# Patient Record
Sex: Male | Born: 1960 | Race: White | Hispanic: No | Marital: Married | Smoking: Former smoker
Health system: Southern US, Community
[De-identification: ages and names within clinical notes are randomized; demographics above are authoritative.]

## PROBLEM LIST (undated history)

## (undated) DIAGNOSIS — K222 Esophageal obstruction: Secondary | ICD-10-CM

## (undated) DIAGNOSIS — Z8249 Family history of ischemic heart disease and other diseases of the circulatory system: Secondary | ICD-10-CM

## (undated) DIAGNOSIS — N200 Calculus of kidney: Secondary | ICD-10-CM

## (undated) DIAGNOSIS — J309 Allergic rhinitis, unspecified: Secondary | ICD-10-CM

## (undated) DIAGNOSIS — I5189 Other ill-defined heart diseases: Secondary | ICD-10-CM

## (undated) DIAGNOSIS — Z87442 Personal history of urinary calculi: Secondary | ICD-10-CM

## (undated) DIAGNOSIS — M1990 Unspecified osteoarthritis, unspecified site: Secondary | ICD-10-CM

## (undated) DIAGNOSIS — G4733 Obstructive sleep apnea (adult) (pediatric): Secondary | ICD-10-CM

## (undated) DIAGNOSIS — K649 Unspecified hemorrhoids: Secondary | ICD-10-CM

## (undated) DIAGNOSIS — K922 Gastrointestinal hemorrhage, unspecified: Secondary | ICD-10-CM

## (undated) DIAGNOSIS — K219 Gastro-esophageal reflux disease without esophagitis: Secondary | ICD-10-CM

## (undated) DIAGNOSIS — I471 Supraventricular tachycardia, unspecified: Secondary | ICD-10-CM

## (undated) DIAGNOSIS — T7840XA Allergy, unspecified, initial encounter: Secondary | ICD-10-CM

## (undated) DIAGNOSIS — I10 Essential (primary) hypertension: Secondary | ICD-10-CM

## (undated) DIAGNOSIS — K5790 Diverticulosis of intestine, part unspecified, without perforation or abscess without bleeding: Secondary | ICD-10-CM

## (undated) DIAGNOSIS — I77810 Thoracic aortic ectasia: Secondary | ICD-10-CM

## (undated) DIAGNOSIS — I4719 Other supraventricular tachycardia: Secondary | ICD-10-CM

## (undated) DIAGNOSIS — Z87891 Personal history of nicotine dependence: Secondary | ICD-10-CM

## (undated) DIAGNOSIS — I499 Cardiac arrhythmia, unspecified: Secondary | ICD-10-CM

## (undated) DIAGNOSIS — E785 Hyperlipidemia, unspecified: Secondary | ICD-10-CM

## (undated) DIAGNOSIS — G4730 Sleep apnea, unspecified: Secondary | ICD-10-CM

## (undated) DIAGNOSIS — I2510 Atherosclerotic heart disease of native coronary artery without angina pectoris: Secondary | ICD-10-CM

## (undated) DIAGNOSIS — M1380 Other specified arthritis, unspecified site: Secondary | ICD-10-CM

## (undated) DIAGNOSIS — E119 Type 2 diabetes mellitus without complications: Secondary | ICD-10-CM

## (undated) DIAGNOSIS — I219 Acute myocardial infarction, unspecified: Secondary | ICD-10-CM

## (undated) DIAGNOSIS — I4891 Unspecified atrial fibrillation: Secondary | ICD-10-CM

## (undated) DIAGNOSIS — K635 Polyp of colon: Secondary | ICD-10-CM

## (undated) HISTORY — DX: Unspecified osteoarthritis, unspecified site: M19.90

## (undated) HISTORY — DX: Other ill-defined heart diseases: I51.89

## (undated) HISTORY — DX: Allergic rhinitis, unspecified: J30.9

## (undated) HISTORY — PX: BARIATRIC SURGERY: SHX1103

## (undated) HISTORY — DX: Obstructive sleep apnea (adult) (pediatric): G47.33

## (undated) HISTORY — DX: Thoracic aortic ectasia: I77.810

## (undated) HISTORY — DX: Family history of ischemic heart disease and other diseases of the circulatory system: Z82.49

## (undated) HISTORY — DX: Unspecified hemorrhoids: K64.9

## (undated) HISTORY — DX: Hyperlipidemia, unspecified: E78.5

## (undated) HISTORY — DX: Morbid (severe) obesity due to excess calories: E66.01

## (undated) HISTORY — DX: Diverticulosis of intestine, part unspecified, without perforation or abscess without bleeding: K57.90

## (undated) HISTORY — DX: Personal history of nicotine dependence: Z87.891

## (undated) HISTORY — DX: Gastro-esophageal reflux disease without esophagitis: K21.9

## (undated) HISTORY — DX: Type 2 diabetes mellitus without complications: E11.9

## (undated) HISTORY — DX: Polyp of colon: K63.5

## (undated) HISTORY — PX: CARPAL TUNNEL RELEASE: SHX101

## (undated) HISTORY — DX: Calculus of kidney: N20.0

## (undated) HISTORY — DX: Atherosclerotic heart disease of native coronary artery without angina pectoris: I25.10

## (undated) HISTORY — DX: Allergy, unspecified, initial encounter: T78.40XA

## (undated) HISTORY — DX: Supraventricular tachycardia: I47.1

## (undated) HISTORY — DX: Supraventricular tachycardia, unspecified: I47.10

## (undated) HISTORY — DX: Sleep apnea, unspecified: G47.30

## (undated) HISTORY — PX: MOUTH SURGERY: SHX715

## (undated) HISTORY — PX: CHOLECYSTECTOMY: SHX55

## (undated) HISTORY — PX: TONSILLECTOMY: SHX5217

## (undated) HISTORY — DX: Other specified arthritis, unspecified site: M13.80

---

## 1997-08-25 HISTORY — PX: HEMORRHOID SURGERY: SHX153

## 2002-04-21 ENCOUNTER — Encounter: Payer: Self-pay | Admitting: Internal Medicine

## 2002-04-21 ENCOUNTER — Emergency Department (HOSPITAL_COMMUNITY): Admission: EM | Admit: 2002-04-21 | Discharge: 2002-04-21 | Payer: Self-pay | Admitting: Internal Medicine

## 2005-06-27 ENCOUNTER — Ambulatory Visit: Payer: Self-pay | Admitting: Gastroenterology

## 2005-06-27 HISTORY — PX: COLONOSCOPY: SHX174

## 2005-06-27 HISTORY — PX: ESOPHAGOGASTRODUODENOSCOPY: SHX1529

## 2005-10-01 ENCOUNTER — Ambulatory Visit: Payer: Self-pay | Admitting: Orthopedic Surgery

## 2008-02-24 ENCOUNTER — Ambulatory Visit: Payer: Self-pay | Admitting: General Practice

## 2008-03-25 ENCOUNTER — Ambulatory Visit: Payer: Self-pay | Admitting: General Practice

## 2008-08-08 ENCOUNTER — Emergency Department (HOSPITAL_COMMUNITY): Admission: EM | Admit: 2008-08-08 | Discharge: 2008-08-08 | Payer: Self-pay | Admitting: Emergency Medicine

## 2008-08-31 ENCOUNTER — Emergency Department (HOSPITAL_COMMUNITY): Admission: EM | Admit: 2008-08-31 | Discharge: 2008-08-31 | Payer: Self-pay | Admitting: Emergency Medicine

## 2008-09-08 ENCOUNTER — Ambulatory Visit: Payer: Self-pay

## 2008-12-12 ENCOUNTER — Ambulatory Visit: Payer: Self-pay | Admitting: Family Medicine

## 2009-05-02 ENCOUNTER — Emergency Department: Payer: Self-pay | Admitting: Emergency Medicine

## 2009-11-24 ENCOUNTER — Emergency Department (HOSPITAL_COMMUNITY): Admission: EM | Admit: 2009-11-24 | Discharge: 2009-11-24 | Payer: Self-pay | Admitting: Emergency Medicine

## 2009-11-28 ENCOUNTER — Ambulatory Visit (HOSPITAL_COMMUNITY): Admission: RE | Admit: 2009-11-28 | Discharge: 2009-11-28 | Payer: Self-pay | Admitting: Urology

## 2009-12-03 ENCOUNTER — Ambulatory Visit (HOSPITAL_COMMUNITY): Admission: RE | Admit: 2009-12-03 | Discharge: 2009-12-03 | Payer: Self-pay | Admitting: Urology

## 2009-12-13 ENCOUNTER — Ambulatory Visit: Payer: Self-pay

## 2010-03-22 ENCOUNTER — Ambulatory Visit: Payer: Self-pay

## 2010-03-25 ENCOUNTER — Ambulatory Visit: Payer: Self-pay

## 2010-04-01 ENCOUNTER — Ambulatory Visit: Payer: Self-pay | Admitting: Specialist

## 2010-04-25 ENCOUNTER — Ambulatory Visit: Payer: Self-pay | Admitting: Specialist

## 2010-08-01 ENCOUNTER — Ambulatory Visit: Payer: Self-pay | Admitting: Specialist

## 2010-11-13 LAB — URINE MICROSCOPIC-ADD ON

## 2010-11-13 LAB — URINALYSIS, ROUTINE W REFLEX MICROSCOPIC
Bilirubin Urine: NEGATIVE
Bilirubin Urine: NEGATIVE
Glucose, UA: NEGATIVE mg/dL
Ketones, ur: NEGATIVE mg/dL
Leukocytes, UA: NEGATIVE
Nitrite: NEGATIVE
Protein, ur: NEGATIVE mg/dL
Specific Gravity, Urine: >1.03 — ABNORMAL HIGH (ref 1.005–1.030)
Specific Gravity, Urine: >1.03 — ABNORMAL HIGH (ref 1.005–1.030)
Urobilinogen, UA: 0.2 mg/dL (ref 0.0–1.0)
pH: 6 (ref 5.0–8.0)
pH: 6 (ref 5.0–8.0)

## 2011-05-29 LAB — URINALYSIS, ROUTINE W REFLEX MICROSCOPIC
Ketones, ur: NEGATIVE mg/dL
Leukocytes, UA: NEGATIVE
Nitrite: NEGATIVE
Protein, ur: NEGATIVE mg/dL

## 2011-05-29 LAB — URINE MICROSCOPIC-ADD ON

## 2011-09-12 ENCOUNTER — Ambulatory Visit: Payer: Self-pay | Admitting: General Practice

## 2014-06-19 ENCOUNTER — Encounter (INDEPENDENT_AMBULATORY_CARE_PROVIDER_SITE_OTHER): Payer: Self-pay

## 2014-06-19 ENCOUNTER — Ambulatory Visit (INDEPENDENT_AMBULATORY_CARE_PROVIDER_SITE_OTHER): Payer: 59 | Admitting: Cardiovascular Disease

## 2014-06-19 ENCOUNTER — Encounter: Payer: Self-pay | Admitting: Cardiovascular Disease

## 2014-06-19 VITALS — BP 140/90 | HR 93 | Ht 74.0 in | Wt 324.2 lb

## 2014-06-19 DIAGNOSIS — E669 Obesity, unspecified: Secondary | ICD-10-CM | POA: Insufficient documentation

## 2014-06-19 DIAGNOSIS — E785 Hyperlipidemia, unspecified: Secondary | ICD-10-CM | POA: Insufficient documentation

## 2014-06-19 DIAGNOSIS — R0602 Shortness of breath: Secondary | ICD-10-CM | POA: Insufficient documentation

## 2014-06-19 DIAGNOSIS — G4733 Obstructive sleep apnea (adult) (pediatric): Secondary | ICD-10-CM

## 2014-06-19 DIAGNOSIS — R079 Chest pain, unspecified: Secondary | ICD-10-CM | POA: Insufficient documentation

## 2014-06-19 DIAGNOSIS — Z9989 Dependence on other enabling machines and devices: Secondary | ICD-10-CM

## 2014-06-19 NOTE — Assessment & Plan Note (Signed)
History of sleep apnea, compliant on his CPAP

## 2014-06-19 NOTE — Patient Instructions (Addendum)
No medication changes were made.  Your EKG and exam are good today!  We will order a stress test for chest pain and shortness of breath No caffeine  For 24 hours before the test No food the morning of the test  [facility] MYOVIEW  Your caregiver has ordered a Stress Test with nuclear imaging. The purpose of this test is to evaluate the blood supply to your heart muscle. This procedure is referred to as a "Non-Invasive Stress Test." This is because other than having an IV started in your vein, nothing is inserted or "invades" your body. Cardiac stress tests are done to find areas of poor blood flow to the heart by determining the extent of coronary artery disease (CAD). Some patients exercise on a treadmill, which naturally increases the blood flow to your heart, while others who are  unable to walk on a treadmill due to physical limitations have a pharmacologic/chemical stress agent called Lexiscan . This medicine will mimic walking on a treadmill by temporarily increasing your coronary blood flow.   Please note: these test may take anywhere between 2-4 hours to complete  PLEASE REPORT TO [facility]  THE VOLUNTEERS AT THE FIRST DESK WILL DIRECT YOU WHERE TO GO  Date of Procedure:06/27/14 and 11/4/15_____________________________________  Arrival Time for Procedure:_____________7:45 am_________________  PLEASE NOTIFY THE OFFICE AT LEAST 24 HOURS IN ADVANCE IF YOU ARE UNABLE TO KEEP YOUR APPOINTMENT.  [phone] AND  PLEASE NOTIFY NUCLEAR MEDICINE AT [facility] AT LEAST 24 HOURS IN ADVANCE IF YOU ARE UNABLE TO KEEP YOUR APPOINTMENT. [phone]  How to prepare for your Myoview test:  1. Do not eat or drink after midnight 2. No caffeine for 24 hours prior to test 3. No smoking 24 hours prior to test. 4. Your medication may be taken with water.  If your doctor stopped a medication because of this test, do not take that medication. 5. Ladies, please do not wear dresses.  Skirts or  pants are appropriate. Please wear a short sleeve shirt. 6. No perfume, cologne or lotion. 7. Wear comfortable walking shoes. No heels!            Please call us if you have new issues that need to be addressed before your next appt.   Your next appointment will be scheduled in our new office located at :  [facility]  [address], [address]  [place], [place] [id number]

## 2014-06-19 NOTE — Assessment & Plan Note (Signed)
We have encouraged continued exercise, careful diet management in an effort to lose weight. 

## 2014-06-19 NOTE — Assessment & Plan Note (Signed)
Etiology unclear. Some typical and atypical features. We will order a Myoview as he is unable to walk very far secondary to arthritis pain. He will need a 2 day study. High risk of coronary artery disease given his smoking history, significant family history

## 2014-06-19 NOTE — Progress Notes (Signed)
Patient ID: Hector BolognaGary K Curry, male    DOB: May 29, 1961, 53 y.o.   MRN: [id number]  HPI Comments: Hector Curry is a pleasant 53 year old male with history of obesity, LAP-BAND 3 years ago, obstructive sleep apnea who wears CPAP, 15 years of smoking who stopped several years ago, strong family history of coronary artery disease with father who passed away from MI in his late 5940s, who presents to establish care in the [place].  He reports that he has had chest pain. Symptoms typically presenting at nighttime, sometimes in the day. Symptoms are rare but will wake him up from sleep. Symptoms will last for quite some time, typically resolve after getting up, taking an aspirin. He has not been exercising secondary to chronic joint issues. No radiation of his pain to arms or jaw. He spends most of his time working, sitting, works for [employer]. He does report shortness of breath with exertion. Reports that since the LAP band and since he stopped smoking,  his weight has climbed significantly, possibly 100 pounds  5-7 years ago he reports having a stress test and echocardiogram which were reportedly normal He does not check his blood pressure on a regular basis Lab work from 2012 showed LDL 139, HDL 31, triglycerides 156, total cholesterol 201, vitamin D was low No recent lab work since his weight has trended upwards  EKG shows normal sinus rhythm with rate 93 beats per minute, no significant ST or T wave changes   Outpatient Encounter Prescriptions as of 06/19/2014  Medication Sig  . aspirin 81 MG tablet Take 81 mg by mouth daily.  . cholecalciferol (VITAMIN D) 1000 UNITS tablet Take 1,000 Units by mouth daily.  . Omega-3 Fatty Acids (FISH OIL) 1000 MG CAPS Take 1,000 mg by mouth daily.  . vitamin E 400 UNIT capsule Take 400 Units by mouth daily.    Review of Systems  Constitutional: Negative.   HENT: Negative.   Eyes: Negative.   Respiratory: Positive for shortness of breath.    Cardiovascular: Positive for chest pain.  Gastrointestinal: Negative.   Endocrine: Negative.   Musculoskeletal: Negative.   Skin: Negative.   Allergic/Immunologic: Negative.   Neurological: Negative.   Hematological: Negative.   Psychiatric/Behavioral: Negative.   All other systems reviewed and are negative. BP 140/90  Pulse 93  Ht 6\' 2"  (1.88 m)  Wt 324 lb 4 oz (147.079 kg)  BMI 41.61 kg/m2  Physical Exam  Nursing note and vitals reviewed. Constitutional: He is oriented to person, place, and time. He appears well-developed and well-nourished.  HENT:  Head: Normocephalic.  Nose: Nose normal.  Mouth/Throat: Oropharynx is clear and moist.  Eyes: Conjunctivae are normal. Pupils are equal, round, and reactive to light.  Neck: Normal range of motion. Neck supple. No JVD present.  Cardiovascular: Normal rate, regular rhythm, S1 normal, S2 normal, normal heart sounds and intact distal pulses.  Exam reveals no gallop and no friction rub.   No murmur heard. Pulmonary/Chest: Effort normal and breath sounds normal. No respiratory distress. He has no wheezes. He has no rales. He exhibits no tenderness.  Abdominal: Soft. Bowel sounds are normal. He exhibits no distension. There is no tenderness.  Musculoskeletal: Normal range of motion. He exhibits no edema and no tenderness.  Lymphadenopathy:    He has no cervical adenopathy.  Neurological: He is alert and oriented to person, place, and time. Coordination normal.  Skin: Skin is warm and dry. No rash noted. No erythema.  Psychiatric: He has a  normal mood and affect. His behavior is normal. Judgment and thought content normal.      Assessment and Plan

## 2014-06-19 NOTE — Assessment & Plan Note (Signed)
Shortness of breath with exertion. Stress test has been ordered. Unable to exclude ischemia versus deconditioning, obesity.

## 2014-06-19 NOTE — Assessment & Plan Note (Signed)
Cholesterol has likely climbed since 2012 given his weight gain. He is scheduled to see Dr. Dossie Arbourrissman for routine exam and lab work. We'll try to obtain this lab work when it becomes available. He may need a statin

## 2014-06-27 ENCOUNTER — Ambulatory Visit: Payer: Self-pay | Admitting: Cardiovascular Disease

## 2014-06-27 DIAGNOSIS — R079 Chest pain, unspecified: Secondary | ICD-10-CM

## 2014-06-28 ENCOUNTER — Encounter: Payer: Self-pay | Admitting: Cardiovascular Disease

## 2014-06-28 ENCOUNTER — Other Ambulatory Visit: Payer: Self-pay | Admitting: *Deleted

## 2014-06-28 DIAGNOSIS — R079 Chest pain, unspecified: Secondary | ICD-10-CM

## 2014-06-28 DIAGNOSIS — R0602 Shortness of breath: Secondary | ICD-10-CM

## 2014-08-07 DIAGNOSIS — I10 Essential (primary) hypertension: Secondary | ICD-10-CM | POA: Insufficient documentation

## 2014-10-03 ENCOUNTER — Ambulatory Visit: Payer: Self-pay | Admitting: Gastroenterology

## 2014-10-03 DIAGNOSIS — K635 Polyp of colon: Secondary | ICD-10-CM

## 2014-10-03 DIAGNOSIS — K5790 Diverticulosis of intestine, part unspecified, without perforation or abscess without bleeding: Secondary | ICD-10-CM | POA: Insufficient documentation

## 2014-10-03 HISTORY — PX: COLONOSCOPY: SHX174

## 2014-10-03 HISTORY — DX: Polyp of colon: K63.5

## 2014-10-03 HISTORY — DX: Diverticulosis of intestine, part unspecified, without perforation or abscess without bleeding: K57.90

## 2014-10-20 DIAGNOSIS — M65839 Other synovitis and tenosynovitis, unspecified forearm: Secondary | ICD-10-CM | POA: Insufficient documentation

## 2014-12-18 LAB — SURGICAL PATHOLOGY

## 2015-04-26 ENCOUNTER — Emergency Department (HOSPITAL_COMMUNITY): Payer: 59

## 2015-04-26 ENCOUNTER — Encounter (HOSPITAL_COMMUNITY): Payer: Self-pay | Admitting: Emergency Medicine

## 2015-04-26 ENCOUNTER — Emergency Department (HOSPITAL_COMMUNITY)
Admission: EM | Admit: 2015-04-26 | Discharge: 2015-04-26 | Disposition: A | Discharge status: Home / Self Care | Payer: 59 | Attending: Emergency Medicine | Admitting: Emergency Medicine

## 2015-04-26 ENCOUNTER — Telehealth: Payer: Self-pay

## 2015-04-26 DIAGNOSIS — I498 Other specified cardiac arrhythmias: Secondary | ICD-10-CM

## 2015-04-26 DIAGNOSIS — I4891 Unspecified atrial fibrillation: Secondary | ICD-10-CM | POA: Insufficient documentation

## 2015-04-26 DIAGNOSIS — E669 Obesity, unspecified: Secondary | ICD-10-CM | POA: Diagnosis not present

## 2015-04-26 DIAGNOSIS — Z79899 Other long term (current) drug therapy: Secondary | ICD-10-CM | POA: Diagnosis not present

## 2015-04-26 DIAGNOSIS — I10 Essential (primary) hypertension: Secondary | ICD-10-CM | POA: Insufficient documentation

## 2015-04-26 DIAGNOSIS — Z87891 Personal history of nicotine dependence: Secondary | ICD-10-CM | POA: Diagnosis not present

## 2015-04-26 DIAGNOSIS — R0789 Other chest pain: Secondary | ICD-10-CM | POA: Diagnosis not present

## 2015-04-26 DIAGNOSIS — Z7982 Long term (current) use of aspirin: Secondary | ICD-10-CM | POA: Insufficient documentation

## 2015-04-26 DIAGNOSIS — R200 Anesthesia of skin: Secondary | ICD-10-CM

## 2015-04-26 DIAGNOSIS — Z8639 Personal history of other endocrine, nutritional and metabolic disease: Secondary | ICD-10-CM | POA: Diagnosis not present

## 2015-04-26 DIAGNOSIS — R002 Palpitations: Secondary | ICD-10-CM

## 2015-04-26 DIAGNOSIS — R079 Chest pain, unspecified: Secondary | ICD-10-CM | POA: Diagnosis present

## 2015-04-26 HISTORY — DX: Essential (primary) hypertension: I10

## 2015-04-26 LAB — BASIC METABOLIC PANEL
Anion gap: 9 (ref 5–15)
BUN: 11 mg/dL (ref 6–20)
CHLORIDE: 97 mmol/L — AB (ref 101–111)
CO2: 29 mmol/L (ref 22–32)
Calcium: 8.8 mg/dL — ABNORMAL LOW (ref 8.9–10.3)
Creatinine, Ser: 0.86 mg/dL (ref 0.61–1.24)
GFR calc Af Amer: >60 mL/min (ref >60)
GFR calc non Af Amer: >60 mL/min (ref >60)
GLUCOSE: 154 mg/dL — AB (ref 65–99)
POTASSIUM: 3.4 mmol/L — AB (ref 3.5–5.1)
SODIUM: 135 mmol/L (ref 135–145)

## 2015-04-26 LAB — CBC
HEMATOCRIT: 47.2 % (ref 39.0–52.0)
Hemoglobin: 16.2 g/dL (ref 13.0–17.0)
MCH: 30.8 pg (ref 26.0–34.0)
MCHC: 34.3 g/dL (ref 30.0–36.0)
MCV: 89.7 fL (ref 78.0–100.0)
Platelets: 171 10*3/uL (ref 150–400)
RBC: 5.26 MIL/uL (ref 4.22–5.81)
RDW: 13.7 % (ref 11.5–15.5)
WBC: 7.7 10*3/uL (ref 4.0–10.5)

## 2015-04-26 LAB — TROPONIN I
Troponin I: <0.03 ng/mL (ref <0.031)
Troponin I: <0.03 ng/mL (ref <0.031)

## 2015-04-26 MED ORDER — ASPIRIN 81 MG PO CHEW
324.0000 mg | CHEWABLE_TABLET | Freq: Once | ORAL | Status: DC
  Filled 2015-04-26: qty 4

## 2015-04-26 MED ORDER — SODIUM CHLORIDE 0.9 % IV SOLN
INTRAVENOUS | Status: AC
  Filled 2015-04-26: qty 150

## 2015-04-26 MED ORDER — SODIUM CHLORIDE 0.9 % IJ SOLN
INTRAMUSCULAR | Status: AC
  Filled 2015-04-26: qty 15

## 2015-04-26 MED ORDER — ACETAMINOPHEN 500 MG PO TABS
1000.0000 mg | ORAL_TABLET | Freq: Once | ORAL | Status: AC
  Administered 2015-04-26: 1000 mg via ORAL
  Filled 2015-04-26: qty 2

## 2015-04-26 MED ORDER — NITROGLYCERIN 2 % TD OINT
1.0000 [in_us] | TOPICAL_OINTMENT | Freq: Once | TRANSDERMAL | Status: AC
  Administered 2015-04-26: 1 [in_us] via TOPICAL
  Filled 2015-04-26: qty 1

## 2015-04-26 NOTE — ED Provider Notes (Signed)
CSN: [id number]     Arrival date & time 04/26/15  0458 History   First MD Initiated Contact with Patient 04/26/15 0515    Chief Complaint  Patient presents with  . Chest Pain     (Consider location/radiation/quality/duration/timing/severity/associated sxs/prior Treatment) HPI  Patient reports she has had a constant feeling that his heart is fluttering for the past 2 weeks. He states it was fluttering when he went to bed last night at 9 PM. He denies any chest pain, shortness of breath, lightheadedness or dizziness with the fluttering. He states at 4:30 this morning while making the bed which is his usual activity he started getting chest discomfort just left of the center of his chest that lasted a few minutes. He had numbness and tingling in his left arm during that same time. He has a second episode about 15 minutes later and got clammy and felt very weak. He had a third episode while driving to the emergency department. He denies any nausea, vomiting, shortness of breath with these episodes. He has not had  had these episodes before. He states he sees a cardiologist for control of his blood pressure, however he does not know what his blood pressure medication is. He states he has a family history of heart disease and is father who died at age 3 from heart disease.  PCP Dr Judithann Sheen, [facility] Dr Lewie Loron with [place] for hypertension  Past Medical History  Diagnosis Date  . Hyperlipidemia   . Hypertension   . CPAP (continuous positive airway pressure) dependence    Past Surgical History  Procedure Laterality Date  . Bariatric surgery      lap band   . Cholecystectomy     Family History  Problem Relation Age of Onset  . Heart attack Father 41  . Hypertension Sister    Social History  Substance Use Topics  . Smoking status: Former Smoker -- 1.00 packs/day for 15 years    Types: Cigarettes    Quit date: 08/05/1989  . Smokeless tobacco: None  . Alcohol Use: 0.6  oz/week    1 Glasses of wine per week  employed Lives at home Lives with spouse  Review of Systems  All other systems reviewed and are negative.     Allergies  Review of patient's allergies indicates no known allergies.  Home Medications   Prior to Admission medications   Medication Sig Start Date End Date Taking? Authorizing Provider  bisoprolol (ZEBETA) 10 MG tablet Take 20 mg by mouth daily.   Yes Historical Provider, MD  aspirin 81 MG tablet Take 81 mg by mouth daily.    Historical Provider, MD  cholecalciferol (VITAMIN D) 1000 UNITS tablet Take 1,000 Units by mouth daily.    Historical Provider, MD  Omega-3 Fatty Acids (FISH OIL) 1000 MG CAPS Take 1,000 mg by mouth daily.    Historical Provider, MD  vitamin E 400 UNIT capsule Take 400 Units by mouth daily.    Historical Provider, MD   BP 130/90 mmHg  Pulse 74  Temp(Src) 97.6 F (36.4 C)  Resp 19  Ht  (1.88 m)  Wt 320 lb (145.151 kg)  BMI 41.07 kg/m2  SpO2 99%  Vital signs normal   Physical Exam  Constitutional: He is oriented to person, place, and time. He appears well-developed and well-nourished.  Non-toxic appearance. He does not appear ill. No distress.  obese  HENT:  Head: Normocephalic and atraumatic.  Right Ear: External ear normal.  Left  Ear: External ear normal.  Nose: Nose normal. No mucosal edema or rhinorrhea.  Mouth/Throat: Oropharynx is clear and moist and mucous membranes are normal. No dental abscesses or uvula swelling.  Eyes: Conjunctivae and EOM are normal. Pupils are equal, round, and reactive to light.  Neck: Normal range of motion and full passive range of motion without pain. Neck supple.  Cardiovascular: Normal rate, regular rhythm and normal heart sounds.  Exam reveals no gallop and no friction rub.   No murmur heard. Pulmonary/Chest: Effort normal and breath sounds normal. No respiratory distress. He has no wheezes. He has no rhonchi. He has no rales. He exhibits no tenderness and  no crepitus.  Abdominal: Soft. Normal appearance and bowel sounds are normal. He exhibits no distension. There is no tenderness. There is no rebound and no guarding.  Musculoskeletal: Normal range of motion. He exhibits no edema or tenderness.  Moves all extremities well.   Neurological: He is alert and oriented to person, place, and time. He has normal strength. No cranial nerve deficit.  Skin: Skin is warm, dry and intact. No rash noted. No erythema. No pallor.  Psychiatric: He has a normal mood and affect. His speech is normal and behavior is normal. His mood appears not anxious.  Nursing note and vitals reviewed.   ED Course  Procedures (including critical care time)  Medications  aspirin chewable tablet 324 mg (0 mg Oral Hold 04/26/15 0540)  sodium chloride 0.9 % infusion (not administered)  sodium chloride 0.9 % injection (not administered)  nitroGLYCERIN (NITROGLYN) 2 % ointment 1 inch (1 inch Topical Given 04/26/15 0534)  acetaminophen (TYLENOL) tablet 1,000 mg (1,000 mg Oral Given 04/26/15 0534)    Recheck at 6:45 AM. Wife states patient seems more like his normal self. He now states he has been having some intermittent episodes of numbness that starts in the left side of his head and goes down his left body with the chest discomfort he was telling me about. He states the chest comfort now was more of a dull pain. Patient is agreeable to getting MRI/MRA. His second troponin will be done at 9 AM which will be a 5 hour delta troponin. He states he continues to have "fluttering" in his chest that he states is constant. He is noted to have isolated PACs on his monitor.  8:00 am patient turned over to Dr Rubin Payor at change of shift to get delta troponin and his MRA/MRI results.   Labs Review Results for orders placed or performed during the hospital encounter of 04/26/15  Basic metabolic panel  Result Value Ref Range   Sodium 135 135 - 145 mmol/L   Potassium 3.4 (L) 3.5 - 5.1 mmol/L    Chloride 97 (L) 101 - 111 mmol/L   CO2 29 22 - 32 mmol/L   Glucose, Bld 154 (H) 65 - 99 mg/dL   BUN 11 6 - 20 mg/dL   Creatinine, Ser 9.56 0.61 - 1.24 mg/dL   Calcium 8.8 (L) 8.9 - 10.3 mg/dL   GFR calc non Af Amer >60 >60 mL/min   GFR calc Af Amer >60 >60 mL/min   Anion gap 9 5 - 15  CBC  Result Value Ref Range   WBC 7.7 4.0 - 10.5 K/uL   RBC 5.26 4.22 - 5.81 MIL/uL   Hemoglobin 16.2 13.0 - 17.0 g/dL   HCT 21.3 08.6 - 57.8 %   MCV 89.7 78.0 - 100.0 fL   MCH 30.8 26.0 - 34.0 pg  MCHC 34.3 30.0 - 36.0 g/dL   RDW 16.1 09.6 - 04.5 %   Platelets 171 150 - 400 K/uL  Troponin I  Result Value Ref Range   Troponin I <0.03 <0.031 ng/mL   Laboratory interpretation all normal except mild hypokalemia    Imaging Review Dg Chest 2 View  04/26/2015   CLINICAL DATA:  Chest pain and dyspnea onset this morning  EXAM: CHEST  2 VIEW  COMPARISON:  09/12/2011  FINDINGS: The heart size and mediastinal contours are within normal limits. Both lungs are clear. The visualized skeletal structures are unremarkable.  IMPRESSION: No active cardiopulmonary disease.   Electronically Signed   By: Ellery Plunk M.D.   On: 04/26/2015 06:37   I have personally reviewed and evaluated these images and lab results as part of my medical decision-making.   EKG Interpretation   Date/Time:  Thursday April 26 2015 40:98:11 EDT Ventricular Rate:  82 PR Interval:  182 QRS Duration: 131 QT Interval:  412 QTC Calculation: 481 R Axis:   94 Text Interpretation:  Sinus rhythm Supraventricular bigeminy Nonspecific  intraventricular conduction delay No significant change since last tracing  25 Mar 2010 Confirmed by [facility]  MD-I, Eilam Shrewsbury ([id number]) on 04/26/2015 5:24:06 AM      MDM   Final diagnoses:  Numbness on left side  Chest pain, atypical  Fluttering heart    disposition pending  Devoria Albe, MD, Concha Pyo, MD 04/26/15 [phone]

## 2015-04-26 NOTE — ED Notes (Signed)
Pt c/o chest tightness that radiates down left arm with dizziness and weakness.

## 2015-04-26 NOTE — Discharge Instructions (Signed)
Paresthesia Paresthesia is an abnormal burning or prickling sensation. This sensation is generally felt in the hands, arms, legs, or feet. However, it may occur in any part of the body. It is usually not painful. The feeling may be described as:  Tingling or numbness.  "Pins and needles."  Skin crawling.  Buzzing.  Limbs "falling asleep."  Itching. Most people experience temporary (transient) paresthesia at some time in their lives. CAUSES  Paresthesia may occur when you breathe too quickly (hyperventilation). It can also occur without any apparent cause. Commonly, paresthesia occurs when pressure is placed on a nerve. The feeling quickly goes away once the pressure is removed. For some people, however, paresthesia is a long-lasting (chronic) condition caused by an underlying disorder. The underlying disorder may be:  A traumatic, direct injury to nerves. Examples include a:  Broken (fractured) neck.  Fractured skull.  A disorder affecting the brain and spinal cord (central nervous system). Examples include:  Transverse myelitis.  Encephalitis.  Transient ischemic attack.  Multiple sclerosis.  Stroke.  Tumor or blood vessel problems, such as an arteriovenous malformation pressing against the brain or spinal cord.  A condition that damages the peripheral nerves (peripheral neuropathy). Peripheral nerves are not part of the brain and spinal cord. These conditions include:  Diabetes.  Peripheral vascular disease.  Nerve entrapment syndromes, such as carpal tunnel syndrome.  Shingles.  Hypothyroidism.  Vitamin B12 deficiencies.  Alcoholism.  Heavy metal poisoning (lead, arsenic).  Rheumatoid arthritis.  Systemic lupus erythematosus. DIAGNOSIS  Your caregiver will attempt to find the underlying cause of your paresthesia. Your caregiver may:  Take your medical history.  Perform a physical exam.  Order various lab tests.  Order imaging tests. TREATMENT    Treatment for paresthesia depends on the underlying cause. HOME CARE INSTRUCTIONS  Avoid drinking alcohol.  You may consider massage or acupuncture to help relieve your symptoms.  Keep all follow-up appointments as directed by your caregiver. SEEK IMMEDIATE MEDICAL CARE IF:   You feel weak.  You have trouble walking or moving.  You have problems with speech or vision.  You feel confused.  You cannot control your bladder or bowel movements.  You feel numbness after an injury.  You faint.  Your burning or prickling feeling gets worse when walking.  You have pain, cramps, or dizziness.  You develop a rash. MAKE SURE YOU:  Understand these instructions.  Will watch your condition.  Will get help right away if you are not doing well or get worse. Document Released: 08/01/2002 Document Revised: 11/03/2011 Document Reviewed: 05/02/2011 [facility] Patient Information 2015 [place], [place]. This information is not intended to replace advice given to you by your health care provider. Make sure you discuss any questions you have with your health care provider. Palpitations A palpitation is the feeling that your heartbeat is irregular or is faster than normal. It may feel like your heart is fluttering or skipping a beat. Palpitations are usually not a serious problem. However, in some cases, you may need further medical evaluation. CAUSES  Palpitations can be caused by:  Smoking.  Caffeine or other stimulants, such as diet pills or energy drinks.  Alcohol.  Stress and anxiety.  Strenuous physical activity.  Fatigue.  Certain medicines.  Heart disease, especially if you have a history of irregular heart rhythms (arrhythmias), such as atrial fibrillation, atrial flutter, or supraventricular tachycardia.  An improperly working pacemaker or defibrillator. DIAGNOSIS  To find the cause of your palpitations, your health care provider will  take your medical history and perform  a physical exam. Your health care provider may also have you take a test called an ambulatory electrocardiogram (ECG). An ECG records your heartbeat patterns over a 24-hour period. You may also have other tests, such as:  Transthoracic echocardiogram (TTE). During echocardiography, sound waves are used to evaluate how blood flows through your heart.  Transesophageal echocardiogram (TEE).  Cardiac monitoring. This allows your health care provider to monitor your heart rate and rhythm in real time.  Holter monitor. This is a portable device that records your heartbeat and can help diagnose heart arrhythmias. It allows your health care provider to track your heart activity for several days, if needed.  Stress tests by exercise or by giving medicine that makes the heart beat faster. TREATMENT  Treatment of palpitations depends on the cause of your symptoms and can vary greatly. Most cases of palpitations do not require any treatment other than time, relaxation, and monitoring your symptoms. Other causes, such as atrial fibrillation, atrial flutter, or supraventricular tachycardia, usually require further treatment. HOME CARE INSTRUCTIONS   Avoid:  Caffeinated coffee, tea, soft drinks, diet pills, and energy drinks.  Chocolate.  Alcohol.  Stop smoking if you smoke.  Reduce your stress and anxiety. Things that can help you relax include:  A method of controlling things in your body, such as your heartbeats, with your mind (biofeedback).  Yoga.  Meditation.  Physical activity such as swimming, jogging, or walking.  Get plenty of rest and sleep. SEEK MEDICAL CARE IF:   You continue to have a fast or irregular heartbeat beyond 24 hours.  Your palpitations occur more often. SEEK IMMEDIATE MEDICAL CARE IF:  You have chest pain or shortness of breath.  You have a severe headache.  You feel dizzy or you faint. MAKE SURE YOU:  Understand these instructions.  Will watch your  condition.  Will get help right away if you are not doing well or get worse. Document Released: 08/08/2000 Document Revised: 08/16/2013 Document Reviewed: 10/10/2011 [facility] Patient Information 2015 [place], [place]. This information is not intended to replace advice given to you by your health care provider. Make sure you discuss any questions you have with your health care provider.

## 2015-04-26 NOTE — Telephone Encounter (Signed)
Pt was seen this morning at [facility] ED with palpitations, chest heaviness, left side numb, light headed, dizzy. ED r/o heart attack and stroke per wife, states when pt tries to lay down his heart "races". Please call. ED informed him to call Dr. Mariah Milling.

## 2015-04-26 NOTE — ED Notes (Signed)
Asa not given because patient took asa prior  arrival  to er per patient which were  total

## 2015-04-26 NOTE — ED Provider Notes (Signed)
  Physical Exam  BP 117/63 mmHg  Pulse 70  Temp(Src) 97.6 F (36.4 C)  Resp 17  Ht  (1.88 m)  Wt 320 lb (145.151 kg)  BMI 41.07 kg/m2  SpO2 97%  Physical Exam  ED Course  Procedures  MDM Patient with palpitations and numbness. EKG reassuring enzymes negative 2. MRI does not show stroke. Will discharge home.      Benjiman Core, MD 04/26/15 [phone]

## 2015-04-26 NOTE — Telephone Encounter (Signed)
Where can we put him for an ED f/u?

## 2015-04-26 NOTE — Telephone Encounter (Signed)
EKG from the hospital reviewed showing APCs, typically a benign rhythm If he is concerned about other arrhythmia, could wear a 2 day monitor Would schedule in clinic, first available after monitor If heart is racing, would take extra beta blocker as needed He is currently taking beta blocker daily

## 2015-04-27 ENCOUNTER — Ambulatory Visit (INDEPENDENT_AMBULATORY_CARE_PROVIDER_SITE_OTHER): Payer: 59

## 2015-04-27 DIAGNOSIS — R002 Palpitations: Secondary | ICD-10-CM

## 2015-04-27 NOTE — Telephone Encounter (Signed)
S/w pt wife who states pt heart still fluttering and having difficulty sleeping.  Informed pt wife of Dr. Windell Hummingbird recommendations. Wife states she will call him about coming in today for holter monitor Wife verbalized understanding with no further questions.

## 2015-05-02 ENCOUNTER — Telehealth: Payer: Self-pay | Admitting: Cardiovascular Disease

## 2015-05-02 ENCOUNTER — Encounter: Payer: Self-pay | Admitting: Physician Assistant

## 2015-05-02 DIAGNOSIS — R002 Palpitations: Secondary | ICD-10-CM | POA: Insufficient documentation

## 2015-05-02 DIAGNOSIS — G4733 Obstructive sleep apnea (adult) (pediatric): Secondary | ICD-10-CM | POA: Insufficient documentation

## 2015-05-02 DIAGNOSIS — Z8249 Family history of ischemic heart disease and other diseases of the circulatory system: Secondary | ICD-10-CM | POA: Insufficient documentation

## 2015-05-02 DIAGNOSIS — Z87891 Personal history of nicotine dependence: Secondary | ICD-10-CM | POA: Insufficient documentation

## 2015-05-02 NOTE — Telephone Encounter (Signed)
Dr. Mariah Milling has not resulted to me yet w/ his recommendations.

## 2015-05-02 NOTE — Telephone Encounter (Signed)
1) Mandi - Please see resulted Holter to review with patient  2) Victorino Dike - Ok to place on schedule on 9/9 at 1:30

## 2015-05-02 NOTE — Telephone Encounter (Signed)
Thanks [employer], Will Fwd to nancy to open schedule and call patient to confirm.

## 2015-05-02 NOTE — Telephone Encounter (Signed)
Patient wants to know when to expect holter results.  Please call.    Patient also wants to be seen by [facility] ASAP .  Any suggestions?

## 2015-05-02 NOTE — Telephone Encounter (Signed)
He was in the ED for chest pain on 9/1.  Is there anywhere we can squeeze him in?

## 2015-05-02 NOTE — Telephone Encounter (Signed)
Hector Curry is unassigned on Friday so his schedule is blocked.  Will Fwd to him to see if he can fit in that day.. You think the 48hr holter will be resulted by then?  Ryan,  Can you see him on 9/9 ?  If so Which time is better for you ?

## 2015-05-02 NOTE — Telephone Encounter (Signed)
Thank you :)

## 2015-05-04 ENCOUNTER — Ambulatory Visit (INDEPENDENT_AMBULATORY_CARE_PROVIDER_SITE_OTHER): Payer: 59 | Admitting: Physician Assistant

## 2015-05-04 ENCOUNTER — Telehealth: Payer: Self-pay

## 2015-05-04 ENCOUNTER — Encounter: Payer: Self-pay | Admitting: Physician Assistant

## 2015-05-04 VITALS — BP 112/70 | HR 73 | Ht 74.0 in | Wt 328.5 lb

## 2015-05-04 DIAGNOSIS — K219 Gastro-esophageal reflux disease without esophagitis: Secondary | ICD-10-CM | POA: Insufficient documentation

## 2015-05-04 DIAGNOSIS — M1990 Unspecified osteoarthritis, unspecified site: Secondary | ICD-10-CM | POA: Insufficient documentation

## 2015-05-04 DIAGNOSIS — Z8249 Family history of ischemic heart disease and other diseases of the circulatory system: Secondary | ICD-10-CM | POA: Diagnosis not present

## 2015-05-04 DIAGNOSIS — G4733 Obstructive sleep apnea (adult) (pediatric): Secondary | ICD-10-CM

## 2015-05-04 DIAGNOSIS — R079 Chest pain, unspecified: Secondary | ICD-10-CM

## 2015-05-04 DIAGNOSIS — E781 Pure hyperglyceridemia: Secondary | ICD-10-CM

## 2015-05-04 DIAGNOSIS — R002 Palpitations: Secondary | ICD-10-CM | POA: Diagnosis not present

## 2015-05-04 DIAGNOSIS — K649 Unspecified hemorrhoids: Secondary | ICD-10-CM | POA: Insufficient documentation

## 2015-05-04 DIAGNOSIS — N200 Calculus of kidney: Secondary | ICD-10-CM | POA: Insufficient documentation

## 2015-05-04 DIAGNOSIS — Z87891 Personal history of nicotine dependence: Secondary | ICD-10-CM

## 2015-05-04 DIAGNOSIS — R42 Dizziness and giddiness: Secondary | ICD-10-CM | POA: Diagnosis not present

## 2015-05-04 DIAGNOSIS — E785 Hyperlipidemia, unspecified: Secondary | ICD-10-CM

## 2015-05-04 MED ORDER — NITROGLYCERIN 0.4 MG SL SUBL
0.4000 mg | SUBLINGUAL_TABLET | SUBLINGUAL | Status: DC | PRN
Start: 2015-05-04 — End: 2015-12-24

## 2015-05-04 MED ORDER — POTASSIUM CHLORIDE ER 20 MEQ PO TBCR: 20.0000 meq | EXTENDED_RELEASE_TABLET | Freq: Two times a day (BID) | ORAL | Status: DC

## 2015-05-04 MED ORDER — BISOPROLOL-HYDROCHLOROTHIAZIDE 10-6.25 MG PO TABS: 1.0000 | ORAL_TABLET | Freq: Every day | ORAL | Status: DC

## 2015-05-04 NOTE — Telephone Encounter (Signed)
S/w pt who states he is agreeable to a 2-day treadmill myoview Order placed

## 2015-05-04 NOTE — Progress Notes (Signed)
Cardiology Office Note:  Date of Encounter: 05/04/2015  ID: [name], DOB 11/03/1960, MRN [id number]  PCP:  Marguarite Arbour, MD Primary Cardiologist:  Dr. Mariah Milling, MD  Chief Complaint  Patient presents with  . other    Follow up from [facility] ER. Meds reviewed by the patient verbally. Pt. c/o dizziness, chest pain and irreg. heart beats.     HPI:  54 year old male with history of obesity s/p LAP-BAND, OSA on CPAP, prior tobacco abuse, morbid obesity, HLD, family history of premature CAD with father passing away from MI in his late 63's, and prior history of chest pain/SOB with typical and atypical features who returns to clinic today for ED follow up on 9/1 for chest pain, palpitations, and paresthesias.   Patient previously underwent stress testing and echocardiogram approximately 2008-2010 which were reportedly normal. He presented to [facility] 05/2014 to establish care with complaints of chest pain typically presenting at nighttime, sometimes during the day. Symptoms were rare, but would wake him up from sleep. He was not exercising 2/2 chronic joint issues. He underwent nuclear stress test in 06/2014 that showed no significant ischemia, no artifact was noted, EF 43%, no EKG changes, overall low risk scan.   He called 04/26/2015 stating he had been seen at [facility] ED with palpitations x 2 weeks. Also noted to have one day of chest heaviness, left sided numbness, and dizziness while making the bed that lasted just a few minutes. He had a second episode that lasted approximately 15 minutes and a third episode while driving to the ED. Troponin negative x 2, MRI head/MRA head negative for acute findings. CXR without acute cardiopulmonary disease. K+ 3.4. CBC unremarkable. He was discharged home from the ED based on the above findings. He had a follow up 48 hour Holter monitor per Dr. Mariah Milling post ED visit that showed NSR with rare PVC, short runs of narrow complex tachycardia,  possible atrial tachycardia, 7 beats longest run, PACs noted (2% of all beats, 3600 total), this did not seem to correlate with any significant arrythmia.   In follow up, he reports continued stuttering chest pain, palpitations, and dizziness. Symptoms occuring mainly when he is at rest or sleeping and wake him from a "good sleep." He last had an episode of chest pain while watching the Panthers vs Broncos Thursday night football game. Chest pain lasted from 10 Pm to 1 AM when he went to bed. Pain was associated with palpitations, and diaphoresis. He took 4 baby aspirin. He did not seek medical care at that time. Pain subsided upon going to bed. Pain does not come on when he is active or exerting himself. 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 Weight has been stable. No LEE, orthopnea, or early satiety. He has been taking all prescribed medications as directed.       Past Medical History  Diagnosis Date  . Hyperlipidemia   . Hypertension   . OSA (obstructive sleep apnea)     a. on CPAP  . Family history of premature CAD     a. father passed from MI at 51  . Morbid obesity     a. s/p LAP-BAND  . History of tobacco abuse   . Palpitations     a. 48 hr Holter 04/2015: NSR w/ rare PVC, short runs of narrow complex tachycardiac, possible atrial tach, longest run 7 beats, PACs noted (2%  of all beats 3600 total) they did not seem to correlate w/ significant arrythmia   . Kidney stones   . Allergic rhinitis   . Diverticulosis 10/03/2014  . GERD (gastroesophageal reflux disease)   . Hemorrhoids     a. internal hemorrhoids s/p surgery 1999  . Hyperplastic colon polyp 10/03/2014    a. x 2   . Inflammatory arthritis     a. CCP antibodies & x-rays negative. Rheumatoid factor 14, felt to be crystaline over RA or psoriatic  :  Past Surgical History  Procedure Laterality Date  . Bariatric surgery      lap band   . Cholecystectomy    . Tonsillectomy    .  Hemorrhoid surgery  1999  . Carpal tunnel release Left   . Colonoscopy  06/27/2005  . Colonoscopy  10/03/2014  . Esophagogastroduodenoscopy  06/27/2005  :  Social History:  The patient  reports that he quit smoking about 25 years ago. His smoking use included Cigarettes. He has a 15 pack-year smoking history. He does not have any smokeless tobacco history on file. He reports that he drinks about 0.6 oz of alcohol per week. He reports that he does not use illicit drugs.   Family History  Problem Relation Age of Onset  . Heart attack Father 78  . Hypertension Sister   . CAD Father   . Hypertension Mother   . Diabetes Mother   . Diabetes Other   . Hypertension Other   . Heart disease Other      Allergies:  No Known Allergies   Home Medications:  Current Outpatient Prescriptions  Medication Sig Dispense Refill  . aspirin 81 MG tablet Take 81 mg by mouth daily.    . bisoprolol-hydrochlorothiazide (ZIAC) 5-6.25 MG per tablet Take 1 tablet by mouth daily.    . cholecalciferol (VITAMIN D) 1000 UNITS tablet Take 2,000 Units by mouth 2 (two) times daily.     Marland Kitchen glucosamine-chondroitin 500-400 MG tablet Take 1 tablet by mouth 2 (two) times daily.     . Multiple Vitamin (MULTIVITAMIN WITH MINERALS) TABS tablet Take 1 tablet by mouth daily.    . Omega-3 Fatty Acids (FISH OIL) 1000 MG CAPS Take 1,000 mg by mouth 2 (two) times daily.     Marland Kitchen POTASSIUM PO Take by mouth.    . vitamin E 400 UNIT capsule Take 400 Units by mouth daily.     No current facility-administered medications for this visit.     Review of Systems:  Review of Systems  Constitutional: Positive for diaphoresis. Negative for fever, chills, weight loss and malaise/fatigue.  HENT: Negative for congestion.   Eyes: Negative for discharge and redness.  Respiratory: Negative for cough, hemoptysis, sputum production, shortness of breath and wheezing.   Cardiovascular: Positive for chest pain and palpitations. Negative for  orthopnea, claudication, leg swelling and PND.  Gastrointestinal: Negative for heartburn, nausea, vomiting and abdominal pain.  Musculoskeletal: Negative for myalgias and falls.  Skin: Negative for rash.  Neurological: Positive for dizziness. Negative for tingling, tremors, sensory change, speech change, loss of consciousness and weakness.  Endo/Heme/Allergies: Does not bruise/bleed easily.  Psychiatric/Behavioral: Negative for substance abuse. The patient is not nervous/anxious.   All other systems reviewed and are negative.    Physical Exam:  Blood pressure 112/70, height 6\' 2"  (1.88 m), weight 328 lb 8 oz (149.007 kg). BMI: Body mass index is 42.16 kg/(m^2). General: Pleasant, NAD. Psych: Normal affect. Responds to questions with normal affect.  Neuro: Alert  and oriented X 3. Moves all extremities spontaneously. HEENT: Normocephalic, atraumatic. EOM intact. Sclera anicteric.  Neck: Trachea midline. Supple without bruits or JVD. Lungs:  Respirations regular and unlabored. CTA bilaterally without wheezing, crackles, or rhonchi.  Heart: RRR, normal s3, s4. No murmurs, rubs, or gallops.  Abdomen: Obese, soft, non-tender, non-distended, BS + x 4.  Extremities: No clubbing or cyanosis. Trace bilateral lower extremity edema to the mid calves.   Accessory Clinical Findings:  EKG: NSR, 70 bpm, nonspecific intraventricular conduction delay, no significant st/t changes   Recent Labs: 04/26/2015: BUN 11; Creatinine, Ser 0.86; Hemoglobin 16.2; Platelets 171; Potassium 3.4*; Sodium 135  No results found for requested labs within last 365 days.  Estimated Creatinine Clearance: 154.8 mL/min (by C-G formula based on Cr of 0.86).  Weights: Wt Readings from Last 3 Encounters:  05/04/15 328 lb 8 oz (149.007 kg)  04/26/15 320 lb (145.151 kg)  04/26/15 320 lb (145.151 kg)    Other studies Reviewed: Additional studies/ records that were reviewed today include: prior office notes and ED  note.  Assessment & Plan:  1. Chest pain:  -Symptoms occuring mostly when patient is at rest, watching football game, or sleeping -Schedule Treadmill Myoview to evaluate for high risk ischemia -Schedule echo to evaluate LV function, wall motion, and right side pressure given lower extremity edema -Increase Ziac to 10/6.25 mg daily in an effort to allow for beta blocker component to treat any possible angina  -Add SL NTG 0.4 mg prn, discussed with patient not to take with any phosphodiesterase inhibitors (he does not take any of these), and discussed why he should not combine these two medications -Continue aspirin 81 mg daily   2. Palpitations: -Recent 48 hour Holter monitor as above, with possible atrial tachycardia -Patient still with palpitations -Found to be hypokalemic at his [facility] ED visit  -Increase Ziac as above to 10/6.25 mg daily in an effort to decrease his palpitations   -Schedule 30 day event monitor to assess for increased atrial arrhythmia burden  -Replete hypokalemia to goal of 4.0 -Recheck bmet first of the following week (9/12) -Recent TSH 09/2014 normal at 0.815 per [facility]   3. Family history of premature CAD:  -Per number 1  4. Morbid obesity s/p LAP-BAND:  -Continued healthy diet and exercise advised   5. HLD/hypertriglyceridemia:  -Fish oil  -TC of 155, LDL of 67, TG 316 on 10/06/2014 at [employer] [employer] per [facility]   6. Prior tobacco abuse: -Continued cessation   Dispo: -Follow up 6 weeks  Current medicines are reviewed at length with the patient today.  The patient did not have any concerns regarding medicines.   Eula Listen, PA-C [facility] [address] [place], [place] [address] [phone] [facility] 05/04/2015, 1:41 PM

## 2015-05-04 NOTE — Patient Instructions (Addendum)
Medication Instructions:  Your physician has recommended you make the following change in your medication:  START taking potassium 20mg  twice per day STOP taking ziac 5/6.25mg  START taking ziac 10/6.25mg  once per day START taking sublingual nitro as needed for chest pain. If chest pain unrelieved, go to Emergency Department   Labwork: Your physician recommends that you return for lab work Monday, Sept 12. You may take lab order given to you to the [facility] lab: BMET   Testing/Procedures: Your physician has recommended that you wear a 30 day event monitor. Event monitors are medical devices that record the heart's electrical activity. Doctors most often [place] these monitors to diagnose arrhythmias. Arrhythmias are problems with the speed or rhythm of the heartbeat. The monitor is a small, portable device. You can wear one while you do your normal daily activities. This is usually used to diagnose what is causing palpitations/syncope (passing out).  Your physician has requested that you have an echocardiogram. Echocardiography is a painless test that uses sound waves to create images of your heart. It provides your doctor with information about the size and shape of your heart and how well your heart's chambers and valves are working. This procedure takes approximately one hour. There are no restrictions for this procedure.  Your physician has requested that you have a lexiscan myoview. For further information please visit [email or web address] Please follow instruction sheet, as given.  [facility] MYOVIEW  Your caregiver has ordered a Stress Test with nuclear imaging. The purpose of this test is to evaluate the blood supply to your heart muscle. This procedure is referred to as a "Non-Invasive Stress Test." This is because other than having an IV started in your vein, nothing is inserted or "invades" your body. Cardiac stress tests are done to find areas of poor blood flow to the heart by determining the  extent of coronary artery disease (CAD). Some patients exercise on a treadmill, which naturally increases the blood flow to your heart, while others who are  unable to walk on a treadmill due to physical limitations have a pharmacologic/chemical stress agent called Lexiscan . This medicine will mimic walking on a treadmill by temporarily increasing your coronary blood flow.   Please note: these test may take anywhere between 2-4 hours to complete  PLEASE REPORT TO [facility] MEDICAL MALL ENTRANCE  THE VOLUNTEERS AT THE FIRST DESK WILL DIRECT YOU WHERE TO GO  Date of Procedure: Monday, May 07, 2015 at 8:00am Arrival Time for Procedure: 7:30am  Instructions regarding medication:    _xx___:  Hold ziac night before procedure and morning of procedure   PLEASE NOTIFY THE OFFICE AT LEAST 24 HOURS IN ADVANCE IF YOU ARE UNABLE TO KEEP YOUR APPOINTMENT.  [phone] AND  PLEASE NOTIFY NUCLEAR MEDICINE AT [facility] AT LEAST 24 HOURS IN ADVANCE IF YOU ARE UNABLE TO KEEP YOUR APPOINTMENT. [phone]  How to prepare for your Myoview test:   Do not eat or drink after midnight  No caffeine for 24 hours prior to test  No smoking 24 hours prior to test.  Your medication may be taken with water.  If your doctor stopped a medication because of this test, do not take that medication.  Ladies, please do not wear dresses.  Skirts or pants are appropriate. Please wear a short sleeve shirt.  No perfume, cologne or lotion.  Wear comfortable walking shoes. No heels!            Follow-Up: Your physician recommends that you schedule a follow-up appointment  in: six weeks with Eula Listen, PA-C   Any Other Special Instructions Will Be Listed Below (If Applicable).  Echocardiogram An echocardiogram, or echocardiography, uses sound waves (ultrasound) to produce an image of your heart. The echocardiogram is simple, painless, obtained within a short period of time, and offers valuable information to your  health care provider. The images from an echocardiogram can provide information such as:  Evidence of coronary artery disease (CAD).  Heart size.  Heart muscle function.  Heart valve function.  Aneurysm detection.  Evidence of a past heart attack.  Fluid buildup around the heart.  Heart muscle thickening.  Assess heart valve function. LET [facility] CARE PROVIDER KNOW ABOUT:  Any allergies you have.  All medicines you are taking, including vitamins, herbs, eye drops, creams, and over-the-counter medicines.  Previous problems you or members of your family have had with the use of anesthetics.  Any blood disorders you have.  Previous surgeries you have had.  Medical conditions you have.  Possibility of pregnancy, if this applies. BEFORE THE PROCEDURE  No special preparation is needed. Eat and drink normally.  PROCEDURE   In order to produce an image of your heart, gel will be applied to your chest and a wand-like tool (transducer) will be moved over your chest. The gel will help transmit the sound waves from the transducer. The sound waves will harmlessly bounce off your heart to allow the heart images to be captured in real-time motion. These images will then be recorded.  You may need an IV to receive a medicine that improves the quality of the pictures. AFTER THE PROCEDURE You may return to your normal schedule including diet, activities, and medicines, unless your health care provider tells you otherwise. Document Released: 08/08/2000 Document Revised: 12/26/2013 Document Reviewed: 04/18/2013 [facility] Patient Information 2015 [place], [place]. This information is not intended to replace advice given to you by your health care provider. Make sure you discuss any questions you have with your health care provider. Cardiac Event Monitoring A cardiac event monitor is a small recording device used to help detect abnormal heart rhythms (arrhythmias). The monitor is used to  record heart rhythm when noticeable symptoms such as the following occur:  Fast heartbeats (palpitations), such as heart racing or fluttering.  Dizziness.  Fainting or light-headedness.  Unexplained weakness. The monitor is wired to two electrodes placed on your chest. Electrodes are flat, sticky disks that attach to your skin. The monitor can be worn for up to 30 days. You will wear the monitor at all times, except when bathing.  HOW TO USE YOUR CARDIAC EVENT MONITOR A technician will prepare your chest for the electrode placement. The technician will show you how to place the electrodes, how to work the monitor, and how to replace the batteries. Take time to practice using the monitor before you leave the office. Make sure you understand how to send the information from the monitor to your health care provider. This requires a telephone with a landline, not a cell phone. You need to:  Wear your monitor at all times, except when you are in water:  Do not get the monitor wet.  Take the monitor off when bathing. Do not swim or use a hot tub with it on.  Keep your skin clean. Do not put body lotion or moisturizer on your chest.  Change the electrodes daily or any time they stop sticking to your skin. You might need to use tape to keep them on.  It is possible that your skin under the electrodes could become irritated. To keep this from happening, try to put the electrodes in slightly different places on your chest. However, they must remain in the area under your left breast and in the upper right section of your chest.  Make sure the monitor is safely clipped to your clothing or in a location close to your body that your health care provider recommends.  Press the button to record when you feel symptoms of heart trouble, such as dizziness, weakness, light-headedness, palpitations, thumping, shortness of breath, unexplained weakness, or a fluttering or racing heart. The monitor is always on  and records what happened slightly before you pressed the button, so do not worry about being too late to get good information.  Keep a diary of your activities, such as walking, doing chores, and taking medicine. It is especially important to note what you were doing when you pushed the button to record your symptoms. This will help your health care provider determine what might be contributing to your symptoms. The information stored in your monitor will be reviewed by your health care provider alongside your diary entries.  Send the recorded information as recommended by your health care provider. It is important to understand that it will take some time for your health care provider to process the results.  Change the batteries as recommended by your health care provider. SEEK IMMEDIATE MEDICAL CARE IF:   You have chest pain.  You have extreme difficulty breathing or shortness of breath.  You develop a very fast heartbeat that persists.  You develop dizziness that does not go away.  You faint or constantly feel you are about to faint. Document Released: 05/20/2008 Document Revised: 12/26/2013 Document Reviewed: 02/07/2013 [facility] Patient Information 2015 [place], [place]. This information is not intended to replace advice given to you by your health care provider. Make sure you discuss any questions you have with your health care provider. Nuclear Medicine Exam A nuclear medicine exam is a safe and painless imaging test. It helps to detect and diagnose disease in the body as well as provide information about organ function and structure.  Nuclear scans are most often done of the:  Lungs.  Heart.  Thyroid gland.  Bones.  Abdomen. HOW A NUCLEAR MEDICINE EXAM WORKS A nuclear medicine exam works by using a radioactive tracer. The material is given either by an IV (intravenous) injection or it may be swallowed. After the tracer is in the body, it is absorbed by your body's organs. A  large scanning machine that uses a special camera detects the radioactivity in your body. A computerized image is then formed regarding the area of concern. The small amounts of radioactive material used in a nuclear medicine exam are found to be medically safe. However, because radioactive material is used, this test is not done if you are pregnant or nursing.  BEFORE THE PROCEDURE  If available, bring previous imaging studies such as x-rays, etc. with you to the exam.  Arrive early for your exam. PROCEDURE  An IV may be started before the exam begins.  Depending on the type of examination, will lie on a table or sit in a chair during the exam.  The nuclear medicine exam will take about 30 to 60 minutes to complete. AFTER THE PROCEDURE  After your scan is completed, the image(s) will be evaluated by a specialist. It is important that you follow up with your caregiver to find out your  test results.  You may return to your regular activity as instructed by your caregiver. SEEK IMMEDIATE MEDICAL CARE IF: You have shortness of breath or difficulty breathing. MAKE SURE YOU:   Understand these instructions.  Will watch your condition.  Will get help right away if you are not doing well or get worse. Document Released: 09/18/2004 Document Revised: 11/03/2011 Document Reviewed: 11/02/2008 [facility] Patient Information 2015 [place], [place]. This information is not intended to replace advice given to you by your health care provider. Make sure you discuss any questions you have with your health care provider.

## 2015-05-04 NOTE — Telephone Encounter (Signed)
S/w pt to inform him that per Nuc Med, pt needs to arrive at 6:45am on day 2 of the 2-day treadmill myoview. Pt agreeable with no further questions.

## 2015-05-06 ENCOUNTER — Encounter (HOSPITAL_COMMUNITY): Payer: Self-pay | Admitting: Emergency Medicine

## 2015-05-06 ENCOUNTER — Observation Stay (HOSPITAL_COMMUNITY)
Admission: EM | Admit: 2015-05-06 | Discharge: 2015-05-08 | Disposition: A | Discharge status: Home / Self Care | Payer: 59 | Attending: Internal Medicine | Admitting: Internal Medicine

## 2015-05-06 ENCOUNTER — Emergency Department (HOSPITAL_COMMUNITY): Payer: 59

## 2015-05-06 DIAGNOSIS — Z87891 Personal history of nicotine dependence: Secondary | ICD-10-CM | POA: Insufficient documentation

## 2015-05-06 DIAGNOSIS — I10 Essential (primary) hypertension: Secondary | ICD-10-CM | POA: Insufficient documentation

## 2015-05-06 DIAGNOSIS — R739 Hyperglycemia, unspecified: Secondary | ICD-10-CM | POA: Diagnosis present

## 2015-05-06 DIAGNOSIS — E1165 Type 2 diabetes mellitus with hyperglycemia: Secondary | ICD-10-CM | POA: Diagnosis present

## 2015-05-06 DIAGNOSIS — R079 Chest pain, unspecified: Principal | ICD-10-CM | POA: Insufficient documentation

## 2015-05-06 DIAGNOSIS — K219 Gastro-esophageal reflux disease without esophagitis: Secondary | ICD-10-CM | POA: Insufficient documentation

## 2015-05-06 DIAGNOSIS — E119 Type 2 diabetes mellitus without complications: Secondary | ICD-10-CM | POA: Diagnosis not present

## 2015-05-06 DIAGNOSIS — Z9884 Bariatric surgery status: Secondary | ICD-10-CM | POA: Diagnosis not present

## 2015-05-06 DIAGNOSIS — Z7951 Long term (current) use of inhaled steroids: Secondary | ICD-10-CM | POA: Insufficient documentation

## 2015-05-06 DIAGNOSIS — IMO0002 Reserved for concepts with insufficient information to code with codable children: Secondary | ICD-10-CM | POA: Diagnosis present

## 2015-05-06 DIAGNOSIS — E785 Hyperlipidemia, unspecified: Secondary | ICD-10-CM | POA: Diagnosis not present

## 2015-05-06 DIAGNOSIS — Z6841 Body Mass Index (BMI) 40.0 and over, adult: Secondary | ICD-10-CM | POA: Diagnosis not present

## 2015-05-06 DIAGNOSIS — G4733 Obstructive sleep apnea (adult) (pediatric): Secondary | ICD-10-CM | POA: Diagnosis not present

## 2015-05-06 DIAGNOSIS — Z7982 Long term (current) use of aspirin: Secondary | ICD-10-CM | POA: Diagnosis not present

## 2015-05-06 DIAGNOSIS — Z8249 Family history of ischemic heart disease and other diseases of the circulatory system: Secondary | ICD-10-CM | POA: Diagnosis not present

## 2015-05-06 DIAGNOSIS — Z79899 Other long term (current) drug therapy: Secondary | ICD-10-CM | POA: Insufficient documentation

## 2015-05-06 LAB — COMPREHENSIVE METABOLIC PANEL
ALT: 105 U/L — AB (ref 17–63)
AST: 62 U/L — ABNORMAL HIGH (ref 15–41)
Albumin: 3.7 g/dL (ref 3.5–5.0)
Alkaline Phosphatase: 76 U/L (ref 38–126)
Anion gap: 8 (ref 5–15)
BILIRUBIN TOTAL: 0.6 mg/dL (ref 0.3–1.2)
BUN: 12 mg/dL (ref 6–20)
CHLORIDE: 100 mmol/L — AB (ref 101–111)
CO2: 29 mmol/L (ref 22–32)
CREATININE: 1.05 mg/dL (ref 0.61–1.24)
Calcium: 8.8 mg/dL — ABNORMAL LOW (ref 8.9–10.3)
Glucose, Bld: 244 mg/dL — ABNORMAL HIGH (ref 65–99)
Potassium: 3.9 mmol/L (ref 3.5–5.1)
Sodium: 137 mmol/L (ref 135–145)
TOTAL PROTEIN: 7.1 g/dL (ref 6.5–8.1)

## 2015-05-06 LAB — TROPONIN I: Troponin I: <0.03 ng/mL (ref <0.031)

## 2015-05-06 LAB — CBC WITH DIFFERENTIAL/PLATELET
Basophils Absolute: 0 10*3/uL (ref 0.0–0.1)
Basophils Relative: 0 % (ref 0–1)
EOS ABS: 0.1 10*3/uL (ref 0.0–0.7)
EOS PCT: 2 % (ref 0–5)
HCT: 46.2 % (ref 39.0–52.0)
Hemoglobin: 15.4 g/dL (ref 13.0–17.0)
LYMPHS ABS: 2.5 10*3/uL (ref 0.7–4.0)
Lymphocytes Relative: 28 % (ref 12–46)
MCH: 30 pg (ref 26.0–34.0)
MCHC: 33.3 g/dL (ref 30.0–36.0)
MCV: 89.9 fL (ref 78.0–100.0)
MONOS PCT: 8 % (ref 3–12)
Monocytes Absolute: 0.7 10*3/uL (ref 0.1–1.0)
Neutro Abs: 5.4 10*3/uL (ref 1.7–7.7)
Neutrophils Relative %: 62 % (ref 43–77)
PLATELETS: 198 10*3/uL (ref 150–400)
RBC: 5.14 MIL/uL (ref 4.22–5.81)
RDW: 13.5 % (ref 11.5–15.5)
WBC: 8.8 10*3/uL (ref 4.0–10.5)

## 2015-05-06 LAB — GLUCOSE, CAPILLARY: Glucose-Capillary: 208 mg/dL — ABNORMAL HIGH (ref 65–99)

## 2015-05-06 LAB — CBG MONITORING, ED: Glucose-Capillary: 250 mg/dL — ABNORMAL HIGH (ref 65–99)

## 2015-05-06 MED ORDER — NITROGLYCERIN 0.4 MG SL SUBL: 0.4000 mg | SUBLINGUAL_TABLET | SUBLINGUAL | Status: DC | PRN

## 2015-05-06 MED ORDER — INSULIN ASPART 100 UNIT/ML ~~LOC~~ SOLN
0.0000 [IU] | SUBCUTANEOUS | Status: DC
  Administered 2015-05-06: 3 [IU] via SUBCUTANEOUS
  Administered 2015-05-07 – 2015-05-08 (×7): 1 [IU] via SUBCUTANEOUS

## 2015-05-06 MED ORDER — MORPHINE SULFATE (PF) 4 MG/ML IV SOLN
4.0000 mg | Freq: Once | INTRAVENOUS | Status: AC
  Administered 2015-05-06: 4 mg via INTRAVENOUS

## 2015-05-06 MED ORDER — ENOXAPARIN SODIUM 40 MG/0.4ML ~~LOC~~ SOLN
40.0000 mg | SUBCUTANEOUS | Status: DC
  Administered 2015-05-06: 40 mg via SUBCUTANEOUS
  Filled 2015-05-06: qty 0.4

## 2015-05-06 MED ORDER — ASPIRIN EC 81 MG PO TBEC
81.0000 mg | DELAYED_RELEASE_TABLET | Freq: Every day | ORAL | Status: DC
  Administered 2015-05-06 – 2015-05-07 (×2): 81 mg via ORAL
  Filled 2015-05-06 (×2): qty 1

## 2015-05-06 MED ORDER — MORPHINE SULFATE (PF) 2 MG/ML IV SOLN
2.0000 mg | INTRAVENOUS | Status: DC | PRN
Start: 2015-05-06 — End: 2015-05-08
  Administered 2015-05-07: 2 mg via INTRAVENOUS
  Filled 2015-05-06: qty 1

## 2015-05-06 MED ORDER — ONDANSETRON HCL 4 MG/2ML IJ SOLN: 4.0000 mg | Freq: Four times a day (QID) | INTRAMUSCULAR | Status: DC | PRN

## 2015-05-06 MED ORDER — POTASSIUM CHLORIDE ER 10 MEQ PO TBCR
20.0000 meq | EXTENDED_RELEASE_TABLET | Freq: Two times a day (BID) | ORAL | Status: DC
  Administered 2015-05-06 – 2015-05-08 (×4): 20 meq via ORAL
  Filled 2015-05-06 (×10): qty 2

## 2015-05-06 MED ORDER — BISOPROLOL-HYDROCHLOROTHIAZIDE 10-6.25 MG PO TABS
1.0000 | ORAL_TABLET | Freq: Every day | ORAL | Status: DC
  Administered 2015-05-07: 1 via ORAL
  Filled 2015-05-06 (×4): qty 1

## 2015-05-06 MED ORDER — MORPHINE SULFATE (PF) 4 MG/ML IV SOLN
INTRAVENOUS | Status: AC
  Administered 2015-05-06: 4 mg via INTRAVENOUS
  Filled 2015-05-06: qty 1

## 2015-05-06 MED ORDER — ACETAMINOPHEN 325 MG PO TABS: 650.0000 mg | ORAL_TABLET | ORAL | Status: DC | PRN

## 2015-05-06 MED ORDER — ASPIRIN 81 MG PO TABS: 81.0000 mg | ORAL_TABLET | Freq: Every day | ORAL | Status: DC

## 2015-05-06 NOTE — H&P (Signed)
[facility] History and Physical  Hector Curry GEX:[id number] DOB: 09-28-1960 DOA: 05/06/2015  Referring physician: EDP PCP: Marguarite Arbour, MD   Chief Complaint: Chest pain   HPI: Hector Curry is a 54 y.o. male with h/o OSA on CPAP, HLD, family history of premature CAD with dad passing away from MI in late 58s.  Patient presents to ED with chest pain.  Pain located in substernal area, occurs intermittently.  No radiation.  Aching in quality.  Long story short, patient has already seen cardiology for this just a couple of days ago on the 9th.  Eula Listen PA-C's note is in Epic.  They had planned on stress test to be done as outpatient tomorrow already.  Review of Systems: Systems reviewed.  As above, otherwise negative  Past Medical History  Diagnosis Date  . Hyperlipidemia   . Hypertension   . OSA (obstructive sleep apnea)     a. on CPAP  . Family history of premature CAD     a. father passed from MI at 77  . Morbid obesity     a. s/p LAP-BAND  . History of tobacco abuse   . Palpitations     a. 48 hr Holter 04/2015: NSR w/ rare PVC, short runs of narrow complex tachycardiac, possible atrial tach, longest run 7 beats, PACs noted (2% of all beats 3600 total) they did not seem to correlate w/ significant arrythmia   . Kidney stones   . Allergic rhinitis   . Diverticulosis 10/03/2014  . GERD (gastroesophageal reflux disease)   . Hemorrhoids     a. internal hemorrhoids s/p surgery 1999  . Hyperplastic colon polyp 10/03/2014    a. x 2   . Inflammatory arthritis     a. CCP antibodies & x-rays negative. Rheumatoid factor 14, felt to be crystaline over RA or psoriatic   Past Surgical History  Procedure Laterality Date  . Bariatric surgery      lap band   . Cholecystectomy    . Tonsillectomy    . Hemorrhoid surgery  1999  . Carpal tunnel release Left   . Colonoscopy  06/27/2005  . Colonoscopy  10/03/2014  . Esophagogastroduodenoscopy  06/27/2005   Social History:   reports that he quit smoking about 25 years ago. His smoking use included Cigarettes. He has a 15 pack-year smoking history. He has never used smokeless tobacco. He reports that he does not drink alcohol or use illicit drugs.  No Known Allergies  Family History  Problem Relation Age of Onset  . Heart attack Father 32  . Hypertension Sister   . CAD Father   . Hypertension Mother   . Diabetes Mother   . Diabetes Other   . Hypertension Other   . Heart disease Other      Prior to Admission medications   Medication Sig Start Date End Date Taking? Authorizing Provider  aspirin 81 MG tablet Take 81 mg by mouth daily.   Yes Historical Provider, MD  bisoprolol-hydrochlorothiazide (ZIAC) 10-6.25 MG per tablet Take 1 tablet by mouth daily. 05/04/15  Yes Ryan M Dunn, PA-C  fluticasone (FLONASE) 50 MCG/ACT nasal spray Place 1 spray into both nostrils daily as needed for allergies or rhinitis.   Yes Historical Provider, MD  hydroxypropyl methylcellulose / hypromellose (ISOPTO TEARS / GONIOVISC) 2.5 % ophthalmic solution Place 1 drop into both eyes as needed for dry eyes.   Yes Historical Provider, MD  nitroGLYCERIN (NITROSTAT) 0.4 MG SL tablet Place 1 tablet (0.4  mg total) under the tongue every 5 (five) minutes as needed for chest pain. May take up to 3 tablets 05/04/15  Yes Ryan M Dunn, PA-C  potassium chloride 20 MEQ TBCR Take 20 mEq by mouth 2 (two) times daily. 05/04/15  Yes Ryan M Dunn, PA-C  cholecalciferol (VITAMIN D) 1000 UNITS tablet Take 1,000 Units by mouth daily.     Historical Provider, MD  glucosamine-chondroitin 500-400 MG tablet Take 1 tablet by mouth 2 (two) times daily.     Historical Provider, MD  Multiple Vitamin (MULTIVITAMIN WITH MINERALS) TABS tablet Take 1 tablet by mouth daily.    Historical Provider, MD  Omega-3 Fatty Acids (FISH OIL) 1000 MG CAPS Take 1,000 mg by mouth 2 (two) times daily.     Historical Provider, MD  vitamin E 400 UNIT capsule Take 400 Units by mouth daily.     Historical Provider, MD   Physical Exam: Filed Vitals:   05/06/15 2105  BP: 120/77  Pulse: 80  Temp:   Resp: 16    BP 120/77 mmHg  Pulse 80  Temp(Src) 98.3 F (36.8 C) (Oral)  Resp 16  Ht 6\' 2"  (1.88 m)  Wt 147.419 kg (325 lb)  BMI 41.71 kg/m2  SpO2 100%  General Appearance:    Alert, oriented, no distress, appears stated age  Head:    Normocephalic, atraumatic  Eyes:    PERRL, EOMI, sclera non-icteric        Nose:   Nares without drainage or epistaxis. Mucosa, turbinates normal  Throat:   Moist mucous membranes. Oropharynx without erythema or exudate.  Neck:   Supple. No carotid bruits.  No thyromegaly.  No lymphadenopathy.   Back:     No CVA tenderness, no spinal tenderness  Lungs:     Clear to auscultation bilaterally, without wheezes, rhonchi or rales  Chest wall:    No tenderness to palpitation  Heart:    Regular rate and rhythm without murmurs, gallops, rubs  Abdomen:     Soft, non-tender, nondistended, normal bowel sounds, no organomegaly  Genitalia:    deferred  Rectal:    deferred  Extremities:   No clubbing, cyanosis or edema.  Pulses:   2+ and symmetric all extremities  Skin:   Skin color, texture, turgor normal, no rashes or lesions  Lymph nodes:   Cervical, supraclavicular, and axillary nodes normal  Neurologic:   CNII-XII intact. Normal strength, sensation and reflexes      throughout    Labs on Admission:  Basic Metabolic Panel:  Recent Labs Lab 05/06/15 1750  NA 137  K 3.9  CL 100*  CO2 29  GLUCOSE 244*  BUN 12  CREATININE 1.05  CALCIUM 8.8*   Liver Function Tests:  Recent Labs Lab 05/06/15 1750  AST 62*  ALT 105*  ALKPHOS 76  BILITOT 0.6  PROT 7.1  ALBUMIN 3.7   No results for input(s): LIPASE, AMYLASE in the last 168 hours. No results for input(s): AMMONIA in the last 168 hours. CBC:  Recent Labs Lab 05/06/15 1750  WBC 8.8  NEUTROABS 5.4  HGB 15.4  HCT 46.2  MCV 89.9  PLT 198   Cardiac Enzymes:  Recent Labs Lab  05/06/15 1750  TROPONINI <0.03    BNP (last 3 results) No results for input(s): PROBNP in the last 8760 hours. CBG:  Recent Labs Lab 05/06/15 2104  GLUCAP 250*    Radiological Exams on Admission: Dg Chest Portable 1 View  05/06/2015   CLINICAL DATA:  Chest pain.  EXAM: PORTABLE CHEST - 1 VIEW  COMPARISON:  April 26, 2015.  FINDINGS: The heart size and mediastinal contours are within normal limits. Both lungs are clear. No pneumothorax or pleural effusion is noted. The visualized skeletal structures are unremarkable.  IMPRESSION: No acute cardiopulmonary abnormality seen.   Electronically Signed   By: Lupita Raider, M.D.   On: 05/06/2015 18:25    EKG: Independently reviewed.  Assessment/Plan Active Problems:   Chest pain   1. Chest pain - 1. Consult to cards placed in Epic re: stress test tomorrow.  See also Eula Listen PA-C note from the 9th. 2. NPO after midnight 3. Chest pain obs pathway 4. Tele monitor 5. Serial trops 2. Hyperglycemia - 1. No diagnosis of DM but "runs in the family" 2. A1C ordered 3. Sensitive scale SSI q4h ordered   Code Status: Full Code  Family Communication: Family at bedside Disposition Plan: Admit to obs   Time spent: 50 min  Tijuana Scheidegger M. [facility] Pager [phone]  If 7AM-7PM, please contact the day team taking care of the patient [email or web address] Password [email or web address] 05/06/2015, 9:07 PM

## 2015-05-06 NOTE — ED Provider Notes (Signed)
CSN: [id number]     Arrival date & time 05/06/15  1740 History   First MD Initiated Contact with Patient 05/06/15 1812     Chief Complaint  Patient presents with  . Chest Pain     (Consider location/radiation/quality/duration/timing/severity/associated sxs/prior Treatment) Patient is a 54 y.o. male presenting with chest pain. The history is provided by the patient (The patient states he's been having chest pain for a couple weeks. He is scheduled for stress test tomorrow. The last few days his chest pain is gotten more severe and coming more frequently.).  Chest Pain Pain location:  Substernal area Pain quality: aching   Pain radiates to:  Does not radiate Pain severity:  Moderate Onset quality:  Sudden Timing:  Intermittent Chronicity:  Recurrent Context: not breathing   Associated symptoms: no abdominal pain, no back pain, no cough, no fatigue and no headache     Past Medical History  Diagnosis Date  . Hyperlipidemia   . Hypertension   . OSA (obstructive sleep apnea)     a. on CPAP  . Family history of premature CAD     a. father passed from MI at 22  . Morbid obesity     a. s/p LAP-BAND  . History of tobacco abuse   . Palpitations     a. 48 hr Holter 04/2015: NSR w/ rare PVC, short runs of narrow complex tachycardiac, possible atrial tach, longest run 7 beats, PACs noted (2% of all beats 3600 total) they did not seem to correlate w/ significant arrythmia   . Kidney stones   . Allergic rhinitis   . Diverticulosis 10/03/2014  . GERD (gastroesophageal reflux disease)   . Hemorrhoids     a. internal hemorrhoids s/p surgery 1999  . Hyperplastic colon polyp 10/03/2014    a. x 2   . Inflammatory arthritis     a. CCP antibodies & x-rays negative. Rheumatoid factor 14, felt to be crystaline over RA or psoriatic   Past Surgical History  Procedure Laterality Date  . Bariatric surgery      lap band   . Cholecystectomy    . Tonsillectomy    . Hemorrhoid surgery  1999  . Carpal  tunnel release Left   . Colonoscopy  06/27/2005  . Colonoscopy  10/03/2014  . Esophagogastroduodenoscopy  06/27/2005   Family History  Problem Relation Age of Onset  . Heart attack Father 106  . Hypertension Sister   . CAD Father   . Hypertension Mother   . Diabetes Mother   . Diabetes Other   . Hypertension Other   . Heart disease Other    Social History  Substance Use Topics  . Smoking status: Former Smoker -- 1.00 packs/day for 15 years    Types: Cigarettes    Quit date: 08/05/1989  . Smokeless tobacco: Never Used  . Alcohol Use: No    Review of Systems  Constitutional: Negative for appetite change and fatigue.  HENT: Negative for congestion, ear discharge and sinus pressure.   Eyes: Negative for discharge.  Respiratory: Negative for cough.   Cardiovascular: Positive for chest pain.  Gastrointestinal: Negative for abdominal pain and diarrhea.  Genitourinary: Negative for frequency and hematuria.  Musculoskeletal: Negative for back pain.  Skin: Negative for rash.  Neurological: Negative for seizures and headaches.  Psychiatric/Behavioral: Negative for hallucinations.      Allergies  Review of patient's allergies indicates no known allergies.  Home Medications   Prior to Admission medications   Medication Sig Start  Date End Date Taking? Authorizing Provider  aspirin 81 MG tablet Take 81 mg by mouth daily.   Yes Historical Provider, MD  bisoprolol-hydrochlorothiazide (ZIAC) 10-6.25 MG per tablet Take 1 tablet by mouth daily. 05/04/15  Yes Ryan M Dunn, PA-C  fluticasone (FLONASE) 50 MCG/ACT nasal spray Place 1 spray into both nostrils daily as needed for allergies or rhinitis.   Yes Historical Provider, MD  hydroxypropyl methylcellulose / hypromellose (ISOPTO TEARS / GONIOVISC) 2.5 % ophthalmic solution Place 1 drop into both eyes as needed for dry eyes.   Yes Historical Provider, MD  nitroGLYCERIN (NITROSTAT) 0.4 MG SL tablet Place 1 tablet (0.4 mg total) under the tongue  every 5 (five) minutes as needed for chest pain. May take up to 3 tablets 05/04/15  Yes Ryan M Dunn, PA-C  potassium chloride 20 MEQ TBCR Take 20 mEq by mouth 2 (two) times daily. 05/04/15  Yes Ryan M Dunn, PA-C  cholecalciferol (VITAMIN D) 1000 UNITS tablet Take 1,000 Units by mouth daily.     Historical Provider, MD  glucosamine-chondroitin 500-400 MG tablet Take 1 tablet by mouth 2 (two) times daily.     Historical Provider, MD  Multiple Vitamin (MULTIVITAMIN WITH MINERALS) TABS tablet Take 1 tablet by mouth daily.    Historical Provider, MD  Omega-3 Fatty Acids (FISH OIL) 1000 MG CAPS Take 1,000 mg by mouth 2 (two) times daily.     Historical Provider, MD  vitamin E 400 UNIT capsule Take 400 Units by mouth daily.    Historical Provider, MD   BP 119/74 mmHg  Pulse 77  Temp(Src) 98.3 F (36.8 C) (Oral)  Resp 18  Ht 6\' 2"  (1.88 m)  Wt 325 lb (147.419 kg)  BMI 41.71 kg/m2  SpO2 99% Physical Exam  Constitutional: He is oriented to person, place, and time. He appears well-developed.  HENT:  Head: Normocephalic.  Eyes: Conjunctivae and EOM are normal. No scleral icterus.  Neck: Neck supple. No thyromegaly present.  Cardiovascular: Normal rate and regular rhythm.  Exam reveals no gallop and no friction rub.   No murmur heard. Pulmonary/Chest: No stridor. He has no wheezes. He has no rales. He exhibits no tenderness.  Abdominal: He exhibits no distension. There is no tenderness. There is no rebound.  Musculoskeletal: Normal range of motion. He exhibits no edema.  Lymphadenopathy:    He has no cervical adenopathy.  Neurological: He is oriented to person, place, and time. He exhibits normal muscle tone. Coordination normal.  Skin: No rash noted. No erythema.  Psychiatric: He has a normal mood and affect. His behavior is normal.    ED Course  Procedures (including critical care time) Labs Review Labs Reviewed  COMPREHENSIVE METABOLIC PANEL - Abnormal; Notable for the following:     Chloride 100 (*)    Glucose, Bld 244 (*)    Calcium 8.8 (*)    AST 62 (*)    ALT 105 (*)    All other components within normal limits  CBC WITH DIFFERENTIAL/PLATELET  TROPONIN I    Imaging Review Dg Chest Portable 1 View  05/06/2015   CLINICAL DATA:  Chest pain.  EXAM: PORTABLE CHEST - 1 VIEW  COMPARISON:  April 26, 2015.  FINDINGS: The heart size and mediastinal contours are within normal limits. Both lungs are clear. No pneumothorax or pleural effusion is noted. The visualized skeletal structures are unremarkable.  IMPRESSION: No acute cardiopulmonary abnormality seen.   Electronically Signed   By: Lupita Raider, M.D.  On: 05/06/2015 18:25   I have personally reviewed and evaluated these images and lab results as part of my medical decision-making.   EKG Interpretation   Date/Time:  Sunday May 06 2015 17:48:00 EDT Ventricular Rate:  92 PR Interval:  177 QRS Duration: 114 QT Interval:  359 QTC Calculation: 444 R Axis:   83 Text Interpretation:  Sinus rhythm Borderline intraventricular conduction  delay Low voltage, precordial leads Baseline wander in lead(s) I V3 No  significant change since last tracing Confirmed by Ethelda Chick  MD, SAM  [phone]) on 05/06/2015 6:03:43 PM      MDM   Final diagnoses:  Chest pain at rest    Admit for chest pain and stress test    Bethann Berkshire, MD 05/06/15 2047

## 2015-05-06 NOTE — Progress Notes (Signed)
Patient has history of obstructive sleep apnea and has his home cpap machine, paged the on-call MD for an order for patient to wear his unit. Will follow new orders given and continue to monitor the patient.

## 2015-05-06 NOTE — ED Notes (Signed)
Patient c/o non-radiating left side chest pain with dizziness and shortness of breath. Per patient started by having palpitations. Patient reports taking x2 SL nitro with no relief. Patent also reports taking x2  aspirin today. Per patient didn't have cardiac hx till week and half ago when he was seen her for palpitations. Patient had some hypokalemia in which he was put on potassium. Blood pressure medication was increased, wore heart monitor for 48 hours. Patient states that he has strees test tomorrow and is supposed to start wearing a cardiac monitor for 30 days.

## 2015-05-07 ENCOUNTER — Inpatient Hospital Stay: Admission: RE | Admit: 2015-05-07 | Payer: 59 | Source: Ambulatory Visit

## 2015-05-07 ENCOUNTER — Encounter: Admission: RE | Admit: 2015-05-07 | Payer: 59 | Source: Ambulatory Visit

## 2015-05-07 ENCOUNTER — Other Ambulatory Visit: Payer: 59

## 2015-05-07 ENCOUNTER — Telehealth: Payer: Self-pay | Admitting: Cardiovascular Disease

## 2015-05-07 DIAGNOSIS — R079 Chest pain, unspecified: Secondary | ICD-10-CM | POA: Diagnosis not present

## 2015-05-07 DIAGNOSIS — R739 Hyperglycemia, unspecified: Secondary | ICD-10-CM | POA: Diagnosis present

## 2015-05-07 DIAGNOSIS — R0789 Other chest pain: Secondary | ICD-10-CM | POA: Diagnosis not present

## 2015-05-07 DIAGNOSIS — E785 Hyperlipidemia, unspecified: Secondary | ICD-10-CM | POA: Diagnosis not present

## 2015-05-07 DIAGNOSIS — G4733 Obstructive sleep apnea (adult) (pediatric): Secondary | ICD-10-CM | POA: Diagnosis not present

## 2015-05-07 LAB — GLUCOSE, CAPILLARY
GLUCOSE-CAPILLARY: 134 mg/dL — AB (ref 65–99)
GLUCOSE-CAPILLARY: 146 mg/dL — AB (ref 65–99)
GLUCOSE-CAPILLARY: 136 mg/dL — AB (ref 65–99)
GLUCOSE-CAPILLARY: 120 mg/dL — AB (ref 65–99)
GLUCOSE-CAPILLARY: 121 mg/dL — AB (ref 65–99)
Glucose-Capillary: 140 mg/dL — ABNORMAL HIGH (ref 65–99)
Glucose-Capillary: 131 mg/dL — ABNORMAL HIGH (ref 65–99)

## 2015-05-07 LAB — TROPONIN I: Troponin I: <0.03 ng/mL (ref <0.031)

## 2015-05-07 MED ORDER — MAGNESIUM HYDROXIDE 400 MG/5ML PO SUSP
30.0000 mL | Freq: Every day | ORAL | Status: DC | PRN
  Administered 2015-05-07: 30 mL via ORAL
  Filled 2015-05-07: qty 30

## 2015-05-07 MED ORDER — SODIUM CHLORIDE 0.9 % IJ SOLN
3.0000 mL | Freq: Two times a day (BID) | INTRAMUSCULAR | Status: DC
  Administered 2015-05-07 – 2015-05-08 (×2): 3 mL via INTRAVENOUS

## 2015-05-07 MED ORDER — SODIUM CHLORIDE 0.9 % WEIGHT BASED INFUSION
1.0000 mL/kg/h | INTRAVENOUS | Status: DC
  Administered 2015-05-08: 1 mL/kg/h via INTRAVENOUS

## 2015-05-07 MED ORDER — SODIUM CHLORIDE 0.9 % IJ SOLN: 3.0000 mL | INTRAMUSCULAR | Status: DC | PRN

## 2015-05-07 MED ORDER — ASPIRIN 81 MG PO CHEW
81.0000 mg | CHEWABLE_TABLET | ORAL | Status: AC
  Administered 2015-05-08: 81 mg via ORAL
  Filled 2015-05-07: qty 1

## 2015-05-07 MED ORDER — SODIUM CHLORIDE 0.9 % WEIGHT BASED INFUSION
3.0000 mL/kg/h | INTRAVENOUS | Status: DC
  Administered 2015-05-08: 3 mL/kg/h via INTRAVENOUS

## 2015-05-07 MED ORDER — SODIUM CHLORIDE 0.9 % IV SOLN: 250.0000 mL | INTRAVENOUS | Status: DC | PRN

## 2015-05-07 NOTE — Consult Note (Signed)
CARDIOLOGY CONSULT NOTE       Patient ID: Hector Curry MRN: [id number] DOB/AGE: 30-Aug-1960 54 y.o.  Admit date: 05/06/2015 Referring Physician:  Kerry Hough Primary Physician: Marguarite Arbour, MD Primary Cardiologist:  Mariah Milling Reason for Consultation: Chest paon  Active Problems:   Chest pain   HPI:  54 y.o. obese male with family history of premature CAD dad with MI in 41s.  Previous chest pain w/u multiple times.  Last with myovue end of last your normal.  Previously seen by Gavin Potters in [place] with normal cath 5-7 yrs ago.  Seen by Dr Mariah Milling and Eula Listen a few weeks ago and outpatient stress test scheduled for today but he was admitted to [facility].  Has palpitations then left sided pain.  Non exertional lasted hours relieved with morphine.  Recurrent.  Has OSA with CPAP.  Pain has been for weeks Really never went away since last myovue.  Other CRF;s include HTN and elevated lipids.  Previousl lap band but still significantly overweight.  Currently pain free this am had Holter this month with rate PVC and short run of atrial tachycardia not correlated with symptoms  ROS All other systems reviewed and negative except as noted above  Past Medical History  Diagnosis Date  . Hyperlipidemia   . Hypertension   . Family history of premature CAD     a. father passed from MI at 4  . Morbid obesity     a. s/p LAP-BAND  . History of tobacco abuse   . Palpitations     a. 48 hr Holter 04/2015: NSR w/ rare PVC, short runs of narrow complex tachycardiac, possible atrial tach, longest run 7 beats, PACs noted (2% of all beats 3600 total) they did not seem to correlate w/ significant arrythmia   . Kidney stones   . Allergic rhinitis   . Diverticulosis 10/03/2014  . GERD (gastroesophageal reflux disease)   . Hemorrhoids     a. internal hemorrhoids s/p surgery 1999  . Hyperplastic colon polyp 10/03/2014    a. x 2   . Inflammatory arthritis     a. CCP antibodies & x-rays negative. Rheumatoid  factor 14, felt to be crystaline over RA or psoriatic  . OSA (obstructive sleep apnea)     a. on CPAP    Family History  Problem Relation Age of Onset  . Heart attack Father 87  . Hypertension Sister   . CAD Father   . Hypertension Mother   . Diabetes Mother   . Diabetes Other   . Hypertension Other   . Heart disease Other     Social History   Social History  . Marital Status: Married    Spouse Name: N/A  . Number of Children: N/A  . Years of Education: N/A   Occupational History  . Not on file.   Social History Main Topics  . Smoking status: Former Smoker -- 1.00 packs/day for 15 years    Types: Cigarettes    Quit date: 08/05/1989  . Smokeless tobacco: Never Used  . Alcohol Use: No  . Drug Use: No  . Sexual Activity: Not on file   Other Topics Concern  . Not on file   Social History Narrative    Past Surgical History  Procedure Laterality Date  . Bariatric surgery      lap band   . Cholecystectomy    . Tonsillectomy    . Hemorrhoid surgery  1999  . Carpal tunnel release Left   .  Colonoscopy  06/27/2005  . Colonoscopy  10/03/2014  . Esophagogastroduodenoscopy  06/27/2005     . aspirin EC  81 mg Oral Daily  . bisoprolol-hydrochlorothiazide  1 tablet Oral Daily  . enoxaparin (LOVENOX) injection  40 mg Subcutaneous Q24H  . insulin aspart  0-9 Units Subcutaneous 6 times per day  . potassium chloride  20 mEq Oral BID      Physical Exam: Blood pressure 143/76, pulse 71, temperature 98.3 F (36.8 C), temperature source Oral, resp. rate 18, height 6\' 2"  (1.88 m), weight 148.961 kg (328 lb 6.4 oz), SpO2 97 %.    Affect appropriate Obese white male  HEENT: normal Neck supple with no adenopathy JVP normal no bruits no thyromegaly Lungs clear with no wheezing and good diaphragmatic motion Heart:  S1/S2 no murmur, no rub, gallop or click PMI normal Abdomen: benighn, BS positve, no tenderness, no AAA no bruit.  No HSM or HJR Distal pulses intact with no  bruits No edema Neuro non-focal Skin warm and dry No muscular weakness   Labs:   Lab Results  Component Value Date   WBC 8.8 05/06/2015   HGB 15.4 05/06/2015   HCT 46.2 05/06/2015   MCV 89.9 05/06/2015   PLT 198 05/06/2015    Recent Labs Lab 05/06/15 1750  NA 137  K 3.9  CL 100*  CO2 29  BUN 12  CREATININE 1.05  CALCIUM 8.8*  PROT 7.1  BILITOT 0.6  ALKPHOS 76  ALT 105*  AST 62*  GLUCOSE 244*   Lab Results  Component Value Date   TROPONINI <0.03 05/06/2015   No results found for: CHOL No results found for: HDL No results found for: LDLCALC No results found for: TRIG No results found for: CHOLHDL No results found for: LDLDIRECT    Radiology: Dg Chest 2 View  04/26/2015   CLINICAL DATA:  Chest pain and dyspnea onset this morning  EXAM: CHEST  2 VIEW  COMPARISON:  09/12/2011  FINDINGS: The heart size and mediastinal contours are within normal limits. Both lungs are clear. The visualized skeletal structures are unremarkable.  IMPRESSION: No active cardiopulmonary disease.   Electronically Signed   By: Ellery Plunk M.D.   On: 04/26/2015 06:37   Mr Maxine Glenn Head Wo Contrast  04/26/2015   CLINICAL DATA:  Intermittent left-sided numbness. History of hypertension and hyperlipidemia.  EXAM: MRI HEAD WITHOUT CONTRAST  MRA HEAD WITHOUT CONTRAST  TECHNIQUE: Multiplanar, multiecho pulse sequences of the brain and surrounding structures were obtained without intravenous contrast. Angiographic images of the head were obtained using MRA technique without contrast.  COMPARISON:  None.  FINDINGS: MRI HEAD FINDINGS  There is a punctate focus of susceptibility artifact in the inferior right cerebellum, nonspecific but may reflect a focus of chronic microhemorrhage or calcification. There is no other evidence intracranial hemorrhage. There is no evidence of acute infarct, mass, midline shift, or extra-axial fluid collection. Ventricles and sulci are normal for age. Punctate foci of T2  hyperintensity in the subcortical cerebral white matter are nonspecific but may reflect minimal chronic small vessel ischemic disease.  Orbits are unremarkable. Paranasal sinuses and mastoid air cells are clear. Major intracranial vascular flow voids are preserved.  MRA HEAD FINDINGS  Visualized distal vertebral arteries are patent with the left being dominant. PICA origins are patent. Right AICA and bilateral SCA origins are patent. Basilar artery is patent without stenosis. PCAs are patent with branch vessel irregularity and attenuation but no proximal stenosis. Posterior communicating arteries are not  clearly identified.  Internal carotid arteries are patent with apparent moderate stenosis of the proximal left supraclinoid ICA, although the degree of stenosis may be overestimated due to the course of the vessel at this location. No intracranial ICA stenosis on the right. Anterior communicating artery is patent. ACAs and MCAs are patent with mild to moderate branch vessel irregularity suggestive of atherosclerosis but no high-grade proximal stenosis. No intracranial aneurysm is identified.  Neck MRA could not be obtained due to patient body habitus.  IMPRESSION: 1. No acute intracranial abnormality. At most minimal chronic small vessel ischemic disease in the cerebral white matter. 2. No major intracranial arterial occlusion or flow limiting proximal stenosis. Branch vessel atherosclerosis and possibly moderate left supraclinoid ICA stenosis.   Electronically Signed   By: Sebastian Ache M.D.   On: 04/26/2015 08:47   Mr Brain Wo Contrast  04/26/2015   CLINICAL DATA:  Intermittent left-sided numbness. History of hypertension and hyperlipidemia.  EXAM: MRI HEAD WITHOUT CONTRAST  MRA HEAD WITHOUT CONTRAST  TECHNIQUE: Multiplanar, multiecho pulse sequences of the brain and surrounding structures were obtained without intravenous contrast. Angiographic images of the head were obtained using MRA technique without  contrast.  COMPARISON:  None.  FINDINGS: MRI HEAD FINDINGS  There is a punctate focus of susceptibility artifact in the inferior right cerebellum, nonspecific but may reflect a focus of chronic microhemorrhage or calcification. There is no other evidence intracranial hemorrhage. There is no evidence of acute infarct, mass, midline shift, or extra-axial fluid collection. Ventricles and sulci are normal for age. Punctate foci of T2 hyperintensity in the subcortical cerebral white matter are nonspecific but may reflect minimal chronic small vessel ischemic disease.  Orbits are unremarkable. Paranasal sinuses and mastoid air cells are clear. Major intracranial vascular flow voids are preserved.  MRA HEAD FINDINGS  Visualized distal vertebral arteries are patent with the left being dominant. PICA origins are patent. Right AICA and bilateral SCA origins are patent. Basilar artery is patent without stenosis. PCAs are patent with branch vessel irregularity and attenuation but no proximal stenosis. Posterior communicating arteries are not clearly identified.  Internal carotid arteries are patent with apparent moderate stenosis of the proximal left supraclinoid ICA, although the degree of stenosis may be overestimated due to the course of the vessel at this location. No intracranial ICA stenosis on the right. Anterior communicating artery is patent. ACAs and MCAs are patent with mild to moderate branch vessel irregularity suggestive of atherosclerosis but no high-grade proximal stenosis. No intracranial aneurysm is identified.  Neck MRA could not be obtained due to patient body habitus.  IMPRESSION: 1. No acute intracranial abnormality. At most minimal chronic small vessel ischemic disease in the cerebral white matter. 2. No major intracranial arterial occlusion or flow limiting proximal stenosis. Branch vessel atherosclerosis and possibly moderate left supraclinoid ICA stenosis.   Electronically Signed   By: Sebastian Ache  M.D.   On: 04/26/2015 08:47   Dg Chest Portable 1 View  05/06/2015   CLINICAL DATA:  Chest pain.  EXAM: PORTABLE CHEST - 1 VIEW  COMPARISON:  April 26, 2015.  FINDINGS: The heart size and mediastinal contours are within normal limits. Both lungs are clear. No pneumothorax or pleural effusion is noted. The visualized skeletal structures are unremarkable.  IMPRESSION: No acute cardiopulmonary abnormality seen.   Electronically Signed   By: Lupita Raider, M.D.   On: 05/06/2015 18:25    EKG:  NSR normal Telemetry:  NSR   ASSESSMENT AND PLAN:  Chest Pain: recurrent despite normal myovue end of last year.  Relief with morphine  R/O no acute ECG changes Given family history and CRF;s and recurrence despite normal myouve will transfer to [facility] for cath.  Will need to be Tuesday 3rd case with Dr Abe People.  Orders written cath lab called  OSA: continue CPAP  Obesity:  Post lap band with recurrence discussed diet and exercise if cath shows no obstructive disease   Signed: Charlton Haws 05/07/2015, 8:08 AM

## 2015-05-07 NOTE — Progress Notes (Signed)
Pt arrived to floor in NAD, pain free at this time. Pt oriented to room and floor, updated on plan of care regarding cath in AM. VSS, will continue to monitor. Huel Coventry, RN

## 2015-05-07 NOTE — Progress Notes (Signed)
[facility] PROGRESS NOTE  Hector Curry:[id number] DOB: 16-May-1961 DOA: 05/06/2015 PCP: Marguarite Arbour, MD  Assessment/Plan: 1. Chest pain. Patient has ruled out ACS with negative cardiac markers. Unfortunately he continues to have chest pain despite normal stress test at the end of last year. Cardiology has seen patient and is arranging transfer to [facility] for Heart Catheterization. Pt has been accepted by Dr. Tenny Craw. 2. Hyperglycemia. Continue SSI. Pt likely has underlying diagnosis of DM. A1c is in process.  3. Hypertension. Blood pressure is stable. Continue antihypertensives.  4. HLD. Continue statin.  5. OSA. Continue CPAP.  Code Status: Full DVT Prophylaxis Lovenox Family discussion: Dicussed plan in detail with patient and wife. No further concerns at this time. Disposition Plan: Transfer to [facility] for Heart Cath .    Consultants:  Cardiology  Procedures:    Antibiotics:    HPI/Subjective: No chest pain, SOB. Reports palpitations.   Objective: Filed Vitals:   05/07/15 0427  BP: 143/76  Pulse: 71  Temp: 98.3 F (36.8 C)  Resp: 18   No intake or output data in the 24 hours ending 05/07/15 1037 Filed Weights   05/06/15 1750 05/06/15 2231  Weight: 147.419 kg (325 lb) 148.961 kg (328 lb 6.4 oz)    Exam: General:  Appears comfortable, calm. Cardiovascular: Regular rate and rhythm, no murmur, rub or gallop. No lower extremity edema. Telemetry: Sinus rhythm, no arrhythmias  Respiratory: Clear to auscultation bilaterally, no wheezes, rales or rhonchi. Normal respiratory effort. Abdomen: soft, ntnd Musculoskeletal: grossly normal tone bilateral upper and lower extremities Psychiatric: grossly normal mood and affect, speech fluent and appropriate   Data Reviewed: Basic Metabolic Panel:  Recent Labs Lab 05/06/15 1750  NA 137  K 3.9  CL 100*  CO2 29  GLUCOSE 244*  BUN 12  CREATININE 1.05  CALCIUM 8.8*   Liver Function  Tests:  Recent Labs Lab 05/06/15 1750  AST 62*  ALT 105*  ALKPHOS 76  BILITOT 0.6  PROT 7.1  ALBUMIN 3.7   CBC:  Recent Labs Lab 05/06/15 1750  WBC 8.8  NEUTROABS 5.4  HGB 15.4  HCT 46.2  MCV 89.9  PLT 198   Cardiac Enzymes:  Recent Labs Lab 05/06/15 1750 05/06/15 2128 05/06/15 2358  TROPONINI <0.03 <0.03 <0.03   BNP (last 3 results)  ProBNP (last 3 results)  CBG:  Recent Labs Lab 05/06/15 2104 05/06/15 2236 05/07/15 0101 05/07/15 0425 05/07/15 0737  GLUCAP 250* 208* 140* 134* 146*     Studies: Dg Chest Portable 1 View  05/06/2015   CLINICAL DATA:  Chest pain.  EXAM: PORTABLE CHEST - 1 VIEW  COMPARISON:  April 26, 2015.  FINDINGS: The heart size and mediastinal contours are within normal limits. Both lungs are clear. No pneumothorax or pleural effusion is noted. The visualized skeletal structures are unremarkable.  IMPRESSION: No acute cardiopulmonary abnormality seen.   Electronically Signed   By: Lupita Raider, M.D.   On: 05/06/2015 18:25    Scheduled Meds: . aspirin EC  81 mg Oral Daily  . bisoprolol-hydrochlorothiazide  1 tablet Oral Daily  . enoxaparin (LOVENOX) injection  40 mg Subcutaneous Q24H  . insulin aspart  0-9 Units Subcutaneous 6 times per day  . potassium chloride  20 mEq Oral BID   Continuous Infusions:   Active Problems:   Chest pain    Time spent: 35 minutes  By signing my name below, I, Edman Circle attest that this documentation has been prepared under  the direction and in the presence of Dr. Erick Blinks, M.D.   Electronically signed: Edman Circle  05/07/2015   Erick Blinks, M.D.  [facility] Pager [phone]. If 7PM-7AM, please contact night-coverage at [email or web address], password [facility] 05/07/2015, 10:37 AM      I have reviewed the above documentation for accuracy and completeness, and I agree with the above.  Jenetta Wease

## 2015-05-07 NOTE — Telephone Encounter (Signed)
Patient admitted to [facility] and will be transported to [facility] for a Cath today.  Unable to make stress test scheduled for today.

## 2015-05-07 NOTE — Progress Notes (Signed)
Report called to [facility] RN [place] 3E

## 2015-05-07 NOTE — Telephone Encounter (Signed)
S/w Abby in Nuc Med to cancel 2-day treadmill myoview

## 2015-05-08 ENCOUNTER — Encounter (HOSPITAL_COMMUNITY)
Admission: EM | Disposition: A | Discharge status: Home / Self Care | Payer: 59 | Source: Home / Self Care | Attending: Emergency Medicine

## 2015-05-08 ENCOUNTER — Encounter (HOSPITAL_COMMUNITY): Payer: Self-pay | Admitting: Interventional Cardiology

## 2015-05-08 ENCOUNTER — Other Ambulatory Visit: Payer: 59

## 2015-05-08 ENCOUNTER — Ambulatory Visit (HOSPITAL_COMMUNITY): Admission: AD | Admit: 2015-05-08 | Payer: Self-pay | Admitting: Interventional Cardiology

## 2015-05-08 DIAGNOSIS — R739 Hyperglycemia, unspecified: Secondary | ICD-10-CM | POA: Diagnosis not present

## 2015-05-08 DIAGNOSIS — E785 Hyperlipidemia, unspecified: Secondary | ICD-10-CM

## 2015-05-08 DIAGNOSIS — IMO0002 Reserved for concepts with insufficient information to code with codable children: Secondary | ICD-10-CM | POA: Diagnosis present

## 2015-05-08 DIAGNOSIS — E119 Type 2 diabetes mellitus without complications: Secondary | ICD-10-CM | POA: Diagnosis present

## 2015-05-08 DIAGNOSIS — R079 Chest pain, unspecified: Secondary | ICD-10-CM | POA: Diagnosis not present

## 2015-05-08 DIAGNOSIS — E1165 Type 2 diabetes mellitus with hyperglycemia: Secondary | ICD-10-CM | POA: Diagnosis present

## 2015-05-08 DIAGNOSIS — G4733 Obstructive sleep apnea (adult) (pediatric): Secondary | ICD-10-CM

## 2015-05-08 HISTORY — PX: CARDIAC CATHETERIZATION: SHX172

## 2015-05-08 LAB — BASIC METABOLIC PANEL
ANION GAP: 8 (ref 5–15)
BUN: 8 mg/dL (ref 6–20)
CALCIUM: 8.9 mg/dL (ref 8.9–10.3)
CO2: 28 mmol/L (ref 22–32)
CREATININE: 0.79 mg/dL (ref 0.61–1.24)
Chloride: 101 mmol/L (ref 101–111)
GLUCOSE: 129 mg/dL — AB (ref 65–99)
Potassium: 4 mmol/L (ref 3.5–5.1)
Sodium: 137 mmol/L (ref 135–145)

## 2015-05-08 LAB — GLUCOSE, CAPILLARY
GLUCOSE-CAPILLARY: 117 mg/dL — AB (ref 65–99)
GLUCOSE-CAPILLARY: 127 mg/dL — AB (ref 65–99)
Glucose-Capillary: 125 mg/dL — ABNORMAL HIGH (ref 65–99)

## 2015-05-08 LAB — HEMOGLOBIN A1C
Hgb A1c MFr Bld: 7.3 % — ABNORMAL HIGH (ref 4.8–5.6)
Mean Plasma Glucose: 163 mg/dL

## 2015-05-08 LAB — PROTIME-INR
INR: 1.13 (ref 0.00–1.49)
Prothrombin Time: 14.7 seconds (ref 11.6–15.2)

## 2015-05-08 SURGERY — LEFT HEART CATH AND CORONARY ANGIOGRAPHY

## 2015-05-08 MED ORDER — HEPARIN SODIUM (PORCINE) 1000 UNIT/ML IJ SOLN
INTRAMUSCULAR | Status: AC
  Filled 2015-05-08: qty 1

## 2015-05-08 MED ORDER — LIDOCAINE HCL (PF) 1 % IJ SOLN
INTRAMUSCULAR | Status: AC
  Filled 2015-05-08: qty 30

## 2015-05-08 MED ORDER — LIVING WELL WITH DIABETES BOOK
Freq: Once | Status: DC
  Filled 2015-05-08: qty 1

## 2015-05-08 MED ORDER — HEPARIN (PORCINE) IN NACL 2-0.9 UNIT/ML-% IJ SOLN
INTRAMUSCULAR | Status: AC
  Filled 2015-05-08: qty 1000

## 2015-05-08 MED ORDER — MIDAZOLAM HCL 2 MG/2ML IJ SOLN
INTRAMUSCULAR | Status: AC
  Filled 2015-05-08: qty 4

## 2015-05-08 MED ORDER — FENTANYL CITRATE (PF) 100 MCG/2ML IJ SOLN
INTRAMUSCULAR | Status: AC
  Filled 2015-05-08: qty 4

## 2015-05-08 MED ORDER — FENTANYL CITRATE (PF) 100 MCG/2ML IJ SOLN
INTRAMUSCULAR | Status: DC | PRN
  Administered 2015-05-08: 50 ug via INTRAVENOUS

## 2015-05-08 MED ORDER — ONDANSETRON HCL 4 MG/2ML IJ SOLN: 4.0000 mg | Freq: Four times a day (QID) | INTRAMUSCULAR | Status: DC | PRN

## 2015-05-08 MED ORDER — HEPARIN SODIUM (PORCINE) 1000 UNIT/ML IJ SOLN
INTRAMUSCULAR | Status: DC | PRN
  Administered 2015-05-08: 6000 [IU] via INTRAVENOUS

## 2015-05-08 MED ORDER — IOHEXOL 350 MG/ML SOLN
INTRAVENOUS | Status: DC | PRN
  Administered 2015-05-08: 65 mL via INTRA_ARTERIAL

## 2015-05-08 MED ORDER — ACETAMINOPHEN 325 MG PO TABS: 650.0000 mg | ORAL_TABLET | ORAL | Status: DC | PRN

## 2015-05-08 MED ORDER — VERAPAMIL HCL 2.5 MG/ML IV SOLN
INTRAVENOUS | Status: AC
  Filled 2015-05-08: qty 2

## 2015-05-08 MED ORDER — SODIUM CHLORIDE 0.9 % IJ SOLN: 3.0000 mL | INTRAMUSCULAR | Status: DC | PRN

## 2015-05-08 MED ORDER — METFORMIN HCL 850 MG PO TABS: 850.0000 mg | ORAL_TABLET | Freq: Two times a day (BID) | ORAL | Status: DC

## 2015-05-08 MED ORDER — NITROGLYCERIN 1 MG/10 ML FOR IR/CATH LAB
INTRA_ARTERIAL | Status: AC
  Filled 2015-05-08: qty 10

## 2015-05-08 MED ORDER — SODIUM CHLORIDE 0.9 % IV SOLN: 250.0000 mL | INTRAVENOUS | Status: DC | PRN

## 2015-05-08 MED ORDER — VERAPAMIL HCL 2.5 MG/ML IV SOLN
INTRAVENOUS | Status: DC | PRN
  Administered 2015-05-08: 11:00:00 via INTRA_ARTERIAL

## 2015-05-08 MED ORDER — MIDAZOLAM HCL 2 MG/2ML IJ SOLN
INTRAMUSCULAR | Status: DC | PRN
  Administered 2015-05-08: 2 mg via INTRAVENOUS

## 2015-05-08 MED ORDER — LIDOCAINE HCL (PF) 1 % IJ SOLN
INTRAMUSCULAR | Status: DC | PRN
  Administered 2015-05-08: 11:00:00

## 2015-05-08 MED ORDER — SODIUM CHLORIDE 0.9 % WEIGHT BASED INFUSION
3.0000 mL/kg/h | INTRAVENOUS | Status: AC
  Administered 2015-05-08: 3 mL/kg/h via INTRAVENOUS

## 2015-05-08 MED ORDER — ALPRAZOLAM 0.25 MG PO TABS
0.2500 mg | ORAL_TABLET | Freq: Once | ORAL | Status: AC
  Administered 2015-05-08: 0.25 mg via ORAL
  Filled 2015-05-08: qty 1

## 2015-05-08 MED ORDER — SODIUM CHLORIDE 0.9 % IJ SOLN: 3.0000 mL | Freq: Two times a day (BID) | INTRAMUSCULAR | Status: DC

## 2015-05-08 SURGICAL SUPPLY — 12 items
CATH INFINITI 5 FR JL3.5 (CATHETERS) ×2 IMPLANT
CATH INFINITI 5FR ANG PIGTAIL (CATHETERS) ×2 IMPLANT
CATH INFINITI JR4 5F (CATHETERS) ×2 IMPLANT
DEVICE RAD COMP TR BAND LRG (VASCULAR PRODUCTS) ×2 IMPLANT
GLIDESHEATH SLEND SS 6F .021 (SHEATH) ×2 IMPLANT
HOVERMATT SINGLE USE (MISCELLANEOUS) ×2 IMPLANT
KIT HEART LEFT (KITS) ×2 IMPLANT
PACK CARDIAC CATHETERIZATION (CUSTOM PROCEDURE TRAY) ×2 IMPLANT
SYR MEDRAD MARK V 150ML (SYRINGE) ×2 IMPLANT
TRANSDUCER W/STOPCOCK (MISCELLANEOUS) ×2 IMPLANT
TUBING CIL FLEX 10 FLL-RA (TUBING) ×2 IMPLANT
WIRE SAFE-T 1.5MM-J .035X260CM (WIRE) ×2 IMPLANT

## 2015-05-08 NOTE — Progress Notes (Signed)
Patient has home CPAP and states that he will place it on later on during the night. RT will continue to monitor.

## 2015-05-08 NOTE — Progress Notes (Addendum)
Inpatient Diabetes Program Recommendations  AACE/ADA: New Consensus Statement on Inpatient Glycemic Control (2015)  Target Ranges:  Prepandial:   less than 140 mg/dL      Peak postprandial:   less than 180 mg/dL (1-2 hours)      Critically ill patients:  140 - 180 mg/dL   Review of Glycemic Control  Diabetes history: None Outpatient Diabetes medications: None Current orders for Inpatient glycemic control: Novolog SENSITIVE every 4 hours.  Inpatient Diabetes Program Recommendations:  New onset diabetes. HgbA1c is 7.3%. staff RNs to start education by having patient watch DM videos [phone], ordered Living Well with Diabetes booklet, dietician consult ordered, staff RN's teach patient to check own blood sugars. Will need prescription for blood glucose meter, strips, and lancets. Order was entered into discharge plans for outpatient diabetes education [facility] [place]. This will need to signed by physician. Will need to be followed by PCP at discharge. Will continue to follow while in hospital. Smith Mince RN BSN CDE

## 2015-05-08 NOTE — Discharge Summary (Signed)
Hector Curry, is a 54 y.o. male  DOB 1961/01/17  MRN [id number].  Admission date:  05/06/2015  Admitting Physician  Pricilla Riffle, MD  Discharge Date:  05/08/2015   Primary MD  Marguarite Arbour, MD  Recommendations for primary care physician for things to follow:   Monitor secondary risk factors for CAD.  Newly diagnosed type 2 diabetes mellitus we'll start on leuko-phage from 05/10/2015. PCP to monitor CBGs   Admission Diagnosis  Chest pain at rest [R07.9]   Discharge Diagnosis  Chest pain at rest [R07.9]     Principal Problem:   Chest pain Active Problems:   Hyperlipidemia   OSA (obstructive sleep apnea)   Hyperglycemia   Chest pain at rest      Past Medical History  Diagnosis Date  . Hyperlipidemia   . Hypertension   . Family history of premature CAD     a. father passed from MI at 55  . Morbid obesity     a. s/p LAP-BAND  . History of tobacco abuse   . Palpitations     a. 48 hr Holter 04/2015: NSR w/ rare PVC, short runs of narrow complex tachycardiac, possible atrial tach, longest run 7 beats, PACs noted (2% of all beats 3600 total) they did not seem to correlate w/ significant arrythmia   . Kidney stones   . Allergic rhinitis   . Diverticulosis 10/03/2014  . GERD (gastroesophageal reflux disease)   . Hemorrhoids     a. internal hemorrhoids s/p surgery 1999  . Hyperplastic colon polyp 10/03/2014    a. x 2   . Inflammatory arthritis     a. CCP antibodies & x-rays negative. Rheumatoid factor 14, felt to be crystaline over RA or psoriatic  . OSA (obstructive sleep apnea)     a. on CPAP    Past Surgical History  Procedure Laterality Date  . Bariatric surgery      lap band   . Cholecystectomy    . Tonsillectomy    . Hemorrhoid surgery  1999  . Carpal tunnel release Left   .  Colonoscopy  06/27/2005  . Colonoscopy  10/03/2014  . Esophagogastroduodenoscopy  06/27/2005       HPI  from the history and physical done on the day of admission:   Hector Curry is a 54 y.o. male with h/o OSA on CPAP, HLD, family history of premature CAD with dad passing away from MI in late 32s. Patient presents to ED with chest pain. Pain located in substernal area, occurs intermittently. No radiation. Aching in quality.  Long story short, patient has already seen cardiology for this just a couple of days ago on the 9th. Eula Listen PA-C's note is in Epic. They had planned on stress test to be done as outpatient tomorrow already     Hospital Course:     1. Atypical chest pain. Ruled out MI, EKG nonacute, seen by cardiology underwent left heart cath showing nonocclusive CAD. Medical management for secondary risk factor modification advised.  Patient to follow with his primary cardiologist and PCP. Currently symptom free.   2. Hypertension. Continue common addition of beta blocker and HCTZ.   3. OSA. C Pap at night.    4. Newly diagnosed type 2 diabetes mellitus. Diabetic education provided, start on Glucophage from 05/11/2015 as he received dye for left heart cath. To follow with PCP for newly diagnosed diabetes mellitus and further management.    Lab Results  Component Value Date   HGBA1C 7.3* 05/06/2015     Discharge Condition: Stable  Follow UP  Follow-up Information    Follow up with SPARKS,JEFFREY D, MD. Schedule an appointment as soon as possible for a visit in 1 week.   Specialty:  Internal Medicine   Contact information:   [address] [place] [address] [phone]       Follow up with [facility] Schedule an appointment as soon as possible for a visit in 1 week.   Why:  Chest pain   Contact information:   [address] [place] [address]        Consults obtained - Cards  Diet and Activity  recommendation: See Discharge Instructions below  Discharge Instructions           Discharge Instructions    Diet - low sodium heart healthy    Complete by:  As directed      Discharge instructions    Complete by:  As directed   Follow with Primary MD SPARKS,JEFFREY D, MD in 7 days , follow for newly diagnosed type 2 diabetes mellitus  Get CBC, CMP, 2 view Chest X ray checked  by Primary MD next visit.    Activity: As tolerated with Full fall precautions use walker/cane & assistance as needed   Disposition Home **   Diet: Heart Healthy Low Carb.  For Heart failure patients - Check your Weight same time everyday, if you gain over 2 pounds, or you develop in leg swelling, experience more shortness of breath or chest pain, call your Primary MD immediately. Follow Cardiac Low Salt Diet and 1.5 lit/day fluid restriction.   On your next visit with your primary care physician please Get Medicines reviewed and adjusted.   Please request your Prim.MD to go over all Hospital Tests and Procedure/Radiological results at the follow up, please get all Hospital records sent to your Prim MD by signing hospital release before you go home.   If you experience worsening of your admission symptoms, develop shortness of breath, life threatening emergency, suicidal or homicidal thoughts you must seek medical attention immediately by calling 911 or calling your MD immediately  if symptoms less severe.  You Must read complete instructions/literature along with all the possible adverse reactions/side effects for all the Medicines you take and that have been prescribed to you. Take any new Medicines after you have completely understood and accpet all the possible adverse reactions/side effects.   Do not drive, operating heavy machinery, perform activities at heights, swimming or participation in water activities or provide baby sitting services if your were admitted for syncope or siezures until you have  seen by Primary MD or a Neurologist and advised to do so again.  Do not drive when taking Pain medications.    Do not take more than prescribed Pain, Sleep and Anxiety Medications  Special Instructions: If you have smoked or chewed Tobacco  in the last 2 yrs please stop smoking, stop any regular Alcohol  and  or any Recreational drug use.  Wear Seat belts while driving.   Please note  You were cared for by a hospitalist during your hospital stay. If you have any questions about your discharge medications or the care you received while you were in the hospital after you are discharged, you can call the unit and asked to speak with the hospitalist on call if the hospitalist that took care of you is not available. Once you are discharged, your primary care physician will handle any further medical issues. Please note that NO REFILLS for any discharge medications will be authorized once you are discharged, as it is imperative that you return to your primary care physician (or establish a relationship with a primary care physician if you do not have one) for your aftercare needs so that they can reassess your need for medications and monitor your lab values.     Increase activity slowly    Complete by:  As directed              Discharge Medications       Medication List    TAKE these medications        aspirin 81 MG tablet  Take 81 mg by mouth daily.     bisoprolol-hydrochlorothiazide 10-6.25 MG per tablet  Commonly known as:  ZIAC  Take 1 tablet by mouth daily.     cholecalciferol 1000 UNITS tablet  Commonly known as:  VITAMIN D  Take 1,000 Units by mouth daily.     Fish Oil 1000 MG Caps  Take 1,000 mg by mouth 2 (two) times daily.     fluticasone 50 MCG/ACT nasal spray  Commonly known as:  FLONASE  Place 1 spray into both nostrils daily as needed for allergies or rhinitis.     glucosamine-chondroitin 500-400 MG tablet  Take 1 tablet by mouth 2 (two) times daily.      hydroxypropyl methylcellulose / hypromellose 2.5 % ophthalmic solution  Commonly known as:  ISOPTO TEARS / GONIOVISC  Place 1 drop into both eyes as needed for dry eyes.     metFORMIN 850 MG tablet  Commonly known as:  GLUCOPHAGE  Take 1 tablet (850 mg total) by mouth 2 (two) times daily with a meal. Do not start before 16th of this month  Start taking on:  05/11/2015     multivitamin with minerals Tabs tablet  Take 1 tablet by mouth daily.     nitroGLYCERIN 0.4 MG SL tablet  Commonly known as:  NITROSTAT  Place 1 tablet (0.4 mg total) under the tongue every 5 (five) minutes as needed for chest pain. May take up to 3 tablets     Potassium Chloride ER 20 MEQ Tbcr  Take 20 mEq by mouth 2 (two) times daily.     vitamin E 400 UNIT capsule  Take 400 Units by mouth daily.        Major procedures and Radiology Reports - PLEASE review detailed and final reports for all details, in brief -   Left heart cath on 05/08/2015. Nonocclusive CAD medical management recommended.  Dg Chest Portable 1 View  05/06/2015   CLINICAL DATA:  Chest pain.  EXAM: PORTABLE CHEST - 1 VIEW  COMPARISON:  April 26, 2015.  FINDINGS: The heart size and mediastinal contours are within normal limits. Both lungs are clear. No pneumothorax or pleural effusion is noted. The visualized skeletal structures are unremarkable.  IMPRESSION: No acute cardiopulmonary abnormality seen.   Electronically Signed  By: Lupita Raider, M.D.   On: 05/06/2015 18:25    Micro Results      No results found for this or any previous visit (from the past 240 hour(s)).   Today   Subjective    Suyash Amory today has no headache,no chest abdominal pain,no new weakness tingling or numbness, feels much better wants to go home today.     Objective   Blood pressure 114/59, pulse 0, temperature 98.1 F (36.7 C), temperature source Oral, resp. rate 67, height 6\' 2"  (1.88 m), weight 146.24 kg (322 lb 6.4 oz), SpO2 95  %.   Intake/Output Summary (Last 24 hours) at 05/08/15 1245 Last data filed at 05/08/15 0813  Gross per 24 hour  Intake    720 ml  Output    400 ml  Net    320 ml    Exam Awake Alert, Oriented x 3, No new F.N deficits, Normal affect [place].AT,PERRAL Supple Neck,No JVD, No cervical lymphadenopathy appriciated.  Symmetrical Chest wall movement, Good air movement bilaterally, CTAB RRR,No Gallops,Rubs or new Murmurs, No Parasternal Heave +ve B.Sounds, Abd Soft, Non tender, No organomegaly appriciated, No rebound -guarding or rigidity. No Cyanosis, Clubbing or edema, No new Rash or bruise   Data Review   CBC w Diff:  Lab Results  Component Value Date   WBC 8.8 05/06/2015   HGB 15.4 05/06/2015   HCT 46.2 05/06/2015   PLT 198 05/06/2015   LYMPHOPCT 28 05/06/2015   MONOPCT 8 05/06/2015   EOSPCT 2 05/06/2015   BASOPCT 0 05/06/2015    CMP:  Lab Results  Component Value Date   NA 137 05/08/2015   K 4.0 05/08/2015   CL 101 05/08/2015   CO2 28 05/08/2015   BUN 8 05/08/2015   CREATININE 0.79 05/08/2015   PROT 7.1 05/06/2015   ALBUMIN 3.7 05/06/2015   BILITOT 0.6 05/06/2015   ALKPHOS 76 05/06/2015   AST 62* 05/06/2015   ALT 105* 05/06/2015  .   Total Time in preparing paper work, data evaluation and todays exam - 35 minutes  Leroy Sea M.D on 05/08/2015 at 12:45 PM  [facility]   Office  [phone]

## 2015-05-08 NOTE — Interval H&P Note (Signed)
Cath Lab Visit (complete for each Cath Lab visit)  Clinical Evaluation Leading to the Procedure:   ACS: Yes.    Non-ACS:    Anginal Classification: CCS IV  Anti-ischemic medical therapy: Minimal Therapy (1 class of medications)  Non-Invasive Test Results: Low-risk stress test findings: cardiac mortality <1%/year  Prior CABG: No previous CABG      History and Physical Interval Note:  05/08/2015 11:19 AM  Hector Curry  has presented today for surgery, with the diagnosis of cp  The various methods of treatment have been discussed with the patient and family. After consideration of risks, benefits and other options for treatment, the patient has consented to  Procedure(s): Left Heart Cath and Coronary Angiography (N/A) as a surgical intervention .  The patient's history has been reviewed, patient examined, no change in status, stable for surgery.  I have reviewed the patient's chart and labs.  Questions were answered to the patient's satisfaction.     Lora Glomski S.

## 2015-05-08 NOTE — Progress Notes (Signed)
Pt discharged home.  Discharge instructions completed. Medication regimen understood.  

## 2015-05-08 NOTE — Progress Notes (Signed)
Subjective: No further chest pain or SOB, + anxiety   Objective: Vital signs in last 24 hours: Temp:  [97.8 F (36.6 C)-98.4 F (36.9 C)] 97.8 F (36.6 C) (09/13 0413) Pulse Rate:  [62-78] 63 (09/13 0413) Resp:  [18] 18 (09/13 0413) BP: (121-137)/(75-90) 135/90 mmHg (09/13 0413) SpO2:  [97 %-98 %] 98 % (09/13 0413) Weight:  [322 lb 6.4 oz (146.24 kg)-323 lb 9.6 oz (146.784 kg)] 322 lb 6.4 oz (146.24 kg) (09/13 0413) Weight change: -1 lb 6.4 oz (-0.635 kg) Last BM Date: 05/07/15 Intake/Output from previous day: 09/12 0701 - 09/13 0700 In: 720 [P.O.:720] Out: 400 [Urine:400] Intake/Output this shift:    PE: General:Pleasant affect, NAD Skin:Warm and dry, brisk capillary refill HEENT:normocephalic, sclera clear, mucus membranes moist Neck:supple, no JVD Heart:S1S2 RRR without murmur, gallup, rub or click Lungs:clear without rales, rhonchi, or wheezes GNF:AOZHY, soft, non tender, + BS, do not palpate liver spleen or masses Ext:no lower ext edema, 2+ pedal pulses, 2+ radial pulses Neuro:alert and oriented, MAE, follows commands, + facial symmetry Tele:  SR with PACs  Lab Results:  Recent Labs  05/06/15 1750  WBC 8.8  HGB 15.4  HCT 46.2  PLT 198   BMET  Recent Labs  05/06/15 1750 05/08/15 0339  NA 137 137  K 3.9 4.0  CL 100* 101  CO2 29 28  GLUCOSE 244* 129*  BUN 12 8  CREATININE 1.05 0.79  CALCIUM 8.8* 8.9    Recent Labs  05/06/15 2128 05/06/15 2358  TROPONINI <0.03 <0.03    No results found for: CHOL, HDL, LDLCALC, LDLDIRECT, TRIG, CHOLHDL Lab Results  Component Value Date   HGBA1C 7.3* 05/06/2015     No results found for: TSH  Hepatic Function Panel  Recent Labs  05/06/15 1750  PROT 7.1  ALBUMIN 3.7  AST 62*  ALT 105*  ALKPHOS 76  BILITOT 0.6   No results for input(s): CHOL in the last 72 hours. No results for input(s): PROTIME in the last 72 hours.     Studies/Results: Dg Chest Portable 1 View  05/06/2015    CLINICAL DATA:  Chest pain.  EXAM: PORTABLE CHEST - 1 VIEW  COMPARISON:  April 26, 2015.  FINDINGS: The heart size and mediastinal contours are within normal limits. Both lungs are clear. No pneumothorax or pleural effusion is noted. The visualized skeletal structures are unremarkable.  IMPRESSION: No acute cardiopulmonary abnormality seen.   Electronically Signed   By: Lupita Raider, M.D.   On: 05/06/2015 18:25    Medications: I have reviewed the patient's current medications. Scheduled Meds: . aspirin EC  81 mg Oral Daily  . bisoprolol-hydrochlorothiazide  1 tablet Oral Daily  . enoxaparin (LOVENOX) injection  40 mg Subcutaneous Q24H  . insulin aspart  0-9 Units Subcutaneous 6 times per day  . potassium chloride  20 mEq Oral BID  . sodium chloride  3 mL Intravenous Q12H   Continuous Infusions:  PRN Meds:.sodium chloride, acetaminophen, magnesium hydroxide, morphine injection, nitroGLYCERIN, ondansetron (ZOFRAN) IV, sodium chloride  Assessment/Plan: Principal Problem:   Chest pain- negative MI,  For cath today.  Some anxiety will give xanax now. For cath today  Active Problems:   Hyperlipidemia- I do not see lipid panel but with diabetes would place on statin.   OSA (obstructive sleep apnea)- has machine from home   Hyperglycemia with hgbA1c at 7.3- + DM2 per IM     LOS: 1 day  Time spent with pt. :15 minutes. [facility]  Nurse Practitioner Certified Pager [phone] or after 5pm and on weekends call [phone] 05/08/2015, 9:16 AM

## 2015-05-08 NOTE — Progress Notes (Addendum)
Visited patient to talk with him about the new diagnosis Type 2 diabetes. Explained HgbA1C (7.3%) given handout for diabetes care.  States that both sides of his family have diabetes.  States that he works at [employer] and told him about the Link to Wellness program through the hospital that can help with diabetes education, high blood pressure, and high cholesterol.  Recommended outpatient DM education that would need to be cosigned by physician. To continue following while in the hospital. Smith Mince RN BSN CDE

## 2015-05-08 NOTE — Progress Notes (Signed)
  RD consulted for nutrition education regarding diabetes.   Lab Results  Component Value Date   HGBA1C 7.3* 05/06/2015    RD provided "Type 2 Diabetes Nutrition " handout from the Academy of [employer] Dietetics. Discussed different food groups and their effects on blood sugar, emphasizing carbohydrate-containing foods. Provided list of carbohydrates and recommended serving sizes of common foods.  Discussed importance of controlled and consistent carbohydrate intake throughout the day. Provided examples of ways to balance meals/snacks and encouraged intake of high-fiber, whole grain complex carbohydrates. Teach back method used.  Expect good compliance. Pt reports that he plans to decrease his portions sizes of grits, eat more vegetables, and eat fewer sweets.   Body mass index is 41.38 kg/(m^2). Pt meets criteria for Morbid Obesity based on current BMI.  Current diet order is Heart Healthy/Carb Modified, patient is consuming approximately 100% of meals at this time. Labs and medications reviewed. No further nutrition interventions warranted at this time. RD contact information provided. If additional nutrition issues arise, please re-consult RD.  Dorothea Ogle RD, LDN Inpatient Clinical Dietitian Pager: [phone] After Hours Pager: [phone]

## 2015-05-08 NOTE — H&P (View-Only) (Signed)
CARDIOLOGY CONSULT NOTE       Patient ID: EFOSA TREICHLER MRN: [id number] DOB/AGE: 30-Aug-1960 54 y.o.  Admit date: 05/06/2015 Referring Physician:  Kerry Hough Primary Physician: Marguarite Arbour, MD Primary Cardiologist:  Mariah Milling Reason for Consultation: Chest paon  Active Problems:   Chest pain   HPI:  54 y.o. obese male with family history of premature CAD dad with MI in 41s.  Previous chest pain w/u multiple times.  Last with myovue end of last your normal.  Previously seen by Gavin Potters in [place] with normal cath 5-7 yrs ago.  Seen by Dr Mariah Milling and Eula Listen a few weeks ago and outpatient stress test scheduled for today but he was admitted to [facility].  Has palpitations then left sided pain.  Non exertional lasted hours relieved with morphine.  Recurrent.  Has OSA with CPAP.  Pain has been for weeks Really never went away since last myovue.  Other CRF;s include HTN and elevated lipids.  Previousl lap band but still significantly overweight.  Currently pain free this am had Holter this month with rate PVC and short run of atrial tachycardia not correlated with symptoms  ROS All other systems reviewed and negative except as noted above  Past Medical History  Diagnosis Date  . Hyperlipidemia   . Hypertension   . Family history of premature CAD     a. father passed from MI at 4  . Morbid obesity     a. s/p LAP-BAND  . History of tobacco abuse   . Palpitations     a. 48 hr Holter 04/2015: NSR w/ rare PVC, short runs of narrow complex tachycardiac, possible atrial tach, longest run 7 beats, PACs noted (2% of all beats 3600 total) they did not seem to correlate w/ significant arrythmia   . Kidney stones   . Allergic rhinitis   . Diverticulosis 10/03/2014  . GERD (gastroesophageal reflux disease)   . Hemorrhoids     a. internal hemorrhoids s/p surgery 1999  . Hyperplastic colon polyp 10/03/2014    a. x 2   . Inflammatory arthritis     a. CCP antibodies & x-rays negative. Rheumatoid  factor 14, felt to be crystaline over RA or psoriatic  . OSA (obstructive sleep apnea)     a. on CPAP    Family History  Problem Relation Age of Onset  . Heart attack Father 87  . Hypertension Sister   . CAD Father   . Hypertension Mother   . Diabetes Mother   . Diabetes Other   . Hypertension Other   . Heart disease Other     Social History   Social History  . Marital Status: Married    Spouse Name: N/A  . Number of Children: N/A  . Years of Education: N/A   Occupational History  . Not on file.   Social History Main Topics  . Smoking status: Former Smoker -- 1.00 packs/day for 15 years    Types: Cigarettes    Quit date: 08/05/1989  . Smokeless tobacco: Never Used  . Alcohol Use: No  . Drug Use: No  . Sexual Activity: Not on file   Other Topics Concern  . Not on file   Social History Narrative    Past Surgical History  Procedure Laterality Date  . Bariatric surgery      lap band   . Cholecystectomy    . Tonsillectomy    . Hemorrhoid surgery  1999  . Carpal tunnel release Left   .  Colonoscopy  06/27/2005  . Colonoscopy  10/03/2014  . Esophagogastroduodenoscopy  06/27/2005     . aspirin EC  81 mg Oral Daily  . bisoprolol-hydrochlorothiazide  1 tablet Oral Daily  . enoxaparin (LOVENOX) injection  40 mg Subcutaneous Q24H  . insulin aspart  0-9 Units Subcutaneous 6 times per day  . potassium chloride  20 mEq Oral BID      Physical Exam: Blood pressure 143/76, pulse 71, temperature 98.3 F (36.8 C), temperature source Oral, resp. rate 18, height 6\' 2"  (1.88 m), weight 148.961 kg (328 lb 6.4 oz), SpO2 97 %.    Affect appropriate Obese white male  HEENT: normal Neck supple with no adenopathy JVP normal no bruits no thyromegaly Lungs clear with no wheezing and good diaphragmatic motion Heart:  S1/S2 no murmur, no rub, gallop or click PMI normal Abdomen: benighn, BS positve, no tenderness, no AAA no bruit.  No HSM or HJR Distal pulses intact with no  bruits No edema Neuro non-focal Skin warm and dry No muscular weakness   Labs:   Lab Results  Component Value Date   WBC 8.8 05/06/2015   HGB 15.4 05/06/2015   HCT 46.2 05/06/2015   MCV 89.9 05/06/2015   PLT 198 05/06/2015    Recent Labs Lab 05/06/15 1750  NA 137  K 3.9  CL 100*  CO2 29  BUN 12  CREATININE 1.05  CALCIUM 8.8*  PROT 7.1  BILITOT 0.6  ALKPHOS 76  ALT 105*  AST 62*  GLUCOSE 244*   Lab Results  Component Value Date   TROPONINI <0.03 05/06/2015   No results found for: CHOL No results found for: HDL No results found for: LDLCALC No results found for: TRIG No results found for: CHOLHDL No results found for: LDLDIRECT    Radiology: Dg Chest 2 View  04/26/2015   CLINICAL DATA:  Chest pain and dyspnea onset this morning  EXAM: CHEST  2 VIEW  COMPARISON:  09/12/2011  FINDINGS: The heart size and mediastinal contours are within normal limits. Both lungs are clear. The visualized skeletal structures are unremarkable.  IMPRESSION: No active cardiopulmonary disease.   Electronically Signed   By: Ellery Plunk M.D.   On: 04/26/2015 06:37   Mr Maxine Glenn Head Wo Contrast  04/26/2015   CLINICAL DATA:  Intermittent left-sided numbness. History of hypertension and hyperlipidemia.  EXAM: MRI HEAD WITHOUT CONTRAST  MRA HEAD WITHOUT CONTRAST  TECHNIQUE: Multiplanar, multiecho pulse sequences of the brain and surrounding structures were obtained without intravenous contrast. Angiographic images of the head were obtained using MRA technique without contrast.  COMPARISON:  None.  FINDINGS: MRI HEAD FINDINGS  There is a punctate focus of susceptibility artifact in the inferior right cerebellum, nonspecific but may reflect a focus of chronic microhemorrhage or calcification. There is no other evidence intracranial hemorrhage. There is no evidence of acute infarct, mass, midline shift, or extra-axial fluid collection. Ventricles and sulci are normal for age. Punctate foci of T2  hyperintensity in the subcortical cerebral white matter are nonspecific but may reflect minimal chronic small vessel ischemic disease.  Orbits are unremarkable. Paranasal sinuses and mastoid air cells are clear. Major intracranial vascular flow voids are preserved.  MRA HEAD FINDINGS  Visualized distal vertebral arteries are patent with the left being dominant. PICA origins are patent. Right AICA and bilateral SCA origins are patent. Basilar artery is patent without stenosis. PCAs are patent with branch vessel irregularity and attenuation but no proximal stenosis. Posterior communicating arteries are not  clearly identified.  Internal carotid arteries are patent with apparent moderate stenosis of the proximal left supraclinoid ICA, although the degree of stenosis may be overestimated due to the course of the vessel at this location. No intracranial ICA stenosis on the right. Anterior communicating artery is patent. ACAs and MCAs are patent with mild to moderate branch vessel irregularity suggestive of atherosclerosis but no high-grade proximal stenosis. No intracranial aneurysm is identified.  Neck MRA could not be obtained due to patient body habitus.  IMPRESSION: 1. No acute intracranial abnormality. At most minimal chronic small vessel ischemic disease in the cerebral white matter. 2. No major intracranial arterial occlusion or flow limiting proximal stenosis. Branch vessel atherosclerosis and possibly moderate left supraclinoid ICA stenosis.   Electronically Signed   By: Sebastian Ache M.D.   On: 04/26/2015 08:47   Mr Brain Wo Contrast  04/26/2015   CLINICAL DATA:  Intermittent left-sided numbness. History of hypertension and hyperlipidemia.  EXAM: MRI HEAD WITHOUT CONTRAST  MRA HEAD WITHOUT CONTRAST  TECHNIQUE: Multiplanar, multiecho pulse sequences of the brain and surrounding structures were obtained without intravenous contrast. Angiographic images of the head were obtained using MRA technique without  contrast.  COMPARISON:  None.  FINDINGS: MRI HEAD FINDINGS  There is a punctate focus of susceptibility artifact in the inferior right cerebellum, nonspecific but may reflect a focus of chronic microhemorrhage or calcification. There is no other evidence intracranial hemorrhage. There is no evidence of acute infarct, mass, midline shift, or extra-axial fluid collection. Ventricles and sulci are normal for age. Punctate foci of T2 hyperintensity in the subcortical cerebral white matter are nonspecific but may reflect minimal chronic small vessel ischemic disease.  Orbits are unremarkable. Paranasal sinuses and mastoid air cells are clear. Major intracranial vascular flow voids are preserved.  MRA HEAD FINDINGS  Visualized distal vertebral arteries are patent with the left being dominant. PICA origins are patent. Right AICA and bilateral SCA origins are patent. Basilar artery is patent without stenosis. PCAs are patent with branch vessel irregularity and attenuation but no proximal stenosis. Posterior communicating arteries are not clearly identified.  Internal carotid arteries are patent with apparent moderate stenosis of the proximal left supraclinoid ICA, although the degree of stenosis may be overestimated due to the course of the vessel at this location. No intracranial ICA stenosis on the right. Anterior communicating artery is patent. ACAs and MCAs are patent with mild to moderate branch vessel irregularity suggestive of atherosclerosis but no high-grade proximal stenosis. No intracranial aneurysm is identified.  Neck MRA could not be obtained due to patient body habitus.  IMPRESSION: 1. No acute intracranial abnormality. At most minimal chronic small vessel ischemic disease in the cerebral white matter. 2. No major intracranial arterial occlusion or flow limiting proximal stenosis. Branch vessel atherosclerosis and possibly moderate left supraclinoid ICA stenosis.   Electronically Signed   By: Sebastian Ache  M.D.   On: 04/26/2015 08:47   Dg Chest Portable 1 View  05/06/2015   CLINICAL DATA:  Chest pain.  EXAM: PORTABLE CHEST - 1 VIEW  COMPARISON:  April 26, 2015.  FINDINGS: The heart size and mediastinal contours are within normal limits. Both lungs are clear. No pneumothorax or pleural effusion is noted. The visualized skeletal structures are unremarkable.  IMPRESSION: No acute cardiopulmonary abnormality seen.   Electronically Signed   By: Lupita Raider, M.D.   On: 05/06/2015 18:25    EKG:  NSR normal Telemetry:  NSR   ASSESSMENT AND PLAN:  Chest Pain: recurrent despite normal myovue end of last year.  Relief with morphine  R/O no acute ECG changes Given family history and CRF;s and recurrence despite normal myouve will transfer to [facility] for cath.  Will need to be Tuesday 3rd case with Dr Abe People.  Orders written cath lab called  OSA: continue CPAP  Obesity:  Post lap band with recurrence discussed diet and exercise if cath shows no obstructive disease   Signed: Charlton Haws 05/07/2015, 8:08 AM

## 2015-05-08 NOTE — Discharge Instructions (Signed)
Follow with Primary MD SPARKS,JEFFREY D, MD in 7 days , follow for newly diagnosed type 2 diabetes mellitus  Get CBC, CMP, 2 view Chest X ray checked  by Primary MD next visit.    Activity: As tolerated with Full fall precautions use walker/cane & assistance as needed   Disposition Home **   Diet: Heart Healthy Low Carb.  For Heart failure patients - Check your Weight same time everyday, if you gain over 2 pounds, or you develop in leg swelling, experience more shortness of breath or chest pain, call your Primary MD immediately. Follow Cardiac Low Salt Diet and 1.5 lit/day fluid restriction.   On your next visit with your primary care physician please Get Medicines reviewed and adjusted.   Please request your Prim.MD to go over all Hospital Tests and Procedure/Radiological results at the follow up, please get all Hospital records sent to your Prim MD by signing hospital release before you go home.   If you experience worsening of your admission symptoms, develop shortness of breath, life threatening emergency, suicidal or homicidal thoughts you must seek medical attention immediately by calling 911 or calling your MD immediately  if symptoms less severe.  You Must read complete instructions/literature along with all the possible adverse reactions/side effects for all the Medicines you take and that have been prescribed to you. Take any new Medicines after you have completely understood and accpet all the possible adverse reactions/side effects.   Do not drive, operating heavy machinery, perform activities at heights, swimming or participation in water activities or provide baby sitting services if your were admitted for syncope or siezures until you have seen by Primary MD or a Neurologist and advised to do so again.  Do not drive when taking Pain medications.    Do not take more than prescribed Pain, Sleep and Anxiety Medications  Special Instructions: If you have smoked or chewed  Tobacco  in the last 2 yrs please stop smoking, stop any regular Alcohol  and or any Recreational drug use.  Wear Seat belts while driving.   Please note  You were cared for by a hospitalist during your hospital stay. If you have any questions about your discharge medications or the care you received while you were in the hospital after you are discharged, you can call the unit and asked to speak with the hospitalist on call if the hospitalist that took care of you is not available. Once you are discharged, your primary care physician will handle any further medical issues. Please note that NO REFILLS for any discharge medications will be authorized once you are discharged, as it is imperative that you return to your primary care physician (or establish a relationship with a primary care physician if you do not have one) for your aftercare needs so that they can reassess your need for medications and monitor your lab values.

## 2015-05-09 MED FILL — Nitroglycerin IV Soln 100 MCG/ML in D5W: INTRA_ARTERIAL | Qty: 10 | Status: AC

## 2015-05-10 ENCOUNTER — Telehealth: Payer: Self-pay | Admitting: Cardiovascular Disease

## 2015-05-10 NOTE — Telephone Encounter (Signed)
Patient has not received 30 day monitor in mail yet.  Please call patient to discuss.  It is ok to leave vm on [phone] if no answer .

## 2015-05-10 NOTE — Telephone Encounter (Signed)
Left message and CB number on pt VM.  Provided [employer] numbers, [phone]; [phone] Per [employer] office, they have attempted to contact pt at [phone] and [phone]

## 2015-05-14 ENCOUNTER — Encounter: Payer: Self-pay | Admitting: Physician Assistant

## 2015-05-15 ENCOUNTER — Encounter: Payer: Self-pay | Admitting: Physician Assistant

## 2015-05-15 ENCOUNTER — Ambulatory Visit (INDEPENDENT_AMBULATORY_CARE_PROVIDER_SITE_OTHER): Payer: 59 | Admitting: Physician Assistant

## 2015-05-15 VITALS — BP 120/70 | HR 69 | Ht 74.0 in | Wt 318.0 lb

## 2015-05-15 DIAGNOSIS — E1165 Type 2 diabetes mellitus with hyperglycemia: Secondary | ICD-10-CM | POA: Diagnosis not present

## 2015-05-15 DIAGNOSIS — R079 Chest pain, unspecified: Secondary | ICD-10-CM

## 2015-05-15 DIAGNOSIS — R002 Palpitations: Secondary | ICD-10-CM

## 2015-05-15 DIAGNOSIS — E781 Pure hyperglyceridemia: Secondary | ICD-10-CM

## 2015-05-15 DIAGNOSIS — R0602 Shortness of breath: Secondary | ICD-10-CM

## 2015-05-15 DIAGNOSIS — I2510 Atherosclerotic heart disease of native coronary artery without angina pectoris: Secondary | ICD-10-CM

## 2015-05-15 DIAGNOSIS — IMO0002 Reserved for concepts with insufficient information to code with codable children: Secondary | ICD-10-CM

## 2015-05-15 MED ORDER — DILTIAZEM HCL 30 MG PO TABS: 30.0000 mg | ORAL_TABLET | Freq: Three times a day (TID) | ORAL | Status: DC

## 2015-05-15 NOTE — Patient Instructions (Addendum)
Medication Instructions:  Please start diltiazem 30 mg three times daily  Labwork: None  Testing/Procedures: We will reschedule your ECHO  Follow-Up: Please keep your scheduled follow-up with Dr. Mariah Milling  Echocardiogram An echocardiogram, or echocardiography, uses sound waves (ultrasound) to produce an image of your heart. The echocardiogram is simple, painless, obtained within a short period of time, and offers valuable information to your health care provider. The images from an echocardiogram can provide information such as:  Evidence of coronary artery disease (CAD).  Heart size.  Heart muscle function.  Heart valve function.  Aneurysm detection.  Evidence of a past heart attack.  Fluid buildup around the heart.  Heart muscle thickening.  Assess heart valve function. LET [facility] CARE PROVIDER KNOW ABOUT:  Any allergies you have.  All medicines you are taking, including vitamins, herbs, eye drops, creams, and over-the-counter medicines.  Previous problems you or members of your family have had with the use of anesthetics.  Any blood disorders you have.  Previous surgeries you have had.  Medical conditions you have.  Possibility of pregnancy, if this applies. BEFORE THE PROCEDURE  No special preparation is needed. Eat and drink normally.  PROCEDURE   In order to produce an image of your heart, gel will be applied to your chest and a wand-like tool (transducer) will be moved over your chest. The gel will help transmit the sound waves from the transducer. The sound waves will harmlessly bounce off your heart to allow the heart images to be captured in real-time motion. These images will then be recorded.  You may need an IV to receive a medicine that improves the quality of the pictures. AFTER THE PROCEDURE You may return to your normal schedule including diet, activities, and medicines, unless your health care provider tells you otherwise. Document  Released: 08/08/2000 Document Revised: 12/26/2013 Document Reviewed: 04/18/2013 [facility] Patient Information 2015 [place], [place]. This information is not intended to replace advice given to you by your health care provider. Make sure you discuss any questions you have with your health care provider.

## 2015-05-15 NOTE — Progress Notes (Signed)
Cardiology Hospital Follow Up Note:   Date of Encounter: 05/15/2015  ID: Hector Curry, DOB Dec 31, 1960, MRN [id number]  PCP: Marguarite Arbour, MD Primary Cardiologist: Dr. Mariah Milling, MD  Chief Complaint  Patient presents with  . other    [facility] follow up s/p cardiac cath and holter monitor. Pt. c/o chest pain at 12:15 am.      HPI:  54 year old male with history of non obstructive CAD by cardiac cath on 05/08/2015, obesity s/p LAP-BAND, OSA on CPAP, prior tobacco abuse, morbid obesity, HLD, family history of premature CAD with father passing away from MI in his late 91's, and prior history of chest pain/SOB with typical and atypical features who returns to clinic today for routine hospital follow up post cardiac cath as above.   Patient previously underwent stress testing and echocardiogram approximately 2008-2010 which were reportedly normal. He presented to [facility] 05/2014 to establish care with complaints of chest pain typically presenting at nighttime, sometimes during the day. Symptoms were rare, but would wake him up from sleep. He was not exercising 2/2 chronic joint issues. He underwent nuclear stress test in 06/2014 that showed no significant ischemia, no artifact was noted, EF 43%, no EKG changes, overall low risk scan.   He called 04/26/2015 stating he had been seen at [facility] ED with palpitations x 2 weeks. Also noted to have one day of chest heaviness, left sided numbness, and dizziness while making the bed that lasted just a few minutes. He had a second episode that lasted approximately 15 minutes and a third episode while driving to the ED. Troponin negative x 2, MRI head/MRA head negative for acute findings. CXR without acute cardiopulmonary disease. K+ 3.4. CBC unremarkable. He was discharged home from the ED based on the above findings. He had a follow up 48 hour Holter monitor per Dr. Mariah Milling post ED visit that showed NSR with rare PVC, short runs of narrow complex  tachycardia, possible atrial tachycardia, 7 beats longest run, PACs noted (2% of all beats, 3600 total), this did not seem to correlate with any significant arrythmia.   In follow up on 9/9, he reported continued stuttering chest pain, palpitations, and dizziness. Symptoms occuring mainly when he is at rest or sleeping and wake him from a "good sleep." He last had an episode of chest pain while watching the Panthers vs Broncos Thursday night football game. Chest pain lasted from 10 Pm to 1 AM when he went to bed. Pain was associated with palpitations, and diaphoresis. He took 4 baby aspirin. He did not seek medical care at that time. Pain subsided upon going to bed. Pain not associated with exertion. 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 Weight has been stable. He was scheduled for outpatient treadmill Myoview, though he presented to [facility] on 9/11, the day before this test was to be performed with complaints of chest pain and was transfered to [facility]. Troponin was negative x 3. He underwent cardiac cath that showed nonocclusive CAD and medical management for secondary risk factor modification was advised. He was also diagnosed with new onset diabetes with A1C of 7.3% and started on metformin.   He has continued to have paroxysmal palpitations occuring mostly while sleeping, though occasionally while awake. He had bad episode of palpitations this morning around 12:15 AM that woke him from sleep, had been sleeping for 3 hours. Episode lasted for several  minutes and self resolved. Episode was not recorded on his event monitor per [employer] as the patient did not push the button and did not reach the thresh hold of automatic recording. He has since had 2 episodes of palpitations this morning that each lasted for approximately 30 seconds. Each time he has palpitations they are associated with chest pain. He focuses on his breathing, but denies any  SOB, diaphoresis, nausea, vomiting, presyncope, or syncope. He currently denies any palpitations. He continues to take his medications daily without issues. He is quite upset his episode this morning was not recorded by the monitoring company.      Past Medical History  Diagnosis Date  . Hyperlipidemia   . Hypertension   . Family history of premature CAD     a. father passed from MI at 47  . Morbid obesity     a. s/p LAP-BAND  . History of tobacco abuse   . Palpitations     a. 48 hr Holter 04/2015: NSR w/ rare PVC, short runs of narrow complex tachycardiac, possible atrial tach, longest run 7 beats, PACs noted (2% of all beats 3600 total) they did not seem to correlate w/ significant arrythmia   . Kidney stones   . Allergic rhinitis   . Diverticulosis 10/03/2014  . GERD (gastroesophageal reflux disease)   . Hemorrhoids     a. internal hemorrhoids s/p surgery 1999  . Hyperplastic colon polyp 10/03/2014    a. x 2   . Inflammatory arthritis     a. CCP antibodies & x-rays negative. Rheumatoid factor 14, felt to be crystaline over RA or psoriatic  . OSA (obstructive sleep apnea)     a. on CPAP  . Coronary artery disease, non-occlusive     a. cath 05/08/2015: no significant CAD, LVEF nl, cont preventative therapy  : Past Surgical History  Procedure Laterality Date  . Bariatric surgery      lap band   . Cholecystectomy    . Tonsillectomy    . Hemorrhoid surgery  1999  . Carpal tunnel release Left   . Colonoscopy  06/27/2005  . Colonoscopy  10/03/2014  . Esophagogastroduodenoscopy  06/27/2005  . Cardiac catheterization N/A 05/08/2015    Procedure: Left Heart Cath and Coronary Angiography;  Surgeon: Corky Crafts, MD;  Location: [facility] INVASIVE CV LAB;  Service: Cardiovascular;  Laterality: N/A;  : Family History  Problem Relation Age of Onset  . Heart attack Father 23  . Hypertension Sister   . CAD Father   . Hypertension Mother   . Diabetes Mother   . Diabetes Other   .  Hypertension Other   . Heart disease Other   :  reports that he quit smoking about 25 years ago. His smoking use included Cigarettes. He has a 15 pack-year smoking history. He has never used smokeless tobacco. He reports that he does not drink alcohol or use illicit drugs.:   Allergies:  No Known Allergies   Home Medications:  Current Outpatient Prescriptions  Medication Sig Dispense Refill  . aspirin 81 MG tablet Take 81 mg by mouth daily.    . bisoprolol-hydrochlorothiazide (ZIAC) 10-6.25 MG per tablet Take 1 tablet by mouth daily. 30 tablet 3  . cholecalciferol (VITAMIN D) 1000 UNITS tablet Take 1,000 Units by mouth daily.     . fluticasone (FLONASE) 50 MCG/ACT nasal spray Place 1 spray into both nostrils daily as needed for allergies or rhinitis.    Marland Kitchen glucosamine-chondroitin 500-400 MG tablet Take 1  tablet by mouth 2 (two) times daily.     . hydroxypropyl methylcellulose / hypromellose (ISOPTO TEARS / GONIOVISC) 2.5 % ophthalmic solution Place 1 drop into both eyes as needed for dry eyes.    . metFORMIN (GLUCOPHAGE) 850 MG tablet Take 1 tablet (850 mg total) by mouth 2 (two) times daily with a meal. Do not start before 16th of this month 60 tablet 0  . Multiple Vitamin (MULTIVITAMIN WITH MINERALS) TABS tablet Take 1 tablet by mouth daily.    . nitroGLYCERIN (NITROSTAT) 0.4 MG SL tablet Place 1 tablet (0.4 mg total) under the tongue every 5 (five) minutes as needed for chest pain. May take up to 3 tablets 25 tablet 0  . Omega-3 Fatty Acids (FISH OIL) 1000 MG CAPS Take 1,000 mg by mouth 2 (two) times daily.     . potassium chloride 20 MEQ TBCR Take 20 mEq by mouth 2 (two) times daily. 60 tablet 3  . vitamin E 400 UNIT capsule Take 400 Units by mouth daily.     No current facility-administered medications for this visit.     Review of Systems:  Review of Systems  Constitutional: Negative for fever, chills, weight loss, malaise/fatigue and diaphoresis.  HENT: Negative for  congestion.   Eyes: Negative for discharge and redness.  Respiratory: Positive for shortness of breath. Negative for cough, hemoptysis, sputum production and wheezing.        Associated with palpitations   Cardiovascular: Positive for chest pain and palpitations. Negative for orthopnea, claudication, leg swelling and PND.       CP associated with palpitations   Gastrointestinal: Negative for heartburn, nausea and vomiting.  Musculoskeletal: Negative for falls.  Skin: Negative for rash.  Neurological: Negative for dizziness, tingling, tremors, sensory change, speech change, focal weakness and weakness.  Endo/Heme/Allergies: Does not bruise/bleed easily.  Psychiatric/Behavioral: The patient is nervous/anxious.      Physical Exam:  Blood pressure 120/70, pulse 69, height  (1.88 m), weight 318 lb (144.244 kg). BMI: Body mass index is 40.81 kg/(m^2). General: Pleasant, NAD. Psych: Normal affect. Responds to questions with normal affect.  Neuro: Alert and oriented X 3. Moves all extremities spontaneously. HEENT: Normocephalic, atraumatic. EOM intact bilaterally. Sclera anicteric.  Neck: Trachea midline. Supple without bruits or JVD. Lungs:  Respirations regular and unlabored, CTA bilaterally without wheezing, crackles, or rhonchi.  Heart: RRR, normal s3, s4. No murmurs, rubs, or gallops.  Abdomen: Obese, soft, non-tender, non-distended, BS + x 4.  Extremities: No clubbing, cyanosis or edema. DP/PT/Radials 2+ and equal bilaterally. Cath site well healed with no bruising, bleeding, or TTP. Distal pulse 2+.    Accessory Clinical Findings:  EKG: NSR, 69 bpm, no significant st/t changes   Recent Labs: 05/06/2015: ALT 105*; Hemoglobin 15.4; Platelets 198 05/08/2015: BUN 8; Creatinine, Ser 0.79; Potassium 4.0; Sodium 137  No results found for: CHOL, TRIG, HDL, CHOLHDL, VLDL, LDLCALC, LDLDIRECT  Weights: Wt Readings from Last 3 Encounters:  05/15/15 318 lb (144.244 kg)  05/08/15 322 lb  6.4 oz (146.24 kg)  05/04/15 328 lb 8 oz (149.007 kg)    Estimated Creatinine Clearance: 163.5 mL/min (by C-G formula based on Cr of 0.79).   Other studies Reviewed: Additional studies/ records that were reviewed today include: previous office note and [facility] admission.  Assessment & Plan:  1. Palpitations: -Paroxysmal in nature -Holter in the beginning of September 2016 showed NSR with rare PVC and short runs of narrow complex tachycardia, possible atrial tachycardia at 7 beats, PACs -  He just started wearing the event monitor the prior evening and had an episode of tachy-palpitations, unfortunately this was not recorded by [employer] as the patient did not push the button at the heart rate/rhythm did not meet automatic recording standards -He is quite symptomatic with his tachy-palpitations with associated chest pain, though recent cardiac cath on 05/08/2015 showed nonocclusive CAD -Add diltiazem 30 mg tid in an effort to decrease his tachy-palpitations  -Continues bisoprolol/HCTZ 10/6.25 mg daily -No documented Afib at this time, though patient is quite concerned about this. Would continue to hold any full dose anticoagulation given there has been no documented Afib to date. Should cardiac monitoring reveal PAF would then need to further discuss risks vs benefits of long term anticoagulation with the patient -Continue 30 day event monitor, our office staff has called [employer] multiple times today in an effort to gain access to his episode of tachy-palpitations. Unfortunately, this could not be accessed. Their rep is still looking into this for [place] and will let us know if we can see any data from this morning -Has sleep study scheduled through PCP, perhaps this is playing a role in his tachy-palpitations   2. Chest pain at rest: -In the setting of his tachy-palpitations, none currently -Recent cardiac cath that showed nonocclusive CAD -Check echo to evaluate LV function, wall motion, valvular  pathology, and right-sided pressure   3. Nonocclusive CAD: -By cardiac cath 05/08/2015 -Continue aggressive secondary prevention measures -Aspirin 81 mg daily, Ziac 10/6.25 mg daily   4. Hypertriglyceridemia: -Fish oil -LDL 67 per Care Everywhere -Consider low dose statin given recent diagnosis of DM  5. Recently diagnosed diabetes: -Per PCP   Dispo: -Follow up with Dr. Mariah Milling, MD with previously scheduled appointment   Current medicines are reviewed at length with the patient today.  The patient did not have any concerns regarding medicines.   Eula Listen, PA-C [facility] [address] [place], [place] [address] [phone] [facility] 05/15/2015, 2:20 PM

## 2015-05-23 ENCOUNTER — Encounter: Payer: Self-pay | Admitting: Physician Assistant

## 2015-05-23 ENCOUNTER — Encounter: Payer: Self-pay | Admitting: Cardiovascular Disease

## 2015-05-24 ENCOUNTER — Ambulatory Visit (HOSPITAL_COMMUNITY)
Admission: RE | Admit: 2015-05-24 | Discharge: 2015-05-24 | Disposition: A | Discharge status: Home / Self Care | Payer: 59 | Source: Ambulatory Visit | Attending: Cardiovascular Disease | Admitting: Cardiovascular Disease

## 2015-05-24 ENCOUNTER — Other Ambulatory Visit: Payer: 59

## 2015-05-24 DIAGNOSIS — I340 Nonrheumatic mitral (valve) insufficiency: Secondary | ICD-10-CM | POA: Diagnosis not present

## 2015-05-24 DIAGNOSIS — R079 Chest pain, unspecified: Secondary | ICD-10-CM | POA: Diagnosis not present

## 2015-05-24 DIAGNOSIS — R42 Dizziness and giddiness: Secondary | ICD-10-CM | POA: Diagnosis not present

## 2015-05-24 DIAGNOSIS — R002 Palpitations: Secondary | ICD-10-CM | POA: Diagnosis present

## 2015-05-24 DIAGNOSIS — I517 Cardiomegaly: Secondary | ICD-10-CM | POA: Diagnosis not present

## 2015-05-28 ENCOUNTER — Other Ambulatory Visit: Payer: Self-pay

## 2015-05-28 DIAGNOSIS — I340 Nonrheumatic mitral (valve) insufficiency: Secondary | ICD-10-CM

## 2015-06-15 ENCOUNTER — Ambulatory Visit (INDEPENDENT_AMBULATORY_CARE_PROVIDER_SITE_OTHER): Payer: 59

## 2015-06-15 DIAGNOSIS — R079 Chest pain, unspecified: Secondary | ICD-10-CM

## 2015-06-15 DIAGNOSIS — R002 Palpitations: Secondary | ICD-10-CM

## 2015-06-15 DIAGNOSIS — R42 Dizziness and giddiness: Secondary | ICD-10-CM

## 2015-06-21 ENCOUNTER — Ambulatory Visit (INDEPENDENT_AMBULATORY_CARE_PROVIDER_SITE_OTHER): Payer: 59 | Admitting: Cardiovascular Disease

## 2015-06-21 ENCOUNTER — Encounter: Payer: Self-pay | Admitting: Cardiovascular Disease

## 2015-06-21 VITALS — BP 120/72 | HR 66 | Ht 74.0 in | Wt 319.5 lb

## 2015-06-21 DIAGNOSIS — E1165 Type 2 diabetes mellitus with hyperglycemia: Secondary | ICD-10-CM

## 2015-06-21 DIAGNOSIS — G4733 Obstructive sleep apnea (adult) (pediatric): Secondary | ICD-10-CM

## 2015-06-21 DIAGNOSIS — I2510 Atherosclerotic heart disease of native coronary artery without angina pectoris: Secondary | ICD-10-CM

## 2015-06-21 DIAGNOSIS — I471 Supraventricular tachycardia: Secondary | ICD-10-CM | POA: Insufficient documentation

## 2015-06-21 DIAGNOSIS — R002 Palpitations: Secondary | ICD-10-CM

## 2015-06-21 DIAGNOSIS — I4719 Other supraventricular tachycardia: Secondary | ICD-10-CM

## 2015-06-21 DIAGNOSIS — IMO0001 Reserved for inherently not codable concepts without codable children: Secondary | ICD-10-CM

## 2015-06-21 MED ORDER — PROPRANOLOL HCL 20 MG PO TABS: 20.0000 mg | ORAL_TABLET | Freq: Three times a day (TID) | ORAL | Status: DC | PRN

## 2015-06-21 NOTE — Assessment & Plan Note (Signed)
Recommended strict diet, low carbohydrate diet

## 2015-06-21 NOTE — Assessment & Plan Note (Signed)
Tachycardia symptoms at nighttime, possibly exacerbated by sleep apnea Scheduled for repeat sleep study

## 2015-06-21 NOTE — Assessment & Plan Note (Signed)
Atrial tach on holter, He has done better on diltiazem 30 mg 3 times a day. We did offer to change him to Cardizem 120 milligrams daily. He will call us if he would like to make this change. We did give him propranolol 20 mg pills to take as needed for breakthrough tachycardia

## 2015-06-21 NOTE — Assessment & Plan Note (Signed)
We have encouraged continued exercise, careful diet management in an effort to lose weight. 

## 2015-06-21 NOTE — Progress Notes (Signed)
Patient ID: Hector Curry, male    DOB: 1961-07-29, 54 y.o.   MRN: [id number]  HPI Comments: [name] is a pleasant 54 year old male with history of obesity, LAP-BAND, obstructive sleep apnea who wears CPAP, 15 years of smoking who stopped several years ago, strong family history of coronary artery disease with father who passed away from MI in his late 8140s, who presents  For follow-up after recent cardiac catheterization and 30 day monitor.  In early September 2016, reported having tachycardia He went to the emergency room, reported having EKG at that time documenting tachycardia. None is available in the computer  He presented to the hospital with chest pain. Scheduled for cardiac catheterization showing no significant CAD  30 day monitor was ordered showing no significant arrhythmia He does report that he had a episode of tachycardia but he did not hit the button He did show APCs  Prior Holter monitor showed short runs of atrial tachycardia Many of his symptoms are at nighttime. He does have sleep apnea, wears CPAP, scheduled for repeat sleep study. Last was 15 years ago  EKG on today's visit shows normal sinus rhythm with rate 66 bpm, no significant ST or T-wave changes  Other past medical history Prior episodes of chest pain. Symptoms typically presenting at nighttime Symptoms are rare but will wake him up from sleep.  Not been exercising secondary to chronic joint issues. N   No Known Allergies  Current Outpatient Prescriptions on File Prior to Visit  Medication Sig Dispense Refill  . aspirin 81 MG tablet Take 81 mg by mouth daily.    . bisoprolol-hydrochlorothiazide (ZIAC) 10-6.25 MG per tablet Take 1 tablet by mouth daily. 30 tablet 3  . cholecalciferol (VITAMIN D) 1000 UNITS tablet Take 1,000 Units by mouth daily.     Marland Kitchen. diltiazem (CARDIZEM) 30 MG tablet Take 1 tablet (30 mg total) by mouth 3 (three) times daily. 90 tablet 6  . fluticasone (FLONASE) 50 MCG/ACT  nasal spray Place 1 spray into both nostrils daily as needed for allergies or rhinitis.    Marland Kitchen. glucosamine-chondroitin 500-400 MG tablet Take 1 tablet by mouth 2 (two) times daily.     . hydroxypropyl methylcellulose / hypromellose (ISOPTO TEARS / GONIOVISC) 2.5 % ophthalmic solution Place 1 drop into both eyes as needed for dry eyes.    . metFORMIN (GLUCOPHAGE) 850 MG tablet Take 1 tablet (850 mg total) by mouth 2 (two) times daily with a meal. Do not start before 16th of this month 60 tablet 0  . Multiple Vitamin (MULTIVITAMIN WITH MINERALS) TABS tablet Take 1 tablet by mouth daily.    . nitroGLYCERIN (NITROSTAT) 0.4 MG SL tablet Place 1 tablet (0.4 mg total) under the tongue every 5 (five) minutes as needed for chest pain. May take up to 3 tablets 25 tablet 0  . Omega-3 Fatty Acids (FISH OIL) 1000 MG CAPS Take 1,000 mg by mouth 2 (two) times daily.     . potassium chloride 20 MEQ TBCR Take 20 mEq by mouth 2 (two) times daily. 60 tablet 3  . vitamin E 400 UNIT capsule Take 400 Units by mouth daily.     No current facility-administered medications on file prior to visit.    Past Medical History  Diagnosis Date  . Hyperlipidemia   . Hypertension   . Family history of premature CAD     a. father passed from MI at 5949  . Morbid obesity (HCC)     a. s/p LAP-BAND  .  History of tobacco abuse   . Palpitations     a. 48 hr Holter 04/2015: NSR w/ rare PVC, short runs of narrow complex tachycardiac, possible atrial tach, longest run 7 beats, PACs noted (2% of all beats 3600 total) they did not seem to correlate w/ significant arrythmia   . Kidney stones   . Allergic rhinitis   . Diverticulosis 10/03/2014  . GERD (gastroesophageal reflux disease)   . Hemorrhoids     a. internal hemorrhoids s/p surgery 1999  . Hyperplastic colon polyp 10/03/2014    a. x 2   . Inflammatory arthritis (HCC)     a. CCP antibodies & x-rays negative. Rheumatoid factor 14, felt to be crystaline over RA or psoriatic  . OSA  (obstructive sleep apnea)     a. on CPAP  . Coronary artery disease, non-occlusive     a. cath 05/08/2015: no significant CAD, LVEF nl, cont preventative therapy    Past Surgical History  Procedure Laterality Date  . Bariatric surgery      lap band   . Cholecystectomy    . Tonsillectomy    . Hemorrhoid surgery  1999  . Carpal tunnel release Left   . Colonoscopy  06/27/2005  . Colonoscopy  10/03/2014  . Esophagogastroduodenoscopy  06/27/2005  . Cardiac catheterization N/A 05/08/2015    Procedure: Left Heart Cath and Coronary Angiography;  Surgeon: Corky Crafts, MD;  Location: [facility] INVASIVE CV LAB;  Service: Cardiovascular;  Laterality: N/A;    Social History  reports that he quit smoking about 25 years ago. His smoking use included Cigarettes. He has a 15 pack-year smoking history. He has never used smokeless tobacco. He reports that he does not drink alcohol or use illicit drugs.  Family History family history includes CAD in his father; Diabetes in his mother and other; Heart attack (age of onset: 35) in his father; Heart disease in his other; Hypertension in his mother, other, and sister.     Review of Systems  Constitutional: Negative.   Respiratory: Negative.   Cardiovascular: Positive for palpitations.  Gastrointestinal: Negative.   Musculoskeletal: Negative.   Neurological: Negative.   Hematological: Negative.   Psychiatric/Behavioral: Negative.   All other systems reviewed and are negative. BP 120/72 mmHg  Pulse 66  Ht  (1.88 m)  Wt 319 lb 8 oz (144.924 kg)  BMI 41.00 kg/m2  Physical Exam  Constitutional: He is oriented to person, place, and time. He appears well-developed and well-nourished.  HENT:  Head: Normocephalic.  Nose: Nose normal.  Mouth/Throat: Oropharynx is clear and moist.  Eyes: Conjunctivae are normal. Pupils are equal, round, and reactive to light.  Neck: Normal range of motion. Neck supple. No JVD present.  Cardiovascular: Normal rate,  regular rhythm, S1 normal, S2 normal, normal heart sounds and intact distal pulses.  Exam reveals no gallop and no friction rub.   No murmur heard. Pulmonary/Chest: Effort normal and breath sounds normal. No respiratory distress. He has no wheezes. He has no rales. He exhibits no tenderness.  Abdominal: Soft. Bowel sounds are normal. He exhibits no distension. There is no tenderness.  Musculoskeletal: Normal range of motion. He exhibits no edema or tenderness.  Lymphadenopathy:    He has no cervical adenopathy.  Neurological: He is alert and oriented to person, place, and time. Coordination normal.  Skin: Skin is warm and dry. No rash noted. No erythema.  Psychiatric: He has a normal mood and affect. His behavior is normal. Judgment and thought content  normal.      Assessment and Plan   Nursing note and vitals reviewed.

## 2015-06-21 NOTE — Patient Instructions (Addendum)
You are doing well. You are likely having runs of atrial tachycardia  For tachycardia, Take either extra diltiazem 30 mg pill or propranolol one pill  Please call us if you have new issues that need to be addressed before your next appt.  Your physician wants you to follow-up in: 6 months.  You will receive a reminder letter in the mail two months in advance. If you don't receive a letter, please call our office to schedule the follow-up appointment.  Nonspecific Tachycardia Tachycardia is a faster than normal heartbeat (more than 100 beats per minute). In adults, the heart normally beats between 60 and 100 times a minute. A fast heartbeat may be a normal response to exercise or stress. It does not necessarily mean that something is wrong. However, sometimes when your heart beats too fast it may not be able to pump enough blood to the rest of your body. This can result in chest pain, shortness of breath, dizziness, and even fainting. Nonspecific tachycardia means that the specific cause or pattern of your tachycardia is unknown. CAUSES  Tachycardia may be harmless or it may be due to a more serious underlying cause. Possible causes of tachycardia include:  Exercise or exertion.  Fever.  Pain or injury.  Infection.  Loss of body fluids (dehydration).  Overactive thyroid.  Lack of red blood cells (anemia).  Anxiety and stress.  Alcohol.  Caffeine.  Tobacco products.  Diet pills.  Illegal drugs.  Heart disease. SYMPTOMS  Rapid or irregular heartbeat (palpitations).  Suddenly feeling your heart beating (cardiac awareness).  Dizziness.  Tiredness (fatigue).  Shortness of breath.  Chest pain.  Nausea.  Fainting. DIAGNOSIS  Your caregiver will perform a physical exam and take your medical history. In some cases, a heart specialist (cardiologist) may be consulted. Your caregiver may also order:  Blood tests.  Electrocardiography. This test records the  electrical activity of your heart.  A heart monitoring test. TREATMENT  Treatment will depend on the likely cause of your tachycardia. The goal is to treat the underlying cause of your tachycardia. Treatment methods may include:  Replacement of fluids or blood through an intravenous (IV) tube for moderate to severe dehydration or anemia.  New medicines or changes in your current medicines.  Diet and lifestyle changes.  Treatment for certain infections.  Stress relief or relaxation methods. HOME CARE INSTRUCTIONS   Rest.  Drink enough fluids to keep your urine clear or pale yellow.  Do not smoke.  Avoid:  Caffeine.  Tobacco.  Alcohol.  Chocolate.  Stimulants such as over-the-counter diet pills or pills that help you stay awake.  Situations that cause anxiety or stress.  Illegal drugs such as marijuana, phencyclidine (PCP), and cocaine.  Only take medicine as directed by your caregiver.  Keep all follow-up appointments as directed by your caregiver. SEEK IMMEDIATE MEDICAL CARE IF:   You have pain in your chest, upper arms, jaw, or neck.  You become weak, dizzy, or feel faint.  You have palpitations that will not go away.  You vomit, have diarrhea, or pass blood in your stool.  Your skin is cool, pale, and wet.  You have a fever that will not go away with rest, fluids, and medicine. MAKE SURE YOU:   Understand these instructions.  Will watch your condition.  Will get help right away if you are not doing well or get worse.   This information is not intended to replace advice given to you by your health care provider.  Make sure you discuss any questions you have with your health care provider.   Document Released: 09/18/2004 Document Revised: 11/03/2011 Document Reviewed: 02/23/2015 Elsevier Interactive Patient Education [employer]

## 2015-06-21 NOTE — Assessment & Plan Note (Signed)
Chest pain is atypical in nature given cardiac catheterization showing no significant CAD

## 2015-08-01 ENCOUNTER — Other Ambulatory Visit: Payer: Self-pay | Admitting: *Deleted

## 2015-08-01 DIAGNOSIS — R42 Dizziness and giddiness: Secondary | ICD-10-CM

## 2015-08-01 DIAGNOSIS — R079 Chest pain, unspecified: Secondary | ICD-10-CM

## 2015-08-01 DIAGNOSIS — R002 Palpitations: Secondary | ICD-10-CM

## 2015-08-01 MED ORDER — DILTIAZEM HCL 30 MG PO TABS: 30.0000 mg | ORAL_TABLET | Freq: Three times a day (TID) | ORAL | Status: DC

## 2015-08-01 MED ORDER — POTASSIUM CHLORIDE ER 20 MEQ PO TBCR: 20.0000 meq | EXTENDED_RELEASE_TABLET | Freq: Two times a day (BID) | ORAL | Status: DC

## 2015-08-01 MED ORDER — BISOPROLOL-HYDROCHLOROTHIAZIDE 10-6.25 MG PO TABS: 1.0000 | ORAL_TABLET | Freq: Every day | ORAL | Status: DC

## 2015-08-02 ENCOUNTER — Other Ambulatory Visit: Payer: Self-pay

## 2015-08-02 MED ORDER — PROPRANOLOL HCL 20 MG PO TABS: 20.0000 mg | ORAL_TABLET | Freq: Three times a day (TID) | ORAL | Status: DC | PRN

## 2015-08-02 NOTE — Telephone Encounter (Signed)
Refill sent for propranolol 90 day supply sent to [facility].

## 2015-09-20 ENCOUNTER — Other Ambulatory Visit: Payer: Self-pay | Admitting: Cardiovascular Disease

## 2015-09-21 ENCOUNTER — Encounter: Payer: Self-pay | Admitting: *Deleted

## 2015-09-21 ENCOUNTER — Encounter: Payer: 59 | Admitting: Internal Medicine

## 2015-09-21 NOTE — Progress Notes (Signed)
[facility] Pulmonary Medicine Consultation      NO-SHOW FOR O.V.     Assessment and Plan:  Obstructive sleep apnea.  Essential hypertension.  Atrial tachycardia, with palpitations.   Date: 09/21/2015  MRN# [id number] Hector Curry [birth year]  Referring Physician:   LAQUINTON BIHM is a 55 y.o. old male seen in consultation for chief complaint of:   No chief complaint on file.   HPI:   The patient is a 55 year old male with a history of morbid obesity status post lap band, sleep apnea on CPAP, atrial tachycardia with palpitations.   PMHX:   Past Medical History  Diagnosis Date  . Hyperlipidemia   . Hypertension   . Family history of premature CAD     a. father passed from MI at 101  . Morbid obesity (HCC)     a. s/p LAP-BAND  . History of tobacco abuse   . Palpitations     a. 48 hr Holter 04/2015: NSR w/ rare PVC, short runs of narrow complex tachycardiac, possible atrial tach, longest run 7 beats, PACs noted (2% of all beats 3600 total) they did not seem to correlate w/ significant arrythmia   . Kidney stones   . Allergic rhinitis   . Diverticulosis 10/03/2014  . GERD (gastroesophageal reflux disease)   . Hemorrhoids     a. internal hemorrhoids s/p surgery 1999  . Hyperplastic colon polyp 10/03/2014    a. x 2   . Inflammatory arthritis (HCC)     a. CCP antibodies & x-rays negative. Rheumatoid factor 14, felt to be crystaline over RA or psoriatic  . OSA (obstructive sleep apnea)     a. on CPAP  . Coronary artery disease, non-occlusive     a. cath 05/08/2015: no significant CAD, LVEF nl, cont preventative therapy   Surgical Hx:  Past Surgical History  Procedure Laterality Date  . Bariatric surgery      lap band   . Cholecystectomy    . Tonsillectomy    . Hemorrhoid surgery  1999  . Carpal tunnel release Left   . Colonoscopy  06/27/2005  . Colonoscopy  10/03/2014  . Esophagogastroduodenoscopy  06/27/2005  . Cardiac catheterization N/A 05/08/2015   Procedure: Left Heart Cath and Coronary Angiography;  Surgeon: Corky Crafts, MD;  Location: [facility] INVASIVE CV LAB;  Service: Cardiovascular;  Laterality: N/A;   Family Hx:  Family History  Problem Relation Age of Onset  . Heart attack Father 57  . Hypertension Sister   . CAD Father   . Hypertension Mother   . Diabetes Mother   . Diabetes Other   . Hypertension Other   . Heart disease Other    Social Hx:   Social History  Substance Use Topics  . Smoking status: Former Smoker -- 1.00 packs/day for 15 years    Types: Cigarettes    Quit date: 08/05/1989  . Smokeless tobacco: Never Used  . Alcohol Use: No   Medication:   Current Outpatient Rx  Name  Route  Sig  Dispense  Refill  . aspirin 81 MG tablet   Oral   Take 81 mg by mouth daily.         . bisoprolol-hydrochlorothiazide (ZIAC) 10-6.25 MG tablet   Oral   Take 1 tablet by mouth daily.   90 tablet   1   . cholecalciferol (VITAMIN D) 1000 UNITS tablet   Oral   Take 1,000 Units by mouth daily.          Marland Kitchen  diltiazem (CARDIZEM) 30 MG tablet   Oral   Take 1 tablet (30 mg total) by mouth 3 (three) times daily.   270 tablet   1   . fluticasone (FLONASE) 50 MCG/ACT nasal spray   Each Nare   Place 1 spray into both nostrils daily as needed for allergies or rhinitis.         Marland Kitchen glucosamine-chondroitin 500-400 MG tablet   Oral   Take 1 tablet by mouth 2 (two) times daily.          . hydroxypropyl methylcellulose / hypromellose (ISOPTO TEARS / GONIOVISC) 2.5 % ophthalmic solution   Both Eyes   Place 1 drop into both eyes as needed for dry eyes.         . metFORMIN (GLUCOPHAGE) 850 MG tablet   Oral   Take 1 tablet (850 mg total) by mouth 2 (two) times daily with a meal. Do not start before 16th of this month   60 tablet   0   . Multiple Vitamin (MULTIVITAMIN WITH MINERALS) TABS tablet   Oral   Take 1 tablet by mouth daily.         . nitroGLYCERIN (NITROSTAT) 0.4 MG SL tablet   Sublingual   Place 1  tablet (0.4 mg total) under the tongue every 5 (five) minutes as needed for chest pain. May take up to 3 tablets   25 tablet   0   . Omega-3 Fatty Acids (FISH OIL) 1000 MG CAPS   Oral   Take 1,000 mg by mouth 2 (two) times daily.          . Potassium Chloride ER 20 MEQ TBCR   Oral   Take 20 mEq by mouth 2 (two) times daily.   180 tablet   1   . propranolol (INDERAL) 20 MG tablet      Take 1 tablet by mouth 3  times daily as needed   270 tablet   3   . vitamin E 400 UNIT capsule   Oral   Take 400 Units by mouth daily.             Allergies:  Review of patient's allergies indicates no known allergies.  Review of Systems: Gen:  Denies  fever, sweats, chills HEENT: Denies blurred vision, double vision. bleeds, sore throat Cvc:  No dizziness, chest pain. Resp:   Denies cough or sputum porduction, shortness of breath Gi: Denies swallowing difficulty, stomach pain. Gu:  Denies bladder incontinence, burning urine Ext:   No Joint pain, stiffness. Skin: No skin rash,  hives Endoc:  No polyuria, polydipsia. Psych: No depression, insomnia. Other:  All other systems were reviewed with the patient and were negative other that what is mentioned in the HPI.   Physical Examination:   VS: There were no vitals taken for this visit.  General Appearance: No distress  Neuro:without focal findings,  speech normal,  HEENT: PERRLA, EOM intact.   Pulmonary: normal breath sounds, No wheezing.  CardiovascularNormal S1,S2.  No m/r/g.   Abdomen: Benign, Soft, non-tender. Renal:  No costovertebral tenderness  GU:  No performed at this time. Endoc: No evident thyromegaly, no signs of acromegaly. Skin:   warm, no rashes, no ecchymosis  Extremities: normal, no cyanosis, clubbing.  Other findings:    LABORATORY PANEL:   CBC No results for input(s): WBC, HGB, HCT, PLT in the last 168  hours. ------------------------------------------------------------------------------------------------------------------  Chemistries  No results for input(s): NA, K, CL, CO2, GLUCOSE, BUN, CREATININE, CALCIUM,  MG, AST, ALT, ALKPHOS, BILITOT in the last 168 hours.  Invalid input(s): GFRCGP ------------------------------------------------------------------------------------------------------------------  Cardiac Enzymes No results for input(s): TROPONINI in the last 168 hours. ------------------------------------------------------------  RADIOLOGY:  No results found.     Thank  you for the consultation and for allowing [facility] [place] [facility], [facility] to assist in the care of your patient. Our recommendations are noted above.  Please contact us if we can be of further service.   Wells Guiles, MD.  Board Certified in Internal Medicine, Pulmonary Medicine, Critical Care Medicine, and Sleep Medicine.  [place] [facility] Office Number: [phone]  Santiago Glad, M.D.  Stephanie Acre, M.D.  Billy Fischer, M.D  This encounter was created in error - please disregard.

## 2015-09-28 ENCOUNTER — Institutional Professional Consult (permissible substitution): Payer: 59 | Admitting: Internal Medicine

## 2015-11-11 ENCOUNTER — Other Ambulatory Visit: Payer: Self-pay | Admitting: Physician Assistant

## 2015-12-20 ENCOUNTER — Other Ambulatory Visit: Payer: Self-pay | Admitting: Internal Medicine

## 2015-12-20 DIAGNOSIS — R945 Abnormal results of liver function studies: Secondary | ICD-10-CM

## 2015-12-20 DIAGNOSIS — R7989 Other specified abnormal findings of blood chemistry: Secondary | ICD-10-CM

## 2015-12-20 DIAGNOSIS — Z6841 Body Mass Index (BMI) 40.0 and over, adult: Secondary | ICD-10-CM

## 2015-12-24 ENCOUNTER — Encounter: Payer: Self-pay | Admitting: Internal Medicine

## 2015-12-24 ENCOUNTER — Ambulatory Visit (INDEPENDENT_AMBULATORY_CARE_PROVIDER_SITE_OTHER): Payer: 59 | Admitting: Internal Medicine

## 2015-12-24 VITALS — BP 132/74 | HR 82 | Ht 74.0 in | Wt 324.0 lb

## 2015-12-24 DIAGNOSIS — G4733 Obstructive sleep apnea (adult) (pediatric): Secondary | ICD-10-CM

## 2015-12-24 NOTE — Progress Notes (Signed)
[facility]monary Medicine Consultation      Assessment and Plan:  Obstructive sleep apnea -Known severe obstructive sleep apnea, we'll need a titration study, and a new CPAP machine. -Patient is worried about having a sleep study without wearing CPAP, given his tachycardia issues, explained that that his insurance may require him to undergo sleep study as a baseline in order to re- diagnose him with obstructive sleep apnea. -Therefore, today I am ordering a in lab, Pap titration study to determine his ideal pressure. Discussed with him that his new device may not be covered by insurance without a baseline sleep study.  Atrial tachycardia/Atrial fibrillation of uncertain etiology. -May be related to obstructive sleep apnea.  Essential Hypertension.  -We will related to obstructive sleep apnea.  Morbid obesity. -Likely contributory to obstructive sleep apnea.    Date: 12/24/2015  MRN# [id number] Hector Curry 1961/08/06  Referring Physician: Dr. Judithann Sheen.   Hector Curry is a 55 y.o. old male seen in consultation for chief complaint of:    Chief Complaint  Patient presents with  . sleep consult    pt ref by dr sparks. pt currently wears cpap 7hr nightly. unsure if pressure is good. no supplies needed. DME: [employer].    HPI:   Patient is a 55 year old male with a history of a obstructive sleep apnea, currently on CPAP. He was recently seen by his cardiologist to 2 complaints of tachycardia. He underwent a 30 day Holter monitor which showed no significant arrhythmia, but he has been found to have atrial tachycardia in the past. He had noted that much of his symptoms occurred at nighttime. Therefore, there was some concern that his symptoms were due to inadequately treated obstructive sleep apnea. The patient notes that he is currently using CPAP and falls asleep within minutes. He typically goes to bed between 10 and 11 PM. He typically wakes up once during the night and  gets out of bed at 4 AM to start his day. He has a history of obesity and is status post lap band procedure.   He was referred for a repeat sleep study, insurance madated that he is to be tried on a home sleep study but he could not fall asleep without his cpap. He uses his cpap every night, about 7 hours per night. His original sleep study was about 20 years, his currently machine is about 54 years old and does not seem to be blowing adequate pressure and some of the buttons dont work. He is very concerned about tyring to do a sleep study without wearing his PAP.   He is currently on a pressure of 17 cm.     PMHX:   Past Medical History  Diagnosis Date  . Hyperlipidemia   . Hypertension   . Family history of premature CAD     a. father passed from MI at 11  . Morbid obesity (HCC)     a. s/p LAP-BAND  . History of tobacco abuse   . Palpitations     a. 48 hr Holter 04/2015: NSR w/ rare PVC, short runs of narrow complex tachycardiac, possible atrial tach, longest run 7 beats, PACs noted (2% of all beats 3600 total) they did not seem to correlate w/ significant arrythmia   . Kidney stones   . Allergic rhinitis   . Diverticulosis 10/03/2014  . GERD (gastroesophageal reflux disease)   . Hemorrhoids     a. internal hemorrhoids s/p surgery 1999  . Hyperplastic colon polyp  10/03/2014    a. x 2   . Inflammatory arthritis (HCC)     a. CCP antibodies & x-rays negative. Rheumatoid factor 14, felt to be crystaline over RA or psoriatic  . OSA (obstructive sleep apnea)     a. on CPAP  . Coronary artery disease, non-occlusive     a. cath 05/08/2015: no significant CAD, LVEF nl, cont preventative therapy   Surgical Hx:  Past Surgical History  Procedure Laterality Date  . Bariatric surgery      lap band   . Cholecystectomy    . Tonsillectomy    . Hemorrhoid surgery  1999  . Carpal tunnel release Left   . Colonoscopy  06/27/2005  . Colonoscopy  10/03/2014  . Esophagogastroduodenoscopy  06/27/2005    . Cardiac catheterization N/A 05/08/2015    Procedure: Left Heart Cath and Coronary Angiography;  Surgeon: Corky Crafts, MD;  Location: [facility] INVASIVE CV LAB;  Service: Cardiovascular;  Laterality: N/A;   Family Hx:  Family History  Problem Relation Age of Onset  . Heart attack Father 50  . Hypertension Sister   . CAD Father   . Hypertension Mother   . Diabetes Mother   . Diabetes Other   . Hypertension Other   . Heart disease Other    Social Hx:   Social History  Substance Use Topics  . Smoking status: Former Smoker -- 1.00 packs/day for 15 years    Types: Cigarettes    Quit date: 08/05/1989  . Smokeless tobacco: Never Used  . Alcohol Use: No   Medication:   Current Outpatient Rx  Name  Route  Sig  Dispense  Refill  . aspirin 81 MG tablet   Oral   Take 81 mg by mouth daily.         . bisoprolol-hydrochlorothiazide (ZIAC) 10-6.25 MG tablet      Take 1 tablet by mouth  daily   90 tablet   3   . cholecalciferol (VITAMIN D) 1000 UNITS tablet   Oral   Take 1,000 Units by mouth daily.          Marland Kitchen diltiazem (CARDIZEM) 30 MG tablet      Take 1 tablet by mouth 3  times daily   270 tablet   3   . fluticasone (FLONASE) 50 MCG/ACT nasal spray   Each Nare   Place 1 spray into both nostrils daily as needed for allergies or rhinitis.         Marland Kitchen glucosamine-chondroitin 500-400 MG tablet   Oral   Take 1 tablet by mouth 2 (two) times daily.          . hydroxypropyl methylcellulose / hypromellose (ISOPTO TEARS / GONIOVISC) 2.5 % ophthalmic solution   Both Eyes   Place 1 drop into both eyes as needed for dry eyes.         . metFORMIN (GLUCOPHAGE) 850 MG tablet   Oral   Take 1 tablet (850 mg total) by mouth 2 (two) times daily with a meal. Do not start before 16th of this month   60 tablet   0   . Multiple Vitamin (MULTIVITAMIN WITH MINERALS) TABS tablet   Oral   Take 1 tablet by mouth daily.         . nitroGLYCERIN (NITROSTAT) 0.4 MG SL tablet    Sublingual   Place 1 tablet (0.4 mg total) under the tongue every 5 (five) minutes as needed for chest pain. May take up to 3 tablets  25 tablet   0   . Omega-3 Fatty Acids (FISH OIL) 1000 MG CAPS   Oral   Take 1,000 mg by mouth 2 (two) times daily.          . Potassium Chloride ER 20 MEQ TBCR      Take 1 tablet by mouth  twice a day   180 tablet   3   . propranolol (INDERAL) 20 MG tablet      Take 1 tablet by mouth 3  times daily as needed   270 tablet   3   . vitamin E 400 UNIT capsule   Oral   Take 400 Units by mouth daily.             Allergies:  Review of patient's allergies indicates no known allergies.  Review of Systems: Gen:  Denies  fever, sweats, chills HEENT: Denies blurred vision, double vision. bleeds, sore throat Cvc:  No dizziness, chest pain. Resp:   Denies cough or sputum production, shortness of breath Gi: Denies swallowing difficulty, stomach pain. Gu:  Denies bladder incontinence, burning urine Ext:   No Joint pain, stiffness. Skin: No skin rash,  hives  Endoc:  No polyuria, polydipsia. Psych: No depression, insomnia. Other:  All other systems were reviewed with the patient and were negative other that what is mentioned in the HPI.   Physical Examination:   VS: BP 132/74 mmHg  Pulse 82  Ht 6\' 2"  (1.88 m)  Wt 324 lb (146.965 kg)  BMI 41.58 kg/m2  SpO2 92%  General Appearance: No distress  Neuro:without focal findings,  speech normal,  HEENT: PERRLA, EOM intact.  [place] [facility] Pulmonary: normal breath sounds, No wheezing.  CardiovascularNormal S1,S2.  No m/r/g.   Abdomen: Benign, Soft, non-tender. Renal:  No costovertebral tenderness  GU:  No performed at this time. Endoc: No evident thyromegaly, no signs of acromegaly. Skin:   warm, no rashes, no ecchymosis  Extremities: normal, no cyanosis, clubbing.  Other findings:    LABORATORY PANEL:   CBC No results for input(s): WBC, HGB, HCT, PLT in the last 168  hours. ------------------------------------------------------------------------------------------------------------------  Chemistries  No results for input(s): NA, K, CL, CO2, GLUCOSE, BUN, CREATININE, CALCIUM, MG, AST, ALT, ALKPHOS, BILITOT in the last 168 hours.  Invalid input(s): GFRCGP ------------------------------------------------------------------------------------------------------------------  Cardiac Enzymes No results for input(s): TROPONINI in the last 168 hours. ------------------------------------------------------------  RADIOLOGY:  No results found.     Thank  you for the consultation and for allowing [facility] to assist in the care of your patient. Our recommendations are noted above.  Please contact us if we can be of further service.   Wells Guileseep Dniyah Grant, MD.  Board Certified in Internal Medicine, Pulmonary Medicine, Critical Care Medicine, and Sleep Medicine.  [place] [facility] Office Number: [phone]  Santiago Gladavid Kasa, M.D.  Stephanie AcreVishal Mungal, M.D.  Billy Fischeravid Simonds, M.D  12/24/2015

## 2015-12-24 NOTE — Patient Instructions (Addendum)
--  Will send you for an in-lab CPAP titration study.   --Will order a new PAP device.

## 2015-12-27 ENCOUNTER — Other Ambulatory Visit: Payer: Self-pay

## 2015-12-27 DIAGNOSIS — G4733 Obstructive sleep apnea (adult) (pediatric): Secondary | ICD-10-CM

## 2016-01-02 ENCOUNTER — Ambulatory Visit
Admission: RE | Admit: 2016-01-02 | Discharge: 2016-01-02 | Disposition: A | Discharge status: Home / Self Care | Payer: 59 | Source: Ambulatory Visit | Attending: Internal Medicine | Admitting: Internal Medicine

## 2016-01-02 DIAGNOSIS — K760 Fatty (change of) liver, not elsewhere classified: Secondary | ICD-10-CM | POA: Insufficient documentation

## 2016-01-02 DIAGNOSIS — R161 Splenomegaly, not elsewhere classified: Secondary | ICD-10-CM | POA: Insufficient documentation

## 2016-01-02 DIAGNOSIS — R7989 Other specified abnormal findings of blood chemistry: Secondary | ICD-10-CM | POA: Diagnosis present

## 2016-01-02 DIAGNOSIS — R945 Abnormal results of liver function studies: Secondary | ICD-10-CM

## 2016-02-04 ENCOUNTER — Encounter: Payer: Self-pay | Admitting: Internal Medicine

## 2016-02-13 ENCOUNTER — Ambulatory Visit (INDEPENDENT_AMBULATORY_CARE_PROVIDER_SITE_OTHER): Payer: 59 | Admitting: Cardiovascular Disease

## 2016-02-13 ENCOUNTER — Encounter: Payer: Self-pay | Admitting: Cardiovascular Disease

## 2016-02-13 VITALS — BP 120/84 | HR 77 | Ht 74.0 in | Wt 326.5 lb

## 2016-02-13 DIAGNOSIS — R945 Abnormal results of liver function studies: Secondary | ICD-10-CM

## 2016-02-13 DIAGNOSIS — I471 Supraventricular tachycardia: Secondary | ICD-10-CM

## 2016-02-13 DIAGNOSIS — I2510 Atherosclerotic heart disease of native coronary artery without angina pectoris: Secondary | ICD-10-CM | POA: Diagnosis not present

## 2016-02-13 DIAGNOSIS — E1165 Type 2 diabetes mellitus with hyperglycemia: Secondary | ICD-10-CM

## 2016-02-13 DIAGNOSIS — R7989 Other specified abnormal findings of blood chemistry: Secondary | ICD-10-CM | POA: Insufficient documentation

## 2016-02-13 DIAGNOSIS — IMO0001 Reserved for inherently not codable concepts without codable children: Secondary | ICD-10-CM

## 2016-02-13 DIAGNOSIS — E781 Pure hyperglyceridemia: Secondary | ICD-10-CM

## 2016-02-13 NOTE — Patient Instructions (Signed)
You are doing well. No medication changes were made.  Please call us if you have new issues that need to be addressed before your next appt.  Your physician wants you to follow-up in: 12 months.  You will receive a reminder letter in the mail two months in advance. If you don't receive a letter, please call our office to schedule the follow-up appointment. 

## 2016-02-13 NOTE — Progress Notes (Signed)
Patient ID: Hector BolognaGary K Angelica, male   DOB: [birth year], 55 y.o.   MRN: [id number] Cardiology Office Note  Date:  02/13/2016   ID:  Hector Curry, DOB [birth year], MRN [id number]  PCP:  Marguarite ArbourSPARKS,JEFFREY D, MD   Chief Complaint  Patient presents with  . other    6 month f/u. Meds reviewed verbally with pt.    HPI:  [name] is a pleasant 55 year old male with history of obesity, LAP-BAND, obstructive sleep apnea who wears CPAP, 15 years of smoking who stopped several years ago, strong family history of coronary artery disease with father who passed away from MI in his late 4740s, who presents  For follow-up Of his atrial tachycardia Previous presentation to the hospital for chest pain,cardiac catheterization showing no significant coronary disease  In follow-up today, reports that one month ago had tightness in his left chest with associated tingling in his fingers, neck discomfort, symptoms lasted for 3 days and then resolved He took extra aspirin  no further episodes since that time In general has well-controlled tachycardia, not needing extra propranolol Comfortable with his diltiazem 30 mg pills  EKG on today's visit shows normal sinus rhythm with rate 77 bpm, no significant ST or T-wave changes  Other past medical history In early September 2016, reported having tachycardia He went to the emergency room, reported having EKG at that time documenting tachycardia. None is available in the computer  30 day monitor was ordered showing no significant arrhythmia He does report that he had a episode of tachycardia but he did not hit the button He did show APCs  Prior Holter monitor showed short runs of atrial tachycardia Many of his symptoms are at nighttime. He does have sleep apnea, wears CPAP, scheduled for repeat sleep study. Last was 15 years ago  Prior episodes of chest pain. Symptoms typically presenting at nighttime Symptoms are rare but will wake him up from sleep.   Not been exercising secondary to chronic joint issues  PMH:   has a past medical history of Hyperlipidemia; Hypertension; Family history of premature CAD; Morbid obesity (HCC); History of tobacco abuse; Palpitations; Kidney stones; Allergic rhinitis; Diverticulosis (10/03/2014); GERD (gastroesophageal reflux disease); Hemorrhoids; Hyperplastic colon polyp (10/03/2014); Inflammatory arthritis (HCC); OSA (obstructive sleep apnea); and Coronary artery disease, non-occlusive.  PSH:    Past Surgical History  Procedure Laterality Date  . Bariatric surgery      lap band   . Cholecystectomy    . Tonsillectomy    . Hemorrhoid surgery  1999  . Carpal tunnel release Left   . Colonoscopy  06/27/2005  . Colonoscopy  10/03/2014  . Esophagogastroduodenoscopy  06/27/2005  . Cardiac catheterization N/A 05/08/2015    Procedure: Left Heart Cath and Coronary Angiography;  Surgeon: Corky CraftsJayadeep S Varanasi, MD;  [facility];  Service: Cardiovascular;  Laterality: N/A;    Current Outpatient Prescriptions  Medication Sig Dispense Refill  . aspirin 81 MG tablet Take 81 mg by mouth daily.    . bisoprolol-hydrochlorothiazide (ZIAC) 10-6.25 MG tablet Take 1 tablet by mouth  daily 90 tablet 3  . cholecalciferol (VITAMIN D) 1000 UNITS tablet Take 1,000 Units by mouth daily.     Marland Kitchen. diltiazem (CARDIZEM) 30 MG tablet Take 1 tablet by mouth 3  times daily 270 tablet 3  . fenofibrate (TRICOR) 145 MG tablet Take by mouth.    . fluticasone (FLONASE) 50 MCG/ACT nasal spray Place 1 spray into both nostrils daily as needed for allergies or rhinitis.    .Marland Kitchen  glucosamine-chondroitin 500-400 MG tablet Take 1 tablet by mouth 2 (two) times daily.     . hydroxypropyl methylcellulose / hypromellose (ISOPTO TEARS / GONIOVISC) 2.5 % ophthalmic solution Place 1 drop into both eyes as needed for dry eyes.    . metFORMIN (GLUCOPHAGE) 850 MG tablet Take 1 tablet (850 mg total) by mouth 2 (two) times daily with a meal. Do not start  before 16th of this month 60 tablet 0  . Multiple Vitamin (MULTIVITAMIN WITH MINERALS) TABS tablet Take 1 tablet by mouth daily.    . Potassium Chloride ER 20 MEQ TBCR Take 1 tablet by mouth  twice a day 180 tablet 3  . propranolol (INDERAL) 20 MG tablet Take 1 tablet by mouth 3  times daily as needed 270 tablet 3   No current facility-administered medications for this visit.     Allergies:   Review of patient's allergies indicates no known allergies.   Social History:  The patient  reports that he quit smoking about 26 years ago. His smoking use included Cigarettes. He has a 15 pack-year smoking history. He has never used smokeless tobacco. He reports that he does not drink alcohol or use illicit drugs.   Family History:   family history includes CAD in his father; Diabetes in his mother and other; Heart attack (age of onset: 79) in his father; Heart disease in his other; Hypertension in his mother, other, and sister.    Review of Systems: Review of Systems  Constitutional: Negative.   Respiratory: Negative.   Cardiovascular: Negative.        Previous episode of left-sided chest pain one month ago  Gastrointestinal: Negative.   Musculoskeletal: Negative.   Neurological: Negative.   Psychiatric/Behavioral: Negative.   All other systems reviewed and are negative.    PHYSICAL EXAM: VS:  BP 120/84 mmHg  Pulse 77  Ht  (1.88 m)  Wt 326 lb 8 oz (148.099 kg)  BMI 41.90 kg/m2 , BMI Body mass index is 41.9 kg/(m^2). GEN: Well nourished, well developed, in no acute distress, obese HEENT: normal Neck: no JVD, carotid bruits, or masses Cardiac: RRR; no murmurs, rubs, or gallops,no edema  Respiratory:  clear to auscultation bilaterally, normal work of breathing GI: soft, nontender, nondistended, + BS MS: no deformity or atrophy Skin: warm and dry, no rash Neuro:  Strength and sensation are intact Psych: euthymic mood, full affect    Recent Labs: 05/06/2015: ALT 105*;  Hemoglobin 15.4; Platelets 198 05/08/2015: BUN 8; Creatinine, Ser 0.79; Potassium 4.0; Sodium 137    Lipid Panel No results found for: CHOL, HDL, LDLCALC, TRIG    Wt Readings from Last 3 Encounters:  02/13/16 326 lb 8 oz (148.099 kg)  12/24/15 324 lb (146.965 kg)  06/21/15 319 lb 8 oz (144.924 kg)      ASSESSMENT AND PLAN:  Atrial tachycardia (HCC) - Plan: EKG 12-Lead Recently well controlled symptoms on diltiazem 30 mg 3 times a day Also on low-dose beta blocker,  not taking propranolol  Coronary artery disease, non-occlusive - Plan: EKG 12-Lead Currently with no symptoms of angina. No further workup at this time. Continue current medication regimen.  Uncontrolled type 2 diabetes mellitus without complication, without long-term current use of insulin (HCC) Hemoglobin A1c 6.7  Recommended low carbohydrate diet  Hypertriglyceridemia On fenofibrate   recommended weight loss, low carbohydrates  Elevated LFTs Likely secondary to fatty liver  Disposition:   F/U  6 months   Orders Placed This Encounter  Procedures  .  EKG 12-Lead    Total encounter time more than 15 minutes  Greater than 50% was spent in counseling and coordination of care with the patient   Signed, Dossie Arbour, M.D., Ph.D. 02/13/2016  [facility] [place], [place] [phone]

## 2016-02-27 ENCOUNTER — Other Ambulatory Visit: Payer: Self-pay | Admitting: *Deleted

## 2016-02-27 DIAGNOSIS — G4733 Obstructive sleep apnea (adult) (pediatric): Secondary | ICD-10-CM

## 2016-03-13 ENCOUNTER — Telehealth: Payer: Self-pay | Admitting: Internal Medicine

## 2016-03-13 DIAGNOSIS — G4733 Obstructive sleep apnea (adult) (pediatric): Secondary | ICD-10-CM

## 2016-03-13 NOTE — Telephone Encounter (Signed)
Called and spoke with Marchelle FolksAmanda at [facility] after appeal [facility] for [facility] was [facility].  Marchelle FolksAmanda with [facility] stated that all [facility] was needing was a face to face (which patient was seen on 12/24/15) and a New CPAP Rx, that the notes that she has on the patient does not indicate that he needs a new sleep study.  Spoke with patient and advised of this and advised that we could send in a Rx for a New CPAP to be placed on the Auto Mode since [facility] doesn't have that pt is requiring a new sleep study. Pt is very agreeable with this plan. Pt advised that Dr. Nicholos Johnsamachandran is out of the office and will address when he returns.  Phone note routed to Dr. Nicholos Johnsamachandran to advise if he will ok a referral for Auto CPAP and what pressure range he would like to place the patient on.   Dr. Nicholos Johnsamachandran, please advise.  Rhonda J Cobb

## 2016-03-17 NOTE — Telephone Encounter (Signed)
ok a referral for Auto CPAP 5-15.

## 2016-03-18 NOTE — Telephone Encounter (Signed)
Order placed for auto CPAP. Nothing further needed. 

## 2016-04-29 ENCOUNTER — Telehealth: Payer: Self-pay | Admitting: Internal Medicine

## 2016-04-29 DIAGNOSIS — G4733 Obstructive sleep apnea (adult) (pediatric): Secondary | ICD-10-CM

## 2016-04-29 NOTE — Telephone Encounter (Signed)
Pt's CPAP is set on auto 5-15. Called pt and he states when he puts on he feels like it isn't enough pressure. Please advise on next step.

## 2016-04-29 NOTE — Telephone Encounter (Signed)
Pt states his CPAP is set so low he cannot use it. Please call.

## 2016-04-30 NOTE — Telephone Encounter (Signed)
Placed order for auto setting to be changed to 12-20. LMOM informing pt and to call back with any questions.

## 2016-04-30 NOTE — Telephone Encounter (Signed)
Called and spoke with Hector Curry at [facility] and she states pt received new CPAP on 04/25/16 and that there is not data from the machine. Pt states he tried the new machine the first night for 2 hours and states he can't go to sleep with it. It says that same night after the 2 hours he put his old CPAP back on. He says if he can't get to sleep then the pressure will not increase. Please advise. Thanks.

## 2016-04-30 NOTE — Telephone Encounter (Signed)
It starts out at a low pressure and then goes up as he goes to sleep. Will need to review his download and then I can adjust as needed.

## 2016-04-30 NOTE — Telephone Encounter (Signed)
Change auto-PAP pressure range from 5-20 to 12-20.

## 2016-05-08 ENCOUNTER — Telehealth: Payer: Self-pay | Admitting: Internal Medicine

## 2016-05-08 NOTE — Telephone Encounter (Signed)
Patient wants to know if Dr. Ardyth Manam can change settings on new device.  Please call.

## 2016-05-09 NOTE — Telephone Encounter (Signed)
Routed to lbpu triage

## 2016-05-09 NOTE — Telephone Encounter (Signed)
LMOM for pt to call back to discuss order that was placed on 04/29/16 for auto setting change.

## 2016-05-12 NOTE — Telephone Encounter (Signed)
Spoke with pt and informed of setting changes. Nothing further needed.

## 2016-05-29 ENCOUNTER — Other Ambulatory Visit: Payer: Self-pay

## 2016-05-29 ENCOUNTER — Ambulatory Visit (INDEPENDENT_AMBULATORY_CARE_PROVIDER_SITE_OTHER): Payer: 59

## 2016-05-29 DIAGNOSIS — I340 Nonrheumatic mitral (valve) insufficiency: Secondary | ICD-10-CM

## 2016-05-30 ENCOUNTER — Other Ambulatory Visit: Payer: Self-pay

## 2016-05-30 DIAGNOSIS — Z87442 Personal history of urinary calculi: Secondary | ICD-10-CM | POA: Insufficient documentation

## 2016-06-03 ENCOUNTER — Encounter: Payer: Self-pay | Admitting: Gastroenterology

## 2016-06-03 ENCOUNTER — Ambulatory Visit (INDEPENDENT_AMBULATORY_CARE_PROVIDER_SITE_OTHER): Payer: 59 | Admitting: Gastroenterology

## 2016-06-03 VITALS — BP 128/77 | HR 89 | Temp 98.2°F | Ht 74.0 in | Wt 326.4 lb

## 2016-06-03 DIAGNOSIS — K602 Anal fissure, unspecified: Secondary | ICD-10-CM | POA: Diagnosis not present

## 2016-06-03 NOTE — Patient Instructions (Addendum)
You have been instructed to increase your fiber and take a stool softener daily. If this symptoms do not improve, please contact our office and we will refer you to a surgeon.

## 2016-06-03 NOTE — Progress Notes (Signed)
Gastroenterology Consultation  Referring Provider:     Marguarite Arbour, MD Primary Care Physician:  Marguarite Arbour, MD Primary Gastroenterologist:  Dr. Servando Snare     Reason for Consultation:     Rectal bleeding and pain        HPI:   Hector Curry is a 55 y.o. y/o male referred for consultation & management of Rectal bleeding and rectal pain by Dr. Aram Beecham D, MD.  This patient reports that he has been having rectal bleeding and rectal pain. The patient states that his rectal pain is pretty constant but low level until he has a bowel movement whereupon he has severe rectal pain and bleeding. The patient also reports that his been taking one stool softener day. The patient had a colonoscopy by Dr. Marva Panda in the past with a hyperplastic polyp in the rectum. The patient had tried to get in touch with Dr. Reyes Ivan office on 3 different occasions and never got a call backs we came to see me. 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 There is no report of any unexplained weight loss fevers chills nausea vomiting.  Past Medical History:  Diagnosis Date  . Allergic rhinitis   . Coronary artery disease, non-occlusive    a. cath 05/08/2015: no significant CAD, LVEF nl, cont preventative therapy  . Diverticulosis 10/03/2014  . Family history of premature CAD    a. father passed from MI at 80  . GERD (gastroesophageal reflux disease)   . Hemorrhoids    a. internal hemorrhoids s/p surgery 1999  . History of tobacco abuse   . Hyperlipidemia   . Hyperplastic colon polyp 10/03/2014   a. x 2   . Hypertension   . Inflammatory arthritis    a. CCP antibodies & x-rays negative. Rheumatoid factor 14, felt to be crystaline over RA or psoriatic  . Kidney stones   . Morbid obesity (HCC)    a. s/p LAP-BAND  . OSA (obstructive sleep apnea)    a. on CPAP  . Palpitations    a. 48 hr Holter 04/2015: NSR w/ rare PVC, short runs of narrow complex tachycardiac, possible  atrial tach, longest run 7 beats, PACs noted (2% of all beats 3600 total) they did not seem to correlate w/ significant arrythmia     Past Surgical History:  Procedure Laterality Date  . BARIATRIC SURGERY     lap band   . CARDIAC CATHETERIZATION N/A 05/08/2015   Procedure: Left Heart Cath and Coronary Angiography;  Surgeon: Corky Crafts, MD;  Location: [facility] INVASIVE CV LAB;  Service: Cardiovascular;  Laterality: N/A;  . CARPAL TUNNEL RELEASE Left   . CHOLECYSTECTOMY    . COLONOSCOPY  06/27/2005  . COLONOSCOPY  10/03/2014  . ESOPHAGOGASTRODUODENOSCOPY  06/27/2005  . HEMORRHOID SURGERY  1999  . TONSILLECTOMY      Prior to Admission medications   Medication Sig Start Date End Date Taking? Authorizing Provider  aspirin 81 MG tablet Take 81 mg by mouth daily.   Yes Historical Provider, MD  bisoprolol-hydrochlorothiazide [facility]) 10-6.25 MG tablet Take 1 tablet by mouth  daily 11/12/15  Yes Antonieta Iba, MD  cholecalciferol (VITAMIN D) 1000 UNITS tablet Take 1,000 Units by mouth daily.    Yes Historical Provider, MD  diltiazem (CARDIZEM) 30 MG tablet Take 1 tablet by mouth 3  times daily 11/12/15  Yes Antonieta Iba, MD  fenofibrate (TRICOR) 145 MG tablet Take by mouth. 12/20/15 12/19/16 Yes Historical  Provider, MD  fluticasone (FLONASE) 50 MCG/ACT nasal spray Place 1 spray into both nostrils daily as needed for allergies or rhinitis.   Yes Historical Provider, MD  glucosamine-chondroitin 500-400 MG tablet Take 1 tablet by mouth 2 (two) times daily.    Yes Historical Provider, MD  hydroxypropyl methylcellulose / hypromellose (ISOPTO TEARS / GONIOVISC) 2.5 % ophthalmic solution Place 1 drop into both eyes as needed for dry eyes.   Yes Historical Provider, MD  metFORMIN (GLUCOPHAGE) 850 MG tablet Take 1 tablet (850 mg total) by mouth 2 (two) times daily with a meal. Do not start before 16th of this month 05/11/15  Yes Leroy Sea, MD  Multiple Vitamin (MULTIVITAMIN WITH MINERALS) TABS tablet  Take 1 tablet by mouth daily.   Yes Historical Provider, MD  Potassium Chloride ER 20 MEQ TBCR Take 1 tablet by mouth  twice a day 11/12/15  Yes Antonieta Iba, MD  propranolol (INDERAL) 20 MG tablet Take 1 tablet by mouth 3  times daily as needed 09/20/15  Yes Antonieta Iba, MD  cyanocobalamin ([employer] VITAMIN B12) 2000 MCG tablet Take by mouth.    Historical Provider, MD  mometasone (ELOCON) 0.1 % lotion  08/28/15   Historical Provider, MD  nitroGLYCERIN (NITROSTAT) 0.4 MG SL tablet Place under the tongue.    Historical Provider, MD  omega-3 acid ethyl esters (LOVAZA) 1 g capsule Take by mouth.    Historical Provider, MD  Omega-3 Fatty Acids (FISH OIL PO) Take by mouth.    Historical Provider, MD    Family History  Problem Relation Age of Onset  . Hypertension Mother   . Diabetes Mother   . Heart attack Father 19  . CAD Father   . Hypertension Sister   . Diabetes Other   . Hypertension Other   . Heart disease Other      Social History  Substance Use Topics  . Smoking status: Former Smoker    Packs/day: 1.00    Years: 15.00    Types: Cigarettes    Quit date: 08/05/1989  . Smokeless tobacco: Never Used  . Alcohol use No    Allergies as of 06/03/2016  . (No Known Allergies)    Review of Systems:    All systems reviewed and negative except where noted in HPI.   Physical Exam:  BP 128/77   Pulse 89   Temp 98.2 F (36.8 C) (Oral)   Ht 6\' 2"  (1.88 m)   Wt (!) 326 lb 6.4 oz (148.1 kg)   BMI 41.91 kg/m  No LMP for male patient. Psych:  Alert and cooperative. Normal mood and affect. General:   Alert,  Well-developed, well-nourished, pleasant and cooperative in NAD Head:  Normocephalic and atraumatic. Eyes:  Sclera clear, no icterus.   Conjunctiva pink. Ears:  Normal auditory acuity. Nose:  No deformity, discharge, or lesions. Mouth:  No deformity or lesions,oropharynx pink & moist. Neck:  Supple; no masses or thyromegaly. Lungs:  Respirations even and unlabored.  Clear  throughout to auscultation.   No wheezes, crackles, or rhonchi. No acute distress. Heart:  Regular rate and rhythm; no murmurs, clicks, rubs, or gallops. Abdomen:  Normal bowel sounds.  No bruits.  Soft, non-tender and non-distended without masses, hepatosplenomegaly or hernias noted.  No guarding or rebound tenderness.  Negative Carnett sign.   Rectal:  Anal fissure noted at 7:00 with pain to palpation  Msk:  Symmetrical without gross deformities.  Good, equal movement & strength bilaterally. Pulses:  Normal  pulses noted. Extremities:  No clubbing or edema.  No cyanosis. Neurologic:  Alert and oriented x3;  grossly normal neurologically. Skin:  Intact without significant lesions or rashes.  No jaundice. Lymph Nodes:  No significant cervical adenopathy. Psych:  Alert and cooperative. Normal mood and affect.  Imaging Studies: No results found.  Assessment and Plan:   Lynann BolognaGary K Ingham is a 55 y.o. y/o male who has a anal fissure on rectal exam. The patient has been told that the 2 options for treating this is medical and surgical. The patient has been told to make his stools softer by increasing fiber in his diet and possibly going up on his stool softeners. The patient will try this and if this does not work he may need to be set up with a surgeon for a possible anal myotomy. The patient has been explained the plan and agrees with it.   Note: This dictation was prepared with Dragon dictation along with smaller phrase technology. Any transcriptional errors that result from this process are unintentional.

## 2016-07-04 ENCOUNTER — Other Ambulatory Visit: Payer: Self-pay | Admitting: Cardiovascular Disease

## 2016-11-02 ENCOUNTER — Other Ambulatory Visit: Payer: Self-pay | Admitting: Cardiovascular Disease

## 2016-11-07 ENCOUNTER — Emergency Department: Admission: EM | Admit: 2016-11-07 | Discharge: 2016-11-07 | Discharge status: Against Medical Advice | Payer: 59

## 2016-11-07 NOTE — ED Notes (Signed)
Called for room  No answer in flex lobby

## 2016-11-07 NOTE — ED Notes (Signed)
Called again for treatment room   No answer in lobby

## 2016-11-16 ENCOUNTER — Other Ambulatory Visit: Payer: Self-pay | Admitting: Cardiovascular Disease

## 2017-01-01 ENCOUNTER — Ambulatory Visit: Payer: Self-pay | Admitting: Physician Assistant

## 2017-01-01 ENCOUNTER — Encounter: Payer: Self-pay | Admitting: Physician Assistant

## 2017-01-01 VITALS — BP 152/80 | HR 90 | Temp 98.4°F

## 2017-01-01 DIAGNOSIS — J209 Acute bronchitis, unspecified: Secondary | ICD-10-CM

## 2017-01-01 MED ORDER — AZITHROMYCIN 250 MG PO TABS
ORAL_TABLET | ORAL | 0 refills | Status: DC
Start: 2017-01-01 — End: 2017-04-14

## 2017-01-01 MED ORDER — BENZONATATE 200 MG PO CAPS: 200.0000 mg | ORAL_CAPSULE | Freq: Three times a day (TID) | ORAL | 0 refills | Status: DC | PRN

## 2017-01-01 NOTE — Progress Notes (Signed)
S: C/o cough and congestion for 6 days, chest is sore from coughing, some fever, chills, mucus is green,  cough is rattling and  hacking; keeping pt awake at night;  denies cardiac type chest pain or sob, v/d, abd pain Remainder ros neg  O: vitals wnl, nad, tms clear, throat injected, neck supple no lymph, lungs c t a, cv rrr, neuro intact, cough is dry  A:  Acute bronchitis   P:  rx medication: zpack, tessalon perls;  use otc meds, tylenol or motrin as needed for fever/chills, return if not better in 3 -5 days, return earlier if worsening

## 2017-01-22 ENCOUNTER — Other Ambulatory Visit: Payer: Self-pay | Admitting: Cardiovascular Disease

## 2017-01-26 ENCOUNTER — Other Ambulatory Visit: Payer: Self-pay | Admitting: Cardiovascular Disease

## 2017-01-28 ENCOUNTER — Other Ambulatory Visit: Payer: Self-pay | Admitting: Cardiovascular Disease

## 2017-01-29 ENCOUNTER — Other Ambulatory Visit: Payer: Self-pay

## 2017-01-29 ENCOUNTER — Encounter: Payer: Self-pay | Admitting: Gastroenterology

## 2017-01-29 ENCOUNTER — Ambulatory Visit (INDEPENDENT_AMBULATORY_CARE_PROVIDER_SITE_OTHER): Payer: 59 | Admitting: Gastroenterology

## 2017-01-29 VITALS — BP 141/79 | HR 79 | Temp 98.8°F | Ht 74.0 in | Wt 324.0 lb

## 2017-01-29 DIAGNOSIS — K760 Fatty (change of) liver, not elsewhere classified: Secondary | ICD-10-CM | POA: Diagnosis not present

## 2017-01-29 NOTE — Progress Notes (Signed)
Gastroenterology Consultation  Referring Provider:     Marguarite Arbour, MD Primary Care Physician:  Marguarite Arbour, MD Primary Gastroenterologist:  Dr. Servando Snare     Reason for Consultation:     Fatty liver        HPI:   Hector Curry is a 56 y.o. y/o male referred for consultation & management of Fatty liver by Dr. Judithann Sheen, Duane Lope, MD.  This patient comes in today with a history of fatty liver on ultrasound.  The patient was also noted to have abnormal liver enzymes. The patient's liver enzymes from last month showed him to have an AST of 82 with an ALT of 113.  The patient also has a history of diabetes and hyperlipidemia.  The patient states he is on medication for this.  The patient also suffers from obesity.  There is no report of any jaundice, fevers, chills, nausea or vomiting.  The patient is up-to-date with his colonoscopies and had a colonoscopy by Dr. Lissa Merlin in 2016. The patient also reports that his father died of a heart attack at the age of 74.  Past Medical History:  Diagnosis Date  . Allergic rhinitis   . Coronary artery disease, non-occlusive    a. cath 05/08/2015: no significant CAD, LVEF nl, cont preventative therapy  . Diverticulosis 10/03/2014  . Family history of premature CAD    a. father passed from MI at 23  . GERD (gastroesophageal reflux disease)   . Hemorrhoids    a. internal hemorrhoids s/p surgery 1999  . History of tobacco abuse   . Hyperlipidemia   . Hyperplastic colon polyp 10/03/2014   a. x 2   . Hypertension   . Inflammatory arthritis    a. CCP antibodies & x-rays negative. Rheumatoid factor 14, felt to be crystaline over RA or psoriatic  . Kidney stones   . Morbid obesity (HCC)    a. s/p LAP-BAND  . OSA (obstructive sleep apnea)    a. on CPAP  . Palpitations    a. 48 hr Holter 04/2015: NSR w/ rare PVC, short runs of narrow complex tachycardiac, possible atrial tach, longest run 7 beats, PACs noted (2% of all beats 3600 total) they did not  seem to correlate w/ significant arrythmia     Past Surgical History:  Procedure Laterality Date  . BARIATRIC SURGERY     lap band   . CARDIAC CATHETERIZATION N/A 05/08/2015   Procedure: Left Heart Cath and Coronary Angiography;  Surgeon: Corky Crafts, MD;  Location: [facility] INVASIVE CV LAB;  Service: Cardiovascular;  Laterality: N/A;  . CARPAL TUNNEL RELEASE Left   . CHOLECYSTECTOMY    . COLONOSCOPY  06/27/2005  . COLONOSCOPY  10/03/2014  . ESOPHAGOGASTRODUODENOSCOPY  06/27/2005  . HEMORRHOID SURGERY  1999  . TONSILLECTOMY      Prior to Admission medications   Medication Sig Start Date End Date Taking? Authorizing Provider  aspirin 81 MG tablet Take 81 mg by mouth daily.   Yes [provider]  azithromycin (ZITHROMAX Z-PAK) 250 MG tablet 2 pills today then 1 pill a day for 4 days 01/01/17  Yes Fisher, Roselyn Bering, PA-C  benzonatate (TESSALON) 200 MG capsule Take 1 capsule (200 mg total) by mouth 3 (three) times daily as needed for cough. 01/01/17  Yes Fisher, Roselyn Bering, PA-C  bisoprolol-hydrochlorothiazide (ZIAC) 10-6.25 MG tablet TAKE 1 TABLET BY MOUTH  DAILY 01/28/17  Yes Gollan, Tollie Pizza, MD  cholecalciferol (VITAMIN D) 1000 UNITS tablet  Take 1,000 Units by mouth daily.    Yes [provider]  cyanocobalamin ([employer] VITAMIN B12) 2000 MCG tablet Take by mouth.   Yes [provider]  diltiazem (CARDIZEM) 30 MG tablet TAKE 1 TABLET BY MOUTH 3  TIMES DAILY 01/28/17  Yes Gollan, Tollie Pizzaimothy J, MD  fluticasone (FLONASE) 50 MCG/ACT nasal spray Place 1 spray into both nostrils daily as needed for allergies or rhinitis.   Yes [provider]  glucosamine-chondroitin 500-400 MG tablet Take 1 tablet by mouth 2 (two) times daily.    Yes [provider]  hydroxypropyl methylcellulose / hypromellose (ISOPTO TEARS / GONIOVISC) 2.5 % ophthalmic solution Place 1 drop into both eyes as needed for dry eyes.   Yes [provider]  metFORMIN (GLUCOPHAGE) 850 MG tablet  Take 1 tablet (850 mg total) by mouth 2 (two) times daily with a meal. Do not start before 16th of this month 05/11/15  Yes Leroy SeaSingh, Prashant K, MD  mometasone (ELOCON) 0.1 % lotion  08/28/15  Yes [provider]  Multiple Vitamin (MULTIVITAMIN WITH MINERALS) TABS tablet Take 1 tablet by mouth daily.   Yes [provider]  pioglitazone (ACTOS) 30 MG tablet Take by mouth. 01/09/17 01/09/18 Yes [provider]  Potassium Chloride ER 20 MEQ TBCR TAKE 1 TABLET BY MOUTH  TWICE A DAY 01/28/17  Yes Gollan, Tollie Pizzaimothy J, MD  propranolol (INDERAL) 20 MG tablet TAKE 1 TABLET BY MOUTH 3  TIMES DAILY AS NEEDED 07/04/16  Yes Gollan, Tollie Pizzaimothy J, MD  fenofibrate (TRICOR) 145 MG tablet Take by mouth. 12/20/15 12/19/16  [provider]  fenofibrate (TRICOR) 145 MG tablet  01/09/17   [provider]  nitroGLYCERIN (NITROSTAT) 0.4 MG SL tablet Place under the tongue.    [provider]  omega-3 acid ethyl esters (LOVAZA) 1 g capsule Take by mouth.    [provider]  Omega-3 Fatty Acids (FISH OIL PO) Take by mouth.    [provider]    Family History  Problem Relation Age of Onset  . Hypertension Mother   . Diabetes Mother   . Heart attack Father 1749  . CAD Father   . Hypertension Sister   . Diabetes Other   . Hypertension Other   . Heart disease Other      Social History  Substance Use Topics  . Smoking status: Former Smoker    Packs/day: 1.00    Years: 15.00    Types: Cigarettes    Quit date: 08/05/1989  . Smokeless tobacco: Never Used  . Alcohol use No    Allergies as of 01/29/2017  . (No Known Allergies)    Review of Systems:    All systems reviewed and negative except where noted in HPI.   Physical Exam:  BP (!) 141/79   Pulse 79   Temp 98.8 F (37.1 C) (Oral)   Ht 6\' 2"  (1.88 m)   Wt (!) 324 lb (147 kg)   BMI 41.60 kg/m  No LMP for male patient. Psych:  Alert and cooperative. Normal mood and affect. General:   Alert,   Well-developed, Morbid obesity, well-nourished, pleasant and cooperative in NAD Head:  Normocephalic and atraumatic. Eyes:  Sclera clear, no icterus.   Conjunctiva pink. Ears:  Normal auditory acuity. Nose:  No deformity, discharge, or lesions. Mouth:  No deformity or lesions,oropharynx pink & moist. Neck:  Supple; no masses or thyromegaly. Lungs:  Respirations even and unlabored.  Clear throughout to auscultation.   No wheezes,  crackles, or rhonchi. No acute distress. Heart:  Regular rate and rhythm; no murmurs, clicks, rubs, or gallops. Abdomen:  Normal bowel sounds.  No bruits.  Soft, non-tender and non-distended without masses, hepatosplenomegaly or hernias noted.  No guarding or rebound tenderness.  Negative Carnett sign.   Rectal:  Deferred.  Msk:  Symmetrical without gross deformities.  Good, equal movement & strength bilaterally. Pulses:  Normal pulses noted. Extremities:  No clubbing or edema.  No cyanosis. Neurologic:  Alert and oriented x3;  grossly normal neurologically. Skin:  Intact without significant lesions or rashes.  No jaundice. Lymph Nodes:  No significant cervical adenopathy. Psych:  Alert and cooperative. Normal mood and affect.  Imaging Studies: No results found.  Assessment and Plan:   DAXTER PAULE is a 56 y.o. y/o male with abnormal liver enzymes and an ultrasound showing fatty liver.  The patient has been told to try and lose 15% of his body weight.  The patient has also been told to keep his cholesterol and diabetes under control.  He will have his labs sent off for other possible causes of his increased liver enzymes.  The patient will be notified with these results.  The patient has been explained the plan and agrees with it.  Midge Minium, MD. Clementeen Graham   Note: This dictation was prepared with Dragon dictation along with smaller phrase technology. Any transcriptional errors that result from this process are unintentional.

## 2017-01-31 LAB — IRON AND TIBC
Iron Saturation: 15 % (ref 15–55)
Iron: 57 ug/dL (ref 38–169)
Total Iron Binding Capacity: 387 ug/dL (ref 250–450)
UIBC: 330 ug/dL (ref 111–343)

## 2017-01-31 LAB — IGG: IGG (IMMUNOGLOBIN G), SERUM: 977 mg/dL (ref 700–1600)

## 2017-01-31 LAB — HEPATITIS B SURFACE ANTIBODY,QUALITATIVE: HEP B SURFACE AB, QUAL: NONREACTIVE

## 2017-01-31 LAB — CERULOPLASMIN: CERULOPLASMIN: 22.9 mg/dL (ref 16.0–31.0)

## 2017-01-31 LAB — ALPHA-1-ANTITRYPSIN: A1 ANTITRYPSIN: 150 mg/dL (ref 90–200)

## 2017-01-31 LAB — HEPATITIS B SURFACE ANTIGEN: HEP B S AG: NEGATIVE

## 2017-01-31 LAB — ANTI-SMOOTH MUSCLE ANTIBODY, IGG: SMOOTH MUSCLE AB: 37 U — AB (ref 0–19)

## 2017-01-31 LAB — ANA: Anti Nuclear Antibody(ANA): NEGATIVE

## 2017-01-31 LAB — FERRITIN: Ferritin: 82 ng/mL (ref 30–400)

## 2017-01-31 LAB — HEPATITIS C ANTIBODY

## 2017-01-31 LAB — MITOCHONDRIAL ANTIBODIES: MITOCHONDRIAL AB: 6.4 U (ref 0.0–20.0)

## 2017-02-10 ENCOUNTER — Ambulatory Visit: Payer: 59 | Admitting: Cardiovascular Disease

## 2017-03-12 NOTE — Progress Notes (Deleted)
Patient ID: Hector BolognaGary K Gossard, male   DOB: [birth year], 56 y.o.   MRN: [id number] Cardiology Office Note  Date:  03/12/2017   ID:  Hector BolognaGary K Welling, DOB [birth year], MRN [id number]  PCP:  Marguarite ArbourSparks, Jeffrey D, MD   No chief complaint on file.   HPI:  [name] is a pleasant 56 year old male with history of  obesity, LAP-BAND,  obstructive sleep apnea who wears CPAP,  15 years of smoking who stopped several years ago,  Previous presentation to the hospital for chest pain,cardiac catheterization showing no significant coronary disease who presents  For follow-up Of his atrial tachycardia   In follow-up today, reports that one month ago had tightness in his left chest with associated tingling in his fingers, neck discomfort, symptoms lasted for 3 days and then resolved He took extra aspirin  no further episodes since that time In general has well-controlled tachycardia, not needing extra propranolol Comfortable with his diltiazem 30 mg pills  EKG on today's visit shows normal sinus rhythm with rate 77 bpm, no significant ST or T-wave changes  Other past medical history In early September 2016, reported having tachycardia He went to the emergency room, reported having EKG at that time documenting tachycardia. None is available in the computer  30 day monitor was ordered showing no significant arrhythmia He does report that he had a episode of tachycardia but he did not hit the button He did show APCs  Prior Holter monitor showed short runs of atrial tachycardia Many of his symptoms are at nighttime. He does have sleep apnea, wears CPAP, scheduled for repeat sleep study. Last was 15 years ago  Prior episodes of chest pain. Symptoms typically presenting at nighttime Symptoms are rare but will wake him up from sleep.  Not been exercising secondary to chronic joint issues  PMH:   has a past medical history of Allergic rhinitis; Coronary artery disease, non-occlusive;  Diverticulosis (10/03/2014); Family history of premature CAD; GERD (gastroesophageal reflux disease); Hemorrhoids; History of tobacco abuse; Hyperlipidemia; Hyperplastic colon polyp (10/03/2014); Hypertension; Inflammatory arthritis; Kidney stones; Morbid obesity (HCC); OSA (obstructive sleep apnea); and Palpitations.  PSH:    Past Surgical History:  Procedure Laterality Date  . BARIATRIC SURGERY     lap band   . CARDIAC CATHETERIZATION N/A 05/08/2015   Procedure: Left Heart Cath and Coronary Angiography;  Surgeon: Hector CraftsJayadeep S Varanasi, MD;  [facility];  Service: Cardiovascular;  Laterality: N/A;  . CARPAL TUNNEL RELEASE Left   . CHOLECYSTECTOMY    . COLONOSCOPY  06/27/2005  . COLONOSCOPY  10/03/2014  . ESOPHAGOGASTRODUODENOSCOPY  06/27/2005  . HEMORRHOID SURGERY  1999  . TONSILLECTOMY      Current Outpatient Prescriptions  Medication Sig Dispense Refill  . aspirin 81 MG tablet Take 81 mg by mouth daily.    Marland Kitchen. azithromycin (ZITHROMAX Z-PAK) 250 MG tablet 2 pills today then 1 pill a day for 4 days 6 each 0  . benzonatate (TESSALON) 200 MG capsule Take 1 capsule (200 mg total) by mouth 3 (three) times daily as needed for cough. 30 capsule 0  . bisoprolol-hydrochlorothiazide (ZIAC) 10-6.25 MG tablet TAKE 1 TABLET BY MOUTH  DAILY 90 tablet 0  . cholecalciferol (VITAMIN D) 1000 UNITS tablet Take 1,000 Units by mouth daily.     . cyanocobalamin (CVS VITAMIN B12) 2000 MCG tablet Take by mouth.    . diltiazem (CARDIZEM) 30 MG tablet TAKE 1 TABLET BY MOUTH 3  TIMES DAILY 270 tablet 0  . fenofibrate (  TRICOR) 145 MG tablet Take by mouth.    . fenofibrate (TRICOR) 145 MG tablet     . fluticasone (FLONASE) 50 MCG/ACT nasal spray Place 1 spray into both nostrils daily as needed for allergies or rhinitis.    Marland Kitchen glucosamine-chondroitin 500-400 MG tablet Take 1 tablet by mouth 2 (two) times daily.     . hydroxypropyl methylcellulose / hypromellose (ISOPTO TEARS / GONIOVISC) 2.5 % ophthalmic  solution Place 1 drop into both eyes as needed for dry eyes.    . metFORMIN (GLUCOPHAGE) 850 MG tablet Take 1 tablet (850 mg total) by mouth 2 (two) times daily with a meal. Do not start before 16th of this month 60 tablet 0  . mometasone (ELOCON) 0.1 % lotion     . Multiple Vitamin (MULTIVITAMIN WITH MINERALS) TABS tablet Take 1 tablet by mouth daily.    . nitroGLYCERIN (NITROSTAT) 0.4 MG SL tablet Place under the tongue.    Marland Kitchen omega-3 acid ethyl esters (LOVAZA) 1 g capsule Take by mouth.    . Omega-3 Fatty Acids (FISH OIL PO) Take by mouth.    . pioglitazone (ACTOS) 30 MG tablet Take by mouth.    . Potassium Chloride ER 20 MEQ TBCR TAKE 1 TABLET BY MOUTH  TWICE A DAY 180 tablet 0  . propranolol (INDERAL) 20 MG tablet TAKE 1 TABLET BY MOUTH 3  TIMES DAILY AS NEEDED 270 tablet 3   No current facility-administered medications for this visit.      Allergies:   Patient has no known allergies.   Social History:  The patient  reports that he quit smoking about 27 years ago. His smoking use included Cigarettes. He has a 15.00 pack-year smoking history. He has never used smokeless tobacco. He reports that he does not drink alcohol or use drugs.   Family History:   family history includes CAD in his father; Diabetes in his mother and other; Heart attack (age of onset: 93) in his father; Heart disease in his other; Hypertension in his mother, other, and sister.    Review of Systems: Review of Systems  Constitutional: Negative.   Respiratory: Negative.   Cardiovascular: Negative.        Previous episode of left-sided chest pain one month ago  Gastrointestinal: Negative.   Musculoskeletal: Negative.   Neurological: Negative.   Psychiatric/Behavioral: Negative.   All other systems reviewed and are negative.    PHYSICAL EXAM: VS:  There were no vitals taken for this visit. , BMI There is no height or weight on file to calculate BMI. GEN: Well nourished, well developed, in no acute distress,  obese HEENT: normal Neck: no JVD, carotid bruits, or masses Cardiac: RRR; no murmurs, rubs, or gallops,no edema  Respiratory:  clear to auscultation bilaterally, normal work of breathing GI: soft, nontender, nondistended, + BS MS: no deformity or atrophy Skin: warm and dry, no rash Neuro:  Strength and sensation are intact Psych: euthymic mood, full affect    Recent Labs: No results found for requested labs within last 8760 hours.    Lipid Panel No results found for: CHOL, HDL, LDLCALC, TRIG    Wt Readings from Last 3 Encounters:  01/29/17 (!) 324 lb (147 kg)  06/03/16 (!) 326 lb 6.4 oz (148.1 kg)  02/13/16 (!) 326 lb 8 oz (148.1 kg)      ASSESSMENT AND PLAN:  Atrial tachycardia (HCC) - Plan: EKG 12-Lead Recently well controlled symptoms on diltiazem 30 mg 3 times a day Also on low-dose  beta blocker,  not taking propranolol  Coronary artery disease, non-occlusive - Plan: EKG 12-Lead Currently with no symptoms of angina. No further workup at this time. Continue current medication regimen.  Uncontrolled type 2 diabetes mellitus without complication, without long-term current use of insulin (HCC) Hemoglobin A1c 6.7  Recommended low carbohydrate diet  Hypertriglyceridemia On fenofibrate   recommended weight loss, low carbohydrates  Elevated LFTs Likely secondary to fatty liver  Disposition:   F/U  6 months   No orders of the defined types were placed in this encounter.   Total encounter time more than 15 minutes  Greater than 50% was spent in counseling and coordination of care with the patient   Signed, Dossie Arbour, M.D., Ph.D. 03/12/2017  [facility] [place], [place] [phone]

## 2017-03-13 ENCOUNTER — Ambulatory Visit: Payer: 59 | Admitting: Cardiovascular Disease

## 2017-04-13 NOTE — Progress Notes (Signed)
Patient ID: Hector Curry, male   DOB: 02/08/1961, 56 y.o.   MRN: [id number] Cardiology Office Note  Date:  04/14/2017   ID:  Hector Curry, DOB 1961/01/12, MRN [id number]  PCP:  Marguarite Arbour, MD   Chief Complaint  Patient presents with  . other    12 month follow up. Meds reviewed by the pt. verbally. "doing well."     HPI:  [name] is a pleasant 56 year old male with history of  obesity, LAP-BAND,  obstructive sleep apnea who wears BIPAP,  15 years of smoking who stopped several years ago,  strong family history of coronary artery disease with father who passed away from MI in his late 90s,  Previous presentation to the hospital for chest pain,cardiac catheterization 04/2015 showing no significant coronary disease who presents For follow-up Of his atrial tachycardia  In follow-up today he continues to take diltiazem 30 mg pills Rare breakthrough tachycardia at nighttime, sometimes takes propranolol No leg swelling, no significant shortness of breath Overall feels well, weight continues to run high, no exercise program  Previous labs reviewed with him from May 2018, hemoglobin A1c 8.8 Total cholesterol 180  EKG on today's visit shows normal sinus rhythm with rate 74 bpm, no significant ST or T-wave changes  Other past medical history In early September 2016, reported having tachycardia He went to the emergency room, reported having EKG at that time documenting tachycardia. None is available in the computer  30 day monitor was ordered showing no significant arrhythmia He does report that he had a episode of tachycardia but he did not hit the button He did show APCs  Prior Holter monitor showed short runs of atrial tachycardia Many of his symptoms are at nighttime. He does have sleep apnea, wears CPAP, scheduled for repeat sleep study. Last was 15 years ago  Prior episodes of chest pain. Symptoms typically presenting at nighttime Symptoms are rare but  will wake him up from sleep.  Not been exercising secondary to chronic joint issues  PMH:   has a past medical history of Allergic rhinitis; Coronary artery disease, non-occlusive; Diverticulosis (10/03/2014); Family history of premature CAD; GERD (gastroesophageal reflux disease); Hemorrhoids; History of tobacco abuse; Hyperlipidemia; Hyperplastic colon polyp (10/03/2014); Hypertension; Inflammatory arthritis; Kidney stones; Morbid obesity (HCC); OSA (obstructive sleep apnea); and Palpitations.  PSH:    Past Surgical History:  Procedure Laterality Date  . BARIATRIC SURGERY     lap band   . CARDIAC CATHETERIZATION N/A 05/08/2015   Procedure: Left Heart Cath and Coronary Angiography;  Surgeon: Corky Crafts, MD;  Location: [facility] INVASIVE CV LAB;  Service: Cardiovascular;  Laterality: N/A;  . CARPAL TUNNEL RELEASE Left   . CHOLECYSTECTOMY    . COLONOSCOPY  06/27/2005  . COLONOSCOPY  10/03/2014  . ESOPHAGOGASTRODUODENOSCOPY  06/27/2005  . HEMORRHOID SURGERY  1999  . TONSILLECTOMY      Current Outpatient Prescriptions  Medication Sig Dispense Refill  . aspirin 81 MG tablet Take 81 mg by mouth daily.    . benzonatate (TESSALON) 200 MG capsule Take 1 capsule (200 mg total) by mouth 3 (three) times daily as needed for cough. 30 capsule 0  . bisoprolol-hydrochlorothiazide (ZIAC) 10-6.25 MG tablet TAKE 1 TABLET BY MOUTH  DAILY 90 tablet 0  . cholecalciferol (VITAMIN D) 1000 UNITS tablet Take 1,000 Units by mouth daily.     . cyanocobalamin ([employer]S VITAMIN B12) 2000 MCG tablet Take by mouth.    . diltiazem (CARDIZEM) 30 MG tablet TAKE 1  TABLET BY MOUTH 3  TIMES DAILY 270 tablet 0  . fenofibrate (TRICOR) 145 MG tablet     . fluticasone (FLONASE) 50 MCG/ACT nasal spray Place 1 spray into both nostrils daily as needed for allergies or rhinitis.    Marland Kitchen glucosamine-chondroitin 500-400 MG tablet Take 1 tablet by mouth 2 (two) times daily.     . hydroxypropyl methylcellulose / hypromellose (ISOPTO TEARS /  GONIOVISC) 2.5 % ophthalmic solution Place 1 drop into both eyes as needed for dry eyes.    . metFORMIN (GLUCOPHAGE) 850 MG tablet Take 1 tablet (850 mg total) by mouth 2 (two) times daily with a meal. Do not start before 16th of this month 60 tablet 0  . mometasone (ELOCON) 0.1 % lotion     . Multiple Vitamin (MULTIVITAMIN WITH MINERALS) TABS tablet Take 1 tablet by mouth daily.    . nitroGLYCERIN (NITROSTAT) 0.4 MG SL tablet Place under the tongue.    Marland Kitchen omega-3 acid ethyl esters (LOVAZA) 1 g capsule Take by mouth.    . Omega-3 Fatty Acids (FISH OIL PO) Take by mouth.    . pioglitazone (ACTOS) 30 MG tablet Take by mouth.    . Potassium Chloride ER 20 MEQ TBCR TAKE 1 TABLET BY MOUTH  TWICE A DAY 180 tablet 0  . propranolol (INDERAL) 20 MG tablet TAKE 1 TABLET BY MOUTH 3  TIMES DAILY AS NEEDED 270 tablet 3   No current facility-administered medications for this visit.      Allergies:   Patient has no known allergies.   Social History:  The patient  reports that he quit smoking about 27 years ago. His smoking use included Cigarettes. He has a 15.00 pack-year smoking history. He has never used smokeless tobacco. He reports that he does not drink alcohol or use drugs.   Family History:   family history includes CAD in his father; Diabetes in his mother and other; Heart attack (age of onset: 42) in his father; Heart disease in his other; Hypertension in his mother, other, and sister.    Review of Systems: Review of Systems  Constitutional: Negative.   Respiratory: Negative.   Cardiovascular: Negative.   Gastrointestinal: Negative.   Musculoskeletal: Negative.   Neurological: Negative.   Psychiatric/Behavioral: Negative.   All other systems reviewed and are negative.    PHYSICAL EXAM: VS:  BP 130/60 (BP Location: Left Arm, Patient Position: Sitting, Cuff Size: Large)   Pulse 74   Ht 6\' 2"  (1.88 m)   Wt (!) 319 lb 4 oz (144.8 kg)   BMI 40.99 kg/m  , BMI Body mass index is 40.99  kg/m. GEN: Well nourished, well developed, in no acute distress, obese  HEENT: normal  Neck: no JVD, carotid bruits, or masses Cardiac: RRR; no murmurs, rubs, or gallops,no edema  Respiratory:  clear to auscultation bilaterally, normal work of breathing GI: soft, nontender, nondistended, + BS MS: no deformity or atrophy  Skin: warm and dry, no rash Neuro:  Strength and sensation are intact Psych: euthymic mood, full affect    Recent Labs: No results found for requested labs within last 8760 hours.    Lipid Panel No results found for: CHOL, HDL, LDLCALC, TRIG    Wt Readings from Last 3 Encounters:  04/14/17 (!) 319 lb 4 oz (144.8 kg)  01/29/17 (!) 324 lb (147 kg)  06/03/16 (!) 326 lb 6.4 oz (148.1 kg)      ASSESSMENT AND PLAN:  Atrial tachycardia (HCC) - Plan: EKG 12-Lead  Recommended he change the short acting diltiazem to the 120 mg dose extended release Use the short acting diltiazem and propranolol for breakthrough palpitations  Coronary artery disease, non-occlusive - Plan: EKG 12-Lead Currently with no symptoms of angina. No further workup at this time. Continue current medication regimen. Previous cardiac catheterization 2016 with no significant coronary disease  Uncontrolled type 2 diabetes mellitus without complication, without long-term current use of insulin (HCC) Hemoglobin A1c 8.8 This is up from previous numbers of 6.7  Recommended low carbohydrate diet.decreased suite tea and  soda  Hypertriglyceridemia On fenofibrate   recommended weight loss, low carbohydrates  Elevated LFTs Likely secondary to fatty liver  Disposition:   F/U  12 months   Orders Placed This Encounter  Procedures  . EKG 12-Lead    Total encounter time more than 15 minutes  Greater than 50% was spent in counseling and coordination of care with the patient   Signed, Dossie Arbour, M.D., Ph.D. 04/14/2017  [facility] [place], [place] [phone]

## 2017-04-14 ENCOUNTER — Encounter: Payer: Self-pay | Admitting: Cardiovascular Disease

## 2017-04-14 ENCOUNTER — Other Ambulatory Visit: Payer: Self-pay | Admitting: Cardiovascular Disease

## 2017-04-14 ENCOUNTER — Ambulatory Visit (INDEPENDENT_AMBULATORY_CARE_PROVIDER_SITE_OTHER): Payer: 59 | Admitting: Cardiovascular Disease

## 2017-04-14 VITALS — BP 130/60 | HR 74 | Ht 74.0 in | Wt 319.2 lb

## 2017-04-14 DIAGNOSIS — R0602 Shortness of breath: Secondary | ICD-10-CM

## 2017-04-14 DIAGNOSIS — I2510 Atherosclerotic heart disease of native coronary artery without angina pectoris: Secondary | ICD-10-CM

## 2017-04-14 DIAGNOSIS — I471 Supraventricular tachycardia: Secondary | ICD-10-CM | POA: Diagnosis not present

## 2017-04-14 DIAGNOSIS — G4733 Obstructive sleep apnea (adult) (pediatric): Secondary | ICD-10-CM

## 2017-04-14 DIAGNOSIS — Z87891 Personal history of nicotine dependence: Secondary | ICD-10-CM

## 2017-04-14 DIAGNOSIS — I4719 Other supraventricular tachycardia: Secondary | ICD-10-CM

## 2017-04-14 DIAGNOSIS — IMO0001 Reserved for inherently not codable concepts without codable children: Secondary | ICD-10-CM

## 2017-04-14 DIAGNOSIS — E782 Mixed hyperlipidemia: Secondary | ICD-10-CM | POA: Diagnosis not present

## 2017-04-14 DIAGNOSIS — E1165 Type 2 diabetes mellitus with hyperglycemia: Secondary | ICD-10-CM

## 2017-04-14 MED ORDER — DILTIAZEM HCL ER COATED BEADS 120 MG PO CP24: 120.0000 mg | ORAL_CAPSULE | Freq: Every day | ORAL | 3 refills | Status: DC

## 2017-04-14 NOTE — Patient Instructions (Addendum)
Medication Instructions:   Please try the cardizem/diltiazem 120 mg once a daily instead of the diltiazem 30 mg pills  Labwork:  No new labs needed  Testing/Procedures:  No further testing at this time   Follow-Up: It was a pleasure seeing you in the office today. Please call us if you have new issues that need to be addressed before your next appt.  [phone]  Your physician wants you to follow-up in: 12 months.  You will receive a reminder letter in the mail two months in advance. If you don't receive a letter, please call our office to schedule the follow-up appointment.  If you need a refill on your cardiac medications before your next appointment, please call your pharmacy.

## 2017-04-18 ENCOUNTER — Other Ambulatory Visit: Payer: Self-pay | Admitting: Cardiovascular Disease

## 2017-04-20 NOTE — Telephone Encounter (Signed)
Pt to take on prn basis in addition to diltiazem 120 mg

## 2017-04-20 NOTE — Telephone Encounter (Signed)
Per past office visit:  Medication Instructions:   Please try the cardizem/diltiazem 120 mg once a daily instead of the diltiazem 30 mg pills  Is this okay to be refilled.

## 2017-05-13 ENCOUNTER — Ambulatory Visit: Payer: Self-pay | Admitting: Physician Assistant

## 2017-05-13 ENCOUNTER — Encounter: Payer: Self-pay | Admitting: Physician Assistant

## 2017-05-13 VITALS — BP 120/80 | HR 70 | Temp 98.4°F

## 2017-05-13 DIAGNOSIS — M545 Low back pain, unspecified: Secondary | ICD-10-CM

## 2017-05-13 MED ORDER — PREDNISONE 10 MG (48) PO TBPK: ORAL_TABLET | ORAL | 0 refills | Status: DC

## 2017-05-13 NOTE — Progress Notes (Signed)
S:  C/o low back pain for several days, no known injury, pain is worse with movement, increased with bending over, denies numbness, tingling, or changes in bowel/urinary habits, hx of same several times over the past 20 years, usually gets well with steroids, Using otc meds without relief Remainder ros neg  O:  Vitals wnl, nad, lungs c t a, cv rrr, spine nontender, muscles in lower back spasmed , decreased rom with bending forward,  Neg slr, pt walks without difficulty, no foot drop noted, n/v intact  A: acute back pain, muscle spasms  P: use wet heat followed by ice, stretches, return to clinic if not better in 3 t 5 days, return earlier if worsening, rx meds:sterapred ds  12d dose pack

## 2017-06-02 ENCOUNTER — Other Ambulatory Visit: Payer: Self-pay | Admitting: Cardiovascular Disease

## 2017-06-23 ENCOUNTER — Other Ambulatory Visit: Payer: Self-pay | Admitting: Cardiovascular Disease

## 2017-06-24 ENCOUNTER — Other Ambulatory Visit: Payer: Self-pay | Admitting: Cardiovascular Disease

## 2017-07-20 ENCOUNTER — Other Ambulatory Visit: Payer: Self-pay

## 2017-07-20 ENCOUNTER — Telehealth: Payer: Self-pay | Admitting: Cardiovascular Disease

## 2017-07-20 MED ORDER — DILTIAZEM HCL ER COATED BEADS 120 MG PO CP24: 120.0000 mg | ORAL_CAPSULE | Freq: Every day | ORAL | 0 refills | Status: DC

## 2017-07-20 MED ORDER — DILTIAZEM HCL ER COATED BEADS 120 MG PO CP24: 120.0000 mg | ORAL_CAPSULE | Freq: Every day | ORAL | 3 refills | Status: DC

## 2017-07-20 NOTE — Telephone Encounter (Signed)
Requested Prescriptions   Signed Prescriptions Disp Refills  . diltiazem (CARDIZEM CD) 120 MG 24 hr capsule 14 capsule 0    Sig: Take 1 capsule (120 mg total) by mouth daily.    Authorizing Provider: GOLLAN, TIMOTHY J    Ordering User: Fatimata Talsma N   Sent in 2 weeks with to [facility] and a 90 day supply to [facility] per patients request.   Requested Prescriptions   Signed Prescriptions Disp Refills  . diltiazem (CARDIZEM CD) 120 MG 24 hr capsule 90 capsule 3    Sig: Take 1 capsule (120 mg total) by mouth daily.    Authorizing Provider: GOLLAN, TIMOTHY J    Ordering User: Brunette Lavalle N    

## 2017-07-20 NOTE — Telephone Encounter (Signed)
°*  STAT* If patient is at the pharmacy, call can be transferred to refill team.   1. Which medications need to be refilled? (please list name of each medication and dose if known)  Diltiazem   2. Which pharmacy/location (including street and city if local pharmacy) is medication to be sent to? 2 weeks worth to [facility] on [address] And rest to [facility]  3. Do they need a 30 day or 90 day supply?

## 2017-07-20 NOTE — Telephone Encounter (Signed)
Requested Prescriptions   Signed Prescriptions Disp Refills  . diltiazem (CARDIZEM CD) 120 MG 24 hr capsule 14 capsule 0    Sig: Take 1 capsule (120 mg total) by mouth daily.    Authorizing Provider: Antonieta IbaGOLLAN, TIMOTHY J    Ordering User: Margrett RudSLAYTON, Chinonso Linker N   [facility] patients request.   Requested Prescriptions   Signed Prescriptions Disp Refills  . diltiazem (CARDIZEM CD) 120 MG 24 hr capsule 90 capsule 3    Sig: Take 1 capsule (120 mg total) by mouth daily.    Authorizing Provider: Antonieta IbaGOLLAN, TIMOTHY J    Ordering User: Margrett RudSLAYTON, Milanna Kozlov N

## 2017-07-22 ENCOUNTER — Telehealth: Payer: Self-pay | Admitting: Cardiovascular Disease

## 2017-07-22 NOTE — Telephone Encounter (Signed)
Pharmacy calling needing to know the dose change in Cardizem  She states we sent in 120 mg a day  Before this we had sent it in 30 mg 3 times a day immediate release   Would like a call back to confirm   Ref: [id number]

## 2017-07-22 NOTE — Telephone Encounter (Signed)
Spoke with Cordelia PenSherry  from [facility] regarding refill on Cardizem 120. Per last office visit with Dr. Mariah MillingGollan on 04/14/2017 he increased Cardizem 30 to Cardizem 120. Cordelia PenSherry said she would release the medication to the patient and that she was just calling to verify the dose.

## 2017-07-27 ENCOUNTER — Telehealth: Payer: Self-pay | Admitting: Cardiovascular Disease

## 2017-07-27 MED ORDER — DILTIAZEM HCL 30 MG PO TABS: 30.0000 mg | ORAL_TABLET | Freq: Three times a day (TID) | ORAL | 0 refills | Status: DC

## 2017-07-27 NOTE — Telephone Encounter (Signed)
Patient states that he has been on the new diltiazem for about 6-8 days and he has since then started to have frequent episodes that he has not had in over 3 years. The episodes he describes as needles, flushed, dizziness, palpitations, and just not feeling well. He wonders if this is due to previous stomach surgery and maybe he is just not absorbing the medication as well. Offered to send in 30 day dose of Diltiazem to local pharmacy and he was agreeable with this. Advised that I would make Dr. Mariah MillingGollan aware and once he reviews I can send in script to mail order if he is agreeable with switching back. He was very appreciative for the call and had no further questions at this time.

## 2017-07-27 NOTE — Telephone Encounter (Signed)
We can go back to previous dosing 3 times a day Hold the extended release diltiazem

## 2017-07-27 NOTE — Telephone Encounter (Signed)
Pt c/o medication issue:  1. Name of Medication: Diltiazem   2. How are you currently taking this medication (dosage and times per day)?  10 am 120 mg, was taking 3 a day now taking the one   3. Are you having a reaction (difficulty breathing--STAT)? No   4. What is your medication issue? Having a lot of spells, This pill is supposed to help with that.  He would like to go back to the 3 a day instead of the one a day   Please advise.

## 2017-07-28 ENCOUNTER — Telehealth: Payer: Self-pay | Admitting: Cardiovascular Disease

## 2017-07-28 ENCOUNTER — Emergency Department
Admission: EM | Admit: 2017-07-28 | Discharge: 2017-07-28 | Disposition: A | Discharge status: Home / Self Care | Payer: 59 | Attending: Emergency Medicine | Admitting: Emergency Medicine

## 2017-07-28 ENCOUNTER — Emergency Department: Payer: 59

## 2017-07-28 ENCOUNTER — Encounter: Payer: Self-pay | Admitting: Emergency Medicine

## 2017-07-28 DIAGNOSIS — I10 Essential (primary) hypertension: Secondary | ICD-10-CM | POA: Insufficient documentation

## 2017-07-28 DIAGNOSIS — Z79899 Other long term (current) drug therapy: Secondary | ICD-10-CM | POA: Insufficient documentation

## 2017-07-28 DIAGNOSIS — R079 Chest pain, unspecified: Secondary | ICD-10-CM | POA: Insufficient documentation

## 2017-07-28 DIAGNOSIS — Z7984 Long term (current) use of oral hypoglycemic drugs: Secondary | ICD-10-CM | POA: Diagnosis not present

## 2017-07-28 DIAGNOSIS — Z7982 Long term (current) use of aspirin: Secondary | ICD-10-CM | POA: Diagnosis not present

## 2017-07-28 DIAGNOSIS — Z87891 Personal history of nicotine dependence: Secondary | ICD-10-CM | POA: Diagnosis not present

## 2017-07-28 DIAGNOSIS — I2510 Atherosclerotic heart disease of native coronary artery without angina pectoris: Secondary | ICD-10-CM | POA: Diagnosis not present

## 2017-07-28 DIAGNOSIS — E119 Type 2 diabetes mellitus without complications: Secondary | ICD-10-CM | POA: Diagnosis not present

## 2017-07-28 LAB — BASIC METABOLIC PANEL
Anion gap: 11 (ref 5–15)
BUN: 15 mg/dL (ref 6–20)
CALCIUM: 9.8 mg/dL (ref 8.9–10.3)
CO2: 24 mmol/L (ref 22–32)
CREATININE: 0.81 mg/dL (ref 0.61–1.24)
Chloride: 101 mmol/L (ref 101–111)
Glucose, Bld: 142 mg/dL — ABNORMAL HIGH (ref 65–99)
Potassium: 3.8 mmol/L (ref 3.5–5.1)
SODIUM: 136 mmol/L (ref 135–145)

## 2017-07-28 LAB — CBC
HCT: 43.9 % (ref 40.0–52.0)
Hemoglobin: 14.8 g/dL (ref 13.0–18.0)
MCH: 29.1 pg (ref 26.0–34.0)
MCHC: 33.8 g/dL (ref 32.0–36.0)
MCV: 86.2 fL (ref 80.0–100.0)
PLATELETS: 215 10*3/uL (ref 150–440)
RBC: 5.09 MIL/uL (ref 4.40–5.90)
RDW: 14.6 % — AB (ref 11.5–14.5)
WBC: 9.6 10*3/uL (ref 3.8–10.6)

## 2017-07-28 LAB — TROPONIN I

## 2017-07-28 MED ORDER — DILTIAZEM HCL 30 MG PO TABS: 30.0000 mg | ORAL_TABLET | Freq: Three times a day (TID) | ORAL | 3 refills | Status: DC

## 2017-07-28 NOTE — ED Triage Notes (Signed)
Patient presents to the ED with left upper chest pain that began yesterday and has been intermittent.  Patient states pain seems worse with walking/exertion.  Patient states, "It feels like hot needles are being poked into my chest."  Patient denies any radiation of pain.  Patient denies nausea.  Reports when pain is very sharp he becomes dizzy and diaphoretic.

## 2017-07-28 NOTE — Discharge Instructions (Signed)
You are evaluated for chest discomfort, and although no certain cause was found, your exam and evaluation are overall reassuring in the emergency department today.  As we discussed, please follow-up with Dr. Mariah MillingGollan as scheduled for tomorrow.  Return to the emergency department immediately for any new or worsening condition including new or worsening chest pain, nausea, sweats, dizziness or passing out, fever, pain with breathing, or any other symptoms concerning to you.

## 2017-07-28 NOTE — ED Provider Notes (Signed)
[facility] Emergency Department Provider Note ____________________________________________   I have reviewed the triage vital signs and the triage nursing note.  HISTORY  Chief Complaint Chest Pain   Historian Patient  HPI Hector Curry is a 56 y.o. male who is overweight with hyperlipidemia, GERD, nonocclusive coronary artery disease, history of cardiac arrhythmia, follows with cardiologist Dr. Mariah MillingGollan, history of stress test over a year ago, and cardiac cath in 2016, presents with 3 days of intermittent sharp pin-like sensation over the left anterior chest wall.  No known traumatic or overuse injury.  Patient states that it has been coming and going over the past 3 days, possibly related to exertion but not really arm or trunk movement per se.  No shortness of breath, no pleuritic chest pain.  No fevers.  No coughing.  No sinus congestion.  No leg edema or leg swelling.  The patient works here at the hospital in IT.  He has an appointment scheduled for tomorrow with Dr. Mariah MillingGollan, but because the symptoms persisting today, he decided to check in and be checked.  Currently essentially asymptomatic.  Denies burping and belching.  Denies being a Chiropractor"worrier" or any new or unusual stressors.   Past Medical History:  Diagnosis Date  . Allergic rhinitis   . Coronary artery disease, non-occlusive    a. cath 05/08/2015: no significant CAD, LVEF nl, cont preventative therapy  . Diverticulosis 10/03/2014  . Family history of premature CAD    a. father passed from MI at 7549  . GERD (gastroesophageal reflux disease)   . Hemorrhoids    a. internal hemorrhoids s/p surgery 1999  . History of tobacco abuse   . Hyperlipidemia   . Hyperplastic colon polyp 10/03/2014   a. x 2   . Hypertension   . Inflammatory arthritis    a. CCP antibodies & x-rays negative. Rheumatoid factor 14, felt to be crystaline over RA or psoriatic  . Kidney stones   . Morbid obesity (HCC)    a. s/p  LAP-BAND  . OSA (obstructive sleep apnea)    a. on CPAP  . Palpitations    a. 48 hr Holter 04/2015: NSR w/ rare PVC, short runs of narrow complex tachycardiac, possible atrial tach, longest run 7 beats, PACs noted (2% of all beats 3600 total) they did not seem to correlate w/ significant arrythmia     Patient Active Problem List   Diagnosis Date Noted  . History of kidney stones 05/30/2016  . Elevated LFTs 02/13/2016  . Morbid obesity with BMI of 40.0-44.9, adult (HCC) 12/20/2015  . Atrial tachycardia (HCC) 06/21/2015  . Hypertriglyceridemia 05/15/2015  . Coronary artery disease, non-occlusive   . Type II diabetes mellitus, uncontrolled (HCC) 05/08/2015  . Chest pain at rest   . Hyperglycemia 05/07/2015  . Chest pain 05/06/2015  . GERD (gastroesophageal reflux disease)   . Kidney stones   . Hemorrhoids   . Inflammatory arthritis   . Family history of premature CAD   . OSA (obstructive sleep apnea)   . History of tobacco abuse   . Palpitations   . 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/20/2014  . Diverticulosis 10/03/2014  . Hyperplastic colon polyp 10/03/2014  . HTN, goal below 140/90 08/07/2014  . Pain in the chest 06/19/2014  . SOB (shortness of breath) 06/19/2014  . Morbid obesity (HCC) 06/19/2014  . Hyperlipidemia 06/19/2014    Past Surgical History:  Procedure Laterality Date  . BARIATRIC SURGERY     lap band   .  CARDIAC CATHETERIZATION N/A 05/08/2015   Procedure: Left Heart Cath and Coronary Angiography;  Surgeon: Corky Crafts, MD;  Location: [facility] INVASIVE CV LAB;  Service: Cardiovascular;  Laterality: N/A;  . CARPAL TUNNEL RELEASE Left   . CHOLECYSTECTOMY    . COLONOSCOPY  06/27/2005  . COLONOSCOPY  10/03/2014  . ESOPHAGOGASTRODUODENOSCOPY  06/27/2005  . HEMORRHOID SURGERY  1999  . TONSILLECTOMY      Prior to Admission medications   Medication Sig Start Date End Date Taking? Authorizing Provider  aspirin 81 MG tablet Take 81 mg by mouth daily.   Yes [provider]  bisoprolol-hydrochlorothiazide (ZIAC) 10-6.25 MG tablet TAKE 1 TABLET BY MOUTH  DAILY 06/24/17  Yes Gollan, Tollie Pizza, MD  cholecalciferol (VITAMIN D) 1000 UNITS tablet Take 1,000 Units by mouth daily.    Yes [provider]  cyanocobalamin ([employer] VITAMIN B12) 2000 MCG tablet Take 2,000 mcg by mouth daily.    Yes [provider]  diltiazem (CARDIZEM) 30 MG tablet Take 1 tablet (30 mg total) by mouth 3 (three) times daily. 07/28/17  Yes Antonieta Iba, MD  fenofibrate (TRICOR) 145 MG tablet Take 145 mg by mouth daily.  01/09/17  Yes [provider]  fluticasone (FLONASE) 50 MCG/ACT nasal spray Place 1 spray into both nostrils daily as needed for allergies or rhinitis.   Yes [provider]  glucosamine-chondroitin 500-400 MG tablet Take 1 tablet by mouth daily.    Yes [provider]  metFORMIN (GLUCOPHAGE) 850 MG tablet Take 1 tablet (850 mg total) by mouth 2 (two) times daily with a meal. Do not start before 16th of this month 05/11/15  Yes Leroy Sea, MD  Multiple Vitamin (MULTIVITAMIN WITH MINERALS) TABS tablet Take 1 tablet by mouth daily.   Yes [provider]  Potassium Chloride ER 20 MEQ TBCR TAKE 1 TABLET BY MOUTH  TWICE A DAY 06/25/17  Yes Gollan, Tollie Pizza, MD  benzonatate (TESSALON) 200 MG capsule Take 1 capsule (200 mg total) by mouth 3 (three) times daily as needed for cough. Patient not taking: Reported on 05/13/2017 01/01/17   Faythe Ghee, PA-C  hydroxypropyl methylcellulose / hypromellose (ISOPTO TEARS / GONIOVISC) 2.5 % ophthalmic solution Place 1 drop into both eyes as needed for dry eyes.    [provider]  nitroGLYCERIN (NITROSTAT) 0.4 MG SL tablet Place under the tongue.    [provider]  predniSONE (STERAPRED UNI-PAK 48 TAB) 10 MG (48) TBPK tablet Use as directed Patient not taking: Reported on 07/28/2017 05/13/17   Sherrie Mustache Roselyn Bering, PA-C  propranolol (INDERAL) 20 MG tablet TAKE 1  TABLET BY MOUTH 3  TIMES DAILY AS NEEDED 06/02/17   Antonieta Iba, MD    No Known Allergies  Family History  Problem Relation Age of Onset  . Hypertension Mother   . Diabetes Mother   . Heart attack Father 55  . CAD Father   . Hypertension Sister   . Diabetes Other   . Hypertension Other   . Heart disease Other     Social History Social History   Tobacco Use  . Smoking status: Former Smoker    Packs/day: 1.00    Years: 15.00    Pack years: 15.00    Types: Cigarettes    Last attempt to quit: 08/05/1989    Years since quitting: 27.9  . Smokeless tobacco: Never Used  Substance Use Topics  . Alcohol use: No  . Drug use: No  Review of Systems  Constitutional: Negative for fever. Eyes: Negative for visual changes. ENT: Negative for sore throat. Cardiovascular: Pins and needles intermittent chest discomfort left anterior chest as per HPI. Respiratory: Negative for shortness of breath. Gastrointestinal: Negative for abdominal pain, vomiting and diarrhea. Genitourinary: Negative for dysuria. Musculoskeletal: Negative for back pain. Skin: Negative for rash. Neurological: Negative for headache.  Reports some mild lightheadedness/sweats with the episodes of sharp chest discomfort over the left anterior chest wall.  ____________________________________________   PHYSICAL EXAM:  VITAL SIGNS: ED Triage Vitals  Enc Vitals Group     BP 07/28/17 1145 (!) 160/85     Pulse Rate 07/28/17 1145 78     Resp 07/28/17 1145 20     Temp 07/28/17 1145 98.9 F (37.2 C)     Temp Source 07/28/17 1145 Oral     SpO2 07/28/17 1145 96 %     Weight 07/28/17 1145 300 lb (136.1 kg)     Height 07/28/17 1145 6\' 2"  (1.88 m)     Head Circumference --      Peak Flow --      Pain Score 07/28/17 1144 1     Pain Loc --      Pain Edu? --      Excl. in GC? --      Constitutional: Alert and oriented. Well appearing and in no distress. HEENT   Head: Normocephalic and atraumatic.       Eyes: Conjunctivae are normal. Pupils equal and round.       Ears:         Nose: No congestion/rhinnorhea.   Mouth/Throat: Mucous membranes are moist.   Neck: No stridor. Cardiovascular/Chest: Normal rate, regular rhythm.  No murmurs, rubs, or gallops.  Nontender chest wall to anterior and lateral compression. Respiratory: Normal respiratory effort without tachypnea nor retractions. Breath sounds are clear and equal bilaterally. No wheezes/rales/rhonchi. Gastrointestinal: Obese.  Soft. No distention, no guarding, no rebound. Nontender.    Genitourinary/rectal:Deferred Musculoskeletal: Nontender with normal range of motion in all extremities. No joint effusions.  No lower extremity tenderness.  No edema. Neurologic:  Normal speech and language. No gross or focal neurologic deficits are appreciated. Skin:  Skin is warm, dry and intact. No rash noted. Psychiatric: Mood and affect are normal. Speech and behavior are normal. Patient exhibits appropriate insight and judgment.   ____________________________________________  LABS (pertinent positives/negatives) I, Governor Rooks, MD the attending physician have reviewed the labs noted below.  Labs Reviewed  BASIC METABOLIC PANEL - Abnormal; Notable for the following components:      Result Value   Glucose, Bld 142 (*)    All other components within normal limits  CBC - Abnormal; Notable for the following components:   RDW 14.6 (*)    All other components within normal limits  TROPONIN I    ____________________________________________    EKG I, Governor Rooks, MD, the attending physician have personally viewed and interpreted all ECGs.  81 bpm.  Normal sinus rhythm.  Narrow QRS.  Normal axis.  Normal ST and T wave.  No evidence of Brugada or Wolff-Parkinson-White. ____________________________________________  RADIOLOGY All Xrays were viewed by me.  Imaging interpreted by Radiologist, and I, Governor Rooks, MD the attending physician  have reviewed the radiologist interpretation noted below.  Chest x-ray 2 view:  FINDINGS: The heart size and mediastinal contours are within normal limits. Both lungs are clear. The visualized skeletal structures are unremarkable.  IMPRESSION: No active cardiopulmonary disease. __________________________________________  PROCEDURES  Procedure(s) performed: None  Critical Care performed: None   ____________________________________________  ED COURSE / ASSESSMENT AND PLAN  Pertinent labs & imaging results that were available during my care of the patient were reviewed by me and considered in my medical decision making (see chart for details).    Patient is overall well-appearing and essentially asymptomatic now.  He is describing atypical chest discomfort, occurring over the past 3 days, possibly associated with some exertion.  He has had a nonobstructive coronary artery visualized on cath about 2 years ago.  Normal sinus rhythm here.  Chest x-ray is reassuring.  Symptoms seem unlikely related to PE.  Symptoms seem unlikely related to GERD.  We discussed that he might be considered for stress test or possibly Holter monitoring, but exam is reassuring today in the emergency department, and he has his cardiologist appointment already set up for tomorrow.  DIFFERENTIAL DIAGNOSIS: Differential diagnosis includes, but is not limited to, ACS, aortic dissection, pulmonary embolism, cardiac tamponade, pneumothorax, pneumonia, pericarditis, myocarditis, GI-related causes including esophagitis/gastritis, and musculoskeletal chest wall pain.    CONSULTATIONS:   None   Patient / Family / Caregiver informed of clinical course, medical decision-making process, and agree with plan.   I discussed return precautions, follow-up instructions, and discharge instructions with patient and/or family.  Discharge Instructions : You are evaluated for chest discomfort, and although no certain  cause was found, your exam and evaluation are overall reassuring in the emergency department today.  As we discussed, please follow-up with Dr. Mariah MillingGollan as scheduled for tomorrow.  Return to the emergency department immediately for any new or worsening condition including new or worsening chest pain, nausea, sweats, dizziness or passing out, fever, pain with breathing, or any other symptoms concerning to you.    ___________________________________________   FINAL CLINICAL IMPRESSION(S) / ED DIAGNOSES   Final diagnoses:  Nonspecific chest pain      ___________________________________________        Note: This dictation was prepared with Dragon dictation. Any transcriptional errors that result from this process are unintentional    Governor RooksLord, Latria Mccarron, MD 07/28/17 1325

## 2017-07-28 NOTE — Telephone Encounter (Signed)
Pt c/o of Chest Pain: STAT if CP now or developed within 24 hours  1. Are you having CP right now? No  2. Are you experiencing any other symptoms (ex. SOB, nausea, vomiting, sweating)? No   3. How long have you been experiencing CP? 2 days  4. Is your CP continuous or coming and going? Comes and goes more so when walking briskly   5. Have you taken Nitroglycerin? No   Patient scheduled for acute visit with Alycia Rossettiyan tomorrow at 230  ?

## 2017-07-28 NOTE — Telephone Encounter (Signed)
Spoke with patient and he reports having some sharp pains to his heart. He states that he has scheduled appointment here in our office tomorrow. Reviewed signs and symptoms to monitor for that would require immediate evaluation in the ED. He verbalized understanding of our conversation, agreement with plan, and had no further questions at this time.

## 2017-07-28 NOTE — Telephone Encounter (Signed)
Reviewed Dr. Ethelene HalGollans recommendations to go back to the 3 times daily dosage and to stop the extended release diltiazem. He verbalized understanding with no further questions at this time. Prescription sent to mail order pharmacy per patient request.

## 2017-07-28 NOTE — ED Notes (Signed)
Pt ambulatory upon discharge. Verbalized understanding of discharge instructions and follow-up care. Pt states he already has appointment scheduled with cardiologist for tomorrow 12/5. VSS. A&O x4. Skin warm and dry.

## 2017-07-28 NOTE — Telephone Encounter (Signed)
Left voicemail message to call back  

## 2017-07-29 ENCOUNTER — Encounter: Payer: Self-pay | Admitting: Physician Assistant

## 2017-07-29 ENCOUNTER — Ambulatory Visit: Payer: 59 | Admitting: Physician Assistant

## 2017-07-29 VITALS — BP 136/74 | HR 75 | Ht 74.0 in | Wt 320.0 lb

## 2017-07-29 DIAGNOSIS — E781 Pure hyperglyceridemia: Secondary | ICD-10-CM

## 2017-07-29 DIAGNOSIS — R079 Chest pain, unspecified: Secondary | ICD-10-CM

## 2017-07-29 DIAGNOSIS — I10 Essential (primary) hypertension: Secondary | ICD-10-CM | POA: Diagnosis not present

## 2017-07-29 DIAGNOSIS — R002 Palpitations: Secondary | ICD-10-CM

## 2017-07-29 DIAGNOSIS — I471 Supraventricular tachycardia: Secondary | ICD-10-CM | POA: Diagnosis not present

## 2017-07-29 DIAGNOSIS — I2510 Atherosclerotic heart disease of native coronary artery without angina pectoris: Secondary | ICD-10-CM | POA: Diagnosis not present

## 2017-07-29 NOTE — Progress Notes (Signed)
Cardiology Office Note Date:  07/29/2017  Patient ID:  Hector Curry 1960-09-22, MRN [id number] PCP:  Marguarite Arbour, MD  Cardiologist:  Dr. Mariah Milling, MD    Chief Complaint: ED follow up  History of Present Illness: Hector Curry is a 56 y.o. male with history of nonobstructive CAD by LHC in 04/2015, atrial tachycardia, morbid obesity s/p LAP-BAND, OSA on BiPAP, prior tobacco abuse x 15 years, strong family history of CAD with his father passing away from an MI in his late 31's, DM2, HLD, and renal stones who presents for ED follow up of chest pain.   Prior LHC in 04/2015 showed no significant CAD, EF 55-65%. 48-hour Holter in 04/2015 showed NSR with rare PVCs, short runs of narrow complex tachycardia concerning for atrial tachycardia with the longest run being 7 beats, occasional PACs (3,600 total, consisting of 2% of all beats). Symptom of chest discomfort did not correlate with any significant arrhtyhmia. Echo in 04/2015 showed EF 60-65%, normal wall motion, normal LV diastolic function, mild MR, mildly dilated left atrium, RV systolic function was normal, PASP normal. Repeat outpatient cardiac monitoring with a 30-day event monitor in 06/2015 showed NSR with rare PACs, no other significant arrhythmia. Most recent echo from 05/2016 showed EF 60-65%, no RWMA, Gr1DD, mildly dilated aortic root and ascending aorta, structurally normal mitral valve, RV systolic function normal, PASP normal. He was most recently seen by Dr. Mariah Milling on 04/14/2017 for follow up of his atrial tachycardia. He was doing well at that time, noting rare breakthrough palpitations that would improve with propranolol.  He presented to the ED on 12/4 with 3 day history of sharp, intermittent left anterior chest pain. EKG not acute. CXR without active cardiopulmonary disease. Troponin negative x 1, WBC 9.6, HGB 14.8, PLT 215, Na 136, K+ 3.8, BUN 15, SCr 0.81, glucose 142. BP mildly elevated at 160 mmHg systolic. He was  discharged from the ED with outpatient follow up.   He comes in today feeling considerably better than he did on 12/3 and 12/4. He was recently changed from short-acting diltiazem to long-acting diltiazem for convenience. Unfortunately, he feels like he did not tolerate this change 2/2 his prior LAP BAND surgery. He reports the long-acting Cardizem was not effective and he noted significant increase in palpitations. This prompted a change back to his short acting diltiazem on 12/4. He has now been back on the short acting diltiazem for ~ 24 hours and has noted significant improvement in the palpitations. He also stopped drinking his morning Coke Zero as of today. He continues to drink 1 decaffeinated coffee daily. He has noted during the past two days he also had sharp, left-sided chest pain that was mostly occurring when he was ambulating. He denies ever feeling a pain similar to this before. Pain is somewhat reproducible to his palpation. There was some associated SOB, flushing, and diaphoresis. Pain would be short-lived, lasting only a couple of minutes and self resolve. Today, he has not had this pain or palpitations.    Past Medical History:  Diagnosis Date  . Allergic rhinitis   . Coronary artery disease, non-occlusive    a. cath 05/08/2015: no significant CAD, LVEF nl, cont preventative therapy  . Diverticulosis 10/03/2014  . Family history of premature CAD    a. father passed from MI at 45  . GERD (gastroesophageal reflux disease)   . Hemorrhoids    a. internal hemorrhoids s/p surgery 1999  . History of tobacco abuse   .  Hyperlipidemia   . Hyperplastic colon polyp 10/03/2014   a. x 2   . Hypertension   . Inflammatory arthritis    a. CCP antibodies & x-rays negative. Rheumatoid factor 14, felt to be crystaline over RA or psoriatic  . Kidney stones   . Morbid obesity (HCC)    a. s/p LAP-BAND  . OSA (obstructive sleep apnea)    a. on CPAP  . Palpitations    a. 48 hr Holter 04/2015: NSR w/  rare PVC, short runs of narrow complex tachycardiac, possible atrial tach, longest run 7 beats, PACs noted (2% of all beats 3600 total) they did not seem to correlate w/ significant arrythmia     Past Surgical History:  Procedure Laterality Date  . BARIATRIC SURGERY     lap band   . CARDIAC CATHETERIZATION N/A 05/08/2015   Procedure: Left Heart Cath and Coronary Angiography;  Surgeon: Corky CraftsJayadeep S Varanasi, MD;  [facility];  Service: Cardiovascular;  Laterality: N/A;  . CARPAL TUNNEL RELEASE Left   . CHOLECYSTECTOMY    . COLONOSCOPY  06/27/2005  . COLONOSCOPY  10/03/2014  . ESOPHAGOGASTRODUODENOSCOPY  06/27/2005  . HEMORRHOID SURGERY  1999  . TONSILLECTOMY      Current Meds  Medication Sig  . aspirin 81 MG tablet Take 81 mg by mouth daily.  . benzonatate (TESSALON) 200 MG capsule Take 1 capsule (200 mg total) by mouth 3 (three) times daily as needed for cough.  . bisoprolol-hydrochlorothiazide (ZIAC) 10-6.25 MG tablet TAKE 1 TABLET BY MOUTH  DAILY  . cholecalciferol (VITAMIN D) 1000 UNITS tablet Take 1,000 Units by mouth daily.   . cyanocobalamin ([employer] VITAMIN B12) 2000 MCG tablet Take 2,000 mcg by mouth daily.   Marland Kitchen. diltiazem (CARDIZEM) 30 MG tablet Take 1 tablet (30 mg total) by mouth 3 (three) times daily.  . fenofibrate (TRICOR) 145 MG tablet Take 145 mg by mouth daily.   . fluticasone (FLONASE) 50 MCG/ACT nasal spray Place 1 spray into both nostrils daily as needed for allergies or rhinitis.  Marland Kitchen. glucosamine-chondroitin 500-400 MG tablet Take 1 tablet by mouth daily.   . hydroxypropyl methylcellulose / hypromellose (ISOPTO TEARS / GONIOVISC) 2.5 % ophthalmic solution Place 1 drop into both eyes as needed for dry eyes.  . metFORMIN (GLUCOPHAGE) 850 MG tablet Take 1 tablet (850 mg total) by mouth 2 (two) times daily with a meal. Do not start before 16th of this month  . Multiple Vitamin (MULTIVITAMIN WITH MINERALS) TABS tablet Take 1 tablet by mouth daily.  . nitroGLYCERIN  (NITROSTAT) 0.4 MG SL tablet Place under the tongue.  Marland Kitchen. Potassium Chloride ER 20 MEQ TBCR TAKE 1 TABLET BY MOUTH  TWICE A DAY  . propranolol (INDERAL) 20 MG tablet TAKE 1 TABLET BY MOUTH 3  TIMES DAILY AS NEEDED    Allergies:   Patient has no known allergies.   Social History:  The patient  reports that he quit smoking about 28 years ago. His smoking use included cigarettes. He has a 15.00 pack-year smoking history. he has never used smokeless tobacco. He reports that he does not drink alcohol or use drugs.   Family History:  The patient's family history includes CAD in his father; Diabetes in his mother and other; Heart attack (age of onset: 5949) in his father; Heart disease in his other; Hypertension in his mother, other, and sister.  ROS:   Review of Systems  Constitutional: Positive for diaphoresis and malaise/fatigue. Negative for chills, fever and weight  loss.  HENT: Negative for congestion.   Eyes: Negative for discharge and redness.  Respiratory: Positive for shortness of breath. Negative for cough, hemoptysis, sputum production and wheezing.   Cardiovascular: Positive for chest pain and palpitations. Negative for orthopnea, claudication, leg swelling and PND.  Gastrointestinal: Negative for abdominal pain, blood in stool, heartburn, melena, nausea and vomiting.  Genitourinary: Negative for hematuria.  Musculoskeletal: Negative for falls and myalgias.  Skin: Negative for rash.  Neurological: Positive for weakness. Negative for dizziness, tingling, tremors, sensory change, speech change, focal weakness and loss of consciousness.  Endo/Heme/Allergies: Does not bruise/bleed easily.  Psychiatric/Behavioral: Negative for substance abuse. The patient is nervous/anxious.   All other systems reviewed and are negative.    PHYSICAL EXAM:  VS:  BP 136/74 (BP Location: Left Arm, Patient Position: Sitting, Cuff Size: Normal)   Pulse 75   Ht 6\' 2"  (1.88 m)   Wt (!) 320 lb (145.2 kg)   BMI  41.09 kg/m  BMI: Body mass index is 41.09 kg/m.  Physical Exam  Constitutional: He is oriented to person, place, and time. He appears well-developed and well-nourished.  HENT:  Head: Normocephalic and atraumatic.  Eyes: Right eye exhibits no discharge. Left eye exhibits no discharge.  Neck: Normal range of motion. No JVD present.  Cardiovascular: Normal rate, regular rhythm, S1 normal, S2 normal and normal heart sounds. Exam reveals no distant heart sounds, no friction rub, no midsystolic click and no opening snap.  No murmur heard. Pulmonary/Chest: Effort normal and breath sounds normal. No respiratory distress. He has no decreased breath sounds. He has no wheezes. He has no rales. He exhibits no tenderness.  Abdominal: Soft. He exhibits no distension. There is no tenderness.  Musculoskeletal: He exhibits no edema.  Neurological: He is alert and oriented to person, place, and time.  Skin: Skin is warm and dry. No cyanosis. Nails show no clubbing.  Psychiatric: He has a normal mood and affect. His speech is normal and behavior is normal. Judgment and thought content normal.     EKG:  Was ordered and interpreted by me today. Shows NSR, 75 bpm, no acute st/t changes   Recent Labs: 07/28/2017: BUN 15; Creatinine, Ser 0.81; Hemoglobin 14.8; Platelets 215; Potassium 3.8; Sodium 136  No results found for requested labs within last 8760 hours.   Estimated Creatinine Clearance: 158.4 mL/min (by C-G formula based on SCr of 0.81 mg/dL).   Wt Readings from Last 3 Encounters:  07/29/17 (!) 320 lb (145.2 kg)  07/28/17 300 lb (136.1 kg)  04/14/17 (!) 319 lb 4 oz (144.8 kg)     Other studies reviewed: Additional studies/records reviewed today include: summarized above  ASSESSMENT AND PLAN:  1. Chest pain with moderate risk for cardiac etiology/nonobstructive CAD: Currently, symptom-free. Schedule treadmill Myoview to evaluate functional status, possible ectopy, and for high-risk ischemia.  Continue ASA. Aggressive risk factor modification.   2. Palpitations/atrial tachycardia: Currently, asymptomatic. He declines cardiac monitoring at this time. Check magnesium with recommendation to replete to goal > 2.0 and check TSH. He will take an extra KCl 20 mEq this evening. Continue short-acting diltiazem 30 mg tid and prn propranolol 20 mg tid.   3. Hypertriglyceridemia: Fenofibrate. Weight loss. Decrease sugars.   4. HTN: Continue current medications. Followed by PCP.  Disposition: F/u with me in 1 month.  Current medicines are reviewed at length with the patient today.  The patient did not have any concerns regarding medicines.  Elinor DodgeSigned, Jaspreet Bodner PA-C 07/29/2017 2:36 PM  [address] [place] [address] [place], [place] [address] [phone]

## 2017-07-29 NOTE — Patient Instructions (Addendum)
Medication Instructions:  Your physician recommends that you continue on your current medications as directed. Please refer to the Current Medication list given to you today.   Labwork: Your physician recommends that you return for lab work in: TODAY (TSH, MG).   Testing/Procedures: Your physician has requested that you have TREADMILL myoview. For further information please visit [email or web address]. Please follow instruction sheet, as given.  [facility] TREADMILL MYOVIEW  Your caregiver has ordered a Stress Test with nuclear imaging. The purpose of this test is to evaluate the blood supply to your heart muscle. This procedure is referred to as a "Non-Invasive Stress Test." This is because other than having an IV started in your vein, nothing is inserted or "invades" your body. Cardiac stress tests are done to find areas of poor blood flow to the heart by determining the extent of coronary artery disease (CAD). Some patients exercise on a treadmill, which naturally increases the blood flow to your heart, while others who are  unable to walk on a treadmill due to physical limitations have a pharmacologic/chemical stress agent called Lexiscan . This medicine will mimic walking on a treadmill by temporarily increasing your coronary blood flow.   Please note: these test may take anywhere between 2-4 hours to complete  PLEASE REPORT TO [facility]  THE VOLUNTEERS AT THE FIRST DESK WILL DIRECT YOU WHERE TO GO  Date of Procedure:________12/10/18 AND 12/11/18_________  Arrival Time for Procedure:_______07:15 AM____________  Instructions regarding medication:   _X_ : Hold diabetes (METFORMIN) medication morning of procedure  _X_:  Hold betablocker(BISOPROLOL/HCTZ) night before procedure and morning of procedure   PLEASE NOTIFY THE OFFICE AT LEAST 24 HOURS IN ADVANCE IF YOU ARE UNABLE TO KEEP YOUR APPOINTMENT.  [phone] AND  PLEASE NOTIFY NUCLEAR MEDICINE AT [facility] AT LEAST 24 HOURS  IN ADVANCE IF YOU ARE UNABLE TO KEEP YOUR APPOINTMENT. [phone]  How to prepare for your Myoview test:  1. Do not eat or drink after midnight 2. No caffeine for 24 hours prior to test 3. No smoking 24 hours prior to test. 4. Your medication may be taken with water.  If your doctor stopped a medication because of this test, do not take that medication. 5. Ladies, please do not wear dresses.  Skirts or pants are appropriate. Please wear a short sleeve shirt. 6. No perfume, cologne or lotion. 7. Wear comfortable walking shoes. No heels!       Follow-Up: Your physician recommends that you schedule a follow-up appointment in: 1 MONTH WITH RYAN.  If you need a refill on your cardiac medications before your next appointment, please call your pharmacy.   Cardiac Nuclear Scan A cardiac nuclear scan is a test that measures blood flow to the heart when a person is resting and when he or she is exercising. The test looks for problems such as:  Not enough blood reaching a portion of the heart.  The heart muscle not working normally.  You may need this test if:  You have heart disease.  You have had abnormal lab results.  You have had heart surgery or angioplasty.  You have chest pain.  You have shortness of breath.  In this test, a radioactive dye (tracer) is injected into your bloodstream. After the tracer has traveled to your heart, an imaging device is used to measure how much of the tracer is absorbed by or distributed to various areas of your heart. This procedure is usually done at a hospital and takes 2-4 hours.  Tell a health care provider about:  Any allergies you have.  All medicines you are taking, including vitamins, herbs, eye drops, creams, and over-the-counter medicines.  Any problems you or family members have had with the use of anesthetic medicines.  Any blood disorders you have.  Any surgeries you have had.  Any medical conditions you have.  Whether you  are pregnant or may be pregnant. What are the risks? Generally, this is a safe procedure. However, problems may occur, including:  Serious chest pain and heart attack. This is only a risk if the stress portion of the test is done.  Rapid heartbeat.  Sensation of warmth in your chest. This usually passes quickly.  What happens before the procedure?  Ask your health care provider about changing or stopping your regular medicines. This is especially important if you are taking diabetes medicines or blood thinners.  Remove your jewelry on the day of the procedure. What happens during the procedure?  An IV tube will be inserted into one of your veins.  Your health care provider will inject a small amount of radioactive tracer through the tube.  You will wait for 20-40 minutes while the tracer travels through your bloodstream.  Your heart activity will be monitored with an electrocardiogram (ECG).  You will lie down on an exam table.  Images of your heart will be taken for about 15-20 minutes.  You may be asked to exercise on a treadmill or stationary bike. While you exercise, your heart's activity will be monitored with an ECG, and your blood pressure will be checked. If you are unable to exercise, you may be given a medicine to increase blood flow to parts of your heart.  When blood flow to your heart has peaked, a tracer will again be injected through the IV tube.  After 20-40 minutes, you will get back on the exam table and have more images taken of your heart.  When the procedure is over, your IV tube will be removed. The procedure may vary among health care providers and hospitals. Depending on the type of tracer used, scans may need to be repeated 3-4 hours later. What happens after the procedure?  Unless your health care provider tells you otherwise, you may return to your normal schedule, including diet, activities, and medicines.  Unless your health care provider tells  you otherwise, you may increase your fluid intake. This will help flush the contrast dye from your body. Drink enough fluid to keep your urine clear or pale yellow.  It is up to you to get your test results. Ask your health care provider, or the department that is doing the test, when your results will be ready. Summary  A cardiac nuclear scan measures the blood flow to the heart when a person is resting and when he or she is exercising.  You may need this test if you are at risk for heart disease.  Tell your health care provider if you are pregnant.  Unless your health care provider tells you otherwise, increase your fluid intake. This will help flush the contrast dye from your body. Drink enough fluid to keep your urine clear or pale yellow. This information is not intended to replace advice given to you by your health care provider. Make sure you discuss any questions you have with your health care provider. Document Released: 09/05/2004 Document Revised: 08/13/2016 Document Reviewed: 07/20/2013 Elsevier Interactive Patient Education  2017 [employer]

## 2017-07-30 ENCOUNTER — Other Ambulatory Visit: Payer: Self-pay

## 2017-07-30 LAB — TSH: TSH: 0.815 u[IU]/mL (ref 0.450–4.500)

## 2017-07-30 LAB — MAGNESIUM: MAGNESIUM: 1.9 mg/dL (ref 1.6–2.3)

## 2017-07-30 MED ORDER — MAGNESIUM OXIDE -MG SUPPLEMENT 400 (240 MG) MG PO TABS
1.0000 | ORAL_TABLET | Freq: Every day | ORAL | 0 refills | Status: DC
Start: 2017-07-30 — End: 2021-02-12

## 2017-08-03 ENCOUNTER — Ambulatory Visit: Payer: 59

## 2017-08-03 ENCOUNTER — Ambulatory Visit: Admission: RE | Admit: 2017-08-03 | Payer: 59 | Source: Ambulatory Visit

## 2017-08-04 ENCOUNTER — Ambulatory Visit: Admission: RE | Admit: 2017-08-04 | Payer: 59 | Source: Ambulatory Visit

## 2017-08-04 ENCOUNTER — Other Ambulatory Visit: Payer: Self-pay

## 2017-08-04 ENCOUNTER — Ambulatory Visit
Admission: RE | Admit: 2017-08-04 | Discharge: 2017-08-04 | Disposition: A | Discharge status: Home / Self Care | Payer: 59 | Source: Ambulatory Visit | Attending: Physician Assistant | Admitting: Physician Assistant

## 2017-08-04 DIAGNOSIS — R002 Palpitations: Secondary | ICD-10-CM | POA: Diagnosis present

## 2017-08-04 DIAGNOSIS — R079 Chest pain, unspecified: Secondary | ICD-10-CM | POA: Diagnosis not present

## 2017-08-04 MED ORDER — TECHNETIUM TC 99M TETROFOSMIN IV KIT
32.1800 | PACK | Freq: Once | INTRAVENOUS | Status: AC | PRN
  Administered 2017-08-04: 32.18 via INTRAVENOUS

## 2017-08-05 ENCOUNTER — Ambulatory Visit
Admission: RE | Admit: 2017-08-05 | Discharge: 2017-08-05 | Disposition: A | Discharge status: Home / Self Care | Payer: 59 | Source: Ambulatory Visit | Attending: Physician Assistant | Admitting: Physician Assistant

## 2017-08-05 LAB — NM MYOCAR MULTI W/SPECT W/WALL MOTION / EF
CHL CUP MPHR: 166 {beats}/min
CSEPEW: 7 METS
CSEPPHR: 142 {beats}/min
Exercise duration (min): 7 min
Exercise duration (sec): 33 s
LV sys vol: 48 mL
LVDIAVOL: 129 mL (ref 62–150)
Percent HR: 85 %
Rest HR: 71 {beats}/min
TID: 0.82

## 2017-08-05 MED ORDER — TECHNETIUM TC 99M TETROFOSMIN IV KIT
31.8850 | PACK | Freq: Once | INTRAVENOUS | Status: AC | PRN
  Administered 2017-08-05: 31.885 via INTRAVENOUS

## 2017-08-06 ENCOUNTER — Telehealth: Payer: Self-pay | Admitting: Cardiovascular Disease

## 2017-08-06 ENCOUNTER — Telehealth: Payer: Self-pay | Admitting: Physician Assistant

## 2017-08-06 DIAGNOSIS — R002 Palpitations: Secondary | ICD-10-CM

## 2017-08-06 DIAGNOSIS — R079 Chest pain, unspecified: Secondary | ICD-10-CM

## 2017-08-06 NOTE — Telephone Encounter (Signed)
See results note. Reviewed results with patient.  

## 2017-08-06 NOTE — Telephone Encounter (Signed)
Pt calling stating he knows the stress test was normal  He states he is worried for he is still having the same issues   Dizzy spells, pains across the chest.   He is worried for he is not coming back for another few weeks  Please advise.  He is having the pains but they are not sharpe. The dizzy spells make him lightheaded and is worrying him.

## 2017-08-06 NOTE — Telephone Encounter (Signed)
Pt would like stress test results. Please call. 

## 2017-08-06 NOTE — Telephone Encounter (Signed)
Spoke with patient and he states that he realizes his stress test was normal but he is still having those episodes of dizziness and chest pain. He is concerned given his next appointment is in a couple of weeks. He said that echo and/or monitor was discussed as well. Advised that I would forward message over to our PA and would be in touch with any further recommendations. He verbalized understanding with no further questions at this time.

## 2017-08-06 NOTE — Telephone Encounter (Signed)
Spoke with patient and reviewed recommendations to have echocardiogram and event monitor. Also discussed how we would push out his follow up appointment as well to allow for us to get all results back prior to coming in. He verbalized understanding of our conversation, agreement with plan, and had no further questions at this time.

## 2017-08-06 NOTE — Telephone Encounter (Signed)
Please schedule patient for echo and 30-day event monitor. Diagnoses of palpitations and chest pain. Follow up should be after these tests are completed and resulted.

## 2017-08-10 ENCOUNTER — Ambulatory Visit: Payer: 59

## 2017-08-19 ENCOUNTER — Other Ambulatory Visit: Payer: Self-pay

## 2017-08-19 ENCOUNTER — Telehealth: Payer: Self-pay | Admitting: Cardiovascular Disease

## 2017-08-19 MED ORDER — DILTIAZEM HCL 30 MG PO TABS: 30.0000 mg | ORAL_TABLET | Freq: Three times a day (TID) | ORAL | 0 refills | Status: DC

## 2017-08-19 NOTE — Telephone Encounter (Signed)
Notified pt that cardizem 30mg  TID, #90 R: 0 has been sent to [facility] [address] Pt indicates he has not received refills from mail order pharmacy. [facility] confirmed receipt of prescription on 12/4, 8:38am. Pt will call [facility] today.

## 2017-08-19 NOTE — Telephone Encounter (Signed)
°*  STAT* If patient is at the pharmacy, call can be transferred to refill team.   1. Which medications need to be refilled? (please list name of each medication and dose if known) diltiazem (CARDIZEM) 30 MG taken 3 times daily  2. Which pharmacy/location (including street and city if local pharmacy) is medication to be sent to? [facility] but would like a 2 week supply sent to [address] [facility] while waiting for [facility] order  3. Do they need a 30 day or 90 day supply? 90 day

## 2017-08-20 ENCOUNTER — Other Ambulatory Visit: Payer: Self-pay

## 2017-08-20 ENCOUNTER — Ambulatory Visit (INDEPENDENT_AMBULATORY_CARE_PROVIDER_SITE_OTHER): Payer: 59

## 2017-08-20 DIAGNOSIS — R002 Palpitations: Secondary | ICD-10-CM

## 2017-08-20 DIAGNOSIS — R079 Chest pain, unspecified: Secondary | ICD-10-CM

## 2017-08-26 ENCOUNTER — Telehealth: Payer: Self-pay | Admitting: Cardiovascular Disease

## 2017-08-26 NOTE — Telephone Encounter (Signed)
Spoke with Minerva AreolaEric pharmacist at [facility] and reviewed that diltiazem extended release was discontinued and patient was placed back on Cardizem 30 mg three times daily. He read back information and changes confirmed with no further questions at this time.

## 2017-08-26 NOTE — Telephone Encounter (Signed)
Please call regarding Diltiazem doseage. Reference # [id number]

## 2017-09-02 ENCOUNTER — Ambulatory Visit: Payer: Self-pay | Admitting: Nurse Practitioner

## 2017-09-09 ENCOUNTER — Telehealth: Payer: Self-pay | Admitting: Cardiovascular Disease

## 2017-09-09 ENCOUNTER — Other Ambulatory Visit: Payer: Self-pay

## 2017-09-09 NOTE — Telephone Encounter (Signed)
Patient has changed insurances  He will need all his [facility] in to [facility]AP He is low on a few medications

## 2017-09-09 NOTE — Telephone Encounter (Signed)
LMOV for patient to call back so I can conform which medications need to be refilled.

## 2017-09-17 NOTE — Telephone Encounter (Signed)
Patient has an appointment tomorrow (09/18/2017 will make sure all refills get sent to the correct pharmacy.

## 2017-09-18 ENCOUNTER — Encounter: Payer: Self-pay | Admitting: Nurse Practitioner

## 2017-09-18 ENCOUNTER — Telehealth: Payer: Self-pay | Admitting: Cardiovascular Disease

## 2017-09-18 ENCOUNTER — Ambulatory Visit: Payer: Self-pay | Admitting: Nurse Practitioner

## 2017-09-18 ENCOUNTER — Telehealth: Payer: Self-pay | Admitting: *Deleted

## 2017-09-18 VITALS — BP 136/88 | HR 73 | Ht 74.0 in | Wt 322.8 lb

## 2017-09-18 DIAGNOSIS — R002 Palpitations: Secondary | ICD-10-CM

## 2017-09-18 DIAGNOSIS — I10 Essential (primary) hypertension: Secondary | ICD-10-CM

## 2017-09-18 DIAGNOSIS — R072 Precordial pain: Secondary | ICD-10-CM

## 2017-09-18 DIAGNOSIS — E119 Type 2 diabetes mellitus without complications: Secondary | ICD-10-CM

## 2017-09-18 MED ORDER — DILTIAZEM HCL 60 MG PO TABS: 60.0000 mg | ORAL_TABLET | Freq: Three times a day (TID) | ORAL | 3 refills | Status: DC

## 2017-09-18 NOTE — Telephone Encounter (Signed)
Patient here in the office today. [employer] monitor results needed. Report not available on [employer] website yet.  Patient mailed in monitor last week. [employer] representative reports they received monitor 2 days ago. She will go ahead and process the End of Summary report and fax it to us. Fax number verified.  This process will take about 10 minutes.

## 2017-09-18 NOTE — Patient Instructions (Signed)
Medication Instructions:  Your physician has recommended you make the following change in your medication:  1- INCREASE  Diltiazem to 60 mg by mouth every 8 hours.   Labwork: NONE  Testing/Procedures: NONE  Follow-Up: Your physician wants you to follow-up in: 6 MONTHS WITH DR Mariah MillingGOLLAN. You will receive a reminder letter in the mail two months in advance. If you don't receive a letter, please call our office to schedule the follow-up appointment.   If you need a refill on your cardiac medications before your next appointment, please call your pharmacy.

## 2017-09-18 NOTE — Progress Notes (Signed)
Office Visit    Patient Name: Hector Curry Date of Encounter: 09/18/2017  Primary Care Provider:  Marguarite Arbour, MD Primary Cardiologist:  Julien Nordmann, MD  Chief Complaint    57 y/o ? with a h/o nonobs CAD, diast dysfxn, dilated Ao root, palpitations/psvt, HTN, HL, obesity, sleep apnea, and family history of premature CAD, who presents for follow-up related to palpitations and chest pain.  Past Medical History    Past Medical History:  Diagnosis Date  . Allergic rhinitis   . Coronary artery disease, non-occlusive    a. 05/08/2015 Cath: no significant CAD, LVEF nl-->Med; b. 07/2017 MV: attenuation artifact, no ischemia, EF 65%-->Low risk.  . Diastolic dysfunction    a. 04/2015 Echo: EF 60-65%, mild MR, mildly dil LA, nl RV fxn, nl PASP; b. 05/2016 Echo: EF 60-65%, no rmwa, Gr1 DD, mildly dil Ao root and asc Ao; c. 07/2017 Echo: EF 55-60%, Ao root 42mm. mildly dil LA.  . Dilated aortic root (HCC)    a. 07/2017 Echo: 42mm Ao root - mildly dil.  . Diverticulosis 10/03/2014  . Family history of premature CAD    a. father passed from MI at 22  . GERD (gastroesophageal reflux disease)   . Hemorrhoids    a. internal hemorrhoids s/p surgery 1999  . History of tobacco abuse   . Hyperlipidemia   . Hyperplastic colon polyp 10/03/2014   a. x 2   . Hypertension   . Inflammatory arthritis    a. CCP antibodies & x-rays negative. Rheumatoid factor 14, felt to be crystaline over RA or psoriatic  . Kidney stones   . Morbid obesity (HCC)    a. s/p LAP-BAND  . OSA (obstructive sleep apnea)    a. on CPAP  . PSVT (paroxysmal supraventricular tachycardia) (HCC)    a. 48 hr Holter 04/2015: NSR w/ rare PVC, short runs of narrow complex tachycardiac, possible atrial tach, longest run 7 beats, PACs noted (2% of all beats 3600 total) they did not seem to correlate w/ significant arrythmia; b. 08/2017 Event monitor: no significant arrhythmias.   Past Surgical History:  Procedure Laterality  Date  . BARIATRIC SURGERY     lap band   . CARDIAC CATHETERIZATION N/A 05/08/2015   Procedure: Left Heart Cath and Coronary Angiography;  Surgeon: Corky Crafts, MD;  Location: [facility] INVASIVE CV LAB;  Service: Cardiovascular;  Laterality: N/A;  . CARPAL TUNNEL RELEASE Left   . CHOLECYSTECTOMY    . COLONOSCOPY  06/27/2005  . COLONOSCOPY  10/03/2014  . ESOPHAGOGASTRODUODENOSCOPY  06/27/2005  . HEMORRHOID SURGERY  1999  . TONSILLECTOMY      Allergies  No Known Allergies  History of Present Illness    57 year old male with the above complex past medical history including a family history of premature coronary artery disease, personal history of nonobstructive CAD, diastolic dysfunction, hypertension, hyperlipidemia, obesity, sleep apnea, dilated aortic root, and palpitations.  Previous cardiac evaluation includes a catheterization in September 2016 revealing no significant CAD.  Echocardiogram in 2016 showed normal LV function, as did echo in October 2017.  Regarding history of palpitations, he wore a Holter monitor in 2016 which showed short runs of narrow complex tachycardia.  He has been maintained on beta-blocker and short acting diltiazem therapy and uses propranolol as needed for elevated heart rates/palpitations.  He was recently seen in clinic in December after an ER visit where he had chest pain and palpitations.  Stress testing was undertaken and was low risk.  This was followed by an echocardiogram which showed normal LV function.  An event monitor was also placed and he did have episodes of elevated heart rate during wearing the monitor.  There were 6 events recorded, all showing sinus rhythm.  No significant arrhythmias were noted.  He says that he is most likely to experience elevated heart rate sometime around 3:30 to 4:00 in the morning.  He also notes that in addition to taking diltiazem 30 mrem every 8 hours, he has been taking propranolol pretty much 3 times a day.  He does not  express palpitations during the daytime hours.  He denies PND, orthopnea, dizziness, syncope, edema, or early satiety.  Home Medications    Prior to Admission medications   Medication Sig Start Date End Date Taking? Authorizing Provider  aspirin 81 MG tablet Take 81 mg by mouth daily.   Yes [provider]  benzonatate (TESSALON) 200 MG capsule Take 1 capsule (200 mg total) by mouth 3 (three) times daily as needed for cough. 01/01/17  Yes Fisher, Roselyn Bering, PA-C  bisoprolol-hydrochlorothiazide (ZIAC) 10-6.25 MG tablet TAKE 1 TABLET BY MOUTH  DAILY 06/24/17  Yes Gollan, Tollie Pizza, MD  cholecalciferol (VITAMIN D) 1000 UNITS tablet Take 1,000 Units by mouth daily.    Yes [provider]  cyanocobalamin ([employer] VITAMIN B12) 2000 MCG tablet Take 2,000 mcg by mouth daily.    Yes [provider]  diltiazem (CARDIZEM) 30 MG tablet Take 1 tablet (30 mg total) by mouth 3 (three) times daily. 07/28/17  Yes Antonieta Iba, MD  fenofibrate (TRICOR) 145 MG tablet Take 145 mg by mouth daily.  01/09/17  Yes [provider]  fluticasone (FLONASE) 50 MCG/ACT nasal spray Place 1 spray into both nostrils daily as needed for allergies or rhinitis.   Yes [provider]  glucosamine-chondroitin 500-400 MG tablet Take 1 tablet by mouth daily.    Yes [provider]  hydroxypropyl methylcellulose / hypromellose (ISOPTO TEARS / GONIOVISC) 2.5 % ophthalmic solution Place 1 drop into both eyes as needed for dry eyes.   Yes [provider]  Magnesium Oxide 400 (240 Mg) MG TABS Take 1 tablet (400 mg total) by mouth daily. 07/30/17  Yes Dunn, Raymon Mutton, PA-C  metFORMIN (GLUCOPHAGE) 850 MG tablet Take 1 tablet (850 mg total) by mouth 2 (two) times daily with a meal. Do not start before 16th of this month 05/11/15  Yes Leroy Sea, MD  Multiple Vitamin (MULTIVITAMIN WITH MINERALS) TABS tablet Take 1 tablet by mouth daily.   Yes [provider]  Potassium  Chloride ER 20 MEQ TBCR TAKE 1 TABLET BY MOUTH  TWICE A DAY 06/25/17  Yes Gollan, Tollie Pizza, MD  propranolol (INDERAL) 20 MG tablet TAKE 1 TABLET BY MOUTH 3  TIMES DAILY AS NEEDED 06/02/17  Yes Gollan, Tollie Pizza, MD    Review of Systems    Frequent palpitations occurring in the early morning hours.  He denies dyspnea, PND, orthopnea, dizziness, syncope, edema, or early satiety.  He does not typically experience palpitations during the daytime hours.  All other systems reviewed and are otherwise negative except as noted above.  Physical Exam    VS:  BP 136/88 (BP Location: Left Arm, Patient Position: Sitting, Cuff Size: Large)   Pulse 73   Ht 6\' 2"  (1.88 m)   Wt (!) 322 lb 12 oz (146.4 kg)   BMI 41.44 kg/m  , BMI Body mass index is 41.44 kg/m. GEN: Obese,  in no acute distress.  HEENT: normal.  Neck: Supple, obese, difficult to gauge JVP, no carotid bruits, or masses. Cardiac: RRR, no murmurs, rubs, or gallops. No clubbing, cyanosis, trace bilateral lower extremity edema.  Radials/DP/PT 2+ and equal bilaterally.  Respiratory:  Respirations regular and unlabored, clear to auscultation bilaterally. GI: Obese, soft, nontender, nondistended, BS + x 4. MS: no deformity or atrophy. Skin: warm and dry, no rash. Neuro:  Strength and sensation are intact. Psych: Normal affect.  Accessory Clinical Findings    Recent echocardiogram, stress test, and event monitor reviewed in detail with patient and results placed in past medical history as outlined above.  Assessment & Plan    1.  Chest pain/family history of premature CAD: Status post recent nonischemic/low risk stress test.  He sometimes notes mild chest discomfort in the setting of palpitations.  He remains on beta-blocker and diltiazem for palpitations.  I am actually going to titrate his diltiazem as he has been using propranolol on a regular basis.  That is prescribed as an as needed only medication.  He otherwise remains on aspirin  therapy.  2.  Palpitations/PSVT: Recent event monitor did not show any significant arrhythmias.  Activated events were associated with sinus rhythm.  He describes elevations in heart rate just before he wakes up in the morning, typically occurring sometime around 340 3:55 AM.  He does typically wake up between 4 and 4:30 AM.  He is using diltiazem 30 mg every 8 hours and also taking propranolol just about 3 times a day on a daily basis.  I am going to increase his diltiazem to 60 mg 3 times daily and have asked him to hold off on using propranolol unless he is experiencing palpitations.  As he does not typically experience palpitations during the daytime hours, hopefully we can curb some of his propranolol usage as he is also on bisoprolol, and save it  as a rescue medication only.  3.  Essential hypertension: Blood pressure mildly elevated today at 136/88.  As above, titrating diltiazem.  4.  Obstructive sleep apnea: He is compliant with CPAP.  5.  Hypertriglyceridemia: He remains on fenofibrate therapy.  6.  Type 2 diabetes mellitus: On metformin and followed by primary care.  A1c was 7.1 in August 2018.  He is not currently on a statin but is being treated for hypertriglyceridemia.  Would have a low threshold to add statin therapy.  Defer to primary care.  7.  Morbid obesity: He would benefit from outpatient diabetes/nutrition education and exercise.  8.  Disposition: Follow-up with Dr. Mariah MillingGollan in 3-6 months or sooner if necessary.  Nicolasa Duckinghristopher Coleston Dirosa, NP 09/18/2017, 4:42 PM

## 2017-09-18 NOTE — Telephone Encounter (Signed)
Patient spoke with Grace IsaacMike  Centivo over the phone prior to ov today and obtain referal for visit.   Auth # [id number] good for 120 days ( expires on 01/16/18 unlimited visits)

## 2017-10-05 ENCOUNTER — Other Ambulatory Visit: Payer: Self-pay

## 2017-10-05 DIAGNOSIS — R748 Abnormal levels of other serum enzymes: Secondary | ICD-10-CM

## 2017-10-29 ENCOUNTER — Other Ambulatory Visit: Payer: Self-pay

## 2017-10-29 MED ORDER — POTASSIUM CHLORIDE ER 20 MEQ PO TBCR: 1.0000 | EXTENDED_RELEASE_TABLET | Freq: Two times a day (BID) | ORAL | 0 refills | Status: DC

## 2017-10-29 NOTE — Telephone Encounter (Signed)
*  STAT* If patient is at the pharmacy, call can be transferred to refill team.   1. Which medications need to be refilled? (please list name of each medication and dose if known) Potassium  2. Which pharmacy/location (including street and city if local pharmacy) is medication to be sent to? [facility]  3. Do they need a 30 day or 90 day supply? 90

## 2017-12-22 ENCOUNTER — Telehealth: Payer: Self-pay | Admitting: Internal Medicine

## 2017-12-22 DIAGNOSIS — G4733 Obstructive sleep apnea (adult) (pediatric): Secondary | ICD-10-CM

## 2017-12-22 NOTE — Telephone Encounter (Signed)
Patient calling Has changed insurance and Hector Curry is now out of network Will need a prescription sent to [facility] Gave the fax of [phone]

## 2017-12-22 NOTE — Telephone Encounter (Signed)
Order placed but patient needs office visit. Called to make aware he needs to call office to schedule office visit.

## 2017-12-25 NOTE — Telephone Encounter (Signed)
Spoke with pt and informed him we may have to order another sleep study b/c the DME will require a new test. Nothing further needed.

## 2017-12-25 NOTE — Telephone Encounter (Signed)
Schedule Monday at 330

## 2017-12-25 NOTE — Telephone Encounter (Signed)
Patient would like to know since he has been scheduled will the prescription be sent in today, he would like to speak with nurse  Please call to discuss

## 2017-12-27 ENCOUNTER — Encounter: Payer: Self-pay | Admitting: Internal Medicine

## 2017-12-28 ENCOUNTER — Ambulatory Visit: Payer: No Typology Code available for payment source | Admitting: Internal Medicine

## 2017-12-28 ENCOUNTER — Encounter: Payer: Self-pay | Admitting: Internal Medicine

## 2017-12-28 VITALS — BP 140/82 | HR 85 | Resp 16 | Ht 74.0 in | Wt 324.0 lb

## 2017-12-28 DIAGNOSIS — G4733 Obstructive sleep apnea (adult) (pediatric): Secondary | ICD-10-CM | POA: Diagnosis not present

## 2017-12-28 NOTE — Patient Instructions (Signed)
You are doing great with your CPAP, continue using it every night.  

## 2017-12-28 NOTE — Progress Notes (Signed)
[facility] Consultation      Assessment and Plan:  Obstructive sleep apnea -Known severe obstructive sleep apnea, doing well on CPAP. - Currently on auto CPAP 12-20, will continue.  Atrial tachycardia/Atrial fibrillation of uncertain etiology. -May be related to obstructive sleep apnea.  Essential Hypertension.  -We will related to obstructive sleep apnea.  Morbid obesity. -Likely contributory to obstructive sleep apnea.  Orders Placed This Encounter  Procedures  . Ambulatory Referral for DME-- for CPAP supplies.     Return in about 1 year (around 12/29/2018).     Date: 12/28/2017  MRN# [id number] Hector Curry 10-25-1960  Referring Physician: Dr. Judithann Sheen.   Hector Curry is a 57 y.o. old male seen in consultation for chief complaint of:    Chief Complaint  Patient presents with  . Sleep Apnea    pt wears cpap nightly Auto 12-20cm h20. Patient needs new cpap supplies DME: [facility]    HPI:  The patient is a 57 year old male, last seen here about 2 years ago.  He has a history of obstructive sleep apnea complicating atrial fibrillation, morbid obesity, essential hypertension.  We last adjusted his CPAP to auto 12-20. He continues to do well with his PAP machine.  **I personally reviewed download data 30 days as of 12/27/2017.  Uses greater than 4 hours is 30/30 days.  Average usage on days used is 7 hours 16 minutes.  Pressure ranges 12-20.  Median pressure is 15, 95th percentile pressure is 18, maximum pressure is 19.  Residual AHI is 0.7.  This is consistent with excellent compliance with CPAP, as well as excellent control of obstructive sleep apnea.   Allergies:  Patient has no known allergies.  Review of Systems: Gen:  Denies  fever, sweats, chills HEENT: Denies blurred vision, double vision. bleeds, sore throat Cvc:  No dizziness, chest pain. Resp:   Denies cough or sputum production, shortness of breath Gi: Denies swallowing difficulty,  stomach pain. Gu:  Denies bladder incontinence, burning urine Ext:   No Joint pain, stiffness. Skin: No skin rash,  hives  Endoc:  No polyuria, polydipsia. Psych: No depression, insomnia. Other:  All other systems were reviewed with the patient and were negative other that what is mentioned in the HPI.   Physical Examination:   VS: BP 140/82 (BP Location: Left Arm, Cuff Size: Large)   Pulse 85   Resp 16   Ht  (1.88 m)   Wt (!) 324 lb (147 kg)   SpO2 93%   BMI 41.60 kg/m   General Appearance: No distress  Neuro:without focal findings,  speech normal,  HEENT: PERRLA, EOM intact.  [place] 2 Pulmonary: normal breath sounds, No wheezing.  CardiovascularNormal S1,S2.  No m/r/g.   Abdomen: Benign, Soft, non-tender. Renal:  No costovertebral tenderness  GU:  No performed at this time. Endoc: No evident thyromegaly, no signs of acromegaly. Skin:   warm, no rashes, no ecchymosis  Extremities: normal, no cyanosis, clubbing.  Other findings:    LABORATORY PANEL:   CBC No results for input(s): WBC, HGB, HCT, PLT in the last 168 hours. ------------------------------------------------------------------------------------------------------------------  Chemistries  No results for input(s): NA, K, CL, CO2, GLUCOSE, BUN, CREATININE, CALCIUM, MG, AST, ALT, ALKPHOS, BILITOT in the last 168 hours.  Invalid input(s): GFRCGP ------------------------------------------------------------------------------------------------------------------  Cardiac Enzymes No results for input(s): TROPONINI in the last 168 hours. ------------------------------------------------------------  RADIOLOGY:  No results found.     Thank  you for the consultation and for allowing [facility] to assist in the care of your patient. Our recommendations are noted above.  Please contact us if we can be of further service.   Wells Guiles, MD.  Board Certified in Internal  Medicine, Pulmonary Medicine, Critical Care Medicine, and Sleep Medicine.  [facility] Office Number: [phone]  Santiago Glad, M.D.  Billy Fischer, M.D  12/28/2017

## 2017-12-29 ENCOUNTER — Telehealth: Payer: Self-pay | Admitting: Internal Medicine

## 2017-12-29 DIAGNOSIS — G4733 Obstructive sleep apnea (adult) (pediatric): Secondary | ICD-10-CM

## 2017-12-29 NOTE — Telephone Encounter (Signed)
Hector Curry with [facility] calling needing an itemized RX for CPAP  He states it needs to have Mask Tubing Filter Water Chamber  Please fax this to [phone]

## 2017-12-29 NOTE — Telephone Encounter (Signed)
New order submitted. Nothing further needed.

## 2018-01-25 ENCOUNTER — Other Ambulatory Visit: Payer: Self-pay

## 2018-01-25 ENCOUNTER — Telehealth: Payer: Self-pay | Admitting: Cardiovascular Disease

## 2018-01-25 MED ORDER — POTASSIUM CHLORIDE ER 20 MEQ PO TBCR: 1.0000 | EXTENDED_RELEASE_TABLET | Freq: Two times a day (BID) | ORAL | 0 refills | Status: DC

## 2018-01-25 NOTE — Telephone Encounter (Signed)
°*  STAT* If patient is at the pharmacy, call can be transferred to refill team.   1. Which medications need to be refilled? (please list name of each medication and dose if known) potassium chloride ER 20 MEQ  2. Which pharmacy/location (including street and city if local pharmacy) is medication to be sent to? [facility]   3. Do they need a 30 day or 90 day supply? 90 day

## 2018-01-25 NOTE — Telephone Encounter (Signed)
Potassium Chloride ER 20 MEQ TBCR 180 tablet 0 01/25/2018    Sig - Route: Take 1 tablet by mouth 2 (two) times daily. - Oral   Sent to pharmacy as: Potassium Chloride ER 20 MEQ Tab CR   E-Prescribing Status: Transmission to pharmacy in progress (01/25/2018 9:39 AM EDT)   Pharmacy   [facility] [place], [place] - [address]

## 2018-02-21 NOTE — Progress Notes (Signed)
Patient ID: Hector Curry, male   DOB: 06-08-1961, 57 y.o.   MRN: [id number] Cardiology Office Note  Date:  02/23/2018   ID:  Hector Curry, DOB 1961-03-23, MRN [id number]  PCP:  Marguarite Arbour, MD   Chief Complaint  Patient presents with  . OTHER    6 month f/u no complaints today. Meds reviewed verbally with pt.    HPI:  [name] is a pleasant 57 year old male with history of  obesity, LAP-BAND,  obstructive sleep apnea who wears BIPAP,  15 years of smoking who stopped several years ago,  strong family history of coronary artery disease with father who passed away from MI in his late 80s,  Previous episode of chest pain, cardiac catheterization 04/2015 showing no significant coronary disease who presents For follow-up Of his atrial tachycardia  In general reports that he is feeling well Weight stable Periodically takes propranolol for tachycardia No leg swelling, no significant shortness of breath  no exercise program   Improved hemoglobin A1c 7.1 Total cholesterol 180, not on a statin On fenofibrate and flaxseed oil for elevated triglycerides  EKG personally reviewed by myself on todays visit Shows normal sinus rhythm rate 71 bpm no significant ST or T-wave changes  Other past medical history In early September 2016, reported having tachycardia He went to the emergency room, reported having EKG at that time documenting tachycardia. None is available in the computer  30 day monitor was ordered showing no significant arrhythmia He does report that he had a episode of tachycardia but he did not hit the button He did show APCs  Prior Holter monitor showed short runs of atrial tachycardia Many of his symptoms are at nighttime. He does have sleep apnea, wears CPAP, scheduled for repeat sleep study. Last was 15 years ago  Prior episodes of chest pain. Symptoms typically presenting at nighttime Symptoms are rare but will wake him up from sleep.  Not been  exercising secondary to chronic joint issues  PMH:   has a past medical history of Allergic rhinitis, Coronary artery disease, non-occlusive, Diastolic dysfunction, Dilated aortic root (HCC), Diverticulosis (10/03/2014), Family history of premature CAD, GERD (gastroesophageal reflux disease), Hemorrhoids, History of tobacco abuse, Hyperlipidemia, Hyperplastic colon polyp (10/03/2014), Hypertension, Inflammatory arthritis, Kidney stones, Morbid obesity (HCC), OSA (obstructive sleep apnea), and PSVT (paroxysmal supraventricular tachycardia) (HCC).  PSH:    Past Surgical History:  Procedure Laterality Date  . BARIATRIC SURGERY     lap band   . CARDIAC CATHETERIZATION N/A 05/08/2015   Procedure: Left Heart Cath and Coronary Angiography;  Surgeon: Corky Crafts, MD;  Location: [facility] INVASIVE CV LAB;  Service: Cardiovascular;  Laterality: N/A;  . CARPAL TUNNEL RELEASE Left   . CHOLECYSTECTOMY    . COLONOSCOPY  06/27/2005  . COLONOSCOPY  10/03/2014  . ESOPHAGOGASTRODUODENOSCOPY  06/27/2005  . HEMORRHOID SURGERY  1999  . TONSILLECTOMY      Current Outpatient Medications  Medication Sig Dispense Refill  . aspirin 81 MG tablet Take 81 mg by mouth daily.    . benzonatate (TESSALON) 200 MG capsule Take 1 capsule (200 mg total) by mouth 3 (three) times daily as needed for cough. 30 capsule 0  . bisoprolol-hydrochlorothiazide (ZIAC) 10-6.25 MG tablet TAKE 1 TABLET BY MOUTH  DAILY 90 tablet 3  . cholecalciferol (VITAMIN D) 1000 UNITS tablet Take 1,000 Units by mouth daily.     . cyanocobalamin ([facility] VITAMIN B12) 2000 MCG tablet Take 2,000 mcg by mouth daily.     Marland Kitchen  diltiazem (CARDIZEM) 60 MG tablet Take 1 tablet (60 mg total) by mouth every 8 (eight) hours. 270 tablet 3  . fenofibrate (TRICOR) 145 MG tablet Take 145 mg by mouth daily.     . fluticasone (FLONASE) 50 MCG/ACT nasal spray Place 1 spray into both nostrils daily as needed for allergies or rhinitis.    Marland Kitchen. glucosamine-chondroitin 500-400 MG tablet  Take 1 tablet by mouth daily.     . hydroxypropyl methylcellulose / hypromellose (ISOPTO TEARS / GONIOVISC) 2.5 % ophthalmic solution Place 1 drop into both eyes as needed for dry eyes.    . Magnesium Oxide 400 (240 Mg) MG TABS Take 1 tablet (400 mg total) by mouth daily. 30 tablet 0  . metFORMIN (GLUCOPHAGE) 850 MG tablet Take 1 tablet (850 mg total) by mouth 2 (two) times daily with a meal. Do not start before 16th of this month 60 tablet 0  . Multiple Vitamin (MULTIVITAMIN WITH MINERALS) TABS tablet Take 1 tablet by mouth daily.    . Potassium Chloride ER 20 MEQ TBCR Take 1 tablet by mouth 2 (two) times daily. 180 tablet 0  . propranolol (INDERAL) 20 MG tablet TAKE 1 TABLET BY MOUTH 3  TIMES DAILY AS NEEDED 270 tablet 2  . pioglitazone (ACTOS) 30 MG tablet Take 1 tablet (30 mg total) by mouth daily.     No current facility-administered medications for this visit.      Allergies:   Patient has no known allergies.   Social History:  The patient  reports that he quit smoking about 28 years ago. His smoking use included cigarettes. He has a 15.00 pack-year smoking history. He has never used smokeless tobacco. He reports that he does not drink alcohol or use drugs.   Family History:   family history includes CAD in his father; Diabetes in his mother and other; Heart attack (age of onset: 8349) in his father; Heart disease in his other; Hypertension in his mother, other, and sister.    Review of Systems: Review of Systems  Constitutional: Negative.   Respiratory: Negative.   Cardiovascular: Negative.   Gastrointestinal: Negative.   Musculoskeletal: Negative.   Neurological: Negative.   Psychiatric/Behavioral: Negative.   All other systems reviewed and are negative.    PHYSICAL EXAM: VS:  BP 116/70 (BP Location: Left Arm, Patient Position: Sitting, Cuff Size: Normal)   Pulse 71   Ht 6\' 2"  (1.88 m)   Wt (!) 326 lb 12 oz (148.2 kg)   BMI 41.95 kg/m  , BMI Body mass index is 41.95  kg/m. Constitutional:  oriented to person, place, and time. No distress. Obese HENT:  Head: Normocephalic and atraumatic.  Eyes:  no discharge. No scleral icterus.  Neck: Normal range of motion. Neck supple. No JVD present.  Cardiovascular: Normal rate, regular rhythm, normal heart sounds and intact distal pulses. Exam reveals no gallop and no friction rub. No edema No murmur heard. Pulmonary/Chest: Effort normal and breath sounds normal. No stridor. No respiratory distress.  no wheezes.  no rales.  no tenderness.  Abdominal: Soft.  no distension.  no tenderness.  Musculoskeletal: Normal range of motion.  no  tenderness or deformity.  Neurological:  normal muscle tone. Coordination normal. No atrophy Skin: Skin is warm and dry. No rash noted. not diaphoretic.  Psychiatric:  normal mood and affect. behavior is normal. Thought content normal.   Recent Labs: 07/28/2017: BUN 15; Creatinine, Ser 0.81; Hemoglobin 14.8; Platelets 215; Potassium 3.8; Sodium 136 07/29/2017: Magnesium 1.9; TSH  0.815    Lipid Panel No results found for: CHOL, HDL, LDLCALC, TRIG    Wt Readings from Last 3 Encounters:  02/23/18 (!) 326 lb 12 oz (148.2 kg)  12/28/17 (!) 324 lb (147 kg)  09/18/17 (!) 322 lb 12 oz (146.4 kg)      ASSESSMENT AND PLAN:  Atrial tachycardia (HCC) - Plan: EKG 12-Lead Rare episodes of breakthrough tachycardia, treated with propranolol PRN No further workup at this time  Coronary artery disease, non-occlusive - Plan: EKG 12-Lead Currently with no symptoms of angina. No further workup at this time. Continue current medication regimen. Previous cardiac catheterization 2016 with no significant coronary disease  Uncontrolled type 2 diabetes mellitus without complication, without long-term current use of insulin (HCC) Hemoglobin A1c 7.1, improvement on Actos   Hypertriglyceridemia On fenofibrate , flaxseed oil Recommend walking program for weight loss We did offer concentrated fish  oil prescription, he prefers to hold off at this time  Elevated LFTs Likely secondary to fatty liver Recommended weight loss   Total encounter time more than 15 minutes  Greater than 50% was spent in counseling and coordination of care with the patient  Disposition:   F/U  12 months   Orders Placed This Encounter  Procedures  . EKG 12-Lead    Total encounter time more than 15 minutes  Greater than 50% was spent in counseling and coordination of care with the patient   Signed, Dossie Arbour, M.D., Ph.D. 02/23/2018  [facility] [place], [place] [phone]

## 2018-02-23 ENCOUNTER — Telehealth: Payer: Self-pay | Admitting: Cardiovascular Disease

## 2018-02-23 ENCOUNTER — Ambulatory Visit (INDEPENDENT_AMBULATORY_CARE_PROVIDER_SITE_OTHER): Payer: No Typology Code available for payment source | Admitting: Cardiovascular Disease

## 2018-02-23 ENCOUNTER — Encounter: Payer: Self-pay | Admitting: Cardiovascular Disease

## 2018-02-23 VITALS — BP 116/70 | HR 71 | Ht 74.0 in | Wt 326.8 lb

## 2018-02-23 DIAGNOSIS — I471 Supraventricular tachycardia: Secondary | ICD-10-CM | POA: Diagnosis not present

## 2018-02-23 DIAGNOSIS — E1165 Type 2 diabetes mellitus with hyperglycemia: Secondary | ICD-10-CM

## 2018-02-23 DIAGNOSIS — I4719 Other supraventricular tachycardia: Secondary | ICD-10-CM

## 2018-02-23 DIAGNOSIS — R0602 Shortness of breath: Secondary | ICD-10-CM

## 2018-02-23 DIAGNOSIS — G4733 Obstructive sleep apnea (adult) (pediatric): Secondary | ICD-10-CM

## 2018-02-23 DIAGNOSIS — Z87891 Personal history of nicotine dependence: Secondary | ICD-10-CM

## 2018-02-23 NOTE — Patient Instructions (Signed)

## 2018-02-23 NOTE — Telephone Encounter (Signed)
Pt arrived at appt today at [facility] showing an authorization number he had saved on his phone stating "for today's visit." AUTH# [id number].

## 2018-04-19 ENCOUNTER — Telehealth: Payer: Self-pay | Admitting: Cardiovascular Disease

## 2018-04-19 MED ORDER — POTASSIUM CHLORIDE ER 20 MEQ PO TBCR: 1.0000 | EXTENDED_RELEASE_TABLET | Freq: Two times a day (BID) | ORAL | 3 refills | Status: DC

## 2018-04-19 NOTE — Telephone Encounter (Signed)
°*  STAT* If patient is at the pharmacy, call can be transferred to refill team.   1. Which medications need to be refilled? (please list name of each medication and dose if known) Potassium   2. Which pharmacy/location (including street and city if local pharmacy) is medication to be sent to? [facility]   3. Do they need a 30 day or 90 day supply? 90 day

## 2018-05-12 ENCOUNTER — Other Ambulatory Visit
Admission: RE | Admit: 2018-05-12 | Discharge: 2018-05-12 | Disposition: A | Discharge status: Home / Self Care | Payer: No Typology Code available for payment source | Source: Ambulatory Visit | Attending: Internal Medicine | Admitting: Internal Medicine

## 2018-05-12 DIAGNOSIS — E7800 Pure hypercholesterolemia, unspecified: Secondary | ICD-10-CM | POA: Diagnosis not present

## 2018-05-12 DIAGNOSIS — Z79899 Other long term (current) drug therapy: Secondary | ICD-10-CM | POA: Diagnosis not present

## 2018-05-12 DIAGNOSIS — E119 Type 2 diabetes mellitus without complications: Secondary | ICD-10-CM | POA: Insufficient documentation

## 2018-05-12 DIAGNOSIS — M2550 Pain in unspecified joint: Secondary | ICD-10-CM | POA: Insufficient documentation

## 2018-05-12 DIAGNOSIS — I10 Essential (primary) hypertension: Secondary | ICD-10-CM | POA: Diagnosis present

## 2018-05-12 DIAGNOSIS — Z125 Encounter for screening for malignant neoplasm of prostate: Secondary | ICD-10-CM | POA: Insufficient documentation

## 2018-05-12 LAB — COMPREHENSIVE METABOLIC PANEL
ALBUMIN: 4.2 g/dL (ref 3.5–5.0)
ALK PHOS: 52 U/L (ref 38–126)
ALT: 107 U/L — ABNORMAL HIGH (ref 0–44)
ANION GAP: 9 (ref 5–15)
AST: 75 U/L — ABNORMAL HIGH (ref 15–41)
BILIRUBIN TOTAL: 0.7 mg/dL (ref 0.3–1.2)
BUN: 17 mg/dL (ref 6–20)
CALCIUM: 9.2 mg/dL (ref 8.9–10.3)
CO2: 28 mmol/L (ref 22–32)
Chloride: 101 mmol/L (ref 98–111)
Creatinine, Ser: 0.79 mg/dL (ref 0.61–1.24)
GFR calc non Af Amer: >60 mL/min (ref >60)
GLUCOSE: 151 mg/dL — AB (ref 70–99)
Potassium: 3.9 mmol/L (ref 3.5–5.1)
Sodium: 138 mmol/L (ref 135–145)
TOTAL PROTEIN: 7.7 g/dL (ref 6.5–8.1)

## 2018-05-12 LAB — CBC WITH DIFFERENTIAL/PLATELET
BASOS ABS: 0 10*3/uL (ref 0–0.1)
BASOS PCT: 1 %
EOS ABS: 0.2 10*3/uL (ref 0–0.7)
EOS PCT: 3 %
HEMATOCRIT: 41.3 % (ref 40.0–52.0)
Hemoglobin: 14.2 g/dL (ref 13.0–18.0)
Lymphocytes Relative: 21 %
Lymphs Abs: 1.6 10*3/uL (ref 1.0–3.6)
MCH: 29.5 pg (ref 26.0–34.0)
MCHC: 34.4 g/dL (ref 32.0–36.0)
MCV: 85.8 fL (ref 80.0–100.0)
MONO ABS: 0.8 10*3/uL (ref 0.2–1.0)
Monocytes Relative: 10 %
NEUTROS ABS: 5 10*3/uL (ref 1.4–6.5)
Neutrophils Relative %: 65 %
PLATELETS: 196 10*3/uL (ref 150–440)
RBC: 4.81 MIL/uL (ref 4.40–5.90)
RDW: 14.8 % — AB (ref 11.5–14.5)
WBC: 7.6 10*3/uL (ref 3.8–10.6)

## 2018-05-12 LAB — LIPID PANEL
CHOLESTEROL: 197 mg/dL (ref 0–200)
HDL: 30 mg/dL — ABNORMAL LOW (ref >40)
LDL Cholesterol: 114 mg/dL — ABNORMAL HIGH (ref 0–99)
TRIGLYCERIDES: 266 mg/dL — AB (ref <150)
Total CHOL/HDL Ratio: 6.6 RATIO
VLDL: 53 mg/dL — ABNORMAL HIGH (ref 0–40)

## 2018-05-12 LAB — TSH: TSH: 1.121 u[IU]/mL (ref 0.350–4.500)

## 2018-05-12 LAB — URIC ACID: Uric Acid, Serum: 4.4 mg/dL (ref 3.7–8.6)

## 2018-05-12 LAB — SEDIMENTATION RATE: SED RATE: 9 mm/h (ref 0–20)

## 2018-05-12 LAB — PSA: Prostatic Specific Antigen: 0.56 ng/mL (ref 0.00–4.00)

## 2018-05-13 LAB — RHEUMATOID FACTOR: Rhuematoid fact SerPl-aCnc: <10 IU/mL (ref 0.0–13.9)

## 2018-05-13 LAB — MISC LABCORP TEST (SEND OUT): Labcorp test code: 340897

## 2018-05-13 LAB — HEMOGLOBIN A1C
HEMOGLOBIN A1C: 6.5 % — AB (ref 4.8–5.6)
Mean Plasma Glucose: 140 mg/dL

## 2018-05-14 ENCOUNTER — Other Ambulatory Visit
Admission: RE | Admit: 2018-05-14 | Discharge: 2018-05-14 | Disposition: A | Discharge status: Home / Self Care | Payer: No Typology Code available for payment source | Source: Ambulatory Visit | Attending: Internal Medicine | Admitting: Internal Medicine

## 2018-05-14 DIAGNOSIS — I10 Essential (primary) hypertension: Secondary | ICD-10-CM | POA: Diagnosis not present

## 2018-05-14 LAB — URINALYSIS, COMPLETE (UACMP) WITH MICROSCOPIC
BACTERIA UA: NONE SEEN
Bilirubin Urine: NEGATIVE
GLUCOSE, UA: 50 mg/dL — AB
HGB URINE DIPSTICK: NEGATIVE
Ketones, ur: NEGATIVE mg/dL
LEUKOCYTES UA: NEGATIVE
NITRITE: NEGATIVE
PROTEIN: NEGATIVE mg/dL
SPECIFIC GRAVITY, URINE: 1.02 (ref 1.005–1.030)
pH: 6 (ref 5.0–8.0)

## 2018-09-29 ENCOUNTER — Other Ambulatory Visit: Payer: Self-pay

## 2018-09-29 MED ORDER — DILTIAZEM HCL 60 MG PO TABS: 60.0000 mg | ORAL_TABLET | Freq: Three times a day (TID) | ORAL | 2 refills | Status: DC

## 2018-09-29 NOTE — Telephone Encounter (Signed)
*  STAT* If patient is at the pharmacy, call can be transferred to refill team.   1. Which medications need to be refilled? (please list name of each medication and dose if known) DILTIAZEM  2. Which pharmacy/location (including street and city if local pharmacy) is medication to be sent to?[facility]  3. Do they need a 30 day or 90 day supply? 90

## 2018-12-27 MED FILL — PIOGLITAZONE HCL 30 MG TAB: 30 | 90 days supply | Qty: 90 | Fill #0

## 2018-12-27 MED FILL — dilTIAZem HCL 60 MG TABS: 60 | 90 days supply | Qty: 270 | Fill #0

## 2018-12-27 MED FILL — BISOPROLOL-HCTZ 10-6.25 MG: 10-6.25 | 90 days supply | Qty: 90 | Fill #0

## 2019-02-26 ENCOUNTER — Encounter (HOSPITAL_COMMUNITY): Payer: Self-pay | Admitting: Emergency Medicine

## 2019-02-26 ENCOUNTER — Emergency Department (HOSPITAL_COMMUNITY)
Admission: EM | Admit: 2019-02-26 | Discharge: 2019-02-26 | Disposition: A | Discharge status: Home / Self Care | Payer: No Typology Code available for payment source | Attending: Emergency Medicine | Admitting: Emergency Medicine

## 2019-02-26 ENCOUNTER — Other Ambulatory Visit: Payer: Self-pay

## 2019-02-26 DIAGNOSIS — Z87891 Personal history of nicotine dependence: Secondary | ICD-10-CM | POA: Insufficient documentation

## 2019-02-26 DIAGNOSIS — W57XXXA Bitten or stung by nonvenomous insect and other nonvenomous arthropods, initial encounter: Secondary | ICD-10-CM | POA: Diagnosis not present

## 2019-02-26 DIAGNOSIS — Y93H2 Activity, gardening and landscaping: Secondary | ICD-10-CM | POA: Insufficient documentation

## 2019-02-26 DIAGNOSIS — S80861A Insect bite (nonvenomous), right lower leg, initial encounter: Secondary | ICD-10-CM | POA: Diagnosis not present

## 2019-02-26 DIAGNOSIS — Y999 Unspecified external cause status: Secondary | ICD-10-CM | POA: Diagnosis not present

## 2019-02-26 DIAGNOSIS — I10 Essential (primary) hypertension: Secondary | ICD-10-CM | POA: Diagnosis not present

## 2019-02-26 DIAGNOSIS — E119 Type 2 diabetes mellitus without complications: Secondary | ICD-10-CM | POA: Insufficient documentation

## 2019-02-26 DIAGNOSIS — Z7984 Long term (current) use of oral hypoglycemic drugs: Secondary | ICD-10-CM | POA: Diagnosis not present

## 2019-02-26 DIAGNOSIS — Z79899 Other long term (current) drug therapy: Secondary | ICD-10-CM | POA: Insufficient documentation

## 2019-02-26 DIAGNOSIS — Z7982 Long term (current) use of aspirin: Secondary | ICD-10-CM | POA: Insufficient documentation

## 2019-02-26 DIAGNOSIS — Y92007 Garden or yard of unspecified non-institutional (private) residence as the place of occurrence of the external cause: Secondary | ICD-10-CM | POA: Insufficient documentation

## 2019-02-26 LAB — CBC WITH DIFFERENTIAL/PLATELET
Abs Immature Granulocytes: 0.04 10*3/uL (ref 0.00–0.07)
Basophils Absolute: 0.1 10*3/uL (ref 0.0–0.1)
Basophils Relative: 1 %
Eosinophils Absolute: 0.3 10*3/uL (ref 0.0–0.5)
Eosinophils Relative: 3 %
HCT: 44.6 % (ref 39.0–52.0)
Hemoglobin: 14.1 g/dL (ref 13.0–17.0)
Immature Granulocytes: 1 %
Lymphocytes Relative: 24 %
Lymphs Abs: 1.9 10*3/uL (ref 0.7–4.0)
MCH: 28.2 pg (ref 26.0–34.0)
MCHC: 31.6 g/dL (ref 30.0–36.0)
MCV: 89.2 fL (ref 80.0–100.0)
Monocytes Absolute: 0.8 10*3/uL (ref 0.1–1.0)
Monocytes Relative: 10 %
Neutro Abs: 4.9 10*3/uL (ref 1.7–7.7)
Neutrophils Relative %: 61 %
Platelets: 207 10*3/uL (ref 150–400)
RBC: 5 MIL/uL (ref 4.22–5.81)
RDW: 14.5 % (ref 11.5–15.5)
WBC: 7.9 10*3/uL (ref 4.0–10.5)
nRBC: 0 % (ref 0.0–0.2)

## 2019-02-26 LAB — BASIC METABOLIC PANEL
Anion gap: 12 (ref 5–15)
BUN: 16 mg/dL (ref 6–20)
CO2: 25 mmol/L (ref 22–32)
Calcium: 9.6 mg/dL (ref 8.9–10.3)
Chloride: 101 mmol/L (ref 98–111)
Creatinine, Ser: 0.84 mg/dL (ref 0.61–1.24)
GFR calc Af Amer: >60 mL/min (ref >60)
GFR calc non Af Amer: >60 mL/min (ref >60)
Glucose, Bld: 139 mg/dL — ABNORMAL HIGH (ref 70–99)
Potassium: 3.9 mmol/L (ref 3.5–5.1)
Sodium: 138 mmol/L (ref 135–145)

## 2019-02-26 NOTE — ED Provider Notes (Signed)
[facility] EMERGENCY DEPARTMENT Provider Note   CSN: [id number] Arrival date & time: 02/26/19  1048     History   Chief Complaint Chief Complaint  Patient presents with  . Tick Removal    HPI Lynann BolognaGary K Cunningham is a 58 y.o. male.     Patient is a 58 year old male who presents to the emergency department following a tick bite.  The patient states that approximately a week ago after mowing the lawn he thought he noticed a scab on the back of his leg, but his wife examined it and determined it was a tick.  She removed the tick.  The patient then went to see outpatient clinic where he was placed on doxycycline.  The patient states he has been on 2 days of doxycycline and now he says that the redness of his lower leg seems to be worse instead of better.  He was concerned as to whether or not the head or other portion of the tick may still be in his skin. No fever, chills, headache or   The history is provided by the patient.    Past Medical History:  Diagnosis Date  . Allergic rhinitis   . Coronary artery disease, non-occlusive    a. 05/08/2015 Cath: no significant CAD, LVEF nl-->Med; b. 07/2017 MV: attenuation artifact, no ischemia, EF 65%-->Low risk.  . Diastolic dysfunction    a. 04/2015 Echo: EF 60-65%, mild MR, mildly dil LA, nl RV fxn, nl PASP; b. 05/2016 Echo: EF 60-65%, no rmwa, Gr1 DD, mildly dil Ao root and asc Ao; c. 07/2017 Echo: EF 55-60%, Ao root 42mm. mildly dil LA.  . Dilated aortic root (HCC)    a. 07/2017 Echo: 42mm Ao root - mildly dil.  . Diverticulosis 10/03/2014  . Family history of premature CAD    a. father passed from MI at 5949  . GERD (gastroesophageal reflux disease)   . Hemorrhoids    a. internal hemorrhoids s/p surgery 1999  . History of tobacco abuse   . Hyperlipidemia   . Hyperplastic colon polyp 10/03/2014   a. x 2   . Hypertension   . Inflammatory arthritis    a. CCP antibodies & x-rays negative. Rheumatoid factor 14, felt to be crystaline over RA  or psoriatic  . Kidney stones   . Morbid obesity (HCC)    a. s/p LAP-BAND  . OSA (obstructive sleep apnea)    a. on CPAP  . PSVT (paroxysmal supraventricular tachycardia) (HCC)    a. 48 hr Holter 04/2015: NSR w/ rare PVC, short runs of narrow complex tachycardiac, possible atrial tach, longest run 7 beats, PACs noted (2% of all beats 3600 total) they did not seem to correlate w/ significant arrythmia; b. 08/2017 Event monitor: no significant arrhythmias.    Patient Active Problem List   Diagnosis Date Noted  . History of kidney stones 05/30/2016  . Elevated LFTs 02/13/2016  . Morbid obesity with BMI of 40.0-44.9, adult (HCC) 12/20/2015  . Atrial tachycardia (HCC) 06/21/2015  . Hypertriglyceridemia 05/15/2015  . Coronary artery disease, non-occlusive   . Type II diabetes mellitus, uncontrolled (HCC) 05/08/2015  . Chest pain at rest   . Hyperglycemia 05/07/2015  . Chest pain 05/06/2015  . GERD (gastroesophageal reflux disease)   . Kidney stones   . Hemorrhoids   . Inflammatory arthritis   . Family history of premature CAD   . OSA (obstructive sleep apnea)   . History of tobacco abuse   . Palpitations   .  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/20/2014  . Diverticulosis 10/03/2014  . Hyperplastic colon polyp 10/03/2014  . HTN, goal below 140/90 08/07/2014  . Pain in the chest 06/19/2014  . SOB (shortness of breath) 06/19/2014  . Morbid obesity (HCC) 06/19/2014  . Hyperlipidemia 06/19/2014    Past Surgical History:  Procedure Laterality Date  . BARIATRIC SURGERY     lap band   . CARDIAC CATHETERIZATION N/A 05/08/2015   Procedure: Left Heart Cath and Coronary Angiography;  Surgeon: Corky CraftsJayadeep S Varanasi, MD;  Location[facility] INVASIVE CV LAB;  Service: Cardiovascular;  Laterality: N/A;  . CARPAL TUNNEL RELEASE Left   . CHOLECYSTECTOMY    . COLONOSCOPY  06/27/2005  . COLONOSCOPY  10/03/2014  . ESOPHAGOGASTRODUODENOSCOPY  06/27/2005  . HEMORRHOID SURGERY  1999  . TONSILLECTOMY           Home Medications    Prior to Admission medications   Medication Sig Start Date End Date Taking? Authorizing Provider  doxycycline (VIBRAMYCIN) 100 MG capsule Take 1 capsule by mouth 2 (two) times a day. 02/24/19 03/06/19 Yes [provider]  aspirin 81 MG tablet Take 81 mg by mouth daily.    [provider]  bisoprolol-hydrochlorothiazide [facility](ZIAC) 10-6.25 MG tablet TAKE 1 TABLET BY MOUTH  DAILY 06/24/17   Antonieta IbaGollan, Timothy J, MD  cholecalciferol (VITAMIN D) 1000 UNITS tablet Take 1,000 Units by mouth daily.     [provider]  cyanocobalamin ([facility] VITAMIN B12) 2000 MCG tablet Take 2,000 mcg by mouth daily.     [provider]  diltiazem (CARDIZEM) 60 MG tablet Take 1 tablet (60 mg total) by mouth every 8 (eight) hours. 09/29/18   Creig HinesBerge, Christopher Ronald, NP  fenofibrate (TRICOR) 145 MG tablet Take 145 mg by mouth daily.  01/09/17   [provider]  fluticasone (FLONASE) 50 MCG/ACT nasal spray Place 1 spray into both nostrils daily as needed for allergies or rhinitis.    [provider]  hydroxypropyl methylcellulose / hypromellose (ISOPTO TEARS / GONIOVISC) 2.5 % ophthalmic solution Place 1 drop into both eyes as needed for dry eyes.    [provider]  Magnesium Oxide 400 (240 Mg) MG TABS Take 1 tablet (400 mg total) by mouth daily. 07/30/17   Dunn, Raymon Muttonyan M, PA-C  metFORMIN (GLUCOPHAGE) 850 MG tablet Take 1 tablet (850 mg total) by mouth 2 (two) times daily with a meal. Do not start before 16th of this month 05/11/15   Leroy SeaSingh, Prashant K, MD  pioglitazone (ACTOS) 30 MG tablet Take 1 tablet (30 mg total) by mouth daily. 02/23/18   Antonieta IbaGollan, Timothy J, MD  Potassium Chloride ER 20 MEQ TBCR Take 1 tablet by mouth 2 (two) times daily. 04/19/18   Antonieta IbaGollan, Timothy J, MD  propranolol (INDERAL) 20 MG tablet TAKE 1 TABLET BY MOUTH 3  TIMES DAILY AS NEEDED 06/02/17   Antonieta IbaGollan, Timothy J, MD    Family History Family History  Problem Relation Age of Onset  .  Hypertension Mother   . Diabetes Mother   . Heart attack Father 449  . CAD Father   . Hypertension Sister   . Diabetes Other   . Hypertension Other   . Heart disease Other     Social History Social History   Tobacco Use  . Smoking status: Former Smoker    Packs/day: 1.00    Years: 15.00    Pack years: 15.00    Types: Cigarettes    Quit date: 08/05/1989    Years since  quitting: 29.5  . Smokeless tobacco: Never Used  Substance Use Topics  . Alcohol use: No  . Drug use: No     Allergies   Patient has no known allergies.   Review of Systems Review of Systems  Constitutional: Negative for activity change.       All ROS Neg except as noted in HPI  HENT: Negative for nosebleeds.   Eyes: Negative for photophobia and discharge.  Respiratory: Negative for cough, shortness of breath and wheezing.   Cardiovascular: Negative for chest pain and palpitations.  Gastrointestinal: Negative for abdominal pain and blood in stool.  Genitourinary: Negative for dysuria, frequency and hematuria.  Musculoskeletal: Negative for arthralgias, back pain and neck pain.  Skin: Negative.   Neurological: Negative for dizziness, seizures and speech difficulty.  Psychiatric/Behavioral: Negative for confusion and hallucinations.       Physical Exam Updated Vital Signs BP (!) 151/83   Pulse 77   Temp 97.7 F (36.5 C)   Resp 18   Ht 6\' 2"  (1.88 m)   Wt (!) 145.2 kg   SpO2 96%   BMI 41.09 kg/m   Physical Exam Vitals signs and nursing note reviewed.  Constitutional:      Appearance: He is well-developed. He is not toxic-appearing.  HENT:     Head: Normocephalic.     Right Ear: Tympanic membrane and external ear normal.     Left Ear: Tympanic membrane and external ear normal.  Eyes:     General: Lids are normal.     Pupils: Pupils are equal, round, and reactive to light.  Neck:     Musculoskeletal: Normal range of motion and neck supple.     Vascular: No carotid bruit.   Cardiovascular:     Rate and Rhythm: Normal rate and regular rhythm.     Pulses: Normal pulses.     Heart sounds: Normal heart sounds.  Pulmonary:     Effort: No respiratory distress.     Breath sounds: Normal breath sounds.  Abdominal:     General: Bowel sounds are normal.     Palpations: Abdomen is soft.     Tenderness: There is no abdominal tenderness. There is no guarding.  Musculoskeletal: Normal range of motion.     Comments: The area of the tick bite was examined with magnification, and there is no foreign body or portions of the tick that remain at the site.  There is increased redness around the tick bite area.  Please see the picture on the chart.  The area is not hot to touch.  There are no red streaks appreciated.  Lymphadenopathy:     Head:     Right side of head: No submandibular adenopathy.     Left side of head: No submandibular adenopathy.     Cervical: No cervical adenopathy.  Skin:    General: Skin is warm and dry.  Neurological:     Mental Status: He is alert and oriented to person, place, and time.     Cranial Nerves: No cranial nerve deficit.     Sensory: No sensory deficit.  Psychiatric:        Speech: Speech normal.        ED Treatments / Results  Labs (all labs ordered are listed, but only abnormal results are displayed) Labs Reviewed  CBC WITH DIFFERENTIAL/PLATELET  BASIC METABOLIC PANEL  ROCKY MTN SPOTTED FVR ABS PNL(IGG+IGM)    EKG None  Radiology No results found.  Procedures Procedures (including critical  care time)  Medications Ordered in ED Medications - No data to display   Initial Impression / Assessment and Plan / ED Course  I have reviewed the triage vital signs and the nursing notes.  Pertinent labs & imaging results that were available during my care of the patient were reviewed by me and considered in my medical decision making (see chart for details).          Final Clinical Impressions(s) / ED Diagnoses MDM   Vital signs reviewed.  Pulse oximetry is 97% on room air.  Within normal limits by my interpretation.  The patient sustained a tick bite to the lower extremity.  There is increased redness and some swelling present.  The patient is already been started on doxycycline.  There was question if a portion of the tick was remaining in the site.  There was no foreign body or portion of the tick noted Effie ShyWentz the area was studied under magnification.  Complete blood count and basic metabolic panel are negative.  A [place] spotted fever titer was obtained.  Patient was advised to cleanse the area daily with soap and water and apply a Band-Aid over the area of the initial bite.  The patient is to finish his doxycycline.  He is to return to the emergency department see the primary physician if any changes in his condition, problems, or concerns.  Patient in agreement with this plan.   Final diagnoses:  Tick bite, initial encounter    ED Discharge Orders    None       Ivery QualeBryant, Donasia Wimes, PA-C 02/27/19 1720    Samuel JesterMcManus, Kathleen, DO 02/28/19 [phone]

## 2019-02-26 NOTE — Discharge Instructions (Addendum)
Examination of your bite site under magnification is negative for any foreign body or portions of the tick that were not removed.  The area of increased redness shows very minimal increased warmth present the pulses of your foot and ankle are within normal limits.  Your complete blood count is negative for a systemic infection.  Your electrolytes are within normal limits.  A [place] spotted fever titer has been obtained.  Someone from our flow managers office will call you if there is an abnormality of this test.  Please cleanse the area daily with soap and water.  Please place a Band-Aid over the initial bite area to prevent further spread of any infection.  Please finish your doxycycline as requested.  Please see your doctor or return to the emergency department if there are red streaks going up your leg, or if there is pus like drainage from the bite site.  Return if there is increasing headache, rash, or body aches.

## 2019-02-26 NOTE — ED Triage Notes (Signed)
Pt was bit by a tick on the back of his right leg last Sunday.  Saw PCP and was out on ABX. Right leg swelling, redness and warm to the touch.

## 2019-02-28 ENCOUNTER — Ambulatory Visit: Payer: No Typology Code available for payment source | Admitting: Cardiovascular Disease

## 2019-03-01 ENCOUNTER — Telehealth: Payer: Self-pay | Admitting: Cardiovascular Disease

## 2019-03-01 LAB — ROCKY MTN SPOTTED FVR ABS PNL(IGG+IGM)
RMSF IgG: NEGATIVE
RMSF IgM: 0.16 index (ref 0.00–0.89)

## 2019-03-01 NOTE — Progress Notes (Signed)
Patient ID: Hector Curry, male   DOB: [birth year], 58 y.o.   MRN: [id number] Cardiology Office Note  Date:  03/02/2019   ID:  Hector Curry, DOB [birth year], MRN [id number]  PCP:  Marguarite ArbourSparks, Hector D, MD   Chief Complaint  Patient presents with  . Other    12 month follow up. patient c//o swelling in right leg due to a tick bite. Meds reviewed verbally with patient.     HPI:  Hector Curry is a pleasant 58 year old male with history of  obesity, LAP-BAND,  obstructive sleep apnea who wears BIPAP,  15 years of smoking who stopped several years ago,  strong family history of coronary artery disease with father who passed away from MI in his late 2440s,  Previous episode of chest pain, cardiac catheterization 04/2015 showing no significant coronary disease who presents For follow-up Of his atrial tachycardia  Tick bite, on doxy  Weight stable Periodically takes propranolol for tachycardia No leg swelling, no significant shortness of breath  no exercise program    hemoglobin A1c 6.5 Total cholesterol 197, not on a statin On fenofibrate and flaxseed oil for elevated triglycerides  No regular exercise program Weight continues to run high Denies any tachycardia  Long discussion concerning previous echocardiogram showing mildly dilated aortic root 4.2 cm in December 2018  EKG personally reviewed by myself on todays visit Shows normal sinus rhythm rate 75 bpm no significant ST or T-wave changes  Other past medical history In early September 2016, reported having tachycardia He went to the emergency room, reported having EKG at that time documenting tachycardia. None is available in the computer  30 day monitor was ordered showing no significant arrhythmia He does report that he had a episode of tachycardia but he did not hit the button He did show APCs  Prior Holter monitor showed short runs of atrial tachycardia Many of his symptoms are at nighttime. He does have sleep  apnea, wears CPAP, scheduled for repeat sleep study. Last was 15 years ago  Prior episodes of chest pain. Symptoms typically presenting at nighttime Symptoms are rare but will wake him up from sleep.  Not been exercising secondary to chronic joint issues  PMH:   has a past medical history of Allergic rhinitis, Coronary artery disease, non-occlusive, Diastolic dysfunction, Dilated aortic root (HCC), Diverticulosis (10/03/2014), Family history of premature CAD, GERD (gastroesophageal reflux disease), Hemorrhoids, History of tobacco abuse, Hyperlipidemia, Hyperplastic colon polyp (10/03/2014), Hypertension, Inflammatory arthritis, Kidney stones, Morbid obesity (HCC), OSA (obstructive sleep apnea), and PSVT (paroxysmal supraventricular tachycardia) (HCC).  PSH:    Past Surgical History:  Procedure Laterality Date  . BARIATRIC SURGERY     lap band   . CARDIAC CATHETERIZATION N/A 05/08/2015   Procedure: Left Heart Cath and Coronary Angiography;  Surgeon: Corky CraftsJayadeep S Varanasi, MD;  Location: [facility];  Service: Cardiovascular;  Laterality: N/A;  . CARPAL TUNNEL RELEASE Left   . CHOLECYSTECTOMY    . COLONOSCOPY  06/27/2005  . COLONOSCOPY  10/03/2014  . ESOPHAGOGASTRODUODENOSCOPY  06/27/2005  . HEMORRHOID SURGERY  1999  . TONSILLECTOMY      Current Outpatient Medications  Medication Sig Dispense Refill  . aspirin 81 MG tablet Take 81 mg by mouth daily.    . bisoprolol-hydrochlorothiazide (ZIAC) 10-6.25 MG tablet TAKE 1 TABLET BY MOUTH  DAILY 90 tablet 3  . cholecalciferol (VITAMIN D) 1000 UNITS tablet Take 1,000 Units by mouth daily.     . cyanocobalamin ([facility] VITAMIN B12) 2000 MCG tablet  Take 2,000 mcg by mouth daily.     Marland Kitchen. diltiazem (CARDIZEM) 60 MG tablet Take 1 tablet (60 mg total) by mouth every 8 (eight) hours. 270 tablet 2  . doxycycline (VIBRAMYCIN) 100 MG capsule Take 1 capsule by mouth 2 (two) times a day.    . fenofibrate (TRICOR) 145 MG tablet Take 145 mg by mouth daily.     .  fluticasone (FLONASE) 50 MCG/ACT nasal spray Place 1 spray into both nostrils daily as needed for allergies or rhinitis.    . hydroxypropyl methylcellulose / hypromellose (ISOPTO TEARS / GONIOVISC) 2.5 % ophthalmic solution Place 1 drop into both eyes as needed for dry eyes.    . Magnesium Oxide 400 (240 Mg) MG TABS Take 1 tablet (400 mg total) by mouth daily. (Patient taking differently: Take 1 tablet by mouth 2 (two) times a day. ) 30 tablet 0  . metFORMIN (GLUCOPHAGE) 850 MG tablet Take 1 tablet (850 mg total) by mouth 2 (two) times daily with a meal. Do not start before 16th of this month 60 tablet 0  . pioglitazone (ACTOS) 30 MG tablet Take 1 tablet (30 mg total) by mouth daily.    . Potassium Chloride ER 20 MEQ TBCR Take 1 tablet by mouth 2 (two) times daily. 180 tablet 3  . propranolol (INDERAL) 20 MG tablet TAKE 1 TABLET BY MOUTH 3  TIMES DAILY AS NEEDED 270 tablet 2   No current facility-administered medications for this visit.      Allergies:   Patient has no known allergies.   Social History:  The patient  reports that he quit smoking about 29 years ago. His smoking use included cigarettes. He has a 15.00 pack-year smoking history. He has never used smokeless tobacco. He reports that he does not drink alcohol or use drugs.   Family History:   family history includes CAD in his father; Diabetes in his mother and another family member; Heart attack (age of onset: 7549) in his father; Heart disease in an other family member; Hypertension in his mother, sister, and another family member.    Review of Systems: Review of Systems  Constitutional: Negative.   Respiratory: Negative.   Cardiovascular: Negative.   Gastrointestinal: Negative.   Musculoskeletal: Negative.   Neurological: Negative.   Psychiatric/Behavioral: Negative.   All other systems reviewed and are negative.    PHYSICAL EXAM: VS:  BP 140/76 (BP Location: Left Arm, Patient Position: Sitting, Cuff Size: Normal)    Pulse 75   Ht 6\' 2"  (1.88 m)   Wt (!) 328 lb (148.8 kg)   BMI 42.11 kg/m  , BMI Body mass index is 42.11 kg/m. Constitutional:  oriented to person, place, and time. No distress. Obese HENT:  Head: Normocephalic and atraumatic.  Eyes:  no discharge. No scleral icterus.  Neck: Normal range of motion. Neck supple. No JVD present.  Cardiovascular: Normal rate, regular rhythm, normal heart sounds and intact distal pulses. Exam reveals no gallop and no friction rub. No edema No murmur heard. Pulmonary/Chest: Effort normal and breath sounds normal. No stridor. No respiratory distress.  no wheezes.  no rales.  no tenderness.  Abdominal: Soft.  no distension.  no tenderness.  Musculoskeletal: Normal range of motion.  no  tenderness or deformity.  Neurological:  normal muscle tone. Coordination normal. No atrophy Skin: Skin is warm and dry. No rash noted. not diaphoretic.  Psychiatric:  normal mood and affect. behavior is normal. Thought content normal.   Recent Labs: 05/12/2018: ALT  107; TSH 1.121 02/26/2019: BUN 16; Creatinine, Ser 0.84; Hemoglobin 14.1; Platelets 207; Potassium 3.9; Sodium 138    Lipid Panel Lab Results  Component Value Date   CHOL 197 05/12/2018   HDL 30 (L) 05/12/2018   LDLCALC 114 (H) 05/12/2018   TRIG 266 (H) 05/12/2018      Wt Readings from Last 3 Encounters:  03/02/19 (!) 328 lb (148.8 kg)  02/26/19 (!) 320 lb (145.2 kg)  02/23/18 (!) 326 lb 12 oz (148.2 kg)      ASSESSMENT AND PLAN:  Atrial tachycardia ([place]) - Plan: EKG 12-Lead Continue current medications, bisoprolol and propranolol as needed  Coronary artery disease, non-occlusive - Plan: EKG 12-Lead Previous cardiac catheterization 2016 with no significant coronary disease Occasional atypical chest pain while sleeping  Uncontrolled type 2 diabetes mellitus without complication, without long-term current use of insulin (HCC) Hemoglobin A1c in the 6 range, recommended dietary restriction    Hypertriglyceridemia On fenofibrate , flaxseed oil Recommend walking program for weight loss Dietary changes  Elevated LFTs Likely secondary to fatty liver Recommended weight loss   Total encounter time more than 25 minutes  Greater than 50% was spent in counseling and coordination of care with the patient  Disposition:   F/U  12 months   Orders Placed This Encounter  Procedures  . EKG 12-Lead    Total encounter time more than 15 minutes  Greater than 50% was spent in counseling and coordination of care with the patient   Signed, Esmond Plants, M.D., Ph.D. 03/02/2019  [place], [place]

## 2019-03-01 NOTE — Telephone Encounter (Signed)
° ° °  COVID-19 Pre-Screening Questions:   In the past 7 to 10 days have you had a cough,  shortness of breath, headache, congestion, fever (100 or greater) body aches, chills, sore throat, or sudden loss of taste or sense of smell? 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 Contact the provider seeing the patient or DOD for further guidance.  If neither are available contact a member of the leadership team.  Please advise based on answers

## 2019-03-01 NOTE — Telephone Encounter (Signed)
Patient has an appointment with Dr. Rockey Situ on 03/02/19 at 3:20 pm.  To MD to review.

## 2019-03-02 ENCOUNTER — Other Ambulatory Visit: Payer: Self-pay

## 2019-03-02 ENCOUNTER — Encounter: Payer: Self-pay | Admitting: Cardiovascular Disease

## 2019-03-02 ENCOUNTER — Ambulatory Visit (INDEPENDENT_AMBULATORY_CARE_PROVIDER_SITE_OTHER): Payer: No Typology Code available for payment source | Admitting: Cardiovascular Disease

## 2019-03-02 VITALS — BP 140/76 | HR 75 | Ht 74.0 in | Wt 328.0 lb

## 2019-03-02 DIAGNOSIS — E1165 Type 2 diabetes mellitus with hyperglycemia: Secondary | ICD-10-CM | POA: Diagnosis not present

## 2019-03-02 DIAGNOSIS — I471 Supraventricular tachycardia: Secondary | ICD-10-CM

## 2019-03-02 DIAGNOSIS — I77810 Thoracic aortic ectasia: Secondary | ICD-10-CM

## 2019-03-02 DIAGNOSIS — Z87891 Personal history of nicotine dependence: Secondary | ICD-10-CM

## 2019-03-02 DIAGNOSIS — G4733 Obstructive sleep apnea (adult) (pediatric): Secondary | ICD-10-CM

## 2019-03-02 DIAGNOSIS — I10 Essential (primary) hypertension: Secondary | ICD-10-CM

## 2019-03-02 DIAGNOSIS — R0602 Shortness of breath: Secondary | ICD-10-CM | POA: Diagnosis not present

## 2019-03-02 DIAGNOSIS — R002 Palpitations: Secondary | ICD-10-CM

## 2019-03-02 NOTE — Patient Instructions (Addendum)
Medication Instructions:  No changes  If you need a refill on your cardiac medications before your next appointment, please call your pharmacy.    Lab work: No new labs needed   If you have labs (blood work) drawn today and your tests are completely normal, you will receive your results only by: Marland Kitchen MyChart Message (if you have MyChart) OR . A paper copy in the mail If you have any lab test that is abnormal or we need to change your treatment, we will call you to review the results.   Testing/Procedures: Echo for dilated aorta, ascending   Follow-Up: At [facility], you and your health needs are our priority.  As part of our continuing mission to provide you with exceptional heart care, we have created designated Provider Care Teams.  These Care Teams include your primary Cardiologist (physician) and Advanced Practice Providers (APPs -  Physician Assistants and Nurse Practitioners) who all work together to provide you with the care you need, when you need it.  . You will need a follow up appointment in 12 months .   Please call our office 2 months in advance to schedule this appointment.    . Providers on your designated Care Team:   . Murray Hodgkins, NP . Christell Faith, PA-C . Marrianne Mood, PA-C  Any Other Special Instructions Will Be Listed Below (If Applicable).  For educational health videos Log in to : [email or web address] Or : [email or web address], password : [email or web address]

## 2019-03-21 MED FILL — dilTIAZem HCL 60 MG TABS: 60 | 90 days supply | Qty: 270 | Fill #1

## 2019-03-21 MED FILL — PIOGLITAZONE HCL 30 MG TAB: 30 | 90 days supply | Qty: 90 | Fill #1

## 2019-03-21 MED FILL — BISOPROLOL-HCTZ 10-6.25 MG: 10-6.25 | 90 days supply | Qty: 90 | Fill #0

## 2019-04-01 ENCOUNTER — Other Ambulatory Visit: Payer: Self-pay

## 2019-04-01 ENCOUNTER — Ambulatory Visit (INDEPENDENT_AMBULATORY_CARE_PROVIDER_SITE_OTHER): Payer: No Typology Code available for payment source

## 2019-04-01 DIAGNOSIS — I77810 Thoracic aortic ectasia: Secondary | ICD-10-CM | POA: Diagnosis not present

## 2019-04-01 MED ORDER — PERFLUTREN LIPID MICROSPHERE
1.0000 mL | INTRAVENOUS | Status: AC | PRN
  Administered 2019-04-01: 2 mL via INTRAVENOUS

## 2019-04-04 ENCOUNTER — Telehealth: Payer: Self-pay | Admitting: *Deleted

## 2019-04-04 NOTE — Telephone Encounter (Signed)
Left voicemail message for patient to call back for results.  

## 2019-04-04 NOTE — Telephone Encounter (Signed)
-----   Message from Minna Merritts, MD sent at 04/02/2019  1:59 PM EDT ----- Echo Good cardiac function, Normal valves Aorta is unchanged at 4.2 cm, same as 07/2017

## 2019-04-05 NOTE — Telephone Encounter (Signed)
Reviewed results with patient in detail and he verbalized understanding with no further questions at this time.

## 2019-05-19 ENCOUNTER — Other Ambulatory Visit: Payer: Self-pay

## 2019-05-19 ENCOUNTER — Other Ambulatory Visit (HOSPITAL_COMMUNITY)
Admission: RE | Admit: 2019-05-19 | Discharge: 2019-05-19 | Disposition: A | Discharge status: Home / Self Care | Payer: No Typology Code available for payment source | Source: Ambulatory Visit | Attending: Internal Medicine | Admitting: Internal Medicine

## 2019-05-19 DIAGNOSIS — Z79899 Other long term (current) drug therapy: Secondary | ICD-10-CM | POA: Insufficient documentation

## 2019-05-19 DIAGNOSIS — E7800 Pure hypercholesterolemia, unspecified: Secondary | ICD-10-CM | POA: Insufficient documentation

## 2019-05-19 DIAGNOSIS — Z0184 Encounter for antibody response examination: Secondary | ICD-10-CM | POA: Insufficient documentation

## 2019-05-19 LAB — CBC WITH DIFFERENTIAL/PLATELET
Abs Immature Granulocytes: 0.03 10*3/uL (ref 0.00–0.07)
Basophils Absolute: 0 10*3/uL (ref 0.0–0.1)
Basophils Relative: 1 %
Eosinophils Absolute: 0.2 10*3/uL (ref 0.0–0.5)
Eosinophils Relative: 3 %
HCT: 46.9 % (ref 39.0–52.0)
Hemoglobin: 14.6 g/dL (ref 13.0–17.0)
Immature Granulocytes: 1 %
Lymphocytes Relative: 26 %
Lymphs Abs: 1.6 10*3/uL (ref 0.7–4.0)
MCH: 28 pg (ref 26.0–34.0)
MCHC: 31.1 g/dL (ref 30.0–36.0)
MCV: 89.8 fL (ref 80.0–100.0)
Monocytes Absolute: 0.6 10*3/uL (ref 0.1–1.0)
Monocytes Relative: 9 %
Neutro Abs: 3.8 10*3/uL (ref 1.7–7.7)
Neutrophils Relative %: 60 %
Platelets: 185 10*3/uL (ref 150–400)
RBC: 5.22 MIL/uL (ref 4.22–5.81)
RDW: 14.3 % (ref 11.5–15.5)
WBC: 6.2 10*3/uL (ref 4.0–10.5)
nRBC: 0 % (ref 0.0–0.2)

## 2019-05-19 LAB — COMPREHENSIVE METABOLIC PANEL
ALT: 123 U/L — ABNORMAL HIGH (ref 0–44)
AST: 85 U/L — ABNORMAL HIGH (ref 15–41)
Albumin: 3.9 g/dL (ref 3.5–5.0)
Alkaline Phosphatase: 48 U/L (ref 38–126)
Anion gap: 11 (ref 5–15)
BUN: 15 mg/dL (ref 6–20)
CO2: 24 mmol/L (ref 22–32)
Calcium: 8.9 mg/dL (ref 8.9–10.3)
Chloride: 103 mmol/L (ref 98–111)
Creatinine, Ser: 0.78 mg/dL (ref 0.61–1.24)
GFR calc Af Amer: >60 mL/min (ref >60)
GFR calc non Af Amer: >60 mL/min (ref >60)
Glucose, Bld: 143 mg/dL — ABNORMAL HIGH (ref 70–99)
Potassium: 4.1 mmol/L (ref 3.5–5.1)
Sodium: 138 mmol/L (ref 135–145)
Total Bilirubin: 0.6 mg/dL (ref 0.3–1.2)
Total Protein: 7.4 g/dL (ref 6.5–8.1)

## 2019-05-19 LAB — HEMOGLOBIN A1C
Hgb A1c MFr Bld: 7.2 % — ABNORMAL HIGH (ref 4.8–5.6)
Mean Plasma Glucose: 159.94 mg/dL

## 2019-05-19 LAB — LIPID PANEL
Cholesterol: 194 mg/dL (ref 0–200)
HDL: 28 mg/dL — ABNORMAL LOW (ref >40)
LDL Cholesterol: 121 mg/dL — ABNORMAL HIGH (ref 0–99)
Total CHOL/HDL Ratio: 6.9 RATIO
Triglycerides: 223 mg/dL — ABNORMAL HIGH (ref <150)
VLDL: 45 mg/dL — ABNORMAL HIGH (ref 0–40)

## 2019-05-19 LAB — TSH: TSH: 0.72 u[IU]/mL (ref 0.350–4.500)

## 2019-05-19 LAB — URINALYSIS, COMPLETE (UACMP) WITH MICROSCOPIC
Bacteria, UA: NONE SEEN
Bilirubin Urine: NEGATIVE
Glucose, UA: NEGATIVE mg/dL
Hgb urine dipstick: NEGATIVE
Ketones, ur: NEGATIVE mg/dL
Leukocytes,Ua: NEGATIVE
Nitrite: NEGATIVE
Protein, ur: NEGATIVE mg/dL
Specific Gravity, Urine: 1.018 (ref 1.005–1.030)
pH: 5 (ref 5.0–8.0)

## 2019-05-19 LAB — PSA: Prostatic Specific Antigen: 0.64 ng/mL (ref 0.00–4.00)

## 2019-05-20 LAB — MISC LABCORP TEST (SEND OUT): Labcorp test code: 164068

## 2019-05-24 ENCOUNTER — Other Ambulatory Visit: Payer: Self-pay | Admitting: Cardiovascular Disease

## 2019-06-20 ENCOUNTER — Other Ambulatory Visit: Payer: Self-pay | Admitting: *Deleted

## 2019-06-20 MED ORDER — DILTIAZEM HCL 60 MG PO TABS: 60.0000 mg | ORAL_TABLET | Freq: Three times a day (TID) | ORAL | 0 refills | Status: DC

## 2019-06-20 MED FILL — PIOGLITAZONE HCL 30 MG TABS: 30 | 90 days supply | Qty: 90 | Fill #2

## 2019-06-20 MED FILL — dilTIAZem HCL 60 MG TABS: 60 | 90 days supply | Qty: 270 | Fill #0

## 2019-07-04 MED FILL — BISOPROLOL-HCTZ 10-6.25 MG: 10-6.25 | 90 days supply | Qty: 90 | Fill #0

## 2019-08-01 ENCOUNTER — Other Ambulatory Visit: Payer: Self-pay | Admitting: Cardiovascular Disease

## 2019-08-01 MED FILL — FENOFIBRATE 145 MG TABLET: 145 | 90 days supply | Qty: 90 | Fill #0

## 2019-09-07 MED FILL — POTASSIUM CHLORIDE CRYS ER: 20 | 90 days supply | Qty: 180 | Fill #0

## 2019-09-19 MED FILL — PIOGLITAZONE HCL 30 MG TAB: 30 | 90 days supply | Qty: 90 | Fill #0

## 2019-09-20 MED FILL — metFORMIN HCL 850 MG TABS: 850 | 60 days supply | Qty: 120 | Fill #0

## 2019-10-05 MED FILL — BISOPROLOL-HCTZ 10-6.25 MG: 10-6.25 | 90 days supply | Qty: 90 | Fill #0

## 2019-11-01 ENCOUNTER — Other Ambulatory Visit (HOSPITAL_COMMUNITY): Payer: Self-pay | Admitting: General Surgery

## 2019-11-01 ENCOUNTER — Other Ambulatory Visit: Payer: Self-pay | Admitting: General Surgery

## 2019-11-01 MED FILL — FENOFIBRATE 145 MG TABS: 145 | 90 days supply | Qty: 90 | Fill #0

## 2019-11-08 ENCOUNTER — Ambulatory Visit (HOSPITAL_COMMUNITY)
Admission: RE | Admit: 2019-11-08 | Discharge: 2019-11-08 | Disposition: A | Discharge status: Home / Self Care | Payer: No Typology Code available for payment source | Source: Ambulatory Visit | Attending: General Surgery | Admitting: General Surgery

## 2019-11-08 ENCOUNTER — Ambulatory Visit (HOSPITAL_COMMUNITY): Payer: No Typology Code available for payment source

## 2019-11-08 ENCOUNTER — Encounter (HOSPITAL_COMMUNITY): Payer: Self-pay

## 2019-11-08 ENCOUNTER — Other Ambulatory Visit (HOSPITAL_COMMUNITY): Payer: Self-pay | Admitting: General Surgery

## 2019-11-08 ENCOUNTER — Other Ambulatory Visit: Payer: Self-pay

## 2019-11-17 ENCOUNTER — Other Ambulatory Visit: Payer: Self-pay

## 2019-11-17 ENCOUNTER — Other Ambulatory Visit (HOSPITAL_COMMUNITY)
Admission: RE | Admit: 2019-11-17 | Discharge: 2019-11-17 | Disposition: A | Discharge status: Home / Self Care | Payer: No Typology Code available for payment source | Source: Ambulatory Visit | Attending: General Surgery | Admitting: General Surgery

## 2019-11-17 ENCOUNTER — Other Ambulatory Visit (HOSPITAL_COMMUNITY)
Admission: RE | Admit: 2019-11-17 | Discharge: 2019-11-17 | Disposition: A | Discharge status: Home / Self Care | Payer: No Typology Code available for payment source | Source: Ambulatory Visit | Attending: Internal Medicine | Admitting: Internal Medicine

## 2019-11-17 DIAGNOSIS — G4730 Sleep apnea, unspecified: Secondary | ICD-10-CM | POA: Insufficient documentation

## 2019-11-17 DIAGNOSIS — E119 Type 2 diabetes mellitus without complications: Secondary | ICD-10-CM | POA: Insufficient documentation

## 2019-11-17 DIAGNOSIS — I10 Essential (primary) hypertension: Secondary | ICD-10-CM | POA: Insufficient documentation

## 2019-11-28 MED FILL — metFORMIN HCL 850 MG TABS: 850 | 60 days supply | Qty: 120 | Fill #0

## 2019-11-30 ENCOUNTER — Encounter: Payer: Self-pay | Admitting: Dietician

## 2019-11-30 ENCOUNTER — Encounter: Payer: No Typology Code available for payment source | Attending: General Surgery | Admitting: Dietician

## 2019-11-30 ENCOUNTER — Other Ambulatory Visit: Payer: Self-pay

## 2019-11-30 DIAGNOSIS — E669 Obesity, unspecified: Secondary | ICD-10-CM | POA: Insufficient documentation

## 2019-11-30 NOTE — Progress Notes (Signed)
Nutrition Assessment for Bariatric Surgery Medical Nutrition Therapy   Patient was seen on 11/30/2019 for Pre-Operative Nutrition Assessment. Letter of approval faxed to [facility] bariatric surgery program coordinator on 11/30/2019.   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 Typical meal pattern is 3 meals per day plus snacks. Avoids red meat, caffeine, chocolate, collard greens, butter beans. Drinks lots of fluids in each day. No structured exercise, but gets in walking at work and is looking forward to being more active. Interested in meal ideas.   24-Hr Dietary Recall First Meal: egg substitutes + sausage + cheese + english muffin  Snack: mixed peanuts  Second Meal: hamburger patty + lettuce/tomato/onion Snack: celery (or pickles)  Third Meal: spaghetti + ground [place] tomato sauce  Snack: sugar-free jello  Beverages: water, diet cranberry juice, unsweetened almond milk   NUTRITION DIAGNOSIS  Overweight/obesity ([place]-3.3) related to past poor dietary habits and physical inactivity as evidenced by patient w/ planned LapBand to RYGB surgery following dietary guidelines for continued weight loss.    NUTRITION INTERVENTION  Nutrition counseling (C-1) and education (E-2) to facilitate bariatric surgery goals.  Pre-Op Goals Reviewed with the Patient . Track food and beverage intake (pen and paper, MyFitness Pal, Baritastic app, etc.) . Make healthy food choices while monitoring portion sizes . Consume 3 meals per day or  try to eat every 3-5 hours . Avoid concentrated sugars and fried foods . Keep sugar & fat in the single digits per serving on food labels . Practice CHEWING your food (aim for applesauce consistency) . Practice not drinking 15 minutes before, during, and 30 minutes after each meal and snack . Avoid all carbonated beverages (ex: soda, sparkling beverages)  . Limit caffeinated beverages (ex: coffee, tea, energy drinks) . Avoid all sugar-sweetened beverages (ex: regular soda, sports drinks)  . Avoid alcohol  . Aim for 64-100 ounces of FLUID daily (with at least half of fluid intake being plain water)  . Aim for at least 60-80 grams of PROTEIN daily . Look for a liquid protein source that contains ?15 g protein and ?5 g carbohydrate (ex: shakes, drinks, shots) . Make a list of non-food related activities . Physical activity is an important part of a healthy lifestyle so keep it moving! The goal is to reach 150 minutes of exercise per week, including cardiovascular and weight baring activity.  Handouts Provided Include  . Bariatric Surgery handouts (Nutrition Visits, Pre-Op Goals, Protein Shakes, Vitamins & Minerals)  Learning Style & Readiness for Change Teaching method utilized: Visual & Auditory  Demonstrated degree of understanding via: Teach Back  Barriers to learning/adherence to lifestyle change: None Identified    MONITORING & EVALUATION Dietary intake, weekly physical activity, body weight, and pre-op goals reached at next nutrition visit.   Next Steps Patient is to follow up at [facility] for Pre-Op Class (>2 weeks before surgery) for further nutrition education.

## 2019-12-12 MED FILL — PIOGLITAZONE HCL 30 MG TAB: 30 | 90 days supply | Qty: 90 | Fill #1

## 2019-12-12 MED FILL — POTASSIUM CHLORIDE CRYS ER: 20 | 90 days supply | Qty: 180 | Fill #1

## 2020-01-11 MED FILL — BISOPROLOL-HCTZ 10-6.25 MG: 10-6.25 | 90 days supply | Qty: 90 | Fill #0

## 2020-01-25 ENCOUNTER — Other Ambulatory Visit: Payer: Self-pay

## 2020-01-25 MED ORDER — DILTIAZEM HCL 60 MG PO TABS: 60.0000 mg | ORAL_TABLET | Freq: Three times a day (TID) | ORAL | 0 refills | Status: DC

## 2020-01-25 MED FILL — dilTIAZem HCL 60 MG TABS: 60 | 90 days supply | Qty: 270 | Fill #0

## 2020-02-22 MED FILL — MOMETASONE FUROATE 0.1% SOL: 0.1 | 30 days supply | Qty: 60 | Fill #0

## 2020-03-05 ENCOUNTER — Ambulatory Visit (INDEPENDENT_AMBULATORY_CARE_PROVIDER_SITE_OTHER): Payer: No Typology Code available for payment source | Admitting: Physician Assistant

## 2020-03-05 ENCOUNTER — Other Ambulatory Visit: Payer: Self-pay

## 2020-03-05 ENCOUNTER — Encounter: Payer: Self-pay | Admitting: Physician Assistant

## 2020-03-05 VITALS — BP 140/94 | HR 74 | Ht 74.0 in | Wt 336.1 lb

## 2020-03-05 DIAGNOSIS — Z8249 Family history of ischemic heart disease and other diseases of the circulatory system: Secondary | ICD-10-CM | POA: Diagnosis not present

## 2020-03-05 DIAGNOSIS — E1165 Type 2 diabetes mellitus with hyperglycemia: Secondary | ICD-10-CM

## 2020-03-05 DIAGNOSIS — I10 Essential (primary) hypertension: Secondary | ICD-10-CM

## 2020-03-05 DIAGNOSIS — E785 Hyperlipidemia, unspecified: Secondary | ICD-10-CM

## 2020-03-05 DIAGNOSIS — R0789 Other chest pain: Secondary | ICD-10-CM

## 2020-03-05 DIAGNOSIS — G4733 Obstructive sleep apnea (adult) (pediatric): Secondary | ICD-10-CM

## 2020-03-05 DIAGNOSIS — I712 Thoracic aortic aneurysm, without rupture, unspecified: Secondary | ICD-10-CM

## 2020-03-05 DIAGNOSIS — Z6841 Body Mass Index (BMI) 40.0 and over, adult: Secondary | ICD-10-CM

## 2020-03-05 DIAGNOSIS — I5032 Chronic diastolic (congestive) heart failure: Secondary | ICD-10-CM

## 2020-03-05 DIAGNOSIS — I471 Supraventricular tachycardia: Secondary | ICD-10-CM

## 2020-03-05 DIAGNOSIS — I77810 Thoracic aortic ectasia: Secondary | ICD-10-CM

## 2020-03-05 DIAGNOSIS — Z87891 Personal history of nicotine dependence: Secondary | ICD-10-CM

## 2020-03-05 NOTE — Patient Instructions (Signed)
Medication Instructions:  Your physician recommends that you continue on your current medications as directed. Please refer to the Current Medication list given to you today.  *If you need a refill on your cardiac medications before your next appointment, please call your pharmacy*   Lab Work: None ordered If you have labs (blood work) drawn today and your tests are completely normal, you will receive your results only by: Marland Kitchen MyChart Message (if you have MyChart) OR . A paper copy in the mail If you have any lab test that is abnormal or we need to change your treatment, we will call you to review the results.   Testing/Procedures: None ordered   Follow-Up: At [facility], you and your health needs are our priority.  As part of our continuing mission to provide you with exceptional heart care, we have created designated Provider Care Teams.  These Care Teams include your primary Cardiologist (physician) and Advanced Practice Providers (APPs -  Physician Assistants and Nurse Practitioners) who all work together to provide you with the care you need, when you need it.  We recommend signing up for the patient portal called "MyChart".  Sign up information is provided on this After Visit Summary.  MyChart is used to connect with patients for Virtual Visits (Telemedicine).  Patients are able to view lab/test results, encounter notes, upcoming appointments, etc.  Non-urgent messages can be sent to your provider as well.   To learn more about what you can do with MyChart, go to [email or web address].    Your next appointment:   12 month(s)  The format for your next appointment:   In Person  Provider:    You may see Julien Nordmann, MD or one of the following Advanced Practice Providers on your designated Care Team:    Nicolasa Ducking, NP  Eula Listen, PA-C  Marisue Ivan, PA-C    Other Instructions Target BP 130/80 or less. Bring BP log to each office visit.  How to use a  home blood pressure monitor. . Be still. Don't smoke, drink caffeinated beverages or exercise within 30 minutes before measuring your blood pressure. . Sit correctly. Sit with your back straight and supported (on a dining chair, rather than a sofa). Your feet should be flat on the floor and your legs should not be crossed. Your arm should be supported on a flat surface (such as a table) with the upper arm at heart level. Make sure the bottom of the cuff is placed directly above the bend of the elbow.  . Measure at the same time every day. It's important to take the readings at the same time each day, such as morning and evening. Take reading approximately 1 hour after BP medications.

## 2020-03-05 NOTE — Progress Notes (Signed)
Office Visit    Patient Name: Hector Curry Date of Encounter: 03/05/2020  Primary Care Provider:  Marguarite Arbour, MD Primary Cardiologist:  Hector Nordmann, MD  Chief Complaint    Chief Complaint  Patient presents with  . office visit    12 month F/U; Meds verbally reviewed with patient.    59 year old male with family history of premature CAD, personal history of nonobstructive CAD, diastolic dysfunction, hypertension, hyperlipidemia, DM2, obesity s/p lap band, sleep apnea on BiPAP, dilated aortic root, previous history of smoking, and palpitations.  Past Medical History    Past Medical History:  Diagnosis Date  . Allergic rhinitis   . Coronary artery disease, non-occlusive    a. 05/08/2015 Cath: no significant CAD, LVEF nl-->Med; b. 07/2017 MV: attenuation artifact, no ischemia, EF 65%-->Low risk.  . Diabetes mellitus without complication (HCC)   . Diastolic dysfunction    a. 04/2015 Echo: EF 60-65%, mild MR, mildly dil LA, nl RV fxn, nl PASP; b. 05/2016 Echo: EF 60-65%, no rmwa, Gr1 DD, mildly dil Ao root and asc Ao; c. 07/2017 Echo: EF 55-60%, Ao root 24mm. mildly dil LA.  . Dilated aortic root (HCC)    a. 07/2017 Echo: 27mm Ao root - mildly dil.  . Diverticulosis 10/03/2014  . Family history of premature CAD    a. father passed from MI at 28  . GERD (gastroesophageal reflux disease)   . Hemorrhoids    a. internal hemorrhoids s/p surgery 1999  . History of tobacco abuse   . Hyperlipidemia   . Hyperplastic colon polyp 10/03/2014   a. x 2   . Hypertension   . Inflammatory arthritis    a. CCP antibodies & x-rays negative. Rheumatoid factor 14, felt to be crystaline over RA or psoriatic  . Kidney stones   . Morbid obesity (HCC)    a. s/p LAP-BAND  . OSA (obstructive sleep apnea)    a. on CPAP  . Osteoarthritis   . PSVT (paroxysmal supraventricular tachycardia) (HCC)    a. 48 hr Holter 04/2015: NSR w/ rare PVC, short runs of narrow complex tachycardiac, possible  atrial tach, longest run 7 beats, PACs noted (2% of all beats 3600 total) they did not seem to correlate w/ significant arrythmia; b. 08/2017 Event monitor: no significant arrhythmias.   Past Surgical History:  Procedure Laterality Date  . BARIATRIC SURGERY     lap band   . CARDIAC CATHETERIZATION N/A 05/08/2015   Procedure: Left Heart Cath and Coronary Angiography;  Surgeon: Corky Crafts, MD;  Location: [facility] INVASIVE CV LAB;  Service: Cardiovascular;  Laterality: N/A;  . CARPAL TUNNEL RELEASE Left   . CHOLECYSTECTOMY    . COLONOSCOPY  06/27/2005  . COLONOSCOPY  10/03/2014  . ESOPHAGOGASTRODUODENOSCOPY  06/27/2005  . HEMORRHOID SURGERY  1999  . TONSILLECTOMY      Allergies  No Known Allergies  History of Present Illness    Hector Curry is a 59 y.o. male with PMH as above.    He has a history of OSA on BiPAP. He used to follow with Hector Curry. He also has a history of DM2, managed by his PCP.   He has a strong family history of cardiac disease, including a father that passed away from MI in his late 64s.   He has a history of CP and nonobstructive CAD, as well as racing HR with episodes of SVT. He underwent stress test in 2015 as in CV studies (scanned). He had  catheterization 04/2015 that showed nonobstructive CAD.  Echo 2016 showed normal LV function, as did echo 05/2016.  Holter monitor in 2016 showed short runs of SVT / narrow complex tachycardia.  Stress testing 07/2017 was ruled low risk.  There was a moderate in size and severity fixed defect thought to most likely represent artifact but could not rule out an element of scar. No evidence of ischemia. He had a hypertensive response to exercise. It was noted that the sensitivity and specificity of the test was degraded by body habitus. He had subsequent 07/2017 echo with nl EF.  07/2017 event monitor was without significant arrhythmias.    He has been maintained on beta-blocker and short acting diltiazem. He reportedly does not  tolerate longer acting diltiazem.  He uses propanolol as needed for elevated heart rate and palpitations.  He was taking propanolol 3 times daily when seen 08/2017, and it was recommended he decrease this frequency and diltiazem titrated.  Most recent 03/2019 echo as below showed aortic root dilation, 85mm, and mild LAE.  Today, he returns to clinic and feels he is doing well from a cardiac standpoint.  He continues to note chest pain that occurs while sleeping and can wake him from sleep with early morning racing HR as well. He denies that his CP or racing HR has changed from that previously reported at prior visits. He denies any palpitations. This occurs 3-4 times per month and for which he takes his propanolol.  He is using his propanolol less often.  He has self- reduced the frequency of taking his diltiazem from taking it q8h to q12h. He reports that this reduced dosing is not due to the inconvenience of TID dosing, but rather that the elevated rates do not bother him during the day and only at night.He reports that this is as he does not notice his racing heart rate when he is working.  He indicated his preference to remain on twice daily dosing, as he feels the elevated rates do not hinder him and he more needs the medication at night to assist with sleep, and so that the elevated rates do not wake him. Discussed options to transition to a different medication or class of medications as below with patient preference to defer. During this time, also discussed was long acting diltiazem for more consistent coverage; however, he reports long acting diltiazem made him feel poorly in the past. He does not own a BP cuff. BP elevated today in clinic. He is uncertain if this is because he is between doses of diltiazem. He feels that his blood pressure is elevated today 2/2 recent trouble with getting preauthorization today.  He states that his BP is usually well controlled at other visits with previous clinic  visits reviewed and most recent PCP visit showing SBP 110. He does deny any presyncope or syncope.  No loss of consciousness.  No changes in breathing, including SOB/DOE.  He reports wearing his BiPAP consistently but does state that the needs to speak with his PCP regarding getting a pulmonology referral to ensure proper settings. He is not fatigued in the morning and denies early AM headache. He reports walking during work but otherwise no physical activity. He does admit to a level of deconditioning. 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   He denies any s/sx of bleeding. He denies any heart failure symptoms. He reports weight gain due to caloric intake and deconditioning, rather than  volume status. He does not weigh himself daily. LEE noted on today's exam with patient reporting that it does not trouble him. He does not feel volume overloaded and has not noted any abdominal distention, orthopnea, PND, or early satiety.   Home Medications    Prior to Admission medications   Medication Sig Start Date End Date Taking? Authorizing Provider  aspirin 81 MG tablet Take 81 mg by mouth daily.    [provider]  bisoprolol-hydrochlorothiazide [facility]) 10-6.25 MG tablet TAKE 1 TABLET BY MOUTH  DAILY 06/24/17   Antonieta Iba, MD  cholecalciferol (VITAMIN D) 1000 UNITS tablet Take 1,000 Units by mouth daily.     [provider]  cyanocobalamin ([facility] VITAMIN B12) 2000 MCG tablet Take 2,000 mcg by mouth daily.     [provider]  diltiazem (CARDIZEM) 60 MG tablet Take 1 tablet (60 mg total) by mouth every 8 (eight) hours. 01/25/20   Creig Hines, NP  fenofibrate (TRICOR) 145 MG tablet Take 145 mg by mouth daily.  01/09/17   [provider]  fluticasone (FLONASE) 50 MCG/ACT nasal spray Place 1 spray into both nostrils daily as needed for allergies or rhinitis.    [provider]  hydroxypropyl methylcellulose / hypromellose  (ISOPTO TEARS / GONIOVISC) 2.5 % ophthalmic solution Place 1 drop into both eyes as needed for dry eyes.    [provider]  Magnesium Oxide 400 (240 Mg) MG TABS Take 1 tablet (400 mg total) by mouth daily. Patient taking differently: Take 1 tablet by mouth 2 (two) times a day.  07/30/17   Dunn, Raymon Mutton, PA-C  metFORMIN (GLUCOPHAGE) 850 MG tablet Take 1 tablet (850 mg total) by mouth 2 (two) times daily with a meal. Do not start before 16th of this month 05/11/15   Leroy Sea, MD  pioglitazone (ACTOS) 30 MG tablet Take 1 tablet (30 mg total) by mouth daily. 02/23/18   Antonieta Iba, MD  potassium chloride SA (KLOR-CON) 20 MEQ tablet TAKE 1 TABLET BY MOUTH TWO TIMES DAILY 08/01/19   Antonieta Iba, MD  propranolol (INDERAL) 20 MG tablet TAKE 1 TABLET BY MOUTH 3  TIMES DAILY AS NEEDED 06/02/17   Antonieta Iba, MD    Review of Systems    He denies  palpitations, dyspnea, pnd, orthopnea, n, v, dizziness, syncope, edema, or early satiety.  He continues to note night racing heart rate and atypical chest pain while he is sleeping, unchanged from previous visits.  He reached reports weight gain that he attributes to caloric intake.  He does notive his lower extremity edema on today's exam - it does not bother him.   All other systems reviewed and are otherwise negative except as noted above.  Physical Exam    VS:  BP (!) 140/94 (BP Location: Left Arm, Patient Position: Sitting, Cuff Size: Large)   Pulse 74   Ht  (1.88 m)   Wt (!) 336 lb 2 oz (152.5 kg)   SpO2 96%   BMI 43.16 kg/m  , BMI Body mass index is 43.16 kg/m. GEN: Well nourished, well developed, in no acute distress. HEENT: normal. Neck: Supple, no JVD, carotid bruits, or masses. Cardiac: RRR, no murmurs, rubs, or gallops. No clubbing, cyanosis, moderate to 1+ bilateral LEE.  Radials/DP/PT 2+ and equal bilaterally.  Respiratory:  Respirations regular and unlabored, clear to auscultation bilaterally. GI: Soft,  nontender, nondistended, BS + x 4. MS: no deformity or atrophy. Skin: warm and  dry, no rash. Neuro:  Strength and sensation are intact. Psych: Normal affect.  Accessory Clinical Findings    ECG personally reviewed by me today - NSR, 74bpm - no acute changes.  VITALS Reviewed today   Temp Readings from Last 3 Encounters:  02/26/19 98.3 F (36.8 C) (Oral)  07/28/17 98.9 F (37.2 C) (Oral)  05/13/17 98.4 F (36.9 C)   BP Readings from Last 3 Encounters:  03/05/20 (!) 140/94  03/02/19 140/76  02/26/19 131/65   Pulse Readings from Last 3 Encounters:  03/05/20 74  03/02/19 75  02/26/19 63    Wt Readings from Last 3 Encounters:  03/05/20 (!) 336 lb 2 oz (152.5 kg)  11/30/19 (!) 328 lb 11.2 oz (149.1 kg)  03/02/19 (!) 328 lb (148.8 kg)     LABS  reviewed today   Lab Results  Component Value Date   WBC 6.2 05/19/2019   HGB 14.6 05/19/2019   HCT 46.9 05/19/2019   MCV 89.8 05/19/2019   PLT 185 05/19/2019   Lab Results  Component Value Date   CREATININE 0.78 05/19/2019   BUN 15 05/19/2019   NA 138 05/19/2019   K 4.1 05/19/2019   CL 103 05/19/2019   CO2 24 05/19/2019   Lab Results  Component Value Date   ALT 123 (H) 05/19/2019   AST 85 (H) 05/19/2019   ALKPHOS 48 05/19/2019   BILITOT 0.6 05/19/2019   Lab Results  Component Value Date   CHOL 194 05/19/2019   HDL 28 (L) 05/19/2019   LDLCALC 121 (H) 05/19/2019   TRIG 223 (H) 05/19/2019   CHOLHDL 6.9 05/19/2019    Lab Results  Component Value Date   HGBA1C 7.2 (H) 05/19/2019   Lab Results  Component Value Date   TSH 0.720 05/19/2019     STUDIES/PROCEDURES reviewed today   Echo 04/01/2019 1. The left ventricle has normal systolic function, with an ejection  fraction of 55-60%. The cavity size was normal. Left ventricular diastolic  parameters were normal.  2. The right ventricle has normal systolic function. The cavity was  normal. There is no increase in right ventricular wall thickness. Right    ventricular systolic pressure could not be assessed.  3. Left atrial size was mildly dilated.  4. The mitral valve is grossly normal.  5. The tricuspid valve is grossly normal.  6. The aortic valve is grossly normal. No stenosis of the aortic valve.  7. The aorta is abnormal in size and structure.  8. There is mild to moderate dilatation of the aortic root and of the  ascending aorta measuring 42 mm.   Stress test 07/2017  Low risk, probably normal exercise myocardial perfusion stress test.  There is a moderate in size, moderate in severity, fixed defect involving the apical anterior, apical septal, and apical segments. This most likely represents artifact (attenuation and RV insertion) but cannot rule out an element of scar.  No evidence of ischemia.  The left ventricular ejection fraction is normal (65%) with normal wall motion.  Horizontal ST segment depression ST segment depression of 1 mm was noted during stress in the III and aVF leads.  Hypertensive blood pressure response to exercise.  The sensitivity and specificity of the study are degraded by the patient's body habitus (BMI 41).  Cardiac Monitoring 07/2017 Event Monitor Normal sinus rhythm No significant arrhythmia noted Signed, Dossie Arbour, MD, Ph.D [facility] 04/2015  The left ventricular systolic function is normal.  No significant CAD. Continue  preventive therapy and risk factor modification.   Assessment & Plan    Chest pain  NSVT / Racing HR  Hypertension  OSA on BiPAP  Family history of premature CAD --Continues to note mild chest discomfort and tachycardic rates that wake him from sleep at least 3-4 times per month. Unchanged from baseline. Previous 2016 cath was without obstructive CAD. Nonischemic /low risk and probable normal stress test 07/2017 as above. Previous 2018 monitor without arrhythmia. Most recent echo 03/2019 with nl LVEF, aortic root dilation, and mild LAE. He is due  for updated echo in 03/2020 for aortic root dilation.    --Suspect CP is multifactorial and in the setting of suspected ongoing breakthrough tachycardia / PSVT, suspected breakthrough/ elevated pressures, and likely ongoing stress of severe sleep apnea. Also considered are comorbid conditions and lifestyle influences, including current life stressors, weight, diet, and deconditioning. Family history of premature CAD and current LDL and A1C also considered as well.  --Current medications include Bisoprolol-HCTZ, diltiazem, and PRN propanolol. He reports having episodes at least 3-4 times per month and using his break through propanolol for thee episodes. He is using his propanolol less often. He self- reduced the frequency of taking his diltiazem from taking it q8h to q12h. He reports that this reduced dosing is not due to the inconvenience of TID dosing. He states his elevated rates do not bother him during the day and only at night. Discussed today that breakthrough elevated rates and BP places stress on his heart and aortic root dilation and could ultimately result in worsening dilation and/or tachycardia induced CM.  --Recommend q8h dosing of diltiazem. Discussed the antianginal effects of diltiazem and that more frequent dosing or a longer acting formulation could also result in anginal relief. He did not tolerate the longer acting diltiazem, however. Future considerations could include Imdur or Ranexa. --Recommended he start to track his vitals and log them going forward. Consider purchasing a BP cuff.  --Discussed speaking with his PCP regarding pulmonary referral to ensure correct BiPAP settings, as this could contribute to CP as well.  --Due for his repeat echocardiogram in August 2021 to reassess his aortic root dilation, at which time we can also reassess his EF and heart chamber size and pressures. In terms of his aortic root dilation, he should avoid FQ and heavy lifting. Rate and BP control  recommended, as well as cholesterol and glycemic control.   Uncontrolled BP --As above. Sub-optimal BP today. He attributes it to stressors today. Suspect elevated pressures likely due to q12h dosing of diltiazem, as well as stressors and other lifestyle influences as above. Politely declined medication changes as outlined above, including long acting diltiazem, as this made him feel poorly in the past.  Recommended diltiazem q8h dosing of short acting diltiazem. Recommend purchasing a BP cuff and monitoring at home. Consider escalating his HCTZ or transition from HCTZ to low dose lasix given his LEE as outlined below and as suspect he is holding on to some fluid today with updated echo for 03/2020 still  Pending. Given his DM2, and per GDMT, future ACE/ARB recommended for BP support. BP and HR control strongly recommended moving forward and given Ao root dilation and family history of CAD with his current comorbid risk factors. Low salt diet and risk factor modification recommended. Reassess the above medication changes and his BP/HR log at RTC or next PCP appointment.  LEE --Significant edema noted on today's exam, which patient states is not bothersome for him. Recommend  compression stockings and leg elevation. Could consider trial off of CCB to see if this assists in reducing LEE. Also recommend low salt diet, fluid restriction, and daily weights. Given his body habitus, it is difficult to assess his volume status; however, I suspect he is holding on to some fluid. Recommend that he update his echo as above to reassess heart pressures. Discussed that elevated volume status would contribute to his elevated rates, pressure, LEE, and CP. He has politely declined medication changes today. Future medication changes as above include escalating HCTZ or transition from HCTZ to low dose lasix. Volume control recommended, given comorbid conditions as outlined here.   OSA on BiPAP --As above. Compliant with BiPAP.  Do recommend his speaking with his PCP regarding pulmonology referral to ensure accurate BiPAP settings and OSA tx goinig forward. Ongoing BiPAP compliance encouraged.  Hyperlipidemia --Statin recommended given family history of premature CAD and aortic root dilation, as well as comorbid DM2.   Uncontrolled DM2 --Glycemic control recommended for risk factor modification.  Lifestyle changes discussed, including increasing weight loss, diet, physical activity. Also recommend statin therapy, especially in light of his aortic root dilation. Will defer to primary care.    Aortic root dilation --Update echo as above. Due for update 03/2020.  BMI 40-45 --Lifestyle changes recommended.   Medication changes: Politely declined those outlined above. Reassess at RTC. Labs ordered: None. Defer to PCP. Studies / Imaging ordered: Upcoming annual echo 03/2020 for aortic root dilation Future considerations: Echo as above. Mediation changes outlined above, including statin, ARB, and increased diuretic.  Disposition: RTC 1 year  Total time spent with patient today 45 minutes. This includes reviewing records, evaluating the patient, and coordinating care. Face-to-face time >50%.    Lennon AlstromJacquelyn D Anaclara Acklin, PA-C 03/05/2020

## 2020-03-11 MED FILL — PIOGLITAZONE HCL 30 MG TAB: 30 | 90 days supply | Qty: 90 | Fill #2

## 2020-03-14 ENCOUNTER — Ambulatory Visit (INDEPENDENT_AMBULATORY_CARE_PROVIDER_SITE_OTHER): Payer: No Typology Code available for payment source | Admitting: Psychology

## 2020-03-14 ENCOUNTER — Ambulatory Visit: Payer: No Typology Code available for payment source | Admitting: Psychology

## 2020-03-14 DIAGNOSIS — F509 Eating disorder, unspecified: Secondary | ICD-10-CM

## 2020-03-28 ENCOUNTER — Other Ambulatory Visit (HOSPITAL_COMMUNITY): Payer: Self-pay | Admitting: Internal Medicine

## 2020-03-28 ENCOUNTER — Ambulatory Visit: Payer: No Typology Code available for payment source | Admitting: Psychology

## 2020-03-28 ENCOUNTER — Other Ambulatory Visit: Payer: Self-pay | Admitting: Cardiovascular Disease

## 2020-03-28 ENCOUNTER — Ambulatory Visit (INDEPENDENT_AMBULATORY_CARE_PROVIDER_SITE_OTHER): Payer: No Typology Code available for payment source | Admitting: Psychology

## 2020-03-28 DIAGNOSIS — F509 Eating disorder, unspecified: Secondary | ICD-10-CM

## 2020-03-28 MED FILL — METFORMIN HCL 850 MG TABS: 850 | 60 days supply | Qty: 120 | Fill #0

## 2020-03-28 MED FILL — POTASSIUM CHLORIDE CRYS ER: 20 | 90 days supply | Qty: 180 | Fill #0

## 2020-04-12 MED FILL — BISOPROLOL-HYDROCHLOROTHIAZ: 10-6.25 | 90 days supply | Qty: 90 | Fill #0

## 2020-05-10 MED FILL — FENOFIBRATE 145 MG TABS: 145 | 90 days supply | Qty: 90 | Fill #0

## 2020-05-22 ENCOUNTER — Other Ambulatory Visit (HOSPITAL_COMMUNITY)
Admission: RE | Admit: 2020-05-22 | Discharge: 2020-05-22 | Disposition: A | Discharge status: Home / Self Care | Payer: No Typology Code available for payment source | Source: Ambulatory Visit | Attending: Internal Medicine | Admitting: Internal Medicine

## 2020-05-22 DIAGNOSIS — Z79899 Other long term (current) drug therapy: Secondary | ICD-10-CM | POA: Insufficient documentation

## 2020-05-22 DIAGNOSIS — E7800 Pure hypercholesterolemia, unspecified: Secondary | ICD-10-CM | POA: Insufficient documentation

## 2020-05-22 DIAGNOSIS — E118 Type 2 diabetes mellitus with unspecified complications: Secondary | ICD-10-CM | POA: Insufficient documentation

## 2020-05-22 LAB — COMPREHENSIVE METABOLIC PANEL
ALT: 98 U/L — ABNORMAL HIGH (ref 0–44)
AST: 68 U/L — ABNORMAL HIGH (ref 15–41)
Albumin: 4 g/dL (ref 3.5–5.0)
Alkaline Phosphatase: 53 U/L (ref 38–126)
Anion gap: 11 (ref 5–15)
BUN: 14 mg/dL (ref 6–20)
CO2: 26 mmol/L (ref 22–32)
Calcium: 9.1 mg/dL (ref 8.9–10.3)
Chloride: 98 mmol/L (ref 98–111)
Creatinine, Ser: 0.8 mg/dL (ref 0.61–1.24)
GFR calc Af Amer: >60 mL/min (ref >60)
GFR calc non Af Amer: >60 mL/min (ref >60)
Glucose, Bld: 181 mg/dL — ABNORMAL HIGH (ref 70–99)
Potassium: 3.9 mmol/L (ref 3.5–5.1)
Sodium: 135 mmol/L (ref 135–145)
Total Bilirubin: 0.9 mg/dL (ref 0.3–1.2)
Total Protein: 7.8 g/dL (ref 6.5–8.1)

## 2020-05-22 LAB — CBC WITH DIFFERENTIAL/PLATELET
Abs Immature Granulocytes: 0.05 10*3/uL (ref 0.00–0.07)
Basophils Absolute: 0.1 10*3/uL (ref 0.0–0.1)
Basophils Relative: 1 %
Eosinophils Absolute: 0.2 10*3/uL (ref 0.0–0.5)
Eosinophils Relative: 3 %
HCT: 47.3 % (ref 39.0–52.0)
Hemoglobin: 15.1 g/dL (ref 13.0–17.0)
Immature Granulocytes: 1 %
Lymphocytes Relative: 25 %
Lymphs Abs: 1.7 10*3/uL (ref 0.7–4.0)
MCH: 29.6 pg (ref 26.0–34.0)
MCHC: 31.9 g/dL (ref 30.0–36.0)
MCV: 92.7 fL (ref 80.0–100.0)
Monocytes Absolute: 0.6 10*3/uL (ref 0.1–1.0)
Monocytes Relative: 8 %
Neutro Abs: 4.3 10*3/uL (ref 1.7–7.7)
Neutrophils Relative %: 62 %
Platelets: 190 10*3/uL (ref 150–400)
RBC: 5.1 MIL/uL (ref 4.22–5.81)
RDW: 13.7 % (ref 11.5–15.5)
WBC: 7 10*3/uL (ref 4.0–10.5)
nRBC: 0 % (ref 0.0–0.2)

## 2020-05-22 LAB — LIPID PANEL
Cholesterol: 221 mg/dL — ABNORMAL HIGH (ref 0–200)
HDL: 30 mg/dL — ABNORMAL LOW (ref >40)
LDL Cholesterol: 128 mg/dL — ABNORMAL HIGH (ref 0–99)
Total CHOL/HDL Ratio: 7.4 RATIO
Triglycerides: 316 mg/dL — ABNORMAL HIGH (ref <150)
VLDL: 63 mg/dL — ABNORMAL HIGH (ref 0–40)

## 2020-05-22 LAB — HEMOGLOBIN A1C
Hgb A1c MFr Bld: 7.8 % — ABNORMAL HIGH (ref 4.8–5.6)
Mean Plasma Glucose: 177.16 mg/dL

## 2020-05-22 LAB — TSH: TSH: 1.018 u[IU]/mL (ref 0.350–4.500)

## 2020-05-23 LAB — URINALYSIS, ROUTINE W REFLEX MICROSCOPIC
Bilirubin Urine: NEGATIVE
Glucose, UA: 250 mg/dL — AB
Hgb urine dipstick: NEGATIVE
Ketones, ur: NEGATIVE mg/dL
Leukocytes,Ua: NEGATIVE
Nitrite: NEGATIVE
Protein, ur: NEGATIVE mg/dL
Specific Gravity, Urine: 1.025 (ref 1.005–1.030)
pH: 6 (ref 5.0–8.0)

## 2020-06-09 ENCOUNTER — Other Ambulatory Visit (HOSPITAL_COMMUNITY): Payer: Self-pay | Admitting: Internal Medicine

## 2020-06-11 MED FILL — PIOGLITAZONE HCL 30 MG TABS: 30 | 90 days supply | Qty: 90 | Fill #0

## 2020-06-13 ENCOUNTER — Other Ambulatory Visit (HOSPITAL_COMMUNITY): Payer: Self-pay | Admitting: Internal Medicine

## 2020-06-14 MED FILL — METFORMIN HCL 850 MG TABS: 850 | 90 days supply | Qty: 180 | Fill #0

## 2020-07-04 MED FILL — POTASSIUM CHLORIDE CRYS ER: 20 | 90 days supply | Qty: 180 | Fill #1

## 2020-07-05 ENCOUNTER — Other Ambulatory Visit: Payer: Self-pay

## 2020-07-05 ENCOUNTER — Other Ambulatory Visit: Payer: Self-pay | Admitting: Nurse Practitioner

## 2020-07-05 MED ORDER — DILTIAZEM HCL 60 MG PO TABS
60.0000 mg | ORAL_TABLET | Freq: Three times a day (TID) | ORAL | 0 refills | Status: DC
Start: 2020-07-05 — End: 2020-07-05

## 2020-07-05 MED FILL — dilTIAZem HCL 60 MG TABS: 60 | 90 days supply | Qty: 270 | Fill #0

## 2020-07-13 MED FILL — BISOPROLOL-HYDROCHLOROTHIAZ: 10-6.25 | 90 days supply | Qty: 90 | Fill #0

## 2020-08-06 MED FILL — FENOFIBRATE 145 MG TABS: 145 | 90 days supply | Qty: 90 | Fill #0

## 2020-09-03 MED FILL — PIOGLITAZONE HCL 30 MG TABS: 30 | 90 days supply | Qty: 90 | Fill #1

## 2020-09-11 MED FILL — METFORMIN HCL 850 MG TABS: 850 | 90 days supply | Qty: 180 | Fill #1

## 2020-10-04 MED FILL — POTASSIUM CHLORIDE CRYS ER: 20 | 90 days supply | Qty: 180 | Fill #2

## 2020-10-18 ENCOUNTER — Other Ambulatory Visit (HOSPITAL_COMMUNITY): Payer: Self-pay | Admitting: Internal Medicine

## 2020-10-19 MED FILL — BISOPROLOL-HYDROCHLOROTHIAZ: 10-6.25 | 90 days supply | Qty: 90 | Fill #0

## 2020-11-13 ENCOUNTER — Other Ambulatory Visit (HOSPITAL_COMMUNITY): Payer: Self-pay | Admitting: Internal Medicine

## 2020-11-14 ENCOUNTER — Other Ambulatory Visit (HOSPITAL_COMMUNITY): Payer: Self-pay | Admitting: Internal Medicine

## 2020-11-14 ENCOUNTER — Other Ambulatory Visit (HOSPITAL_BASED_OUTPATIENT_CLINIC_OR_DEPARTMENT_OTHER): Payer: Self-pay

## 2020-11-14 MED FILL — FENOFIBRATE 145 MG TABS: 145 | 90 days supply | Qty: 90 | Fill #0

## 2020-11-23 ENCOUNTER — Other Ambulatory Visit: Payer: Self-pay | Admitting: General Surgery

## 2020-11-28 ENCOUNTER — Telehealth: Payer: Self-pay | Admitting: Cardiovascular Disease

## 2020-11-28 NOTE — Telephone Encounter (Signed)
LMOV to schedule  Per Dr Judithann Sheen for hypercholesterolemia

## 2020-12-06 ENCOUNTER — Ambulatory Visit (INDEPENDENT_AMBULATORY_CARE_PROVIDER_SITE_OTHER): Payer: No Typology Code available for payment source | Admitting: Nurse Practitioner

## 2020-12-06 ENCOUNTER — Other Ambulatory Visit: Payer: Self-pay

## 2020-12-06 ENCOUNTER — Other Ambulatory Visit: Payer: Self-pay | Admitting: General Surgery

## 2020-12-06 ENCOUNTER — Encounter: Payer: Self-pay | Admitting: Nurse Practitioner

## 2020-12-06 ENCOUNTER — Ambulatory Visit
Admission: RE | Admit: 2020-12-06 | Discharge: 2020-12-06 | Disposition: A | Discharge status: Home / Self Care | Payer: No Typology Code available for payment source | Source: Ambulatory Visit | Attending: General Surgery | Admitting: General Surgery

## 2020-12-06 ENCOUNTER — Telehealth: Payer: Self-pay | Admitting: Nurse Practitioner

## 2020-12-06 VITALS — BP 138/72 | HR 76 | Ht 74.0 in | Wt 323.4 lb

## 2020-12-06 DIAGNOSIS — E782 Mixed hyperlipidemia: Secondary | ICD-10-CM

## 2020-12-06 DIAGNOSIS — R0789 Other chest pain: Secondary | ICD-10-CM

## 2020-12-06 DIAGNOSIS — I471 Supraventricular tachycardia: Secondary | ICD-10-CM

## 2020-12-06 DIAGNOSIS — I77810 Thoracic aortic ectasia: Secondary | ICD-10-CM | POA: Diagnosis not present

## 2020-12-06 DIAGNOSIS — R002 Palpitations: Secondary | ICD-10-CM

## 2020-12-06 DIAGNOSIS — I5032 Chronic diastolic (congestive) heart failure: Secondary | ICD-10-CM

## 2020-12-06 DIAGNOSIS — Z01810 Encounter for preprocedural cardiovascular examination: Secondary | ICD-10-CM | POA: Diagnosis not present

## 2020-12-06 NOTE — Telephone Encounter (Signed)
Left voicemail message for patient to call back so that we can schedule an echocardiogram that is due. Will route to scheduling and if he has questions let me know.

## 2020-12-06 NOTE — Patient Instructions (Signed)
Medication Instructions:  No changes  *If you need a refill on your cardiac medications before your next appointment, please call your pharmacy*   Lab Work: None  If you have labs (blood work) drawn today and your tests are completely normal, you will receive your results only by: . MyChart Message (if you have MyChart) OR . A paper copy in the mail If you have any lab test that is abnormal or we need to change your treatment, we will call you to review the results.   Testing/Procedures: None    Follow-Up: At [facility], you and your health needs are our priority.  As part of our continuing mission to provide you with exceptional heart care, we have created designated Provider Care Teams.  These Care Teams include your primary Cardiologist (physician) and Advanced Practice Providers (APPs -  Physician Assistants and Nurse Practitioners) who all work together to provide you with the care you need, when you need it.   Your next appointment:   1 year(s)  The format for your next appointment:   In Person  Provider:   Timothy Gollan, MD  

## 2020-12-06 NOTE — Telephone Encounter (Signed)
Provider sent secure message with request to call and schedule echo.    Hector Curry is overdue for a f/u echo in the setting of dilated aortic root. we didn't discuss today, as it was only a preop visit. I'll put in order if you wouldn't mind letting him know and getting him scheduled. thanks.

## 2020-12-06 NOTE — Progress Notes (Signed)
Office Visit    Patient Name: Hector Curry Date of Encounter: 12/06/2020  Primary Care Provider:  Marguarite Arbour, MD Primary Cardiologist:  Julien Nordmann, MD  Chief Complaint    60 year old male with a history of nonobstructive CAD, diastolic dysfunction, dilated aortic root, palpitations/PSVT, hypertension, hyperlipidemia, obesity status post lap band, sleep apnea on BiPAP, and family history of premature CAD, who presents for follow-up related to preoperative cardiac evaluation pending gastric bypass.  Past Medical History    Past Medical History:  Diagnosis Date  . Allergic rhinitis   . Coronary artery disease, non-occlusive    a. 05/08/2015 Cath: no significant CAD, LVEF nl-->Med; b. 07/2017 MV: attenuation artifact, no ischemia, EF 65%-->Low risk.  . Diabetes mellitus without complication (HCC)   . Diastolic dysfunction    a. 04/2015 Echo: EF 60-65%, mild MR, mildly dil LA, nl RV fxn, nl PASP; b. 05/2016 Echo: EF 60-65%, no rmwa, Gr1 DD, mildly dil Ao root and asc Ao; c. 07/2017 Echo: EF 55-60%, Ao root 45mm. mildly dil LA; d. 03/2019 Echo: EF 55-60%, nl RV fxn. Mild LAE, Ao root/Asc Ao 8mm.  . Dilated aortic root (HCC)    a. 07/2017 Echo: 81mm Ao root - mildly dil; b. 03/2019 Echo: Ao root 68mm.  . Diverticulosis 10/03/2014  . Family history of premature CAD    a. father passed from MI at 54  . GERD (gastroesophageal reflux disease)   . Hemorrhoids    a. internal hemorrhoids s/p surgery 1999  . History of tobacco abuse   . Hyperlipidemia   . Hyperplastic colon polyp 10/03/2014   a. x 2   . Hypertension   . Inflammatory arthritis    a. CCP antibodies & x-rays negative. Rheumatoid factor 14, felt to be crystaline over RA or psoriatic  . Kidney stones   . Morbid obesity (HCC)    a. s/p LAP-BAND  . OSA (obstructive sleep apnea)    a. on CPAP  . Osteoarthritis   . PSVT (paroxysmal supraventricular tachycardia) (HCC)    a. 48 hr Holter 04/2015: NSR w/ rare PVC, short  runs of narrow complex tachycardiac, possible atrial tach, longest run 7 beats, PACs noted (2% of all beats 3600 total) they did not seem to correlate w/ significant arrythmia; b. 08/2017 Event monitor: no significant arrhythmias.   Past Surgical History:  Procedure Laterality Date  . BARIATRIC SURGERY     lap band   . CARDIAC CATHETERIZATION N/A 05/08/2015   Procedure: Left Heart Cath and Coronary Angiography;  Surgeon: Corky Crafts, MD;  Location[facility] INVASIVE CV LAB;  Service: Cardiovascular;  Laterality: N/A;  . CARPAL TUNNEL RELEASE Left   . CHOLECYSTECTOMY    . COLONOSCOPY  06/27/2005  . COLONOSCOPY  10/03/2014  . ESOPHAGOGASTRODUODENOSCOPY  06/27/2005  . HEMORRHOID SURGERY  1999  . TONSILLECTOMY      Allergies  No Known Allergies  History of Present Illness    60 year old male with above complex past medical history including family history of premature CAD, personal history of nonobstructive CAD, diastolic dysfunction, hypertension, hyperlipidemia, obesity, sleep apnea, dilated aortic root, and palpitations.  Previous cardiac evaluation includes a catheterization in September 2016 revealing no significant CAD.  Stress testing was performed in December 2018 and was low risk without evidence of ischemia.  Regarding history of palpitations, he wore a Holter monitor in 2016 which showed short runs of narrow complex tachycardia.  Palpitations have been managed with beta-blocker and calcium channel blocker therapy.  No significant arrhythmias were noted on Holter monitoring in 08/2017.  Most recent echo in August 2020 showed normal LV function with stable aortic root size at 42 mm.  Hector Curry was last seen in clinic in July 2021, at which time he reported intermittent, mild chest discomfort associated with elevation in heart rates occurring 3-4 times per month.  Diltiazem was changed to every 8 hour dosing.  He has since been doing well and notes blood pressures typically running less  than 130 mmHg at home.  He has been walking up to a mile a day and also walks frequently at work.  He is able to walk up 4 flights of stairs without difficulty.  He is currently being evaluated for gastric bypass in [place].  He denies chest pain, dyspnea, palpitations, PND, orthopnea, dizziness, syncope, edema, or early satiety.  Home Medications    Prior to Admission medications   Medication Sig Start Date End Date Taking? Authorizing Provider  aspirin 81 MG tablet Take 81 mg by mouth daily.   Yes [provider]  bisoprolol-hydrochlorothiazide [facility]) 10-6.25 MG tablet TAKE 1 TABLET BY MOUTH ONCE DAILY 11/13/20 11/13/21 Yes Marguarite Arbour, MD  cholecalciferol (VITAMIN D) 1000 UNITS tablet Take 1,000 Units by mouth daily.    Yes [provider]  cyanocobalamin 2000 MCG tablet Take 2,000 mcg by mouth daily.    Yes [provider]  diltiazem (CARDIZEM) 60 MG tablet TAKE 1 TABLET BY MOUTH EVERY 8 HOURS 07/05/20 07/05/21 Yes Creig Hines, NP  fenofibrate (TRICOR) 145 MG tablet Take 145 mg by mouth daily.  01/09/17  Yes [provider]  fluticasone (FLONASE) 50 MCG/ACT nasal spray Place 1 spray into both nostrils daily as needed for allergies or rhinitis.   Yes [provider]  hydroxypropyl methylcellulose / hypromellose (ISOPTO TEARS / GONIOVISC) 2.5 % ophthalmic solution Place 1 drop into both eyes as needed for dry eyes.   Yes [provider]  Magnesium Oxide 400 (240 Mg) MG TABS Take 1 tablet (400 mg total) by mouth daily. 07/30/17  Yes Dunn, Raymon Mutton, PA-C  metFORMIN (GLUCOPHAGE) 850 MG tablet Take 1 tablet (850 mg total) by mouth 2 (two) times daily with a meal. Do not start before 16th of this month 05/11/15  Yes Leroy Sea, MD  pioglitazone (ACTOS) 30 MG tablet Take 1 tablet (30 mg total) by mouth daily. 02/23/18  Yes Gollan, Tollie Pizza, MD  potassium chloride SA (KLOR-CON) 20 MEQ tablet TAKE 1 TABLET BY MOUTH TWO TIMES DAILY  03/28/20 03/28/21 Yes Gollan, Tollie Pizza, MD  propranolol (INDERAL) 20 MG tablet TAKE 1 TABLET BY MOUTH 3  TIMES DAILY AS NEEDED 06/02/17  Yes Gollan, Tollie Pizza, MD  QUERCETIN PO Take by mouth daily at 8 pm.   Yes [provider]   Review of Systems    He denies chest pain, palpitations, dyspnea, pnd, orthopnea, n, v, dizziness, syncope, edema, weight gain, or early satiety.  All other systems reviewed and are otherwise negative except as noted above.  Physical Exam    VS:  BP 138/72 (BP Location: Left Arm, Patient Position: Sitting, Cuff Size: Normal)   Pulse 76   Ht 6\' 2"  (1.88 m)   Wt (!) 323 lb 6 oz (146.7 kg)   SpO2 97%   BMI 41.52 kg/m  , BMI Body mass index is 41.52 kg/m. GEN: Obese, in no acute distress. HEENT: normal. Neck: Supple, no JVD, carotid bruits, or masses. Cardiac: RRR, no  murmurs, rubs, or gallops. No clubbing, cyanosis, trace bilateral ankle edema.  Radials/PT 2+ and equal bilaterally.  Respiratory:  Respirations regular and unlabored, clear to auscultation bilaterally. GI: Soft, nontender, nondistended, BS + x 4. MS: no deformity or atrophy. Skin: warm and dry, no rash. Neuro:  Strength and sensation are intact. Psych: Normal affect.  Accessory Clinical Findings    ECG personally reviewed by me today -regular sinus rhythm, 76, incomplete right bundle branch block with associated T wave abnormalities - no acute changes.  Labs dated 11/13/2020 - CareEverywhere - personally reviewed today  Hemoglobin 15.1, hematocrit 46.5, WBC 7.4, platelets 179 Sodium 138, potassium 4.2, chloride 100, CO2 28.9, BUN 13, creatinine 0.7, glucose 194 Calcium 9.7, albumin 4.3, total protein 7.5 Total bilirubin 0.8, alkaline phosphatase 60, AST 69, ALT 114 Hemoglobin A1c 8.4 Total cholesterol 210, triglycerides 272, HDL 29.7, LDL 126 PSA 0.64 TSH 0.914  Assessment & Plan    1.  Preoperative cardiovascular evaluation: Patient has a history of chest pain with normal  coronary arteries by catheterization in 2016 and subsequent low risk Myoview in December 2018.  Since his last visit in July 2021, he has been doing well.  He is currently being evaluated for gastric bypass.  In that setting, he has been exercising more frequently, walking a mile most days of the week and also walking quite a bit at work.  He does not experience any chest pain or dyspnea.  ECG is unchanged today.  Perioperative risk of major cardiac event calculates to 0.4%.  In this setting, and with lack of symptoms, he will not require any additional ischemic evaluation prior to surgery.  2.  Essential hypertension: Blood pressure 138/72 this morning however, patient has not yet taken his blood pressure medications.  He says he typically runs in the 120s at home.  We will not make any changes to his antihypertensive regimen which includes bisoprolol HCTZ and diltiazem.  He will need close monitoring of blood pressure in the perioperative period and especially postoperatively as his antihypertensive needs will hopefully reduce with weight loss.  3.  Hyperlipidemia/hypertriglyceridemia: LDL of 126 by labs March 22.  In the setting of diabetes, ideally he would be placed on a statin however, he does have history of elevated LFTs with an AST of 69 and an ALT of 114 in March.  He is currently treated with fenofibrate.  I suspect that lipids will improve significantly following weight loss surgery and thus will hold off on pursuing pcsk9i/inclisiran at this time.  4.  Dilated aortic root: Stable at 4.2 cm in August 2020.  We will arrange for follow-up imaging.  Continue blood pressure management.  5.  Paroxysmal atrial tachycardia/palpitations: Quiescent on beta-blocker and diltiazem therapy.  6.  Morbid obesity: Pending gastric bypass.  See #1.  7.  Type 2 diabetes mellitus: A1c 8.4 in March.  Management per primary care.  8.  Disposition: We will arrange for follow-up echocardiogram to evaluate dilated  aortic root.  Otherwise follow-up in 1 year.  Nicolasa Ducking, NP 12/06/2020, 12:43 PM

## 2020-12-07 ENCOUNTER — Other Ambulatory Visit: Payer: Self-pay | Admitting: Nurse Practitioner

## 2020-12-07 ENCOUNTER — Other Ambulatory Visit (HOSPITAL_COMMUNITY): Payer: Self-pay

## 2020-12-07 MED ORDER — DILTIAZEM HCL 60 MG PO TABS
60.0000 mg | ORAL_TABLET | Freq: Three times a day (TID) | ORAL | 1 refills | Status: DC
  Filled 2020-12-07: qty 270, 90d supply, fill #0

## 2020-12-07 NOTE — Telephone Encounter (Signed)
Rx request sent to pharmacy.  

## 2020-12-11 NOTE — Telephone Encounter (Signed)
Echo in wq  Closing encounter

## 2020-12-20 ENCOUNTER — Other Ambulatory Visit (HOSPITAL_COMMUNITY): Payer: Self-pay

## 2020-12-20 MED ORDER — METFORMIN HCL 850 MG PO TABS
850.0000 mg | ORAL_TABLET | Freq: Two times a day (BID) | ORAL | 1 refills | Status: DC
  Filled 2020-12-20: qty 180, 90d supply, fill #0

## 2020-12-21 ENCOUNTER — Other Ambulatory Visit: Payer: Self-pay

## 2020-12-21 ENCOUNTER — Ambulatory Visit
Admission: EM | Admit: 2020-12-21 | Discharge: 2020-12-21 | Disposition: A | Discharge status: Home / Self Care | Payer: No Typology Code available for payment source | Attending: Sports Medicine | Admitting: Sports Medicine

## 2020-12-21 DIAGNOSIS — S70361A Insect bite (nonvenomous), right thigh, initial encounter: Secondary | ICD-10-CM | POA: Diagnosis not present

## 2020-12-21 DIAGNOSIS — M795 Residual foreign body in soft tissue: Secondary | ICD-10-CM

## 2020-12-21 DIAGNOSIS — W57XXXA Bitten or stung by nonvenomous insect and other nonvenomous arthropods, initial encounter: Secondary | ICD-10-CM | POA: Diagnosis not present

## 2020-12-21 DIAGNOSIS — Z1839 Other retained organic fragments: Secondary | ICD-10-CM

## 2020-12-21 MED ORDER — DOXYCYCLINE HYCLATE 100 MG PO CAPS: 100.0000 mg | ORAL_CAPSULE | Freq: Two times a day (BID) | ORAL | 0 refills | Status: DC

## 2020-12-21 NOTE — ED Provider Notes (Signed)
[facility]    CSN: [id number] Arrival date & time: 12/21/20  1712      History   Chief Complaint Chief Complaint  Patient presents with  . Tick Removal    HPI Hector Curry is a 60 y.o. male.   Pleasant 60 year old male who presents for evaluation of the above issue.  Normally sees Dr. Judithann SheenSparks for his ongoing medical care.  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  He reports having a tick bite about 10 days ago when he was outside working in his yard.  His wife attempted to remove it but he has had retained products.  Subsequently has developed some redness around the area.  No fever shakes chills.  No nausea vomiting diarrhea.  No significant fatigue.  He denies chest pain or shortness of breath.  No red flag signs or symptoms are elicited on history.     Past Medical History:  Diagnosis Date  . Allergic rhinitis   . Coronary artery disease, non-occlusive    a. 05/08/2015 Cath: no significant CAD, LVEF nl-->Med; b. 07/2017 MV: attenuation artifact, no ischemia, EF 65%-->Low risk.  . Diabetes mellitus without complication (HCC)   . Diastolic dysfunction    a. 04/2015 Echo: EF 60-65%, mild MR, mildly dil LA, nl RV fxn, nl PASP; b. 05/2016 Echo: EF 60-65%, no rmwa, Gr1 DD, mildly dil Ao root and asc Ao; c. 07/2017 Echo: EF 55-60%, Ao root 42mm. mildly dil LA; d. 03/2019 Echo: EF 55-60%, nl RV fxn. Mild LAE, Ao root/Asc Ao 42mm.  . Dilated aortic root (HCC)    a. 07/2017 Echo: 42mm Ao root - mildly dil; b. 03/2019 Echo: Ao root 42mm.  . Diverticulosis 10/03/2014  . Family history of premature CAD    a. father passed from MI at 2549  . GERD (gastroesophageal reflux disease)   . Hemorrhoids    a. internal hemorrhoids s/p surgery 1999  . History of tobacco abuse   . Hyperlipidemia   . Hyperplastic colon polyp 10/03/2014   a. x 2   . Hypertension   . Inflammatory arthritis    a. CCP antibodies & x-rays negative. Rheumatoid  factor 14, felt to be crystaline over RA or psoriatic  . Kidney stones   . Morbid obesity (HCC)    a. s/p LAP-BAND  . OSA (obstructive sleep apnea)    a. on CPAP  . Osteoarthritis   . PSVT (paroxysmal supraventricular tachycardia) (HCC)    a. 48 hr Holter 04/2015: NSR w/ rare PVC, short runs of narrow complex tachycardiac, possible atrial tach, longest run 7 beats, PACs noted (2% of all beats 3600 total) they did not seem to correlate w/ significant arrythmia; b. 08/2017 Event monitor: no significant arrhythmias.    Patient Active Problem List   Diagnosis Date Noted  . History of kidney stones 05/30/2016  . Elevated LFTs 02/13/2016  . Morbid obesity with BMI of 40.0-44.9, adult (HCC) 12/20/2015  . Atrial tachycardia (HCC) 06/21/2015  . Hypertriglyceridemia 05/15/2015  . Coronary artery disease, non-occlusive   . Type II diabetes mellitus, uncontrolled (HCC) 05/08/2015  . Chest pain at rest   . Hyperglycemia 05/07/2015  . Chest pain 05/06/2015  . GERD (gastroesophageal reflux disease)   . Kidney stones   . Hemorrhoids   . Inflammatory arthritis   . Family history of premature CAD   . OSA (obstructive sleep apnea)   . History of tobacco abuse   .  Palpitations   . 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/20/2014  . Diverticulosis 10/03/2014  . Hyperplastic colon polyp 10/03/2014  . HTN, goal below 140/90 08/07/2014  . Pain in the chest 06/19/2014  . SOB (shortness of breath) 06/19/2014  . Obesity 06/19/2014  . Hyperlipidemia 06/19/2014    Past Surgical History:  Procedure Laterality Date  . BARIATRIC SURGERY     lap band   . CARDIAC CATHETERIZATION N/A 05/08/2015   Procedure: Left Heart Cath and Coronary Angiography;  Surgeon: Corky Crafts, MD;  Location: [facility] INVASIVE CV LAB;  Service: Cardiovascular;  Laterality: N/A;  . CARPAL TUNNEL RELEASE Left   . CHOLECYSTECTOMY    . COLONOSCOPY  06/27/2005  . COLONOSCOPY  10/03/2014  . ESOPHAGOGASTRODUODENOSCOPY  06/27/2005  .  HEMORRHOID SURGERY  1999  . TONSILLECTOMY         Home Medications    Prior to Admission medications   Medication Sig Start Date End Date Taking? Authorizing Provider  aspirin 81 MG tablet Take 81 mg by mouth daily.   Yes [provider]  bisoprolol-hydrochlorothiazide [facility]) 10-6.25 MG tablet TAKE 1 TABLET BY MOUTH ONCE DAILY 11/13/20 11/13/21 Yes Marguarite Arbour, MD  cholecalciferol (VITAMIN D) 1000 UNITS tablet Take 1,000 Units by mouth daily.    Yes [provider]  cyanocobalamin 2000 MCG tablet Take 2,000 mcg by mouth daily.    Yes [provider]  diltiazem (CARDIZEM) 60 MG tablet Take 1 tablet (60 mg total) by mouth every 8 (eight) hours. 12/07/20 12/07/21 Yes Creig Hines, NP  doxycycline (VIBRAMYCIN) 100 MG capsule Take 1 capsule (100 mg total) by mouth 2 (two) times daily. 12/21/20  Yes Delton See, MD  fenofibrate (TRICOR) 145 MG tablet Take 145 mg by mouth daily.  01/09/17  Yes [provider]  fexofenadine (ALLEGRA) 180 MG tablet Take 180 mg by mouth daily.   Yes [provider]  fluticasone (FLONASE) 50 MCG/ACT nasal spray Place 1 spray into both nostrils daily as needed for allergies or rhinitis.   Yes [provider]  hydroxypropyl methylcellulose / hypromellose (ISOPTO TEARS / GONIOVISC) 2.5 % ophthalmic solution Place 1 drop into both eyes as needed for dry eyes.   Yes [provider]  Magnesium Oxide 400 (240 Mg) MG TABS Take 1 tablet (400 mg total) by mouth daily. 07/30/17  Yes Dunn, Raymon Mutton, PA-C  metFORMIN (GLUCOPHAGE) 850 MG tablet Take 1 tablet (850 mg total) by mouth 2 (two) times daily with a meal. Do not start before 16th of this month 05/11/15  Yes Leroy Sea, MD  pioglitazone (ACTOS) 30 MG tablet Take 1 tablet (30 mg total) by mouth daily. 02/23/18  Yes Gollan, Tollie Pizza, MD  potassium chloride SA (KLOR-CON) 20 MEQ tablet TAKE 1 TABLET BY MOUTH TWO TIMES DAILY 03/28/20 03/28/21 Yes Gollan,  Tollie Pizza, MD  propranolol (INDERAL) 20 MG tablet TAKE 1 TABLET BY MOUTH 3  TIMES DAILY AS NEEDED 06/02/17  Yes Gollan, Tollie Pizza, MD  Resveratrol-Quercetin 100-100 MG TABS Take 1 tablet by mouth daily.   Yes [provider]  vitamin E 180 MG (400 UNITS) capsule Take by mouth.   Yes [provider]  metFORMIN (GLUCOPHAGE) 850 MG tablet Take 1 tablet (850 mg total) by mouth 2 (two) times daily. 06/13/20     QUERCETIN PO Take by mouth daily at 8 pm.    [provider]    Family History Family History  Problem Relation Age of Onset  .  Hypertension Mother   . Diabetes Mother   . Heart attack Father 73  . CAD Father   . Hypertension Sister   . Diabetes Other   . Hypertension Other   . Heart disease Other     Social History Social History   Tobacco Use  . Smoking status: Former Smoker    Packs/day: 1.00    Years: 15.00    Pack years: 15.00    Types: Cigarettes    Quit date: 08/05/1989    Years since quitting: 31.4  . Smokeless tobacco: Never Used  Vaping Use  . Vaping Use: Never used  Substance Use Topics  . Alcohol use: No  . Drug use: No     Allergies   Patient has no known allergies.   Review of Systems Review of Systems  Constitutional: Negative.  Negative for activity change, appetite change, chills, diaphoresis, fatigue and fever.  HENT: Negative.  Negative for congestion.   Eyes: Negative.  Negative for pain.  Respiratory: Negative for cough and chest tightness.   Cardiovascular: Negative.  Negative for chest pain and palpitations.  Gastrointestinal: Negative.  Negative for abdominal pain, diarrhea, nausea and vomiting.  Genitourinary: Negative.  Negative for dysuria.  Musculoskeletal: Negative.  Negative for arthralgias, back pain, joint swelling and myalgias.  Skin: Positive for color change and wound. Negative for pallor and rash.  Neurological: Negative.  Negative for dizziness, syncope, weakness, light-headedness and headaches.   All other systems reviewed and are negative.    Physical Exam Triage Vital Signs ED Triage Vitals  Enc Vitals Group     BP 12/21/20 1724 (!) 138/127     Pulse Rate 12/21/20 1724 71     Resp 12/21/20 1724 18     Temp 12/21/20 1724 98.4 F (36.9 C)     Temp Source 12/21/20 1724 Oral     SpO2 12/21/20 1724 96 %     Weight 12/21/20 1720 (!) 315 lb (142.9 kg)     Height 12/21/20 1720 6\' 2"  (1.88 m)     Head Circumference --      Peak Flow --      Pain Score 12/21/20 1720 0     Pain Loc --      Pain Edu? --      Excl. in GC? --    No data found.  Updated Vital Signs BP (!) 138/127 (BP Location: Left Arm)   Pulse 71   Temp 98.4 F (36.9 C) (Oral)   Resp 18   Ht 6\' 2"  (1.88 m)   Wt (!) 142.9 kg   SpO2 96%   BMI 40.44 kg/m   Visual Acuity Right Eye Distance:   Left Eye Distance:   Bilateral Distance:    Right Eye Near:   Left Eye Near:    Bilateral Near:     Physical Exam Vitals and nursing note reviewed.  Constitutional:      General: He is not in acute distress.    Appearance: Normal appearance. He is not ill-appearing, toxic-appearing or diaphoretic.  HENT:     Head: Normocephalic.  Eyes:     General: No scleral icterus.       Right eye: No discharge.        Left eye: No discharge.     Conjunctiva/sclera: Conjunctivae normal.  Cardiovascular:     Rate and Rhythm: Normal rate and regular rhythm.     Heart sounds: Normal heart sounds.  Pulmonary:     Effort: Pulmonary effort  is normal.     Breath sounds: Normal breath sounds.  Musculoskeletal:     Cervical back: Normal range of motion and neck supple. No rigidity or tenderness.  Lymphadenopathy:     Cervical: No cervical adenopathy.  Skin:    General: Skin is warm.     Capillary Refill: Capillary refill takes less than 2 seconds.     Findings: Erythema, lesion and wound present.     Comments: There is a small lesion in the right upper thigh over the iliotibial band.  There is some dried blood.  No  retained products visualized.  There are some mild erythema around that area.  There is no lymphadenopathy.  No abscess formation.  No phlebitis.  Neurological:     General: No focal deficit present.     Mental Status: He is alert and oriented to person, place, and time.      UC Treatments / Results  Labs (all labs ordered are listed, but only abnormal results are displayed) Labs Reviewed - No data to display  EKG   Radiology No results found.  Procedures Procedures (including critical care time)  Medications Ordered in UC Medications - No data to display  Initial Impression / Assessment and Plan / UC Course  I have reviewed the triage vital signs and the nursing notes.  Pertinent labs & imaging results that were available during my care of the patient were reviewed by me and considered in my medical decision making (see chart for details).  Clinical impression: Tick bite about 10 days ago with some mild erythema around that area.  Patient is concerned about some retained products.  None are visualized.  There is a dried blood scab around that area.  Treatment plan: 1.  The findings and treatment plan were discussed in detail with the patient.  Patient was in agreement. 2.  I discussed with him that I could attempt to see if there were any products but that would necessitate taking that scab formation off and causing him to bleed.  I also indicated I could in part infection.  We discussed that the best thing to do would be allow his body to take care of any retained products and treat him for his tick bite.  He was in agreement with that. 3.  Prescribed doxycycline and sent it to his pharmacy. 4.  Educational handouts provided. 5.  Over-the-counter meds as needed. 6.  Discharged from care in stable condition and he will follow-up here as needed.    Final Clinical Impressions(s) / UC Diagnoses   Final diagnoses:  Tick bite of right thigh, initial encounter  Retained tick  parts of thigh     Discharge Instructions     Please see educational handouts. Please take antibiotic as prescribed.    ED Prescriptions    Medication Sig Dispense Auth. Provider   doxycycline (VIBRAMYCIN) 100 MG capsule Take 1 capsule (100 mg total) by mouth 2 (two) times daily. 20 capsule Delton See, MD     PDMP not reviewed this encounter.   Delton See, MD 12/21/20 [phone]

## 2020-12-21 NOTE — ED Triage Notes (Signed)
Pt c/o tick bite to his right outer thigh, a week and a half ago. He states his wife attempted to remove the tick, but he believes there is still some of it attached. There is a red area around the site. Pt denies f/n/v/d or other symptoms.

## 2020-12-21 NOTE — Discharge Instructions (Addendum)
Please see educational handouts. Please take antibiotic as prescribed.

## 2020-12-27 ENCOUNTER — Other Ambulatory Visit (HOSPITAL_COMMUNITY): Payer: Self-pay

## 2021-01-10 ENCOUNTER — Ambulatory Visit (INDEPENDENT_AMBULATORY_CARE_PROVIDER_SITE_OTHER): Payer: No Typology Code available for payment source | Admitting: Psychology

## 2021-01-10 DIAGNOSIS — F509 Eating disorder, unspecified: Secondary | ICD-10-CM

## 2021-01-14 ENCOUNTER — Other Ambulatory Visit (HOSPITAL_COMMUNITY): Payer: Self-pay | Admitting: Internal Medicine

## 2021-01-14 ENCOUNTER — Other Ambulatory Visit (HOSPITAL_COMMUNITY): Payer: Self-pay

## 2021-01-14 MED FILL — Potassium Chloride Microencapsulated Crys ER Tab 20 mEq: ORAL | 90 days supply | Qty: 180 | Fill #0 | Status: AC

## 2021-01-15 ENCOUNTER — Other Ambulatory Visit (HOSPITAL_COMMUNITY): Payer: Self-pay

## 2021-01-15 MED ORDER — PIOGLITAZONE HCL 30 MG PO TABS
30.0000 mg | ORAL_TABLET | Freq: Every day | ORAL | 2 refills | Status: DC
Start: 2021-01-14 — End: 2021-02-11
  Filled 2021-01-15: qty 90, 90d supply, fill #0

## 2021-01-22 ENCOUNTER — Other Ambulatory Visit (HOSPITAL_COMMUNITY): Payer: Self-pay

## 2021-01-22 MED FILL — Bisoprolol & Hydrochlorothiazide Tab 10-6.25 MG: ORAL | 90 days supply | Qty: 90 | Fill #0 | Status: AC

## 2021-01-23 ENCOUNTER — Other Ambulatory Visit (HOSPITAL_COMMUNITY): Payer: Self-pay

## 2021-01-23 HISTORY — PX: BARIATRIC SURGERY: SHX1103

## 2021-01-31 ENCOUNTER — Ambulatory Visit: Payer: Self-pay | Admitting: General Surgery

## 2021-02-04 ENCOUNTER — Encounter: Payer: No Typology Code available for payment source | Attending: General Surgery | Admitting: Skilled Nursing Facility1

## 2021-02-04 ENCOUNTER — Other Ambulatory Visit: Payer: Self-pay

## 2021-02-04 DIAGNOSIS — E1165 Type 2 diabetes mellitus with hyperglycemia: Secondary | ICD-10-CM | POA: Insufficient documentation

## 2021-02-04 DIAGNOSIS — Z6841 Body Mass Index (BMI) 40.0 and over, adult: Secondary | ICD-10-CM | POA: Insufficient documentation

## 2021-02-04 NOTE — Progress Notes (Signed)
Pre-Operative Nutrition Class:    Patient was seen on 02/04/2021 for Pre-Operative Bariatric Surgery Education at the [facility].    Surgery date: 02/18/2021 Surgery type: RYGB Start weight at [facility]: 328.7 Weight today: 327.6  Samples given per MNT protocol. Patient educated on appropriate usage: Bariatric advantage Vitamins Multivitamin Lot # [id number] Exp: 08/2021   procare Vitamins Calcium Lot # [id number] Exp: 01/23    Protein  Shake Lot #  [id number] Exp:  11/2021  The following the learning objectives were met by the patient during this course: Identify Pre-Op Dietary Goals and will begin 2 weeks pre-operatively Identify appropriate sources of fluids and proteins  State protein recommendations and appropriate sources pre and post-operatively Identify Post-Operative Dietary Goals and will follow for 2 weeks post-operatively Identify appropriate multivitamin and calcium sources Describe the need for physical activity post-operatively and will follow MD recommendations State when to call healthcare provider regarding medication questions or post-operative complications When having a diagnosis of diabetes understanding hypoglycemia symptoms and the inclusion of 1 complex carbohydrate per meal  Handouts given during class include: Pre-Op Bariatric Surgery Diet Handout Protein Shake Handout Post-Op Bariatric Surgery Nutrition Handout BELT Program Information Flyer Support Group Information Flyer [facility] Bariatric Supplements Price List  Follow-Up Plan: Patient will follow-up at [facility] 2 weeks post operatively for diet advancement per MD.

## 2021-02-07 NOTE — Progress Notes (Signed)
DUE TO COVID-19 ONLY ONE VISITOR IS ALLOWED TO COME WITH YOU AND STAY IN THE WAITING ROOM ONLY DURING PRE OP AND PROCEDURE DAY OF SURGERY. THE 1 VISITOR  MAY VISIT WITH YOU AFTER SURGERY IN YOUR PRIVATE ROOM DURING VISITING HOURS ONLY!  YOU NEED TO HAVE A COVID 19 TEST ON___6/23/22 ____ @_______ , THIS TEST MUST BE DONE BEFORE SURGERY,  COVID TESTING SITE [address] [place] , IT IS ON THE RIGHT GOING OUT [address] APPROXIMATELY  2 MINUTES PAST [employer] ON THE RIGHT. ONCE YOUR COVID TEST IS COMPLETED,  PLEASE BEGIN THE QUARANTINE INSTRUCTIONS AS OUTLINED IN YOUR HANDOUT.                Hector Curry  02/07/2021   Your procedure is scheduled on:  02/18/21   Report to [facility] Main  Entrance   Report to admitting at    [phone]     Call this number if you have problems the morning of surgery [phone]    REMEMBER: NO  SOLID FOOD CANDY OR GUM AFTER MIDNIGHT. CLEAR LIQUIDS UNTIL   0645am       . NOTHING BY MOUTH EXCEPT CLEAR LIQUIDS UNTIL   0645am    . PLEASE FINISH ENSURE DRINK PER SURGEON ORDER  WHICH NEEDS TO BE COMPLETED AT  0645am     .      CLEAR LIQUID DIET   Foods Allowed                                                                    Coffee and tea, regular and decaf                            Fruit ices (not with fruit pulp)                                      Iced Popsicles                                    Carbonated beverages, regular and diet                                    Cranberry, grape and apple juices Sports drinks like Gatorade Lightly seasoned clear broth or consume(fat free) Sugar, honey syrup ___________________________________________________________________      BRUSH YOUR TEETH MORNING OF SURGERY AND RINSE YOUR MOUTH OUT, NO CHEWING GUM CANDY OR MINTS.     Take these medicines the morning of surgery with A SIP OF WATER:    Proppanolol if needed, magnesium, allegra, Cardizem  DO NOT TAKE  ANY DIABETIC MEDICATIONS DAY OF YOUR SURGERY                               You may not have any metal on your body including hair pins and  piercings  Do not wear jewelry, make-up, lotions, powders or perfumes, deodorant             Do not wear nail polish on your fingernails.  Do not shave  48 hours prior to surgery.              Men may shave face and neck.   Do not bring valuables to the hospital. [place].  Contacts, dentures or bridgework may not be worn into surgery.  Leave suitcase in the car. After surgery it may be brought to your room.     Patients discharged the day of surgery will not be allowed to drive home. IF YOU ARE HAVING SURGERY AND GOING HOME THE SAME DAY, YOU MUST HAVE AN ADULT TO DRIVE YOU HOME AND BE WITH YOU FOR 24 HOURS. YOU MAY GO HOME BY TAXI OR UBER OR ORTHERWISE, BUT AN ADULT MUST ACCOMPANY YOU HOME AND STAY WITH YOU FOR 24 HOURS.  Name and phone number of your driver:  Special Instructions: N/A              Please read over the following fact sheets you were given: _____________________________________________________________________  [facility] - Preparing for Surgery Before surgery, you can play an important role.  Because skin is not sterile, your skin needs to be as free of germs as possible.  You can reduce the number of germs on your skin by washing with CHG (chlorahexidine gluconate) soap before surgery.  CHG is an antiseptic cleaner which kills germs and bonds with the skin to continue killing germs even after washing. Please DO NOT use if you have an allergy to CHG or antibacterial soaps.  If your skin becomes reddened/irritated stop using the CHG and inform your nurse when you arrive at Short Stay. Do not shave (including legs and underarms) for at least 48 hours prior to the first CHG shower.  You may shave your face/neck. Please follow these instructions carefully:  1.  Shower with CHG  Soap the night before surgery and the  morning of Surgery.  2.  If you choose to wash your hair, wash your hair first as usual with your  normal  shampoo.  3.  After you shampoo, rinse your hair and body thoroughly to remove the  shampoo.                           4.  Use CHG as you would any other liquid soap.  You can apply chg directly  to the skin and wash                       Gently with a scrungie or clean washcloth.  5.  Apply the CHG Soap to your body ONLY FROM THE NECK DOWN.   Do not use on face/ open                           Wound or open sores. Avoid contact with eyes, ears mouth and genitals (private parts).                       Wash face,  Genitals (private parts) with your normal soap.             6.  Wash thoroughly, paying special attention to the area where your surgery  will be performed.  7.  Thoroughly rinse your body with warm water from the neck down.  8.  DO NOT shower/wash with your normal soap after using and rinsing off  the CHG Soap.                9.  Pat yourself dry with a clean towel.            10.  Wear clean pajamas.            11.  Place clean sheets on your bed the night of your first shower and do not  sleep with pets. Day of Surgery : Do not apply any lotions/deodorants the morning of surgery.  Please wear clean clothes to the hospital/surgery center.  FAILURE TO FOLLOW THESE INSTRUCTIONS MAY RESULT IN THE CANCELLATION OF YOUR SURGERY PATIENT SIGNATURE_________________________________  NURSE SIGNATURE__________________________________  ________________________________________________________________________

## 2021-02-11 ENCOUNTER — Encounter (HOSPITAL_COMMUNITY)
Admission: RE | Admit: 2021-02-11 | Discharge: 2021-02-11 | Disposition: A | Discharge status: Home / Self Care | Payer: No Typology Code available for payment source | Source: Ambulatory Visit | Attending: General Surgery | Admitting: General Surgery

## 2021-02-11 ENCOUNTER — Other Ambulatory Visit: Payer: Self-pay

## 2021-02-11 ENCOUNTER — Encounter (HOSPITAL_COMMUNITY): Payer: Self-pay

## 2021-02-11 DIAGNOSIS — Z01812 Encounter for preprocedural laboratory examination: Secondary | ICD-10-CM | POA: Insufficient documentation

## 2021-02-11 HISTORY — DX: Unspecified atrial fibrillation: I48.91

## 2021-02-11 HISTORY — DX: Cardiac arrhythmia, unspecified: I49.9

## 2021-02-11 HISTORY — DX: Personal history of urinary calculi: Z87.442

## 2021-02-11 LAB — CBC WITH DIFFERENTIAL/PLATELET
Abs Immature Granulocytes: 0.03 10*3/uL (ref 0.00–0.07)
Basophils Absolute: 0 10*3/uL (ref 0.0–0.1)
Basophils Relative: 1 %
Eosinophils Absolute: 0.2 10*3/uL (ref 0.0–0.5)
Eosinophils Relative: 2 %
HCT: 46.6 % (ref 39.0–52.0)
Hemoglobin: 14.9 g/dL (ref 13.0–17.0)
Immature Granulocytes: 1 %
Lymphocytes Relative: 22 %
Lymphs Abs: 1.4 10*3/uL (ref 0.7–4.0)
MCH: 29.2 pg (ref 26.0–34.0)
MCHC: 32 g/dL (ref 30.0–36.0)
MCV: 91.2 fL (ref 80.0–100.0)
Monocytes Absolute: 0.6 10*3/uL (ref 0.1–1.0)
Monocytes Relative: 9 %
Neutro Abs: 4.1 10*3/uL (ref 1.7–7.7)
Neutrophils Relative %: 65 %
Platelets: 176 10*3/uL (ref 150–400)
RBC: 5.11 MIL/uL (ref 4.22–5.81)
RDW: 13.7 % (ref 11.5–15.5)
WBC: 6.3 10*3/uL (ref 4.0–10.5)
nRBC: 0 % (ref 0.0–0.2)

## 2021-02-11 LAB — COMPREHENSIVE METABOLIC PANEL
ALT: 90 U/L — ABNORMAL HIGH (ref 0–44)
AST: 57 U/L — ABNORMAL HIGH (ref 15–41)
Albumin: 4.3 g/dL (ref 3.5–5.0)
Alkaline Phosphatase: 48 U/L (ref 38–126)
Anion gap: 8 (ref 5–15)
BUN: 17 mg/dL (ref 6–20)
CO2: 28 mmol/L (ref 22–32)
Calcium: 9.6 mg/dL (ref 8.9–10.3)
Chloride: 103 mmol/L (ref 98–111)
Creatinine, Ser: 0.9 mg/dL (ref 0.61–1.24)
GFR, Estimated: >60 mL/min (ref >60)
Glucose, Bld: 132 mg/dL — ABNORMAL HIGH (ref 70–99)
Potassium: 4.2 mmol/L (ref 3.5–5.1)
Sodium: 139 mmol/L (ref 135–145)
Total Bilirubin: 0.7 mg/dL (ref 0.3–1.2)
Total Protein: 7.9 g/dL (ref 6.5–8.1)

## 2021-02-11 LAB — HEMOGLOBIN A1C
Hgb A1c MFr Bld: 8.1 % — ABNORMAL HIGH (ref 4.8–5.6)
Mean Plasma Glucose: 186 mg/dL

## 2021-02-11 NOTE — Progress Notes (Addendum)
Anesthesia Review:  PCP: DR Aram Beecham  LOV 10/24/2020  Cardiologist : DR Mariah Milling  Clearance- 12/06/20- LOV for clearance  Nicolasa Ducking  Pulmonary- Dr Mariella Saa LOV 11/29/20  Chest x-ray : 11/29/20- PFT  EKG : 12/06/20  Echo : 2020  Stress test: 2018  Cardiac Cath : 2016  Activity level: can do a flgiht of stairs without difficul ty  Sleep Study/ CPAP : Has Bipap  Fasting Blood Sugar :      / Checks Blood Sugar -- times a day:   Onlly checks glucose every 3-4 months per pt  Hgba1c- 8.1 on 02/11/21 - routed to DR Kinsinger on 02/12/21  Blood Thinner/ Instructions /Last Dose: ASA / Instructions/ Last Dose :   81 mg Aspirin  [employer].  Pt waited.  Allowed pt to leave When Pharmacy Tech arrived told them they would need to call pt.  I had placed meds.  They just needed to verify. Unable to do glucose at preop.  Glucometer was malfunctioning.  Attempted x 3 to do glucose check.  Pt reported he had eaten breakfast and taken meds before preop.   Pt works at [employer].  Called and spoke with Britta Mccreedy and received permission to do covid test at [facility] on 02/14/2021 at 1100am.  Spoke with Eber Jones in PST dept and she set up appt.  Made pt aware prior to leaving preop appt.

## 2021-02-13 ENCOUNTER — Other Ambulatory Visit (HOSPITAL_COMMUNITY): Payer: Self-pay

## 2021-02-13 NOTE — Anesthesia Preprocedure Evaluation (Addendum)
Anesthesia Evaluation  Patient identified by MRN, date of birth, ID band Patient awake    Reviewed: Allergy & Precautions, NPO status , Patient's Chart, lab work & pertinent test results  Airway Mallampati: II  TM Distance: >3 FB     Dental   Pulmonary sleep apnea , former smoker,    breath sounds clear to auscultation       Cardiovascular hypertension, + CAD  + dysrhythmias  Rhythm:Regular Rate:Normal     Neuro/Psych    GI/Hepatic Neg liver ROS, GERD  ,  Endo/Other  diabetes  Renal/GU Renal disease     Musculoskeletal   Abdominal   Peds  Hematology   Anesthesia Other Findings   Reproductive/Obstetrics                            Anesthesia Physical Anesthesia Plan  ASA: 3  Anesthesia Plan: General   Post-op Pain Management:    Induction: Intravenous  PONV Risk Score and Plan: 2 and Ondansetron, Dexamethasone and Midazolam  Airway Management Planned: Oral ETT and Video Laryngoscope Planned  Additional Equipment:   Intra-op Plan:   Post-operative Plan: Possible Post-op intubation/ventilation  Informed Consent: I have reviewed the patients History and Physical, chart, labs and discussed the procedure including the risks, benefits and alternatives for the proposed anesthesia with the patient or authorized representative who has indicated his/her understanding and acceptance.     Dental advisory given  Plan Discussed with:   Anesthesia Plan Comments: (Per cardiology note 12/06/2020, "Preoperative cardiovascular evaluation: Patient has a history of chest pain with normal coronary arteries by catheterization in 2016 and subsequent low risk Myoview in December 2018.  Since his last visit in July 2021, he has been doing well.  He is currently being evaluated for gastric bypass.  In that setting, he has been exercising more frequently, walking a mile most days of the week and also walking  quite a bit at work.  He does not experience any chest pain or dyspnea.  ECG is unchanged today.  Perioperative risk of major cardiac event calculates to 0.4%.  In this setting, and with lack of symptoms, he will not require any additional ischemic evaluation prior to surgery")       Anesthesia Quick Evaluation

## 2021-02-14 ENCOUNTER — Other Ambulatory Visit (HOSPITAL_COMMUNITY)
Admission: RE | Admit: 2021-02-14 | Discharge: 2021-02-14 | Disposition: A | Discharge status: Home / Self Care | Payer: No Typology Code available for payment source | Source: Ambulatory Visit | Attending: General Surgery | Admitting: General Surgery

## 2021-02-14 ENCOUNTER — Other Ambulatory Visit: Payer: Self-pay

## 2021-02-14 DIAGNOSIS — Z01812 Encounter for preprocedural laboratory examination: Secondary | ICD-10-CM | POA: Insufficient documentation

## 2021-02-14 DIAGNOSIS — Z20822 Contact with and (suspected) exposure to covid-19: Secondary | ICD-10-CM | POA: Diagnosis not present

## 2021-02-14 LAB — SARS CORONAVIRUS 2 (TAT 6-24 HRS): SARS Coronavirus 2: NEGATIVE

## 2021-02-17 MED ORDER — BUPIVACAINE LIPOSOME 1.3 % IJ SUSP
20.0000 mL | Freq: Once | INTRAMUSCULAR | Status: DC
  Filled 2021-02-17: qty 20

## 2021-02-17 MED ORDER — SODIUM CHLORIDE 0.9 % IV SOLN
2.0000 g | INTRAVENOUS | Status: AC
  Administered 2021-02-18: 2 g via INTRAVENOUS
  Filled 2021-02-17: qty 2

## 2021-02-18 ENCOUNTER — Inpatient Hospital Stay (HOSPITAL_COMMUNITY): Payer: No Typology Code available for payment source | Admitting: Physician Assistant

## 2021-02-18 ENCOUNTER — Inpatient Hospital Stay (HOSPITAL_COMMUNITY): Payer: No Typology Code available for payment source | Admitting: Certified Registered Nurse Anesthetist

## 2021-02-18 ENCOUNTER — Other Ambulatory Visit: Payer: Self-pay

## 2021-02-18 ENCOUNTER — Encounter (HOSPITAL_COMMUNITY)
Admission: RE | Disposition: A | Discharge status: Home / Self Care | Payer: Self-pay | Source: Home / Self Care | Attending: General Surgery

## 2021-02-18 ENCOUNTER — Encounter (HOSPITAL_COMMUNITY): Payer: Self-pay | Admitting: General Surgery

## 2021-02-18 ENCOUNTER — Inpatient Hospital Stay (HOSPITAL_COMMUNITY)
Admission: RE | Admit: 2021-02-18 | Discharge: 2021-02-20 | DRG: 621 | Disposition: A | Discharge status: Home / Self Care | Payer: No Typology Code available for payment source | Attending: General Surgery | Admitting: General Surgery

## 2021-02-18 DIAGNOSIS — I4891 Unspecified atrial fibrillation: Secondary | ICD-10-CM | POA: Diagnosis present

## 2021-02-18 DIAGNOSIS — M1990 Unspecified osteoarthritis, unspecified site: Secondary | ICD-10-CM | POA: Diagnosis present

## 2021-02-18 DIAGNOSIS — Z87891 Personal history of nicotine dependence: Secondary | ICD-10-CM | POA: Diagnosis not present

## 2021-02-18 DIAGNOSIS — Z833 Family history of diabetes mellitus: Secondary | ICD-10-CM

## 2021-02-18 DIAGNOSIS — I2510 Atherosclerotic heart disease of native coronary artery without angina pectoris: Secondary | ICD-10-CM | POA: Diagnosis present

## 2021-02-18 DIAGNOSIS — K219 Gastro-esophageal reflux disease without esophagitis: Secondary | ICD-10-CM | POA: Diagnosis present

## 2021-02-18 DIAGNOSIS — Z6841 Body Mass Index (BMI) 40.0 and over, adult: Secondary | ICD-10-CM

## 2021-02-18 DIAGNOSIS — E785 Hyperlipidemia, unspecified: Secondary | ICD-10-CM | POA: Diagnosis present

## 2021-02-18 DIAGNOSIS — E119 Type 2 diabetes mellitus without complications: Secondary | ICD-10-CM | POA: Diagnosis present

## 2021-02-18 DIAGNOSIS — G4733 Obstructive sleep apnea (adult) (pediatric): Secondary | ICD-10-CM | POA: Diagnosis present

## 2021-02-18 DIAGNOSIS — Z7984 Long term (current) use of oral hypoglycemic drugs: Secondary | ICD-10-CM

## 2021-02-18 DIAGNOSIS — Z8249 Family history of ischemic heart disease and other diseases of the circulatory system: Secondary | ICD-10-CM

## 2021-02-18 DIAGNOSIS — Z79899 Other long term (current) drug therapy: Secondary | ICD-10-CM | POA: Diagnosis not present

## 2021-02-18 DIAGNOSIS — Z7982 Long term (current) use of aspirin: Secondary | ICD-10-CM

## 2021-02-18 DIAGNOSIS — I10 Essential (primary) hypertension: Secondary | ICD-10-CM | POA: Diagnosis present

## 2021-02-18 HISTORY — PX: GASTRIC ROUX-EN-Y: SHX5262

## 2021-02-18 LAB — TYPE AND SCREEN
ABO/RH(D): B POS
Antibody Screen: NEGATIVE

## 2021-02-18 LAB — GLUCOSE, CAPILLARY
Glucose-Capillary: 145 mg/dL — ABNORMAL HIGH (ref 70–99)
Glucose-Capillary: 227 mg/dL — ABNORMAL HIGH (ref 70–99)
Glucose-Capillary: 284 mg/dL — ABNORMAL HIGH (ref 70–99)
Glucose-Capillary: 294 mg/dL — ABNORMAL HIGH (ref 70–99)

## 2021-02-18 LAB — HEMOGLOBIN AND HEMATOCRIT, BLOOD
HCT: 50.3 % (ref 39.0–52.0)
Hemoglobin: 16 g/dL (ref 13.0–17.0)

## 2021-02-18 LAB — ABO/RH: ABO/RH(D): B POS

## 2021-02-18 SURGERY — LAPAROSCOPIC ROUX-EN-Y GASTRIC BYPASS WITH UPPER ENDOSCOPY
Anesthesia: General | Site: Abdomen

## 2021-02-18 MED ORDER — ROCURONIUM BROMIDE 10 MG/ML (PF) SYRINGE
PREFILLED_SYRINGE | INTRAVENOUS | Status: AC
  Filled 2021-02-18: qty 10

## 2021-02-18 MED ORDER — SUGAMMADEX SODIUM 500 MG/5ML IV SOLN
INTRAVENOUS | Status: AC
  Filled 2021-02-18: qty 5

## 2021-02-18 MED ORDER — EPHEDRINE SULFATE-NACL 50-0.9 MG/10ML-% IV SOSY
PREFILLED_SYRINGE | INTRAVENOUS | Status: DC | PRN
  Administered 2021-02-18 (×2): 10 mg via INTRAVENOUS

## 2021-02-18 MED ORDER — MORPHINE SULFATE (PF) 2 MG/ML IV SOLN: 1.0000 mg | INTRAVENOUS | Status: DC | PRN

## 2021-02-18 MED ORDER — DEXTROSE-NACL 5-0.45 % IV SOLN: INTRAVENOUS | Status: DC

## 2021-02-18 MED ORDER — BUPIVACAINE-EPINEPHRINE 0.25% -1:200000 IJ SOLN
INTRAMUSCULAR | Status: DC | PRN
  Administered 2021-02-18: 30 mL

## 2021-02-18 MED ORDER — SUCCINYLCHOLINE CHLORIDE 200 MG/10ML IV SOSY
PREFILLED_SYRINGE | INTRAVENOUS | Status: AC
  Filled 2021-02-18: qty 10

## 2021-02-18 MED ORDER — LACTATED RINGERS IV SOLN: INTRAVENOUS | Status: DC | PRN

## 2021-02-18 MED ORDER — BISOPROLOL-HYDROCHLOROTHIAZIDE 10-6.25 MG PO TABS
1.0000 | ORAL_TABLET | Freq: Every day | ORAL | Status: DC
  Administered 2021-02-19 – 2021-02-20 (×2): 1 via ORAL
  Filled 2021-02-18 (×2): qty 1

## 2021-02-18 MED ORDER — ONDANSETRON HCL 4 MG/2ML IJ SOLN: 4.0000 mg | INTRAMUSCULAR | Status: DC | PRN

## 2021-02-18 MED ORDER — SUGAMMADEX SODIUM 200 MG/2ML IV SOLN
INTRAVENOUS | Status: DC | PRN
  Administered 2021-02-18: 400 mg via INTRAVENOUS

## 2021-02-18 MED ORDER — PROPRANOLOL HCL 20 MG PO TABS
20.0000 mg | ORAL_TABLET | Freq: Every day | ORAL | Status: DC | PRN
  Filled 2021-02-18: qty 1

## 2021-02-18 MED ORDER — SIMETHICONE 80 MG PO CHEW: 80.0000 mg | CHEWABLE_TABLET | Freq: Four times a day (QID) | ORAL | Status: DC | PRN

## 2021-02-18 MED ORDER — FENTANYL CITRATE (PF) 250 MCG/5ML IJ SOLN
INTRAMUSCULAR | Status: AC
  Filled 2021-02-18: qty 5

## 2021-02-18 MED ORDER — ROCURONIUM BROMIDE 10 MG/ML (PF) SYRINGE
PREFILLED_SYRINGE | INTRAVENOUS | Status: DC | PRN
  Administered 2021-02-18: 20 mg via INTRAVENOUS
  Administered 2021-02-18: 40 mg via INTRAVENOUS
  Administered 2021-02-18: 20 mg via INTRAVENOUS
  Administered 2021-02-18: 40 mg via INTRAVENOUS
  Administered 2021-02-18 (×2): 20 mg via INTRAVENOUS

## 2021-02-18 MED ORDER — GABAPENTIN 300 MG PO CAPS
300.0000 mg | ORAL_CAPSULE | ORAL | Status: AC
  Administered 2021-02-18: 300 mg via ORAL
  Filled 2021-02-18: qty 1

## 2021-02-18 MED ORDER — BUPIVACAINE-EPINEPHRINE (PF) 0.25% -1:200000 IJ SOLN
INTRAMUSCULAR | Status: AC
  Filled 2021-02-18: qty 30

## 2021-02-18 MED ORDER — INSULIN ASPART 100 UNIT/ML IJ SOLN
0.0000 [IU] | INTRAMUSCULAR | Status: DC
  Administered 2021-02-18 – 2021-02-19 (×4): 11 [IU] via SUBCUTANEOUS
  Administered 2021-02-19 (×2): 4 [IU] via SUBCUTANEOUS
  Administered 2021-02-19: 3 [IU] via SUBCUTANEOUS
  Administered 2021-02-20: 4 [IU] via SUBCUTANEOUS
  Administered 2021-02-20: 3 [IU] via SUBCUTANEOUS
  Administered 2021-02-20: 4 [IU] via SUBCUTANEOUS

## 2021-02-18 MED ORDER — HYDROMORPHONE HCL 1 MG/ML IJ SOLN
INTRAMUSCULAR | Status: AC
  Filled 2021-02-18: qty 1

## 2021-02-18 MED ORDER — CHLORHEXIDINE GLUCONATE CLOTH 2 % EX PADS: 6.0000 | MEDICATED_PAD | Freq: Once | CUTANEOUS | Status: DC

## 2021-02-18 MED ORDER — HYDROMORPHONE HCL 1 MG/ML IJ SOLN
0.2500 mg | INTRAMUSCULAR | Status: DC | PRN
  Administered 2021-02-18 (×2): 0.25 mg via INTRAVENOUS
  Administered 2021-02-18: 0.5 mg via INTRAVENOUS

## 2021-02-18 MED ORDER — ENOXAPARIN SODIUM 30 MG/0.3ML IJ SOSY
30.0000 mg | PREFILLED_SYRINGE | Freq: Two times a day (BID) | INTRAMUSCULAR | Status: DC
  Administered 2021-02-18 – 2021-02-20 (×4): 30 mg via SUBCUTANEOUS
  Filled 2021-02-18 (×4): qty 0.3

## 2021-02-18 MED ORDER — SCOPOLAMINE 1 MG/3DAYS TD PT72
1.0000 | MEDICATED_PATCH | TRANSDERMAL | Status: DC
  Administered 2021-02-18: 1.5 mg via TRANSDERMAL
  Filled 2021-02-18: qty 1

## 2021-02-18 MED ORDER — PROPOFOL 10 MG/ML IV BOLUS
INTRAVENOUS | Status: DC | PRN
  Administered 2021-02-18: 200 mg via INTRAVENOUS

## 2021-02-18 MED ORDER — DEXAMETHASONE SODIUM PHOSPHATE 10 MG/ML IJ SOLN
INTRAMUSCULAR | Status: DC | PRN
  Administered 2021-02-18: 10 mg via INTRAVENOUS

## 2021-02-18 MED ORDER — SUCCINYLCHOLINE CHLORIDE 200 MG/10ML IV SOSY
PREFILLED_SYRINGE | INTRAVENOUS | Status: DC | PRN
  Administered 2021-02-18: 160 mg via INTRAVENOUS

## 2021-02-18 MED ORDER — ACETAMINOPHEN 500 MG PO TABS
1000.0000 mg | ORAL_TABLET | Freq: Three times a day (TID) | ORAL | Status: DC
  Administered 2021-02-18 – 2021-02-20 (×5): 1000 mg via ORAL
  Filled 2021-02-18 (×5): qty 2

## 2021-02-18 MED ORDER — ACETAMINOPHEN 160 MG/5ML PO SOLN: 1000.0000 mg | Freq: Three times a day (TID) | ORAL | Status: DC

## 2021-02-18 MED ORDER — HEPARIN SODIUM (PORCINE) 5000 UNIT/ML IJ SOLN
5000.0000 [IU] | INTRAMUSCULAR | Status: AC
  Administered 2021-02-18: 5000 [IU] via SUBCUTANEOUS
  Filled 2021-02-18: qty 1

## 2021-02-18 MED ORDER — APREPITANT 40 MG PO CAPS
40.0000 mg | ORAL_CAPSULE | ORAL | Status: AC
  Administered 2021-02-18: 40 mg via ORAL
  Filled 2021-02-18: qty 1

## 2021-02-18 MED ORDER — BUPIVACAINE LIPOSOME 1.3 % IJ SUSP
INTRAMUSCULAR | Status: DC | PRN
  Administered 2021-02-18: 20 mL

## 2021-02-18 MED ORDER — LORATADINE 10 MG PO TABS
10.0000 mg | ORAL_TABLET | Freq: Every day | ORAL | Status: DC
  Administered 2021-02-19: 10 mg via ORAL
  Filled 2021-02-18: qty 1

## 2021-02-18 MED ORDER — ACETAMINOPHEN 500 MG PO TABS
1000.0000 mg | ORAL_TABLET | ORAL | Status: AC
  Administered 2021-02-18: 1000 mg via ORAL
  Filled 2021-02-18: qty 2

## 2021-02-18 MED ORDER — LIDOCAINE 2% (20 MG/ML) 5 ML SYRINGE
INTRAMUSCULAR | Status: DC | PRN
  Administered 2021-02-18: 100 mg via INTRAVENOUS

## 2021-02-18 MED ORDER — EPHEDRINE 5 MG/ML INJ
INTRAVENOUS | Status: AC
  Filled 2021-02-18: qty 10

## 2021-02-18 MED ORDER — ONDANSETRON HCL 4 MG/2ML IJ SOLN
INTRAMUSCULAR | Status: AC
  Filled 2021-02-18: qty 2

## 2021-02-18 MED ORDER — DEXAMETHASONE SODIUM PHOSPHATE 4 MG/ML IJ SOLN: 4.0000 mg | INTRAMUSCULAR | Status: DC

## 2021-02-18 MED ORDER — HYDRALAZINE HCL 10 MG PO TABS
10.0000 mg | ORAL_TABLET | Freq: Three times a day (TID) | ORAL | Status: DC | PRN
  Filled 2021-02-18: qty 1

## 2021-02-18 MED ORDER — DILTIAZEM HCL 60 MG PO TABS
60.0000 mg | ORAL_TABLET | Freq: Every day | ORAL | Status: DC
  Administered 2021-02-18 – 2021-02-19 (×2): 60 mg via ORAL
  Filled 2021-02-18 (×2): qty 1

## 2021-02-18 MED ORDER — MIDAZOLAM HCL 2 MG/2ML IJ SOLN
INTRAMUSCULAR | Status: DC | PRN
  Administered 2021-02-18: 2 mg via INTRAVENOUS

## 2021-02-18 MED ORDER — FAMOTIDINE IN NACL 20-0.9 MG/50ML-% IV SOLN
20.0000 mg | Freq: Two times a day (BID) | INTRAVENOUS | Status: DC
  Administered 2021-02-18 – 2021-02-20 (×4): 20 mg via INTRAVENOUS
  Filled 2021-02-18 (×4): qty 50

## 2021-02-18 MED ORDER — MIDAZOLAM HCL 2 MG/2ML IJ SOLN
INTRAMUSCULAR | Status: AC
  Filled 2021-02-18: qty 2

## 2021-02-18 MED ORDER — HYDROMORPHONE HCL 1 MG/ML IJ SOLN
INTRAMUSCULAR | Status: AC
  Administered 2021-02-18: 0.5 mg
  Filled 2021-02-18: qty 1

## 2021-02-18 MED ORDER — FENTANYL CITRATE (PF) 100 MCG/2ML IJ SOLN
INTRAMUSCULAR | Status: DC | PRN
  Administered 2021-02-18 (×5): 50 ug via INTRAVENOUS

## 2021-02-18 MED ORDER — OXYCODONE HCL 5 MG/5ML PO SOLN
5.0000 mg | Freq: Four times a day (QID) | ORAL | Status: DC | PRN
  Administered 2021-02-18: 5 mg via ORAL
  Filled 2021-02-18: qty 5

## 2021-02-18 MED ORDER — LACTATED RINGERS IR SOLN
Status: DC | PRN
  Administered 2021-02-18: 1000 mL

## 2021-02-18 MED ORDER — ONDANSETRON HCL 4 MG/2ML IJ SOLN
INTRAMUSCULAR | Status: DC | PRN
  Administered 2021-02-18 (×2): 4 mg via INTRAVENOUS

## 2021-02-18 MED ORDER — 0.9 % SODIUM CHLORIDE (POUR BTL) OPTIME
TOPICAL | Status: DC | PRN
  Administered 2021-02-18: 1000 mL

## 2021-02-18 SURGICAL SUPPLY — 76 items
APL PRP STRL LF DISP 70% ISPRP (MISCELLANEOUS) ×4
APL SKNCLS STERI-STRIP NONHPOA (GAUZE/BANDAGES/DRESSINGS)
APPLIER CLIP 5 13 M/L LIGAMAX5 (MISCELLANEOUS)
APPLIER CLIP ROT 10 11.4 M/L (STAPLE)
APPLIER CLIP ROT 13.4 12 LRG (CLIP)
APR CLP LRG 13.4X12 ROT 20 MLT (CLIP)
APR CLP MED LRG 11.4X10 (STAPLE)
APR CLP MED LRG 5 ANG JAW (MISCELLANEOUS)
BENZOIN TINCTURE PRP APPL 2/3 (GAUZE/BANDAGES/DRESSINGS) IMPLANT
BLADE SURG SZ11 CARB STEEL (BLADE) ×3 IMPLANT
BNDG ADH 1X3 SHEER STRL LF (GAUZE/BANDAGES/DRESSINGS) IMPLANT
BNDG ADH THN 3X1 STRL LF (GAUZE/BANDAGES/DRESSINGS)
CABLE HIGH FREQUENCY MONO STRZ (ELECTRODE) IMPLANT
CHLORAPREP W/TINT 26 (MISCELLANEOUS) ×5 IMPLANT
CLIP APPLIE 5 13 M/L LIGAMAX5 (MISCELLANEOUS) IMPLANT
CLIP APPLIE ROT 10 11.4 M/L (STAPLE) IMPLANT
CLIP APPLIE ROT 13.4 12 LRG (CLIP) IMPLANT
COVER SURGICAL LIGHT HANDLE (MISCELLANEOUS) ×3 IMPLANT
COVER WAND RF STERILE (DRAPES) ×3 IMPLANT
DEVICE SUTURE ENDOST 10MM (ENDOMECHANICALS) ×3 IMPLANT
DRAIN CHANNEL 19F RND (DRAIN) IMPLANT
DRAIN PENROSE 0.25X18 (DRAIN) ×3 IMPLANT
ELECT L-HOOK LAP 45CM DISP (ELECTROSURGICAL) ×3
ELECT PENCIL ROCKER SW 15FT (MISCELLANEOUS) ×2 IMPLANT
ELECTRODE L-HOOK LAP 45CM DISP (ELECTROSURGICAL) ×2 IMPLANT
EVACUATOR SILICONE 100CC (DRAIN) IMPLANT
GAUZE 4X4 16PLY RFD (DISPOSABLE) ×3 IMPLANT
GAUZE SPONGE 4X4 12PLY STRL (GAUZE/BANDAGES/DRESSINGS) IMPLANT
GLOVE SURG POLYISO LF SZ7 (GLOVE) ×3 IMPLANT
GLOVE SURG UNDER POLY LF SZ7 (GLOVE) ×3 IMPLANT
GOWN STRL REUS W/TWL LRG LVL3 (GOWN DISPOSABLE) ×3 IMPLANT
GOWN STRL REUS W/TWL XL LVL3 (GOWN DISPOSABLE) ×9 IMPLANT
GRASPER SUT TROCAR 14GX15 (MISCELLANEOUS) ×2 IMPLANT
HANDLE STAPLE EGIA 4 XL (STAPLE) ×3 IMPLANT
KIT BASIN OR (CUSTOM PROCEDURE TRAY) ×3 IMPLANT
KIT GASTRIC LAVAGE 34FR ADT (SET/KITS/TRAYS/PACK) ×2 IMPLANT
KIT TURNOVER KIT A (KITS) ×3 IMPLANT
MARKER SKIN DUAL TIP RULER LAB (MISCELLANEOUS) ×3 IMPLANT
MAT PREVALON FULL STRYKER (MISCELLANEOUS) ×3 IMPLANT
NDL SPNL 22GX3.5 QUINCKE BK (NEEDLE) ×1 IMPLANT
NEEDLE SPNL 22GX3.5 QUINCKE BK (NEEDLE) ×3 IMPLANT
PACK CARDIOVASCULAR III (CUSTOM PROCEDURE TRAY) ×3 IMPLANT
PENCIL SMOKE EVACUATOR (MISCELLANEOUS) IMPLANT
RELOAD EGIA 45 MED/THCK PURPLE (STAPLE) ×9 IMPLANT
RELOAD EGIA 45 TAN VASC (STAPLE) IMPLANT
RELOAD EGIA 60 MED/THCK PURPLE (STAPLE) ×12 IMPLANT
RELOAD EGIA 60 TAN VASC (STAPLE) ×9 IMPLANT
RELOAD ENDO STITCH 2.0 (ENDOMECHANICALS) ×36
RELOAD STAPLE 60 MED/THCK ART (STAPLE) ×4 IMPLANT
RELOAD SUT SNGL STCH ABSRB 2-0 (ENDOMECHANICALS) ×5 IMPLANT
RELOAD SUT SNGL STCH BLK 2-0 (ENDOMECHANICALS) ×6 IMPLANT
SCISSORS LAP 5X45 EPIX DISP (ENDOMECHANICALS) ×3 IMPLANT
SET IRRIG TUBING LAPAROSCOPIC (IRRIGATION / IRRIGATOR) ×3 IMPLANT
SET TUBE SMOKE EVAC HIGH FLOW (TUBING) ×3 IMPLANT
SHEARS HARMONIC ACE PLUS 45CM (MISCELLANEOUS) ×3 IMPLANT
SLEEVE XCEL OPT CAN 5 100 (ENDOMECHANICALS) ×9 IMPLANT
SOL ANTI FOG 6CC (MISCELLANEOUS) ×2 IMPLANT
SOLUTION ANTI FOG 6CC (MISCELLANEOUS) ×1
STAPLER VISISTAT 35W (STAPLE) IMPLANT
STRIP CLOSURE SKIN 1/2X4 (GAUZE/BANDAGES/DRESSINGS) IMPLANT
SUT ETHIBOND 0 36 GRN (SUTURE) IMPLANT
SUT ETHILON 2 0 PS N (SUTURE) IMPLANT
SUT MNCRL AB 4-0 PS2 18 (SUTURE) ×3 IMPLANT
SUT RELOAD ENDO STITCH 2 48X1 (ENDOMECHANICALS) ×12
SUT RELOAD ENDO STITCH 2.0 (ENDOMECHANICALS) ×12
SUT SILK 0 SH 30 (SUTURE) IMPLANT
SUT VICRYL 0 TIES 12 18 (SUTURE) IMPLANT
SUTURE RELOAD END STTCH 2 48X1 (ENDOMECHANICALS) ×12 IMPLANT
SUTURE RELOAD ENDO STITCH 2.0 (ENDOMECHANICALS) ×12 IMPLANT
SYR 20ML LL LF (SYRINGE) ×3 IMPLANT
SYR 50ML LL SCALE MARK (SYRINGE) ×1 IMPLANT
TOWEL OR 17X26 10 PK STRL BLUE (TOWEL DISPOSABLE) ×3 IMPLANT
TOWEL OR NON WOVEN STRL DISP B (DISPOSABLE) ×3 IMPLANT
TROCAR BLADELESS OPT 5 100 (ENDOMECHANICALS) ×3 IMPLANT
TROCAR XCEL 12X100 BLDLESS (ENDOMECHANICALS) ×3 IMPLANT
TUBING CONNECTING 10 (TUBING) ×3 IMPLANT

## 2021-02-18 NOTE — Progress Notes (Signed)
PHARMACY CONSULT FOR:  Risk Assessment for Post-Discharge VTE Following Bariatric Surgery  Post-Discharge VTE Risk Assessment: This patient's probability of 30-day post-discharge VTE is increased due to the factors marked: x  Male    Age >/=60 years    BMI >/=50 kg/m2    CHF    Dyspnea at Rest    Paraplegia   x Non-gastric-band surgery   x Operation Time >/=3 hr    Return to OR     Length of Stay >/= 3 d   Hx of VTE   Hypercoagulable condition   Significant venous stasis       Predicted probability of 30-day post-discharge VTE: 0.48%  Other patient-specific factors to consider:   Recommendation for Discharge: Enoxaparin 40 mg [place] q12h x 2 weeks post-discharge    Hector Curry is a 60 y.o. 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  Recent Labs    02/18/21 1435  HGB 16.0  HCT 50.3   Estimated Creatinine Clearance: 133.8 mL/min (by C-G formula based on SCr of 0.9 mg/dL).    Past Medical History:  Diagnosis Date   Allergic rhinitis    Atrial fibrillation (HCC)    Coronary artery disease, non-occlusive    a. 05/08/2015 Cath: no significant CAD, LVEF nl-->Med; b. 07/2017 MV: attenuation artifact, no ischemia, EF 65%-->Low risk.   Diabetes mellitus without complication (HCC)    Diastolic dysfunction    a. 04/2015 Echo: EF 60-65%, mild MR, mildly dil LA, nl RV fxn, nl PASP; b. 05/2016 Echo: EF 60-65%, no rmwa, Gr1 DD, mildly dil Ao root and asc Ao; c. 07/2017 Echo: EF 55-60%, Ao root 65mm. mildly dil LA; d. 03/2019 Echo: EF 55-60%, nl RV fxn. Mild LAE, Ao root/Asc Ao 81mm.   Dilated aortic root (HCC)    a. 07/2017 Echo: 42mm Ao root - mildly dil; b. 03/2019 Echo: Ao root 75mm.    Diverticulosis 10/03/2014   Dysrhythmia    Family history of premature CAD    a. father passed from MI at 36   GERD (gastroesophageal reflux disease)    Hemorrhoids    a. internal hemorrhoids s/p surgery 1999   History of kidney stones    History of tobacco abuse    Hyperlipidemia    Hyperplastic colon polyp 10/03/2014   a. x 2    Hypertension    Inflammatory arthritis    a. CCP antibodies & x-rays negative. Rheumatoid factor 14, felt to be crystaline over RA or psoriatic   Morbid obesity (HCC)    a. s/p LAP-BAND   OSA (obstructive sleep apnea)    a. on CPAP   Osteoarthritis    PSVT (paroxysmal supraventricular tachycardia) (HCC)    a. 48 hr Holter 04/2015: NSR w/ rare PVC, short runs of narrow complex tachycardiac, possible atrial tach, longest run 7 beats, PACs noted (2% of all beats 3600 total) they did not seem to correlate w/ significant arrythmia; b. 08/2017 Event monitor: no significant arrhythmias.     Medications Prior to Admission  Medication Sig Dispense Refill Last Dose   aspirin 81 MG tablet Take 81 mg by mouth daily.   Past Month   bisoprolol-hydrochlorothiazide [facility]) 10-6.25  MG tablet TAKE 1 TABLET BY MOUTH ONCE DAILY 90 tablet 3 02/17/2021   cetirizine (ZYRTEC) 10 MG tablet Take 10 mg by mouth daily.   Past Month   cholecalciferol (VITAMIN D) 1000 UNITS tablet Take 1,000 Units by mouth daily.    Past Month   diltiazem (CARDIZEM) 60 MG tablet Take 1 tablet (60 mg total) by mouth every 8 (eight) hours. (Patient taking differently: Take 60 mg by mouth at bedtime.) 270 tablet 1 02/18/2021   docusate sodium (COLACE) 100 MG capsule Take 100 mg by mouth at bedtime.   Past Month   fenofibrate (TRICOR) 145 MG tablet Take 145 mg by mouth daily.    02/18/2021   Flaxseed, Linseed, (FLAXSEED OIL) 1200 MG CAPS Take 1,200 mg by mouth daily.   Past Month   fluticasone (FLONASE) 50 MCG/ACT nasal spray Place 1 spray into both nostrils daily as needed for allergies or rhinitis.   Past  Month   magnesium gluconate (MAGONATE) 500 MG tablet Take 500 mg by mouth daily.   Past Month   metFORMIN (GLUCOPHAGE) 850 MG tablet Take 1 tablet (850 mg total) by mouth 2 (two) times daily. 180 tablet 1 02/18/2021   OVER THE COUNTER MEDICATION Place 1 application into both ears. Mometasone flroate topical solution 0.1 % once per month   Past Month   pioglitazone (ACTOS) 30 MG tablet Take 1 tablet (30 mg total) by mouth daily.   02/18/2021   potassium chloride SA (KLOR-CON) 20 MEQ tablet TAKE 1 TABLET BY MOUTH TWO TIMES DAILY (Patient taking differently: Take 20 mEq by mouth 2 (two) times daily.) 180 tablet 3 02/18/2021   propranolol (INDERAL) 20 MG tablet TAKE 1 TABLET BY MOUTH 3  TIMES DAILY AS NEEDED (Patient taking differently: Take 20 mg by mouth daily as needed (Afib).) 270 tablet 2 02/17/2021 at 0300   Propylene Glycol (SYSTANE BALANCE OP) Place 1 drop into both eyes 2 (two) times a week.   Past Month   QUERCETIN PO Take 500 mg by mouth daily at 8 pm.   Past Month   Turmeric 500 MG CAPS Take 1,000 mg by mouth daily.   Past Month   zinc gluconate 50 MG tablet Take 50 mg by mouth daily.   Past Month   SODIUM FLUORIDE 5000 SENSITIVE 1.1-5 % GEL 1 application by Mouth Rinse route See admin instructions.          Hector Curry 02/18/2021,6:01 PM

## 2021-02-18 NOTE — Anesthesia Procedure Notes (Signed)
Procedure Name: Intubation Date/Time: 02/18/2021 9:42 AM Performed by: Pearson Grippe, CRNA Pre-anesthesia Checklist: Patient identified, Emergency Drugs available, Suction available and Patient being monitored Patient Re-evaluated:Patient Re-evaluated prior to induction Oxygen Delivery Method: Circle system utilized Preoxygenation: Pre-oxygenation with 100% oxygen Induction Type: IV induction Ventilation: Mask ventilation without difficulty Laryngoscope Size: Miller and 2 Grade View: Grade II Tube type: Oral Tube size: 7.5 mm Number of attempts: 1 Airway Equipment and Method: Stylet and Oral airway Placement Confirmation: ETT inserted through vocal cords under direct vision, positive ETCO2 and breath sounds checked- equal and bilateral Secured at: 23 cm Tube secured with: Tape Dental Injury: Teeth and Oropharynx as per pre-operative assessment

## 2021-02-18 NOTE — H&P (Signed)
Hector Curry is an 60 y.o. male.   Chief Complaint: obesity HPI: 60 yo male with morbid obesity with several co-morbidities. He had a band in the past and now presents for conversion to bypass.  Past Medical History:  Diagnosis Date   Allergic rhinitis    Atrial fibrillation (HCC)    Coronary artery disease, non-occlusive    a. 05/08/2015 Cath: no significant CAD, LVEF nl-->Med; b. 07/2017 MV: attenuation artifact, no ischemia, EF 65%-->Low risk.   Diabetes mellitus without complication (HCC)    Diastolic dysfunction    a. 04/2015 Echo: EF 60-65%, mild MR, mildly dil LA, nl RV fxn, nl PASP; b. 05/2016 Echo: EF 60-65%, no rmwa, Gr1 DD, mildly dil Ao root and asc Ao; c. 07/2017 Echo: EF 55-60%, Ao root 70mm. mildly dil LA; d. 03/2019 Echo: EF 55-60%, nl RV fxn. Mild LAE, Ao root/Asc Ao 66mm.   Dilated aortic root (HCC)    a. 07/2017 Echo: 38mm Ao root - mildly dil; b. 03/2019 Echo: Ao root 94mm.   Diverticulosis 10/03/2014   Dysrhythmia    Family history of premature CAD    a. father passed from MI at 20   GERD (gastroesophageal reflux disease)    Hemorrhoids    a. internal hemorrhoids s/p surgery 1999   History of kidney stones    History of tobacco abuse    Hyperlipidemia    Hyperplastic colon polyp 10/03/2014   a. x 2    Hypertension    Inflammatory arthritis    a. CCP antibodies & x-rays negative. Rheumatoid factor 14, felt to be crystaline over RA or psoriatic   Morbid obesity (HCC)    a. s/p LAP-BAND   OSA (obstructive sleep apnea)    a. on CPAP   Osteoarthritis    PSVT (paroxysmal supraventricular tachycardia) (HCC)    a. 48 hr Holter 04/2015: NSR w/ rare PVC, short runs of narrow complex tachycardiac, possible atrial tach, longest run 7 beats, PACs noted (2% of all beats 3600 total) they did not seem to correlate w/ significant arrythmia; b. 08/2017 Event monitor: no significant arrhythmias.    Past Surgical History:  Procedure Laterality Date   BARIATRIC SURGERY      lap band    CARDIAC CATHETERIZATION N/A 05/08/2015   Procedure: Left Heart Cath and Coronary Angiography;  Surgeon: Corky Crafts, MD;  Location: [facility] INVASIVE CV LAB;  Service: Cardiovascular;  Laterality: N/A;   CARPAL TUNNEL RELEASE Left    CHOLECYSTECTOMY     COLONOSCOPY  06/27/2005   COLONOSCOPY  10/03/2014   ESOPHAGOGASTRODUODENOSCOPY  06/27/2005   HEMORRHOID SURGERY  08/25/1997   MOUTH SURGERY     removed area which was benign   TONSILLECTOMY      Family History  Problem Relation Age of Onset   Hypertension Mother    Diabetes Mother    Heart attack Father 103   CAD Father    Hypertension Sister    Diabetes Other    Hypertension Other    Heart disease Other    Social History:  reports that he quit smoking about 31 years ago. His smoking use included cigarettes. He has a 15.00 pack-year smoking history. He has never used smokeless tobacco. He reports that he does not drink alcohol and does not use drugs.  Allergies:  Allergies  Allergen Reactions   Other Other (See Comments)    Cats    Medications Prior to Admission  Medication Sig Dispense Refill   aspirin 81 MG  tablet Take 81 mg by mouth daily.     bisoprolol-hydrochlorothiazide (ZIAC) 10-6.25 MG tablet TAKE 1 TABLET BY MOUTH ONCE DAILY 90 tablet 3   cetirizine (ZYRTEC) 10 MG tablet Take 10 mg by mouth daily.     cholecalciferol (VITAMIN D) 1000 UNITS tablet Take 1,000 Units by mouth daily.      diltiazem (CARDIZEM) 60 MG tablet Take 1 tablet (60 mg total) by mouth every 8 (eight) hours. (Patient taking differently: Take 60 mg by mouth at bedtime.) 270 tablet 1   docusate sodium (COLACE) 100 MG capsule Take 100 mg by mouth at bedtime.     fenofibrate (TRICOR) 145 MG tablet Take 145 mg by mouth daily.      Flaxseed, Linseed, (FLAXSEED OIL) 1200 MG CAPS Take 1,200 mg by mouth daily.     fluticasone (FLONASE) 50 MCG/ACT nasal spray Place 1 spray into both nostrils daily as needed for allergies or rhinitis.      magnesium gluconate (MAGONATE) 500 MG tablet Take 500 mg by mouth daily.     metFORMIN (GLUCOPHAGE) 850 MG tablet Take 1 tablet (850 mg total) by mouth 2 (two) times daily. 180 tablet 1   OVER THE COUNTER MEDICATION Place 1 application into both ears. Mometasone flroate topical solution 0.1 % once per month     pioglitazone (ACTOS) 30 MG tablet Take 1 tablet (30 mg total) by mouth daily.     potassium chloride SA (KLOR-CON) 20 MEQ tablet TAKE 1 TABLET BY MOUTH TWO TIMES DAILY (Patient taking differently: Take 20 mEq by mouth 2 (two) times daily.) 180 tablet 3   propranolol (INDERAL) 20 MG tablet TAKE 1 TABLET BY MOUTH 3  TIMES DAILY AS NEEDED (Patient taking differently: Take 20 mg by mouth daily as needed (Afib).) 270 tablet 2   Propylene Glycol (SYSTANE BALANCE OP) Place 1 drop into both eyes 2 (two) times a week.     QUERCETIN PO Take 500 mg by mouth daily at 8 pm.     Turmeric 500 MG CAPS Take 1,000 mg by mouth daily.     zinc gluconate 50 MG tablet Take 50 mg by mouth daily.     SODIUM FLUORIDE 5000 SENSITIVE 1.1-5 % GEL 1 application by Mouth Rinse route See admin instructions.      Results for orders placed or performed during the hospital encounter of 02/18/21 (from the past 48 hour(s))  Glucose, capillary     Status: Abnormal   Collection Time: 02/18/21  8:01 AM  Result Value Ref Range   Glucose-Capillary 145 (H) 70 - 99 mg/dL    Comment: Glucose reference range applies only to samples taken after fasting for at least 8 hours.   Comment 1 Notify RN   ABO/Rh     Status: None (Preliminary result)   Collection Time: 02/18/21  8:18 AM  Result Value Ref Range   ABO/RH(D) PENDING    No results found.  Review of Systems  Constitutional:  Negative for chills and fever.  HENT:  Negative for hearing loss.   Respiratory:  Negative for cough.   Cardiovascular:  Negative for chest pain and palpitations.  Gastrointestinal:  Negative for abdominal pain, nausea and vomiting.  Genitourinary:   Negative for dysuria and urgency.  Musculoskeletal:  Negative for myalgias and neck pain.  Skin:  Negative for rash.  Neurological:  Negative for dizziness and headaches.  Hematological:  Does not bruise/bleed easily.  Psychiatric/Behavioral:  Negative for suicidal ideas.    Blood pressure Marland Kitchen)  151/89, pulse 64, temperature 98 F (36.7 C), temperature source Oral, resp. rate 18, height 6\' 2"  (1.88 m), weight (!) 140.9 kg, SpO2 97 %. Physical Exam Vitals reviewed.  Constitutional:      Appearance: He is well-developed.  HENT:     Head: Normocephalic and atraumatic.  Eyes:     Conjunctiva/sclera: Conjunctivae normal.     Pupils: Pupils are equal, round, and reactive to light.  Cardiovascular:     Rate and Rhythm: Normal rate and regular rhythm.  Pulmonary:     Effort: Pulmonary effort is normal.     Breath sounds: Normal breath sounds.  Abdominal:     General: Bowel sounds are normal. There is no distension.     Palpations: Abdomen is soft.     Tenderness: There is no abdominal tenderness.  Musculoskeletal:        General: Normal range of motion.     Cervical back: Normal range of motion and neck supple.  Skin:    General: Skin is warm and dry.  Neurological:     Mental Status: He is alert and oriented to person, place, and time.  Psychiatric:        Behavior: Behavior normal.     Assessment/Plan 60 yo male with band and obesity. -lap band removal with conversion to RNY gastric bypass -ERAS protocol -inpatient admission  41, MD 02/18/2021, 8:49 AM

## 2021-02-18 NOTE — Transfer of Care (Signed)
Immediate Anesthesia Transfer of Care Note  Patient: Hector Curry  Procedure(s) Performed: LAPAROSCOPIC ROUX-EN-Y GASTRIC BYPASS WITH UPPER ENDOSCOPY, (Abdomen) REMOVAL OF LAPBAND (Abdomen)  Patient Location: PACU  Anesthesia Type:General  Level of Consciousness: awake, alert  and oriented  Airway & Oxygen Therapy: Patient Spontanous Breathing and Patient connected to face mask oxygen  Post-op Assessment: Report given to RN and Post -op Vital signs reviewed and stable  Post vital signs: Reviewed and stable  Last Vitals:  Vitals Value Taken Time  BP 132/78   Temp    Pulse 71 02/18/21 1423  Resp 21 02/18/21 1423  SpO2 99 % 02/18/21 1423  Vitals shown include unvalidated device data.  Last Pain:  Vitals:   02/18/21 0824  TempSrc:   PainSc: 0-No pain         Complications: No notable events documented.

## 2021-02-18 NOTE — Anesthesia Postprocedure Evaluation (Signed)
Anesthesia Post Note  Patient: Hector Curry  Procedure(s) Performed: LAPAROSCOPIC ROUX-EN-Y GASTRIC BYPASS WITH UPPER ENDOSCOPY, (Abdomen) REMOVAL OF LAPBAND (Abdomen)     Patient location during evaluation: PACU Anesthesia Type: General Level of consciousness: awake Pain management: pain level controlled Vital Signs Assessment: post-procedure vital signs reviewed and stable Respiratory status: spontaneous breathing Cardiovascular status: stable Postop Assessment: no apparent nausea or vomiting Anesthetic complications: no   No notable events documented.  Last Vitals:  Vitals:   02/18/21 0810  BP: (!) 151/89  Pulse: 64  Resp: 18  Temp: 36.7 C  SpO2: 97%    Last Pain:  Vitals:   02/18/21 0824  TempSrc:   PainSc: 0-No pain                 See [place]

## 2021-02-18 NOTE — Progress Notes (Signed)
Saw pt while recovering in PACU. Discussed QI "Goals for Discharge" document with patient including ambulation in halls, Incentive Spirometry use every hour, and oral care.  Also discussed pain and nausea control.  Enabled or verified head of bed 30 degree alarm activated.  BSTOP education provided including BSTOP information guide, "Guide for Pain Management after your Bariatric Procedure".  Diet progression education provided including "Bariatric Surgery Post-Op Food Plan Phase 1: Liquids".  Questions answered.  Will continue to partner with bedside RN and follow up with patient per protocol. Talked with admitting RN to share updates from Dr. Sheliah Hatch.  Pt is aware to only consume water today and need to void.

## 2021-02-18 NOTE — Anesthesia Postprocedure Evaluation (Signed)
Anesthesia Post Note  Patient: Hector Curry  Procedure(s) Performed: LAPAROSCOPIC ROUX-EN-Y GASTRIC BYPASS WITH UPPER ENDOSCOPY, (Abdomen) REMOVAL OF LAPBAND (Abdomen)     Patient location during evaluation: PACU Anesthesia Type: General Level of consciousness: awake Pain management: pain level controlled Vital Signs Assessment: post-procedure vital signs reviewed and stable Respiratory status: spontaneous breathing Cardiovascular status: stable Postop Assessment: no apparent nausea or vomiting Anesthetic complications: no   No notable events documented.  Last Vitals:  Vitals:   02/18/21 1515 02/18/21 1530  BP: 140/69 (!) 145/75  Pulse: 70 66  Resp: 20 15  Temp:    SpO2: 94% 95%    Last Pain:  Vitals:   02/18/21 1530  TempSrc:   PainSc: 2                  Aleera Gilcrease

## 2021-02-18 NOTE — Progress Notes (Signed)
   Patient: Hector Curry (Jun 29, 1961, [id number])  Date of Surgery: 02/18/2021   Preoperative Diagnosis: MORBID OBESITY   Postoperative Diagnosis: MORBID OBESITY   Surgical Procedure: Upper Endoscopy   Surgeon: Ivar Drape, MD  Anesthesiologist: Dorris Singh, MD CRNA: Elisabeth Cara, CRNA; Epimenio Sarin, CRNA; Pearson Grippe, CRNA   Anesthesia: General   Fluids:  Total I/O In: 1100 [I.V.:1000; IV Piggyback:100] Out: -   Complications: None  Drains:  None  Specimen: None   Indications for Procedure: [name] is a 60 y.o. male undergoing robotic removal of lap band and conversion to gastric bypass and an EGD was requested to evaluate foregut anatomy intraoperatively.  Description of Procedure: During the procedure, I scrubbed out and obtained the Olympus endoscope. I gently placed endoscope in the patient's oropharynx and gently glided it down the esophagus without any difficulty under direct visualization.  The scope was advanced as far as the roux limb and then slowly withdrawn to inspect the foregut anatomy.  Dr. Sheliah Hatch had placed saline in the upper abdomen and all staple lines were submerged to ensure no air leak. There was bubbling from the anterior aspect of the incision.  A stitch was placed and the anastomosis was tested again and then there were no more bubbles confirming a negative leak test.  There was no evidence of intraluminal bleeding and the mucosa appeared healthy.  The lumen was widely patent without evidence of stricture.  The intraluminal insufflation was decompressed. The scope was withdrawn. The patient tolerated this portion of the procedure well. Please see Dr Guerry Minors operative note for details regarding the remainder of the procedure.    Ivar Drape, MD General, Bariatric, & Minimally Invasive Surgery [facility], [place]

## 2021-02-18 NOTE — Op Note (Signed)
Preop Diagnosis: Obesity Class III  Postop Diagnosis: same  Procedure performed: laparoscopic Roux en Y gastric bypass, laparoscopic removal of adjustable gastric band and port  Assitant: Ivar Drape  Indications:  The patient is a 60 y.o. year-old morbidly obese male who has been followed in the Bariatric Clinic as an outpatient. This patient was diagnosed with morbid obesity with a BMI of Body mass index is 39.88 kg/m. and significant co-morbidities including hypertension and insulin dependent diabetes.  The patient was counseled extensively in the Bariatric Outpatient Clinic and after a thorough explanation of the risks and benefits of surgery (including death from complications, bowel leak, infection such as peritonitis and/or sepsis, internal hernia, bleeding, need for blood transfusion, bowel obstruction, organ failure, pulmonary embolus, deep venous thrombosis, wound infection, incisional hernia, skin breakdown, and others entailed on the consent form) and after a compliant diet and exercise program, the patient was scheduled for an elective laparoscopic gastric bypass.  Description of Operation:  Following informed consent, the patient was taken to the operating room and placed on the operating table in the supine position.  He had previously received prophylactic antibiotics and subcutaneous heparin for DVT prophylaxis in the pre-op holding area.  After induction of general endotracheal anesthesia by the anesthesiologist, the patient underwent placement of sequential compression devices, and an oro-gastric tube.  A timeout was confirmed by the surgery and anesthesia teams.  The patient was adequately padded at all pressure points and placed on a footboard to prevent slippage from the OR table during extremes of position during surgery.  He underwent a routine sterile prep and drape of her entire abdomen.    Next, A transverse incision was made under the left subcostal area and a 73mm  optical viewing trocar was introduced into the peritoneal cavity. Pneumoperitoneum was applied with a high flow and low pressure. A laparoscope was inserted to confirm placement. A extraperitoneal block was then placed at the lateral abdominal wall using exparel diluted with marcaine . 5 additional trocars were placed: 1 12mm trocar to the left of the midline. 1 additional 77mm trocar in the left lateral area, 1 54mm trocar in the right mid abdomen, and 1 25mm trocar in the right subcostal area.   A Nathanson retracted was placed through a subxiphoid incision and used to retract the liver. The band tubing was seen in the mid abdomen with adhesions around it. These were taken down with harmonic scalpel. The liver was fibrotic but not large. The liver was dissected free of the stomach and the realize was identified. The band was removed. The cicatrix was divided.   Next, a position along the lesser curve 6cm from GE junction was identified. The pars flaccida was entered and the fat over the lesser curve divided to enter the lesser sac. Multiple 69mm 3-54mm tristaple firings were peformed to create a 6cm pouch. Additional mobilization around the crus was performed. Due to scar no hiatal hernia was repaired.  The greater omentum was flipped over the transverse colon and under the left lobe of the liver. The ligament of trietz was identified. 40cm of jejunum was measured starting from the ligament of Trietz. The mesentery was checked to ensure mobility. Next, a 51mm 2-24mm tristapler was used to divide the jejunum at this location. The harmonic scalpel was used to divide the mesentery down to the origin. A 1/2" penrose was sutured to the distal side. 100cm of jejunum was measured starting at the division. 2-0 silk was used to appose the  biliary limb to the 100cm mark of jejunum in 2 places. Enterotomies were made in the biliary and common channels and a 36mm 2-3 tristapler was used to create the J-J anastomosis. A 2-0  silk was used to appose the enterotomy edges and a 39mm 2-3 tristapler was used to close the enterotomy. An anti-obstruction 2-0 silk suture was placed. Next, the mesenteric defect was closed with a 2-0 silk in running fashion.The J-J appeared patent and in neutral position.  Next, the omentum was divided using the Harmonic scalpel. The patient was placed in steep Reverse Trendelenberg position.  The Roux limb was identified using the placed penrose and brought up to the stomach in antecolic fashion. The limb was inspected to ensure a neutral position. There was difficulty in getting the limb to reach due to retroperitoneal fat. Additional mesentery bites were taken with harmonic scalpel for length. A 2-0 vicryl suture was then used to create a posterior layer connecting the stomach to the Roux limb jejunum in running fashion. Next cautery was used to create an enterotomy along the medial aspect of this suture line and Harmonic scalpel used to create gastotomy. A 19mm 3-74mm tristapler was then used to create a 25-58mm anastomosis. 2 2-0 vicryl sutures were used in running fashion to close the gastrotomy. Because there was difficulty determining the mucosa, an endoscopy was performed and placed across the anastomosis. Finally, a 2-0 vicryl suture was used to close an anterior layer of stomach and jejunum over the anastomosis in running fashion. The penrose was removed from the Roux limb. A 2-0 silk was used to appose the transverse mesocolon to the mesentery of the Roux limb.   The assistant then went and performed an upper endoscopy and leak test. At first bubbles were seen in the anterior distal pouch which was repaired with 3-0 vicryl. Next, no bubbles were seen and the pouch and limb distended appropriately. The limb and pouch were deflated, the endoscope was removed. Hemostasis was ensured. Pneumoperitoneum was evacuated, all ports were removed and all incisions closed with 4-0 monocryl suture in  subcuticular fashion. Steristrips and bandaids were put in place for dressing. The patient awoke from anesthesia and was brought to pacu in stable condition. All counts were correct.  Specimens:  None  Estimated Blood Loss: 50 ml  Local Anesthesia: 50 ml Exparel: 0.5% Marcaine Mix  Post-Op Plan:       Pain Management: PO, prn      Antibiotics: Prophylactic      Anticoagulation: Prophylactic, Starting now      Post Op Studies/Consults: Not applicable      Intended Discharge: within 48h      Intended Outpatient Follow-Up: Two Week      Intended Outpatient Studies: Not Applicable      Other: Not Applicable   De Blanch Daila Elbert

## 2021-02-19 ENCOUNTER — Encounter (HOSPITAL_COMMUNITY): Payer: Self-pay | Admitting: General Surgery

## 2021-02-19 LAB — COMPREHENSIVE METABOLIC PANEL
ALT: 142 U/L — ABNORMAL HIGH (ref 0–44)
AST: 85 U/L — ABNORMAL HIGH (ref 15–41)
Albumin: 4.4 g/dL (ref 3.5–5.0)
Alkaline Phosphatase: 50 U/L (ref 38–126)
Anion gap: 9 (ref 5–15)
BUN: 17 mg/dL (ref 6–20)
CO2: 27 mmol/L (ref 22–32)
Calcium: 9.3 mg/dL (ref 8.9–10.3)
Chloride: 100 mmol/L (ref 98–111)
Creatinine, Ser: 1.03 mg/dL (ref 0.61–1.24)
GFR, Estimated: >60 mL/min (ref >60)
Glucose, Bld: 261 mg/dL — ABNORMAL HIGH (ref 70–99)
Potassium: 4.1 mmol/L (ref 3.5–5.1)
Sodium: 136 mmol/L (ref 135–145)
Total Bilirubin: 1.1 mg/dL (ref 0.3–1.2)
Total Protein: 8 g/dL (ref 6.5–8.1)

## 2021-02-19 LAB — CBC WITH DIFFERENTIAL/PLATELET
Abs Immature Granulocytes: 0.08 10*3/uL — ABNORMAL HIGH (ref 0.00–0.07)
Basophils Absolute: 0 10*3/uL (ref 0.0–0.1)
Basophils Relative: 0 %
Eosinophils Absolute: 0 10*3/uL (ref 0.0–0.5)
Eosinophils Relative: 0 %
HCT: 47.1 % (ref 39.0–52.0)
Hemoglobin: 15.1 g/dL (ref 13.0–17.0)
Immature Granulocytes: 1 %
Lymphocytes Relative: 6 %
Lymphs Abs: 0.8 10*3/uL (ref 0.7–4.0)
MCH: 28.8 pg (ref 26.0–34.0)
MCHC: 32.1 g/dL (ref 30.0–36.0)
MCV: 89.9 fL (ref 80.0–100.0)
Monocytes Absolute: 1.2 10*3/uL — ABNORMAL HIGH (ref 0.1–1.0)
Monocytes Relative: 9 %
Neutro Abs: 11.5 10*3/uL — ABNORMAL HIGH (ref 1.7–7.7)
Neutrophils Relative %: 84 %
Platelets: 228 10*3/uL (ref 150–400)
RBC: 5.24 MIL/uL (ref 4.22–5.81)
RDW: 14.6 % (ref 11.5–15.5)
WBC: 13.5 10*3/uL — ABNORMAL HIGH (ref 4.0–10.5)
nRBC: 0 % (ref 0.0–0.2)

## 2021-02-19 LAB — GLUCOSE, CAPILLARY
Glucose-Capillary: 263 mg/dL — ABNORMAL HIGH (ref 70–99)
Glucose-Capillary: 245 mg/dL — ABNORMAL HIGH (ref 70–99)
Glucose-Capillary: 191 mg/dL — ABNORMAL HIGH (ref 70–99)
Glucose-Capillary: 163 mg/dL — ABNORMAL HIGH (ref 70–99)
Glucose-Capillary: 155 mg/dL — ABNORMAL HIGH (ref 70–99)

## 2021-02-19 MED ORDER — SODIUM CHLORIDE 0.45 % IV SOLN: INTRAVENOUS | Status: DC

## 2021-02-19 MED ORDER — ENSURE MAX PROTEIN PO LIQD
11.0000 [oz_av] | Freq: Every day | ORAL | Status: DC
  Administered 2021-02-19 – 2021-02-20 (×2): 11 [oz_av] via ORAL

## 2021-02-19 NOTE — Discharge Instructions (Signed)

## 2021-02-19 NOTE — Progress Notes (Signed)
Nutrition Education Note ° °Received consult for diet education for patient s/p bariatric surgery. ° °Discussed 2 week post op diet with pt. Emphasized that liquids must be non carbonated, non caffeinated, and sugar free. Fluid goals discussed. Pt to follow up with outpatient bariatric RD for further diet progression after 2 weeks. Multivitamins and minerals also reviewed. Teach back method used, pt expressed understanding, expect good compliance. ° °If nutrition issues arise, please consult RD. ° °Emanuel Dowson, MS, RD, LDN °Inpatient Clinical Dietitian °Contact information available via Amion ° ° °

## 2021-02-19 NOTE — Progress Notes (Signed)
Progress Note: Metabolic and Bariatric Surgery Service   Chief Complaint/Subjective: Minimal pain, tolerating water well, ambulating  Objective: Vital signs in last 24 hours: Temp:  [97.5 F (36.4 C)-98.5 F (36.9 C)] 98.5 F (36.9 C) (06/28 0627) Pulse Rate:  [57-71] 57 (06/28 1345) Resp:  [16-20] 17 (06/28 1345) BP: (122-159)/(73-91) 139/75 (06/28 1345) SpO2:  [90 %-96 %] 96 % (06/28 1345) Last BM Date: 02/19/21  Intake/Output from previous day: 06/27 0701 - 06/28 0700 In: 3369.8 [P.O.:240; I.V.:2980.7; IV Piggyback:149.1] Out: 1665 [Urine:1500; Drains:145; Blood:20] Intake/Output this shift: Total I/O In: 1893.8 [P.O.:360; I.V.:1036; IV Piggyback:497.8] Out: 1020 [Urine:1000; Drains:20]  Lungs: nonlabored  Cardiovascular: RRR  Abd: soft, NT, incisions c/d/i  Extremities: no edema  Neuro: AOx4  Lab Results: CBC  Recent Labs    02/18/21 1435 02/19/21 0352  WBC  --  13.5*  HGB 16.0 15.1  HCT 50.3 47.1  PLT  --  228   BMET Recent Labs    02/19/21 0352  NA 136  K 4.1  CL 100  CO2 27  GLUCOSE 261*  BUN 17  CREATININE 1.03  CALCIUM 9.3   PT/INR No results for input(s): LABPROT, INR in the last 72 hours. ABG No results for input(s): PHART, HCO3 in the last 72 hours.  Invalid input(s): PCO2, PO2  Studies/Results:  Anti-infectives: Anti-infectives (From admission, onward)    Start     Dose/Rate Route Frequency Ordered Stop   02/18/21 0600  cefoTEtan (CEFOTAN) 2 g in sodium chloride 0.9 % 100 mL IVPB        2 g 200 mL/hr over 30 Minutes Intravenous On call to O.R. 02/17/21 0658 02/18/21 1020       Medications: Scheduled Meds:  acetaminophen  1,000 mg Oral Q8H   Or   acetaminophen (TYLENOL) oral liquid 160 mg/5 mL  1,000 mg Oral Q8H   bisoprolol-hydrochlorothiazide  1 tablet Oral Daily   diltiazem  60 mg Oral QHS   enoxaparin (LOVENOX) injection  30 mg Subcutaneous Q12H   insulin aspart  0-20 Units Subcutaneous Q4H   loratadine  10  mg Oral Daily   Ensure Max Protein  11 oz Oral Daily   Continuous Infusions:  sodium chloride 125 mL/hr at 02/19/21 0906   dextrose 5 % and 0.45% NaCl 125 mL/hr at 02/19/21 0245   famotidine (PEPCID) IV 20 mg (02/19/21 0906)   PRN Meds:.hydrALAZINE, morphine injection, ondansetron (ZOFRAN) IV, oxyCODONE, propranolol, simethicone  Assessment/Plan: Patient Active Problem List   Diagnosis Date Noted   Morbid obesity (HCC) 02/18/2021   History of kidney stones 05/30/2016   Elevated LFTs 02/13/2016   Morbid obesity with BMI of 40.0-44.9, adult (HCC) 12/20/2015   Atrial tachycardia (HCC) 06/21/2015   Hypertriglyceridemia 05/15/2015   Coronary artery disease, non-occlusive    Type II diabetes mellitus, uncontrolled (HCC) 05/08/2015   Chest pain at rest    Hyperglycemia 05/07/2015   Chest pain 05/06/2015   GERD (gastroesophageal reflux disease)    Kidney stones    Hemorrhoids    Inflammatory arthritis    Family history of premature CAD    OSA (obstructive sleep apnea)    History of tobacco abuse    Palpitations    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/20/2014   Diverticulosis 10/03/2014   Hyperplastic colon polyp 10/03/2014   HTN, goal below 140/90 08/07/2014   Pain in the chest 06/19/2014   SOB (shortness of breath) 06/19/2014   Obesity 06/19/2014   Hyperlipidemia 06/19/2014   s/p Procedure(s): LAPAROSCOPIC  ROUX-EN-Y GASTRIC BYPASS WITH UPPER ENDOSCOPY, REMOVAL OF LAPBAND 02/18/2021 -advance to protein -remove d5 from IV fluid -ambulate  Disposition:  LOS: 1 day  The patient will be in the hospital for normal postop protocol  Rodman Pickle, MD [phone] [facility]

## 2021-02-19 NOTE — Progress Notes (Signed)
Patient alert and oriented, pain is controlled. Patient is tolerating fluids, advanced to protein shake today, patient is tolerating well. Reviewed Gastric Bypass discharge instructions with patient and patient is able to articulate understanding. Provided information on BELT program, Support Group and [facility] outpatient pharmacy. All questions answered, will continue to monitor.  RN to instruct pt and caregiver on JP drain and Lovenox injections.

## 2021-02-20 ENCOUNTER — Other Ambulatory Visit (HOSPITAL_COMMUNITY): Payer: Self-pay

## 2021-02-20 LAB — CBC WITH DIFFERENTIAL/PLATELET
Abs Immature Granulocytes: 0.07 10*3/uL (ref 0.00–0.07)
Basophils Absolute: 0 10*3/uL (ref 0.0–0.1)
Basophils Relative: 0 %
Eosinophils Absolute: 0 10*3/uL (ref 0.0–0.5)
Eosinophils Relative: 0 %
HCT: 44.3 % (ref 39.0–52.0)
Hemoglobin: 14.3 g/dL (ref 13.0–17.0)
Immature Granulocytes: 1 %
Lymphocytes Relative: 11 %
Lymphs Abs: 1.5 10*3/uL (ref 0.7–4.0)
MCH: 28.8 pg (ref 26.0–34.0)
MCHC: 32.3 g/dL (ref 30.0–36.0)
MCV: 89.3 fL (ref 80.0–100.0)
Monocytes Absolute: 1.2 10*3/uL — ABNORMAL HIGH (ref 0.1–1.0)
Monocytes Relative: 9 %
Neutro Abs: 10.2 10*3/uL — ABNORMAL HIGH (ref 1.7–7.7)
Neutrophils Relative %: 79 %
Platelets: 177 10*3/uL (ref 150–400)
RBC: 4.96 MIL/uL (ref 4.22–5.81)
RDW: 14.4 % (ref 11.5–15.5)
WBC: 13 10*3/uL — ABNORMAL HIGH (ref 4.0–10.5)
nRBC: 0 % (ref 0.0–0.2)

## 2021-02-20 LAB — GLUCOSE, CAPILLARY
Glucose-Capillary: 137 mg/dL — ABNORMAL HIGH (ref 70–99)
Glucose-Capillary: 147 mg/dL — ABNORMAL HIGH (ref 70–99)
Glucose-Capillary: 162 mg/dL — ABNORMAL HIGH (ref 70–99)
Glucose-Capillary: 169 mg/dL — ABNORMAL HIGH (ref 70–99)

## 2021-02-20 LAB — SURGICAL PATHOLOGY

## 2021-02-20 MED ORDER — ACETAMINOPHEN 500 MG PO TABS: 1000.0000 mg | ORAL_TABLET | Freq: Three times a day (TID) | ORAL | 0 refills | Status: AC

## 2021-02-20 MED ORDER — ONDANSETRON 4 MG PO TBDP
4.0000 mg | ORAL_TABLET | Freq: Four times a day (QID) | ORAL | 0 refills | Status: DC | PRN
  Filled 2021-02-20: qty 20, 5d supply, fill #0

## 2021-02-20 MED ORDER — ENOXAPARIN SODIUM 40 MG/0.4ML IJ SOSY
40.0000 mg | PREFILLED_SYRINGE | Freq: Two times a day (BID) | INTRAMUSCULAR | 0 refills | Status: DC
  Filled 2021-02-20: qty 11.2, 14d supply, fill #0

## 2021-02-20 MED ORDER — ENOXAPARIN (LOVENOX) PATIENT EDUCATION KIT
PACK | Freq: Once | Status: AC
  Filled 2021-02-20: qty 1

## 2021-02-20 MED ORDER — PANTOPRAZOLE SODIUM 40 MG PO TBEC
40.0000 mg | DELAYED_RELEASE_TABLET | Freq: Every day | ORAL | 0 refills | Status: DC
  Filled 2021-02-20: qty 90, 90d supply, fill #0

## 2021-02-20 NOTE — Discharge Summary (Signed)
Physician Discharge Summary  Hector Curry EXH:[id number] DOB: June 22, 1961 DOA: 02/18/2021  PCP: Marguarite Arbour, MD  Admit date: 02/18/2021 Discharge date: 02/20/2021  Recommendations for Outpatient Follow-up:   (include homehealth, outpatient follow-up instructions, specific recommendations for PCP to follow-up on, etc.)   Follow-up Information     [name], De Blanch, MD. Go on 03/14/2021.   Specialty: General Surgery Why: at 3:20pm.  Please arrive 15 minutes prior to your appointment time.  Thank you. Contact information: [address] [place] [address] [phone]         Surgery, [place]. Go on 04/11/2021.   Specialty: General Surgery Why: at 9am with Dr. Sheliah Hatch.  Please arrive 15 minutes prior to your appointment time.  Thank you. Contact information: [address] [place] [address] [phone]                Discharge Diagnoses:  Active Problems:   Morbid obesity (HCC)   Surgical Procedure: Roux-en-Y gastric bypass, upper endoscopy  Discharge Condition: Good Disposition: Home  Diet recommendation: Postoperative sleeve gastrectomy diet (liquids only)  Filed Weights   02/18/21 0810  Weight: (!) 140.9 kg     Hospital Course:  The patient was admitted after undergoing Roux-en-Y gastric bypass. POD 0 he ambulated well. POD 1 he was started on the water diet protocol and tolerated 300 ml in the first shift. Once meeting the water amount he was advanced to bariatric protein shakes which they tolerated and were discharged home POD 2.  Treatments: surgery: Roux-en-Y gastric bypass  Discharge Instructions  Discharge Instructions     Ambulate hourly while awake   Complete by: As directed    Call MD for:  difficulty breathing, headache or visual disturbances   Complete by: As directed    Call MD for:  persistant dizziness or light-headedness   Complete by: As directed    Call MD for:  persistant nausea  and vomiting   Complete by: As directed    Call MD for:  redness, tenderness, or signs of infection (pain, swelling, redness, odor or green/yellow discharge around incision site)   Complete by: As directed    Call MD for:  severe uncontrolled pain   Complete by: As directed    Call MD for:  temperature >101 F   Complete by: As directed    Diet bariatric full liquid   Complete by: As directed    Discharge wound care:   Complete by: As directed    Remove Bandaids tomorrow, ok to shower tomorrow. Steristrips may fall off in 1-3 weeks.   Incentive spirometry   Complete by: As directed    Perform hourly while awake      Allergies as of 02/20/2021       Reactions   Other Other (See Comments)   Cats        Medication List     STOP taking these medications    aspirin 81 MG tablet   pioglitazone 30 MG tablet Commonly known as: ACTOS       TAKE these medications    acetaminophen 500 MG tablet Commonly known as: TYLENOL Take 2 tablets (1,000 mg total) by mouth every 8 (eight) hours for 5 days.   bisoprolol-hydrochlorothiazide 10-6.25 MG tablet Commonly known as: ZIAC TAKE 1 TABLET BY MOUTH ONCE DAILY Notes to patient: Monitor Blood Pressure Daily and keep a log for primary care physician.  You may need to make changes to your medications  with rapid weight loss.     cetirizine 10 MG tablet Commonly known as: ZYRTEC Take 10 mg by mouth daily.   cholecalciferol 1000 units tablet Commonly known as: VITAMIN D Take 1,000 Units by mouth daily.   diltiazem 60 MG tablet Commonly known as: CARDIZEM Take 1 tablet (60 mg total) by mouth every 8 (eight) hours. What changed: when to take this   docusate sodium 100 MG capsule Commonly known as: COLACE Take 100 mg by mouth at bedtime.   enoxaparin 40 MG/0.4ML injection Commonly known as: LOVENOX Inject 0.4 mLs (40 mg total) into the skin every 12 (twelve) hours for 14 days.   fenofibrate 145 MG tablet Commonly known as:  TRICOR Take 145 mg by mouth daily.   Flaxseed Oil 1200 MG Caps Take 1,200 mg by mouth daily.   fluticasone 50 MCG/ACT nasal spray Commonly known as: FLONASE Place 1 spray into both nostrils daily as needed for allergies or rhinitis.   magnesium gluconate 500 MG tablet Commonly known as: MAGONATE Take 500 mg by mouth daily.   metFORMIN 850 MG tablet Commonly known as: GLUCOPHAGE Take 1 tablet (850 mg total) by mouth 2 (two) times daily. Notes to patient: Monitor Blood Sugar Frequently and keep a log for primary care physician, you may need to adjust medication dosage with rapid weight loss.     ondansetron 4 MG disintegrating tablet Commonly known as: ZOFRAN-ODT Take 1 tablet (4 mg total) by mouth every 6 (six) hours as needed for nausea or vomiting.   OVER THE COUNTER MEDICATION Place 1 application into both ears. Mometasone flroate topical solution 0.1 % once per month   pantoprazole 40 MG tablet Commonly known as: PROTONIX Take 1 tablet (40 mg total) by mouth daily.   propranolol 20 MG tablet Commonly known as: INDERAL TAKE 1 TABLET BY MOUTH 3  TIMES DAILY AS NEEDED What changed:  when to take this reasons to take this   QUERCETIN PO Take 500 mg by mouth daily at 8 pm.   Sodium Fluoride 5000 Sensitive 1.1-5 % Gel Generic drug: Sod Fluoride-Potassium Nitrate 1 application by Mouth Rinse route See admin instructions.   SYSTANE BALANCE OP Place 1 drop into both eyes 2 (two) times a week.   Turmeric 500 MG Caps Take 1,000 mg by mouth daily.   zinc gluconate 50 MG tablet Take 50 mg by mouth daily.       ASK your doctor about these medications    potassium chloride SA 20 MEQ tablet Commonly known as: KLOR-CON TAKE 1 TABLET BY MOUTH TWO TIMES DAILY               Discharge Care Instructions  (From admission, onward)           Start     Ordered   02/20/21 0000  Discharge wound care:       Comments: Remove Bandaids tomorrow, ok to shower  tomorrow. Steristrips may fall off in 1-3 weeks.   02/20/21 0858            Follow-up Information     [name], De Blanch, MD. Go on 03/14/2021.   Specialty: General Surgery Why: at 3:20pm.  Please arrive 15 minutes prior to your appointment time.  Thank you. Contact information: [address] [place] [address] [phone]         Surgery, [place]. Go on 04/11/2021.   Specialty: General Surgery Why: at 9am with Dr. Sheliah Hatch.  Please arrive 60  minutes prior to your appointment time.  Thank you. Contact information: [address] [place] [address] [phone]                  The results of significant diagnostics from this hospitalization (including imaging, microbiology, ancillary and laboratory) are listed below for reference.    Significant Diagnostic Studies: No results found.  Labs: Basic Metabolic Panel: Recent Labs  Lab 02/19/21 0352  NA 136  K 4.1  CL 100  CO2 27  GLUCOSE 261*  BUN 17  CREATININE 1.03  CALCIUM 9.3   Liver Function Tests: Recent Labs  Lab 02/19/21 0352  AST 85*  ALT 142*  ALKPHOS 50  BILITOT 1.1  PROT 8.0  ALBUMIN 4.4    CBC: Recent Labs  Lab 02/18/21 1435 02/19/21 0352 02/20/21 0424  WBC  --  13.5* 13.0*  NEUTROABS  --  11.5* 10.2*  HGB 16.0 15.1 14.3  HCT 50.3 47.1 44.3  MCV  --  89.9 89.3  PLT  --  228 177    CBG: Recent Labs  Lab 02/19/21 1533 02/19/21 2028 02/20/21 0012 02/20/21 0435 02/20/21 0747  GLUCAP 163* 155* 169* 162* 147*    Active Problems:   Morbid obesity (HCC)   VTE plan: I will prescribe outpatient chemical prophylaxis of enoxaparin due to this increased risk ([email or web address])  Time coordinating discharge: 15 min

## 2021-02-21 ENCOUNTER — Other Ambulatory Visit (HOSPITAL_COMMUNITY): Payer: Self-pay

## 2021-02-21 ENCOUNTER — Other Ambulatory Visit: Payer: Self-pay | Admitting: *Deleted

## 2021-02-21 NOTE — Patient Outreach (Signed)
[facility]) Care Management  02/21/2021  Hector Curry [birth year] [id number]  Transition of care telephone call  Referral received:02/18/21 Initial outreach:02/21/21 Insurance: [facility]   Initial unsuccessful telephone call to patient's preferred number in order to complete transition of care assessment; no answer, left HIPAA compliant voicemail message requesting return call.   Objective: Per the electronic medical record, [name] was hospitalized at [facility] 6/27-6/29/22 for Laparoscopic gastric bypass with upper endoscopy  . Comorbidities include: Obesity, Diabetes type 2, recent A1c 8.1%, OSA,  hypertension . He was discharged to home on 02/20/21 without the need for home health services or durable medical equipment per the discharge summary.   Plan: This RNCM will route unsuccessful outreach letter with [facility] Care Management pamphlet and 24 hour Nurse Advice Line Magnet to [employer] Care Management clinical pool to be mailed to patient's home address. This RNCM will attempt another outreach within 4 business days.   Egbert Garibaldi, RN, BSN  [facility] Care Management,Care Management Coordinator  [phone]- Mobile [phone]- Toll Free Main Office

## 2021-02-26 ENCOUNTER — Telehealth (HOSPITAL_COMMUNITY): Payer: Self-pay | Admitting: *Deleted

## 2021-02-26 ENCOUNTER — Other Ambulatory Visit: Payer: Self-pay | Admitting: *Deleted

## 2021-02-26 ENCOUNTER — Encounter: Payer: Self-pay | Admitting: *Deleted

## 2021-02-26 NOTE — Patient Outreach (Addendum)
[facility]) Care Management  02/26/2021  Hector Curry 09/05/1960 [id number]   Transition of care call Referral received: 02/18/21 Initial outreach attempt: 02/21/21 Insurance: [employer]    2nd unsuccessful telephone call to patient's preferred contact number in order to complete post hospital discharge transition of care assessment , no answer left HIPAA compliant message requesting return call.    Objective: Per the electronic medical record, HectorHector Curry was hospitalized at [facility] 6/27-6/29/22 for Laparoscopic gastric bypass with upper endoscopy  . Comorbidities include: Obesity, Diabetes type 2, recent A1c 8.1%, OSA,  hypertension . He was discharged to home on 02/20/21 without the need for home health services or durable medical equipment per the discharge summary.   Incoming return call from patient  Subjective: 2 HIPAA identifiers verified. Explained purpose of call and completed transition of care assessment.  Coner that he is  is doing good. He discussed still working on getting in enough of protein supplement in, continues to try different options. He discussed getting less than 30 Gm  liquid protein on some days, reports some nausea taking antinausea medication as needed. He reports tolerating other clear liquids well. He discussed that he has to adjust to sipping liquids as he has is used to  gluping liquids. Discussed importance of protein intake after surgery, he voiced understanding and states he feels much better when getting in protein, encouraged notifying MD /Nutritionist if ongoing concerns. He discussed looking forward to being able to increase consistency of foods to get protein. States surgical incisions are unremarkable, states surgical pain well managed with prescribed medications. He  denies bowel or bladder problems.  Spouse is  assisting with his recovery. He reports tolerating mobility in home and taking walks  outside,he continues to use incentive, taking Lovenox as prescribed.   Reviewed accessing the following [place] Benefits : He discussed history of diabetes and ongoing follow up active health management program. He is unsure if has the hospital indemnity plan, states he will review his benefits and follow up if needed.  He uses  a [facility] outpatient pharmacy, [facility] outpatient [facility].       Assessment:  Patient voices good understanding of all discharge instructions.  See transition of care flowsheet for assessment details.   Plan:  Reviewed hospital discharge diagnosis of Laparoscopic gastric bypass   and discharge treatment plan using hospital discharge instructions, assessing medication adherence, reviewing problems requiring provider notification, and discussing the importance of follow up with surgeon, primary care provider and/or specialists as directed.  Reviewed [place] healthy lifestyle program information to receive discounted premium for  2023   Step 1: Get  your annual physical  Step 2: Complete your health assessment  Step 3:Identify your current health status and complete the corresponding action step between August 25, 2020 and April 25, 2021.    Using Active Health Management ActiveAdvice View website, verified that patient is an active participate in [place]'s Active Health Management chronic disease management program.    No ongoing care management needs identified so will close case to [facility] Care Management services and route successful outreach letter with [facility] Care Management pamphlet and 24 Hour Nurse Line Magnet to [employer] Care Management clinical pool to be mailed to patient's home address.  Thanked patient for their services to [facility].    Egbert Garibaldi, RN, BSN  [facility] Care Management,Care Management Coordinator  [phone]- Mobile [phone]- Toll Free Main Office

## 2021-03-01 ENCOUNTER — Other Ambulatory Visit (HOSPITAL_COMMUNITY): Payer: Self-pay

## 2021-03-05 ENCOUNTER — Encounter: Payer: No Typology Code available for payment source | Attending: General Surgery | Admitting: Skilled Nursing Facility1

## 2021-03-05 ENCOUNTER — Other Ambulatory Visit: Payer: Self-pay

## 2021-03-05 DIAGNOSIS — E1165 Type 2 diabetes mellitus with hyperglycemia: Secondary | ICD-10-CM | POA: Insufficient documentation

## 2021-03-06 ENCOUNTER — Other Ambulatory Visit (HOSPITAL_COMMUNITY)
Admission: RE | Admit: 2021-03-06 | Discharge: 2021-03-06 | Disposition: A | Discharge status: Home / Self Care | Payer: No Typology Code available for payment source | Source: Ambulatory Visit | Attending: Internal Medicine | Admitting: Internal Medicine

## 2021-03-06 DIAGNOSIS — E7800 Pure hypercholesterolemia, unspecified: Secondary | ICD-10-CM | POA: Diagnosis present

## 2021-03-06 DIAGNOSIS — I10 Essential (primary) hypertension: Secondary | ICD-10-CM | POA: Diagnosis present

## 2021-03-06 DIAGNOSIS — Z79899 Other long term (current) drug therapy: Secondary | ICD-10-CM | POA: Diagnosis present

## 2021-03-06 DIAGNOSIS — E118 Type 2 diabetes mellitus with unspecified complications: Secondary | ICD-10-CM | POA: Insufficient documentation

## 2021-03-06 LAB — COMPREHENSIVE METABOLIC PANEL
ALT: 38 U/L (ref 0–44)
AST: 26 U/L (ref 15–41)
Albumin: 3.4 g/dL — ABNORMAL LOW (ref 3.5–5.0)
Alkaline Phosphatase: 54 U/L (ref 38–126)
Anion gap: 15 (ref 5–15)
BUN: 8 mg/dL (ref 6–20)
CO2: 27 mmol/L (ref 22–32)
Calcium: 8.8 mg/dL — ABNORMAL LOW (ref 8.9–10.3)
Chloride: 95 mmol/L — ABNORMAL LOW (ref 98–111)
Creatinine, Ser: 0.78 mg/dL (ref 0.61–1.24)
GFR, Estimated: >60 mL/min (ref >60)
Glucose, Bld: 154 mg/dL — ABNORMAL HIGH (ref 70–99)
Potassium: 3 mmol/L — ABNORMAL LOW (ref 3.5–5.1)
Sodium: 137 mmol/L (ref 135–145)
Total Bilirubin: 1.6 mg/dL — ABNORMAL HIGH (ref 0.3–1.2)
Total Protein: 7.2 g/dL (ref 6.5–8.1)

## 2021-03-06 LAB — URINALYSIS, ROUTINE W REFLEX MICROSCOPIC
Bilirubin Urine: NEGATIVE
Glucose, UA: NEGATIVE mg/dL
Hgb urine dipstick: NEGATIVE
Ketones, ur: 80 mg/dL — AB
Leukocytes,Ua: NEGATIVE
Nitrite: NEGATIVE
Protein, ur: 100 mg/dL — AB
Specific Gravity, Urine: 1.023 (ref 1.005–1.030)
pH: 6 (ref 5.0–8.0)

## 2021-03-06 LAB — CBC WITH DIFFERENTIAL/PLATELET
Abs Immature Granulocytes: 0.11 10*3/uL — ABNORMAL HIGH (ref 0.00–0.07)
Basophils Absolute: 0.1 10*3/uL (ref 0.0–0.1)
Basophils Relative: 1 %
Eosinophils Absolute: 0.3 10*3/uL (ref 0.0–0.5)
Eosinophils Relative: 3 %
HCT: 44.2 % (ref 39.0–52.0)
Hemoglobin: 14.3 g/dL (ref 13.0–17.0)
Immature Granulocytes: 1 %
Lymphocytes Relative: 21 %
Lymphs Abs: 1.8 10*3/uL (ref 0.7–4.0)
MCH: 28.7 pg (ref 26.0–34.0)
MCHC: 32.4 g/dL (ref 30.0–36.0)
MCV: 88.8 fL (ref 80.0–100.0)
Monocytes Absolute: 0.8 10*3/uL (ref 0.1–1.0)
Monocytes Relative: 10 %
Neutro Abs: 5.4 10*3/uL (ref 1.7–7.7)
Neutrophils Relative %: 64 %
Platelets: 272 10*3/uL (ref 150–400)
RBC: 4.98 MIL/uL (ref 4.22–5.81)
RDW: 14.1 % (ref 11.5–15.5)
WBC: 8.4 10*3/uL (ref 4.0–10.5)
nRBC: 0 % (ref 0.0–0.2)

## 2021-03-06 LAB — LIPID PANEL
Cholesterol: 151 mg/dL (ref 0–200)
HDL: 20 mg/dL — ABNORMAL LOW (ref >40)
LDL Cholesterol: 101 mg/dL — ABNORMAL HIGH (ref 0–99)
Total CHOL/HDL Ratio: 7.6 RATIO
Triglycerides: 151 mg/dL — ABNORMAL HIGH (ref <150)
VLDL: 30 mg/dL (ref 0–40)

## 2021-03-06 LAB — HEMOGLOBIN A1C
Hgb A1c MFr Bld: 7.3 % — ABNORMAL HIGH (ref 4.8–5.6)
Mean Plasma Glucose: 162.81 mg/dL

## 2021-03-06 NOTE — Progress Notes (Signed)
2 Week Post-Operative Nutrition Class   Patient was seen on 03/05/2021 for Post-Operative Nutrition education at the [facility].    Surgery date: 02/18/2021 Surgery type: RYGB Start weight at [facility]: 328.7 Weight today: 290.2 Bowel Habits: Every day to every other day no complaints   Body Composition Scale 03/05/2021  Current Body Weight 290.2  Total Body Fat % 33.5  Visceral Fat 26  Fat-Free Mass % 66.4   Total Body Water % 47.4  Muscle-Mass lbs 54.7  BMI 37.3  Body Fat Displacement          Torso  lbs 60.3         Left Leg  lbs 12         Right Leg  lbs 12         Left Arm  lbs 6         Right Arm   lbs 6      The following the learning objectives were met by the patient during this course: Identifies Phase 3 (Soft, High Proteins) Dietary Goals and will begin from 2 weeks post-operatively to 2 months post-operatively Identifies appropriate sources of fluids and proteins  Identifies appropriate fat sources and healthy verses unhealthy fat types   States protein recommendations and appropriate sources post-operatively Identifies the need for appropriate texture modifications, mastication, and bite sizes when consuming solids Identifies appropriate fat consumption and sources Identifies appropriate multivitamin and calcium sources post-operatively Describes the need for physical activity post-operatively and will follow MD recommendations States when to call healthcare provider regarding medication questions or post-operative complications   Handouts given during class include: Phase 3A: Soft, High Protein Diet Handout Phase 3 High Protein Meals Healthy Fats   Follow-Up Plan: Patient will follow-up at [facility] in 6 weeks for 2 month post-op nutrition visit for diet advancement per MD.

## 2021-03-09 ENCOUNTER — Observation Stay (HOSPITAL_COMMUNITY)
Admission: EM | Admit: 2021-03-09 | Discharge: 2021-03-09 | Disposition: A | Discharge status: Home / Self Care | Payer: No Typology Code available for payment source | Attending: General Surgery | Admitting: General Surgery

## 2021-03-09 ENCOUNTER — Observation Stay (HOSPITAL_COMMUNITY): Payer: No Typology Code available for payment source

## 2021-03-09 ENCOUNTER — Emergency Department (HOSPITAL_COMMUNITY): Payer: No Typology Code available for payment source

## 2021-03-09 ENCOUNTER — Other Ambulatory Visit: Payer: Self-pay

## 2021-03-09 ENCOUNTER — Encounter (HOSPITAL_COMMUNITY): Payer: Self-pay

## 2021-03-09 ENCOUNTER — Other Ambulatory Visit (HOSPITAL_COMMUNITY): Payer: Self-pay

## 2021-03-09 DIAGNOSIS — Z87891 Personal history of nicotine dependence: Secondary | ICD-10-CM | POA: Diagnosis not present

## 2021-03-09 DIAGNOSIS — E119 Type 2 diabetes mellitus without complications: Secondary | ICD-10-CM | POA: Insufficient documentation

## 2021-03-09 DIAGNOSIS — Z79899 Other long term (current) drug therapy: Secondary | ICD-10-CM | POA: Diagnosis not present

## 2021-03-09 DIAGNOSIS — Z20822 Contact with and (suspected) exposure to covid-19: Secondary | ICD-10-CM | POA: Insufficient documentation

## 2021-03-09 DIAGNOSIS — I10 Essential (primary) hypertension: Secondary | ICD-10-CM | POA: Insufficient documentation

## 2021-03-09 DIAGNOSIS — Z9884 Bariatric surgery status: Secondary | ICD-10-CM | POA: Diagnosis not present

## 2021-03-09 DIAGNOSIS — Z7984 Long term (current) use of oral hypoglycemic drugs: Secondary | ICD-10-CM | POA: Diagnosis not present

## 2021-03-09 DIAGNOSIS — E876 Hypokalemia: Secondary | ICD-10-CM | POA: Insufficient documentation

## 2021-03-09 DIAGNOSIS — K222 Esophageal obstruction: Secondary | ICD-10-CM | POA: Diagnosis not present

## 2021-03-09 DIAGNOSIS — I2510 Atherosclerotic heart disease of native coronary artery without angina pectoris: Secondary | ICD-10-CM | POA: Insufficient documentation

## 2021-03-09 DIAGNOSIS — R112 Nausea with vomiting, unspecified: Secondary | ICD-10-CM | POA: Diagnosis present

## 2021-03-09 DIAGNOSIS — K311 Adult hypertrophic pyloric stenosis: Principal | ICD-10-CM | POA: Insufficient documentation

## 2021-03-09 DIAGNOSIS — R1111 Vomiting without nausea: Secondary | ICD-10-CM

## 2021-03-09 LAB — CBC
HCT: 42.7 % (ref 39.0–52.0)
Hemoglobin: 13.9 g/dL (ref 13.0–17.0)
MCH: 28.7 pg (ref 26.0–34.0)
MCHC: 32.6 g/dL (ref 30.0–36.0)
MCV: 88 fL (ref 80.0–100.0)
Platelets: 202 10*3/uL (ref 150–400)
RBC: 4.85 MIL/uL (ref 4.22–5.81)
RDW: 14.5 % (ref 11.5–15.5)
WBC: 7.2 10*3/uL (ref 4.0–10.5)
nRBC: 0 % (ref 0.0–0.2)

## 2021-03-09 LAB — COMPREHENSIVE METABOLIC PANEL
ALT: 32 U/L (ref 0–44)
AST: 24 U/L (ref 15–41)
Albumin: 3.6 g/dL (ref 3.5–5.0)
Alkaline Phosphatase: 53 U/L (ref 38–126)
Anion gap: 16 — ABNORMAL HIGH (ref 5–15)
BUN: 11 mg/dL (ref 6–20)
CO2: 29 mmol/L (ref 22–32)
Calcium: 9.1 mg/dL (ref 8.9–10.3)
Chloride: 96 mmol/L — ABNORMAL LOW (ref 98–111)
Creatinine, Ser: 0.92 mg/dL (ref 0.61–1.24)
GFR, Estimated: >60 mL/min (ref >60)
Glucose, Bld: 152 mg/dL — ABNORMAL HIGH (ref 70–99)
Potassium: 3.3 mmol/L — ABNORMAL LOW (ref 3.5–5.1)
Sodium: 141 mmol/L (ref 135–145)
Total Bilirubin: 1.7 mg/dL — ABNORMAL HIGH (ref 0.3–1.2)
Total Protein: 7.5 g/dL (ref 6.5–8.1)

## 2021-03-09 LAB — CBC WITH DIFFERENTIAL/PLATELET
Abs Immature Granulocytes: 0.04 10*3/uL (ref 0.00–0.07)
Basophils Absolute: 0.1 10*3/uL (ref 0.0–0.1)
Basophils Relative: 1 %
Eosinophils Absolute: 0.2 10*3/uL (ref 0.0–0.5)
Eosinophils Relative: 3 %
HCT: 44.1 % (ref 39.0–52.0)
Hemoglobin: 15.5 g/dL (ref 13.0–17.0)
Immature Granulocytes: 1 %
Lymphocytes Relative: 22 %
Lymphs Abs: 1.6 10*3/uL (ref 0.7–4.0)
MCH: 31.2 pg (ref 26.0–34.0)
MCHC: 35.1 g/dL (ref 30.0–36.0)
MCV: 88.7 fL (ref 80.0–100.0)
Monocytes Absolute: 0.9 10*3/uL (ref 0.1–1.0)
Monocytes Relative: 12 %
Neutro Abs: 4.6 10*3/uL (ref 1.7–7.7)
Neutrophils Relative %: 61 %
Platelets: 252 10*3/uL (ref 150–400)
RBC: 4.97 MIL/uL (ref 4.22–5.81)
RDW: 14.5 % (ref 11.5–15.5)
WBC: 7.4 10*3/uL (ref 4.0–10.5)
nRBC: 0 % (ref 0.0–0.2)

## 2021-03-09 LAB — CREATININE, SERUM
Creatinine, Ser: 0.82 mg/dL (ref 0.61–1.24)
GFR, Estimated: >60 mL/min (ref >60)

## 2021-03-09 LAB — HIV ANTIBODY (ROUTINE TESTING W REFLEX): HIV Screen 4th Generation wRfx: NONREACTIVE

## 2021-03-09 LAB — RESP PANEL BY RT-PCR (FLU A&B, COVID) ARPGX2
Influenza A by PCR: NEGATIVE
Influenza B by PCR: NEGATIVE
SARS Coronavirus 2 by RT PCR: NEGATIVE

## 2021-03-09 LAB — LIPASE, BLOOD: Lipase: 84 U/L — ABNORMAL HIGH (ref 11–51)

## 2021-03-09 MED ORDER — SODIUM CHLORIDE 0.9 % IV BOLUS
1000.0000 mL | Freq: Once | INTRAVENOUS | Status: AC
  Administered 2021-03-09: 1000 mL via INTRAVENOUS

## 2021-03-09 MED ORDER — PANTOPRAZOLE SODIUM 40 MG IV SOLR: 40.0000 mg | Freq: Two times a day (BID) | INTRAVENOUS | Status: DC

## 2021-03-09 MED ORDER — ONDANSETRON HCL 4 MG/2ML IJ SOLN: 4.0000 mg | Freq: Four times a day (QID) | INTRAMUSCULAR | Status: DC | PRN

## 2021-03-09 MED ORDER — ONDANSETRON 4 MG PO TBDP: 4.0000 mg | ORAL_TABLET | Freq: Four times a day (QID) | ORAL | Status: DC | PRN

## 2021-03-09 MED ORDER — IOHEXOL 300 MG/ML  SOLN
50.0000 mL | Freq: Once | INTRAMUSCULAR | Status: AC | PRN
  Administered 2021-03-09: 80 mL via ORAL

## 2021-03-09 MED ORDER — PANTOPRAZOLE SODIUM 40 MG IV SOLR
40.0000 mg | Freq: Every day | INTRAVENOUS | Status: DC
  Administered 2021-03-09: 40 mg via INTRAVENOUS
  Filled 2021-03-09: qty 40

## 2021-03-09 MED ORDER — ENOXAPARIN SODIUM 40 MG/0.4ML IJ SOSY
40.0000 mg | PREFILLED_SYRINGE | INTRAMUSCULAR | Status: DC
  Administered 2021-03-09: 40 mg via SUBCUTANEOUS
  Filled 2021-03-09: qty 0.4

## 2021-03-09 MED ORDER — POTASSIUM CHLORIDE 10 MEQ/100ML IV SOLN
10.0000 meq | INTRAVENOUS | Status: AC
  Administered 2021-03-09 (×2): 10 meq via INTRAVENOUS
  Filled 2021-03-09: qty 100

## 2021-03-09 MED ORDER — IOHEXOL 350 MG/ML SOLN
80.0000 mL | Freq: Once | INTRAVENOUS | Status: AC | PRN
  Administered 2021-03-09: 80 mL via INTRAVENOUS

## 2021-03-09 MED ORDER — ONDANSETRON 4 MG PO TBDP
4.0000 mg | ORAL_TABLET | Freq: Four times a day (QID) | ORAL | 0 refills | Status: DC | PRN
  Filled 2021-03-09: qty 20, 5d supply, fill #0

## 2021-03-09 MED ORDER — KCL IN DEXTROSE-NACL 20-5-0.45 MEQ/L-%-% IV SOLN
INTRAVENOUS | Status: DC
  Filled 2021-03-09 (×2): qty 1000

## 2021-03-09 MED ORDER — PANTOPRAZOLE SODIUM 40 MG PO TBEC
40.0000 mg | DELAYED_RELEASE_TABLET | Freq: Every day | ORAL | 1 refills | Status: DC
  Filled 2021-03-09: qty 30, 30d supply, fill #0

## 2021-03-09 MED ORDER — LIP MEDEX EX OINT
TOPICAL_OINTMENT | CUTANEOUS | Status: AC
  Filled 2021-03-09: qty 7

## 2021-03-09 NOTE — ED Triage Notes (Addendum)
Pt came in with c/o N/V. Pt had bariatric surgery 3 weeks ago. Switched to soft diet on Tue and has been vomiting since Wed. Pt states he cannot keep water down at this point. He called his surgeon and his surgeon advised him to come here for CT scan. Pt endorses dizziness

## 2021-03-09 NOTE — Progress Notes (Signed)
Reviewed written d/c instructions w pt and wife and questions answered. They both verbalized understanding. Pt d/c per w/c in stable condition w all belongings.

## 2021-03-09 NOTE — H&P (Signed)
Hector Curry is an 60 y.o. male.    General Surgery [facility]  Chief Complaint: inability to eat / take fluids  HPI: Patient of Dr. 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  Developed inability to tolerate oral intack beginning 4 days ago when he advanced to the phase II bariatric diet.  Now unable to tolerate thin liquids.  CT in ER without significant complications.  CBD, BMET ok except for hypokalemia.  Admitted to general surgery service for hydration and evaluation.  Past Medical History:  Diagnosis Date   Allergic rhinitis    Atrial fibrillation (HCC)    Coronary artery disease, non-occlusive    a. 05/08/2015 Cath: no significant CAD, LVEF nl-->Med; b. 07/2017 MV: attenuation artifact, no ischemia, EF 65%-->Low risk.   Diabetes mellitus without complication (HCC)    Diastolic dysfunction    a. 04/2015 Echo: EF 60-65%, mild MR, mildly dil LA, nl RV fxn, nl PASP; b. 05/2016 Echo: EF 60-65%, no rmwa, Gr1 DD, mildly dil Ao root and asc Ao; c. 07/2017 Echo: EF 55-60%, Ao root 57mm. mildly dil LA; d. 03/2019 Echo: EF 55-60%, nl RV fxn. Mild LAE, Ao root/Asc Ao 23mm.   Dilated aortic root (HCC)    a. 07/2017 Echo: 45mm Ao root - mildly dil; b. 03/2019 Echo: Ao root 66mm.   Diverticulosis 10/03/2014   Dysrhythmia    Family history of premature CAD    a. father passed from MI at 34   GERD (gastroesophageal reflux disease)    Hemorrhoids    a. internal hemorrhoids s/p surgery 1999   History of kidney stones    History of tobacco abuse    Hyperlipidemia    Hyperplastic colon polyp 10/03/2014   a. x 2    Hypertension    Inflammatory arthritis    a. CCP antibodies & x-rays negative. Rheumatoid factor 14, felt to be crystaline over RA or psoriatic   Morbid obesity (HCC)    a. s/p LAP-BAND   OSA (obstructive sleep apnea)    a. on CPAP   Osteoarthritis    PSVT (paroxysmal supraventricular tachycardia) (HCC)    a. 48 hr Holter  04/2015: NSR w/ rare PVC, short runs of narrow complex tachycardiac, possible atrial tach, longest run 7 beats, PACs noted (2% of all beats 3600 total) they did not seem to correlate w/ significant arrythmia; b. 08/2017 Event monitor: no significant arrhythmias.    Past Surgical History:  Procedure Laterality Date   BARIATRIC SURGERY     lap band    CARDIAC CATHETERIZATION N/A 05/08/2015   Procedure: Left Heart Cath and Coronary Angiography;  Surgeon: Corky Crafts, MD;  Location: [facility] INVASIVE CV LAB;  Service: Cardiovascular;  Laterality: N/A;   CARPAL TUNNEL RELEASE Left    CHOLECYSTECTOMY     COLONOSCOPY  06/27/2005   COLONOSCOPY  10/03/2014   ESOPHAGOGASTRODUODENOSCOPY  06/27/2005   GASTRIC ROUX-EN-Y N/A 02/18/2021   Procedure: LAPAROSCOPIC ROUX-EN-Y GASTRIC BYPASS WITH UPPER ENDOSCOPY,;  Surgeon: Sheliah Hatch De Blanch, MD;  Location: [facility];  Service: General;  Laterality: N/A;   HEMORRHOID SURGERY  08/25/1997   MOUTH SURGERY     removed area which was benign   TONSILLECTOMY      Family History  Problem Relation Age of Onset   Hypertension Mother    Diabetes Mother    Heart attack Father 19   CAD Father    Hypertension Sister    Diabetes Other  Hypertension Other    Heart disease Other    Social History:  reports that he quit smoking about 31 years ago. His smoking use included cigarettes. He has a 15.00 pack-year smoking history. He has never used smokeless tobacco. He reports that he does not drink alcohol and does not use drugs.  Allergies:  Allergies  Allergen Reactions   Other Other (See Comments)    Cats    Medications Prior to Admission  Medication Sig Dispense Refill   bisoprolol-hydrochlorothiazide (ZIAC) 10-6.25 MG tablet TAKE 1 TABLET BY MOUTH ONCE DAILY 90 tablet 3   cetirizine (ZYRTEC) 10 MG tablet Take 10 mg by mouth daily.     cholecalciferol (VITAMIN D) 1000 UNITS tablet Take 1,000 Units by mouth daily.      diltiazem (CARDIZEM) 60 MG tablet  Take 1 tablet (60 mg total) by mouth every 8 (eight) hours. 270 tablet 1   docusate sodium (COLACE) 100 MG capsule Take 100 mg by mouth at bedtime.     enoxaparin (LOVENOX) 40 MG/0.4ML injection Inject 0.4 mLs (40 mg total) into the skin every 12 (twelve) hours for 14 days. 11.2 mL 0   fenofibrate (TRICOR) 145 MG tablet Take 145 mg by mouth daily.      Flaxseed, Linseed, (FLAXSEED OIL) 1200 MG CAPS Take 1,200 mg by mouth daily.     fluticasone (FLONASE) 50 MCG/ACT nasal spray Place 1 spray into both nostrils daily as needed for allergies or rhinitis.     magnesium gluconate (MAGONATE) 500 MG tablet Take 500 mg by mouth daily.     metFORMIN (GLUCOPHAGE) 850 MG tablet Take 1 tablet (850 mg total) by mouth 2 (two) times daily. 180 tablet 1   ondansetron (ZOFRAN-ODT) 4 MG disintegrating tablet Take 1 tablet (4 mg total) by mouth every 6 (six) hours as needed for nausea or vomiting. 20 tablet 0   OVER THE COUNTER MEDICATION Place 1 application into both ears. Mometasone flroate topical solution 0.1 % once per month     pantoprazole (PROTONIX) 40 MG tablet Take 1 tablet (40 mg total) by mouth daily. 90 tablet 0   potassium chloride SA (KLOR-CON) 20 MEQ tablet TAKE 1 TABLET BY MOUTH TWO TIMES DAILY (Patient taking differently: Take 20 mEq by mouth 2 (two) times daily.) 180 tablet 3   propranolol (INDERAL) 20 MG tablet TAKE 1 TABLET BY MOUTH 3  TIMES DAILY AS NEEDED 270 tablet 2   Propylene Glycol (SYSTANE BALANCE OP) Place 1 drop into both eyes 2 (two) times a week.     QUERCETIN PO Take 500 mg by mouth daily at 8 pm.     SODIUM FLUORIDE 5000 SENSITIVE 1.1-5 % GEL 1 application by Mouth Rinse route See admin instructions.     Turmeric 500 MG CAPS Take 1,000 mg by mouth daily.     zinc gluconate 50 MG tablet Take 50 mg by mouth daily.      Results for orders placed or performed during the hospital encounter of 03/09/21 (from the past 48 hour(s))  CBC with Differential     Status: None   Collection  Time: 03/09/21  4:43 AM  Result Value Ref Range   WBC 7.4 4.0 - 10.5 K/uL   RBC 4.97 4.22 - 5.81 MIL/uL   Hemoglobin 15.5 13.0 - 17.0 g/dL   HCT 16.144.1 09.639.0 - 04.552.0 %   MCV 88.7 80.0 - 100.0 fL   MCH 31.2 26.0 - 34.0 pg   MCHC 35.1 30.0 - 36.0 g/dL  RDW 14.5 11.5 - 15.5 %   Platelets 252 150 - 400 K/uL   nRBC 0.0 0.0 - 0.2 %   Neutrophils Relative % 61 %   Neutro Abs 4.6 1.7 - 7.7 K/uL   Lymphocytes Relative 22 %   Lymphs Abs 1.6 0.7 - 4.0 K/uL   Monocytes Relative 12 %   Monocytes Absolute 0.9 0.1 - 1.0 K/uL   Eosinophils Relative 3 %   Eosinophils Absolute 0.2 0.0 - 0.5 K/uL   Basophils Relative 1 %   Basophils Absolute 0.1 0.0 - 0.1 K/uL   Immature Granulocytes 1 %   Abs Immature Granulocytes 0.04 0.00 - 0.07 K/uL    Comment: Performed at [facility], [address], [place], [place] [address]  Comprehensive metabolic panel     Status: Abnormal   Collection Time: 03/09/21  4:43 AM  Result Value Ref Range   Sodium 141 135 - 145 mmol/L   Potassium 3.3 (L) 3.5 - 5.1 mmol/L   Chloride 96 (L) 98 - 111 mmol/L   CO2 29 22 - 32 mmol/L   Glucose, Bld 152 (H) 70 - 99 mg/dL    Comment: Glucose reference range applies only to samples taken after fasting for at least 8 hours.   BUN 11 6 - 20 mg/dL   Creatinine, Ser 6.04 0.61 - 1.24 mg/dL   Calcium 9.1 8.9 - 54.0 mg/dL   Total Protein 7.5 6.5 - 8.1 g/dL   Albumin 3.6 3.5 - 5.0 g/dL   AST 24 15 - 41 U/L   ALT 32 0 - 44 U/L   Alkaline Phosphatase 53 38 - 126 U/L   Total Bilirubin 1.7 (H) 0.3 - 1.2 mg/dL   GFR, Estimated >98 >11 mL/min    Comment: (NOTE) Calculated using the CKD-EPI Creatinine Equation (2021)    Anion gap 16 (H) 5 - 15    Comment: Performed at [facility], [address], [place], [place] [address]  Lipase, blood     Status: Abnormal   Collection Time: 03/09/21  4:43 AM  Result Value Ref Range   Lipase 84 (H) 11 - 51 U/L    Comment: Performed at [facility], [address] [place], [place] [address]  CBC     Status: None   Collection Time: 03/09/21  6:00 AM  Result Value Ref Range   WBC 7.2 4.0 - 10.5 K/uL   RBC 4.85 4.22 - 5.81 MIL/uL   Hemoglobin 13.9 13.0 - 17.0 g/dL   HCT 13.0 86.5 - 78.4 %   MCV 88.0 80.0 - 100.0 fL   MCH 28.7 26.0 - 34.0 pg   MCHC 32.6 30.0 - 36.0 g/dL   RDW 69.6 29.5 - 28.4 %   Platelets 202 150 - 400 K/uL   nRBC 0.0 0.0 - 0.2 %    Comment: Performed at [facility], [address] [place], [place] [address]  Creatinine, serum     Status: None   Collection Time: 03/09/21  6:00 AM  Result Value Ref Range   Creatinine, Ser 0.82 0.61 - 1.24 mg/dL   GFR, Estimated >01 >02 mL/min    Comment: (NOTE) Calculated using the CKD-EPI Creatinine Equation (2021) Performed at [facility], [address], [place], [place] [address]   Resp Panel by RT-PCR (Flu A&B, Covid) Nasopharyngeal Swab     Status: None   Collection Time: 03/09/21  6:10 AM   Specimen: Nasopharyngeal Swab;  Nasopharyngeal(NP) swabs in vial transport medium  Result Value Ref Range   SARS Coronavirus 2 by RT PCR NEGATIVE NEGATIVE    Comment: (NOTE) SARS-CoV-2 target nucleic acids are NOT DETECTED.  The SARS-CoV-2 RNA is generally detectable in upper respiratory specimens during the acute phase of infection. The lowest concentration of SARS-CoV-2 viral copies this assay can detect is 138 copies/mL. A negative result does not preclude SARS-Cov-2 infection and should not be used as the sole basis for treatment or other patient management decisions. A negative result may occur with  improper specimen collection/handling, submission of specimen other than nasopharyngeal swab, presence of viral mutation(s) within the areas targeted by this assay, and inadequate number of viral copies(<138 copies/mL). A negative result must be combined with clinical observations, patient history, and  epidemiological information. The expected result is Negative.  Fact Sheet for Patients:  BloggerCourse.com  Fact Sheet for Healthcare Providers:  SeriousBroker.it  This test is no t yet approved or cleared by the [place] FDA and  has been authorized for detection and/or diagnosis of SARS-CoV-2 by FDA under an Emergency Use Authorization (EUA). This EUA will remain  in effect (meaning this test can be used) for the duration of the COVID-19 declaration under Section 564(b)(1) of the Act, 21 U.S.C.section 360bbb-3(b)(1), unless the authorization is terminated  or revoked sooner.       Influenza A by PCR NEGATIVE NEGATIVE   Influenza B by PCR NEGATIVE NEGATIVE    Comment: (NOTE) The Xpert Xpress SARS-CoV-2/FLU/RSV plus assay is intended as an aid in the diagnosis of influenza from Nasopharyngeal swab specimens and should not be used as a sole basis for treatment. Nasal washings and aspirates are unacceptable for Xpert Xpress SARS-CoV-2/FLU/RSV testing.  Fact Sheet for Patients: BloggerCourse.com  Fact Sheet for Healthcare Providers: SeriousBroker.it  This test is not yet approved or cleared by the [place] FDA and has been authorized for detection and/or diagnosis of SARS-CoV-2 by FDA under an Emergency Use Authorization (EUA). This EUA will remain in effect (meaning this test can be used) for the duration of the COVID-19 declaration under Section 564(b)(1) of the Act, 21 U.S.C. section 360bbb-3(b)(1), unless the authorization is terminated or revoked.  Performed at [facility], [address] [place], [place] [address]    CT ABDOMEN PELVIS W CONTRAST  Result Date: 03/09/2021 CLINICAL DATA:  60 year old male with history of nausea and vomiting. Suspected bowel obstruction. EXAM: CT ABDOMEN AND PELVIS WITH CONTRAST TECHNIQUE: Multidetector CT  imaging of the abdomen and pelvis was performed using the standard protocol following bolus administration of intravenous contrast. CONTRAST:  33mL OMNIPAQUE IOHEXOL 350 MG/ML SOLN COMPARISON:  CT the abdomen and pelvis 12/04/2009. FINDINGS: Lower chest: Unremarkable. Hepatobiliary: No suspicious cystic or solid hepatic lesions are confidently identified on today's noncontrast CT examination. Status post cholecystectomy. Pancreas: No pancreatic mass. No pancreatic ductal dilatation. No pancreatic or peripancreatic fluid collections or inflammatory changes. Spleen: Unremarkable. Adrenals/Urinary Tract: Bilateral kidneys and adrenal glands are normal in appearance. No hydroureteronephrosis. Urinary bladder is normal in appearance. Stomach/Bowel: Status post Roux-en-Y gastric bypass. No pathologic dilatation of small bowel or colon. Normal appendix. Vascular/Lymphatic: Aortic atherosclerosis, without evidence of aneurysm or dissection in the abdominal or pelvic vasculature. No lymphadenopathy noted in the abdomen or pelvis. Reproductive: Prostate gland and seminal vesicles are unremarkable in appearance. Other: No significant volume of ascites.  No pneumoperitoneum. Musculoskeletal: In the right anterior abdominal wall musculature there is a 3.9 x 6.1 x 4.8 cm centrally  low-attenuation lesion (axial image 44 of series 2 and coronal image 32 of series 5) which has slightly ill-defined anterior margins and some haziness in the adjacent subcutaneous fat. There are no aggressive appearing lytic or blastic lesions noted in the visualized portions of the skeleton. IMPRESSION: 1. No evidence of bowel obstruction. 2. Low-attenuation lesion in the anterior abdominal wall musculature, as above. This is favored to represent a resolving spontaneous intramuscular hemorrhage within the right rectus abdominus muscles. There is slight haziness in the surrounding fat which could indicate inflammation, but there is no overt thick rim of  enhancement to clearly indicate an abscess at this time. Further clinical evaluation is recommended. 3. Aortic atherosclerosis. 4. Additional incidental findings, as above. Electronically Signed   By: Trudie Reed M.D.   On: 03/09/2021 05:14    Review of Systems  Constitutional:  Positive for appetite change.  HENT: Negative.    Eyes: Negative.   Respiratory:  Positive for apnea.   Cardiovascular: Negative.   Gastrointestinal:  Positive for vomiting.  Endocrine: Negative.   Genitourinary: Negative.   Musculoskeletal: Negative.   Skin: Negative.   Allergic/Immunologic: Negative.   Neurological: Negative.   Hematological: Negative.   Psychiatric/Behavioral: Negative.      Physical Exam   Blood pressure (!) 141/76, pulse 77, temperature 98.1 F (36.7 C), temperature source Oral, resp. rate 18, height  (1.88 m), SpO2 93 %.  CONSTITUTIONAL: no acute distress; conversant; no obvious deformities  EYES: conjunctiva moist; no lid lag; anicteric; pupils equal bilaterally  NECK: trachea midline; no thyroid nodularity  LUNGS: respiratory effort normal & unlabored; no wheeze; no rales; no tactile fremitus  CV: rate and rhythm regular; no palpable thrills; no murmur; no edema bilat lower extremities  GI: abdomen is soft, wounds dry and intact; few BS present; non-tender; no hepatosplenomegaly; no obvious hernia  MSK: normal range of motion of extremities; no clubbing; no cyanosis  PSYCH: appropriate affect for situation; alert and oriented to person, place, & time  LYMPHATIC: no palpable cervical lymphadenopathy; no evidence lymphedema in extremities     Assessment/Plan History of roux-en-y gastric bypass procedure, 01/2021 - Dr. Sheliah Hatch Proximal gastric obstruction Hypokalemia  I discussed case with Dr. Sheliah Hatch.  Will proceed with upper GI series to evaluate.  Protonix BID.  May need upper endoscopy early next week.  IV hydration. Replete KCL.  Darnell Level, MD [facility] Office: [phone]   Darnell Level, MD 03/09/2021, 9:12 AM

## 2021-03-09 NOTE — Discharge Instructions (Signed)
Clear liquid diet only Call office Monday for follow up Call with worsening pain, nausea vomiting or wretching

## 2021-03-09 NOTE — Progress Notes (Signed)
Patient ID: Hector Curry, male   DOB: [birth year], 60 y.o.   MRN: [id number]    UGI reveals stricture at previous band site with delayed passage of contrast  Pt states he feels better than a couple of days ago and desires discharge He swallowing was worse Thursday compared to today He will need endoscopy and possible dilation if this does not resolve.  He is hemodynamically stable and has a benign abdominal exam.  He has no retching , chest pain, vomiting or abdominal pain  Plan for discharge on clear liquids and follow up with Dr Sheliah Hatch next week Return to hospital if anything changes

## 2021-03-09 NOTE — ED Provider Notes (Signed)
[facility]-EMERGENCY DEPT Provider Note   CSN: [id number] Arrival date & time: 03/09/21  0102     History Chief Complaint  Patient presents with   Vomiting   Emesis    Hector Curry is a 60 y.o. male.  Patient presents to the emergency department with a chief complaint of nausea and vomiting.  He is status post 3 weeks bariatric surgery, Roux-en-Y gastric bypass.  Patient states that for the past 3 days he has been unable to tolerate any oral intake.  He became more concerned today when he had not been able to keep down any water.  He contacted his surgeon, and was advised to come to the emergency department for evaluation and CT imaging.  Patient denies being in any pain.  Denies any fever or chills.  The history is provided by the patient. No language interpreter was used.      Past Medical History:  Diagnosis Date   Allergic rhinitis    Atrial fibrillation (HCC)    Coronary artery disease, non-occlusive    a. 05/08/2015 Cath: no significant CAD, LVEF nl-->Med; b. 07/2017 MV: attenuation artifact, no ischemia, EF 65%-->Low risk.   Diabetes mellitus without complication (HCC)    Diastolic dysfunction    a. 04/2015 Echo: EF 60-65%, mild MR, mildly dil LA, nl RV fxn, nl PASP; b. 05/2016 Echo: EF 60-65%, no rmwa, Gr1 DD, mildly dil Ao root and asc Ao; c. 07/2017 Echo: EF 55-60%, Ao root 35mm. mildly dil LA; d. 03/2019 Echo: EF 55-60%, nl RV fxn. Mild LAE, Ao root/Asc Ao 72mm.   Dilated aortic root (HCC)    a. 07/2017 Echo: 3mm Ao root - mildly dil; b. 03/2019 Echo: Ao root 39mm.   Diverticulosis 10/03/2014   Dysrhythmia    Family history of premature CAD    a. father passed from MI at 43   GERD (gastroesophageal reflux disease)    Hemorrhoids    a. internal hemorrhoids s/p surgery 1999   History of kidney stones    History of tobacco abuse    Hyperlipidemia    Hyperplastic colon polyp 10/03/2014   a. x 2    Hypertension    Inflammatory arthritis     a. CCP antibodies & x-rays negative. Rheumatoid factor 14, felt to be crystaline over RA or psoriatic   Morbid obesity (HCC)    a. s/p LAP-BAND   OSA (obstructive sleep apnea)    a. on CPAP   Osteoarthritis    PSVT (paroxysmal supraventricular tachycardia) (HCC)    a. 48 hr Holter 04/2015: NSR w/ rare PVC, short runs of narrow complex tachycardiac, possible atrial tach, longest run 7 beats, PACs noted (2% of all beats 3600 total) they did not seem to correlate w/ significant arrythmia; b. 08/2017 Event monitor: no significant arrhythmias.    Patient Active Problem List   Diagnosis Date Noted   Morbid obesity (HCC) 02/18/2021   History of kidney stones 05/30/2016   Elevated LFTs 02/13/2016   Morbid obesity with BMI of 40.0-44.9, adult (HCC) 12/20/2015   Atrial tachycardia (HCC) 06/21/2015   Hypertriglyceridemia 05/15/2015   Coronary artery disease, non-occlusive    Type II diabetes mellitus, uncontrolled (HCC) 05/08/2015   Chest pain at rest    Hyperglycemia 05/07/2015   Chest pain 05/06/2015   GERD (gastroesophageal reflux disease)    Kidney stones    Hemorrhoids    Inflammatory arthritis    Family history of premature CAD    OSA (obstructive sleep  apnea)    History of tobacco abuse    Palpitations    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/20/2014   Diverticulosis 10/03/2014   Hyperplastic colon polyp 10/03/2014   HTN, goal below 140/90 08/07/2014   Pain in the chest 06/19/2014   SOB (shortness of breath) 06/19/2014   Obesity 06/19/2014   Hyperlipidemia 06/19/2014    Past Surgical History:  Procedure Laterality Date   BARIATRIC SURGERY     lap band    CARDIAC CATHETERIZATION N/A 05/08/2015   Procedure: Left Heart Cath and Coronary Angiography;  Surgeon: Corky Crafts, MD;  Location: [facility] INVASIVE CV LAB;  Service: Cardiovascular;  Laterality: N/A;   CARPAL TUNNEL RELEASE Left    CHOLECYSTECTOMY     COLONOSCOPY  06/27/2005   COLONOSCOPY  10/03/2014    ESOPHAGOGASTRODUODENOSCOPY  06/27/2005   GASTRIC ROUX-EN-Y N/A 02/18/2021   Procedure: LAPAROSCOPIC ROUX-EN-Y GASTRIC BYPASS WITH UPPER ENDOSCOPY,;  Surgeon: Rodman Pickle, MD;  Location: [facility] ORS;  Service: General;  Laterality: N/A;   HEMORRHOID SURGERY  08/25/1997   MOUTH SURGERY     removed area which was benign   TONSILLECTOMY         Family History  Problem Relation Age of Onset   Hypertension Mother    Diabetes Mother    Heart attack Father 63   CAD Father    Hypertension Sister    Diabetes Other    Hypertension Other    Heart disease Other     Social History   Tobacco Use   Smoking status: Former    Packs/day: 1.00    Years: 15.00    Pack years: 15.00    Types: Cigarettes    Quit date: 08/05/1989    Years since quitting: 31.6   Smokeless tobacco: Never  Vaping Use   Vaping Use: Never used  Substance Use Topics   Alcohol use: Never   Drug use: Never    Home Medications Prior to Admission medications   Medication Sig Start Date End Date Taking? Authorizing Provider  bisoprolol-hydrochlorothiazide [facility]) 10-6.25 MG tablet TAKE 1 TABLET BY MOUTH ONCE DAILY 11/13/20 11/13/21  Marguarite Arbour, MD  cetirizine (ZYRTEC) 10 MG tablet Take 10 mg by mouth daily.    [provider]  cholecalciferol (VITAMIN D) 1000 UNITS tablet Take 1,000 Units by mouth daily.     [provider]  diltiazem (CARDIZEM) 60 MG tablet Take 1 tablet (60 mg total) by mouth every 8 (eight) hours. 12/07/20 12/07/21  Creig Hines, NP  docusate sodium (COLACE) 100 MG capsule Take 100 mg by mouth at bedtime.    [provider]  enoxaparin (LOVENOX) 40 MG/0.4ML injection Inject 0.4 mLs (40 mg total) into the skin every 12 (twelve) hours for 14 days. 02/20/21 03/06/21  Kinsinger, De Blanch, MD  fenofibrate (TRICOR) 145 MG tablet Take 145 mg by mouth daily.  01/09/17   [provider]  Flaxseed, Linseed, (FLAXSEED OIL) 1200 MG CAPS Take 1,200 mg by  mouth daily.    [provider]  fluticasone (FLONASE) 50 MCG/ACT nasal spray Place 1 spray into both nostrils daily as needed for allergies or rhinitis.    [provider]  magnesium gluconate (MAGONATE) 500 MG tablet Take 500 mg by mouth daily.    [provider]  metFORMIN (GLUCOPHAGE) 850 MG tablet Take 1 tablet (850 mg total) by mouth 2 (two) times daily. 06/13/20     ondansetron (ZOFRAN-ODT) 4 MG disintegrating tablet Take 1 tablet (4 mg  total) by mouth every 6 (six) hours as needed for nausea or vomiting. 02/20/21   Kinsinger, De BlanchLuke Aaron, MD  OVER THE COUNTER MEDICATION Place 1 application into both ears. Mometasone flroate topical solution 0.1 % once per month    [provider]  pantoprazole (PROTONIX) 40 MG tablet Take 1 tablet (40 mg total) by mouth daily. 02/20/21   Kinsinger, De BlanchLuke Aaron, MD  potassium chloride SA (KLOR-CON) 20 MEQ tablet TAKE 1 TABLET BY MOUTH TWO TIMES DAILY Patient taking differently: Take 20 mEq by mouth 2 (two) times daily. 03/28/20 04/14/21  Antonieta IbaGollan, Timothy J, MD  propranolol (INDERAL) 20 MG tablet TAKE 1 TABLET BY MOUTH 3  TIMES DAILY AS NEEDED 06/02/17   Antonieta IbaGollan, Timothy J, MD  Propylene Glycol (SYSTANE BALANCE OP) Place 1 drop into both eyes 2 (two) times a week.    [provider]  QUERCETIN PO Take 500 mg by mouth daily at 8 pm.    [provider]  SODIUM FLUORIDE 5000 SENSITIVE 1.1-5 % GEL 1 application by Mouth Rinse route See admin instructions. 01/29/21   [provider]  Turmeric 500 MG CAPS Take 1,000 mg by mouth daily.    [provider]  zinc gluconate 50 MG tablet Take 50 mg by mouth daily.    [provider]    Allergies    Other  Review of Systems   Review of Systems  All other systems reviewed and are negative.  Physical Exam Updated Vital Signs BP (!) 142/90   Pulse 82   Temp 98.2 F (36.8 C) (Oral)   Resp 18   Ht 6\' 2"  (1.88 m)   SpO2 93%   BMI 37.26 kg/m    Physical Exam Vitals and nursing note reviewed.  Constitutional:      Appearance: He is well-developed.  HENT:     Head: Normocephalic and atraumatic.  Eyes:     Conjunctiva/sclera: Conjunctivae normal.  Cardiovascular:     Rate and Rhythm: Normal rate and regular rhythm.     Heart sounds: No murmur heard. Pulmonary:     Effort: Pulmonary effort is normal. No respiratory distress.     Breath sounds: Normal breath sounds.  Abdominal:     Palpations: Abdomen is soft. There is mass.     Tenderness: There is no abdominal tenderness.     Comments: Abdominal incisions are CDI Small mass palpated beneath central abdominal incision No erythema or discharge, no evidence of abscess  Musculoskeletal:        General: Normal range of motion.     Cervical back: Neck supple.  Skin:    General: Skin is warm and dry.  Neurological:     Mental Status: He is alert and oriented to person, place, and time.  Psychiatric:        Mood and Affect: Mood normal.        Behavior: Behavior normal.    ED Results / Procedures / Treatments   Labs (all labs ordered are listed, but only abnormal results are displayed) Labs Reviewed  CBC WITH DIFFERENTIAL/PLATELET  COMPREHENSIVE METABOLIC PANEL  LIPASE, BLOOD    EKG None  Radiology No results found.  Procedures Procedures   Medications Ordered in ED Medications - No data to display  ED Course  I have reviewed the triage vital signs and the nursing notes.  Pertinent labs & imaging results that were available during my care of the patient were reviewed by me and considered in my medical decision  making (see chart for details).    MDM Rules/Calculators/A&P                           Patient here with persistent vomiting over the past 3 days.  Is 3 weeks status post gastric bypass.  Pt. contacted the on-call surgeon and was advised to come to the ED for workup.  CT and labs ordered.  Lipase 84 K 3.3, Cl 96, possibly from mild  dehydration.  Discussed CT results with the patient.  Physical exam is notable for a small palpable mass in the anterior abdomen, as seen on CT impression point #2.  Patient states that this was significantly larger immediately after surgery, and has been improving each day.  Case discussed with Dr. Gerrit Friends, who will come to admit the patient for rehydration and further management.   Final Clinical Impression(s) / ED Diagnoses Final diagnoses:  Intractable vomiting without nausea, unspecified vomiting type    Rx / DC Orders ED Discharge Orders     None        Roxy Horseman, PA-C 03/09/21 0604    Molpus, Jonny Ruiz, MD 03/09/21 [phone]

## 2021-03-10 NOTE — Discharge Summary (Addendum)
Physician Discharge Summary  Patient ID: KAMERAN MCNEESE MRN: [id number] DOB/AGE: [birth year] 60 y.o.  Admit date: 03/09/2021 Discharge date: 03/09/2021  Admission Diagnoses: nausea  Discharge Diagnoses:  esophageal stricture  Principal Problem:   Gastric outlet obstruction Active Problems:   Morbid obesity (HCC)   History of Roux-en-Y gastric bypass   Discharged Condition: good  Hospital Course: Pt did well UGI showed a stricture at previous band site. He was able to swallow liquids and wished to be discharged.   Discussed he would need further work up as an outpatient and that if had any issues with liquids, he should call our office.  HE UNDERSTOOD AND SAID HE FELT BETTER AND WISHED TO RECOVER AT HOME.   Consults: None  Significant Diagnostic Studies: radiology:  UGI shows stricture at previous lap band site   delayed transfer of liquids noted up to 90 seconds.   Treatments: IV hydration  Discharge Exam: Blood pressure 134/69, pulse 72, temperature 98 F (36.7 C), temperature source Oral, resp. rate 18, height 6\' 2"  (1.88 m), SpO2 91 %. General appearance: alert and cooperative Resp: clear to auscultation bilaterally Cardio: regular rate and rhythm, S1, S2 normal, no murmur, click, rub or gallop GI: incisions  CDI Incision/Wound: incisions CDI SOFT nt  Disposition: Discharge disposition: 01-Home or Self Care       Discharge Instructions     Diet clear liquid   Complete by: As directed    Increase activity slowly   Complete by: As directed       Allergies as of 03/09/2021       Reactions   Other Other (See Comments)   Cats        Medication List     STOP taking these medications    enoxaparin 40 MG/0.4ML injection Commonly known as: LOVENOX       TAKE these medications    acetaminophen 500 MG tablet Commonly known as: TYLENOL Take 1,000 mg by mouth every 6 (six) hours as needed for moderate pain.   bisoprolol-hydrochlorothiazide 10-6.25  MG tablet Commonly known as: ZIAC TAKE 1 TABLET BY MOUTH ONCE DAILY   cetirizine 10 MG tablet Commonly known as: ZYRTEC Take 10 mg by mouth daily.   cholecalciferol 1000 units tablet Commonly known as: VITAMIN D Take 1,000 Units by mouth daily.   fenofibrate 145 MG tablet Commonly known as: TRICOR Take 145 mg by mouth daily.   Flaxseed Oil 1200 MG Caps Take 1,200 mg by mouth daily.   fluticasone 50 MCG/ACT nasal spray Commonly known as: FLONASE Place 1 spray into both nostrils daily as needed for allergies or rhinitis.   magnesium gluconate 500 MG tablet Commonly known as: MAGONATE Take 500 mg by mouth daily.   metFORMIN 850 MG tablet Commonly known as: GLUCOPHAGE Take 1 tablet (850 mg total) by mouth 2 (two) times daily.   ondansetron 4 MG disintegrating tablet Commonly known as: ZOFRAN-ODT Take 1 tablet (4 mg total) by mouth every 6 (six) hours as needed for nausea or vomiting. What changed: Another medication with the same name was added. Make sure you understand how and when to take each.   ondansetron 4 MG disintegrating tablet Commonly known as: ZOFRAN-ODT Take 1 tablet (4 mg total) by mouth every 6 (six) hours as needed for nausea. What changed: You were already taking a medication with the same name, and this prescription was added. Make sure you understand how and when to take each.   OVER THE COUNTER MEDICATION Place 1  application into both ears daily as needed (for irritation in ears). Mometasone flroate topical solution 0.1 % once per month   pantoprazole 40 MG tablet Commonly known as: Protonix Take 1 tablet (40 mg total) by mouth daily.   potassium chloride 20 MEQ packet Commonly known as: KLOR-CON Take 20 mEq by mouth 2 (two) times daily.   QUERCETIN PO Take 500 mg by mouth daily at 8 pm.   Sodium Fluoride 5000 Sensitive 1.1-5 % Gel Generic drug: Sod Fluoride-Potassium Nitrate 1 application by Mouth Rinse route 2 (two) times daily.   SYSTANE  BALANCE OP Place 1 drop into both eyes daily as needed (dry eyes).   Turmeric 500 MG Caps Take 1,000 mg by mouth daily.   zinc gluconate 50 MG tablet Take 50 mg by mouth daily.       ASK your doctor about these medications    diltiazem 60 MG tablet Commonly known as: CARDIZEM Take 1 tablet (60 mg total) by mouth every 8 (eight) hours.   potassium chloride SA 20 MEQ tablet Commonly known as: KLOR-CON TAKE 1 TABLET BY MOUTH TWO TIMES DAILY   propranolol 20 MG tablet Commonly known as: INDERAL TAKE 1 TABLET BY MOUTH 3  TIMES DAILY AS NEEDED         Signed: Clovis Pu Sparsh Callens 03/10/2021, 7:38 AM

## 2021-03-11 ENCOUNTER — Telehealth: Payer: Self-pay | Admitting: Skilled Nursing Facility1

## 2021-03-11 ENCOUNTER — Other Ambulatory Visit (HOSPITAL_COMMUNITY): Payer: Self-pay

## 2021-03-11 NOTE — Telephone Encounter (Signed)
RD called pt to verify fluid intake once starting soft, solid proteins 2 week post-bariatric surgery.   Daily Fluid intake: 64 oz Daily Protein intake: 40 grams Bowel Habits:   Concerns/issues:   Had an ED visit due to obstruction. Currently back to liquids until dilation surgery.  Advised:  Get protein goals made  Impressed importance of meeting protein goal

## 2021-03-13 ENCOUNTER — Other Ambulatory Visit: Payer: Self-pay

## 2021-03-13 ENCOUNTER — Emergency Department (HOSPITAL_COMMUNITY)
Admission: EM | Admit: 2021-03-13 | Discharge: 2021-03-13 | Disposition: A | Discharge status: Home / Self Care | Payer: No Typology Code available for payment source

## 2021-03-13 NOTE — ED Provider Notes (Cosign Needed Addendum)
Went to [facility] but patient was not in room   Hector Curry, [place] 03/13/21 2350

## 2021-03-13 NOTE — ED Triage Notes (Addendum)
Error

## 2021-03-17 ENCOUNTER — Other Ambulatory Visit: Payer: Self-pay | Admitting: Surgery

## 2021-03-17 ENCOUNTER — Other Ambulatory Visit: Payer: Self-pay

## 2021-03-17 ENCOUNTER — Inpatient Hospital Stay (HOSPITAL_COMMUNITY)
Admission: AD | Admit: 2021-03-17 | Discharge: 2021-03-25 | DRG: 394 | Disposition: A | Discharge status: Home Health | Payer: No Typology Code available for payment source | Attending: General Surgery | Admitting: General Surgery

## 2021-03-17 ENCOUNTER — Encounter (HOSPITAL_COMMUNITY): Payer: Self-pay | Admitting: General Surgery

## 2021-03-17 DIAGNOSIS — E785 Hyperlipidemia, unspecified: Secondary | ICD-10-CM | POA: Diagnosis present

## 2021-03-17 DIAGNOSIS — K222 Esophageal obstruction: Secondary | ICD-10-CM | POA: Diagnosis present

## 2021-03-17 DIAGNOSIS — Z8719 Personal history of other diseases of the digestive system: Secondary | ICD-10-CM | POA: Diagnosis not present

## 2021-03-17 DIAGNOSIS — Y838 Other surgical procedures as the cause of abnormal reaction of the patient, or of later complication, without mention of misadventure at the time of the procedure: Secondary | ICD-10-CM | POA: Diagnosis present

## 2021-03-17 DIAGNOSIS — K660 Peritoneal adhesions (postprocedural) (postinfection): Secondary | ICD-10-CM | POA: Diagnosis present

## 2021-03-17 DIAGNOSIS — M064 Inflammatory polyarthropathy: Secondary | ICD-10-CM | POA: Diagnosis present

## 2021-03-17 DIAGNOSIS — Z87891 Personal history of nicotine dependence: Secondary | ICD-10-CM

## 2021-03-17 DIAGNOSIS — Z79899 Other long term (current) drug therapy: Secondary | ICD-10-CM

## 2021-03-17 DIAGNOSIS — Z87442 Personal history of urinary calculi: Secondary | ICD-10-CM | POA: Diagnosis not present

## 2021-03-17 DIAGNOSIS — Z6836 Body mass index (BMI) 36.0-36.9, adult: Secondary | ICD-10-CM | POA: Diagnosis not present

## 2021-03-17 DIAGNOSIS — I480 Paroxysmal atrial fibrillation: Secondary | ICD-10-CM | POA: Diagnosis present

## 2021-03-17 DIAGNOSIS — I77810 Thoracic aortic ectasia: Secondary | ICD-10-CM | POA: Diagnosis present

## 2021-03-17 DIAGNOSIS — Z833 Family history of diabetes mellitus: Secondary | ICD-10-CM | POA: Diagnosis not present

## 2021-03-17 DIAGNOSIS — Z7984 Long term (current) use of oral hypoglycemic drugs: Secondary | ICD-10-CM | POA: Diagnosis not present

## 2021-03-17 DIAGNOSIS — Z8249 Family history of ischemic heart disease and other diseases of the circulatory system: Secondary | ICD-10-CM

## 2021-03-17 DIAGNOSIS — I2510 Atherosclerotic heart disease of native coronary artery without angina pectoris: Secondary | ICD-10-CM | POA: Diagnosis present

## 2021-03-17 DIAGNOSIS — K219 Gastro-esophageal reflux disease without esophagitis: Secondary | ICD-10-CM | POA: Diagnosis present

## 2021-03-17 DIAGNOSIS — G4733 Obstructive sleep apnea (adult) (pediatric): Secondary | ICD-10-CM | POA: Diagnosis present

## 2021-03-17 DIAGNOSIS — E1165 Type 2 diabetes mellitus with hyperglycemia: Secondary | ICD-10-CM | POA: Diagnosis present

## 2021-03-17 DIAGNOSIS — Z20822 Contact with and (suspected) exposure to covid-19: Secondary | ICD-10-CM | POA: Diagnosis present

## 2021-03-17 DIAGNOSIS — I119 Hypertensive heart disease without heart failure: Secondary | ICD-10-CM | POA: Diagnosis present

## 2021-03-17 DIAGNOSIS — E860 Dehydration: Secondary | ICD-10-CM | POA: Diagnosis present

## 2021-03-17 DIAGNOSIS — K9509 Other complications of gastric band procedure: Principal | ICD-10-CM | POA: Diagnosis present

## 2021-03-17 DIAGNOSIS — E440 Moderate protein-calorie malnutrition: Secondary | ICD-10-CM | POA: Diagnosis present

## 2021-03-17 DIAGNOSIS — Z7982 Long term (current) use of aspirin: Secondary | ICD-10-CM

## 2021-03-17 LAB — BASIC METABOLIC PANEL
Anion gap: 9 (ref 5–15)
BUN: 8 mg/dL (ref 6–20)
CO2: 32 mmol/L (ref 22–32)
Calcium: 8.7 mg/dL — ABNORMAL LOW (ref 8.9–10.3)
Chloride: 101 mmol/L (ref 98–111)
Creatinine, Ser: 0.74 mg/dL (ref 0.61–1.24)
GFR, Estimated: >60 mL/min (ref >60)
Glucose, Bld: 145 mg/dL — ABNORMAL HIGH (ref 70–99)
Potassium: 3.2 mmol/L — ABNORMAL LOW (ref 3.5–5.1)
Sodium: 142 mmol/L (ref 135–145)

## 2021-03-17 LAB — CBC
HCT: 44.2 % (ref 39.0–52.0)
Hemoglobin: 14.1 g/dL (ref 13.0–17.0)
MCH: 28.6 pg (ref 26.0–34.0)
MCHC: 31.9 g/dL (ref 30.0–36.0)
MCV: 89.7 fL (ref 80.0–100.0)
Platelets: 160 10*3/uL (ref 150–400)
RBC: 4.93 MIL/uL (ref 4.22–5.81)
RDW: 14.8 % (ref 11.5–15.5)
WBC: 5.5 10*3/uL (ref 4.0–10.5)
nRBC: 0 % (ref 0.0–0.2)

## 2021-03-17 LAB — MAGNESIUM: Magnesium: 1.8 mg/dL (ref 1.7–2.4)

## 2021-03-17 LAB — GLUCOSE, CAPILLARY
Glucose-Capillary: 126 mg/dL — ABNORMAL HIGH (ref 70–99)
Glucose-Capillary: 129 mg/dL — ABNORMAL HIGH (ref 70–99)
Glucose-Capillary: 130 mg/dL — ABNORMAL HIGH (ref 70–99)
Glucose-Capillary: 142 mg/dL — ABNORMAL HIGH (ref 70–99)

## 2021-03-17 LAB — PHOSPHORUS: Phosphorus: 3.6 mg/dL (ref 2.5–4.6)

## 2021-03-17 MED ORDER — OXYCODONE HCL 5 MG PO TABS: 5.0000 mg | ORAL_TABLET | ORAL | Status: DC | PRN

## 2021-03-17 MED ORDER — ACETAMINOPHEN 325 MG PO TABS: 650.0000 mg | ORAL_TABLET | Freq: Four times a day (QID) | ORAL | Status: DC | PRN

## 2021-03-17 MED ORDER — BISOPROLOL-HYDROCHLOROTHIAZIDE 10-6.25 MG PO TABS
1.0000 | ORAL_TABLET | Freq: Every day | ORAL | Status: DC
  Administered 2021-03-21 – 2021-03-25 (×5): 1 via ORAL
  Filled 2021-03-17 (×9): qty 1

## 2021-03-17 MED ORDER — INSULIN ASPART 100 UNIT/ML IJ SOLN
0.0000 [IU] | INTRAMUSCULAR | Status: DC
  Administered 2021-03-17 (×3): 2 [IU] via SUBCUTANEOUS
  Administered 2021-03-18: 3 [IU] via SUBCUTANEOUS
  Administered 2021-03-18 (×4): 2 [IU] via SUBCUTANEOUS
  Administered 2021-03-19 (×2): 3 [IU] via SUBCUTANEOUS
  Administered 2021-03-19 (×3): 2 [IU] via SUBCUTANEOUS
  Administered 2021-03-20 (×2): 3 [IU] via SUBCUTANEOUS
  Administered 2021-03-20: 8 [IU] via SUBCUTANEOUS
  Administered 2021-03-20 (×2): 2 [IU] via SUBCUTANEOUS
  Administered 2021-03-21: 8 [IU] via SUBCUTANEOUS
  Administered 2021-03-21: 5 [IU] via SUBCUTANEOUS
  Administered 2021-03-21 (×2): 3 [IU] via SUBCUTANEOUS
  Administered 2021-03-21: 5 [IU] via SUBCUTANEOUS
  Administered 2021-03-22 (×3): 3 [IU] via SUBCUTANEOUS
  Administered 2021-03-22 (×2): 2 [IU] via SUBCUTANEOUS
  Administered 2021-03-22: 5 [IU] via SUBCUTANEOUS
  Administered 2021-03-23: 3 [IU] via SUBCUTANEOUS
  Administered 2021-03-23: 2 [IU] via SUBCUTANEOUS
  Administered 2021-03-23: 3 [IU] via SUBCUTANEOUS
  Administered 2021-03-23 (×2): 2 [IU] via SUBCUTANEOUS
  Administered 2021-03-23 – 2021-03-24 (×3): 3 [IU] via SUBCUTANEOUS
  Administered 2021-03-24: 2 [IU] via SUBCUTANEOUS
  Administered 2021-03-24 – 2021-03-25 (×3): 3 [IU] via SUBCUTANEOUS
  Administered 2021-03-25: 2 [IU] via SUBCUTANEOUS
  Administered 2021-03-25: 3 [IU] via SUBCUTANEOUS

## 2021-03-17 MED ORDER — ACETAMINOPHEN 500 MG PO TABS: 1000.0000 mg | ORAL_TABLET | Freq: Four times a day (QID) | ORAL | Status: DC | PRN

## 2021-03-17 MED ORDER — MORPHINE SULFATE (PF) 2 MG/ML IV SOLN
2.0000 mg | INTRAVENOUS | Status: DC | PRN
  Administered 2021-03-20: 2 mg via INTRAVENOUS
  Filled 2021-03-17: qty 1

## 2021-03-17 MED ORDER — DIPHENHYDRAMINE HCL 25 MG PO CAPS: 25.0000 mg | ORAL_CAPSULE | Freq: Four times a day (QID) | ORAL | Status: DC | PRN

## 2021-03-17 MED ORDER — METOPROLOL TARTRATE 5 MG/5ML IV SOLN
5.0000 mg | Freq: Four times a day (QID) | INTRAVENOUS | Status: DC | PRN
Start: 2021-03-17 — End: 2021-03-25

## 2021-03-17 MED ORDER — SODIUM CHLORIDE 0.9 % IV SOLN
INTRAVENOUS | Status: DC | PRN
  Administered 2021-03-17: 250 mL via INTRAVENOUS

## 2021-03-17 MED ORDER — DIPHENHYDRAMINE HCL 50 MG/ML IJ SOLN: 25.0000 mg | Freq: Four times a day (QID) | INTRAMUSCULAR | Status: DC | PRN

## 2021-03-17 MED ORDER — ENSURE MAX PROTEIN PO LIQD: 2.0000 [oz_av] | ORAL | Status: DC

## 2021-03-17 MED ORDER — SODIUM CHLORIDE 0.9 % IV BOLUS
1000.0000 mL | Freq: Once | INTRAVENOUS | Status: AC
  Administered 2021-03-17: 1000 mL via INTRAVENOUS

## 2021-03-17 MED ORDER — DILTIAZEM HCL 60 MG PO TABS
60.0000 mg | ORAL_TABLET | Freq: Three times a day (TID) | ORAL | Status: DC
  Administered 2021-03-20 – 2021-03-22 (×5): 60 mg via ORAL
  Filled 2021-03-17 (×15): qty 1

## 2021-03-17 MED ORDER — ACETAMINOPHEN 650 MG RE SUPP: 650.0000 mg | Freq: Four times a day (QID) | RECTAL | Status: DC | PRN

## 2021-03-17 MED ORDER — ONDANSETRON HCL 4 MG/2ML IJ SOLN
4.0000 mg | Freq: Four times a day (QID) | INTRAMUSCULAR | Status: DC | PRN
  Administered 2021-03-19 – 2021-03-24 (×3): 4 mg via INTRAVENOUS
  Filled 2021-03-17 (×3): qty 2

## 2021-03-17 MED ORDER — PANTOPRAZOLE SODIUM 40 MG IV SOLR
40.0000 mg | Freq: Two times a day (BID) | INTRAVENOUS | Status: DC
  Administered 2021-03-17 – 2021-03-23 (×14): 40 mg via INTRAVENOUS
  Filled 2021-03-17 (×14): qty 40

## 2021-03-17 MED ORDER — ONDANSETRON 4 MG PO TBDP: 4.0000 mg | ORAL_TABLET | Freq: Four times a day (QID) | ORAL | Status: DC | PRN

## 2021-03-17 MED ORDER — POTASSIUM CHLORIDE 10 MEQ/100ML IV SOLN
10.0000 meq | INTRAVENOUS | Status: AC
Start: 2021-03-17 — End: 2021-03-17
  Administered 2021-03-17 (×3): 10 meq via INTRAVENOUS
  Filled 2021-03-17 (×3): qty 100

## 2021-03-17 MED ORDER — FLUTICASONE PROPIONATE 50 MCG/ACT NA SUSP
1.0000 | Freq: Every day | NASAL | Status: DC | PRN
  Filled 2021-03-17: qty 16

## 2021-03-17 MED ORDER — LORATADINE 10 MG PO TABS
10.0000 mg | ORAL_TABLET | Freq: Every day | ORAL | Status: DC
  Administered 2021-03-21: 10 mg via ORAL
  Filled 2021-03-17 (×2): qty 1

## 2021-03-17 MED ORDER — PROPRANOLOL HCL 20 MG PO TABS
20.0000 mg | ORAL_TABLET | Freq: Three times a day (TID) | ORAL | Status: DC | PRN
  Filled 2021-03-17: qty 1

## 2021-03-17 MED ORDER — ENOXAPARIN SODIUM 40 MG/0.4ML IJ SOSY
40.0000 mg | PREFILLED_SYRINGE | Freq: Every day | INTRAMUSCULAR | Status: DC
  Administered 2021-03-17 – 2021-03-25 (×8): 40 mg via SUBCUTANEOUS
  Filled 2021-03-17 (×8): qty 0.4

## 2021-03-17 MED ORDER — DEXTROSE-NACL 5-0.45 % IV SOLN: INTRAVENOUS | Status: DC

## 2021-03-17 NOTE — H&P (Addendum)
Surgical Evaluation  Chief Complaint: P.o. intolerance  HPI: 60 year old man who is approximately 5 weeks status post removal of lap band and conversion to Roux-en-Y gastric bypass.  He is readmitted for the second time with p.o. intolerance.  At last admission which was only 1 day, he did have a CT scan without significant abnormality.  Upper GI revealed narrowing at the band site with delayed passage of contrast.  He did have some improvement in his symptoms and was discharged home 8 days ago.  The patient endorses ongoing intolerance to liquids.   Allergies  Allergen Reactions   Other Other (See Comments)    Cats    Past Medical History:  Diagnosis Date   Allergic rhinitis    Atrial fibrillation (HCC)    Coronary artery disease, non-occlusive    a. 05/08/2015 Cath: no significant CAD, LVEF nl-->Med; b. 07/2017 MV: attenuation artifact, no ischemia, EF 65%-->Low risk.   Diabetes mellitus without complication (HCC)    Diastolic dysfunction    a. 04/2015 Echo: EF 60-65%, mild MR, mildly dil LA, nl RV fxn, nl PASP; b. 05/2016 Echo: EF 60-65%, no rmwa, Gr1 DD, mildly dil Ao root and asc Ao; c. 07/2017 Echo: EF 55-60%, Ao root 33mm. mildly dil LA; d. 03/2019 Echo: EF 55-60%, nl RV fxn. Mild LAE, Ao root/Asc Ao 84mm.   Dilated aortic root (HCC)    a. 07/2017 Echo: 45mm Ao root - mildly dil; b. 03/2019 Echo: Ao root 58mm.   Diverticulosis 10/03/2014   Dysrhythmia    Family history of premature CAD    a. father passed from MI at 15   GERD (gastroesophageal reflux disease)    Hemorrhoids    a. internal hemorrhoids s/p surgery 1999   History of kidney stones    History of tobacco abuse    Hyperlipidemia    Hyperplastic colon polyp 10/03/2014   a. x 2    Hypertension    Inflammatory arthritis    a. CCP antibodies & x-rays negative. Rheumatoid factor 14, felt to be crystaline over RA or psoriatic   Morbid obesity (HCC)    a. s/p LAP-BAND   OSA (obstructive sleep apnea)    a. on CPAP    Osteoarthritis    PSVT (paroxysmal supraventricular tachycardia) (HCC)    a. 48 hr Holter 04/2015: NSR w/ rare PVC, short runs of narrow complex tachycardiac, possible atrial tach, longest run 7 beats, PACs noted (2% of all beats 3600 total) they did not seem to correlate w/ significant arrythmia; b. 08/2017 Event monitor: no significant arrhythmias.    Past Surgical History:  Procedure Laterality Date   BARIATRIC SURGERY     lap band    CARDIAC CATHETERIZATION N/A 05/08/2015   Procedure: Left Heart Cath and Coronary Angiography;  Surgeon: Corky Crafts, MD;  Location: [facility] INVASIVE CV LAB;  Service: Cardiovascular;  Laterality: N/A;   CARPAL TUNNEL RELEASE Left    CHOLECYSTECTOMY     COLONOSCOPY  06/27/2005   COLONOSCOPY  10/03/2014   ESOPHAGOGASTRODUODENOSCOPY  06/27/2005   GASTRIC ROUX-EN-Y N/A 02/18/2021   Procedure: LAPAROSCOPIC ROUX-EN-Y GASTRIC BYPASS WITH UPPER ENDOSCOPY,;  Surgeon: Sheliah Hatch De Blanch, MD;  Location: [facility] ORS;  Service: General;  Laterality: N/A;   HEMORRHOID SURGERY  08/25/1997   MOUTH SURGERY     removed area which was benign   TONSILLECTOMY      Family History  Problem Relation Age of Onset   Hypertension Mother    Diabetes Mother    Heart  attack Father [id number]   CAD Father    Hypertension Sister    Diabetes Other    Hypertension Other    Heart disease Other     Social History   Socioeconomic History   Marital status: Married    Spouse name: Not on file   Number of children: Not on file   Years of education: Not on file   Highest education level: Not on file  Occupational History   Not on file  Tobacco Use   Smoking status: Former    Packs/day: 1.00    Years: 15.00    Pack years: 15.00    Types: Cigarettes    Quit date: 08/05/1989    Years since quitting: 31.6   Smokeless tobacco: Never  Vaping Use   Vaping Use: Never used  Substance and Sexual Activity   Alcohol use: Never   Drug use: Never   Sexual activity: Not on file  Other  Topics Concern   Not on file  Social History Narrative   Not on file   Social Determinants of Health   Financial Resource Strain: Not on file  Food Insecurity: Not on file  Transportation Needs: Not on file  Physical Activity: Not on file  Stress: Not on file  Social Connections: Not on file    No current facility-administered medications on file prior to encounter.   Current Outpatient Medications on File Prior to Encounter  Medication Sig Dispense Refill   acetaminophen (TYLENOL) 500 MG tablet Take 1,000 mg by mouth every 6 (six) hours as needed for moderate pain.     aspirin EC 81 MG tablet Take 81 mg by mouth daily. Swallow whole.     bisoprolol-hydrochlorothiazide (ZIAC) 10-6.25 MG tablet TAKE 1 TABLET BY MOUTH ONCE DAILY 90 tablet 3   cetirizine (ZYRTEC) 10 MG tablet Take 10 mg by mouth daily.     cholecalciferol (VITAMIN D) 1000 UNITS tablet Take 1,000 Units by mouth daily.      diltiazem (CARDIZEM) 60 MG tablet Take 1 tablet (60 mg total) by mouth every 8 (eight) hours. (Patient taking differently: Take 60 mg by mouth daily as needed (for high blood pressure).) 270 tablet 1   docusate sodium (COLACE) 100 MG capsule Take 100 mg by mouth daily as needed for mild constipation.     fenofibrate (TRICOR) 145 MG tablet Take 145 mg by mouth daily.      Flaxseed, Linseed, (FLAXSEED OIL) 1200 MG CAPS Take 1,200 mg by mouth daily.     fluticasone (FLONASE) 50 MCG/ACT nasal spray Place 1 spray into both nostrils daily as needed for allergies or rhinitis.     magnesium gluconate (MAGONATE) 500 MG tablet Take 500 mg by mouth daily.     metFORMIN (GLUCOPHAGE) 850 MG tablet Take 1 tablet (850 mg total) by mouth 2 (two) times daily. 180 tablet 1   ondansetron (ZOFRAN-ODT) 4 MG disintegrating tablet Dissolve1 tablet (4 mg total) by mouth every 6 (six) hours as needed for nausea. 20 tablet 0   OVER THE COUNTER MEDICATION Place 1 application into both ears daily as needed (for irritation in  ears). Mometasone flroate topical solution 0.1 % once per month     pantoprazole (PROTONIX) 40 MG tablet Take 1 tablet (40 mg total) by mouth daily. 30 tablet 1   pioglitazone (ACTOS) 30 MG tablet Take 30 mg by mouth daily.     potassium chloride SA (KLOR-CON) 20 MEQ tablet TAKE 1 TABLET BY MOUTH TWO TIMES DAILY (Patient  taking differently: Take 20 mEq by mouth 2 (two) times daily.) 180 tablet 3   propranolol (INDERAL) 20 MG tablet TAKE 1 TABLET BY MOUTH 3  TIMES DAILY AS NEEDED (Patient taking differently: Take 20 mg by mouth daily as needed (for high blood pressure).) 270 tablet 2   Propylene Glycol (SYSTANE BALANCE OP) Place 1 drop into both eyes daily as needed (dry eyes).     QUERCETIN PO Take 500 mg by mouth daily at 8 pm.     SODIUM FLUORIDE 5000 SENSITIVE 1.1-5 % GEL 1 application by Mouth Rinse route 2 (two) times daily.     Turmeric 500 MG CAPS Take 1,000 mg by mouth daily.     zinc gluconate 50 MG tablet Take 50 mg by mouth daily.     ondansetron (ZOFRAN-ODT) 4 MG disintegrating tablet Take 1 tablet (4 mg total) by mouth every 6 (six) hours as needed for nausea or vomiting. (Patient not taking: Reported on 03/17/2021) 20 tablet 0    Review of Systems: a complete, 10pt review of systems was completed with pertinent positives and negatives as documented in the HPI  Physical Exam: Vitals:   03/17/21 1006  BP: (!) 136/98  Pulse: (!) 103  Resp: 18  Temp: 98.2 F (36.8 C)  SpO2: 95%   Gen: A&Ox3, no distress  Eyes: Extraocular motions intact, no scleral icterus.  Chest: respiratory effort is normal. No crepitus or tenderness on palpation of the chest.  Cardiovascular: RRR with palpable distal pulses, no pedal edema Gastrointestinal: soft, nondistended, nontender. No mass, hepatomegaly or splenomegaly.  Incisions healing well without complication Lymphatic: no lymphadenopathy in the neck or groin Muscoloskeletal: no clubbing or cyanosis of the fingers.  Strength is symmetrical  throughout.  Range of motion of bilateral upper and lower extremities normal without pain, crepitation or contracture. Neuro: cranial nerves grossly intact.  Sensation intact to light touch diffusely. Psych: appropriate mood and affect, normal insight/judgment intact  Skin: warm and dry   CBC Latest Ref Rng & Units 03/09/2021 03/09/2021 03/06/2021  WBC 4.0 - 10.5 K/uL 7.2 7.4 8.4  Hemoglobin 13.0 - 17.0 g/dL 53.9 12.2 58.3  Hematocrit 39.0 - 52.0 % 42.7 44.1 44.2  Platelets 150 - 400 K/uL 202 252 272    CMP Latest Ref Rng & Units 03/09/2021 03/09/2021 03/06/2021  Glucose 70 - 99 mg/dL - 462(T) 947(X)  BUN 6 - 20 mg/dL - 11 8  Creatinine 2.52 - 1.24 mg/dL 7.12 9.29 0.90  Sodium 135 - 145 mmol/L - 141 137  Potassium 3.5 - 5.1 mmol/L - 3.3(L) 3.0(L)  Chloride 98 - 111 mmol/L - 96(L) 95(L)  CO2 22 - 32 mmol/L - 29 27  Calcium 8.9 - 10.3 mg/dL - 9.1 3.0(B)  Total Protein 6.5 - 8.1 g/dL - 7.5 7.2  Total Bilirubin 0.3 - 1.2 mg/dL - 1.7(H) 1.6(H)  Alkaline Phos 38 - 126 U/L - 53 54  AST 15 - 41 U/L - 24 26  ALT 0 - 44 U/L - 32 38    Lab Results  Component Value Date   INR 1.13 05/08/2015    Imaging: No results found.   A/P: 5-week status post lap band removal and conversion to Roux-en-Y gastric bypass with residual stricture at site of prior Lap-Band, likely due to residual scarring versus edema although that is less likely this far out from surgery.  Check labs, IV fluid resuscitation, IV PPI.  Endoscopic dilation may be tricky at this point given proximity to fresh  gastrojejunal anastomosis, as would attempt at surgical revision to release the cicatrix.  Dr. Sheliah Hatch will meet with the patient to discuss further likely tomorrow.    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   Morbid obesity (HCC) 02/18/2021   History of kidney stones 05/30/2016   Elevated  LFTs 02/13/2016   Morbid obesity with BMI of 40.0-44.9, adult (HCC) 12/20/2015   Atrial tachycardia (HCC) 06/21/2015   Hypertriglyceridemia 05/15/2015   Coronary artery disease, non-occlusive    Type II diabetes mellitus, uncontrolled (HCC) 05/08/2015   Chest pain at rest    Hyperglycemia 05/07/2015   Chest pain 05/06/2015   GERD (gastroesophageal reflux disease)    Kidney stones    Hemorrhoids    Inflammatory arthritis    Family history of premature CAD    OSA (obstructive sleep apnea)    History of tobacco abuse    Palpitations    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/20/2014   Diverticulosis 10/03/2014   Hyperplastic colon polyp 10/03/2014   HTN, goal below 140/90 08/07/2014   Pain in the chest 06/19/2014   SOB (shortness of breath) 06/19/2014   Obesity 06/19/2014   Hyperlipidemia 06/19/2014       Phylliss Blakes, MD [facility]  See AMION to contact appropriate on-call provider

## 2021-03-18 ENCOUNTER — Encounter (HOSPITAL_COMMUNITY): Payer: Self-pay | Admitting: General Surgery

## 2021-03-18 LAB — CBC
HCT: 42.3 % (ref 39.0–52.0)
Hemoglobin: 13.7 g/dL (ref 13.0–17.0)
MCH: 29.1 pg (ref 26.0–34.0)
MCHC: 32.4 g/dL (ref 30.0–36.0)
MCV: 90 fL (ref 80.0–100.0)
Platelets: 156 10*3/uL (ref 150–400)
RBC: 4.7 MIL/uL (ref 4.22–5.81)
RDW: 14.7 % (ref 11.5–15.5)
WBC: 5.4 10*3/uL (ref 4.0–10.5)
nRBC: 0 % (ref 0.0–0.2)

## 2021-03-18 LAB — GLUCOSE, CAPILLARY
Glucose-Capillary: 151 mg/dL — ABNORMAL HIGH (ref 70–99)
Glucose-Capillary: 146 mg/dL — ABNORMAL HIGH (ref 70–99)
Glucose-Capillary: 134 mg/dL — ABNORMAL HIGH (ref 70–99)
Glucose-Capillary: 146 mg/dL — ABNORMAL HIGH (ref 70–99)
Glucose-Capillary: 138 mg/dL — ABNORMAL HIGH (ref 70–99)

## 2021-03-18 LAB — COMPREHENSIVE METABOLIC PANEL
ALT: 25 U/L (ref 0–44)
AST: 21 U/L (ref 15–41)
Albumin: 3.1 g/dL — ABNORMAL LOW (ref 3.5–5.0)
Alkaline Phosphatase: 51 U/L (ref 38–126)
Anion gap: 9 (ref 5–15)
BUN: 6 mg/dL (ref 6–20)
CO2: 28 mmol/L (ref 22–32)
Calcium: 8.3 mg/dL — ABNORMAL LOW (ref 8.9–10.3)
Chloride: 101 mmol/L (ref 98–111)
Creatinine, Ser: 0.72 mg/dL (ref 0.61–1.24)
GFR, Estimated: >60 mL/min (ref >60)
Glucose, Bld: 155 mg/dL — ABNORMAL HIGH (ref 70–99)
Potassium: 3.2 mmol/L — ABNORMAL LOW (ref 3.5–5.1)
Sodium: 138 mmol/L (ref 135–145)
Total Bilirubin: 1.1 mg/dL (ref 0.3–1.2)
Total Protein: 6.2 g/dL — ABNORMAL LOW (ref 6.5–8.1)

## 2021-03-18 MED ORDER — POTASSIUM CHLORIDE 10 MEQ/100ML IV SOLN
10.0000 meq | Freq: Once | INTRAVENOUS | Status: AC
  Administered 2021-03-18: 10 meq via INTRAVENOUS
  Filled 2021-03-18: qty 100

## 2021-03-18 MED ORDER — POTASSIUM CHLORIDE 10 MEQ/100ML IV SOLN
10.0000 meq | INTRAVENOUS | Status: AC
Start: 2021-03-18 — End: 2021-03-19
  Administered 2021-03-18 – 2021-03-19 (×4): 10 meq via INTRAVENOUS
  Filled 2021-03-18: qty 100

## 2021-03-18 NOTE — Progress Notes (Signed)
      Chief Complaint/Subjective: Feeling better today, tolerated small amount of water, spitting up some saliva  Review of Systems See above, otherwise other systems negative   PMH -  has a past medical history of Allergic rhinitis, Atrial fibrillation (HCC), Coronary artery disease, non-occlusive, Diabetes mellitus without complication (HCC), Diastolic dysfunction, Dilated aortic root (HCC), Diverticulosis (10/03/2014), Dysrhythmia, Family history of premature CAD, GERD (gastroesophageal reflux disease), Hemorrhoids, History of kidney stones, History of tobacco abuse, Hyperlipidemia, Hyperplastic colon polyp (10/03/2014), Hypertension, Inflammatory arthritis, Morbid obesity (HCC), OSA (obstructive sleep apnea), Osteoarthritis, and PSVT (paroxysmal supraventricular tachycardia) (HCC). PSH - @ has a past surgical history that includes Bariatric Surgery; Cholecystectomy; Tonsillectomy; Hemorrhoid surgery (08/25/1997); Carpal tunnel release (Left); Colonoscopy (06/27/2005); Colonoscopy (10/03/2014); Esophagogastroduodenoscopy (06/27/2005); Cardiac catheterization (N/A, 05/08/2015); Mouth surgery; and Gastric Roux-En-Y (N/A, 02/18/2021).  [facility] - family history includes CAD in his father; Diabetes in his mother and another family member; Heart attack (age of onset: 15) in his father; Heart disease in an other family member; Hypertension in his mother, sister, and another family member.   Objective: Vital signs in last 24 hours: Temp:  [97.4 F (36.3 C)-98.3 F (36.8 C)] 97.4 F (36.3 C) (07/25 0544) Pulse Rate:  [73-103] 73 (07/25 0544) Resp:  [16-18] 18 (07/25 0544) BP: (130-147)/(67-98) 147/67 (07/25 0544) SpO2:  [90 %-95 %] 94 % (07/25 0544) Weight:  [131 kg] 131 kg (07/25 0700) Last BM Date:  (week or more per patient) Intake/Output from previous day: 07/24 0701 - 07/25 0700 In: 2734.1 [P.O.:280; I.V.:2138.5; IV Piggyback:15.6] Out: 200 [Urine:200] Intake/Output this shift: No  intake/output data recorded.  PE: Gen: NAD Resp: nonlaboreed Card: RRR Abd: soft, NT, ND  Lab Results:  Recent Labs    03/17/21 1321 03/18/21 0359  WBC 5.5 5.4  HGB 14.1 13.7  HCT 44.2 42.3  PLT 160 156   BMET Recent Labs    03/17/21 1321 03/18/21 0359  NA 142 138  K 3.2* 3.2*  CL 101 101  CO2 32 28  GLUCOSE 145* 155*  BUN 8 6  CREATININE 0.74 0.72  CALCIUM 8.7* 8.3*   PT/INR No results for input(s): LABPROT, INR in the last 72 hours. CMP     Component Value Date/Time   NA 138 03/18/2021 0359   K 3.2 (L) 03/18/2021 0359   CL 101 03/18/2021 0359   CO2 28 03/18/2021 0359   GLUCOSE 155 (H) 03/18/2021 0359   BUN 6 03/18/2021 0359   CREATININE 0.72 03/18/2021 0359   CALCIUM 8.3 (L) 03/18/2021 0359   PROT 6.2 (L) 03/18/2021 0359   ALBUMIN 3.1 (L) 03/18/2021 0359   AST 21 03/18/2021 0359   ALT 25 03/18/2021 0359   ALKPHOS 51 03/18/2021 0359   BILITOT 1.1 03/18/2021 0359   GFRNONAA >60 03/18/2021 0359   GFRAA >60 05/22/2020 0827   Lipase     Component Value Date/Time   LIPASE 84 (H) 03/09/2021 0443    Studies/Results: No results found.  Anti-infectives: Anti-infectives (From admission, onward)    None       Assessment/Plan  s/p    S/p RNY gastric bypass and band removal with narrowing and dehydration -replace K -endoscopy later today  FEN - NPO for procedure VTE - lovenox ID - none Disposition - inpatient until improves   LOS: 1 day   De Blanch [facility] 03/18/2021, 7:53 AM Please see Amion for pager number during day hours 7:00am-4:30pm or 7:00am -11:30am on weekends

## 2021-03-18 NOTE — Progress Notes (Addendum)
F/u with patient and family earlier this morning. Patient was sleeping upon initial entry into the room. Wife was at bedside. Plan is for endoscopy procedure tomorrow 7/26. Discussed with bedside RN to document strict I&O to assist with monitoring pt's progression. Will f/u with pt and wife as needed. Will continue to monitor as needed.

## 2021-03-18 NOTE — Anesthesia Preprocedure Evaluation (Addendum)
Anesthesia Evaluation  Patient identified by MRN, date of birth, ID band Patient awake    Reviewed: Allergy & Precautions, NPO status , Patient's Chart, lab work & pertinent test results, reviewed documented beta blocker date and time   History of Anesthesia Complications Negative for: history of anesthetic complications  Airway Mallampati: III  TM Distance: >3 FB Neck ROM: Full    Dental  (+) Missing,    Pulmonary sleep apnea , former smoker,    Pulmonary exam normal        Cardiovascular hypertension, Pt. on medications and Pt. on home beta blockers + CAD  Normal cardiovascular exam+ dysrhythmias Atrial Fibrillation   TTE 2020: EF 55-60%, mild LAE, mild to moderate dilatation of aortic root and ascending aorta measuring 42 mm   Neuro/Psych negative neurological ROS  negative psych ROS   GI/Hepatic Neg liver ROS, GERD  ,  Endo/Other  diabetes, Type 2  Renal/GU negative Renal ROS  negative genitourinary   Musculoskeletal  (+) Arthritis ,   Abdominal   Peds  Hematology negative hematology ROS (+)   Anesthesia Other Findings Day of surgery medications reviewed with patient.  Reproductive/Obstetrics negative OB ROS                            Anesthesia Physical Anesthesia Plan  ASA: 3  Anesthesia Plan: MAC   Post-op Pain Management:    Induction:   PONV Risk Score and Plan: Treatment may vary due to age or medical condition and Propofol infusion  Airway Management Planned: Natural Airway and Nasal Cannula  Additional Equipment:   Intra-op Plan:   Post-operative Plan:   Informed Consent: I have reviewed the patients History and Physical, chart, labs and discussed the procedure including the risks, benefits and alternatives for the proposed anesthesia with the patient or authorized representative who has indicated his/her understanding and acceptance.       Plan Discussed  with: CRNA  Anesthesia Plan Comments:        Anesthesia Quick Evaluation

## 2021-03-19 ENCOUNTER — Other Ambulatory Visit (HOSPITAL_COMMUNITY): Payer: Self-pay

## 2021-03-19 ENCOUNTER — Inpatient Hospital Stay (HOSPITAL_COMMUNITY): Payer: No Typology Code available for payment source | Admitting: Anesthesiology

## 2021-03-19 ENCOUNTER — Encounter (HOSPITAL_COMMUNITY): Payer: Self-pay | Admitting: General Surgery

## 2021-03-19 ENCOUNTER — Encounter (HOSPITAL_COMMUNITY)
Admission: AD | Disposition: A | Discharge status: Home Health | Payer: Self-pay | Source: Home / Self Care | Attending: General Surgery

## 2021-03-19 HISTORY — PX: ESOPHAGOGASTRODUODENOSCOPY: SHX5428

## 2021-03-19 LAB — CBC
HCT: 41.7 % (ref 39.0–52.0)
Hemoglobin: 13.6 g/dL (ref 13.0–17.0)
MCH: 29 pg (ref 26.0–34.0)
MCHC: 32.6 g/dL (ref 30.0–36.0)
MCV: 88.9 fL (ref 80.0–100.0)
Platelets: 133 10*3/uL — ABNORMAL LOW (ref 150–400)
RBC: 4.69 MIL/uL (ref 4.22–5.81)
RDW: 14.7 % (ref 11.5–15.5)
WBC: 4.5 10*3/uL (ref 4.0–10.5)
nRBC: 0 % (ref 0.0–0.2)

## 2021-03-19 LAB — COMPREHENSIVE METABOLIC PANEL
ALT: 24 U/L (ref 0–44)
AST: 20 U/L (ref 15–41)
Albumin: 3 g/dL — ABNORMAL LOW (ref 3.5–5.0)
Alkaline Phosphatase: 50 U/L (ref 38–126)
Anion gap: 9 (ref 5–15)
BUN: <5 mg/dL — ABNORMAL LOW (ref 6–20)
CO2: 27 mmol/L (ref 22–32)
Calcium: 8.5 mg/dL — ABNORMAL LOW (ref 8.9–10.3)
Chloride: 103 mmol/L (ref 98–111)
Creatinine, Ser: 0.7 mg/dL (ref 0.61–1.24)
GFR, Estimated: >60 mL/min (ref >60)
Glucose, Bld: 141 mg/dL — ABNORMAL HIGH (ref 70–99)
Potassium: 3.1 mmol/L — ABNORMAL LOW (ref 3.5–5.1)
Sodium: 139 mmol/L (ref 135–145)
Total Bilirubin: 0.8 mg/dL (ref 0.3–1.2)
Total Protein: 6 g/dL — ABNORMAL LOW (ref 6.5–8.1)

## 2021-03-19 LAB — GLUCOSE, CAPILLARY
Glucose-Capillary: 139 mg/dL — ABNORMAL HIGH (ref 70–99)
Glucose-Capillary: 147 mg/dL — ABNORMAL HIGH (ref 70–99)
Glucose-Capillary: 150 mg/dL — ABNORMAL HIGH (ref 70–99)
Glucose-Capillary: 159 mg/dL — ABNORMAL HIGH (ref 70–99)
Glucose-Capillary: 175 mg/dL — ABNORMAL HIGH (ref 70–99)

## 2021-03-19 SURGERY — EGD (ESOPHAGOGASTRODUODENOSCOPY)
Anesthesia: Monitor Anesthesia Care

## 2021-03-19 MED ORDER — PROPOFOL 500 MG/50ML IV EMUL
INTRAVENOUS | Status: DC | PRN
  Administered 2021-03-19: 100 ug/kg/min via INTRAVENOUS

## 2021-03-19 MED ORDER — LACTATED RINGERS IV SOLN: INTRAVENOUS | Status: DC

## 2021-03-19 MED ORDER — PROPOFOL 10 MG/ML IV BOLUS
INTRAVENOUS | Status: DC | PRN
  Administered 2021-03-19: 20 mg via INTRAVENOUS
  Administered 2021-03-19: 50 mg via INTRAVENOUS
  Administered 2021-03-19 (×3): 30 mg via INTRAVENOUS

## 2021-03-19 MED ORDER — KCL IN DEXTROSE-NACL 20-5-0.9 MEQ/L-%-% IV SOLN
INTRAVENOUS | Status: DC
  Filled 2021-03-19 (×8): qty 1000

## 2021-03-19 MED ORDER — LIDOCAINE 2% (20 MG/ML) 5 ML SYRINGE
INTRAMUSCULAR | Status: DC | PRN
  Administered 2021-03-19: 100 mg via INTRAVENOUS

## 2021-03-19 MED ORDER — POTASSIUM CHLORIDE 10 MEQ/100ML IV SOLN
10.0000 meq | INTRAVENOUS | Status: AC
  Administered 2021-03-19 (×4): 10 meq via INTRAVENOUS
  Filled 2021-03-19 (×4): qty 100

## 2021-03-19 NOTE — Anesthesia Procedure Notes (Signed)
Procedure Name: MAC Date/Time: 03/19/2021 7:32 AM Performed by: Deliah Boston, CRNA Pre-anesthesia Checklist: Patient identified, Emergency Drugs available, Suction available and Patient being monitored Patient Re-evaluated:Patient Re-evaluated prior to induction Oxygen Delivery Method: Simple face mask Preoxygenation: Pre-oxygenation with 100% oxygen Induction Type: IV induction Placement Confirmation: positive ETCO2 and breath sounds checked- equal and bilateral

## 2021-03-19 NOTE — Op Note (Signed)
Preoperative diagnosis: dysphagia  Postoperative diagnosis: gastric stricture  Procedure: upper endoscopy  Surgeon: Feliciana Rossetti, M.D.  Asst: none  Anesthesia: general  Indications for procedure: Hector Curry is a 60 y.o. year old male with symptoms of vomiting and progressive intolerance to food.  Description of procedure: The patient was brought into the operative suite. Anesthesia was administered with propofol sedation. WHO checklist was applied. The patient was then placed in decubitus position. The area was prepped and draped in the usual sterile fashion.  Endoscopy was advanced through the oral pharynx and esophagus without difficulty. THe GE junction was identified there was moderate granulation tissue or redundant tissue in the area. Multiple attempts were made to pass the area without success. A peds endoscope was attempted to pass through without success. The procedure was terminated at that time  Findings: narrowing at GE junction  Specimen: none  Implant: none   Blood loss: minimal  Local anesthesia: none  Complications: none  Feliciana Rossetti, M.D. General, Bariatric, & Minimally Invasive Surgery [facility]

## 2021-03-19 NOTE — Progress Notes (Signed)
F/u with patient and family this evening.  Discussed upcoming procedure tomorrow.  Addressed questions and concerns.  Will continue to monitor and f/u as needed.

## 2021-03-19 NOTE — Progress Notes (Signed)
Pre Procedure note for inpatients:   Hector Curry has been scheduled for Procedure(s): ESOPHAGOGASTRODUODENOSCOPY (EGD) (N/A) today. The various methods of treatment have been discussed with the patient. After consideration of the risks, benefits and treatment options the patient has consented to the planned procedure.   The patient has been seen and labs reviewed. There are no changes in the patient's condition to prevent proceeding with the planned procedure today.  Recent labs:  Lab Results  Component Value Date   WBC 4.5 03/19/2021   HGB 13.6 03/19/2021   HCT 41.7 03/19/2021   PLT 133 (L) 03/19/2021   GLUCOSE 141 (H) 03/19/2021   CHOL 151 03/06/2021   TRIG 151 (H) 03/06/2021   HDL 20 (L) 03/06/2021   LDLCALC 101 (H) 03/06/2021   ALT 24 03/19/2021   AST 20 03/19/2021   NA 139 03/19/2021   K 3.1 (L) 03/19/2021   CL 103 03/19/2021   CREATININE 0.70 03/19/2021   BUN <5 (L) 03/19/2021   CO2 27 03/19/2021   TSH 1.018 05/22/2020   INR 1.13 05/08/2015   HGBA1C 7.3 (H) 03/06/2021    Rodman Pickle, MD 03/19/2021 7:21 AM

## 2021-03-19 NOTE — Anesthesia Postprocedure Evaluation (Signed)
Anesthesia Post Note  Patient: Hector Curry  Procedure(s) Performed: ESOPHAGOGASTRODUODENOSCOPY (EGD)     Patient location during evaluation: PACU Anesthesia Type: MAC Level of consciousness: awake and alert and oriented Pain management: pain level controlled Vital Signs Assessment: post-procedure vital signs reviewed and stable Respiratory status: spontaneous breathing, nonlabored ventilation and respiratory function stable Cardiovascular status: blood pressure returned to baseline Postop Assessment: no apparent nausea or vomiting Anesthetic complications: no   No notable events documented.  Last Vitals:  Vitals:   03/19/21 0810 03/19/21 0820  BP: (!) 148/105 (!) 153/90  Pulse: 78 81  Resp: 11 12  Temp:    SpO2: 97% 94%    Last Pain:  Vitals:   03/19/21 0820  TempSrc:   PainSc: 0-No pain                 Brennan Bailey

## 2021-03-19 NOTE — Transfer of Care (Signed)
Immediate Anesthesia Transfer of Care Note  Patient: LAWSON MAHONE  Procedure(s) Performed: Procedure(s): ESOPHAGOGASTRODUODENOSCOPY (EGD) (N/A)  Patient Location: PACU  Anesthesia Type:MAC  Level of Consciousness: Patient easily awoken, sedated, comfortable, cooperative, following commands, responds to stimulation.   Airway & Oxygen Therapy: Patient spontaneously breathing, ventilating well, oxygen via simple oxygen mask.  Post-op Assessment: Report given to PACU RN, vital signs reviewed and stable, moving all extremities.   Post vital signs: Reviewed and stable.  Complications: No apparent anesthesia complications Last Vitals:  Vitals Value Taken Time  BP    Temp    Pulse    Resp    SpO2      Last Pain:  Vitals:   03/19/21 0710  TempSrc: Oral  PainSc: 0-No pain         Complications: No notable events documented.

## 2021-03-20 ENCOUNTER — Encounter (HOSPITAL_COMMUNITY)
Admission: AD | Disposition: A | Discharge status: Home Health | Payer: Self-pay | Source: Home / Self Care | Attending: General Surgery

## 2021-03-20 ENCOUNTER — Encounter (HOSPITAL_COMMUNITY): Payer: Self-pay | Admitting: General Surgery

## 2021-03-20 ENCOUNTER — Inpatient Hospital Stay (HOSPITAL_COMMUNITY): Payer: No Typology Code available for payment source | Admitting: Anesthesiology

## 2021-03-20 HISTORY — PX: UPPER GI ENDOSCOPY: SHX6162

## 2021-03-20 HISTORY — PX: LAPAROSCOPIC INSERTION GASTROSTOMY TUBE: SHX6817

## 2021-03-20 LAB — COMPREHENSIVE METABOLIC PANEL
ALT: 20 U/L (ref 0–44)
AST: 15 U/L (ref 15–41)
Albumin: 2.9 g/dL — ABNORMAL LOW (ref 3.5–5.0)
Alkaline Phosphatase: 50 U/L (ref 38–126)
Anion gap: 9 (ref 5–15)
BUN: <5 mg/dL — ABNORMAL LOW (ref 6–20)
CO2: 26 mmol/L (ref 22–32)
Calcium: 8.6 mg/dL — ABNORMAL LOW (ref 8.9–10.3)
Chloride: 106 mmol/L (ref 98–111)
Creatinine, Ser: 0.65 mg/dL (ref 0.61–1.24)
GFR, Estimated: >60 mL/min (ref >60)
Glucose, Bld: 153 mg/dL — ABNORMAL HIGH (ref 70–99)
Potassium: 3.3 mmol/L — ABNORMAL LOW (ref 3.5–5.1)
Sodium: 141 mmol/L (ref 135–145)
Total Bilirubin: 0.7 mg/dL (ref 0.3–1.2)
Total Protein: 5.7 g/dL — ABNORMAL LOW (ref 6.5–8.1)

## 2021-03-20 LAB — GLUCOSE, CAPILLARY
Glucose-Capillary: 139 mg/dL — ABNORMAL HIGH (ref 70–99)
Glucose-Capillary: 143 mg/dL — ABNORMAL HIGH (ref 70–99)
Glucose-Capillary: 147 mg/dL — ABNORMAL HIGH (ref 70–99)
Glucose-Capillary: 151 mg/dL — ABNORMAL HIGH (ref 70–99)
Glucose-Capillary: 182 mg/dL — ABNORMAL HIGH (ref 70–99)
Glucose-Capillary: 191 mg/dL — ABNORMAL HIGH (ref 70–99)
Glucose-Capillary: 256 mg/dL — ABNORMAL HIGH (ref 70–99)
Glucose-Capillary: 272 mg/dL — ABNORMAL HIGH (ref 70–99)

## 2021-03-20 LAB — CBC
HCT: 40.6 % (ref 39.0–52.0)
Hemoglobin: 12.8 g/dL — ABNORMAL LOW (ref 13.0–17.0)
MCH: 28.6 pg (ref 26.0–34.0)
MCHC: 31.5 g/dL (ref 30.0–36.0)
MCV: 90.6 fL (ref 80.0–100.0)
Platelets: 119 10*3/uL — ABNORMAL LOW (ref 150–400)
RBC: 4.48 MIL/uL (ref 4.22–5.81)
RDW: 14.9 % (ref 11.5–15.5)
WBC: 8.3 10*3/uL (ref 4.0–10.5)
nRBC: 0 % (ref 0.0–0.2)

## 2021-03-20 LAB — SARS CORONAVIRUS 2 BY RT PCR (HOSPITAL ORDER, PERFORMED IN ~~LOC~~ HOSPITAL LAB): SARS Coronavirus 2: NEGATIVE

## 2021-03-20 SURGERY — INSERTION, GASTROSTOMY TUBE, PERCUTANEOUS
Anesthesia: General | Site: Esophagus

## 2021-03-20 MED ORDER — ROCURONIUM BROMIDE 10 MG/ML (PF) SYRINGE
PREFILLED_SYRINGE | INTRAVENOUS | Status: DC | PRN
  Administered 2021-03-20: 45 mg via INTRAVENOUS
  Administered 2021-03-20: 15 mg via INTRAVENOUS

## 2021-03-20 MED ORDER — VITAL HIGH PROTEIN PO LIQD
1000.0000 mL | ORAL | Status: DC
  Administered 2021-03-20: 1000 mL
  Filled 2021-03-20: qty 1000

## 2021-03-20 MED ORDER — LIDOCAINE 2% (20 MG/ML) 5 ML SYRINGE
INTRAMUSCULAR | Status: AC
  Filled 2021-03-20: qty 5

## 2021-03-20 MED ORDER — ONDANSETRON HCL 4 MG/2ML IJ SOLN: 4.0000 mg | Freq: Once | INTRAMUSCULAR | Status: DC | PRN

## 2021-03-20 MED ORDER — MIDAZOLAM HCL 2 MG/2ML IJ SOLN
INTRAMUSCULAR | Status: DC | PRN
  Administered 2021-03-20: 2 mg via INTRAVENOUS

## 2021-03-20 MED ORDER — PROPOFOL 10 MG/ML IV BOLUS
INTRAVENOUS | Status: AC
  Filled 2021-03-20: qty 20

## 2021-03-20 MED ORDER — FENTANYL CITRATE (PF) 250 MCG/5ML IJ SOLN
INTRAMUSCULAR | Status: DC | PRN
  Administered 2021-03-20: 50 ug via INTRAVENOUS
  Administered 2021-03-20: 150 ug via INTRAVENOUS
  Administered 2021-03-20: 50 ug via INTRAVENOUS

## 2021-03-20 MED ORDER — 0.9 % SODIUM CHLORIDE (POUR BTL) OPTIME
TOPICAL | Status: DC | PRN
  Administered 2021-03-20: 1000 mL

## 2021-03-20 MED ORDER — ONDANSETRON HCL 4 MG/2ML IJ SOLN
INTRAMUSCULAR | Status: AC
  Filled 2021-03-20: qty 2

## 2021-03-20 MED ORDER — BUPIVACAINE HCL 0.25 % IJ SOLN
INTRAMUSCULAR | Status: AC
  Filled 2021-03-20: qty 1

## 2021-03-20 MED ORDER — BUPIVACAINE HCL (PF) 0.25 % IJ SOLN
INTRAMUSCULAR | Status: DC | PRN
  Administered 2021-03-20: 30 mL

## 2021-03-20 MED ORDER — ROCURONIUM BROMIDE 10 MG/ML (PF) SYRINGE: PREFILLED_SYRINGE | INTRAVENOUS | Status: DC | PRN

## 2021-03-20 MED ORDER — LIDOCAINE 2% (20 MG/ML) 5 ML SYRINGE
INTRAMUSCULAR | Status: DC | PRN
  Administered 2021-03-20: 1.5 mg/kg/h via INTRAVENOUS

## 2021-03-20 MED ORDER — KETAMINE HCL 10 MG/ML IJ SOLN
INTRAMUSCULAR | Status: DC | PRN
  Administered 2021-03-20: 20 mg via INTRAVENOUS
  Administered 2021-03-20: 30 mg via INTRAVENOUS

## 2021-03-20 MED ORDER — BUPIVACAINE LIPOSOME 1.3 % IJ SUSP
20.0000 mL | Freq: Once | INTRAMUSCULAR | Status: AC
  Administered 2021-03-20: 20 mL
  Filled 2021-03-20: qty 20

## 2021-03-20 MED ORDER — SUCCINYLCHOLINE CHLORIDE 200 MG/10ML IV SOSY
PREFILLED_SYRINGE | INTRAVENOUS | Status: DC | PRN
  Administered 2021-03-20: 1160 mg via INTRAVENOUS

## 2021-03-20 MED ORDER — CEFAZOLIN IN SODIUM CHLORIDE 3-0.9 GM/100ML-% IV SOLN
3.0000 g | INTRAVENOUS | Status: DC
  Filled 2021-03-20: qty 100

## 2021-03-20 MED ORDER — SUGAMMADEX SODIUM 500 MG/5ML IV SOLN
INTRAVENOUS | Status: AC
  Filled 2021-03-20: qty 5

## 2021-03-20 MED ORDER — LIDOCAINE 2% (20 MG/ML) 5 ML SYRINGE
INTRAMUSCULAR | Status: DC | PRN
  Administered 2021-03-20: 80 mg via INTRAVENOUS

## 2021-03-20 MED ORDER — SUGAMMADEX SODIUM 500 MG/5ML IV SOLN
INTRAVENOUS | Status: DC | PRN
  Administered 2021-03-20: 300 mg via INTRAVENOUS

## 2021-03-20 MED ORDER — MIDAZOLAM HCL 2 MG/2ML IJ SOLN
INTRAMUSCULAR | Status: AC
  Filled 2021-03-20: qty 2

## 2021-03-20 MED ORDER — ONDANSETRON HCL 4 MG/2ML IJ SOLN
INTRAMUSCULAR | Status: DC | PRN
  Administered 2021-03-20: 4 mg via INTRAVENOUS

## 2021-03-20 MED ORDER — FENTANYL CITRATE (PF) 100 MCG/2ML IJ SOLN: 25.0000 ug | INTRAMUSCULAR | Status: DC | PRN

## 2021-03-20 MED ORDER — DEXTROSE 5 % IV SOLN
3.0000 g | Freq: Once | INTRAVENOUS | Status: DC
  Administered 2021-03-20: 3 g via INTRAVENOUS

## 2021-03-20 MED ORDER — DEXAMETHASONE SODIUM PHOSPHATE 10 MG/ML IJ SOLN
INTRAMUSCULAR | Status: DC | PRN
Start: 2021-03-20 — End: 2021-03-20
  Administered 2021-03-20: 10 mg via INTRAVENOUS

## 2021-03-20 MED ORDER — LACTATED RINGERS IR SOLN
Status: DC | PRN
  Administered 2021-03-20: 1000 mL

## 2021-03-20 MED ORDER — ROCURONIUM BROMIDE 10 MG/ML (PF) SYRINGE
PREFILLED_SYRINGE | INTRAVENOUS | Status: AC
  Filled 2021-03-20: qty 10

## 2021-03-20 MED ORDER — EPHEDRINE 5 MG/ML INJ
INTRAVENOUS | Status: AC
  Filled 2021-03-20: qty 5

## 2021-03-20 MED ORDER — FENTANYL CITRATE (PF) 250 MCG/5ML IJ SOLN
INTRAMUSCULAR | Status: AC
  Filled 2021-03-20: qty 5

## 2021-03-20 MED ORDER — POTASSIUM CHLORIDE 10 MEQ/100ML IV SOLN
10.0000 meq | INTRAVENOUS | Status: AC
  Administered 2021-03-20: 10 meq via INTRAVENOUS
  Filled 2021-03-20: qty 100

## 2021-03-20 MED ORDER — LIDOCAINE 2% (20 MG/ML) 5 ML SYRINGE
INTRAMUSCULAR | Status: AC
  Filled 2021-03-20: qty 15

## 2021-03-20 MED ORDER — OXYCODONE HCL 5 MG/5ML PO SOLN
5.0000 mg | Freq: Four times a day (QID) | ORAL | Status: DC | PRN
Start: 2021-03-20 — End: 2021-03-25
  Administered 2021-03-20: 5 mg
  Filled 2021-03-20: qty 5

## 2021-03-20 MED ORDER — DEXAMETHASONE SODIUM PHOSPHATE 10 MG/ML IJ SOLN
INTRAMUSCULAR | Status: AC
  Filled 2021-03-20: qty 1

## 2021-03-20 MED ORDER — LABETALOL HCL 5 MG/ML IV SOLN
INTRAVENOUS | Status: DC | PRN
  Administered 2021-03-20: 5 mg via INTRAVENOUS

## 2021-03-20 MED ORDER — PROPOFOL 10 MG/ML IV BOLUS
INTRAVENOUS | Status: DC | PRN
  Administered 2021-03-20: 200 mg via INTRAVENOUS

## 2021-03-20 MED ORDER — SUCCINYLCHOLINE CHLORIDE 200 MG/10ML IV SOSY
PREFILLED_SYRINGE | INTRAVENOUS | Status: AC
  Filled 2021-03-20: qty 10

## 2021-03-20 MED ORDER — KETAMINE HCL 10 MG/ML IJ SOLN
INTRAMUSCULAR | Status: AC
  Filled 2021-03-20: qty 1

## 2021-03-20 MED ORDER — LACTATED RINGERS IV SOLN: INTRAVENOUS | Status: DC | PRN

## 2021-03-20 MED ORDER — LABETALOL HCL 5 MG/ML IV SOLN
INTRAVENOUS | Status: AC
  Filled 2021-03-20: qty 4

## 2021-03-20 MED ORDER — EPHEDRINE SULFATE-NACL 50-0.9 MG/10ML-% IV SOSY
PREFILLED_SYRINGE | INTRAVENOUS | Status: DC | PRN
  Administered 2021-03-20 (×2): 10 mg via INTRAVENOUS

## 2021-03-20 SURGICAL SUPPLY — 43 items
ADH SKN CLS APL DERMABOND .7 (GAUZE/BANDAGES/DRESSINGS)
APL PRP STRL LF DISP 70% ISPRP (MISCELLANEOUS) ×2
APL SKNCLS STERI-STRIP NONHPOA (GAUZE/BANDAGES/DRESSINGS)
BAG COUNTER SPONGE SURGICOUNT (BAG) IMPLANT
BAG SPNG CNTER NS LX DISP (BAG)
BENZOIN TINCTURE PRP APPL 2/3 (GAUZE/BANDAGES/DRESSINGS) IMPLANT
BNDG ADH 1X3 SHEER STRL LF (GAUZE/BANDAGES/DRESSINGS) ×4 IMPLANT
BNDG ADH THN 3X1 STRL LF (GAUZE/BANDAGES/DRESSINGS) ×2
CABLE HIGH FREQUENCY MONO STRZ (ELECTRODE) ×2 IMPLANT
CATH BOLUS GASTRO 22FR (CATHETERS) IMPLANT
CATH GASTROSTOMY 24FR (CATHETERS) IMPLANT
CHLORAPREP W/TINT 26 (MISCELLANEOUS) ×3 IMPLANT
COVER SURGICAL LIGHT HANDLE (MISCELLANEOUS) ×3 IMPLANT
DECANTER SPIKE VIAL GLASS SM (MISCELLANEOUS) ×3 IMPLANT
DERMABOND ADVANCED (GAUZE/BANDAGES/DRESSINGS)
DERMABOND ADVANCED .7 DNX12 (GAUZE/BANDAGES/DRESSINGS) IMPLANT
G-TUBE MIC 24F 7-10 BALLOON (CATHETERS) IMPLANT
GLOVE SURG POLYISO LF SZ7 (GLOVE) ×5 IMPLANT
GLOVE SURG UNDER POLY LF SZ7 (GLOVE) ×3 IMPLANT
GOWN STRL REUS W/TWL LRG LVL3 (GOWN DISPOSABLE) ×3 IMPLANT
GOWN STRL REUS W/TWL XL LVL3 (GOWN DISPOSABLE) ×6 IMPLANT
HOVERMATT SINGLE USE (MISCELLANEOUS) ×2 IMPLANT
KIT BASIN OR (CUSTOM PROCEDURE TRAY) ×3 IMPLANT
KIT TURNOVER KIT A (KITS) ×3 IMPLANT
LUBRICANT JELLY K Y 4OZ (MISCELLANEOUS) IMPLANT
PENCIL SMOKE EVACUATOR (MISCELLANEOUS) IMPLANT
SCISSORS LAP 5X35 DISP (ENDOMECHANICALS) ×1 IMPLANT
SET IRRIG TUBING LAPAROSCOPIC (IRRIGATION / IRRIGATOR) IMPLANT
SET IRRIG Y TYPE TUR BLADDER L (SET/KITS/TRAYS/PACK) IMPLANT
SET TUBE SMOKE EVAC HIGH FLOW (TUBING) ×3 IMPLANT
SHEARS HARMONIC ACE PLUS 45CM (MISCELLANEOUS) ×1 IMPLANT
SLEEVE XCEL OPT CAN 5 100 (ENDOMECHANICALS) ×5 IMPLANT
SPONGE DRAIN TRACH 4X4 STRL 2S (GAUZE/BANDAGES/DRESSINGS) ×1 IMPLANT
STRIP CLOSURE SKIN 1/2X4 (GAUZE/BANDAGES/DRESSINGS) ×1 IMPLANT
SUT ETHILON 2 0 PS N (SUTURE) IMPLANT
SUT MNCRL AB 4-0 PS2 18 (SUTURE) ×3 IMPLANT
SUT SILK 0 SH 30 (SUTURE) ×8 IMPLANT
TOWEL OR 17X26 10 PK STRL BLUE (TOWEL DISPOSABLE) ×3 IMPLANT
TOWEL OR NON WOVEN STRL DISP B (DISPOSABLE) IMPLANT
TRAY LAPAROSCOPIC (CUSTOM PROCEDURE TRAY) ×3 IMPLANT
TROCAR BLADELESS OPT 5 100 (ENDOMECHANICALS) ×3 IMPLANT
TROCAR XCEL NON-BLD 11X100MML (ENDOMECHANICALS) ×2 IMPLANT
TUBE GASTROSTOMY 18F (CATHETERS) ×2 IMPLANT

## 2021-03-20 NOTE — Transfer of Care (Signed)
Immediate Anesthesia Transfer of Care Note  Patient: Hector Curry  Procedure(s) Performed: LAPAROSCOPIC INSERTION GASTROSTOMY TUBE (Abdomen) UPPER GI ENDOSCOPY (Esophagus)  Patient Location: PACU  Anesthesia Type:General  Level of Consciousness: awake, alert  and oriented  Airway & Oxygen Therapy: Patient Spontanous Breathing and Patient connected to face mask oxygen  Post-op Assessment: Report given to RN and Post -op Vital signs reviewed and stable  Post vital signs: Reviewed and stable  Last Vitals:  Vitals Value Taken Time  BP 165/97 03/20/21 1415  Temp 36.6 C 03/20/21 1415  Pulse 91 03/20/21 1418  Resp 18 03/20/21 1418  SpO2 100 % 03/20/21 1418  Vitals shown include unvalidated device data.  Last Pain:  Vitals:   03/20/21 1121  TempSrc:   PainSc: 0-No pain         Complications: No notable events documented.

## 2021-03-20 NOTE — Progress Notes (Signed)
Pre Procedure note for inpatients:   JOANDY BURGET has been scheduled for Procedure(s): LAPAROSCOPIC INSERTION GASTROSTOMY TUBE (N/A) UPPER GI ENDOSCOPY (N/A) today. The various methods of treatment have been discussed with the patient. After consideration of the risks, benefits and treatment options the patient has consented to the planned procedure.   The patient has been seen and labs reviewed. There are no changes in the patient's condition to prevent proceeding with the planned procedure today.  Recent labs:  Lab Results  Component Value Date   WBC 8.3 03/20/2021   HGB 12.8 (L) 03/20/2021   HCT 40.6 03/20/2021   PLT 119 (L) 03/20/2021   GLUCOSE 153 (H) 03/20/2021   CHOL 151 03/06/2021   TRIG 151 (H) 03/06/2021   HDL 20 (L) 03/06/2021   LDLCALC 101 (H) 03/06/2021   ALT 20 03/20/2021   AST 15 03/20/2021   NA 141 03/20/2021   K 3.3 (L) 03/20/2021   CL 106 03/20/2021   CREATININE 0.65 03/20/2021   BUN <5 (L) 03/20/2021   CO2 26 03/20/2021   TSH 1.018 05/22/2020   INR 1.13 05/08/2015   HGBA1C 7.3 (H) 03/06/2021    Rodman Pickle, MD 03/20/2021 12:15 PM

## 2021-03-20 NOTE — Anesthesia Preprocedure Evaluation (Signed)
Anesthesia Evaluation  Patient identified by MRN, date of birth, ID band Patient awake    Reviewed: Allergy & Precautions, NPO status , Patient's Chart, lab work & pertinent test results, reviewed documented beta blocker date and time   History of Anesthesia Complications Negative for: history of anesthetic complications  Airway Mallampati: III  TM Distance: >3 FB Neck ROM: Full    Dental  (+) Missing,    Pulmonary sleep apnea and Continuous Positive Airway Pressure Ventilation , former smoker,    Pulmonary exam normal        Cardiovascular hypertension, Pt. on medications and Pt. 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  Gastroesophageal stricture post lap gastric band   Endo/Other  diabetes, Poorly Controlled, Type 2Hx/o morbid obesity  Renal/GU Renal diseaseHx/o renal calculi  negative genitourinary   Musculoskeletal  (+) Arthritis ,   Abdominal   Peds  Hematology negative hematology ROS (+)   Anesthesia Other Findings   Reproductive/Obstetrics negative OB ROS                             Anesthesia Physical  Anesthesia Plan  ASA: 3  Anesthesia Plan: General   Post-op Pain Management:    Induction: Intravenous and Cricoid pressure planned  PONV Risk Score and Plan: 4 or greater and Treatment may vary due to age or medical condition and Ondansetron  Airway Management Planned: Oral ETT  Additional Equipment:   Intra-op Plan:   Post-operative Plan: Extubation in OR  Informed Consent: I have reviewed the  patients History and Physical, chart, labs and discussed the procedure including the risks, benefits and alternatives for the proposed anesthesia with the patient or authorized representative who has indicated his/her understanding and acceptance.     Dental advisory given  Plan Discussed with: CRNA and Anesthesiologist  Anesthesia Plan Comments:         Anesthesia Quick Evaluation

## 2021-03-20 NOTE — Anesthesia Postprocedure Evaluation (Signed)
Anesthesia Post Note  Patient: Hector Curry  Procedure(s) Performed: LAPAROSCOPIC INSERTION GASTROSTOMY TUBE (Abdomen) UPPER GI ENDOSCOPY (Esophagus)     Patient location during evaluation: PACU Anesthesia Type: General Level of consciousness: awake and alert Pain management: pain level controlled Vital Signs Assessment: post-procedure vital signs reviewed and stable Respiratory status: spontaneous breathing, nonlabored ventilation, respiratory function stable and patient connected to nasal cannula oxygen Cardiovascular status: blood pressure returned to baseline and stable Postop Assessment: no apparent nausea or vomiting Anesthetic complications: no   No notable events documented.  Last Vitals:  Vitals:   03/20/21 1445 03/20/21 1500  BP: (!) 148/96 (!) 158/87  Pulse: 89 93  Resp: 17 18  Temp:  36.8 C  SpO2: 95% 94%    Last Pain:  Vitals:   03/20/21 1500  TempSrc:   PainSc: 0-No pain                 Reagen Haberman

## 2021-03-20 NOTE — Anesthesia Procedure Notes (Signed)
Procedure Name: Intubation Date/Time: 03/20/2021 12:32 PM Performed by: Florene Route, CRNA Pre-anesthesia Checklist: Suction available, Patient being monitored and Emergency Drugs available Patient Re-evaluated:Patient Re-evaluated prior to induction Oxygen Delivery Method: Circle system utilized Preoxygenation: Pre-oxygenation with 100% oxygen Induction Type: IV induction Ventilation: Mask ventilation without difficulty Laryngoscope Size: Glidescope and 3 Grade View: Grade I Tube type: Parker flex tip Tube size: 7.5 mm Number of attempts: 1 Airway Equipment and Method: Rigid stylet and Video-laryngoscopy Placement Confirmation: ETT inserted through vocal cords under direct vision, positive ETCO2 and breath sounds checked- equal and bilateral Secured at: 22 cm Tube secured with: Tape Dental Injury: Teeth and Oropharynx as per pre-operative assessment

## 2021-03-20 NOTE — Op Note (Signed)
Preoperative diagnosis: dysphagia after gastric bypass  Postoperative diagnosis: same   Procedure: laparoscopic gastrostomy tube insertion, upper endoscopy  Surgeon: Feliciana Rossetti, M.D.  Asst: Ivar Drape  Anesthesia: general  Indications for procedure: Hector Curry is a 60 y.o. year old male with symptoms of nausea, vomiting, inability to tolerate solid foods.  Description of procedure: The patient was brought into the operative suite. Anesthesia was administered with General endotracheal anesthesia. WHO checklist was applied. The patient was then placed in supine position. The area was prepped and draped in the usual sterile fashion.  Next, a transverse incision was in the left subcostal area. A 28mm trocar was used to gain access to the peritoneal cavity by optical entry technique. Pneumoperitoneum was applied with high flow and low pressure and laparoscope was reinserted to confirm placement. 1 5 mm trocar was placed in the right upper abdomen, 1 12 mm trocar was placed in the right mid abdomen, 1 5 mm trocar was placed in the upper mid abdomen and 1 5 mm trocar was placed in the left lateral abdomen. Bilateral TAP blocks were placed with Marcaine/Exparel mix.  On initial evaluation of the abdomen, There were adhesions to the left upper quadrant to the omentum. These were taken down with the harmonic scalpel and blunt dissection. The roux limb was identified. There were adhesions of the liver to the stomach and this was left alone to avoid injury. Next, the gastric remnant was identified. Omentum was divided to allow the stomach to reach the abdominal wall.   At this time, I performed an upper endoscopy to try to traverse the narrowing under general anesthesia. This was unsuccessful. There was air seen in the Roux during this procedure.  A purse string was made with 2-0 silk. A gastrotomy was made in the center of the purse string. An incision was made in the left subcostal area.  A 18 Fr g tube was inserted through this hole and into the gastrotomy. The balloon was filled with 10 ml of saline. The purse string was tied down. Next, 4 0 silks were used to suture the stomach to the abdominal wall using a transfacial suture passer to bring them through the abdominal wall.All trocars sites were closed with 4-0 monocryl subcuticular sutures. The patient tolerated the procedure well. All counts were correct.  Findings: g tube in remnant stomach with remnant stomach apposed to the left abdominal wall  Specimen: none  Implant: 18 fr g tube   Blood loss: 20 ml  Local anesthesia:  50 ml Exparel:Marcaine Mix  Complications: none  Feliciana Rossetti, M.D. General, Bariatric, & Minimally Invasive Surgery [facility] [place]

## 2021-03-21 ENCOUNTER — Encounter (HOSPITAL_COMMUNITY): Payer: Self-pay | Admitting: General Surgery

## 2021-03-21 LAB — COMPREHENSIVE METABOLIC PANEL
ALT: 17 U/L (ref 0–44)
AST: 13 U/L — ABNORMAL LOW (ref 15–41)
Albumin: 3.1 g/dL — ABNORMAL LOW (ref 3.5–5.0)
Alkaline Phosphatase: 51 U/L (ref 38–126)
Anion gap: 4 — ABNORMAL LOW (ref 5–15)
BUN: <5 mg/dL — ABNORMAL LOW (ref 6–20)
CO2: 27 mmol/L (ref 22–32)
Calcium: 8.6 mg/dL — ABNORMAL LOW (ref 8.9–10.3)
Chloride: 107 mmol/L (ref 98–111)
Creatinine, Ser: 0.75 mg/dL (ref 0.61–1.24)
GFR, Estimated: >60 mL/min (ref >60)
Glucose, Bld: 263 mg/dL — ABNORMAL HIGH (ref 70–99)
Potassium: 3.9 mmol/L (ref 3.5–5.1)
Sodium: 138 mmol/L (ref 135–145)
Total Bilirubin: 0.7 mg/dL (ref 0.3–1.2)
Total Protein: 6.3 g/dL — ABNORMAL LOW (ref 6.5–8.1)

## 2021-03-21 LAB — PHOSPHORUS: Phosphorus: 1.6 mg/dL — ABNORMAL LOW (ref 2.5–4.6)

## 2021-03-21 LAB — GLUCOSE, CAPILLARY
Glucose-Capillary: 234 mg/dL — ABNORMAL HIGH (ref 70–99)
Glucose-Capillary: 213 mg/dL — ABNORMAL HIGH (ref 70–99)
Glucose-Capillary: 179 mg/dL — ABNORMAL HIGH (ref 70–99)
Glucose-Capillary: 190 mg/dL — ABNORMAL HIGH (ref 70–99)
Glucose-Capillary: 155 mg/dL — ABNORMAL HIGH (ref 70–99)

## 2021-03-21 LAB — CBC
HCT: 41.7 % (ref 39.0–52.0)
Hemoglobin: 13.3 g/dL (ref 13.0–17.0)
MCH: 28.5 pg (ref 26.0–34.0)
MCHC: 31.9 g/dL (ref 30.0–36.0)
MCV: 89.5 fL (ref 80.0–100.0)
Platelets: 143 10*3/uL — ABNORMAL LOW (ref 150–400)
RBC: 4.66 MIL/uL (ref 4.22–5.81)
RDW: 15.1 % (ref 11.5–15.5)
WBC: 8.5 10*3/uL (ref 4.0–10.5)
nRBC: 0 % (ref 0.0–0.2)

## 2021-03-21 LAB — MAGNESIUM: Magnesium: 1.5 mg/dL — ABNORMAL LOW (ref 1.7–2.4)

## 2021-03-21 MED ORDER — MAGNESIUM SULFATE 2 GM/50ML IV SOLN: 2.0000 g | Freq: Once | INTRAVENOUS | Status: AC

## 2021-03-21 MED ORDER — INSULIN ASPART 100 UNIT/ML IJ SOLN
3.0000 [IU] | INTRAMUSCULAR | Status: DC
  Administered 2021-03-21: 3 [IU] via SUBCUTANEOUS

## 2021-03-21 MED ORDER — INSULIN ASPART 100 UNIT/ML IJ SOLN
3.0000 [IU] | INTRAMUSCULAR | Status: DC
  Administered 2021-03-22 – 2021-03-25 (×20): 3 [IU] via SUBCUTANEOUS

## 2021-03-21 MED ORDER — VITAL HIGH PROTEIN PO LIQD
1000.0000 mL | ORAL | Status: DC
  Administered 2021-03-21: 1000 mL
  Filled 2021-03-21: qty 1000

## 2021-03-21 MED ORDER — UNJURY CHICKEN SOUP POWDER
2.0000 [oz_av] | Freq: Four times a day (QID) | ORAL | Status: DC
  Administered 2021-03-21 (×2): 2 [oz_av] via ORAL

## 2021-03-21 MED ORDER — SODIUM PHOSPHATES 45 MMOLE/15ML IV SOLN
30.0000 mmol | Freq: Once | INTRAVENOUS | Status: AC
  Administered 2021-03-21: 30 mmol via INTRAVENOUS
  Filled 2021-03-21: qty 10

## 2021-03-21 MED ORDER — MAGNESIUM SULFATE 2 GM/50ML IV SOLN
2.0000 g | Freq: Once | INTRAVENOUS | Status: AC
  Administered 2021-03-21: 2 g via INTRAVENOUS
  Filled 2021-03-21: qty 50

## 2021-03-21 NOTE — Progress Notes (Signed)
F/u with patient and family this morning.  Pt in bed, alert, and engaged.  Discussed with pt importance of respiratory hygiene (using IS) and ambulation during recovery.  Will continue to f/u with pt as needed.

## 2021-03-21 NOTE — Progress Notes (Signed)
Initial Nutrition Assessment  DOCUMENTATION CODES:   Non-severe (moderate) malnutrition in context of acute illness/injury, Obesity unspecified  INTERVENTION:   Monitor magnesium, potassium, and phosphorus daily for at least 3 days, MD to replete as needed, as pt is at risk for refeeding syndrome.  -Will continue Vital HP at 20 ml/hr for today -Surgery advancing at midnight to 30 ml/hr. -Trial of Unjury Chicken Soup, 2 oz PO QID, provides 90 kcals and 21g protein  7/29: Switch to Osmolite 1.2 @ 30 ml/hr via G-tube in remnant stomach -Advance by 10 ml every 12 hours to goal of 40 ml/hr. -45 ml Prosource TF TID, each provides 40 kcals and 11g protein -Providing 1272 kcals, 86g protein and 787 ml H2O  Bolus regimen recommendations: -Begin with 50 ml Osmolite 1.2 via G-tube every 2 hours (8 feedings total while awake) -Free water flush with 30 ml before and after each bolus As tolerated work up to 100 ml Osmolite 1.2 7 times daily (~every 2 hours).  -Continue Prosource TF BID. -3 cartons total of Osmolite 1.2 + Prosource TF BID provides 935 kcals (66% of needs), 61g protein (77% of needs) and 1005 ml H2O. -Free water of 240 ml TID via G-tube or PO -Will meet rest of needs with other liquids/diet and protein supplements  -Pt to resume daily bariatric MVI and calcium once tolerating POs.  NUTRITION DIAGNOSIS:   Moderate Malnutrition related to acute illness as evidenced by percent weight loss, energy intake < or equal to 75% for > or equal to 1 month.  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  He is readmitted for the second time with p.o. intolerance.  At last  admission which was only 1 day, he did have a CT scan without significant abnormality.  Upper GI revealed narrowing at the band site with delayed passage of contrast.  He did have some improvement in his symptoms and was discharged home 8 days ago.  The patient endorses ongoing intolerance to liquids.  7/24: admitted 7/27: s/p laparoscopic gastrostomy tube insertion, upper endoscopy  Patient in room with wife at bedside. Pt reports he was able to advance his diet post-op to Phase 3 (soft proteins) following his RNY surgery on 6/27. Pt did well with liquids at that time but once trying solids, he developed some issues with swallowing and solids were not staying down. Reported intakes from outpatient RD on 7/18 revealed pt was taking in 64 oz of fluids but only 40g protein daily. Eventually his symptoms worsened to where he has been unable to keep liquids, vitamins and medications down.   Pt states since starting IVF and tube feeds he has noticed a big difference in his skin saying that PTA his skin was dry and flaky. Pt was dehydrated. Denies any other symptoms that may indicate vitamin deficiency at this time. Pt states it has been about  8 days since his last MVI. He stopped calcium prior to that.   Pt tolerating some water today. He believes the Protonix is helping a lot. He is willing to try 2 oz increments of Unjury Chicken soup for protein.  Pt receiving Vital HP at 20 ml/hr via  G-tube in remnant stomach. Surgery has placed order to have TF advanced to 30 ml/hr at midnight tonight. Slow advancement recommended given pt showing signs of refeeding syndrome. 30 ml/hr provides 720 kcals, 63g protein and 601 ml H2O. Recommend closer to discharge, switching to Osmolite 1.2 which is similar in carbohydrate amounts as Vital HP.   Vital HP @ 20 provides 22g CHO Osm 1.2 @ 20 provides 31g CHO  Will add Prosource TF tomorrow to help meet protein needs further.   Admission weight: 288 lbs. Lowest weight  this admission 278 lbs. Current weight: 285 lbs -most likely r/t fluids Since surgery on 6/27, pt has lost 31 lbs (10% wt loss x 1 month, significant for time frame).  Medications: D5 infusion, IV Mg sulfate, Sodium phosphate  Labs reviewed:  CBGs: 179-272 -trending down Low Mg/Phos  NUTRITION - FOCUSED PHYSICAL EXAM:  Flowsheet Row Most Recent Value  Orbital Region No depletion  Upper Arm Region No depletion  Thoracic and Lumbar Region Unable to assess  Buccal Region Mild depletion  Temple Region No depletion  Clavicle Bone Region No depletion  Clavicle and Acromion Bone Region No depletion  Scapular Bone Region No depletion  Dorsal Hand No depletion  Patellar Region Unable to assess  Anterior Thigh Region Unable to assess  Posterior Calf Region Unable to assess  Edema (RD Assessment) None  Hair Reviewed  Eyes Reviewed  Skin Reviewed       Diet Order:   Diet Order             Diet bariatric clear liquid Room service appropriate? Yes; Fluid consistency: Thin  Diet effective now                   EDUCATION NEEDS:   Education needs have been addressed  Skin:  Skin Assessment: Skin Integrity Issues: Skin Integrity Issues:: Incisions Incisions: 7/27 abdomen  Last BM:  7/27  Height:   Ht Readings from Last 1 Encounters:  03/19/21 6\' 2"  (1.88 m)    Weight:   Wt Readings from Last 1 Encounters:  03/21/21 129.6 kg    BMI:  Body mass index is 36.68 kg/m.  Estimated Nutritional Needs:   Kcal:  1400-1600  Protein:  80-100g  Fluid:  2L/day  03/23/21, MS, RD, LDN Inpatient Clinical Dietitian Contact information available via Amion

## 2021-03-21 NOTE — Progress Notes (Addendum)
1 Day Post-Op    CC: Dysphagia/p.o. intolerance  Subjective: Patient is doing fairly well this AM.  Said the pain is not a big issue.  He is sipping on some ice and water at the bedside.  Tube feeding has been started, port sites all look good.  His biggest concern is his elevated glucose.  Objective: Vital signs in last 24 hours: Temp:  [97.8 F (36.6 C)-99.1 F (37.3 C)] 97.8 F (36.6 C) (07/28 0534) Pulse Rate:  [82-120] 85 (07/28 0534) Resp:  [15-20] 18 (07/28 0534) BP: (125-172)/(74-101) 146/82 (07/28 0534) SpO2:  [92 %-100 %] 96 % (07/28 0534) Weight:  [129.6 kg] 129.6 kg (07/28 0534) Last BM Date: 03/20/21 61 p.o. Tube feeding 82 2600 IV 2300 urine No other output recorded Afebrile, heart rate up to 120 last evening Glucose 263, potassium 3.9, magnesium 1.5 WBC 8.5 H/H 13.3/41.7, platelets 143,000 Glucose 263 Hemoglobin A1c 03/06/2021 -7.3 Upper GI with contrast 7/16: Previous identified Lap-Band was removed.  There was a significant narrowing at the site of the previous Lap-Band on multiple images with swallowing took 90 seconds for the esophagus to completely empty into a new gastric pouch.  The gastric pouch emptied normally into the jejunum.    Intake/Output from previous day: 07/27 0701 - 07/28 0700 In: 2752 [P.O.:60; I.V.:2560; NG/GT:82; IV Piggyback:50] Out: 2325 [Urine:2300; Blood:25] Intake/Output this shift: No intake/output data recorded.  General appearance: alert, cooperative, and no distress Resp: clear to auscultation bilaterally GI: Soft, minimal soreness.  Gastrostomy tube is in place and is 20 mils per hour.  Lab Results:  Recent Labs    03/20/21 0422 03/21/21 0439  WBC 8.3 8.5  HGB 12.8* 13.3  HCT 40.6 41.7  PLT 119* 143*    BMET Recent Labs    03/20/21 0422 03/21/21 0439  NA 141 138  K 3.3* 3.9  CL 106 107  CO2 26 27  GLUCOSE 153* 263*  BUN <5* <5*  CREATININE 0.65 0.75  CALCIUM 8.6* 8.6*   PT/INR No results for  input(s): LABPROT, INR in the last 72 hours.  Recent Labs  Lab 03/18/21 0359 03/19/21 0439 03/20/21 0422 03/21/21 0439  AST 21 20 15  13*  ALT 25 24 20 17   ALKPHOS 51 50 50 51  BILITOT 1.1 0.8 0.7 0.7  PROT 6.2* 6.0* 5.7* 6.3*  ALBUMIN 3.1* 3.0* 2.9* 3.1*     Lipase     Component Value Date/Time   LIPASE 84 (H) 03/09/2021 0443     Medications:  bisoprolol-hydrochlorothiazide  1 tablet Oral Daily   diltiazem  60 mg Oral Q8H   enoxaparin (LOVENOX) injection  40 mg Subcutaneous Daily   feeding supplement (VITAL HIGH PROTEIN)  1,000 mL Per Tube Q24H   insulin aspart  0-15 Units Subcutaneous Q4H   loratadine  10 mg Oral Daily   pantoprazole (PROTONIX) IV  40 mg Intravenous Q12H    sodium chloride 250 mL (03/17/21 1746)   dextrose 5 % and 0.9 % NaCl with KCl 20 mEq/L 125 mL/hr at 03/21/21 0759    Anti-infectives (From admission, onward)    Start     Dose/Rate Route Frequency Ordered Stop   03/20/21 1215  ceFAZolin (ANCEF) 3 g in dextrose 5 % 50 mL IVPB  Status:  Discontinued        3 g 100 mL/hr over 30 Minutes Intravenous  Once 03/20/21 1204 03/20/21 1258   03/20/21 1208  ceFAZolin (ANCEF) IVPB 3g/100 mL premix  Status:  Discontinued  3 g 200 mL/hr over 30 Minutes Intravenous 30 min pre-op 03/20/21 1208 03/20/21 1517       Prior to Admission medications   Medication Sig Start Date End Date Taking? Authorizing Provider  acetaminophen (TYLENOL) 500 MG tablet Take 1,000 mg by mouth every 6 (six) hours as needed for moderate pain.   Yes [provider]  aspirin EC 81 MG tablet Take 81 mg by mouth daily. Swallow whole.   Yes [provider]  bisoprolol-hydrochlorothiazide [facility]) 10-6.25 MG tablet TAKE 1 TABLET BY MOUTH ONCE DAILY 11/13/20 11/13/21 Yes Marguarite Arbour, MD  cetirizine (ZYRTEC) 10 MG tablet Take 10 mg by mouth daily.   Yes [provider]  cholecalciferol (VITAMIN D) 1000 UNITS tablet Take 1,000 Units by mouth daily.    Yes  [provider]  diltiazem (CARDIZEM) 60 MG tablet Take 1 tablet (60 mg total) by mouth every 8 (eight) hours. Patient taking differently: Take 60 mg by mouth daily as needed (for high blood pressure). 12/07/20 12/07/21 Yes Creig Hines, NP  docusate sodium (COLACE) 100 MG capsule Take 100 mg by mouth daily as needed for mild constipation.   Yes [provider]  fenofibrate (TRICOR) 145 MG tablet Take 145 mg by mouth daily.  01/09/17  Yes [provider]  Flaxseed, Linseed, (FLAXSEED OIL) 1200 MG CAPS Take 1,200 mg by mouth daily.   Yes [provider]  fluticasone (FLONASE) 50 MCG/ACT nasal spray Place 1 spray into both nostrils daily as needed for allergies or rhinitis.   Yes [provider]  magnesium gluconate (MAGONATE) 500 MG tablet Take 500 mg by mouth daily.   Yes [provider]  metFORMIN (GLUCOPHAGE) 850 MG tablet Take 1 tablet (850 mg total) by mouth 2 (two) times daily. 06/13/20  Yes   ondansetron (ZOFRAN-ODT) 4 MG disintegrating tablet Dissolve1 tablet (4 mg total) by mouth every 6 (six) hours as needed for nausea. 03/09/21  Yes Cornett, Maisie Fus, MD  OVER THE COUNTER MEDICATION Place 1 application into both ears daily as needed (for irritation in ears). Mometasone flroate topical solution 0.1 % once per month   Yes [provider]  pantoprazole (PROTONIX) 40 MG tablet Take 1 tablet (40 mg total) by mouth daily. 03/09/21 03/09/22 Yes Cornett, Maisie Fus, MD  pioglitazone (ACTOS) 30 MG tablet Take 30 mg by mouth daily.   Yes [provider]  potassium chloride SA (KLOR-CON) 20 MEQ tablet TAKE 1 TABLET BY MOUTH TWO TIMES DAILY Patient taking differently: Take 20 mEq by mouth 2 (two) times daily. 03/28/20 04/14/21 Yes Gollan, Tollie Pizza, MD  propranolol (INDERAL) 20 MG tablet TAKE 1 TABLET BY MOUTH 3  TIMES DAILY AS NEEDED Patient taking differently: Take 20 mg by mouth daily as needed (for high blood pressure). 06/02/17   Yes Gollan, Tollie Pizza, MD  Propylene Glycol (SYSTANE BALANCE OP) Place 1 drop into both eyes daily as needed (dry eyes).   Yes [provider]  QUERCETIN PO Take 500 mg by mouth daily at 8 pm.   Yes [provider]  SODIUM FLUORIDE 5000 SENSITIVE 1.1-5 % GEL 1 application by Mouth Rinse route 2 (two) times daily. 01/29/21  Yes [provider]  Turmeric 500 MG CAPS Take 1,000 mg by mouth daily.   Yes [provider]  zinc gluconate 50 MG tablet Take 50 mg by mouth daily.   Yes [provider]  ondansetron (ZOFRAN-ODT) 4 MG disintegrating tablet Take 1 tablet (4 mg total) by  mouth every 6 (six) hours as needed for nausea or vomiting. Patient not taking: Reported on 03/17/2021 02/20/21   Kinsinger, De Blanch, MD    Assessment/Plan  Severe esophageal stricture with dysphagia S/P lap band removal/Roux-en-Y gastric bypass conversion, 02/18/2021 Dr. Feliciana Rossetti, POD #31 EGD and laparoscopic gastrostomy tube insertion 03/20/2021 Dr. Feliciana Rossetti, POD #1  FEN: Tube feeding/IV fluids/bariatric clear  -Tube feeding vital high-protein at 20 mils per hour ID: Ancef preop DVT: Lovenox ordered  Obesity BMI 36.68 Hx CAD/AF/mild MR/grade 1 diastolic dysfunction/Hx PSVT  -  cleared for surgery 12/06/20  - PAF stable on BB Type 2 diabetes  -Sliding scale insulin OSA/CPAP   Plan: He started on tube feedings he has a clear liquid bariatric diet ordered.  I recommended he do that sitting up in the chair.  He has a sliding scale insulin and I have asked the diabetes coordinator to see and assist with his glucose management.  We will begin mobilizing today and see how he does.  Replace magnesium, decrease IV fluids.  Will discuss with Dr. Dossie Der.  Recheck labs in the a.m.  LOS: 4 days    Hector Curry 03/21/2021 Please see Amion

## 2021-03-21 NOTE — Progress Notes (Signed)
Inpatient Diabetes Program Recommendations  AACE/ADA: New Consensus Statement on Inpatient Glycemic Control (2015)  Target Ranges:  Prepandial:   less than 140 mg/dL      Peak postprandial:   less than 180 mg/dL (1-2 hours)      Critically ill patients:  140 - 180 mg/dL   Lab Results  Component Value Date   GLUCAP 179 (H) 03/21/2021   HGBA1C 7.3 (H) 03/06/2021    Review of Glycemic Control  Diabetes history: DM2 Outpatient Diabetes medications: Actos 30 mg QD, metformin 850 mg BID Current orders for Inpatient glycemic control: Novolog 0-15 units Q4H  Inpatient Diabetes Program Recommendations:    Novolog 3 units Q4H for TF coverage. Do not give if TF Held for any reason.  Discussed with PA.  Thank you. Ailene Ards, RD, LDN, CDE Inpatient Diabetes Coordinator [phone]

## 2021-03-21 NOTE — TOC Initial Note (Signed)
Transition of Care [facility]) - Initial/Assessment Note    Patient Details  Name: Hector Curry MRN: [id number] Date of Birth: 16-Feb-1961  Transition [facility]) CM/SW Contact:    Lennart Pall, LCSW Phone Number: 03/21/2021, 3:42 PM  Clinical Narrative:                 Met with pt today to introduce self/ TOC role.  Pt very pleasant and states he has very good support from his wife at home.  He has a good understanding of his medical course and plans for bolus peg feedings at home.  Discussed need for [facility] as well (no agency preference).   Have placed [facility] referral with [place].  Currently awaiting final tube feeding regimen to be determined and will then place referral for tube feedings for home use.  Continue to follow.  Expected Discharge Plan: [place] Barriers to Discharge: Continued Medical Work up   Patient Goals and CMS Choice Patient states their goals for this hospitalization and ongoing recovery are:: return home      Expected Discharge Plan and Services Expected Discharge Plan: [place] In-house Referral: Clinical Social Work     Living arrangements for the past 2 months: [place]: RN [place] Agency: [place] ([place]) Date HH Agency Contacted: 03/21/21 Time [place]: 1400 Representative spoke with at [place]: Ramond Marrow B.  Prior Living Arrangements/Services Living arrangements for the past 2 months: Single Family Home Lives with:: Spouse Patient language and need for interpreter reviewed:: Yes Do you feel safe going back to the place where you live?: Yes      Need for Family Participation in Patient Care: Yes (Comment) Care giver support system in place?: Yes (comment)   Criminal Activity/Legal Involvement Pertinent to Current Situation/Hospitalization: No - Comment as needed  Activities of Daily Living Home Assistive  Devices/Equipment: BIPAP, CBG Meter, Eyeglasses ADL Screening (condition at time of admission) Patient's cognitive ability adequate to safely complete daily activities?: Yes Is the patient deaf or have difficulty hearing?: No Does the patient have difficulty seeing, even when wearing glasses/contacts?: No Does the patient have difficulty concentrating, remembering, or making decisions?: No Patient able to express need for assistance with ADLs?: Yes Does the patient have difficulty dressing or bathing?: No Independently performs ADLs?: Yes (appropriate for developmental age) Does the patient have difficulty walking or climbing stairs?: No Weakness of Legs: None Weakness of Arms/Hands: None  Permission Sought/Granted Permission sought to share information with : Family Supports Permission granted to share information with : Yes, Verbal Permission Granted  Share Information with NAME: Laban Emperor     Permission granted to share info w Relationship: spouse  Permission granted to share info w Contact Information: [phone]  Emotional Assessment Appearance:: Appears stated age Attitude/Demeanor/Rapport: Engaged, Gracious Affect (typically observed): Accepting, Adaptable Orientation: : Oriented to Self, Oriented to Place, Oriented to  Time, Oriented to Situation Alcohol / Substance Use: Not Applicable Psych Involvement: No (comment)  Admission diagnosis:  Dehydration [E86.0] 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   Morbid obesity ([place]) 02/18/2021   History of kidney stones 05/30/2016   Elevated LFTs 02/13/2016   Morbid  obesity with BMI of 40.0-44.9, adult ([place]) 12/20/2015   Atrial tachycardia ([place]) 06/21/2015   Hypertriglyceridemia 05/15/2015   Coronary artery disease, non-occlusive    Type II diabetes mellitus, uncontrolled ([place]) 05/08/2015   Chest  pain at rest    Hyperglycemia 05/07/2015   Chest pain 05/06/2015   GERD (gastroesophageal reflux disease)    Kidney stones    Hemorrhoids    Inflammatory arthritis    Family history of premature CAD    OSA (obstructive sleep apnea)    History of tobacco abuse    Palpitations    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/20/2014   Diverticulosis 10/03/2014   Hyperplastic colon polyp 10/03/2014   HTN, goal below 140/90 08/07/2014   Pain in the chest 06/19/2014   SOB (shortness of breath) 06/19/2014   Obesity 06/19/2014   Hyperlipidemia 06/19/2014   PCP:  [name], MD Pharmacy:   [facility] [place] [address] Phone: [phone] Fax: [phone]     Social Determinants of Health (SDOH) Interventions    Readmission Risk Interventions No flowsheet data found.

## 2021-03-22 DIAGNOSIS — E440 Moderate protein-calorie malnutrition: Secondary | ICD-10-CM | POA: Insufficient documentation

## 2021-03-22 LAB — CBC
HCT: 42.5 % (ref 39.0–52.0)
Hemoglobin: 13.3 g/dL (ref 13.0–17.0)
MCH: 28.4 pg (ref 26.0–34.0)
MCHC: 31.3 g/dL (ref 30.0–36.0)
MCV: 90.8 fL (ref 80.0–100.0)
Platelets: 185 10*3/uL (ref 150–400)
RBC: 4.68 MIL/uL (ref 4.22–5.81)
RDW: 15.3 % (ref 11.5–15.5)
WBC: 11.3 10*3/uL — ABNORMAL HIGH (ref 4.0–10.5)
nRBC: 0 % (ref 0.0–0.2)

## 2021-03-22 LAB — PHOSPHORUS: Phosphorus: 2.5 mg/dL (ref 2.5–4.6)

## 2021-03-22 LAB — COMPREHENSIVE METABOLIC PANEL
ALT: 15 U/L (ref 0–44)
AST: 15 U/L (ref 15–41)
Albumin: 3.1 g/dL — ABNORMAL LOW (ref 3.5–5.0)
Alkaline Phosphatase: 51 U/L (ref 38–126)
Anion gap: 7 (ref 5–15)
BUN: <5 mg/dL — ABNORMAL LOW (ref 6–20)
CO2: 27 mmol/L (ref 22–32)
Calcium: 8.7 mg/dL — ABNORMAL LOW (ref 8.9–10.3)
Chloride: 101 mmol/L (ref 98–111)
Creatinine, Ser: 0.71 mg/dL (ref 0.61–1.24)
GFR, Estimated: >60 mL/min (ref >60)
Glucose, Bld: 202 mg/dL — ABNORMAL HIGH (ref 70–99)
Potassium: 3.8 mmol/L (ref 3.5–5.1)
Sodium: 135 mmol/L (ref 135–145)
Total Bilirubin: 0.6 mg/dL (ref 0.3–1.2)
Total Protein: 6.4 g/dL — ABNORMAL LOW (ref 6.5–8.1)

## 2021-03-22 LAB — GLUCOSE, CAPILLARY
Glucose-Capillary: 135 mg/dL — ABNORMAL HIGH (ref 70–99)
Glucose-Capillary: 140 mg/dL — ABNORMAL HIGH (ref 70–99)
Glucose-Capillary: 157 mg/dL — ABNORMAL HIGH (ref 70–99)
Glucose-Capillary: 175 mg/dL — ABNORMAL HIGH (ref 70–99)
Glucose-Capillary: 184 mg/dL — ABNORMAL HIGH (ref 70–99)
Glucose-Capillary: 202 mg/dL — ABNORMAL HIGH (ref 70–99)

## 2021-03-22 LAB — MAGNESIUM: Magnesium: 1.8 mg/dL (ref 1.7–2.4)

## 2021-03-22 MED ORDER — VITAL HIGH PROTEIN PO LIQD
1000.0000 mL | ORAL | Status: DC
  Administered 2021-03-22: 1000 mL
  Filled 2021-03-22: qty 1000

## 2021-03-22 MED ORDER — UNJURY CHICKEN SOUP POWDER: 8.0000 [oz_av] | Freq: Two times a day (BID) | ORAL | Status: DC

## 2021-03-22 MED ORDER — DILTIAZEM HCL 60 MG PO TABS
60.0000 mg | ORAL_TABLET | Freq: Three times a day (TID) | ORAL | Status: DC
  Administered 2021-03-22 – 2021-03-25 (×9): 60 mg
  Filled 2021-03-22 (×10): qty 1

## 2021-03-22 MED ORDER — PROSOURCE TF PO LIQD
45.0000 mL | Freq: Two times a day (BID) | ORAL | Status: DC
  Administered 2021-03-22: 45 mL
  Filled 2021-03-22 (×7): qty 45

## 2021-03-22 MED ORDER — MAGNESIUM SULFATE 2 GM/50ML IV SOLN
2.0000 g | Freq: Once | INTRAVENOUS | Status: AC
  Administered 2021-03-22: 2 g via INTRAVENOUS
  Filled 2021-03-22: qty 50

## 2021-03-22 MED ORDER — LORATADINE 10 MG PO TABS
10.0000 mg | ORAL_TABLET | Freq: Every day | ORAL | Status: DC
  Administered 2021-03-22: 10 mg
  Filled 2021-03-22 (×3): qty 1

## 2021-03-22 NOTE — Progress Notes (Signed)
Progress Note: General Surgery Service   Chief Complaint/Subjective: Doing well this morning, some pain around the g-tube, no acute events overnight.  Objective: Vital signs in last 24 hours: Temp:  [97.4 F (36.3 C)-98.2 F (36.8 C)] 98.1 F (36.7 C) (07/29 0443) Pulse Rate:  [56-77] 66 (07/29 0443) Resp:  [16-18] 16 (07/29 0443) BP: (102-139)/(68-84) 124/77 (07/29 0443) SpO2:  [94 %-97 %] 94 % (07/29 0443) Last BM Date: 03/20/21  Intake/Output from previous day: 07/28 0701 - 07/29 0700 In: 1210 [P.O.:460; NG/GT:520; IV Piggyback:50] Out: 1200 [Urine:1200] Intake/Output this shift: No intake/output data recorded.   GI: Abd G-tube in place, functioning, with tube feeds running.  Incisions healing, clean dry intact with glue.  Lab Results: CBC  Recent Labs    03/21/21 0439 03/22/21 0444  WBC 8.5 11.3*  HGB 13.3 13.3  HCT 41.7 42.5  PLT 143* 185   BMET Recent Labs    03/21/21 0439 03/22/21 0444  NA 138 135  K 3.9 3.8  CL 107 101  CO2 27 27  GLUCOSE 263* 202*  BUN <5* <5*  CREATININE 0.75 0.71  CALCIUM 8.6* 8.7*   PT/INR No results for input(s): LABPROT, INR in the last 72 hours. ABG No results for input(s): PHART, HCO3 in the last 72 hours.  Invalid input(s): PCO2, PO2  Anti-infectives: Anti-infectives (From admission, onward)    Start     Dose/Rate Route Frequency Ordered Stop   03/20/21 1215  ceFAZolin (ANCEF) 3 g in dextrose 5 % 50 mL IVPB  Status:  Discontinued        3 g 100 mL/hr over 30 Minutes Intravenous  Once 03/20/21 1204 03/20/21 1258   03/20/21 1208  ceFAZolin (ANCEF) IVPB 3g/100 mL premix  Status:  Discontinued        3 g 200 mL/hr over 30 Minutes Intravenous 30 min pre-op 03/20/21 1208 03/20/21 1517       Medications: Scheduled Meds:  bisoprolol-hydrochlorothiazide  1 tablet Oral Daily   diltiazem  60 mg Per Tube Q8H   enoxaparin (LOVENOX) injection  40 mg Subcutaneous Daily   feeding supplement (VITAL HIGH PROTEIN)  1,000 mL  Per Tube Q24H   insulin aspart  0-15 Units Subcutaneous Q4H   insulin aspart  3 Units Subcutaneous Q4H   loratadine  10 mg Per Tube Daily   pantoprazole (PROTONIX) IV  40 mg Intravenous Q12H   protein supplement  2 oz Oral QID   Continuous Infusions:  sodium chloride 250 mL (03/17/21 1746)   sodium phosphate  Dextrose 5% IVPB     PRN Meds:.sodium chloride, acetaminophen **OR** acetaminophen, acetaminophen, diphenhydrAMINE **OR** diphenhydrAMINE, fluticasone, metoprolol tartrate, morphine injection, ondansetron **OR** ondansetron (ZOFRAN) IV, oxyCODONE, propranolol  Assessment/Plan: Hector Curry is a 60 year old male who underwent difficult laparoscopic conversion of laparoscopic adjustable gastric band to gastric bypass on 02/18/2021.  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 This was unable to be traversed with an endoscope, so he underwent laparoscopic remnant gastrostomy tube placement 03/20/2021.  He is only able to tolerate sips of liquids by mouth, but hopefully with time and healing this stricture improves or is able to be intervened on endoscopically.  -Replace magnesium, monitor for refeeding syndrome -Slowly advance tube feeds, discuss reducing carbohydrates in formula with nutrition team -Goal would be to transition to bolus tube feeds by Monday and discharge home with bolus tube feeds - IV protonix q12h - transition to enteral PPI prior to discharge   Moderate Malnutrition - related to acute  illness as evidenced by percent weight loss, energy intake < or equal to 75% for > or equal to 1 month. Diabetes type 2 - SSI A. Fib - continue home antiarrhythmics, not on anticoagulation Diastolic dysfunction, CAD, GERD, HLD, HTN - continue home meds OSA - CPAP at night   LOS: 5 days   FEN: TF advancing ID: none VTE: SCDs, Lovenox Foley: none Dispo: Continued titration of tube feeds over the weekend    Hector Ore, MD  [facility] Use  [email or web address] to contact on call provider

## 2021-03-22 NOTE — Consult Note (Signed)
   [facility] Inpatient Consult   03/22/2021  Hector Curry 02-17-1961 [id number]   [facility] [[facility]]  Accountable Care Organization [ACO] Patient: [name] plan  Patient reviewed for [facility] Care Management for chronic disease management services.  Patient to be contacted by a [facility] Care Coordinator for the [facility] plan for additional post hospital support and follow up.       Plan: Patient is to be followed by [facility] RN Care Coordinator for the support plan with [facility].   For additional questions or referrals please contact:   Charlesetta Shanks, RN BSN CCM [facility]  [phone] business mobile phone Toll free office [phone]  Fax number: [phone] [email or web address]

## 2021-03-22 NOTE — Progress Notes (Signed)
Nutrition Follow-up  DOCUMENTATION CODES:   Non-severe (moderate) malnutrition in context of acute illness/injury, Obesity unspecified  INTERVENTION:   Monitor magnesium, potassium, and phosphorus daily for at least 3 days, MD to replete as needed, as pt is at risk for refeeding syndrome.  -Surgery advancing Vital HP to 40 ml/hr tonight via G-tube in remnant stomach -Provides 960 kcals, 84g protein, 106g CHO and 802 ml H2O  -Will add 45 ml Prosource TF BID, each provides 40 kcals and 11g protein (<1 g CHO)  -Continue [place] Chicken Soup 8 oz BID, as tolerated, each 8 oz provides 90 kcals and 21g protein (1g CHO)  Bolus regimen recommendations: -Begin with 50 ml Osmolite 1.2 via G-tube every 2 hours (8 feedings total while awake) -Free water flush with 30 ml before and after each bolus As tolerated work up to 100 ml Osmolite 1.2 7 times daily (~every 2 hours). -Continue Prosource TF BID. -3 cartons total of Osmolite 1.2 + Prosource TF BID provides 935 kcals (66% of needs), 61g protein (77% of needs), 112g CHO and 1005 ml H2O. -Free water of 240 ml TID via G-tube or PO -Will meet rest of needs with other liquids/diet and protein supplements  -Pt to resume daily bariatric MVI and calcium once tolerating POs.  NUTRITION DIAGNOSIS:   Moderate Malnutrition related to acute illness as evidenced by percent weight loss, energy intake < or equal to 75% for > or equal to 1 month.  Ongoing.  GOAL:   Patient will meet greater than or equal to 90% of their needs  Progressing.  MONITOR:   PO intake, Labs, Weight trends, TF tolerance, I & O's  ASSESSMENT:   60 year old man who is approximately 5 weeks status post removal of lap band and conversion to Roux-en-Y gastric bypass.  He is readmitted for the second time with p.o. intolerance.  At last admission which was only 1 day, he did have a CT scan without significant abnormality.  Upper GI revealed narrowing at the band site with  delayed passage of contrast.  He did have some improvement in his symptoms and was discharged home 8 days ago.  The patient endorses ongoing intolerance to liquids.  7/24: admitted 7/27: s/p laparoscopic gastrostomy tube insertion, upper endoscopy   Received message from surgery inquiring if there is a lower carbohydrate formula that pt can use given elevated CBGs. Pt is currently receiving the lowest carb formula we have on formulary. At 30 ml/hr on Vital HP he is receiving a total of 79g of CHO daily. At 40 ml/hr he will receive 106g CHO daily. When transitions to bolus feeds he will receive a similar amount of 112g of CHO daily.  Will add Prosource  today for additional protein.   Admission weight: 288 lbs. Lowest weight this admission 278 lbs. Daily weights ordered via TF protocol.  Medications: IV Mg sulfate  Labs reviewed: CBGs: 155-202  Diet Order:   Diet Order             Diet bariatric clear liquid Room service appropriate? Yes; Fluid consistency: Thin  Diet effective now                   EDUCATION NEEDS:   Education needs have been addressed  Skin:  Skin Assessment: Skin Integrity Issues: Skin Integrity Issues:: Incisions Incisions: 7/27 abdomen  Last BM:  7/27  Height:   Ht Readings from Last 1 Encounters:  03/19/21 6\' 2"  (1.88 m)    Weight:  Wt Readings from Last 1 Encounters:  03/21/21 129.6 kg    BMI:  Body mass index is 36.68 kg/m.  Estimated Nutritional Needs:   Kcal:  1400-1600  Protein:  80-100g  Fluid:  2L/day   Tilda Franco, MS, RD, LDN Inpatient Clinical Dietitian Contact information available via Amion

## 2021-03-23 LAB — GLUCOSE, CAPILLARY
Glucose-Capillary: 134 mg/dL — ABNORMAL HIGH (ref 70–99)
Glucose-Capillary: 140 mg/dL — ABNORMAL HIGH (ref 70–99)
Glucose-Capillary: 146 mg/dL — ABNORMAL HIGH (ref 70–99)
Glucose-Capillary: 150 mg/dL — ABNORMAL HIGH (ref 70–99)
Glucose-Capillary: 153 mg/dL — ABNORMAL HIGH (ref 70–99)
Glucose-Capillary: 153 mg/dL — ABNORMAL HIGH (ref 70–99)
Glucose-Capillary: 159 mg/dL — ABNORMAL HIGH (ref 70–99)

## 2021-03-23 LAB — BASIC METABOLIC PANEL
Anion gap: 10 (ref 5–15)
BUN: 10 mg/dL (ref 6–20)
CO2: 30 mmol/L (ref 22–32)
Calcium: 8.9 mg/dL (ref 8.9–10.3)
Chloride: 97 mmol/L — ABNORMAL LOW (ref 98–111)
Creatinine, Ser: 0.71 mg/dL (ref 0.61–1.24)
GFR, Estimated: >60 mL/min (ref >60)
Glucose, Bld: 159 mg/dL — ABNORMAL HIGH (ref 70–99)
Potassium: 3.8 mmol/L (ref 3.5–5.1)
Sodium: 137 mmol/L (ref 135–145)

## 2021-03-23 LAB — PHOSPHORUS: Phosphorus: 4.9 mg/dL — ABNORMAL HIGH (ref 2.5–4.6)

## 2021-03-23 LAB — MAGNESIUM: Magnesium: 1.9 mg/dL (ref 1.7–2.4)

## 2021-03-23 MED ORDER — BISACODYL 10 MG RE SUPP
10.0000 mg | Freq: Once | RECTAL | Status: DC
  Filled 2021-03-23: qty 1

## 2021-03-23 MED ORDER — OSMOLITE 1.2 CAL PO LIQD: 50.0000 mL | ORAL | Status: DC

## 2021-03-23 MED ORDER — DIPHENHYDRAMINE HCL 25 MG PO CAPS: 25.0000 mg | ORAL_CAPSULE | Freq: Four times a day (QID) | ORAL | Status: DC | PRN

## 2021-03-23 MED ORDER — MELATONIN 3 MG PO TABS
3.0000 mg | ORAL_TABLET | Freq: Every day | ORAL | Status: DC
  Administered 2021-03-23 – 2021-03-24 (×2): 3 mg
  Filled 2021-03-23 (×2): qty 1

## 2021-03-23 MED ORDER — DIPHENHYDRAMINE HCL 50 MG/ML IJ SOLN: 25.0000 mg | Freq: Four times a day (QID) | INTRAMUSCULAR | Status: DC | PRN

## 2021-03-23 MED ORDER — FREE WATER: 30.0000 mL | Status: DC

## 2021-03-23 MED ORDER — VITAL HIGH PROTEIN PO LIQD
1000.0000 mL | ORAL | Status: DC
  Administered 2021-03-23: 1000 mL
  Filled 2021-03-23: qty 1000

## 2021-03-23 NOTE — Progress Notes (Signed)
Notified by bedside RN that patient has been feeling more full and uncomfortable this morning. Will keep on continuous feeds instead of transitioning to bolus. If symptoms worsen, will decrease rate.

## 2021-03-23 NOTE — Progress Notes (Signed)
Progress Note: General Surgery Service   Chief Complaint/Subjective: Tolerating continuous feeds at 41ml/hr. Reports some belching and reflux overnight. Had difficulty sleeping, requesting sleep aid.  Objective: Vital signs in last 24 hours: Temp:  [97.5 F (36.4 C)-98.5 F (36.9 C)] 97.5 F (36.4 C) (07/30 0912) Pulse Rate:  [53-62] 57 (07/30 0912) Resp:  [17-18] 18 (07/30 0912) BP: (111-127)/(70-75) 124/70 (07/30 0912) SpO2:  [94 %-97 %] 94 % (07/30 0912) Last BM Date: 03/20/21  Intake/Output from previous day: 07/29 0701 - 07/30 0700 In: 976 [P.O.:120; NG/GT:626; IV Piggyback:50] Out: 2100 [Urine:2100] Intake/Output this shift: No intake/output data recorded.  Gen: resting comfortably, NAD Neuro: alert and oriented Resp: normal work of breathing GI: Incisions c/d/I. LUQ G tube in place with feeds running at 40 ml/hr.  Lab Results: CBC  Recent Labs    03/21/21 0439 03/22/21 0444  WBC 8.5 11.3*  HGB 13.3 13.3  HCT 41.7 42.5  PLT 143* 185   BMET Recent Labs    03/21/21 0439 03/22/21 0444  NA 138 135  K 3.9 3.8  CL 107 101  CO2 27 27  GLUCOSE 263* 202*  BUN <5* <5*  CREATININE 0.75 0.71  CALCIUM 8.6* 8.7*   PT/INR No results for input(s): LABPROT, INR in the last 72 hours. ABG No results for input(s): PHART, HCO3 in the last 72 hours.  Invalid input(s): PCO2, PO2  Anti-infectives: Anti-infectives (From admission, onward)    Start     Dose/Rate Route Frequency Ordered Stop   03/20/21 1215  ceFAZolin (ANCEF) 3 g in dextrose 5 % 50 mL IVPB  Status:  Discontinued        3 g 100 mL/hr over 30 Minutes Intravenous  Once 03/20/21 1204 03/20/21 1258   03/20/21 1208  ceFAZolin (ANCEF) IVPB 3g/100 mL premix  Status:  Discontinued        3 g 200 mL/hr over 30 Minutes Intravenous 30 min pre-op 03/20/21 1208 03/20/21 1517       Medications: Scheduled Meds:  bisoprolol-hydrochlorothiazide  1 tablet Oral Daily   diltiazem  60 mg Per Tube Q8H   enoxaparin  (LOVENOX) injection  40 mg Subcutaneous Daily   feeding supplement (PROSource TF)  45 mL Per Tube BID   feeding supplement (VITAL HIGH PROTEIN)  1,000 mL Per Tube Q24H   insulin aspart  0-15 Units Subcutaneous Q4H   insulin aspart  3 Units Subcutaneous Q4H   loratadine  10 mg Per Tube Daily   melatonin  3 mg Per Tube QHS   pantoprazole (PROTONIX) IV  40 mg Intravenous Q12H   protein supplement  8 oz Oral BID WC   Continuous Infusions:  sodium chloride 250 mL (03/17/21 1746)   PRN Meds:.sodium chloride, acetaminophen **OR** acetaminophen, acetaminophen, diphenhydrAMINE **OR** diphenhydrAMINE, fluticasone, metoprolol tartrate, morphine injection, ondansetron **OR** ondansetron (ZOFRAN) IV, oxyCODONE, propranolol  Assessment/Plan: Mr. Rape is a 60 year old male who underwent difficult laparoscopic conversion of laparoscopic adjustable gastric band to gastric bypass on 02/18/2021.  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 This was unable to be traversed with an endoscope, so he underwent laparoscopic remnant gastrostomy tube placement 03/20/2021.  He is only able to tolerate sips of liquids by mouth, but hopefully with time and healing this stricture improves or is able to be intervened on endoscopically.  -Monitor for refeeding syndrome. Labs pending this morning. -Tolerating continuous feeds at goal, transition to bolus feeds today - IV protonix q12h - transition to enteral PPI prior to discharge - Melatonin  qhs for sleep, benadryl prn  Moderate Malnutrition - related to acute illness as evidenced by percent weight loss, energy intake < or equal to 75% for > or equal to 1 month. Diabetes type 2 - SSI A. Fib - continue home antiarrhythmics, not on anticoagulation Diastolic dysfunction, CAD, GERD, HLD, HTN - continue home meds OSA - CPAP at night   LOS: 6 days   FEN: TF advancing ID: none VTE: SCDs, Lovenox Foley: none Dispo: possible discharge home Monday 8/1 if  tolerating bolus feeds    Fritzi Mandes, MD [facility] Use [email or web address] to contact on call provider

## 2021-03-24 LAB — GLUCOSE, CAPILLARY
Glucose-Capillary: 117 mg/dL — ABNORMAL HIGH (ref 70–99)
Glucose-Capillary: 147 mg/dL — ABNORMAL HIGH (ref 70–99)
Glucose-Capillary: 157 mg/dL — ABNORMAL HIGH (ref 70–99)
Glucose-Capillary: 160 mg/dL — ABNORMAL HIGH (ref 70–99)
Glucose-Capillary: 164 mg/dL — ABNORMAL HIGH (ref 70–99)
Glucose-Capillary: 167 mg/dL — ABNORMAL HIGH (ref 70–99)

## 2021-03-24 LAB — PHOSPHORUS: Phosphorus: 5 mg/dL — ABNORMAL HIGH (ref 2.5–4.6)

## 2021-03-24 LAB — MAGNESIUM: Magnesium: 1.8 mg/dL (ref 1.7–2.4)

## 2021-03-24 MED ORDER — OSMOLITE 1.2 CAL PO LIQD
50.0000 mL | ORAL | Status: DC
  Administered 2021-03-24 – 2021-03-25 (×5): 50 mL
  Filled 2021-03-24 (×13): qty 237

## 2021-03-24 MED ORDER — PANTOPRAZOLE SODIUM 40 MG PO TBEC
40.0000 mg | DELAYED_RELEASE_TABLET | Freq: Two times a day (BID) | ORAL | Status: DC
  Administered 2021-03-24 – 2021-03-25 (×3): 40 mg via ORAL
  Filled 2021-03-24 (×3): qty 1

## 2021-03-24 NOTE — Progress Notes (Signed)
Progress Note: General Surgery Service   Chief Complaint/Subjective: Had a bowel movement last night and passing flatus, feels much better today. Tolerating continuous feeds at goal.   Objective: Vital signs in last 24 hours: Temp:  [97.3 F (36.3 C)-98.6 F (37 C)] 97.3 F (36.3 C) (07/31 0425) Pulse Rate:  [54-62] 61 (07/31 0425) Resp:  [18-19] 18 (07/31 0425) BP: (109-126)/(63-72) 117/63 (07/31 0425) SpO2:  [93 %-96 %] 96 % (07/31 0425) Weight:  [134.3 kg] 134.3 kg (07/31 0431) Last BM Date: 03/23/21  Intake/Output from previous day: 07/30 0701 - 07/31 0700 In: 480 [NG/GT:480] Out: 0  Intake/Output this shift: No intake/output data recorded.  Gen: resting comfortably, NAD Neuro: alert and oriented Resp: normal work of breathing GI: Incisions c/d/I. Abdomen nontender to palpation. LUQ G tube in place with feeds running at 40 ml/hr.  Lab Results: CBC  Recent Labs    03/22/21 0444  WBC 11.3*  HGB 13.3  HCT 42.5  PLT 185   BMET Recent Labs    03/22/21 0444 03/23/21 0930  NA 135 137  K 3.8 3.8  CL 101 97*  CO2 27 30  GLUCOSE 202* 159*  BUN <5* 10  CREATININE 0.71 0.71  CALCIUM 8.7* 8.9   PT/INR No results for input(s): LABPROT, INR in the last 72 hours. ABG No results for input(s): PHART, HCO3 in the last 72 hours.  Invalid input(s): PCO2, PO2  Anti-infectives: Anti-infectives (From admission, onward)    Start     Dose/Rate Route Frequency Ordered Stop   03/20/21 1215  ceFAZolin (ANCEF) 3 g in dextrose 5 % 50 mL IVPB  Status:  Discontinued        3 g 100 mL/hr over 30 Minutes Intravenous  Once 03/20/21 1204 03/20/21 1258   03/20/21 1208  ceFAZolin (ANCEF) IVPB 3g/100 mL premix  Status:  Discontinued        3 g 200 mL/hr over 30 Minutes Intravenous 30 min pre-op 03/20/21 1208 03/20/21 1517       Medications: Scheduled Meds:  bisacodyl  10 mg Rectal Once   bisoprolol-hydrochlorothiazide  1 tablet Oral Daily   diltiazem  60 mg Per Tube Q8H    enoxaparin (LOVENOX) injection  40 mg Subcutaneous Daily   feeding supplement (OSMOLITE 1.2 CAL)  50 mL Per Tube Q2H while awake   feeding supplement (PROSource TF)  45 mL Per Tube BID   insulin aspart  0-15 Units Subcutaneous Q4H   insulin aspart  3 Units Subcutaneous Q4H   loratadine  10 mg Per Tube Daily   melatonin  3 mg Per Tube QHS   pantoprazole (PROTONIX) IV  40 mg Intravenous Q12H   protein supplement  8 oz Oral BID WC   Continuous Infusions:  sodium chloride 250 mL (03/17/21 1746)   PRN Meds:.sodium chloride, acetaminophen **OR** acetaminophen, acetaminophen, diphenhydrAMINE **OR** diphenhydrAMINE, fluticasone, metoprolol tartrate, morphine injection, ondansetron **OR** ondansetron (ZOFRAN) IV, oxyCODONE, propranolol  Assessment/Plan: Hector Curry is a 60 year old male who underwent difficult laparoscopic conversion of laparoscopic adjustable gastric band to gastric bypass on 02/18/2021.  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 This was unable to be traversed with an endoscope, so he underwent laparoscopic remnant gastrostomy tube placement 03/20/2021.  He is only able to tolerate sips of liquids by mouth, but hopefully with time and healing this stricture improves or is able to be intervened on endoscopically.  -No signs of refeeding syndrome. Phos elevated yesterday, repeat mag and phos pending today. -Tolerating continuous feeds  at goal, transition to bolus feeds starting at 50 ml q2h, advance slowly to 100 ml per bolus. - IV protonix q12h - transition to enteral PPI today - Melatonin qhs for sleep, benadryl prn  Moderate Malnutrition - related to acute illness as evidenced by percent weight loss, energy intake < or equal to 75% for > or equal to 1 month. Diabetes type 2 - SSI A. Fib - continue home antiarrhythmics, not on anticoagulation Diastolic dysfunction, CAD, GERD, HLD, HTN - continue home meds OSA - CPAP at night   LOS: 7 days   FEN: Transitioning to  bolus feeds today VTE: SCDs, Lovenox Dispo: possible discharge home Monday 8/1 if tolerating bolus feeds    Fritzi Mandes, MD [facility] Use [email or web address] to contact on call provider

## 2021-03-25 ENCOUNTER — Other Ambulatory Visit (HOSPITAL_COMMUNITY): Payer: Self-pay

## 2021-03-25 LAB — GLUCOSE, CAPILLARY
Glucose-Capillary: 143 mg/dL — ABNORMAL HIGH (ref 70–99)
Glucose-Capillary: 152 mg/dL — ABNORMAL HIGH (ref 70–99)

## 2021-03-25 MED ORDER — OSMOLITE 1.2 CAL PO LIQD
1000.0000 mL | ORAL | 20 refills | Status: DC
  Filled 2021-03-25: qty 1000, 30d supply, fill #0

## 2021-03-25 MED ORDER — PROSOURCE TF PO LIQD
45.0000 mL | Freq: Two times a day (BID) | ORAL | 5 refills | Status: DC
  Filled 2021-03-25: qty 2700, 30d supply, fill #0

## 2021-03-25 MED ORDER — OSMOLITE 1.2 CAL PO LIQD
75.0000 mL | ORAL | Status: DC
  Administered 2021-03-25 (×2): 75 mL
  Filled 2021-03-25 (×6): qty 237

## 2021-03-25 MED ORDER — OXYCODONE-ACETAMINOPHEN 5-325 MG PO TABS
1.0000 | ORAL_TABLET | ORAL | 0 refills | Status: DC | PRN
  Filled 2021-03-25: qty 15, 3d supply, fill #0

## 2021-03-25 MED ORDER — PANTOPRAZOLE SODIUM 40 MG PO TBEC
40.0000 mg | DELAYED_RELEASE_TABLET | Freq: Two times a day (BID) | ORAL | 3 refills | Status: DC
  Filled 2021-03-25: qty 180, 90d supply, fill #0

## 2021-03-25 NOTE — Progress Notes (Signed)
Progress Note  5 Days Post-Op  Subjective: Patient discharged by Dr. Dossie Der this AM. Working on sorting out home tube feeds and if everything needed is arranged. Patient reports feeling full with current TF rate of 50 cc q2h.   Objective: Vital signs in last 24 hours: Temp:  [97.6 F (36.4 C)-98.4 F (36.9 C)] 98.3 F (36.8 C) (07/31 1941) Pulse Rate:  [59-69] 59 (08/01 0417) Resp:  [16-19] 16 (08/01 0417) BP: (112-127)/(61-73) 127/71 (08/01 0417) SpO2:  [92 %-95 %] 95 % (08/01 0417) Weight:  [132.6 kg] 132.6 kg (08/01 0500) Last BM Date: 03/24/21  Intake/Output from previous day: 07/31 0701 - 08/01 0700 In: 100 [NG/GT:100] Out: 2 [Urine:1; Stool:1] Intake/Output this shift: No intake/output data recorded.  PE: General: pleasant, WD, obese male who is laying in bed in NAD Heart: regular, rate, and rhythm.  Lungs: Respiratory effort nonlabored Abd: soft, NT, ND, gastrostomy in place MS: all 4 extremities are symmetrical with no cyanosis, clubbing, or edema. Psych: A&Ox3 with an appropriate affect.    Lab Results:  No results for input(s): WBC, HGB, HCT, PLT in the last 72 hours. BMET Recent Labs    03/23/21 0930  NA 137  K 3.8  CL 97*  CO2 30  GLUCOSE 159*  BUN 10  CREATININE 0.71  CALCIUM 8.9   PT/INR No results for input(s): LABPROT, INR in the last 72 hours. CMP     Component Value Date/Time   NA 137 03/23/2021 0930   K 3.8 03/23/2021 0930   CL 97 (L) 03/23/2021 0930   CO2 30 03/23/2021 0930   GLUCOSE 159 (H) 03/23/2021 0930   BUN 10 03/23/2021 0930   CREATININE 0.71 03/23/2021 0930   CALCIUM 8.9 03/23/2021 0930   PROT 6.4 (L) 03/22/2021 0444   ALBUMIN 3.1 (L) 03/22/2021 0444   AST 15 03/22/2021 0444   ALT 15 03/22/2021 0444   ALKPHOS 51 03/22/2021 0444   BILITOT 0.6 03/22/2021 0444   GFRNONAA >60 03/23/2021 0930   GFRAA >60 05/22/2020 0827   Lipase     Component Value Date/Time   LIPASE 84 (H) 03/09/2021 0443        Studies/Results: No results found.  Anti-infectives: Anti-infectives (From admission, onward)    Start     Dose/Rate Route Frequency Ordered Stop   03/20/21 1215  ceFAZolin (ANCEF) 3 g in dextrose 5 % 50 mL IVPB  Status:  Discontinued        3 g 100 mL/hr over 30 Minutes Intravenous  Once 03/20/21 1204 03/20/21 1258   03/20/21 1208  ceFAZolin (ANCEF) IVPB 3g/100 mL premix  Status:  Discontinued        3 g 200 mL/hr over 30 Minutes Intravenous 30 min pre-op 03/20/21 1208 03/20/21 1517        Assessment/Plan Mr. Ozment is a 60 year old male who underwent difficult laparoscopic conversion of laparoscopic adjustable gastric band to gastric bypass on 02/18/2021.  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 This was unable to be traversed with an endoscope, so he underwent laparoscopic remnant gastrostomy tube placement 03/20/2021.  He is only able to tolerate sips of liquids by mouth, but hopefully with time and healing this stricture improves or is able to be intervened on endoscopically.   -No signs of refeeding syndrome. Phos elevated yesterday -Transitioned to q2h bolus feeds yesterday - will discuss with dietician and TOC to make sure this is set up and we have appropriate regimen - enteral PPI  -  Melatonin qhs for sleep, benadryl prn   Moderate Malnutrition - related to acute illness as evidenced by percent weight loss, energy intake < or equal to 75% for > or equal to 1 month. Diabetes type 2 - SSI A. Fib - continue home antiarrhythmics, not on anticoagulation Diastolic dysfunction, CAD, GERD, HLD, HTN - continue home meds OSA - CPAP at night    LOS: 7 days    FEN: TF 50 cc q2h VTE: SCDs, Lovenox Dispo: Hopefully discharge today   LOS: 8 days    Juliet Rude, [facility] 03/25/2021, 10:13 AM Please see Amion for pager number during day hours 7:00am-4:30pm

## 2021-03-25 NOTE — Discharge Summary (Signed)
Patient ID: Hector Curry [id number] y.o. 11/13/1960  03/17/2021  Discharge date and time: 03/25/2021  Admitting Physician: Dr. 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   Morbid obesity (HCC) 02/18/2021   History of kidney stones 05/30/2016   Elevated LFTs 02/13/2016   Morbid obesity with BMI of 40.0-44.9, adult (HCC) 12/20/2015   Atrial tachycardia (HCC) 06/21/2015   Hypertriglyceridemia 05/15/2015   Coronary artery disease, non-occlusive    Type II diabetes mellitus, uncontrolled (HCC) 05/08/2015   Chest pain at rest    Hyperglycemia 05/07/2015   Chest pain 05/06/2015   GERD (gastroesophageal reflux disease)    Kidney stones    Hemorrhoids    Inflammatory arthritis    Family history of premature CAD    OSA (obstructive sleep apnea)    History of tobacco abuse    Palpitations    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/20/2014   Diverticulosis 10/03/2014   Hyperplastic colon polyp 10/03/2014   HTN, goal below 140/90 08/07/2014   Pain in the chest 06/19/2014   SOB (shortness of breath) 06/19/2014   Obesity 06/19/2014   Hyperlipidemia 06/19/2014     Discharge Diagnoses: Dehydration Patient Active Problem List   Diagnosis Date Noted   Malnutrition of moderate degree 03/22/2021   Dehydration 03/17/2021   Gastric outlet obstruction 03/09/2021   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   Morbid obesity (HCC) 02/18/2021   History of kidney stones 05/30/2016   Elevated LFTs 02/13/2016   Morbid obesity with BMI of 40.0-44.9, adult (HCC) 12/20/2015   Atrial tachycardia (HCC) 06/21/2015   Hypertriglyceridemia 05/15/2015   Coronary artery disease, non-occlusive    Type II diabetes mellitus,  uncontrolled (HCC) 05/08/2015   Chest pain at rest    Hyperglycemia 05/07/2015   Chest pain 05/06/2015   GERD (gastroesophageal reflux disease)    Kidney stones    Hemorrhoids    Inflammatory arthritis    Family history of premature CAD    OSA (obstructive sleep apnea)    History of tobacco abuse    Palpitations    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 This was unable to be traversed with an endoscope, so he underwent laparoscopic remnant gastrostomy tube placement 03/20/2021.  He is only able to tolerate sips of liquids by mouth, but hopefully with time and healing this stricture improves or is able to be intervened on endoscopically.  He was discharged on 03/25/21.  Consults: None  Significant Diagnostic Studies: None  Treatments: surgery: as above  Disposition: Home  Patient Instructions:  Allergies as of 03/25/2021       Reactions   Other Other (See Comments)   Cats        Medication List  TAKE these medications    acetaminophen 500 MG tablet Commonly known as: TYLENOL Take 1,000 mg by mouth every 6 (six) hours as needed for moderate pain.   aspirin EC 81 MG tablet Take 81 mg by mouth daily. Swallow whole.   bisoprolol-hydrochlorothiazide 10-6.25 MG tablet Commonly known as: ZIAC TAKE 1 TABLET BY MOUTH ONCE DAILY    cetirizine 10 MG tablet Commonly known as: ZYRTEC Take 10 mg by mouth daily.   cholecalciferol 1000 units tablet Commonly known as: VITAMIN D Take 1,000 Units by mouth daily.   docusate sodium 100 MG capsule Commonly known as: COLACE Take 100 mg by mouth daily as needed for mild constipation.   fenofibrate 145 MG tablet Commonly known as: TRICOR Take 145 mg by mouth daily.   Flaxseed Oil 1200 MG Caps Take 1,200 mg by mouth daily.   fluticasone 50 MCG/ACT nasal spray Commonly known as: FLONASE Place 1 spray into both nostrils daily as needed for allergies or rhinitis.   magnesium gluconate 500 MG tablet Commonly known as: MAGONATE Take 500 mg by mouth daily.   metFORMIN 850 MG tablet Commonly known as: GLUCOPHAGE Take 1 tablet (850 mg total) by mouth 2 (two) times daily.   ondansetron 4 MG disintegrating tablet Commonly known as: ZOFRAN-ODT Dissolve1 tablet (4 mg total) by mouth every 6 (six) hours as needed for nausea.   OVER THE COUNTER MEDICATION Place 1 application into both ears daily as needed (for irritation in ears). Mometasone flroate topical solution 0.1 % once per month   oxyCODONE-acetaminophen 5-325 MG tablet Commonly known as: Percocet Take 1 tablet by mouth every 4 (four) hours as needed for severe pain.   pantoprazole 40 MG tablet Commonly known as: Protonix Take 1 tablet (40 mg total) by mouth daily.   pioglitazone 30 MG tablet Commonly known as: ACTOS Take 30 mg by mouth daily.   QUERCETIN PO Take 500 mg by mouth daily at 8 pm.   Sodium Fluoride 5000 Sensitive 1.1-5 % Gel Generic drug: Sod Fluoride-Potassium Nitrate 1 application by Mouth Rinse route 2 (two) times daily.   SYSTANE BALANCE OP Place 1 drop into both eyes daily as needed (dry eyes).   Turmeric 500 MG Caps Take 1,000 mg by mouth daily.   zinc gluconate 50 MG tablet Take 50 mg by mouth daily.       ASK your doctor about these medications    diltiazem 60 MG  tablet Commonly known as: CARDIZEM Take 1 tablet (60 mg total) by mouth every 8 (eight) hours.   ondansetron 4 MG disintegrating tablet Commonly known as: ZOFRAN-ODT Take 1 tablet (4 mg total) by mouth every 6 (six) hours as needed for nausea or vomiting.   potassium chloride SA 20 MEQ tablet Commonly known as: KLOR-CON TAKE 1 TABLET BY MOUTH TWO TIMES DAILY   propranolol 20 MG tablet Commonly known as: INDERAL TAKE 1 TABLET BY MOUTH 3  TIMES DAILY AS NEEDED        Activity: no lifting, driving, or strenuous exercise for 4 Diet:  Tube feeds as instructed in hospital Wound Care: keep wound clean and dry  Follow-up:  With Dr. Sheliah Hatch in 4 weeks.  Signed: Hyman Hopes [facility], [place]   03/25/2021, 7:53 AM

## 2021-03-25 NOTE — Progress Notes (Signed)
Nutrition Note  Spoke with pt and pt's wife at bedside. They both report that the tube feeds are going well and he is tolerating 50 ml of Osmolite 1.2. Had 4 feeding yesterday. Pt to increase as tolerated to goal.  Recommendations: -Continue to increase as tolerated to 100 ml Osmolite 1.2 every 2 hours (~7 feeds daily). -45 ml Prosource TF BID -Free water flush with 30 ml before and after each feed (420 ml) -Need additional 240 ml TID via G-tube or PO (720 ml) -Provides 935 kcals (66% of needs), 61g protein (77% of needs), 112g CHO and 1005 ml H2O.  -Resume protein supplements once stricture improves -if unable, increase Prosource to QID -Resume daily bariatric MVI and calcium supplements via G-tube  Patient discharging today.  Tilda Franco, MS, RD, LDN Inpatient Clinical Dietitian Contact information available via Amion

## 2021-03-25 NOTE — Plan of Care (Signed)
Instructions were reviewed with patient. All questions were answered. Patient was transported to main entrance by wheelchair. ° °

## 2021-03-25 NOTE — TOC Transition Note (Signed)
Transition of Care [facility]) - CM/SW Discharge Note   Patient Details  Name: Hector Curry MRN: [id number] Date of Birth: 16-Jun-1961  Transition of Care [facility]) CM/SW Contact:  Lanier Clam, RN Phone Number: 03/25/2021, 10:27 AM   Clinical Narrative: d/c home w/[facility]- rep Pearson Grippe following & aware of /dc today home w/[facility]N;[facility] rep Ian Malkin following & aware of dme TF-he will follow with Tresa Endo PA if additonal ino needed after order is processed-Zach will also contact Dawn spouse with any additional info needed. No further CM need.      Final next level of care: Home w Home Health Services Barriers to Discharge: No Barriers Identified   Patient Goals and CMS Choice Patient states their goals for this hospitalization and ongoing recovery are:: go home CMS Medicare.gov Compare Post Acute Care list provided to:: Patient Represenative (must comment) (dtr Gloria/grand dtr Jazmin) Choice offered to / list presented to : Spouse, Adult Children  Discharge Placement                       Discharge Plan and Services In-house Referral: Clinical Social Work Discharge Planning Services: CM Consult Post Acute Care Choice: Home Health          DME Arranged: Tube feeding DME Agency: [facility] Date DME Agency Contacted: 03/25/21 Time DME Agency Contacted: 1026 Representative spoke with at DME Agency: Ian Malkin HH Arranged: RN, TPN HH Agency: [facility] Date HH Agency Contacted: 03/25/21 Time HH Agency Contacted: 1025 Representative spoke with at [facility] Agency: [facility]-rep Pearson Grippe  Social Determinants of Health (SDOH) Interventions     Readmission Risk Interventions No flowsheet data found.

## 2021-03-26 ENCOUNTER — Other Ambulatory Visit: Payer: Self-pay

## 2021-03-26 ENCOUNTER — Other Ambulatory Visit: Payer: Self-pay | Admitting: *Deleted

## 2021-03-26 ENCOUNTER — Emergency Department (HOSPITAL_COMMUNITY): Payer: No Typology Code available for payment source

## 2021-03-26 ENCOUNTER — Inpatient Hospital Stay (HOSPITAL_COMMUNITY)
Admission: EM | Admit: 2021-03-26 | Discharge: 2021-03-29 | DRG: 395 | Disposition: A | Discharge status: Home / Self Care | Payer: No Typology Code available for payment source | Attending: General Surgery | Admitting: General Surgery

## 2021-03-26 ENCOUNTER — Encounter (HOSPITAL_COMMUNITY): Payer: Self-pay | Admitting: *Deleted

## 2021-03-26 DIAGNOSIS — Y848 Other medical procedures as the cause of abnormal reaction of the patient, or of later complication, without mention of misadventure at the time of the procedure: Secondary | ICD-10-CM | POA: Diagnosis present

## 2021-03-26 DIAGNOSIS — G4733 Obstructive sleep apnea (adult) (pediatric): Secondary | ICD-10-CM | POA: Diagnosis present

## 2021-03-26 DIAGNOSIS — I4891 Unspecified atrial fibrillation: Secondary | ICD-10-CM | POA: Diagnosis present

## 2021-03-26 DIAGNOSIS — E119 Type 2 diabetes mellitus without complications: Secondary | ICD-10-CM | POA: Diagnosis present

## 2021-03-26 DIAGNOSIS — K219 Gastro-esophageal reflux disease without esophagitis: Secondary | ICD-10-CM | POA: Diagnosis present

## 2021-03-26 DIAGNOSIS — Z8249 Family history of ischemic heart disease and other diseases of the circulatory system: Secondary | ICD-10-CM

## 2021-03-26 DIAGNOSIS — Z20822 Contact with and (suspected) exposure to covid-19: Secondary | ICD-10-CM | POA: Diagnosis present

## 2021-03-26 DIAGNOSIS — I2510 Atherosclerotic heart disease of native coronary artery without angina pectoris: Secondary | ICD-10-CM | POA: Diagnosis present

## 2021-03-26 DIAGNOSIS — Z87442 Personal history of urinary calculi: Secondary | ICD-10-CM | POA: Diagnosis not present

## 2021-03-26 DIAGNOSIS — R52 Pain, unspecified: Secondary | ICD-10-CM

## 2021-03-26 DIAGNOSIS — K9432 Esophagostomy infection: Secondary | ICD-10-CM | POA: Diagnosis not present

## 2021-03-26 DIAGNOSIS — Z9884 Bariatric surgery status: Secondary | ICD-10-CM | POA: Diagnosis not present

## 2021-03-26 DIAGNOSIS — K9423 Gastrostomy malfunction: Principal | ICD-10-CM | POA: Diagnosis present

## 2021-03-26 DIAGNOSIS — E785 Hyperlipidemia, unspecified: Secondary | ICD-10-CM | POA: Diagnosis present

## 2021-03-26 DIAGNOSIS — Z833 Family history of diabetes mellitus: Secondary | ICD-10-CM

## 2021-03-26 LAB — CBC WITH DIFFERENTIAL/PLATELET
Abs Immature Granulocytes: 0.14 10*3/uL — ABNORMAL HIGH (ref 0.00–0.07)
Basophils Absolute: 0.1 10*3/uL (ref 0.0–0.1)
Basophils Relative: 1 %
Eosinophils Absolute: 0.2 10*3/uL (ref 0.0–0.5)
Eosinophils Relative: 2 %
HCT: 48.4 % (ref 39.0–52.0)
Hemoglobin: 15.7 g/dL (ref 13.0–17.0)
Immature Granulocytes: 1 %
Lymphocytes Relative: 17 %
Lymphs Abs: 1.7 10*3/uL (ref 0.7–4.0)
MCH: 29.1 pg (ref 26.0–34.0)
MCHC: 32.4 g/dL (ref 30.0–36.0)
MCV: 89.8 fL (ref 80.0–100.0)
Monocytes Absolute: 0.8 10*3/uL (ref 0.1–1.0)
Monocytes Relative: 8 %
Neutro Abs: 7.2 10*3/uL (ref 1.7–7.7)
Neutrophils Relative %: 71 %
Platelets: 242 10*3/uL (ref 150–400)
RBC: 5.39 MIL/uL (ref 4.22–5.81)
RDW: 15.2 % (ref 11.5–15.5)
WBC: 10 10*3/uL (ref 4.0–10.5)
nRBC: 0 % (ref 0.0–0.2)

## 2021-03-26 LAB — BASIC METABOLIC PANEL
Anion gap: 10 (ref 5–15)
BUN: 10 mg/dL (ref 6–20)
CO2: 27 mmol/L (ref 22–32)
Calcium: 9 mg/dL (ref 8.9–10.3)
Chloride: 98 mmol/L (ref 98–111)
Creatinine, Ser: 0.74 mg/dL (ref 0.61–1.24)
GFR, Estimated: >60 mL/min (ref >60)
Glucose, Bld: 148 mg/dL — ABNORMAL HIGH (ref 70–99)
Potassium: 3.7 mmol/L (ref 3.5–5.1)
Sodium: 135 mmol/L (ref 135–145)

## 2021-03-26 MED ORDER — SODIUM CHLORIDE 0.9 % IV SOLN: INTRAVENOUS | Status: AC

## 2021-03-26 MED ORDER — ONDANSETRON HCL 4 MG/2ML IJ SOLN
4.0000 mg | Freq: Once | INTRAMUSCULAR | Status: AC
  Administered 2021-03-26: 4 mg via INTRAVENOUS
  Filled 2021-03-26: qty 2

## 2021-03-26 NOTE — Patient Outreach (Signed)
[facility]) Care Management  03/26/2021  Hector Curry Oct 10, 1960 [id number]   Transition of care telephone call  Referral received:81/22 Initial outreach:03/26/21 Insurance: [facility]  Initial unsuccessful telephone call to patient's preferred number in order to complete transition of care assessment; no answer, left HIPAA compliant voicemail message requesting return call.   Objective: Per the electronic medical record, Hector Curry  was hospitalized at [facility] for dehydration, stricture near GJ anastomosis.  Comorbidities include:  Laparoscopic gastric band conversion to gastric bypass on 6/27. He was discharged to home on 03/25/10 with [facility]  RN and durable medical equipment of tube feeding supplies per discharge summary.   Plan: This RNCM will route unsuccessful outreach letter with [facility] Care Management pamphlet and 24 hour Nurse Advice Line Magnet to [employer] Care Management clinical pool to be mailed to patient's home address. This RNCM will attempt another outreach within 4 business days.    Egbert Garibaldi, RN, BSN  [facility] Care Management,Care Management Coordinator  [phone]- Mobile [phone]- Toll Free Main Office

## 2021-03-26 NOTE — ED Provider Notes (Signed)
[facility] EMERGENCY DEPARTMENT Provider Note   CSN: [id number] Arrival date & time: 03/26/21  1611     History Chief Complaint  Patient presents with   feeding tube blocked    Hector Curry is a 60 y.o. male with past medical history significant for A. fib, CAD, diabetes, recent bypass surgery with [facility] surgery, Dr. Sheliah Hatch who presents for evaluation of feeding tube complication.  Apparently had hospitalizations due to intractable emesis since surgery.  Had G-tube placed approximately 9 days ago.  Subsequently discharged home yesterday.  Today morning feed this morning which not have any significant complications.  Apparently went to do a lunch feed and was not able to flush the tube.  Home health came out tried coke x2 as well as flushing without significant improvement.  Came here for further evaluation.  Patient states he is not able to tolerate any p.o. intake since his prior surgery.  He denies fever, chills chest pain, abdominal pain, redness, swelling to his G-tube site.  Denies additional aggravating relieving factors.   History obtained from patient and past medical records. No interpretor was used.   HPI     Past Medical History:  Diagnosis Date   Allergic rhinitis    Atrial fibrillation (HCC)    Coronary artery disease, non-occlusive    a. 05/08/2015 Cath: no significant CAD, LVEF nl-->Med; b. 07/2017 MV: attenuation artifact, no ischemia, EF 65%-->Low risk.   Diabetes mellitus without complication (HCC)    Diastolic dysfunction    a. 04/2015 Echo: EF 60-65%, mild MR, mildly dil LA, nl RV fxn, nl PASP; b. 05/2016 Echo: EF 60-65%, no rmwa, Gr1 DD, mildly dil Ao root and asc Ao; c. 07/2017 Echo: EF 55-60%, Ao root 3mm. mildly dil LA; d. 03/2019 Echo: EF 55-60%, nl RV fxn. Mild LAE, Ao root/Asc Ao 80mm.   Dilated aortic root (HCC)    a. 07/2017 Echo: 67mm Ao root - mildly dil; b. 03/2019 Echo: Ao root 69mm.   Diverticulosis 10/03/2014   Dysrhythmia     Family history of premature CAD    a. father passed from MI at 28   GERD (gastroesophageal reflux disease)    Hemorrhoids    a. internal hemorrhoids s/p surgery 1999   History of kidney stones    History of tobacco abuse    Hyperlipidemia    Hyperplastic colon polyp 10/03/2014   a. x 2    Hypertension    Inflammatory arthritis    a. CCP antibodies & x-rays negative. Rheumatoid factor 14, felt to be crystaline over RA or psoriatic   Morbid obesity (HCC)    a. s/p LAP-BAND   OSA (obstructive sleep apnea)    a. on CPAP   Osteoarthritis    PSVT (paroxysmal supraventricular tachycardia) (HCC)    a. 48 hr Holter 04/2015: NSR w/ rare PVC, short runs of narrow complex tachycardiac, possible atrial tach, longest run 7 beats, PACs noted (2% of all beats 3600 total) they did not seem to correlate w/ significant arrythmia; b. 08/2017 Event monitor: no significant arrhythmias.    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   Morbid obesity (HCC) 02/18/2021   History of kidney stones 05/30/2016   Elevated LFTs 02/13/2016   Morbid obesity with BMI of 40.0-44.9, adult (HCC) 12/20/2015  Atrial tachycardia (HCC) 06/21/2015   Hypertriglyceridemia 05/15/2015   Coronary artery disease, non-occlusive    Type II diabetes mellitus, uncontrolled (HCC) 05/08/2015   Chest pain at rest    Hyperglycemia 05/07/2015   Chest pain 05/06/2015   GERD (gastroesophageal reflux disease)    Kidney stones    Hemorrhoids    Inflammatory arthritis    Family history of premature CAD    OSA (obstructive sleep apnea)    History of tobacco abuse    Palpitations    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/20/2014   Diverticulosis 10/03/2014   Hyperplastic colon polyp 10/03/2014   HTN, goal below 140/90  08/07/2014   Pain in the chest 06/19/2014   SOB (shortness of breath) 06/19/2014   Obesity 06/19/2014   Hyperlipidemia 06/19/2014    Past Surgical History:  Procedure Laterality Date   BARIATRIC SURGERY     lap band    CARDIAC CATHETERIZATION N/A 05/08/2015   Procedure: Left Heart Cath and Coronary Angiography;  Surgeon: Corky Crafts, MD;  Location: [facility] INVASIVE CV LAB;  Service: Cardiovascular;  Laterality: N/A;   CARPAL TUNNEL RELEASE Left    CHOLECYSTECTOMY     COLONOSCOPY  06/27/2005   COLONOSCOPY  10/03/2014   ESOPHAGOGASTRODUODENOSCOPY  06/27/2005   ESOPHAGOGASTRODUODENOSCOPY N/A 03/19/2021   Procedure: ESOPHAGOGASTRODUODENOSCOPY (EGD);  Surgeon: Kinsinger, De Blanch, MD;  Location: [facility];  Service: General;  Laterality: N/A;   GASTRIC ROUX-EN-Y N/A 02/18/2021   Procedure: LAPAROSCOPIC ROUX-EN-Y GASTRIC BYPASS WITH UPPER ENDOSCOPY,;  Surgeon: Sheliah Hatch De Blanch, MD;  Location: [facility] ORS;  Service: General;  Laterality: N/A;   HEMORRHOID SURGERY  08/25/1997   LAPAROSCOPIC INSERTION GASTROSTOMY TUBE N/A 03/20/2021   Procedure: LAPAROSCOPIC INSERTION GASTROSTOMY TUBE;  Surgeon: Rodman Pickle, MD;  Location: [facility] ORS;  Service: General;  Laterality: N/A;   MOUTH SURGERY     removed area which was benign   TONSILLECTOMY     UPPER GI ENDOSCOPY N/A 03/20/2021   Procedure: UPPER GI ENDOSCOPY;  Surgeon: Sheliah Hatch, De Blanch, MD;  Location: [facility] ORS;  Service: General;  Laterality: N/A;       Family History  Problem Relation Age of Onset   Hypertension Mother    Diabetes Mother    Heart attack Father 57   CAD Father    Hypertension Sister    Diabetes Other    Hypertension Other    Heart disease Other     Social History   Tobacco Use   Smoking status: Former    Packs/day: 1.00    Years: 15.00    Pack years: 15.00    Types: Cigarettes    Quit date: 08/05/1989    Years since quitting: 31.6   Smokeless tobacco: Never  Vaping Use   Vaping Use: Never  used  Substance Use Topics   Alcohol use: Never   Drug use: Never    Home Medications Prior to Admission medications   Medication Sig Start Date End Date Taking? Authorizing Provider  acetaminophen (TYLENOL) 500 MG tablet Take 1,000 mg by mouth every 6 (six) hours as needed for moderate pain.   Yes [provider]  bisoprolol-hydrochlorothiazide (ZIAC) 10-6.25 MG tablet TAKE 1 TABLET BY MOUTH ONCE DAILY Patient taking differently: Place 1 tablet into feeding tube daily. 11/13/20 11/13/21 Yes Marguarite Arbour, MD  cetirizine (ZYRTEC) 10 MG tablet Take 10 mg by mouth daily.   Yes [provider]  diltiazem (CARDIZEM) 60 MG tablet Take 1 tablet (60 mg total) by mouth every 8 (  eight) hours. Patient taking differently: Place 60 mg into feeding tube daily as needed (for high blood pressure). 12/07/20 12/07/21 Yes Creig HinesBerge, Christopher Ronald, NP  Nutritional Supplements (FEEDING SUPPLEMENT, OSMOLITE 1.2 CAL,) LIQD Place 1,000 mLs into feeding tube continuous. -Begin with 50 ml Osmolite 1.2 via G-tube every 2 hours (8 feedings total while awake) -Free water flush with 30 ml before and after each bolus As tolerated work up to 100 ml Osmolite 1.2 7 times daily (~every 2 hours). -Continue Prosource TF BID. -3 cartons total of Osmolite 1.2 + Prosource TF BID provides 935 kcals (66% of needs), 61g protein (77% of needs), 112g CHO and 1005 ml H2O. -Free water of 240 ml TID via G-tube or PO -Will meet rest of needs with other liquids/diet and protein supplements 03/25/21  Yes Stechschulte, Hyman HopesPaul J, MD  oxyCODONE-acetaminophen (PERCOCET) 5-325 MG tablet Take 1 tablet by mouth every 4 (four) hours as needed for severe pain. 03/25/21 03/25/22 Yes Stechschulte, Hyman HopesPaul J, MD  pantoprazole (PROTONIX) 40 MG tablet Take 1 tablet (40 mg total) by mouth daily. Patient taking differently: 40 mg daily. Feeding Tube 03/09/21 03/09/22 Yes Cornett, Maisie Fushomas, MD  propranolol (INDERAL) 20 MG tablet TAKE 1 TABLET BY MOUTH 3   TIMES DAILY AS NEEDED Patient taking differently: Take 20 mg by mouth daily as needed (for high blood pressure). 06/02/17  Yes Gollan, Tollie Pizzaimothy J, MD  Propylene Glycol (SYSTANE BALANCE OP) Place 1 drop into both eyes daily as needed (dry eyes).   Yes [provider]  aspirin EC 81 MG tablet Take 81 mg by mouth daily. Swallow whole. Patient not taking: Reported on 03/26/2021    [provider]  cholecalciferol (VITAMIN D) 1000 UNITS tablet Take 1,000 Units by mouth daily.  Patient not taking: Reported on 03/26/2021    [provider]  docusate sodium (COLACE) 100 MG capsule Take 100 mg by mouth daily as needed for mild constipation. Patient not taking: Reported on 03/26/2021    [provider]  fenofibrate (TRICOR) 145 MG tablet Take 145 mg by mouth daily.  Patient not taking: Reported on 03/26/2021 01/09/17   [provider]  Flaxseed, Linseed, (FLAXSEED OIL) 1200 MG CAPS Take 1,200 mg by mouth daily. Patient not taking: Reported on 03/26/2021    [provider]  fluticasone (FLONASE) 50 MCG/ACT nasal spray Place 1 spray into both nostrils daily as needed for allergies or rhinitis. Patient not taking: Reported on 03/26/2021    [provider]  magnesium gluconate (MAGONATE) 500 MG tablet Take 500 mg by mouth daily. Patient not taking: Reported on 03/26/2021    [provider]  metFORMIN (GLUCOPHAGE) 850 MG tablet Take 1 tablet (850 mg total) by mouth 2 (two) times daily. Patient not taking: Reported on 03/26/2021 06/13/20     Nutritional Supplements (FEEDING SUPPLEMENT, PROSOURCE TF,) liquid Place 45 mLs into feeding tube 2 (two) times daily. -Begin with 50 ml Osmolite 1.2 via G-tube every 2 hours (8 feedings total while awake) -Free water flush with 30 ml before and after each bolus As tolerated work up to 100 ml Osmolite 1.2 7 times daily (~every 2 hours). -Continue Prosource TF BID. -3 cartons total of Osmolite 1.2 + Prosource TF BID  provides 935 kcals (66% of needs), 61g protein (77% of needs), 112g CHO and 1005 ml H2O. -Free water of 240 ml TID via G-tube or PO -Will meet rest of needs with other liquids/diet and protein supplements Patient not taking: No sig reported 03/25/21  Stechschulte, Hyman Hopes, MD  ondansetron (ZOFRAN-ODT) 4 MG disintegrating tablet Take 1 tablet (4 mg total) by mouth every 6 (six) hours as needed for nausea or vomiting. Patient not taking: Reported on 03/17/2021 02/20/21   Kinsinger, De Blanch, MD  ondansetron (ZOFRAN-ODT) 4 MG disintegrating tablet Dissolve1 tablet (4 mg total) by mouth every 6 (six) hours as needed for nausea. Patient not taking: Reported on 03/26/2021 03/09/21   Harriette Bouillon, MD  OVER THE COUNTER MEDICATION Place 1 application into both ears daily as needed (for irritation in ears). Mometasone flroate topical solution 0.1 % once per month Patient not taking: Reported on 03/26/2021    [provider]  pantoprazole (PROTONIX) 40 MG tablet Take 1 tablet by mouth 2 times daily. Patient not taking: No sig reported 03/25/21 03/25/22  Stechschulte, Hyman Hopes, MD  pioglitazone (ACTOS) 30 MG tablet Take 30 mg by mouth daily. Patient not taking: Reported on 03/26/2021    [provider]  potassium chloride SA (KLOR-CON) 20 MEQ tablet TAKE 1 TABLET BY MOUTH TWO TIMES DAILY Patient not taking: Reported on 03/26/2021 03/28/20 04/14/21  Antonieta Iba, MD  QUERCETIN PO Take 500 mg by mouth daily at 8 pm. Patient not taking: Reported on 03/26/2021    [provider]  SODIUM FLUORIDE 5000 SENSITIVE 1.1-5 % GEL 1 application by Mouth Rinse route 2 (two) times daily. Patient not taking: Reported on 03/26/2021 01/29/21   [provider]  Turmeric 500 MG CAPS Take 1,000 mg by mouth daily. Patient not taking: Reported on 03/26/2021    [provider]  zinc gluconate 50 MG tablet Take 50 mg by mouth daily. Patient not taking: Reported on 03/26/2021    [provider]     Allergies    Other  Review of Systems   Review of Systems  Constitutional: Negative.   HENT: Negative.    Respiratory: Negative.    Cardiovascular: Negative.   Gastrointestinal: Negative.   Genitourinary: Negative.   Musculoskeletal: Negative.   Skin: Negative.   Neurological: Negative.   All other systems reviewed and are negative.  Physical Exam Updated Vital Signs BP 133/88   Pulse 87   Temp 97.6 F (36.4 C) (Oral)   Resp 20   SpO2 95%   Physical Exam Vitals and nursing note reviewed.  Constitutional:      General: He is not in acute distress.    Appearance: He is well-developed. He is obese. He is not ill-appearing, toxic-appearing or diaphoretic.  HENT:     Head: Atraumatic.     Nose: Nose normal.     Mouth/Throat:     Mouth: Mucous membranes are moist.  Eyes:     Pupils: Pupils are equal, round, and reactive to light.  Cardiovascular:     Rate and Rhythm: Normal rate and regular rhythm.     Pulses: Normal pulses.     Heart sounds: Normal heart sounds.  Pulmonary:     Effort: Pulmonary effort is normal. No respiratory distress.     Breath sounds: Normal breath sounds.  Abdominal:     General: Bowel sounds are normal. There is no distension.     Palpations: Abdomen is soft.     Tenderness: There is no abdominal tenderness. There is no guarding or rebound.     Comments: G tube to left upper abdomen. No redness, warmth drainage  Musculoskeletal:        General: No swelling or tenderness. Normal range of motion.  Cervical back: Normal range of motion and neck supple.  Skin:    General: Skin is warm and dry.     Capillary Refill: Capillary refill takes less than 2 seconds.  Neurological:     General: No focal deficit present.     Mental Status: He is alert and oriented to person, place, and time.    ED Results / Procedures / Treatments   Labs (all labs ordered are listed, but only abnormal results are displayed) Labs Reviewed  CBC WITH  DIFFERENTIAL/PLATELET - Abnormal; Notable for the following components:      Result Value   Abs Immature Granulocytes 0.14 (*)    All other components within normal limits  BASIC METABOLIC PANEL - Abnormal; Notable for the following components:   Glucose, Bld 148 (*)    All other components within normal limits  RESP PANEL BY RT-PCR (FLU A&B, COVID) [id number]    EKG None  Radiology DG Abd 1 View  Result Date: 03/26/2021 CLINICAL DATA:  Abdominal pain and nausea with blocked peg tube. EXAM: ABDOMEN - 1 VIEW COMPARISON:  March 09, 2021 FINDINGS: The bowel gas pattern is normal. A percutaneous gastrostomy tube is seen with its distal tip and insufflator bulb overlying the expected region of the body of the stomach. Radiopaque contrast is noted throughout the lumen of the PEG tube. Ill-defined surgical sutures are seen overlying the mid right abdomen with radiopaque surgical clips overlying the right upper quadrant. No radio-opaque calculi or other significant radiographic abnormality are seen. IMPRESSION: Percutaneous gastrostomy tube, as described above, without evidence of bowel obstruction. Electronically Signed   By: Aram Candela M.D.   On: 03/26/2021 20:23    Procedures Procedures   Medications Ordered in ED Medications  0.9 %  sodium chloride infusion (has no administration in time range)   ED Course  I have reviewed the triage vital signs and the nursing notes.  Pertinent labs & imaging results that were available during my care of the patient were reviewed by me and considered in my medical decision making (see chart for details).  Here for evaluation of malfunctioning G-tube.  He is afebrile, nonseptic, not ill-appearing.  He does not take anything p.o.  Attempt to flush the G-tube here with saline, Coke as well as contrast dye for imaging without relief.  KUB shows G-tube placed.  No free air  Labs personally reviewed without significant abnormality  CONSULT with Dr.  Dossie Der with [facility]. Rec admission to [facility] to med-surg and will see in AM. [facility] to be the admitting team. I will place bed orders. Added surgery to team.  Will start some IVF given NPO status  Discussed with patient and family in room. Agreeable with admission to [facility].  The patient appears reasonably stabilized for admission considering the current resources, flow, and capabilities available in the ED at this time, and I doubt any other [facility] requiring further screening and/or treatment in the ED prior to admission.   Discussed with attending, Dr. Jeraldine Loots who agrees with above treatment, plan and disposition    MDM Rules/Calculators/A&P                           Final Clinical Impression(s) / ED Diagnoses Final diagnoses:  Pain  Malfunction of gastrostomy tube [facility])    Rx / DC Orders ED Discharge Orders     None        Lechelle Wrigley A, PA-C 03/26/21 2256  Gerhard Munch, MD 03/26/21 2322

## 2021-03-26 NOTE — ED Triage Notes (Signed)
Feeding tube inserted last week. States he was able to use it earlier today and now it is blocked. Recent gastric bypass

## 2021-03-27 ENCOUNTER — Inpatient Hospital Stay (HOSPITAL_COMMUNITY): Payer: No Typology Code available for payment source

## 2021-03-27 HISTORY — PX: IR GJ TUBE CHANGE: IMG1440

## 2021-03-27 LAB — CBC
HCT: 45.8 % (ref 39.0–52.0)
Hemoglobin: 14.7 g/dL (ref 13.0–17.0)
MCH: 28.8 pg (ref 26.0–34.0)
MCHC: 32.1 g/dL (ref 30.0–36.0)
MCV: 89.6 fL (ref 80.0–100.0)
Platelets: 231 10*3/uL (ref 150–400)
RBC: 5.11 MIL/uL (ref 4.22–5.81)
RDW: 15.1 % (ref 11.5–15.5)
WBC: 9.2 10*3/uL (ref 4.0–10.5)
nRBC: 0 % (ref 0.0–0.2)

## 2021-03-27 LAB — CREATININE, SERUM
Creatinine, Ser: 0.71 mg/dL (ref 0.61–1.24)
GFR, Estimated: >60 mL/min (ref >60)

## 2021-03-27 LAB — RESP PANEL BY RT-PCR (FLU A&B, COVID) ARPGX2
Influenza A by PCR: NEGATIVE
Influenza B by PCR: NEGATIVE
SARS Coronavirus 2 by RT PCR: NEGATIVE

## 2021-03-27 LAB — GLUCOSE, CAPILLARY: Glucose-Capillary: 146 mg/dL — ABNORMAL HIGH (ref 70–99)

## 2021-03-27 MED ORDER — ONDANSETRON HCL 4 MG/2ML IJ SOLN
4.0000 mg | Freq: Four times a day (QID) | INTRAMUSCULAR | Status: DC | PRN
  Administered 2021-03-27: 4 mg via INTRAVENOUS
  Filled 2021-03-27: qty 2

## 2021-03-27 MED ORDER — LIDOCAINE HCL 1 % IJ SOLN
INTRAMUSCULAR | Status: AC
  Filled 2021-03-27: qty 20

## 2021-03-27 MED ORDER — PANTOPRAZOLE SODIUM 40 MG PO PACK
40.0000 mg | PACK | Freq: Two times a day (BID) | ORAL | Status: DC
  Administered 2021-03-27 – 2021-03-29 (×4): 40 mg via ORAL
  Filled 2021-03-27 (×5): qty 20

## 2021-03-27 MED ORDER — ONDANSETRON 4 MG PO TBDP
4.0000 mg | ORAL_TABLET | Freq: Four times a day (QID) | ORAL | Status: DC | PRN
  Administered 2021-03-28: 4 mg via ORAL
  Filled 2021-03-27: qty 1

## 2021-03-27 MED ORDER — ONDANSETRON HCL 4 MG/2ML IJ SOLN
4.0000 mg | Freq: Four times a day (QID) | INTRAMUSCULAR | Status: DC | PRN
  Administered 2021-03-27 – 2021-03-29 (×4): 4 mg via INTRAVENOUS
  Filled 2021-03-27 (×5): qty 2

## 2021-03-27 MED ORDER — FREE WATER
60.0000 mL | Status: DC
  Administered 2021-03-27 – 2021-03-29 (×9): 60 mL

## 2021-03-27 MED ORDER — IOHEXOL 300 MG/ML  SOLN
50.0000 mL | Freq: Once | INTRAMUSCULAR | Status: AC | PRN
  Administered 2021-03-27: 10 mL

## 2021-03-27 MED ORDER — DOCUSATE SODIUM 50 MG/5ML PO LIQD
100.0000 mg | Freq: Two times a day (BID) | ORAL | Status: DC
  Administered 2021-03-27 – 2021-03-28 (×2): 100 mg
  Filled 2021-03-27 (×5): qty 10

## 2021-03-27 MED ORDER — HYDROCHLOROTHIAZIDE 10 MG/ML ORAL SUSPENSION
6.2500 mg | Freq: Every day | ORAL | Status: DC
  Administered 2021-03-28 – 2021-03-29 (×2): 6.25 mg
  Filled 2021-03-27 (×2): qty 1.25

## 2021-03-27 MED ORDER — DILTIAZEM 12 MG/ML ORAL SUSPENSION
60.0000 mg | Freq: Three times a day (TID) | ORAL | Status: DC
  Administered 2021-03-27 – 2021-03-28 (×2): 60 mg
  Filled 2021-03-27 (×4): qty 6

## 2021-03-27 MED ORDER — POTASSIUM CHLORIDE IN NACL 20-0.45 MEQ/L-% IV SOLN
INTRAVENOUS | Status: DC
  Filled 2021-03-27 (×4): qty 1000

## 2021-03-27 MED ORDER — METOPROLOL TARTRATE 25 MG/10 ML ORAL SUSPENSION
25.0000 mg | Freq: Two times a day (BID) | ORAL | Status: DC
  Administered 2021-03-27 – 2021-03-29 (×4): 25 mg
  Filled 2021-03-27 (×5): qty 10

## 2021-03-27 MED ORDER — METOPROLOL TARTRATE 25 MG/10 ML ORAL SUSPENSION: 10.0000 mg | Freq: Every day | ORAL | Status: DC

## 2021-03-27 MED ORDER — PANTOPRAZOLE SODIUM 40 MG IV SOLR: 40.0000 mg | Freq: Every day | INTRAVENOUS | Status: DC

## 2021-03-27 MED ORDER — ZOLPIDEM TARTRATE 5 MG PO TABS
5.0000 mg | ORAL_TABLET | Freq: Every evening | ORAL | Status: DC | PRN
  Administered 2021-03-28: 5 mg via ORAL
  Filled 2021-03-27: qty 1

## 2021-03-27 MED ORDER — INSULIN ASPART 100 UNIT/ML IJ SOLN
0.0000 [IU] | INTRAMUSCULAR | Status: DC
Start: 2021-03-27 — End: 2021-03-29
  Administered 2021-03-27: 3 [IU] via SUBCUTANEOUS
  Administered 2021-03-28: 4 [IU] via SUBCUTANEOUS
  Administered 2021-03-28 (×2): 3 [IU] via SUBCUTANEOUS
  Administered 2021-03-28: 4 [IU] via SUBCUTANEOUS
  Administered 2021-03-28 – 2021-03-29 (×5): 3 [IU] via SUBCUTANEOUS

## 2021-03-27 MED ORDER — PROPRANOLOL HCL 20 MG PO TABS
20.0000 mg | ORAL_TABLET | Freq: Three times a day (TID) | ORAL | Status: DC | PRN
  Filled 2021-03-27: qty 1

## 2021-03-27 MED ORDER — ENOXAPARIN SODIUM 40 MG/0.4ML IJ SOSY
40.0000 mg | PREFILLED_SYRINGE | INTRAMUSCULAR | Status: DC
  Administered 2021-03-28 – 2021-03-29 (×2): 40 mg via SUBCUTANEOUS
  Filled 2021-03-27 (×2): qty 0.4

## 2021-03-27 MED ORDER — PROSOURCE TF PO LIQD
45.0000 mL | Freq: Two times a day (BID) | ORAL | Status: DC
  Filled 2021-03-27 (×2): qty 45

## 2021-03-27 MED ORDER — LIDOCAINE VISCOUS HCL 2 % MT SOLN
OROMUCOSAL | Status: AC
  Filled 2021-03-27: qty 15

## 2021-03-27 MED ORDER — PROPRANOLOL HCL 20 MG PO TABS
20.0000 mg | ORAL_TABLET | Freq: Three times a day (TID) | ORAL | Status: DC
  Filled 2021-03-27: qty 1

## 2021-03-27 MED ORDER — OSMOLITE 1.2 CAL PO LIQD
50.0000 mL | ORAL | Status: DC
  Administered 2021-03-27 – 2021-03-29 (×9): 50 mL
  Filled 2021-03-27 (×17): qty 237

## 2021-03-27 MED ORDER — MORPHINE SULFATE (PF) 2 MG/ML IV SOLN: 2.0000 mg | INTRAVENOUS | Status: DC | PRN

## 2021-03-27 NOTE — Procedures (Signed)
Vascular and Interventional Radiology Procedure Note  Patient: Hector Curry DOB: 05/30/1961 Medical Record Number: [id number] Note Date/Time: 03/27/21 4:06 PM   Performing Physician: Roanna Banning, MD Assistant(s): None  Diagnosis: Catheter malfunction.  Procedure: GASTROSTOMY TUBE EXCHANGE  Anesthesia:  None Complications: None Estimated Blood Loss:  0 mL  Findings:  Successful exchange of a  34 F gastrostomy tube under fluoroscopy.   See detailed procedure note with images in PACS. The patient tolerated the procedure well without incident or complication and was returned to Floor Bed in stable condition.    Roanna Banning, MD [facility] Radiology   Pager. [phone] Clinic. [phone]

## 2021-03-27 NOTE — Progress Notes (Signed)
Progress Note     Subjective: Patient was discharge 8/1, feedings were initially going well at home. Patient took a nap yesterday AM and when he got up around noon, feeding tube was clogged. He's was unable to get unclogged with coke or warm water. He went to [facility] and was transferred here. He has been spitting up liquid.   Objective: Vital signs in last 24 hours: Temp:  [97.5 F (36.4 C)-97.9 F (36.6 C)] 97.5 F (36.4 C) (08/03 1348) Pulse Rate:  [62-87] 62 (08/03 1348) Resp:  [16-20] 16 (08/03 1348) BP: (133-156)/(78-93) 156/93 (08/03 1348) SpO2:  [93 %-96 %] 95 % (08/03 1348) Weight:  [132.6 kg] 132.6 kg (08/03 1039)    Intake/Output from previous day: No intake/output data recorded. Intake/Output this shift: No intake/output data recorded.  PE: General: pleasant, WD, obese male who is sitting up Heart: regular, rate, and rhythm. Lungs: Respiratory effort nonlabored Abd: soft, NT, mild distention, gastrostomy in place MS: all 4 extremities are symmetrical with no cyanosis, clubbing, or edema. Psych: A&Ox3 with an appropriate affect.   Lab Results:  Recent Labs    03/26/21 2140  WBC 10.0  HGB 15.7  HCT 48.4  PLT 242   BMET Recent Labs    03/26/21 2140  NA 135  K 3.7  CL 98  CO2 27  GLUCOSE 148*  BUN 10  CREATININE 0.74  CALCIUM 9.0   PT/INR No results for input(s): LABPROT, INR in the last 72 hours. CMP     Component Value Date/Time   NA 135 03/26/2021 2140   K 3.7 03/26/2021 2140   CL 98 03/26/2021 2140   CO2 27 03/26/2021 2140   GLUCOSE 148 (H) 03/26/2021 2140   BUN 10 03/26/2021 2140   CREATININE 0.74 03/26/2021 2140   CALCIUM 9.0 03/26/2021 2140   PROT 6.4 (L) 03/22/2021 0444   ALBUMIN 3.1 (L) 03/22/2021 0444   AST 15 03/22/2021 0444   ALT 15 03/22/2021 0444   ALKPHOS 51 03/22/2021 0444   BILITOT 0.6 03/22/2021 0444   GFRNONAA >60 03/26/2021 2140   GFRAA >60 05/22/2020 0827   Lipase     Component Value Date/Time   LIPASE  84 (H) 03/09/2021 0443       Studies/Results: DG Abd 1 View  Result Date: 03/26/2021 CLINICAL DATA:  Abdominal pain and nausea with blocked peg tube. EXAM: ABDOMEN - 1 VIEW COMPARISON:  March 09, 2021 FINDINGS: The bowel gas pattern is normal. A percutaneous gastrostomy tube is seen with its distal tip and insufflator bulb overlying the expected region of the body of the stomach. Radiopaque contrast is noted throughout the lumen of the PEG tube. Ill-defined surgical sutures are seen overlying the mid right abdomen with radiopaque surgical clips overlying the right upper quadrant. No radio-opaque calculi or other significant radiographic abnormality are seen. IMPRESSION: Percutaneous gastrostomy tube, as described above, without evidence of bowel obstruction. Electronically Signed   By: Aram Candela M.D.   On: 03/26/2021 20:23    Anti-infectives: Anti-infectives (From admission, onward)    None        Assessment/Plan Mr. Ybarra is a 60 year old male who underwent difficult laparoscopic conversion of laparoscopic adjustable gastric band to gastric bypass on 02/18/2021.  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 This was unable to be traversed with an endoscope, so he underwent laparoscopic remnant gastrostomy tube placement 03/20/2021.  He is only able to tolerate sips of liquids by mouth, but hopefully with time and healing  this stricture improves or is able to be intervened on endoscopically.   - gastric tube clogged, consulted IR to get unclogged vs replace - recommend to g tube to gravity once unclogged/replaced - can try restarting feeds tomorrow likely, recommend solution meds    LOS: 1 day    Juliet Rude, [facility] Surgery 03/27/2021, 3:02 PM Please see Amion for pager number during day hours 7:00am-4:30pm

## 2021-03-27 NOTE — Progress Notes (Signed)
Will ask IR to attempt to unclog g Tube.  If can't unclog, will ask them to exchange  G tube was stammed to abd wall with 4 transfascial sutures last week so even though 72 week old I think it can be exchanged by rads if unable to be unclogged  Mary Sella. Andrey Campanile, MD, FACS General, Bariatric, & Minimally Invasive Surgery [facility], [place]

## 2021-03-27 NOTE — ED Provider Notes (Signed)
Received call from Dr. Dossie Der, General Surgery who requests the patient be sent to the [facility] for more timely evaluation instead of waiting for an inpatient bed. Dr. Particia Nearing at the [facility] will accept. [facility] aware.    Pollyann Savoy, MD 03/27/21 [phone]

## 2021-03-28 LAB — CBC
HCT: 43.6 % (ref 39.0–52.0)
Hemoglobin: 14 g/dL (ref 13.0–17.0)
MCH: 28.8 pg (ref 26.0–34.0)
MCHC: 32.1 g/dL (ref 30.0–36.0)
MCV: 89.7 fL (ref 80.0–100.0)
Platelets: 215 10*3/uL (ref 150–400)
RBC: 4.86 MIL/uL (ref 4.22–5.81)
RDW: 15 % (ref 11.5–15.5)
WBC: 11.3 10*3/uL — ABNORMAL HIGH (ref 4.0–10.5)
nRBC: 0 % (ref 0.0–0.2)

## 2021-03-28 LAB — GLUCOSE, CAPILLARY
Glucose-Capillary: 122 mg/dL — ABNORMAL HIGH (ref 70–99)
Glucose-Capillary: 124 mg/dL — ABNORMAL HIGH (ref 70–99)
Glucose-Capillary: 142 mg/dL — ABNORMAL HIGH (ref 70–99)
Glucose-Capillary: 146 mg/dL — ABNORMAL HIGH (ref 70–99)
Glucose-Capillary: 153 mg/dL — ABNORMAL HIGH (ref 70–99)
Glucose-Capillary: 194 mg/dL — ABNORMAL HIGH (ref 70–99)

## 2021-03-28 LAB — BASIC METABOLIC PANEL
Anion gap: 9 (ref 5–15)
BUN: 9 mg/dL (ref 6–20)
CO2: 26 mmol/L (ref 22–32)
Calcium: 8.9 mg/dL (ref 8.9–10.3)
Chloride: 102 mmol/L (ref 98–111)
Creatinine, Ser: 0.58 mg/dL — ABNORMAL LOW (ref 0.61–1.24)
GFR, Estimated: >60 mL/min (ref >60)
Glucose, Bld: 131 mg/dL — ABNORMAL HIGH (ref 70–99)
Potassium: 3.4 mmol/L — ABNORMAL LOW (ref 3.5–5.1)
Sodium: 137 mmol/L (ref 135–145)

## 2021-03-28 MED ORDER — ADULT MULTIVITAMIN LIQUID CH
15.0000 mL | Freq: Every day | ORAL | Status: DC
  Administered 2021-03-28 – 2021-03-29 (×2): 15 mL
  Filled 2021-03-28 (×2): qty 15

## 2021-03-28 MED ORDER — RESOURCE INSTANT PROTEIN PO PWD PACKET
1.0000 | ORAL | Status: DC
  Administered 2021-03-28: 6 g
  Filled 2021-03-28 (×3): qty 6

## 2021-03-28 MED ORDER — RESOURCE INSTANT PROTEIN PO PWD PACKET
1.0000 | ORAL | Status: DC
  Administered 2021-03-28 – 2021-03-29 (×4): 6 g
  Filled 2021-03-28 (×9): qty 6

## 2021-03-28 MED ORDER — DILTIAZEM 12 MG/ML ORAL SUSPENSION
60.0000 mg | Freq: Three times a day (TID) | ORAL | Status: DC
  Administered 2021-03-28 – 2021-03-29 (×4): 60 mg
  Filled 2021-03-28 (×5): qty 6

## 2021-03-28 MED ORDER — ACETAMINOPHEN 160 MG/5ML PO SOLN: 650.0000 mg | Freq: Four times a day (QID) | ORAL | Status: DC | PRN

## 2021-03-28 NOTE — Progress Notes (Signed)
Progress Note     Subjective: G-tube replaced yesterday by IR.  Rough night due to vomiting secondary to stricture.  Vomiting mostly spit, but when he does vomiting some of the feeds come up.  Now that this has improved, he tolerates the feeds.    Objective: Vital signs in last 24 hours: Temp:  [97.5 F (36.4 C)-98.7 F (37.1 C)] 97.6 F (36.4 C) (08/04 0423) Pulse Rate:  [62-73] 73 (08/04 0423) Resp:  [16-18] 18 (08/04 0423) BP: (139-161)/(82-93) 145/91 (08/04 0423) SpO2:  [94 %-97 %] 95 % (08/04 0423) Weight:  [132.6 kg] 132.6 kg (08/03 1039)    Intake/Output from previous day: 08/03 0701 - 08/04 0700 In: 2316.8 [I.V.:2266.8; NG/GT:50] Out: 0  Intake/Output this shift: No intake/output data recorded.  PE: General: pleasant, WD, obese male  Heart: regular, rate, and rhythm. Lungs: Respiratory effort nonlabored Abd: soft, NT, obese, gastrostomy in place MS: all 4 extremities are symmetrical with no cyanosis, clubbing, or edema. Psych: A&Ox3 with an appropriate affect.   Lab Results:  Recent Labs    03/27/21 1522 03/28/21 0434  WBC 9.2 11.3*  HGB 14.7 14.0  HCT 45.8 43.6  PLT 231 215   BMET Recent Labs    03/26/21 2140 03/27/21 1522 03/28/21 0434  NA 135  --  137  K 3.7  --  3.4*  CL 98  --  102  CO2 27  --  26  GLUCOSE 148*  --  131*  BUN 10  --  9  CREATININE 0.74 0.71 0.58*  CALCIUM 9.0  --  8.9   PT/INR No results for input(s): LABPROT, INR in the last 72 hours. CMP     Component Value Date/Time   NA 137 03/28/2021 0434   K 3.4 (L) 03/28/2021 0434   CL 102 03/28/2021 0434   CO2 26 03/28/2021 0434   GLUCOSE 131 (H) 03/28/2021 0434   BUN 9 03/28/2021 0434   CREATININE 0.58 (L) 03/28/2021 0434   CALCIUM 8.9 03/28/2021 0434   PROT 6.4 (L) 03/22/2021 0444   ALBUMIN 3.1 (L) 03/22/2021 0444   AST 15 03/22/2021 0444   ALT 15 03/22/2021 0444   ALKPHOS 51 03/22/2021 0444   BILITOT 0.6 03/22/2021 0444   GFRNONAA >60 03/28/2021 0434   GFRAA  >60 05/22/2020 0827   Lipase     Component Value Date/Time   LIPASE 84 (H) 03/09/2021 0443       Studies/Results: DG Abd 1 View  Result Date: 03/26/2021 CLINICAL DATA:  Abdominal pain and nausea with blocked peg tube. EXAM: ABDOMEN - 1 VIEW COMPARISON:  March 09, 2021 FINDINGS: The bowel gas pattern is normal. A percutaneous gastrostomy tube is seen with its distal tip and insufflator bulb overlying the expected region of the body of the stomach. Radiopaque contrast is noted throughout the lumen of the PEG tube. Ill-defined surgical sutures are seen overlying the mid right abdomen with radiopaque surgical clips overlying the right upper quadrant. No radio-opaque calculi or other significant radiographic abnormality are seen. IMPRESSION: Percutaneous gastrostomy tube, as described above, without evidence of bowel obstruction. Electronically Signed   By: Aram Candela M.D.   On: 03/26/2021 20:23   IR GJ Tube Change  Result Date: 03/27/2021 INDICATION: 60 year old male male with clogged gastrostomy tube. EXAM: FLUOROSCOPIC GUIDED REPLACEMENT OF GASTROSTOMY TUBE COMPARISON:  KUB, 03/26/2021. MEDICATIONS: None. CONTRAST:  70mL OMNIPAQUE IOHEXOL 300 MG/ML SOLN - administered into the gastric lumen FLUOROSCOPY TIME:  0 minutes, 36 seconds.  Dose:  12 mGy. COMPLICATIONS: None immediate. PROCEDURE: Informed written consent was obtained from the patient after a discussion of the risks, benefits and alternatives to treatment. Questions regarding the procedure were encouraged and answered. A timeout was performed prior to the initiation of the procedure. The upper abdomen and external portion of the existing gastrostomy tube was prepped and draped in the usual sterile fashion, and a sterile drape was applied covering the operative field. Maximum barrier sterile technique with sterile gowns and gloves were used for the procedure. A timeout was performed prior to the initiation of the procedure. The existing  gastrostomy tube was clogged and therefore contrast injection confirming appropriate positioning within the gastric lumen could not be performed. Next, the gastrostomy tube was cannulated with a stiff Glidewire which was coiled within the gastric fundus. The retention balloon was deflated. Over the stiff guide wire, the existing gastrostomy tube was exchanged for a new 18-French balloon inflatable gastrostomy tube. The balloon was inflated with saline and dilute contrast and pulled against the anterior inner lumen of the stomach and the external disc was cinched. Contrast was injected and a post procedural spot fluoroscopic image was obtained confirming appropriate positioning and functionality of the new gastrostomy tube. A dressing was applied. The patient tolerated the procedure well without immediate postprocedural complication. IMPRESSION: Successful fluoroscopic guided replacement of a new 18-French gastrostomy tube. The gastrostomy tube is ready for immediate use. Roanna Banning, MD [facility] Radiology Electronically Signed   By: Roanna Banning MD   On: 03/27/2021 16:22    Anti-infectives: Anti-infectives (From admission, onward)    None        Assessment/Plan Hector Curry is a 60 year old male who underwent difficult laparoscopic conversion of laparoscopic adjustable gastric band to gastric bypass on 02/18/2021.  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 This was unable to be traversed with an endoscope, so he underwent laparoscopic remnant gastrostomy tube placement 03/20/2021. - gastric tube replaced by IR yesterday - dietitian consult to discuss if we can replace osmolite with bari shake, protein ensure max -diabetes coordinator to help adjust patient to SSI for home discharge -once we can get all of this arranged then we can plan for DC home -trying to adjust all meds to solutions as able to minimize risk of g-tube clogging.   LOS:  2 days    Letha Cape, [facility] Surgery 03/28/2021, 9:55 AM Please see Amion for pager number during day hours 7:00am-4:30pm

## 2021-03-28 NOTE — Progress Notes (Addendum)
Inpatient Diabetes Program Recommendations  AACE/ADA: New Consensus Statement on Inpatient Glycemic Control (2015)  Target Ranges:  Prepandial:   less than 140 mg/dL      Peak postprandial:   less than 180 mg/dL (1-2 hours)      Critically ill patients:  140 - 180 mg/dL   Lab Results  Component Value Date   GLUCAP 122 (H) 03/28/2021   HGBA1C 7.3 (H) 03/06/2021    Review of Glycemic Control Results for AIKEN, WITHEM (MRN [id number]) as of 03/28/2021 10:24  Ref. Range 03/27/2021 20:09 03/28/2021 00:16 03/28/2021 04:21 03/28/2021 08:31  Glucose-Capillary Latest Ref Range: 70 - 99 mg/dL 354 (H) 562 (H) 563 (H) 122 (H)   Diabetes history: DM2 Outpatient Diabetes medications:  prior to gastric bypass Actos 30 mg daily, metformin 850 mg bid Current orders for Inpatient glycemic control:  Novolog 0-20 units Q4 hours  Inpatient Diabetes Program Recommendations:    - Remain on Novolog 0-20 correction scale match frequency to bolus feeds.  Outpatient: If going home on tube feeds may need insulin Q4 hours while getting bolus feeds (want to avoid insulin stacking with Q2 hour coverage). If >200 for 2 days or more call PCP. Less than 70 call PCP.  Freestyle libre sensor order # [id number] (order 2 sensors) Novolog Flex pen [id number] Insulin pen needles order [phone]  Will follow trends  Thanks,  Christena Deem RN, MSN, BC-ADM Inpatient Diabetes Coordinator Team Pager [phone] (8a-5p)

## 2021-03-28 NOTE — Consult Note (Signed)
   [facility] Inpatient Consult   03/28/2021  Hector Curry [birth year] [id number]   [facility] [[facility]]  Accountable Care Organization [ACO] Patient:  Patient is currently active with [facility] Care Management for chronic disease management services.  Patient has been engaged by a [facility] Care Coordinator for the [facility] plan.   Our community based plan of care has focused on disease management and community resource support.  Patient was discussed in multidisciplinary case discussion regarding readmission and post hospital follow up needs.   Patient will receive a post hospital call and will be evaluated for assessments and disease process education.       Plan: Patient will be followed by [facility] RN Care Coordinator.   For additional questions or referrals please contact:   Charlesetta Shanks, RN BSN CCM [facility]  [phone] business mobile phone Toll free office [phone]  Fax number: [phone] [email or web address]

## 2021-03-28 NOTE — Progress Notes (Addendum)
Initial Nutrition Assessment  DOCUMENTATION CODES:   Non-severe (moderate) malnutrition in context of acute illness/injury, Obesity unspecified  INTERVENTION:   -Continue to administer 50 ml of Osmolite 1.2 8 times daily via G-tube in remnant stomach -Flush with 30 ml before and after each bolus -Provide Beneprotein powder mixed with 60 ml free water every 3 hours (8 times total), each provides 25 kcals and 6g protein -Additional free water flush of 240 ml TID via G-tube -Provides 680 kcals (48% of needs), 70g protein (87% of needs) and 1680 ml H2O  -MVI daily via tube   TF goal: -100 ml Osmolite 1.2 every 2 hours while awake(~7 feeds daily). -Free water flush with 30 ml before and after each bolus -Beneprotein powder mixed with 60 ml flushed every 3 hours (8 total) -Need additional 240 ml TID via G-tube (720 ml) -Provides 1040 kcals, 86g protein and 1714 ml H2O  NUTRITION DIAGNOSIS:   Moderate Malnutrition related to acute illness as evidenced by energy intake < or equal to 75% for > or equal to 1 month, percent weight loss.  GOAL:   Patient will meet greater than or equal to 90% of their needs  MONITOR:   Labs, Weight trends, TF tolerance, I & O's  REASON FOR ASSESSMENT:   Consult Diet education  ASSESSMENT:   60 y.o. male with past medical history significant for A. fib, CAD, diabetes, recent gastric bypass surgery with [place] surgery on 02/18/21, who presents for evaluation of feeding tube complication.  Apparently had hospitalizations due to intractable emesis since surgery.  Had G-tube placed approximately 9 days ago.  8/3: s/p GASTROSTOMY TUBE EXCHANGE  Spoke with surgery PA this morning regarding pt's TF regimen and if there is a way for pt use Ensure Max or protein shakes in tube. Per product instructions, shakes for oral use only and given thickness and pt's volume restrictions, diluting not ideal. Pt's tube was clogged d/t medications and vitamin  administration by wife. Wife was crushing them and flushing through tube.  Pt was unable to keep anything down at home after tube was clogged. Pt still with some emesis which is mainly spit. At one point, pt reports some of the tube feeding came back up but this was after vomiting several times.  Per PA, medications and vitamins to be transitioned to liquid forms going forward.  Will research liquid MVI and calcium for pt to use via tube.   Pt reports he does not tolerate Prosource supplements, states they cause him nausea. Will trial Beneprotein powder which can be flushed through tube if mixed with 60 ml free water per instructions.  Weight prior to RNY: 327 lbs. Current weight: 292 lbs. That is 10% wt loss x 1 month, which is significant for time frame.   Medications: Colace, Liquid MVI, IV Zofran  Labs reviewed:  CBGs: 122-153 Low K  NUTRITION - FOCUSED PHYSICAL EXAM:  Flowsheet Row Most Recent Value  Orbital Region No depletion  Upper Arm Region No depletion  Thoracic and Lumbar Region Unable to assess  Buccal Region Mild depletion  Temple Region Mild depletion  Clavicle Bone Region No depletion  Clavicle and Acromion Bone Region No depletion  Scapular Bone Region No depletion  Dorsal Hand No depletion  Patellar Region Unable to assess  Anterior Thigh Region Unable to assess  Posterior Calf Region Unable to assess  Edema (RD Assessment) None  Hair Reviewed  Eyes Reviewed  Skin Reviewed       Diet Order:  Diet Order             Diet NPO time specified Except for: Ice Chips  Diet effective now                   EDUCATION NEEDS:   Education needs have been addressed  Skin:  Skin Assessment: Skin Integrity Issues: Skin Integrity Issues:: Incisions Incisions: 7/27 abdomen  Last BM:  8/4  Height:   Ht Readings from Last 1 Encounters:  03/27/21 6\' 2"  (1.88 m)    Weight:   Wt Readings from Last 1 Encounters:  03/27/21 132.6 kg    BMI:  Body  mass index is 37.53 kg/m.  Estimated Nutritional Needs:   Kcal:  1400-1600  Protein:  80-100g  Fluid:  2L/day   05/27/21, MS, RD, LDN Inpatient Clinical Dietitian Contact information available via Amion

## 2021-03-29 ENCOUNTER — Other Ambulatory Visit (HOSPITAL_COMMUNITY): Payer: Self-pay

## 2021-03-29 ENCOUNTER — Other Ambulatory Visit: Payer: Self-pay | Admitting: *Deleted

## 2021-03-29 LAB — GLUCOSE, CAPILLARY
Glucose-Capillary: 122 mg/dL — ABNORMAL HIGH (ref 70–99)
Glucose-Capillary: 126 mg/dL — ABNORMAL HIGH (ref 70–99)
Glucose-Capillary: 132 mg/dL — ABNORMAL HIGH (ref 70–99)
Glucose-Capillary: 141 mg/dL — ABNORMAL HIGH (ref 70–99)
Glucose-Capillary: 146 mg/dL — ABNORMAL HIGH (ref 70–99)

## 2021-03-29 MED ORDER — METOPROLOL TARTRATE 25 MG/10 ML ORAL SUSPENSION
25.0000 mg | Freq: Two times a day (BID) | ORAL | 0 refills | Status: DC
  Filled 2021-03-29: qty 600, 30d supply, fill #0

## 2021-03-29 MED ORDER — FREESTYLE LIBRE 2 SENSOR MISC
1 refills | Status: DC
  Filled 2021-03-29: qty 2, 28d supply, fill #0
  Filled 2021-04-03: qty 1, 14d supply, fill #1
  Filled 2021-05-11: qty 1, 14d supply, fill #2
  Filled 2021-06-03: qty 2, 28d supply, fill #3
  Filled 2021-07-08: qty 2, 28d supply, fill #4
  Filled 2021-08-10: qty 2, 28d supply, fill #5
  Filled 2021-09-12: qty 2, 28d supply, fill #6
  Filled 2022-03-26: qty 2, 28d supply, fill #7

## 2021-03-29 MED ORDER — ZOLPIDEM TARTRATE 5 MG PO TABS
5.0000 mg | ORAL_TABLET | Freq: Every evening | ORAL | 0 refills | Status: DC | PRN
  Filled 2021-03-29: qty 5, 5d supply, fill #0

## 2021-03-29 MED ORDER — INSULIN LISPRO (1 UNIT DIAL) 100 UNIT/ML (KWIKPEN)
0.0000 [IU] | PEN_INJECTOR | Freq: Three times a day (TID) | SUBCUTANEOUS | 2 refills | Status: DC
  Filled 2021-03-29: qty 15, 25d supply, fill #0

## 2021-03-29 MED ORDER — INSULIN PEN NEEDLE 32G X 4 MM MISC
30.0000 | 3 refills | Status: DC | PRN
  Filled 2021-03-29: qty 100, 30d supply, fill #0
  Filled 2021-05-11: qty 100, 30d supply, fill #1
  Filled 2021-07-24: qty 100, 30d supply, fill #2

## 2021-03-29 MED ORDER — DOCUSATE SODIUM 50 MG/5ML PO LIQD
100.0000 mg | Freq: Two times a day (BID) | ORAL | 0 refills | Status: DC
  Filled 2021-03-29: qty 100, 5d supply, fill #0

## 2021-03-29 MED ORDER — IOHEXOL 9 MG/ML PO SOLN
ORAL | Status: AC
  Filled 2021-03-29: qty 1000

## 2021-03-29 MED ORDER — HYOSCYAMINE SULFATE 0.125 MG SL SUBL: 0.1250 mg | SUBLINGUAL_TABLET | Freq: Four times a day (QID) | SUBLINGUAL | Status: DC | PRN

## 2021-03-29 MED ORDER — PANTOPRAZOLE SODIUM 40 MG PO PACK
40.0000 mg | PACK | Freq: Two times a day (BID) | ORAL | 2 refills | Status: DC
  Filled 2021-03-29: qty 1200, 30d supply, fill #0

## 2021-03-29 MED ORDER — RESOURCE INSTANT PROTEIN PO PWD PACKET
1.0000 | ORAL | 0 refills | Status: AC
  Filled 2021-03-29 – 2021-05-09 (×2): qty 1080, 30d supply, fill #0

## 2021-03-29 MED ORDER — OMEPRAZOLE MAGNESIUM 10 MG PO PACK: 20.0000 mg | PACK | Freq: Every day | ORAL | Status: DC

## 2021-03-29 MED ORDER — IOHEXOL 9 MG/ML PO SOLN
500.0000 mL | ORAL | Status: AC
Start: 2021-03-29 — End: 2021-03-29

## 2021-03-29 MED ORDER — ADULT MULTIVITAMIN LIQUID CH
15.0000 mL | Freq: Every day | ORAL | 2 refills | Status: AC
  Filled 2021-03-29: qty 450, 30d supply, fill #0

## 2021-03-29 MED ORDER — OSMOLITE 1.2 CAL PO LIQD: 50.0000 mL | ORAL | 0 refills | Status: DC

## 2021-03-29 MED ORDER — NOVOLOG FLEXPEN 100 UNIT/ML ~~LOC~~ SOPN
0.0000 [IU] | PEN_INJECTOR | Freq: Three times a day (TID) | SUBCUTANEOUS | 3 refills | Status: DC
  Filled 2021-03-29: qty 15, 25d supply, fill #0

## 2021-03-29 MED ORDER — HYDROCHLOROTHIAZIDE 10 MG/ML ORAL SUSPENSION
6.2500 mg | Freq: Every day | ORAL | 0 refills | Status: DC
  Filled 2021-03-29: qty 18.9, 30d supply, fill #0

## 2021-03-29 MED ORDER — ACETAMINOPHEN 160 MG/5ML PO SOLN
650.0000 mg | Freq: Four times a day (QID) | ORAL | 0 refills | Status: DC | PRN
  Filled 2021-03-29: qty 120, 2d supply, fill #0

## 2021-03-29 MED ORDER — HYOSCYAMINE SULFATE SL 0.125 MG SL SUBL
0.1250 mg | SUBLINGUAL_TABLET | Freq: Four times a day (QID) | SUBLINGUAL | 1 refills | Status: DC | PRN
  Filled 2021-03-29: qty 30, 7d supply, fill #0

## 2021-03-29 MED ORDER — ONDANSETRON 4 MG PO TBDP
4.0000 mg | ORAL_TABLET | Freq: Four times a day (QID) | ORAL | 0 refills | Status: DC | PRN
  Filled 2021-03-29: qty 40, 10d supply, fill #0

## 2021-03-29 MED ORDER — FREE WATER: 60.0000 mL | Freq: Three times a day (TID) | Status: DC

## 2021-03-29 MED ORDER — DILTIAZEM 12 MG/ML ORAL SUSPENSION
60.0000 mg | Freq: Three times a day (TID) | ORAL | 0 refills | Status: DC
  Filled 2021-03-29: qty 540, 30d supply, fill #0

## 2021-03-29 NOTE — Patient Outreach (Signed)
[facility]) Care Management  03/29/2021  RAKWON LETOURNEAU 10/10/1960 [id number]   Transition of care    Referral received:81/22 Initial outreach:03/26/21 Insurance: [facility] Focus  Multidisciplinary Case Discussion on 03/28/21  Patient case was discussed in a Multidisciplinary Case Discussion .    Objective: Per the electronic medical record, Mr. Dolinsky  was hospitalized at [facility]  7/24-03/25/21 for dehydration, stricture near GJ anastomosis.,7/27 Gastrostomy tube placed for tube feedings.   Comorbidities /PMHx include: On 02/18/21  Laparoscopic gastric band conversion to gastric bypass  . He was discharged to home on 03/25/10 with Advanced home care services [facility] RN and durable medical equipment of tube feeding /supplies per discharge summary. Patient readmitted on 03/26/21 to [facility] , Gastrostomy tube obstruction, Gastrostomy tube exchange on 8/3 by interventional radiology.    Plan Will follow for transition of care pending discharge disposition plans.    Egbert Garibaldi, RN, BSN  [facility] Care Management,Care Management Coordinator  [phone]- Mobile [phone]- Toll Free Main Office

## 2021-03-29 NOTE — Progress Notes (Signed)
Progress Note     Subjective: Struggling as he is spitting up and vomiting every 20 minutes or all night and even during the day when sitting up.  Sometimes this is just foamy spit, but other times is has some TF and bile in it.     Objective: Vital signs in last 24 hours: Temp:  [97.5 F (36.4 C)-98.8 F (37.1 C)] 98.8 F (37.1 C) (08/05 0359) Pulse Rate:  [62-70] 70 (08/05 0359) Resp:  [16-18] 16 (08/05 0359) BP: (118-154)/(79-93) 118/79 (08/05 0359) SpO2:  [92 %-96 %] 95 % (08/05 0359) Last BM Date: 03/28/21  Intake/Output from previous day: 08/04 0701 - 08/05 0700 In: 1005.6 [I.V.:895.6; NG/GT:110] Out: -  Intake/Output this shift: No intake/output data recorded.  PE: General: pleasant, WD, obese male  Abd: soft, NT, obese, gastrostomy in place Psych: A&Ox3 with an appropriate affect.   Lab Results:  Recent Labs    03/27/21 1522 03/28/21 0434  WBC 9.2 11.3*  HGB 14.7 14.0  HCT 45.8 43.6  PLT 231 215   BMET Recent Labs    03/26/21 2140 03/27/21 1522 03/28/21 0434  NA 135  --  137  K 3.7  --  3.4*  CL 98  --  102  CO2 27  --  26  GLUCOSE 148*  --  131*  BUN 10  --  9  CREATININE 0.74 0.71 0.58*  CALCIUM 9.0  --  8.9   PT/INR No results for input(s): LABPROT, INR in the last 72 hours. CMP     Component Value Date/Time   NA 137 03/28/2021 0434   K 3.4 (L) 03/28/2021 0434   CL 102 03/28/2021 0434   CO2 26 03/28/2021 0434   GLUCOSE 131 (H) 03/28/2021 0434   BUN 9 03/28/2021 0434   CREATININE 0.58 (L) 03/28/2021 0434   CALCIUM 8.9 03/28/2021 0434   PROT 6.4 (L) 03/22/2021 0444   ALBUMIN 3.1 (L) 03/22/2021 0444   AST 15 03/22/2021 0444   ALT 15 03/22/2021 0444   ALKPHOS 51 03/22/2021 0444   BILITOT 0.6 03/22/2021 0444   GFRNONAA >60 03/28/2021 0434   GFRAA >60 05/22/2020 0827   Lipase     Component Value Date/Time   LIPASE 84 (H) 03/09/2021 0443       Studies/Results: IR GJ Tube Change  Result Date: 03/27/2021 INDICATION:  60 year old male male with clogged gastrostomy tube. EXAM: FLUOROSCOPIC GUIDED REPLACEMENT OF GASTROSTOMY TUBE COMPARISON:  KUB, 03/26/2021. MEDICATIONS: None. CONTRAST:  65mL OMNIPAQUE IOHEXOL 300 MG/ML SOLN - administered into the gastric lumen FLUOROSCOPY TIME:  0 minutes, 36 seconds.  Dose: 12 mGy. COMPLICATIONS: None immediate. PROCEDURE: Informed written consent was obtained from the patient after a discussion of the risks, benefits and alternatives to treatment. Questions regarding the procedure were encouraged and answered. A timeout was performed prior to the initiation of the procedure. The upper abdomen and external portion of the existing gastrostomy tube was prepped and draped in the usual sterile fashion, and a sterile drape was applied covering the operative field. Maximum barrier sterile technique with sterile gowns and gloves were used for the procedure. A timeout was performed prior to the initiation of the procedure. The existing gastrostomy tube was clogged and therefore contrast injection confirming appropriate positioning within the gastric lumen could not be performed. Next, the gastrostomy tube was cannulated with a stiff Glidewire which was coiled within the gastric fundus. The retention balloon was deflated. Over the stiff guide wire, the existing gastrostomy tube was  exchanged for a new 18-French balloon inflatable gastrostomy tube. The balloon was inflated with saline and dilute contrast and pulled against the anterior inner lumen of the stomach and the external disc was cinched. Contrast was injected and a post procedural spot fluoroscopic image was obtained confirming appropriate positioning and functionality of the new gastrostomy tube. A dressing was applied. The patient tolerated the procedure well without immediate postprocedural complication. IMPRESSION: Successful fluoroscopic guided replacement of a new 18-French gastrostomy tube. The gastrostomy tube is ready for immediate use.  Roanna Banning, MD [facility] Inter[facility] Radiology Specialists [facility] Radiology Electronically Signed   By: Roanna Banning MD   On: 03/27/2021 16:22    Anti-infectives: Anti-infectives (From admission, onward)    None        Assessment/Plan Mr. Deshmukh is a 60 year old male who underwent difficult laparoscopic conversion of laparoscopic adjustable gastric band to gastric bypass on 02/18/2021.  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 This was unable to be traversed with an endoscope, so he underwent laparoscopic remnant gastrostomy tube placement 03/20/2021. - gastric tube replaced by IR  - appreciate dietitian as well as diabetes coordinator assistance with this patient. -conts to struggle with vomiting/spitting up.  Will order a CT scan as he shouldn't have TFs coming back up unless he is refluxing all the way back up this limb to his pouch.  Will eval for etiology of this. -cont inpatient for now.  FEN - osmolite 50cc/2hrs during day/IVFs 75cc/hr VTE - Lovenox ID - none   LOS: 3 days    Letha Cape, [facility] Surgery 03/29/2021, 9:01 AM Please see Amion for pager number during day hours 7:00am-4:30pm

## 2021-03-29 NOTE — Discharge Instructions (Signed)
For blood sugars: 0 to 120           give 0 units Novolog 121 to 150       Give 3 units 151 to 200       Give 4 units 201 to 250       Give 7 units 251 to 300       Give  11 units 301 to 350       Give 15 units 351 to 400       Give 20 units >400                Call Doctor

## 2021-03-29 NOTE — Plan of Care (Signed)
Instructions were reviewed with patient. All questions were answered. Patient was transported to main entrance by wheelchair. ° °

## 2021-03-29 NOTE — Progress Notes (Signed)
Nutrition Follow-up  DOCUMENTATION CODES:   Non-severe (moderate) malnutrition in context of acute illness/injury, Obesity unspecified  INTERVENTION:   - Continue to advance tube feeding as tolerated to goal of 100 ml Osmolite 1.2 every 2 hours while awake(~7 feeds daily). -Free water flush with 30 ml before and after each bolus -Beneprotein powder mixed with 60 ml flushed with every feed(8 total) -Need additional 240 ml TID via G-tube (720 ml) -Provides 1040 kcals, 86g protein and 1714 ml H2O   -Reviewed with pt's wife some bariatric liquid multivitamins and calcium supplements for pt to flush via tube (separate each dose by at least 2 hours).  NUTRITION DIAGNOSIS:   Moderate Malnutrition related to acute illness as evidenced by energy intake < or equal to 75% for > or equal to 1 month, percent weight loss.  Ongoing.  GOAL:   Patient will meet greater than or equal to 90% of their needs  Not meeting.  MONITOR:   Labs, Weight trends, TF tolerance, I & O's  ASSESSMENT:   60 y.o. male with past medical history significant for A. fib, CAD, diabetes, recent gastric bypass surgery with [place] surgery on7/18/22, who presents for evaluation of feeding tube complication.  Apparently had hospitalizations due to intractable emesis since surgery.  Had G-tube placed approximately 9 days ago.  Patient in room asleep, did not disturb as pt has had a poor night's sleep. Spoke with pt's wife who was at bedside. Plan is for discharge this afternoon. Pt resumed bolus feedings today around noon. Was having instances of N/V-mostly spit, bile and some TF.  Pt to continue to try and advance Osmolite to goal rate stated above. Pt's wife with questions regarding obtaining liquid forms of vitamins and calcium for pt. Reviewed some options she can order online [employer] brand). Spoke with Pharmacy on the phone, they are looking into options that she can have delivered. Pharmacy also looking  into Beneprotein for pt but wife may have to buy online as well.   Admission weight: 292 lbs. No other weights this admission.  Medications: Colace, liquid MVI, IV Zofran  Labs reviewed: CBGs: 122-194   Diet Order:   Diet Order             Diet NPO time specified Except for: Ice Chips  Diet effective now                   EDUCATION NEEDS:   Education needs have been addressed  Skin:  Skin Assessment: Skin Integrity Issues: Skin Integrity Issues:: Incisions Incisions: 7/27 abdomen  Last BM:  8/4  Height:   Ht Readings from Last 1 Encounters:  03/27/21 6\' 2"  (1.88 m)    Weight:   Wt Readings from Last 1 Encounters:  03/27/21 132.6 kg    BMI:  Body mass index is 37.53 kg/m.  Estimated Nutritional Needs:   Kcal:  1400-1600  Protein:  80-100g  Fluid:  2L/day   05/27/21, MS, RD, LDN Inpatient Clinical Dietitian Contact information available via Amion

## 2021-04-01 ENCOUNTER — Other Ambulatory Visit: Payer: Self-pay | Admitting: *Deleted

## 2021-04-01 ENCOUNTER — Other Ambulatory Visit (HOSPITAL_COMMUNITY): Payer: Self-pay

## 2021-04-01 NOTE — Patient Outreach (Signed)
[facility]) Care Management  04/01/2021  Hector Curry June 18, 1961 [id number]   Transition of care telephone call  Referral received:03/26/21 Initial outreach:03/26/21 Insurance: [facility]   Initial unsuccessful telephone call to patient's preferred number in order to complete transition of care assessment; no answer, left HIPAA compliant voicemail message requesting return call.  Unsuccessful outreach call to patient wife Hector Curry, [place] able to leave HIPAA compliant voicemail message.   Objective: Per the electronic medical record, Hector Curry  was hospitalized at [facility]  7/24-03/25/21 for dehydration, stricture near GJ anastomosis.,7/27 Gastrostomy tube placed for tube feedings.   Comorbidities /PMHx include: On 02/18/21  Laparoscopic gastric band conversion to gastric bypass . He was discharged to home on 03/25/10 with Advanced home care services [facility] RN and durable medical equipment of tube feeding /supplies per discharge summary. Patient readmitted on 03/26/21 to [facility] , Gastrostomy tube obstruction, Gastrostomy tube exchange on 8/3 by interventional radiology. Patient discharged to home on 03/29/21    Plan: Unsuccessful outreach letter with [facility] Care Management routed within the last 4 business days after prior discharge. This RNCM will attempt another outreach within 4 business days.   Hector Garibaldi, RN, BSN  [facility] Care Management,Care Management Coordinator  [phone]- Mobile [phone]- Toll Free Main Office

## 2021-04-01 NOTE — Discharge Summary (Signed)
Patient ID: Hector Curry [id number] 1961-06-13 60 y.o.  Admit date: 03/26/2021 Discharge date: 03/29/2021  Admitting Diagnosis: Clogged g-tube  Discharge Diagnosis 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   Morbid obesity (HCC) 02/18/2021   History of kidney stones 05/30/2016   Elevated LFTs 02/13/2016   Morbid obesity with BMI of 40.0-44.9, adult (HCC) 12/20/2015   Atrial tachycardia (HCC) 06/21/2015   Hypertriglyceridemia 05/15/2015   Coronary artery disease, non-occlusive    Type II diabetes mellitus, uncontrolled (HCC) 05/08/2015   Chest pain at rest    Hyperglycemia 05/07/2015   Chest pain 05/06/2015   GERD (gastroesophageal reflux disease)    Kidney stones    Hemorrhoids    Inflammatory arthritis    Family history of premature CAD    OSA (obstructive sleep apnea)    History of tobacco abuse    Palpitations    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/20/2014   Diverticulosis 10/03/2014   Hyperplastic colon polyp 10/03/2014   HTN, goal below 140/90 08/07/2014   Pain in the chest 06/19/2014   SOB (shortness of breath) 06/19/2014   Obesity 06/19/2014   Hyperlipidemia 06/19/2014    Consultants none  Reason for Admission: Patient was discharge 8/1, feedings were initially going well at home. Patient took a nap yesterday AM and when he got up around noon, feeding tube was clogged. He's was unable to get unclogged with coke or warm water. He went to [facility] and was transferred here. He has been spitting up liquid.  Procedures IR g-tube exchange, 03/27/21  Hospital Course:  The patient was admitted for a clogged g-tube.  This was exchanged and his TFs were restarted.  He continued to struggle some with the "foamies" secondary to his narrowing at  his GJ anastomosis. We also adjusted his medications to solutions to minimize his risk of further clogging of his g-tube.  On HD2, the patient was stable and ready for DC home on SSI for his diabetes and solutions for most all of his medications.    Allergies as of 03/29/2021       Reactions   Other Other (See Comments)   Cats        Medication List     STOP taking these medications    acetaminophen 500 MG tablet Commonly known as: TYLENOL Replaced by: acetaminophen 160 MG/5ML solution   aspirin EC 81 MG tablet   bisoprolol-hydrochlorothiazide 10-6.25 MG tablet Commonly known as: ZIAC   cetirizine 10 MG tablet Commonly known as: ZYRTEC   cholecalciferol 1000 units tablet Commonly known as: VITAMIN D   diltiazem 60 MG tablet Commonly known as: CARDIZEM Replaced by: diltiazem 10 mg/ml Soln oral suspension   docusate sodium 100 MG capsule Commonly known as: COLACE Replaced by: docusate 50 MG/5ML liquid   fenofibrate 145 MG tablet Commonly known as: TRICOR   Flaxseed Oil 1200 MG Caps   fluticasone 50 MCG/ACT nasal spray Commonly known as: FLONASE   magnesium gluconate 500 MG tablet Commonly known as: MAGONATE   metFORMIN 850 MG tablet Commonly known as: GLUCOPHAGE   OVER THE COUNTER MEDICATION   oxyCODONE-acetaminophen 5-325 MG tablet Commonly known as: Percocet   pantoprazole 40 MG tablet Commonly known as: Protonix   pioglitazone 30 MG tablet Commonly known as: ACTOS   propranolol 20  MG tablet Commonly known as: INDERAL   QUERCETIN PO   Sodium Fluoride 5000 Sensitive 1.1-5 % Gel Generic drug: Sod Fluoride-Potassium Nitrate   Turmeric 500 MG Caps   zinc gluconate 50 MG tablet       TAKE these medications    acetaminophen 160 MG/5ML solution Commonly known as: TYLENOL Place 20.3 mLs (650 mg total) into feeding tube every 6 (six) hours as needed for mild pain, headache or fever. Replaces: acetaminophen 500 MG tablet Notes to patient:  Pain control. You already have this in bag from [facility]. Take as needed.   diltiazem 10 mg/ml Soln oral suspension Commonly known as: CARDIZEM Place 6 mLs (60 mg total) into feeding tube every 8 (eight) hours. Replaces: diltiazem 60 MG tablet Notes to patient: BP/HR/Cardiac medicine. You do not currently have this medicine. This medication is being made @ [facility] in [place]. [phone]. They will call [name] when it is ready for pickup. Take the capsules you have now, dissolved in warm water. Once you have the liquid, disregard capsules. Take @ 6am, 2pm, and 10pm.   docusate 50 MG/5ML liquid Commonly known as: COLACE Place 10 mLs (100 mg total) into feeding tube 2 (two) times daily. Replaces: docusate sodium 100 MG capsule Notes to patient: Stool softener. On hold at [facility]. If you need this, call them to fill it.   feeding supplement (OSMOLITE 1.2 CAL) Liqd Place 1,000 mLs into feeding tube continuous. -Begin with 50 ml Osmolite 1.2 via G-tube every 2 hours (8 feedings total while awake) -Free water flush with 30 ml before and after each bolus As tolerated work up to 100 ml Osmolite 1.2 7 times daily (~every 2 hours). -Continue Prosource TF BID. -3 cartons total of Osmolite 1.2 + Prosource TF BID provides 935 kcals (66% of needs), 61g protein (77% of needs), 112g CHO and 1005 ml H2O. -Free water of 240 ml TID via G-tube or PO -Will meet rest of needs with other liquids/diet and protein supplements What changed: Another medication with the same name was added. Make sure you understand how and when to take each. Notes to patient: Tube feeds. Should have supply at home. Give @ 8am, 10a, 12am, 2pm, 4pm, 6pm, 8pm, 10pm.   feeding supplement (PROSource TF) liquid Place 45 mLs into feeding tube 2 (two) times daily. -Begin with 50 ml Osmolite 1.2 via G-tube every 2 hours (8 feedings total while awake) -Free water flush with 30 ml before and after  each bolus As tolerated work up to 100 ml Osmolite 1.2 7 times daily (~every 2 hours). -Continue Prosource TF BID. -3 cartons total of Osmolite 1.2 + Prosource TF BID provides 935 kcals (66% of needs), 61g protein (77% of needs), 112g CHO and 1005 ml H2O. -Free water of 240 ml TID via G-tube or PO -Will meet rest of needs with other liquids/diet and protein supplements What changed: Another medication with the same name was added. Make sure you understand how and when to take each. Notes to patient: Protein. Not been given this while in the hospital. Switched to beneprotein instead.   feeding supplement (OSMOLITE 1.2 CAL) Liqd Place 50 mLs into feeding tube every 2 (two) hours. What changed: You were already taking a medication with the same name, and this prescription was added. Make sure you understand how and when to take each. Notes to patient: Duplicate order.   free water Soln Place 60 mLs into feeding tube every 8 (eight)  hours. Notes to patient: This will be mixed with the beneprotein if necessary. If it is not time for beneprotein. Continue with flushing his food and meds with 30cc before and 30cc after.   FreeStyle Libre 2 Sensor Misc Use as directed every 14 days Notes to patient: Sensor in back of his arm. Follow up with PCP for new script close to when he is about to run out. Save the sticker :)   HumaLOG KwikPen 100 UNIT/ML KwikPen Generic drug: insulin lispro Inject 0-20 Units into the skin 3 (three) times daily with meals. (according to sliding scale) Notes to patient: Insulin for his diabetes.Taking the place of novolog. Depending on what his sugar is, you will dial up how much to give him. Remember new needle every time. Clean hub of the kwikpen with an alcohol wipe before applying new needle. Hold for 5 seconds after injecting his with insulin. Dispose of pen needles into sharps container  or used soda bottle. Check his sugar @ 8am, 12pm, 5pm.    hydrochlorothiazide 10  mg/mL Susp Place 0.63 mLs (6.25 mg total) into feeding tube daily. Notes to patient: Blood pressure. Also is a diuretic. This is @ [facility]. It will be ready Monday 8/8. They will call you when its ready. In the meantime, TAKE ZIAC IN THE PLACE OF THIS TILL YOU PICK UP THE MEDICINE. This will need to be crushed up in warm water. Give it before his food. Flush with water after his food. Give ziac once a day @ 10am.   metoprolol tartrate 25 mg/10 mL Susp Commonly known as: LOPRESSOR Place 10 mLs (25 mg total) into feeding tube 2 (two) times daily. Notes to patient: Blood pressure.This is @ [facility]. It will be ready Monday 8/8. They will call you when its ready. In the meantime, TAKE ZIAC IN THE PLACE OF THIS TILL YOU PICK UP THE MEDICINE. This will need to be crushed up in warm water. Give it before his food. Flush with water after his food. Give ziac once a day @ 10am. Give the metoprolol liquid once you get it @ 10am and 10pm.   multivitamin Liqd Place 15 mLs into feeding tube daily. Notes to patient: Vitamin. You are getting this from online source. Give @ 10am or more suitable time if 10am already has too much medicine. Can move it to 12pm feeding or 2pm feeding if its easier. It is only once a day.   Omeprazole Magnesium 10 MG Pack Take 20 mg by mouth daily. Notes to patient: Anti-acid. You will need to get this over-the-counter. It will come in a packet form. Mix with water, see instructions on the box it comes in. Give @ 10am or more suitable time if 10am already has too much medicine. Can move it to 12pm feeding or 2pm feeding if its easier. It is only once a day.   ondansetron 4 MG disintegrating tablet Commonly known as: ZOFRAN-ODT Dissolve 1 tablet (4 mg total) by mouth every 6 (six) hours as needed for nausea. What changed: Another medication with the same name was removed. Continue taking this medication, and follow the directions you see here. Notes to patient:  Anti-nausea. You have this medicine from [facility]. Give as needed. Dissolve under the tongue.   Oscimin 0.125 MG Subl Generic drug: Hyoscyamine Sulfate SL Place 1 tablet (0.125 mg) under the tongue every 6 (six) hours as needed for cramping Notes to patient: Anti-cramp. You have this medicine.  Take as needed. Dissolve under the tongue.   protein supplement 6 g Powd Commonly known as: RESOURCE BENEPROTEIN Place 1 Scoop (6 g total) into feeding tube every 3 (three) hours while awake. Notes to patient: Beneprotein. Will be ready at [facility] 8/8.I got some additional doses to try to hold you over until then. Mix with 60ml of warm water. Give before food. Give @ 9am, 12pm, 3pm, 6pm, 9pm.    SYSTANE BALANCE OP Place 1 drop into both eyes daily as needed (dry eyes).   Unifine Pentips 32G X 4 MM Misc Generic drug: Insulin Pen Needle Use as needed as directed Notes to patient: Pen needles for humalog kwikpen. You have these. Clean hub of kwikpen before applying one. Hold pen needle to skin for 5 seconds after injecting insulin. Discard pen needles in sharps container you can [facility] :) or used soda bottle.   zolpidem 5 MG tablet Commonly known as: AMBIEN Take 1 tablet (5 mg total) by mouth at bedtime as needed for sleep. Notes to patient: Sleep medicine. You have this medicine. This is not dissolvable and can not be made into liquid form. You will have to crush this medicine and stir in warm water. You will have to get a new script for this from your PCP because it is a controlled substance.        ASK your doctor about these medications    potassium chloride SA 20 MEQ tablet Commonly known as: KLOR-CON TAKE 1 TABLET BY MOUTH TWO TIMES DAILY          Follow-up Information     Kinsinger, De BlanchLuke Aaron, MD Follow up in 2 week(s).   Specialty: General Surgery Why: Follow up at already scheduled appointment.  if not appointment already  in system, please call to schedule to be seen in 1-2 weeks Contact information: [address] [place] [address] [phone]                 Signed: Barnetta ChapelKelly Maize Brittingham, [facility] 04/01/2021, 10:38 AM Please see Amion for pager number during day hours 7:00am-4:30pm, 7-11:30am on Weekends

## 2021-04-03 ENCOUNTER — Other Ambulatory Visit (HOSPITAL_COMMUNITY): Payer: Self-pay

## 2021-04-03 ENCOUNTER — Telehealth: Payer: Self-pay | Admitting: Cardiovascular Disease

## 2021-04-03 DIAGNOSIS — E876 Hypokalemia: Secondary | ICD-10-CM

## 2021-04-03 MED ORDER — POTASSIUM CHLORIDE 20 MEQ/15ML (10%) PO SOLN
20.0000 meq | Freq: Two times a day (BID) | ORAL | 2 refills | Status: DC
  Filled 2021-04-03: qty 473, 16d supply, fill #0
  Filled 2021-04-22: qty 473, 16d supply, fill #1
  Filled 2021-05-11: qty 473, 16d supply, fill #2

## 2021-04-03 NOTE — Telephone Encounter (Signed)
Spoke with patients wife per release form and reviewed her request for liquid potassium to administer through his feeding tube. Advised that I would check with provider for recommendations and would call her back with that information. She verbalized understanding.

## 2021-04-03 NOTE — Telephone Encounter (Signed)
Review of labs indicates he is intermittently hypokalemic with most recent BMP from 03/28/2021 demonstrating a potassium of 3.4.  Currently, we have on file that he is taking KCl 20 mEq twice daily.  This dose has been in the context of him taking HCTZ 6.25 mg daily, which he remains on at this time, in suspension form.  Please have him discontinue KCl tablets.  Start KCl suspension 15 mL (20 mEq) twice daily per feeding tube.  If possible, to ensure stable dosing, please have a BMET drawn 1 week after initiating suspension.  Please call his pharmacy to indicate to them we have discontinued the KCl tab prescription in place of the suspension as above.

## 2021-04-03 NOTE — Telephone Encounter (Signed)
Patient wife, Alvis Lemmings, states patient has a feeding tube and asks if patient still needs to be taking Potassium, and if so, can it be in liquid form. Please call to discuss.

## 2021-04-03 NOTE — Telephone Encounter (Signed)
Spoke with patients wife per release form and reviewed instructions for the liquid suspension of potassium in place of the tablets. Connection was not good but she did respond understanding of information with no questions. Offered to send instructions through my chart as well.   Pharmacy called and they have in fact discontinued the tablet form of potassium and confirmed prescription received for suspension.

## 2021-04-04 ENCOUNTER — Other Ambulatory Visit (HOSPITAL_COMMUNITY): Payer: Self-pay

## 2021-04-04 ENCOUNTER — Other Ambulatory Visit: Payer: Self-pay | Admitting: *Deleted

## 2021-04-04 ENCOUNTER — Ambulatory Visit: Payer: No Typology Code available for payment source | Admitting: Skilled Nursing Facility1

## 2021-04-04 NOTE — Patient Outreach (Signed)
[facility]) Care Management  04/04/2021  HARLIS CHAMPOUX 01/06/1961 [id number]   Transition of care call Referral received: 03/26/21 Initial outreach attempt: 04/01/21 after readmission. Insurance: [employer]    2nd unsuccessful telephone call to patient's preferred contact number in order to complete post hospital discharge transition of care assessment , no answer left HIPAA compliant message requesting return call.  Unsuccessful outreach call to patient wife Traevon Meiring , Designated Party Release no answer able to leave a HIPAA compliant voice mail message for return call.    Objective: Per the electronic medical record, Mr. 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   Comorbidities /PMHx include: On 02/18/21  Laparoscopic gastric band conversion to gastric bypass . He was discharged to home on 03/25/10 with [facility] RN and durable medical equipment of tube feeding /supplies per discharge summary. Patient readmitted on 03/26/21 to [facility] , Gastrostomy tube obstruction, Gastrostomy tube exchange on 8/3 by interventional radiology. Patient discharged to home on 03/29/21      Plan If no return call from patient will attempt 3rd outreach in the next 4 business days.   Egbert Garibaldi, RN, BSN  [facility] Care Management,Care Management Coordinator  [phone]- Mobile [phone]- Toll Free Main Office

## 2021-04-05 ENCOUNTER — Other Ambulatory Visit: Payer: Self-pay

## 2021-04-06 ENCOUNTER — Other Ambulatory Visit (HOSPITAL_COMMUNITY): Payer: Self-pay

## 2021-04-08 ENCOUNTER — Other Ambulatory Visit: Payer: Self-pay | Admitting: *Deleted

## 2021-04-08 NOTE — Patient Outreach (Signed)
[facility]) Care Management  04/08/2021  CHRISS MANNAN 1960/10/15 [id number]   Transition of care call Referral received: 03/26/21  Initial outreach attempt: 8/8//22 after readmission  Insurance: [employer]   Third unsuccessful telephone call to patient's preferred contact number in order to complete post hospital discharge transition of care assessment; no answer, left HIPAA compliant message requesting return call.   Objective: Per the electronic medical record, Mr. 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   Comorbidities /PMHx include: On 02/18/21  Laparoscopic gastric band conversion to gastric bypass . He was discharged to home on 03/25/10 with [facility] RN and durable medical equipment of tube feeding /supplies per discharge summary. Patient readmitted on 03/26/21 to [facility] , Gastrostomy tube obstruction, Gastrostomy tube exchange on 8/3 by interventional radiology. Patient discharged to home on 03/29/21 and followed by [facility] as noted in Care everywhere communication with PCP office.   Plan: If no return call from patient, will plan return call in the next 3 weeks.     Egbert Garibaldi, RN, BSN  [facility] Care Management,Care Management Coordinator  [phone]- Mobile [phone]- Toll Free Main Office

## 2021-04-11 ENCOUNTER — Other Ambulatory Visit (HOSPITAL_COMMUNITY): Payer: Self-pay

## 2021-04-11 MED ORDER — CETIRIZINE HCL 5 MG/5ML PO SOLN
ORAL | 11 refills | Status: DC
  Filled 2021-04-11: qty 300, 30d supply, fill #0

## 2021-04-12 ENCOUNTER — Other Ambulatory Visit: Payer: Self-pay

## 2021-04-12 ENCOUNTER — Other Ambulatory Visit (HOSPITAL_COMMUNITY): Payer: Self-pay

## 2021-04-12 ENCOUNTER — Telehealth: Payer: Self-pay

## 2021-04-12 DIAGNOSIS — R1310 Dysphagia, unspecified: Secondary | ICD-10-CM

## 2021-04-12 NOTE — Telephone Encounter (Signed)
----- Message from Lemar Lofty., MD sent at 04/11/2021  4:52 PM EDT ----- Regarding: RE: difficult revisional case Hector Curry, This is a patient that Dr. Sheliah Hatch and Dr. Barron Alvine and I have been talking about. Please reach out to the patient and schedule him a clinic visit with me in the next 2 to 3 weeks (okay to overbook). Start looking for an EGD fluoroscopy 60-minute slot and tentatively place the patient in a procedure slot. Thanks. GM ----- Message ----- From: Rodman Pickle, MD Sent: 04/11/2021  12:01 PM EDT To: Lemar Lofty., MD, # Subject: RE: difficult revisional case                  Hi,  Thank you for the responses. I saw Hector Curry in clinic today. He said after the g tube insertion/endoscopy attempt he had 1 week not able to tolerate saliva, but is now tolerating saliva and 10 oz clear liquids daily. He is very happy with the tube feedings and understands this may take a while for improvement. He wanted to meet you and hear about possible endoscopic maneuvers and proceed under your direction. I'll send a referral over to your office today with your 2 names on it.  Hector Curry  ----- Message ----- From: Shellia Cleverly, DO Sent: 04/09/2021  10:37 AM EDT To: Rodman Pickle, MD, # Subject: RE: difficult revisional case                  Good meeting you in person the other day. Sorry for my delayed response. Agree with what GM is saying. It's interesting to still have persistent dysphagia from what is likely extraluminal compression from edema/scarring. I think it would be worth relook and start the process of gradual dilation to hopefully avoid E-J conversion. Might need to initially get through with the ultraslim and pass a wire.   Similarly I had wondered if this would potentially be a stent case, in which I definitely defer to [facility] expertise.   Thanks for sending over.  Hector Curry ----- Message ----- From: Lemar Lofty., MD Sent: 04/07/2021    9:07 PM EDT To: Rodman Pickle, MD, # Subject: RE: difficult revisional case                  LK, Interesting case. Sorry patient is having so many issues. You were able to pass the scope at the time of your initial operation. Why he would stricture that area after band removal is hard to understand, the thoughts of this being edematous with hope that time would allow things to open up makes sense. Please let us know, so that we can try to work in a time slot for repeat EGD and attempt at dilation.  If this is a very short stricture, I may have a stent that could be helpful. I have seen longer covered stents in this particular situation be too long and cause issues in regards to jejunal perforations. So it could be an initial EGD evaluation and dilation to understand things, and then from there consider other interventions. If after you see him, he wants to meet [place], then let us know, otherwise we can try to get him in for direct procedure. Any other thoughts VC? Thanks. GM ----- Message ----- From: Rodman Pickle, MD Sent: 04/05/2021   8:06 AM EDT To: Lemar Lofty., MD, # Subject: difficult revisional case  Hector Curry and Hector Curry,  I talked to [place] about this difficult case this morning. Pt had long standing band with worsening reflux/dysphagia, I converted to a RNY bypass. Initially he did well but when transitioning to solid food had regurgitation. UGI shows narrowing at band scar area. I attempted endoscopy 2 times without success in getting past GE junction. I placed g tube into remnant stomach to avoid give time and planning. He has been able to tolerate saliva and small amounts of liquids but all nutrition and pills are via g tube now.  I am hoping there is some possibility for dilation/stent with your expert hands as revision at this level and the amount of scar we dealt with would be very difficult and result in E-J.  I think I see him in clinic  next week and will refer him over to you for help.  Thank you, so happy to have great GI proceduralists to work with.  Hector Curry

## 2021-04-12 NOTE — Telephone Encounter (Signed)
Tried again to reach pt and voice mail is now full

## 2021-04-12 NOTE — Telephone Encounter (Signed)
The pt has been scheduled for an office visit with GM on 05/03/21 at 930 am.   EGD Fluoro has been scheduled for 05/27/21 at 730 am at [facility] with GM  Left message on machine to call back

## 2021-04-14 ENCOUNTER — Inpatient Hospital Stay (HOSPITAL_COMMUNITY)
Admission: EM | Admit: 2021-04-14 | Discharge: 2021-04-16 | DRG: 247 | Disposition: A | Discharge status: Home Health | Payer: No Typology Code available for payment source | Attending: Internal Medicine | Admitting: Internal Medicine

## 2021-04-14 ENCOUNTER — Encounter (HOSPITAL_COMMUNITY): Payer: Self-pay

## 2021-04-14 ENCOUNTER — Other Ambulatory Visit: Payer: Self-pay

## 2021-04-14 ENCOUNTER — Emergency Department (HOSPITAL_COMMUNITY): Payer: No Typology Code available for payment source

## 2021-04-14 DIAGNOSIS — Z794 Long term (current) use of insulin: Secondary | ICD-10-CM

## 2021-04-14 DIAGNOSIS — Z931 Gastrostomy status: Secondary | ICD-10-CM

## 2021-04-14 DIAGNOSIS — R079 Chest pain, unspecified: Secondary | ICD-10-CM | POA: Diagnosis not present

## 2021-04-14 DIAGNOSIS — Z79899 Other long term (current) drug therapy: Secondary | ICD-10-CM

## 2021-04-14 DIAGNOSIS — Z20822 Contact with and (suspected) exposure to covid-19: Secondary | ICD-10-CM | POA: Diagnosis present

## 2021-04-14 DIAGNOSIS — E871 Hypo-osmolality and hyponatremia: Secondary | ICD-10-CM | POA: Diagnosis present

## 2021-04-14 DIAGNOSIS — I214 Non-ST elevation (NSTEMI) myocardial infarction: Principal | ICD-10-CM | POA: Diagnosis present

## 2021-04-14 DIAGNOSIS — M1990 Unspecified osteoarthritis, unspecified site: Secondary | ICD-10-CM | POA: Diagnosis present

## 2021-04-14 DIAGNOSIS — E785 Hyperlipidemia, unspecified: Secondary | ICD-10-CM | POA: Diagnosis present

## 2021-04-14 DIAGNOSIS — E876 Hypokalemia: Secondary | ICD-10-CM | POA: Diagnosis present

## 2021-04-14 DIAGNOSIS — Z833 Family history of diabetes mellitus: Secondary | ICD-10-CM | POA: Diagnosis not present

## 2021-04-14 DIAGNOSIS — E1165 Type 2 diabetes mellitus with hyperglycemia: Secondary | ICD-10-CM | POA: Diagnosis present

## 2021-04-14 DIAGNOSIS — I2510 Atherosclerotic heart disease of native coronary artery without angina pectoris: Secondary | ICD-10-CM | POA: Diagnosis present

## 2021-04-14 DIAGNOSIS — Z8249 Family history of ischemic heart disease and other diseases of the circulatory system: Secondary | ICD-10-CM

## 2021-04-14 DIAGNOSIS — I471 Supraventricular tachycardia: Secondary | ICD-10-CM | POA: Diagnosis present

## 2021-04-14 DIAGNOSIS — E119 Type 2 diabetes mellitus without complications: Secondary | ICD-10-CM | POA: Diagnosis present

## 2021-04-14 DIAGNOSIS — K219 Gastro-esophageal reflux disease without esophagitis: Secondary | ICD-10-CM | POA: Diagnosis present

## 2021-04-14 DIAGNOSIS — I452 Bifascicular block: Secondary | ICD-10-CM | POA: Diagnosis present

## 2021-04-14 DIAGNOSIS — IMO0002 Reserved for concepts with insufficient information to code with codable children: Secondary | ICD-10-CM | POA: Diagnosis present

## 2021-04-14 DIAGNOSIS — I10 Essential (primary) hypertension: Secondary | ICD-10-CM | POA: Diagnosis present

## 2021-04-14 DIAGNOSIS — I472 Ventricular tachycardia: Secondary | ICD-10-CM | POA: Diagnosis not present

## 2021-04-14 DIAGNOSIS — G4733 Obstructive sleep apnea (adult) (pediatric): Secondary | ICD-10-CM | POA: Diagnosis present

## 2021-04-14 DIAGNOSIS — Z9884 Bariatric surgery status: Secondary | ICD-10-CM

## 2021-04-14 DIAGNOSIS — Z87891 Personal history of nicotine dependence: Secondary | ICD-10-CM | POA: Diagnosis not present

## 2021-04-14 DIAGNOSIS — E669 Obesity, unspecified: Secondary | ICD-10-CM | POA: Diagnosis present

## 2021-04-14 DIAGNOSIS — Z955 Presence of coronary angioplasty implant and graft: Secondary | ICD-10-CM

## 2021-04-14 DIAGNOSIS — I480 Paroxysmal atrial fibrillation: Secondary | ICD-10-CM

## 2021-04-14 LAB — BASIC METABOLIC PANEL
Anion gap: 7 (ref 5–15)
BUN: 9 mg/dL (ref 6–20)
CO2: 25 mmol/L (ref 22–32)
Calcium: 8.9 mg/dL (ref 8.9–10.3)
Chloride: 101 mmol/L (ref 98–111)
Creatinine, Ser: 0.68 mg/dL (ref 0.61–1.24)
GFR, Estimated: >60 mL/min (ref >60)
Glucose, Bld: 170 mg/dL — ABNORMAL HIGH (ref 70–99)
Potassium: 3.4 mmol/L — ABNORMAL LOW (ref 3.5–5.1)
Sodium: 133 mmol/L — ABNORMAL LOW (ref 135–145)

## 2021-04-14 LAB — HEPARIN LEVEL (UNFRACTIONATED): Heparin Unfractionated: 0.1 IU/mL — ABNORMAL LOW (ref 0.30–0.70)

## 2021-04-14 LAB — RESP PANEL BY RT-PCR (FLU A&B, COVID) ARPGX2
Influenza A by PCR: NEGATIVE
Influenza B by PCR: NEGATIVE
SARS Coronavirus 2 by RT PCR: NEGATIVE

## 2021-04-14 LAB — CBC
HCT: 47.3 % (ref 39.0–52.0)
Hemoglobin: 15.4 g/dL (ref 13.0–17.0)
MCH: 29.1 pg (ref 26.0–34.0)
MCHC: 32.6 g/dL (ref 30.0–36.0)
MCV: 89.2 fL (ref 80.0–100.0)
Platelets: 231 10*3/uL (ref 150–400)
RBC: 5.3 MIL/uL (ref 4.22–5.81)
RDW: 14.6 % (ref 11.5–15.5)
WBC: 8.6 10*3/uL (ref 4.0–10.5)
nRBC: 0 % (ref 0.0–0.2)

## 2021-04-14 LAB — GLUCOSE, CAPILLARY: Glucose-Capillary: 147 mg/dL — ABNORMAL HIGH (ref 70–99)

## 2021-04-14 LAB — TROPONIN I (HIGH SENSITIVITY)
Troponin I (High Sensitivity): 7 ng/L (ref <18)
Troponin I (High Sensitivity): 192 ng/L (ref <18)
Troponin I (High Sensitivity): 3399 ng/L (ref <18)

## 2021-04-14 LAB — MAGNESIUM: Magnesium: 1.9 mg/dL (ref 1.7–2.4)

## 2021-04-14 MED ORDER — OSMOLITE 1.2 CAL PO LIQD
1000.0000 mL | ORAL | Status: AC
  Filled 2021-04-14: qty 1000

## 2021-04-14 MED ORDER — SODIUM CHLORIDE 0.9% FLUSH
3.0000 mL | Freq: Two times a day (BID) | INTRAVENOUS | Status: DC
  Administered 2021-04-14: 3 mL via INTRAVENOUS

## 2021-04-14 MED ORDER — NITROGLYCERIN 0.4 MG SL SUBL: 0.4000 mg | SUBLINGUAL_TABLET | Freq: Once | SUBLINGUAL | Status: DC

## 2021-04-14 MED ORDER — INSULIN ASPART 100 UNIT/ML IJ SOLN: 0.0000 [IU] | Freq: Every day | INTRAMUSCULAR | Status: DC

## 2021-04-14 MED ORDER — ACETAMINOPHEN 650 MG RE SUPP: 650.0000 mg | Freq: Four times a day (QID) | RECTAL | Status: DC | PRN

## 2021-04-14 MED ORDER — POLYETHYLENE GLYCOL 3350 17 G PO PACK: 17.0000 g | PACK | Freq: Every day | ORAL | Status: DC | PRN

## 2021-04-14 MED ORDER — BISACODYL 10 MG RE SUPP: 10.0000 mg | Freq: Every day | RECTAL | Status: DC | PRN

## 2021-04-14 MED ORDER — POTASSIUM CHLORIDE CRYS ER 20 MEQ PO TBCR: 40.0000 meq | EXTENDED_RELEASE_TABLET | ORAL | Status: AC

## 2021-04-14 MED ORDER — SODIUM CHLORIDE 0.9% FLUSH: 3.0000 mL | Freq: Two times a day (BID) | INTRAVENOUS | Status: DC

## 2021-04-14 MED ORDER — DOCUSATE SODIUM 50 MG/5ML PO LIQD
100.0000 mg | Freq: Two times a day (BID) | ORAL | Status: DC
  Administered 2021-04-16: 100 mg
  Filled 2021-04-14 (×7): qty 10

## 2021-04-14 MED ORDER — POTASSIUM CHLORIDE 20 MEQ PO PACK
20.0000 meq | PACK | Freq: Every day | ORAL | Status: DC
  Administered 2021-04-14 – 2021-04-15 (×2): 20 meq
  Filled 2021-04-14 (×2): qty 1

## 2021-04-14 MED ORDER — ONDANSETRON HCL 4 MG PO TABS: 4.0000 mg | ORAL_TABLET | Freq: Four times a day (QID) | ORAL | Status: DC | PRN

## 2021-04-14 MED ORDER — ATORVASTATIN CALCIUM 40 MG PO TABS
40.0000 mg | ORAL_TABLET | Freq: Every day | ORAL | Status: DC
  Administered 2021-04-15: 40 mg via ORAL
  Filled 2021-04-14: qty 1

## 2021-04-14 MED ORDER — ASPIRIN 81 MG PO CHEW
324.0000 mg | CHEWABLE_TABLET | Freq: Once | ORAL | Status: AC
  Administered 2021-04-14: 324 mg via ORAL
  Filled 2021-04-14: qty 4

## 2021-04-14 MED ORDER — SODIUM CHLORIDE 0.9% FLUSH
3.0000 mL | Freq: Two times a day (BID) | INTRAVENOUS | Status: DC
  Administered 2021-04-15: 3 mL via INTRAVENOUS

## 2021-04-14 MED ORDER — SODIUM CHLORIDE 0.9 % IV SOLN
250.0000 mL | INTRAVENOUS | Status: DC | PRN
Start: 2021-04-14 — End: 2021-04-15

## 2021-04-14 MED ORDER — ASPIRIN 325 MG PO TABS: 325.0000 mg | ORAL_TABLET | Freq: Every day | ORAL | Status: DC

## 2021-04-14 MED ORDER — METOPROLOL TARTRATE 25 MG/10 ML ORAL SUSPENSION
50.0000 mg | Freq: Two times a day (BID) | ORAL | Status: DC
  Administered 2021-04-14 – 2021-04-16 (×4): 50 mg
  Filled 2021-04-14 (×7): qty 20

## 2021-04-14 MED ORDER — IOHEXOL 350 MG/ML SOLN
100.0000 mL | Freq: Once | INTRAVENOUS | Status: AC | PRN
  Administered 2021-04-14: 80 mL via INTRAVENOUS

## 2021-04-14 MED ORDER — ADULT MULTIVITAMIN W/MINERALS CH: 1.0000 | ORAL_TABLET | Freq: Every day | ORAL | Status: DC

## 2021-04-14 MED ORDER — HEPARIN (PORCINE) 25000 UT/250ML-% IV SOLN
1750.0000 [IU]/h | INTRAVENOUS | Status: DC
  Administered 2021-04-14: 1300 [IU]/h via INTRAVENOUS
  Administered 2021-04-15: 1650 [IU]/h via INTRAVENOUS
  Filled 2021-04-14 (×2): qty 250

## 2021-04-14 MED ORDER — INSULIN ASPART 100 UNIT/ML IJ SOLN
0.0000 [IU] | Freq: Three times a day (TID) | INTRAMUSCULAR | Status: DC
  Administered 2021-04-15: 2 [IU] via SUBCUTANEOUS
  Administered 2021-04-15: 1 [IU] via SUBCUTANEOUS
  Administered 2021-04-15: 2 [IU] via SUBCUTANEOUS
  Administered 2021-04-16: 1 [IU] via SUBCUTANEOUS

## 2021-04-14 MED ORDER — HEPARIN BOLUS VIA INFUSION
4000.0000 [IU] | Freq: Once | INTRAVENOUS | Status: AC
  Administered 2021-04-14: 4000 [IU] via INTRAVENOUS

## 2021-04-14 MED ORDER — OMEPRAZOLE MAGNESIUM 10 MG PO PACK: 20.0000 mg | PACK | Freq: Every day | ORAL | Status: DC

## 2021-04-14 MED ORDER — ACETAMINOPHEN 325 MG PO TABS: 650.0000 mg | ORAL_TABLET | Freq: Four times a day (QID) | ORAL | Status: DC | PRN

## 2021-04-14 MED ORDER — OSMOLITE 1.2 CAL PO LIQD
50.0000 mL | ORAL | Status: DC
  Filled 2021-04-14 (×8): qty 237

## 2021-04-14 MED ORDER — ONDANSETRON HCL 4 MG/2ML IJ SOLN: 4.0000 mg | Freq: Four times a day (QID) | INTRAMUSCULAR | Status: DC | PRN

## 2021-04-14 MED ORDER — ONDANSETRON 4 MG PO TBDP: 4.0000 mg | ORAL_TABLET | Freq: Four times a day (QID) | ORAL | Status: DC | PRN

## 2021-04-14 MED ORDER — BENEPROTEIN PO POWD
1.0000 | ORAL | Status: DC
  Filled 2021-04-14: qty 227

## 2021-04-14 MED ORDER — FREE WATER
60.0000 mL | Freq: Three times a day (TID) | Status: DC
  Administered 2021-04-14 – 2021-04-16 (×3): 60 mL

## 2021-04-14 MED ORDER — LOSARTAN POTASSIUM 25 MG PO TABS
25.0000 mg | ORAL_TABLET | Freq: Every day | ORAL | Status: DC
  Filled 2021-04-14: qty 1

## 2021-04-14 MED ORDER — MORPHINE SULFATE (PF) 2 MG/ML IV SOLN
2.0000 mg | INTRAVENOUS | Status: DC | PRN
Start: 2021-04-14 — End: 2021-04-15

## 2021-04-14 MED ORDER — SODIUM CHLORIDE 0.9% FLUSH: 3.0000 mL | INTRAVENOUS | Status: DC | PRN

## 2021-04-14 MED ORDER — PANTOPRAZOLE SODIUM 40 MG PO PACK
40.0000 mg | PACK | Freq: Every day | ORAL | Status: DC
  Administered 2021-04-14 – 2021-04-16 (×3): 40 mg
  Filled 2021-04-14 (×4): qty 20

## 2021-04-14 MED ORDER — HEPARIN BOLUS VIA INFUSION
3000.0000 [IU] | Freq: Once | INTRAVENOUS | Status: AC
  Administered 2021-04-15: 3000 [IU] via INTRAVENOUS
  Filled 2021-04-14: qty 3000

## 2021-04-14 MED ORDER — MORPHINE SULFATE (PF) 4 MG/ML IV SOLN
4.0000 mg | Freq: Once | INTRAVENOUS | Status: AC
  Administered 2021-04-14: 4 mg via INTRAVENOUS
  Filled 2021-04-14: qty 1

## 2021-04-14 MED ORDER — ADULT MULTIVITAMIN LIQUID CH: 15.0000 mL | Freq: Every day | ORAL | Status: DC

## 2021-04-14 MED ORDER — PROSOURCE TF PO LIQD
45.0000 mL | Freq: Two times a day (BID) | ORAL | Status: DC
  Administered 2021-04-14: 45 mL
  Filled 2021-04-14 (×5): qty 45

## 2021-04-14 MED ORDER — ONDANSETRON HCL 4 MG/2ML IJ SOLN
4.0000 mg | Freq: Once | INTRAMUSCULAR | Status: AC
  Administered 2021-04-14: 4 mg via INTRAVENOUS
  Filled 2021-04-14: qty 2

## 2021-04-14 MED ORDER — ACETAMINOPHEN 160 MG/5ML PO SOLN: 650.0000 mg | Freq: Four times a day (QID) | ORAL | Status: DC | PRN

## 2021-04-14 NOTE — ED Notes (Signed)
Date and time results received: 04/14/21 1450  Test: Trop Critical Value: 192  Name of Provider Notified: Kommor  Instructed to repeat ekg, done and given to md.

## 2021-04-14 NOTE — ED Provider Notes (Signed)
4:16 PM Patient is pain-free in regards to his chest and arm.  No abdominal complaints or hematemesis.  Discussed with Dr. Carolynne Edouard given his NSTEMI and recent gastric bypass and he states that given he is more than just a couple days out he is fine to be anticoagulated.  We will consult cardiology for admission.  4:30 PM I discussed with Dr. Bjorn Pippin. Agrees with heparin. Admit to cardiology.  5:04 PM Dr. Bjorn Pippin asks for patient to be medically admitted to hospitalist service and they will take care of CAD/NSTEMI given his significant co-morbidities.   5:27 PM Dr. Mariea Clonts will admit.  CRITICAL CARE Performed by: Audree Camel   Total critical care time: 35 minutes  Critical care time was exclusive of separately billable procedures and treating other patients.  Critical care was necessary to treat or prevent imminent or life-threatening deterioration.  Critical care was time spent personally by me on the following activities: development of treatment plan with patient and/or surrogate as well as nursing, discussions with consultants, evaluation of patient's response to treatment, examination of patient, obtaining history from patient or surrogate, ordering and performing treatments and interventions, ordering and review of laboratory studies, ordering and review of radiographic studies, pulse oximetry and re-evaluation of patient's condition.    Pricilla Loveless, MD 04/14/21 [phone]

## 2021-04-14 NOTE — ED Notes (Signed)
Zara RN for report

## 2021-04-14 NOTE — Progress Notes (Signed)
ANTICOAGULATION CONSULT NOTE   Pharmacy Consult for Heparin Indication: chest pain/ACS  Allergies  Allergen Reactions   Other Other (See Comments)    Cats    Patient Measurements: Height: 6\' 2"  (188 cm) Weight: 120.2 kg (265 lb) IBW/kg (Calculated) : 82.2 HEPARIN DW (KG): 108   Vital Signs: Temp: 98.4 F (36.9 C) (08/21 1952) Temp Source: Oral (08/21 1952) BP: 152/92 (08/21 2100) Pulse Rate: 79 (08/21 2100)  Labs: Recent Labs    04/14/21 1222 04/14/21 1358 04/14/21 1612 04/14/21 2306  HGB 15.4  --   --   --   HCT 47.3  --   --   --   PLT 231  --   --   --   HEPARINUNFRC  --   --   --  0.10*  CREATININE 0.68  --   --   --   TROPONINIHS 7 192* 3,399*  --      Estimated Creatinine Clearance: 138.7 mL/min (by C-G formula based on SCr of 0.68 mg/dL).   Medical History: Past Medical History:  Diagnosis Date   Allergic rhinitis    Atrial fibrillation (HCC)    Coronary artery disease, non-occlusive    a. 05/08/2015 Cath: no significant CAD, LVEF nl-->Med; b. 07/2017 MV: attenuation artifact, no ischemia, EF 65%-->Low risk.   Diabetes mellitus without complication (HCC)    Diastolic dysfunction    a. 04/2015 Echo: EF 60-65%, mild MR, mildly dil LA, nl RV fxn, nl PASP; b. 05/2016 Echo: EF 60-65%, no rmwa, Gr1 DD, mildly dil Ao root and asc Ao; c. 07/2017 Echo: EF 55-60%, Ao root 40mm. mildly dil LA; d. 03/2019 Echo: EF 55-60%, nl RV fxn. Mild LAE, Ao root/Asc Ao 71mm.   Dilated aortic root (HCC)    a. 07/2017 Echo: 66mm Ao root - mildly dil; b. 03/2019 Echo: Ao root 69mm.   Diverticulosis 10/03/2014   Dysrhythmia    Family history of premature CAD    a. father passed from MI at 37   GERD (gastroesophageal reflux disease)    Hemorrhoids    a. internal hemorrhoids s/p surgery 1999   History of kidney stones    History of tobacco abuse    Hyperlipidemia    Hyperplastic colon polyp 10/03/2014   a. x 2    Hypertension    Inflammatory arthritis    a. CCP antibodies &  x-rays negative. Rheumatoid factor 14, felt to be crystaline over RA or psoriatic   Morbid obesity (HCC)    a. s/p LAP-BAND   OSA (obstructive sleep apnea)    a. on CPAP   Osteoarthritis    PSVT (paroxysmal supraventricular tachycardia) (HCC)    a. 48 hr Holter 04/2015: NSR w/ rare PVC, short runs of narrow complex tachycardiac, possible atrial tach, longest run 7 beats, PACs noted (2% of all beats 3600 total) they did not seem to correlate w/ significant arrythmia; b. 08/2017 Event monitor: no significant arrhythmias.    Assessment: Patient presented to ED with chest pain. Also, patient with recent gastric bypass at end of June, MD says okay to anticoagulate. Patient is not on any oral anticoagulants. Elevated troponin, pharmacy asked to start heparin.  Heparin level subtherapeutic (0.1) on gtt at 1300 units/hr. No issues with line or bleeding reported per RN. Plan for cath tomorrow.  Goal of Therapy:  Heparin level 0.3-0.7 units/ml Monitor platelets by anticoagulation protocol: Yes   Plan:  Rebolus heparin 3000 units Increase heparin gtt to 1650 units/hr F/u 6 hr  heparin level  Christoper Fabian, PharmD, BCPS Please see amion for complete clinical pharmacist phone list 04/14/2021,11:55 PM

## 2021-04-14 NOTE — Progress Notes (Signed)
ANTICOAGULATION CONSULT NOTE - Initial Consult  Pharmacy Consult for Heparin Indication: chest pain/ACS  Allergies  Allergen Reactions   Other Other (See Comments)    Cats    Patient Measurements: Height: 6\' 2"  (188 cm) Weight: 120.2 kg (265 lb) IBW/kg (Calculated) : 82.2 HEPARIN DW (KG): 108   Vital Signs: Temp: 98 F (36.7 C) (08/21 1244) Temp Source: Oral (08/21 1244) BP: 144/94 (08/21 1600) Pulse Rate: 73 (08/21 1600)  Labs: Recent Labs    04/14/21 1222 04/14/21 1358  HGB 15.4  --   HCT 47.3  --   PLT 231  --   CREATININE 0.68  --   TROPONINIHS 7 192*    Estimated Creatinine Clearance: 138.7 mL/min (by C-G formula based on SCr of 0.68 mg/dL).   Medical History: Past Medical History:  Diagnosis Date   Allergic rhinitis    Atrial fibrillation (HCC)    Coronary artery disease, non-occlusive    a. 05/08/2015 Cath: no significant CAD, LVEF nl-->Med; b. 07/2017 MV: attenuation artifact, no ischemia, EF 65%-->Low risk.   Diabetes mellitus without complication (HCC)    Diastolic dysfunction    a. 04/2015 Echo: EF 60-65%, mild MR, mildly dil LA, nl RV fxn, nl PASP; b. 05/2016 Echo: EF 60-65%, no rmwa, Gr1 DD, mildly dil Ao root and asc Ao; c. 07/2017 Echo: EF 55-60%, Ao root 30mm. mildly dil LA; d. 03/2019 Echo: EF 55-60%, nl RV fxn. Mild LAE, Ao root/Asc Ao 46mm.   Dilated aortic root (HCC)    a. 07/2017 Echo: 61mm Ao root - mildly dil; b. 03/2019 Echo: Ao root 38mm.   Diverticulosis 10/03/2014   Dysrhythmia    Family history of premature CAD    a. father passed from MI at 3   GERD (gastroesophageal reflux disease)    Hemorrhoids    a. internal hemorrhoids s/p surgery 1999   History of kidney stones    History of tobacco abuse    Hyperlipidemia    Hyperplastic colon polyp 10/03/2014   a. x 2    Hypertension    Inflammatory arthritis    a. CCP antibodies & x-rays negative. Rheumatoid factor 14, felt to be crystaline over RA or psoriatic   Morbid obesity (HCC)     a. s/p LAP-BAND   OSA (obstructive sleep apnea)    a. on CPAP   Osteoarthritis    PSVT (paroxysmal supraventricular tachycardia) (HCC)    a. 48 hr Holter 04/2015: NSR w/ rare PVC, short runs of narrow complex tachycardiac, possible atrial tach, longest run 7 beats, PACs noted (2% of all beats 3600 total) they did not seem to correlate w/ significant arrythmia; b. 08/2017 Event monitor: no significant arrhythmias.    Medications:  See med rec  Assessment: Patient presented to ED with chest pain. Also, patient with recent gastric bypass at end of June, MD says okay to anticoagulate. Patient is not on any oral anticoagulants. Elevated troponin, pharmacy asked to start heparin.  Goal of Therapy:  Heparin level 0.3-0.7 units/ml Monitor platelets by anticoagulation protocol: Yes   Plan:  Give 4000 units bolus x 1 Start heparin infusion at 1300 units/hr Check anti-Xa level in ~6 hours and daily while on heparin Continue to monitor H&H and platelets  July, BS Elder Cyphers, BCPS Clinical Pharmacist Pager [phone]  04/14/2021,4:26 PM

## 2021-04-14 NOTE — ED Notes (Signed)
Report called to [facility] for transport 

## 2021-04-14 NOTE — Consult Note (Addendum)
Cardiology Consult    Patient ID: Hector Curry MRN: [id number], DOB/AGE: 03/21/1961   Admit date: 04/14/2021 Date of Consult: 04/14/2021 Requesting Provider: Arelia Longestourage Empokpae, MD  PCP:  Marguarite ArbourSparks, Jeffrey D, MD   [facility] Providers Cardiologist:  Julien Nordmannimothy Gollan, MD       Patient Profile    Hector Curry is a 60 y.o. male with a history of obesity, OSA, pAF not on anticoagualtion, HLD, T2DM presenting with chest pain and elevated troponin on whom we are consulted for the findings.  History of Present Illness   60 year old male with history as above who underwent recent, difficult laparoscopic conversion of an adjustable gastric band to gastric bypass on 02/18/21 with subsequent GJ anastamosis stricture requriing subsequent remnant gastrostomy tube on 03/20/21 who presents with postcoital chest pain.  He reports that afterwards this evening he had an episode of searing substernal chest pain with pain radiating down into his right arm.  He felt pain in his hand and biceps, cramping quality.  He took two baby aspirin and presented to the ED.  After consultation with surgery, he was cleared for ASA/heparin and started on heparin infusion with full dose aspirin.  He has had mild twinges of chest pain but none at the time of my evaluation and nothing like this evening.  No shortness of breath, transient loss of consciousness, orthopnea, PND, palpitations.  He reports a history of AF but is not on anticoagulation, presumably due to low burden (was on none prior to surgery).  His father had a fatal MI at age 59.  He has a 20 pack year history but is well remote from that.  With regard to his postsurgical complications, there are plans for endoscopic dilation of his gastroesophageal stricture and he remains dependent on his gastrostomy tube.  Past Medical History   Past Medical History:  Diagnosis Date   Allergic rhinitis    Atrial fibrillation (HCC)    Coronary artery disease,  non-occlusive    a. 05/08/2015 Cath: no significant CAD, LVEF nl-->Med; b. 07/2017 MV: attenuation artifact, no ischemia, EF 65%-->Low risk.   Diabetes mellitus without complication (HCC)    Diastolic dysfunction    a. 04/2015 Echo: EF 60-65%, mild MR, mildly dil LA, nl RV fxn, nl PASP; b. 05/2016 Echo: EF 60-65%, no rmwa, Gr1 DD, mildly dil Ao root and asc Ao; c. 07/2017 Echo: EF 55-60%, Ao root 42mm. mildly dil LA; d. 03/2019 Echo: EF 55-60%, nl RV fxn. Mild LAE, Ao root/Asc Ao 42mm.   Dilated aortic root (HCC)    a. 07/2017 Echo: 42mm Ao root - mildly dil; b. 03/2019 Echo: Ao root 42mm.   Diverticulosis 10/03/2014   Dysrhythmia    Family history of premature CAD    a. father passed from MI at 4649   GERD (gastroesophageal reflux disease)    Hemorrhoids    a. internal hemorrhoids s/p surgery 1999   History of kidney stones    History of tobacco abuse    Hyperlipidemia    Hyperplastic colon polyp 10/03/2014   a. x 2    Hypertension    Inflammatory arthritis    a. CCP antibodies & x-rays negative. Rheumatoid factor 14, felt to be crystaline over RA or psoriatic   Morbid obesity (HCC)    a. s/p LAP-BAND   OSA (obstructive sleep apnea)    a. on CPAP   Osteoarthritis    PSVT (paroxysmal supraventricular tachycardia) (HCC)    a. 48 hr  Holter 04/2015: NSR w/ rare PVC, short runs of narrow complex tachycardiac, possible atrial tach, longest run 7 beats, PACs noted (2% of all beats 3600 total) they did not seem to correlate w/ significant arrythmia; b. 08/2017 Event monitor: no significant arrhythmias.    Past Surgical History:  Procedure Laterality Date   BARIATRIC SURGERY     lap band    CARDIAC CATHETERIZATION N/A 05/08/2015   Procedure: Left Heart Cath and Coronary Angiography;  Surgeon: Corky Crafts, MD;  Location: [facility] INVASIVE CV LAB;  Service: Cardiovascular;  Laterality: N/A;   CARPAL TUNNEL RELEASE Left    CHOLECYSTECTOMY     COLONOSCOPY  06/27/2005   COLONOSCOPY  10/03/2014    ESOPHAGOGASTRODUODENOSCOPY  06/27/2005   ESOPHAGOGASTRODUODENOSCOPY N/A 03/19/2021   Procedure: ESOPHAGOGASTRODUODENOSCOPY (EGD);  Surgeon: Rodman Pickle, MD;  Location: [facility];  Service: General;  Laterality: N/A;   GASTRIC ROUX-EN-Y N/A 02/18/2021   Procedure: LAPAROSCOPIC ROUX-EN-Y GASTRIC BYPASS WITH UPPER ENDOSCOPY,;  Surgeon: Rodman Pickle, MD;  Location: [facility];  Service: General;  Laterality: N/A;   HEMORRHOID SURGERY  08/25/1997   IR GJ TUBE CHANGE  03/27/2021   LAPAROSCOPIC INSERTION GASTROSTOMY TUBE N/A 03/20/2021   Procedure: LAPAROSCOPIC INSERTION GASTROSTOMY TUBE;  Surgeon: Rodman Pickle, MD;  Location: [facility];  Service: General;  Laterality: N/A;   MOUTH SURGERY     removed area which was benign   TONSILLECTOMY     UPPER GI ENDOSCOPY N/A 03/20/2021   Procedure: UPPER GI ENDOSCOPY;  Surgeon: Rodman Pickle, MD;  Location: [facility];  Service: General;  Laterality: N/A;     Allergies  Allergen Reactions   Other Other (See Comments)    Cats   Inpatient Medications     [START ON 04/15/2021] aspirin  325 mg Per Tube Daily   atorvastatin  40 mg Oral q1800   docusate  100 mg Per Tube BID   feeding supplement (PROSource TF)  45 mL Per Tube BID   free water  60 mL Per Tube Q8H   insulin aspart  0-5 Units Subcutaneous QHS   [START ON 04/15/2021] insulin aspart  0-9 Units Subcutaneous TID WC   metoprolol tartrate  50 mg Per Tube BID   [START ON 04/15/2021] multivitamin with minerals  1 tablet Oral Daily   nitroGLYCERIN  0.4 mg Sublingual Once   pantoprazole sodium  40 mg Per Tube Daily   potassium chloride  20 mEq Per Tube Daily   potassium chloride  40 mEq Oral Q3H   [START ON 04/16/2021] protein supplement  1 Scoop Per Tube Q3H while awake   sodium chloride flush  3 mL Intravenous Q12H   sodium chloride flush  3 mL Intravenous Q12H   sodium chloride flush  3 mL Intravenous Q12H   sodium chloride flush  3 mL Intravenous Q12H    Family History     Family History  Problem Relation Age of Onset   Hypertension Mother    Diabetes Mother    Heart attack Father 36   CAD Father    Hypertension Sister    Diabetes Other    Hypertension Other    Heart disease Other    He indicated that his mother is alive. He indicated that his father is deceased. He indicated that his sister is alive.   Social History    Social History   Socioeconomic History   Marital status: Married    Spouse name: Not on file   Number of children:  Not on file   Years of education: Not on file   Highest education level: Not on file  Occupational History   Not on file  Tobacco Use   Smoking status: Former    Packs/day: 1.00    Years: 15.00    Pack years: 15.00    Types: Cigarettes    Quit date: 08/05/1989    Years since quitting: 31.7   Smokeless tobacco: Never  Vaping Use   Vaping Use: Never used  Substance and Sexual Activity   Alcohol use: Never   Drug use: Never   Sexual activity: Not on file  Other Topics Concern   Not on file  Social History Narrative   Not on file   Social Determinants of Health   Financial Resource Strain: Not on file  Food Insecurity: Not on file  Transportation Needs: Not on file  Physical Activity: Not on file  Stress: Not on file  Social Connections: Not on file  Intimate Partner Violence: Not on file     Review of Systems    General:  No chills, fever, night sweats or weight changes.  Cardiovascular:  No chest pain, dyspnea on exertion, edema, orthopnea, palpitations, paroxysmal nocturnal dyspnea. Dermatological: No rash, lesions/masses Respiratory: No cough, dyspnea Urologic: No hematuria, dysuria Abdominal:   No nausea, vomiting, diarrhea, bright red blood per rectum, melena, or hematemesis Neurologic:  No visual changes, wkns, changes in mental status. All other systems reviewed and are otherwise negative except as noted above.  Physical Exam    Blood pressure (!) 152/92, pulse 79, temperature  98.4 F (36.9 C), temperature source Oral, resp. rate (!) 24, height  (1.88 m), weight 120.2 kg, SpO2 94 %.    No intake or output data in the 24 hours ending 04/14/21 2200 Wt Readings from Last 3 Encounters:  04/14/21 120.2 kg  03/27/21 132.6 kg  03/25/21 132.6 kg   BP R arm 148/92, L arm 152/88  CONSTITUTIONAL: alert and conversant, well-appearing, nourished, no distress, obese abdomen HEENT: normal NECK: no JVD, no masses CARDIAC: Regular rhythm. Normal S1/S2, no S3/S4. No murmur. No friction rub. JVP flat  VASCULAR: Radial pulses intact bilaterally. No carotid bruits. PULMONARY/CHEST WALL: no deformities, normal breath sounds bilaterally, normal work of breathing ABDOMINAL: soft, non-tender, non-distended, G-tube exit site clean dry intact EXTREMITIES: no edema, no muscle atrophy, warm and well-perfused SKIN: Dry and intact without apparent rashes or wounds. No peripheral cyanosis. NEUROLOGIC: alert, no abnormal movements, cranial nerves grossly intact. PSYCH: normal affect, normal speech and language   Labs    Troponin 7 --> 192 --> 3400 Normal chemistries Normal hematology LDL 101 hdl 20 A1C 7.3%  Radiology Studies    DG Chest 2 View  Result Date: 04/14/2021 CLINICAL DATA:  Right-sided chest pain. EXAM: CHEST - 2 VIEW COMPARISON:  11/08/2019 FINDINGS: The heart size and mediastinal contours are within normal limits. Both lungs are clear. The visualized skeletal structures are unremarkable. IMPRESSION: No active cardiopulmonary disease. Electronically Signed   By: Danae Orleans M.D.   On: 04/14/2021 13:06   DG Abd 1 View  Result Date: 03/26/2021 CLINICAL DATA:  Abdominal pain and nausea with blocked peg tube. EXAM: ABDOMEN - 1 VIEW COMPARISON:  March 09, 2021 FINDINGS: The bowel gas pattern is normal. A percutaneous gastrostomy tube is seen with its distal tip and insufflator bulb overlying the expected region of the body of the stomach. Radiopaque contrast is noted  throughout the lumen of the PEG tube. Ill-defined surgical  sutures are seen overlying the mid right abdomen with radiopaque surgical clips overlying the right upper quadrant. No radio-opaque calculi or other significant radiographic abnormality are seen. IMPRESSION: Percutaneous gastrostomy tube, as described above, without evidence of bowel obstruction. Electronically Signed   By: Aram Candela M.D.   On: 03/26/2021 20:23   CT Chest W Contrast  Result Date: 04/14/2021 CLINICAL DATA:  Status post Roux-en-Y gastric bypass NG tube placement, chest pain, vomiting, concern for surgical complication EXAM: CT CHEST AND ABDOMEN WITH CONTRAST TECHNIQUE: Multidetector CT imaging of the chest and abdomen was performed following the standard protocol during bolus administration of intravenous contrast. CONTRAST:  55mL OMNIPAQUE IOHEXOL 350 MG/ML SOLN, additional oral enteric contrast COMPARISON:  None. FINDINGS: CT CHEST FINDINGS Cardiovascular: Scattered aortic atherosclerosis. Normal heart size. Three-vessel coronary artery calcifications. No pericardial effusion. Mediastinum/Nodes: No enlarged mediastinal, hilar, or axillary lymph nodes. Thyroid gland, trachea, and esophagus demonstrate no significant findings. Lungs/Pleura: Mild, diffuse bilateral bronchial wall thickening. Background of very fine centrilobular nodules throughout the lungs, most concentrated in the apices. No pleural effusion or pneumothorax. Musculoskeletal: No chest wall mass or suspicious bone lesions identified. CT ABDOMEN FINDINGS Hepatobiliary: No focal liver abnormality is seen. Somewhat coarse, nodular contour of the liver. Status post cholecystectomy. No biliary ductal dilatation. Pancreas: Unremarkable. No pancreatic ductal dilatation or surrounding inflammatory changes. Spleen: Normal in size without focal abnormality. Adrenals/Urinary Tract: Adrenal glands are unremarkable. Kidneys are normal, without renal calculi, focal lesion, or  hydronephrosis. Bladder is unremarkable. Stomach/Bowel: Postoperative findings of Roux-en-Y type gastric bypass with additional percutaneous gastrostomy of the gastric remnant. The gastric remnant is fluid-filled although not distended. Appendix appears normal. No evidence of bowel wall thickening, distention, or inflammatory changes. Vascular/Lymphatic: Aortic atherosclerosis. No enlarged abdominal or pelvic lymph nodes. Other: No abdominal wall hernia or abnormality. Minimal residua of a prior right rectus sheath hematoma (series 2, image 80) no abdominopelvic ascites. Musculoskeletal: No acute or significant osseous findings. IMPRESSION: 1. Postoperative findings of Roux-en-Y type gastric bypass with additional percutaneous gastrostomy of the gastric remnant. The gastric remnant is fluid-filled although not distended. 2. No evidence of bowel obstruction or other surgical complication. 3. Somewhat coarse, nodular contour of the liver, suggestive of cirrhosis. 4. Background of very fine centrilobular nodules throughout the lungs, most concentrated in the apices, most consistent with smoking-related respiratory bronchiolitis. 5. Minimal residua of a prior right rectus sheath hematoma. 6. Coronary artery disease. Aortic Atherosclerosis (ICD10-I70.0). Electronically Signed   By: Lauralyn Primes M.D.   On: 04/14/2021 15:22   CT ABDOMEN W CONTRAST  Result Date: 04/14/2021 CLINICAL DATA:  Status post Roux-en-Y gastric bypass NG tube placement, chest pain, vomiting, concern for surgical complication EXAM: CT CHEST AND ABDOMEN WITH CONTRAST TECHNIQUE: Multidetector CT imaging of the chest and abdomen was performed following the standard protocol during bolus administration of intravenous contrast. CONTRAST:  45mL OMNIPAQUE IOHEXOL 350 MG/ML SOLN, additional oral enteric contrast COMPARISON:  None. FINDINGS: CT CHEST FINDINGS Cardiovascular: Scattered aortic atherosclerosis. Normal heart size. Three-vessel coronary artery  calcifications. No pericardial effusion. Mediastinum/Nodes: No enlarged mediastinal, hilar, or axillary lymph nodes. Thyroid gland, trachea, and esophagus demonstrate no significant findings. Lungs/Pleura: Mild, diffuse bilateral bronchial wall thickening. Background of very fine centrilobular nodules throughout the lungs, most concentrated in the apices. No pleural effusion or pneumothorax. Musculoskeletal: No chest wall mass or suspicious bone lesions identified. CT ABDOMEN FINDINGS Hepatobiliary: No focal liver abnormality is seen. Somewhat coarse, nodular contour of the liver. Status post cholecystectomy. No biliary ductal dilatation.  Pancreas: Unremarkable. No pancreatic ductal dilatation or surrounding inflammatory changes. Spleen: Normal in size without focal abnormality. Adrenals/Urinary Tract: Adrenal glands are unremarkable. Kidneys are normal, without renal calculi, focal lesion, or hydronephrosis. Bladder is unremarkable. Stomach/Bowel: Postoperative findings of Roux-en-Y type gastric bypass with additional percutaneous gastrostomy of the gastric remnant. The gastric remnant is fluid-filled although not distended. Appendix appears normal. No evidence of bowel wall thickening, distention, or inflammatory changes. Vascular/Lymphatic: Aortic atherosclerosis. No enlarged abdominal or pelvic lymph nodes. Other: No abdominal wall hernia or abnormality. Minimal residua of a prior right rectus sheath hematoma (series 2, image 80) no abdominopelvic ascites. Musculoskeletal: No acute or significant osseous findings. IMPRESSION: 1. Postoperative findings of Roux-en-Y type gastric bypass with additional percutaneous gastrostomy of the gastric remnant. The gastric remnant is fluid-filled although not distended. 2. No evidence of bowel obstruction or other surgical complication. 3. Somewhat coarse, nodular contour of the liver, suggestive of cirrhosis. 4. Background of very fine centrilobular nodules throughout the  lungs, most concentrated in the apices, most consistent with smoking-related respiratory bronchiolitis. 5. Minimal residua of a prior right rectus sheath hematoma. 6. Coronary artery disease. Aortic Atherosclerosis (ICD10-I70.0). Electronically Signed   By: Lauralyn Primes M.D.   On: 04/14/2021 15:22   IR GJ Tube Change  Result Date: 03/27/2021 INDICATION: 60 year old male male with clogged gastrostomy tube. EXAM: FLUOROSCOPIC GUIDED REPLACEMENT OF GASTROSTOMY TUBE COMPARISON:  KUB, 03/26/2021. MEDICATIONS: None. CONTRAST:  23mL OMNIPAQUE IOHEXOL 300 MG/ML SOLN - administered into the gastric lumen FLUOROSCOPY TIME:  0 minutes, 36 seconds.  Dose: 12 mGy. COMPLICATIONS: None immediate. PROCEDURE: Informed written consent was obtained from the patient after a discussion of the risks, benefits and alternatives to treatment. Questions regarding the procedure were encouraged and answered. A timeout was performed prior to the initiation of the procedure. The upper abdomen and external portion of the existing gastrostomy tube was prepped and draped in the usual sterile fashion, and a sterile drape was applied covering the operative field. Maximum barrier sterile technique with sterile gowns and gloves were used for the procedure. A timeout was performed prior to the initiation of the procedure. The existing gastrostomy tube was clogged and therefore contrast injection confirming appropriate positioning within the gastric lumen could not be performed. Next, the gastrostomy tube was cannulated with a stiff Glidewire which was coiled within the gastric fundus. The retention balloon was deflated. Over the stiff guide wire, the existing gastrostomy tube was exchanged for a new 18-French balloon inflatable gastrostomy tube. The balloon was inflated with saline and dilute contrast and pulled against the anterior inner lumen of the stomach and the external disc was cinched. Contrast was injected and a post procedural spot  fluoroscopic image was obtained confirming appropriate positioning and functionality of the new gastrostomy tube. A dressing was applied. The patient tolerated the procedure well without immediate postprocedural complication. IMPRESSION: Successful fluoroscopic guided replacement of a new 18-French gastrostomy tube. The gastrostomy tube is ready for immediate use. Roanna Banning, MD [facility] Radiology Electronically Signed   By: Roanna Banning MD   On: 03/27/2021 16:22    ECG & Cardiac Imaging    I have personally reviewed the studies.  Presenting ECG reviewed, interpretation as follows: NSR with bifascicular block, some ST depression early ST segment V2, stable on serial EKGs.  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  TTE 2020 LVEF 55-60%, normal diastology, RV normal, mild LA dilation, normal valves.  Ascending aorta 42 mm.  LHC 9/16 for abnormal stress test ( for angina) without significant coronary artery disease.  Reviewed images, difficult penetration given size, mild luminal irregularities prox/mid LCx and LAD.  1/19 ambulatory monitor without arrhythmia.  Telemetry reviewed.  NSR aside from single episode 7 beats NSVT, monomorphic.  Assessment & Plan    60 year old male with extensive past medical history significant for T2DM, HTN, HLD, morbid obesity with recent complicated conversion lap band --> bypass with subsequent gastroesophageal stricture on whom we are consulted for chest pain and elevated troponin.  Presentation is consistent with acute coronary syndrome.  He has history of root dilation by echo in 2016 but has symmetric bilateral brachial pressures so low supsicion for dissection without ongoing chest pain.  Plan for GE junction revision and possible gastric surgery complicates management plan but, given initial troponin trend would favor an early invasive strategy with decision regarding necessity of  intervention based on anatomic findings.  Problem list NSTEMI NSVT Dilated aortic root pAF, low burden, not on anticoagulation T2DM HTN HLD Morbid obesity G-tube, gastroesophageal stricture  Recommendations NSTEMI - ASA 81 mg daily, heparin infusion ACS nomogram - TTE in AM - NPO @ MN for LHC - Monitor on telemetry - Notify me if recurrent chest pain, unstable arrhythmia, heart failure - Agree with uptitration metoprolol tartrate 50 mg BID - Start losartan 12.5 mg daily  NSVT - Metoprolol as above  Dilated aortic root - Review on TTE in AM, if decent size/incompletely evaluated should have CT.  pAF, low burden, not on anticoagulation - Hold home ditliazem (60 mg BID) in favor of metoprolol - Metoprolol as above - Heparin infusion as above.  If no recurrence this admission can repeat outpatient monitor in follow-up regarding risk stratification/anticoagulation now with higher C2V.  T2DM - Would benefit from SGLT2i  Risk Assessment/Risk Scores:    TIMI Risk Score for Unstable Angina or Non-ST Elevation MI:   The patient's TIMI risk score is 3, which indicates a 13% risk of all cause mortality, new or recurrent myocardial infarction or need for urgent revascularization in the next 14 days.{   CHA2DS2-VASc Score = 3  Signed, Harlon Ditty Kobi Mario, MD  04/14/2021 10:00 PM

## 2021-04-14 NOTE — ED Triage Notes (Signed)
Pt to er room number 7, pt states that he has been in the hospital recently for gastric bypass and a feeding tube, states that he is here for day from some R sided chest pain, states that the pain started after intercourse this am, states that it is a constant pain.  Resps even and unlabored.

## 2021-04-14 NOTE — ED Notes (Signed)
Pt remains pain free at this time.

## 2021-04-14 NOTE — ED Notes (Signed)
Patient transported to X-ray 

## 2021-04-14 NOTE — Progress Notes (Signed)
Pt has home unit on home settings. Pt places self on/off as needed.

## 2021-04-14 NOTE — ED Provider Notes (Signed)
[facility] EMERGENCY DEPARTMENT Provider Note   CSN: [id number] Arrival date & time: 04/14/21  1209     History Chief Complaint  Patient presents with   Chest Pain    Hector Curry is a 60 y.o. male with a PMH of recent gastric bypass at the end of June with feeding tube, DM2, HTN, hx of tobacco use who presents with of right sided chest pain with radiation into right arm. Patient describes the pain as burning, constant since onset. He took two aspirin at the time of onset without relief. He reports he was having sexual intercourse prior to symptom onset 8-10 minutes later. He denies viagra use. He reports he has had multiple complications from his surgery with obstruction, feeding tube clogs. Patient reports he has had issue with vomiting since his surgery, but none today. He denies shortness of breath, headache, back pain, vision changes, diarrhea, abdominal pain. Towards the end of the interview he reports that his chest pain is starting to resolve, but soreness in arm persists.   Chest Pain Associated symptoms: no abdominal pain, no back pain, no diaphoresis, no dizziness, no fever, no nausea, no palpitations, no shortness of breath, no vomiting and no weakness       Past Medical History:  Diagnosis Date   Allergic rhinitis    Atrial fibrillation (HCC)    Coronary artery disease, non-occlusive    a. 05/08/2015 Cath: no significant CAD, LVEF nl-->Med; b. 07/2017 MV: attenuation artifact, no ischemia, EF 65%-->Low risk.   Diabetes mellitus without complication (HCC)    Diastolic dysfunction    a. 04/2015 Echo: EF 60-65%, mild MR, mildly dil LA, nl RV fxn, nl PASP; b. 05/2016 Echo: EF 60-65%, no rmwa, Gr1 DD, mildly dil Ao root and asc Ao; c. 07/2017 Echo: EF 55-60%, Ao root 53mm. mildly dil LA; d. 03/2019 Echo: EF 55-60%, nl RV fxn. Mild LAE, Ao root/Asc Ao 35mm.   Dilated aortic root (HCC)    a. 07/2017 Echo: 69mm Ao root - mildly dil; b. 03/2019 Echo: Ao root 53mm.    Diverticulosis 10/03/2014   Dysrhythmia    Family history of premature CAD    a. father passed from MI at 32   GERD (gastroesophageal reflux disease)    Hemorrhoids    a. internal hemorrhoids s/p surgery 1999   History of kidney stones    History of tobacco abuse    Hyperlipidemia    Hyperplastic colon polyp 10/03/2014   a. x 2    Hypertension    Inflammatory arthritis    a. CCP antibodies & x-rays negative. Rheumatoid factor 14, felt to be crystaline over RA or psoriatic   Morbid obesity (HCC)    a. s/p LAP-BAND   OSA (obstructive sleep apnea)    a. on CPAP   Osteoarthritis    PSVT (paroxysmal supraventricular tachycardia) (HCC)    a. 48 hr Holter 04/2015: NSR w/ rare PVC, short runs of narrow complex tachycardiac, possible atrial tach, longest run 7 beats, PACs noted (2% of all beats 3600 total) they did not seem to correlate w/ significant arrythmia; b. 08/2017 Event monitor: no significant arrhythmias.    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   Morbid obesity (HCC) 02/18/2021   History of kidney stones 05/30/2016  Elevated LFTs 02/13/2016   Morbid obesity with BMI of 40.0-44.9, adult (HCC) 12/20/2015   Atrial tachycardia (HCC) 06/21/2015   Hypertriglyceridemia 05/15/2015   Coronary artery disease, non-occlusive    Type II diabetes mellitus, uncontrolled (HCC) 05/08/2015   Chest pain at rest    Hyperglycemia 05/07/2015   Chest pain 05/06/2015   GERD (gastroesophageal reflux disease)    Kidney stones    Hemorrhoids    Inflammatory arthritis    Family history of premature CAD    OSA (obstructive sleep apnea)    History of tobacco abuse    Palpitations    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/20/2014   Diverticulosis 10/03/2014   Hyperplastic colon  polyp 10/03/2014   HTN, goal below 140/90 08/07/2014   Pain in the chest 06/19/2014   SOB (shortness of breath) 06/19/2014   Obesity 06/19/2014   Hyperlipidemia 06/19/2014    Past Surgical History:  Procedure Laterality Date   BARIATRIC SURGERY     lap band    CARDIAC CATHETERIZATION N/A 05/08/2015   Procedure: Left Heart Cath and Coronary Angiography;  Surgeon: Corky Crafts, MD;  Location: [facility] INVASIVE CV LAB;  Service: Cardiovascular;  Laterality: N/A;   CARPAL TUNNEL RELEASE Left    CHOLECYSTECTOMY     COLONOSCOPY  06/27/2005   COLONOSCOPY  10/03/2014   ESOPHAGOGASTRODUODENOSCOPY  06/27/2005   ESOPHAGOGASTRODUODENOSCOPY N/A 03/19/2021   Procedure: ESOPHAGOGASTRODUODENOSCOPY (EGD);  Surgeon: Kinsinger, De Blanch, MD;  Location: [facility];  Service: General;  Laterality: N/A;   GASTRIC ROUX-EN-Y N/A 02/18/2021   Procedure: LAPAROSCOPIC ROUX-EN-Y GASTRIC BYPASS WITH UPPER ENDOSCOPY,;  Surgeon: Sheliah Hatch De Blanch, MD;  Location: [facility];  Service: General;  Laterality: N/A;   HEMORRHOID SURGERY  08/25/1997   IR GJ TUBE CHANGE  03/27/2021   LAPAROSCOPIC INSERTION GASTROSTOMY TUBE N/A 03/20/2021   Procedure: LAPAROSCOPIC INSERTION GASTROSTOMY TUBE;  Surgeon: Rodman Pickle, MD;  Location: [facility];  Service: General;  Laterality: N/A;   MOUTH SURGERY     removed area which was benign   TONSILLECTOMY     UPPER GI ENDOSCOPY N/A 03/20/2021   Procedure: UPPER GI ENDOSCOPY;  Surgeon: Sheliah Hatch, De Blanch, MD;  Location: [facility];  Service: General;  Laterality: N/A;       Family History  Problem Relation Age of Onset   Hypertension Mother    Diabetes Mother    Heart attack Father 54   CAD Father    Hypertension Sister    Diabetes Other    Hypertension Other    Heart disease Other     Social History   Tobacco Use   Smoking status: Former    Packs/day: 1.00    Years: 15.00    Pack years: 15.00    Types: Cigarettes    Quit date: 08/05/1989    Years since  quitting: 31.7   Smokeless tobacco: Never  Vaping Use   Vaping Use: Never used  Substance Use Topics   Alcohol use: Never   Drug use: Never    Home Medications Prior to Admission medications   Medication Sig Start Date End Date Taking? Authorizing Provider  acetaminophen (TYLENOL) 160 MG/5ML solution Place 20.3 mLs (650 mg total) into feeding tube every 6 (six) hours as needed for mild pain, headache or fever. 03/29/21   Barnetta Chapel, PA-C  cetirizine HCl (CETIRIZINE HCL CHILDRENS ALRGY) 5 MG/5ML SOLN Take 10 mLs (10 mg total) by mouth once daily 04/11/21     Continuous Blood Gluc Sensor (FREESTYLE LIBRE 2 SENSOR)  MISC Use as directed every 14 days 03/29/21   Barnetta Chapel, PA-C  diltiazem (CARDIZEM) 10 mg/ml SOLN oral suspension Place 6 mLs (60 mg total) into feeding tube every 8 (eight) hours. 03/29/21 04/28/21  Barnetta Chapel, PA-C  docusate (COLACE) 50 MG/5ML liquid Place 10 mLs (100 mg total) into feeding tube 2 (two) times daily. 03/29/21   Barnetta Chapel, PA-C  hydrochlorothiazide 10 mg/mL SUSP Place 0.63 mLs (6.25 mg total) into feeding tube daily. 03/30/21 04/29/21  Barnetta Chapel, PA-C  Hyoscyamine Sulfate SL 0.125 MG SUBL Place 1 tablet (0.125 mg) under the tongue every 6 (six) hours as needed for cramping 03/29/21   Barnetta Chapel, PA-C  insulin lispro (HUMALOG) 100 UNIT/ML KwikPen Inject 0-20 Units into the skin 3 (three) times daily with meals. (according to sliding scale) 03/29/21   Barnetta Chapel, PA-C  Insulin Pen Needle 32G X 4 MM MISC Use as needed as directed 03/29/21   Barnetta Chapel, PA-C  metoprolol tartrate (LOPRESSOR) 25 mg/10 mL SUSP Place 10 mLs (25 mg total) into feeding tube 2 (two) times daily. 03/29/21 04/28/21  Barnetta Chapel, PA-C  Multiple Vitamin (MULTIVITAMIN) LIQD Place 15 mLs into feeding tube daily. 03/30/21 04/29/21  Barnetta Chapel, PA-C  Nutritional Supplements (FEEDING SUPPLEMENT, OSMOLITE 1.2 CAL,) LIQD Place 1,000 mLs into feeding tube continuous. -Begin with 50 ml Osmolite  1.2 via G-tube every 2 hours (8 feedings total while awake) -Free water flush with 30 ml before and after each bolus As tolerated work up to 100 ml Osmolite 1.2 7 times daily (~every 2 hours). -Continue Prosource TF BID. -3 cartons total of Osmolite 1.2 + Prosource TF BID provides 935 kcals (66% of needs), 61g protein (77% of needs), 112g CHO and 1005 ml H2O. -Free water of 240 ml TID via G-tube or PO -Will meet rest of needs with other liquids/diet and protein supplements 03/25/21   Stechschulte, Hyman Hopes, MD  Nutritional Supplements (FEEDING SUPPLEMENT, OSMOLITE 1.2 CAL,) LIQD Place 50 mLs into feeding tube every 2 (two) hours. 03/29/21   Barnetta Chapel, PA-C  Nutritional Supplements (FEEDING SUPPLEMENT, PROSOURCE TF,) liquid Place 45 mLs into feeding tube 2 (two) times daily. -Begin with 50 ml Osmolite 1.2 via G-tube every 2 hours (8 feedings total while awake) -Free water flush with 30 ml before and after each bolus As tolerated work up to 100 ml Osmolite 1.2 7 times daily (~every 2 hours). -Continue Prosource TF BID. -3 cartons total of Osmolite 1.2 + Prosource TF BID provides 935 kcals (66% of needs), 61g protein (77% of needs), 112g CHO and 1005 ml H2O. -Free water of 240 ml TID via G-tube or PO -Will meet rest of needs with other liquids/diet and protein supplements 03/25/21   Stechschulte, Hyman Hopes, MD  Omeprazole Magnesium 10 MG PACK Take 20 mg by mouth daily. 03/29/21   Barnetta Chapel, PA-C  ondansetron (ZOFRAN-ODT) 4 MG disintegrating tablet Dissolve 1 tablet (4 mg total) by mouth every 6 (six) hours as needed for nausea. 03/29/21   Barnetta Chapel, PA-C  potassium chloride 20 MEQ/15ML (10%) SOLN Dilute 15 mls of solution (20 mEq total) as directed and place into feeding tube 2 (two) times daily. 04/03/21   Dunn, Raymon Mutton, PA-C  Propylene Glycol (SYSTANE BALANCE OP) Place 1 drop into both eyes daily as needed (dry eyes).    [provider]  protein supplement (RESOURCE BENEPROTEIN) 6 g POWD  Place 1 Scoop (6 g total) into feeding tube every 3 (three) hours while awake. 03/29/21 04/28/21  Barnetta Chapelsborne, Kelly, PA-C  Water For Irrigation, Sterile (FREE WATER) SOLN Place 60 mLs into feeding tube every 8 (eight) hours. 03/29/21   Barnetta Chapelsborne, Kelly, PA-C  zolpidem (AMBIEN) 5 MG tablet Take 1 tablet (5 mg total) by mouth at bedtime as needed for sleep. 03/29/21   Barnetta Chapelsborne, Kelly, PA-C  insulin aspart (NOVOLOG FLEXPEN) 100 UNIT/ML FlexPen Inject 0-20 Units into the skin 3 (three) times daily with meals (see sliding scale) 03/29/21 03/29/21  Barnetta Chapelsborne, Kelly, PA-C    Allergies    Other  Review of Systems   Review of Systems  Constitutional:  Negative for diaphoresis and fever.  Respiratory:  Positive for chest tightness. Negative for shortness of breath.   Cardiovascular:  Positive for chest pain. Negative for palpitations and leg swelling.  Gastrointestinal:  Negative for abdominal pain, constipation, diarrhea, nausea and vomiting.  Genitourinary:  Negative for difficulty urinating.  Musculoskeletal:  Negative for back pain, neck pain and neck stiffness.  Skin:  Positive for color change.       Paleness of upper extremities compared to baseline  Neurological:  Negative for dizziness, speech difficulty, weakness and light-headedness.  All other systems reviewed and are negative.  Physical Exam Updated Vital Signs Ht 6\' 2"  (1.88 m)   Wt 120.2 kg   BMI 34.02 kg/m   Physical Exam Vitals and nursing note reviewed.  Constitutional:      General: He is not in acute distress.    Appearance: He is well-developed. He is obese. He is not diaphoretic.  HENT:     Head: Normocephalic and atraumatic.  Eyes:     Extraocular Movements: Extraocular movements intact.     Pupils: Pupils are equal, round, and reactive to light.  Cardiovascular:     Rate and Rhythm: Normal rate and regular rhythm.     Heart sounds: Normal heart sounds.  Pulmonary:     Effort: Pulmonary effort is normal.  Chest:     Chest wall:  No crepitus.  Abdominal:     General: Bowel sounds are normal.     Palpations: Abdomen is soft.     Comments: Bruising at several surgical scar sites, feeding tube in upper abdomen without evidence of leak at skin surface  Musculoskeletal:     Cervical back: Normal range of motion and neck supple.     Right lower leg: No edema.     Left lower leg: No edema.  Skin:    General: Skin is warm and dry.     Capillary Refill: Capillary refill takes less than 2 seconds.     Coloration: Skin is pale.  Neurological:     General: No focal deficit present.     Mental Status: He is alert and oriented to person, place, and time.  Psychiatric:        Mood and Affect: Mood normal.        Behavior: Behavior normal.    ED Results / Procedures / Treatments   Labs (all labs ordered are listed, but only abnormal results are displayed) Labs Reviewed  BASIC METABOLIC PANEL  CBC  TROPONIN I (HIGH SENSITIVITY)    EKG EKG Interpretation  Date/Time:  Sunday April 14 2021 12:16:10 EDT Ventricular Rate:  82 PR Interval:  166 QRS Duration: 152 QT Interval:  413 QTC Calculation: 483 R Axis:   96 Text Interpretation: Sinus rhythm RBBB and LPFB ST depression in V2 consistent with previous ecg Confirmed by Kommor, Madison ([id number]) on 04/14/2021 12:21:06 PM  Radiology No results  found.  Procedures Procedures   Medications Ordered in ED Medications - No data to display  ED Course  I have reviewed the triage vital signs and the nursing notes.  Pertinent labs & imaging results that were available during my care of the patient were reviewed by me and considered in my medical decision making (see chart for details).     MDM Rules/Calculators/A&P                         Care for this patient was performed in heavy conjunction with Dr. Posey Rea, and Dr. Rayford Halsted. Dr. Posey Rea spoke with the patient's surgeon who requests CT chest and abdomen with oral and IV contrast to evaluate his gastric bypass  anastomosis.  CT does not show any new abnormality, new signs of obstruction. No evidence of pneumonia on CXR. Serial troponins do reveal elevating troponin. Initial: 7, Second: 192, Third: 3399. Patient without EKG evidence of STEMI on first two EKGs. In setting of ongoing chest pain and elevated troponin, patient was admitted and cardiology consulted for a diagnosis of NSTEMI, and aspirin and heparin were administered.   Final Clinical Impression(s) / ED Diagnoses Final diagnoses:  None    Rx / DC Orders ED Discharge Orders     None        [place] 04/14/21 1726    Glendora Score, MD 04/15/21 [phone]

## 2021-04-14 NOTE — ED Notes (Signed)
[facility] consulted spoke with on call will page for Dr. Posey Rea at this time.

## 2021-04-14 NOTE — H&P (Signed)
Patient Demographics:    Hector Curry, is a 60 y.o. male  MRN: [id number]   DOB - [birth year]  Admit Date - 04/14/2021  Outpatient Primary MD for the patient is Marguarite Arbour, MD   Assessment & Plan:    Principal Problem:   NSTEMI (non-ST elevated myocardial infarction) [facility]) Active Problems:   Family history of premature CAD   Chest pain   Type II diabetes mellitus, uncontrolled (HCC)   History of Roux-en-Y gastric bypass   Obesity   Hyperlipidemia    1)NSTEMI--patient with multiple cardiovascular risk factors including male gender, age, obesity, HLD, DM2 and HTN as well as family history of premature CAD--- who presented with postcoital chest pain -Twelve-lead EKG shows sinus rhythm with RBBB and LPFB -LHC from 05/08/2015 with preserved EF and no obstructive CAD at the time Troponin-- 7>>192>>3,399 EDP Discussed with Dr Bjorn Pippin who request hospitalist service to admit due to comorbid conditions and cardiology will manage NSTEMI with plans for possible LHC -Patient received aspirin, metoprolol and was started on IV heparin and Lipitor -As per general surgeon Dr. Carolynne Edouard patient is several weeks out from abdominal surgery okay to anticoagulate -Will be n.p.o. after midnight for possible LHC  2)Obesity and OSA--CPAP nightly advised -Continues with ongoing weight loss after gastric bypass surgery  3)S/p difficult laparoscopic conversion of laparoscopic adjustable gastric band to gastric bypass on 02/18/2021.---  --He developed a stricture near his GJ anastomosis. This was unable to be traversed with an endoscope, so he underwent laparoscopic remnant gastrostomy tube placement on 03/20/2021-- -- Continue PEG tube feeding -EDP discussed case with on-call general surgeon Dr. Lindley Magnus stated that it was okay  to do IV heparin for ACS  4)DM2-A1c 7.3, reflecting uncontrolled DM with hyperglycemia PTA-- -Hold scheduled insulin for now -Use Novolog/Humalog Sliding scale insulin with Accu-Cheks/Fingersticks as  ordered   5)FEN--tube feeding via PEG tube as ordered, with water flushes, please see #3 above -Tube feeding to be held in a.m. to allow for possible LHC  6)HTN-hold HCTZ due to electrolyte abnormalities,  Hold Cardizem, - continue metoprolol  7)H/o PAFib--currently in sinus rhythm, currently on IV heparin as above #1, hold Cardizem as above #6, continue metoprolol  8)Hyponatremia --sodium is 133.,  Hold HCTZ, monitor closely  9)Hypokalemia--potassium was 3.4 hold HCTZ,  -replace and recheck, check serum mag  Disposition/Need for in-Hospital Stay- patient unable to be discharged at this time due to NSTEMI requiring IV heparin and possibly LHC-  Status is: Inpatient  Remains inpatient appropriate because: Please see disposition above  Dispo: The patient is from: Home              Anticipated d/c is to: Home              Anticipated d/c date is: 2 days              Patient currently is not medically stable to d/c. Barriers: Not Clinically Stable-  With History of - Reviewed by me  Past Medical History:  Diagnosis Date   Allergic rhinitis    Atrial fibrillation (HCC)    Coronary artery disease, non-occlusive    a. 05/08/2015 Cath: no significant CAD, LVEF nl-->Med; b. 07/2017 MV: attenuation artifact, no ischemia, EF 65%-->Low risk.   Diabetes mellitus without complication (HCC)    Diastolic dysfunction    a. 04/2015 Echo: EF 60-65%, mild MR, mildly dil LA, nl RV fxn, nl PASP; b. 05/2016 Echo: EF 60-65%, no rmwa, Gr1 DD, mildly dil Ao root and asc Ao; c. 07/2017 Echo: EF 55-60%, Ao root 42mm. mildly dil LA; d. 03/2019 Echo: EF 55-60%, nl RV fxn. Mild LAE, Ao root/Asc Ao 42mm.   Dilated aortic root (HCC)    a. 07/2017 Echo: 42mm Ao root - mildly dil; b. 03/2019 Echo: Ao root  42mm.   Diverticulosis 10/03/2014   Dysrhythmia    Family history of premature CAD    a. father passed from MI at 849   GERD (gastroesophageal reflux disease)    Hemorrhoids    a. internal hemorrhoids s/p surgery 1999   History of kidney stones    History of tobacco abuse    Hyperlipidemia    Hyperplastic colon polyp 10/03/2014   a. x 2    Hypertension    Inflammatory arthritis    a. CCP antibodies & x-rays negative. Rheumatoid factor 14, felt to be crystaline over RA or psoriatic   Morbid obesity (HCC)    a. s/p LAP-BAND   OSA (obstructive sleep apnea)    a. on CPAP   Osteoarthritis    PSVT (paroxysmal supraventricular tachycardia) (HCC)    a. 48 hr Holter 04/2015: NSR w/ rare PVC, short runs of narrow complex tachycardiac, possible atrial tach, longest run 7 beats, PACs noted (2% of all beats 3600 total) they did not seem to correlate w/ significant arrythmia; b. 08/2017 Event monitor: no significant arrhythmias.      Past Surgical History:  Procedure Laterality Date   BARIATRIC SURGERY     lap band    CARDIAC CATHETERIZATION N/A 05/08/2015   Procedure: Left Heart Cath and Coronary Angiography;  Surgeon: Corky CraftsJayadeep S Varanasi, MD;  [facility];  Service: Cardiovascular;  Laterality: N/A;   CARPAL TUNNEL RELEASE Left    CHOLECYSTECTOMY     COLONOSCOPY  06/27/2005   COLONOSCOPY  10/03/2014   ESOPHAGOGASTRODUODENOSCOPY  06/27/2005   ESOPHAGOGASTRODUODENOSCOPY N/A 03/19/2021   Procedure: ESOPHAGOGASTRODUODENOSCOPY (EGD);  Surgeon: Sheliah HatchKinsinger, De BlanchLuke Aaron, MD;  [facility];  Service: General;  Laterality: N/A;   GASTRIC ROUX-EN-Y N/A 02/18/2021   Procedure: LAPAROSCOPIC ROUX-EN-Y GASTRIC BYPASS WITH UPPER ENDOSCOPY,;  Surgeon: Sheliah HatchKinsinger, De BlanchLuke Aaron, MD;  Location: [facility];  Service: General;  Laterality: N/A;   HEMORRHOID SURGERY  08/25/1997   IR GJ TUBE CHANGE  03/27/2021   LAPAROSCOPIC INSERTION GASTROSTOMY TUBE N/A 03/20/2021   Procedure: LAPAROSCOPIC  INSERTION GASTROSTOMY TUBE;  Surgeon: Rodman PickleKinsinger, Luke Aaron, MD;  Location: [facility];  Service: General;  Laterality: N/A;   MOUTH SURGERY     removed area which was benign   TONSILLECTOMY     UPPER GI ENDOSCOPY N/A 03/20/2021   Procedure: UPPER GI ENDOSCOPY;  Surgeon: Sheliah HatchKinsinger, De BlanchLuke Aaron, MD;  Location: [facility];  Service: General;  Laterality: N/A;      Chief Complaint  Patient presents with   Chest Pain      HPI:    Hector MustacheGary Curry  is a 60 y.o. male who  is a reformed smoker with past medical history relevant for obesity, OSA, paroxysmal atrial fibrillation, HTN, who underwent difficult laparoscopic conversion of laparoscopic adjustable gastric band to gastric bypass on 02/18/2021.  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 This was unable to be traversed with an endoscope, so he underwent laparoscopic remnant gastrostomy tube placement on 03/20/2021.   --Who now presents to the ED with postcoital chest pain -No pleuritic symptoms no leg pains No fever  Or chills , No Nausea, Vomiting or Diarrhea -Twelve-lead EKG shows sinus rhythm with RBBB and LPFB -LHC from 05/08/2015 with preserved EF and no obstructive CAD at the time Troponin-- 7>>192>>3,399 EDP Discussed with Dr Bjorn Pippin who request hospitalist service to admit due to comorbid conditions and cardiology will manage NSTEMI with plans for possible LHC -Patient received aspirin, metoprolol and was started on IV heparin and Lipitor -As per general surgeon Dr. Carolynne Edouard patient is several weeks out from abdominal surgery okay to anticoagulate -Will be n.p.o. after midnight for possible LHC -CT abdomen and pelvis with contrast is without acute findings, there is suggestion of possible liver cirrhosis -CT chest with contrast without acute findings, suggest respiratory bronchiolitis in a reformed smoker -Chest x-ray without acute findings -Additional history obtained from patient's wife at bedside -Potassium is 3.4 sodium is 133  creatinine 0.68 -CBC WNL      Review of systems:    In addition to the HPI above,   A full Review of  Systems was done, all other systems reviewed are negative except as noted above in HPI , .    Social History:  Reviewed by me    Social History   Tobacco Use   Smoking status: Former    Packs/day: 1.00    Years: 15.00    Pack years: 15.00    Types: Cigarettes    Quit date: 08/05/1989    Years since quitting: 31.7   Smokeless tobacco: Never  Substance Use Topics   Alcohol use: Never       Family History :  Reviewed by me    Family History  Problem Relation Age of Onset   Hypertension Mother    Diabetes Mother    Heart attack Father 66   CAD Father    Hypertension Sister    Diabetes Other    Hypertension Other    Heart disease Other      Home Medications:   Prior to Admission medications   Medication Sig Start Date End Date Taking? Authorizing Provider  diltiazem (CARDIZEM) 10 mg/ml SOLN oral suspension Place 6 mLs (60 mg total) into feeding tube every 8 (eight) hours. 03/29/21 04/28/21 Yes Barnetta Chapel, PA-C  hydrochlorothiazide 10 mg/mL SUSP Place 0.63 mLs (6.25 mg total) into feeding tube daily. 03/30/21 04/29/21 Yes Barnetta Chapel, PA-C  Hyoscyamine Sulfate SL 0.125 MG SUBL Place 1 tablet (0.125 mg) under the tongue every 6 (six) hours as needed for cramping 03/29/21  Yes Barnetta Chapel, PA-C  insulin lispro (HUMALOG) 100 UNIT/ML KwikPen Inject 0-20 Units into the skin 3 (three) times daily with meals. (according to sliding scale) 03/29/21  Yes Barnetta Chapel, PA-C  Melatonin 5 MG SUBL Place 5 mg under the tongue at bedtime.   Yes [provider]  metoprolol tartrate (LOPRESSOR) 25 mg/10 mL SUSP Place 10 mLs (25 mg total) into feeding tube 2 (two) times daily. 03/29/21 04/28/21 Yes Barnetta Chapel, PA-C  Multiple Vitamin (MULTIVITAMIN) LIQD Place 15 mLs into feeding tube daily. 03/30/21 04/29/21 Yes Barnetta Chapel, PA-C  Nutritional Supplements (FEEDING  SUPPLEMENT, OSMOLITE 1.2 CAL,) LIQD Place 1,000 mLs into feeding tube continuous. -Begin with 50 ml Osmolite 1.2 via G-tube every 2 hours (8 feedings total while awake) -Free water flush with 30 ml before and after each bolus As tolerated work up to 100 ml Osmolite 1.2 7 times daily (~every 2 hours). -Continue Prosource TF BID. -3 cartons total of Osmolite 1.2 + Prosource TF BID provides 935 kcals (66% of needs), 61g protein (77% of needs), 112g CHO and 1005 ml H2O. -Free water of 240 ml TID via G-tube or PO -Will meet rest of needs with other liquids/diet and protein supplements 03/25/21  Yes Stechschulte, Hyman Hopes, MD  Omeprazole Magnesium 10 MG PACK Take 20 mg by mouth daily. Patient taking differently: Take 20 mg by mouth daily. Dissolve in water 03/29/21  Yes Barnetta Chapel, PA-C  ondansetron (ZOFRAN-ODT) 4 MG disintegrating tablet Dissolve 1 tablet (4 mg total) by mouth every 6 (six) hours as needed for nausea. 03/29/21  Yes Barnetta Chapel, PA-C  potassium chloride 20 MEQ/15ML (10%) SOLN Dilute 15 mls of solution (20 mEq total) as directed and place into feeding tube 2 (two) times daily. 04/03/21  Yes Dunn, Raymon Mutton, PA-C  Propylene Glycol (SYSTANE BALANCE OP) Place 1 drop into both eyes daily as needed (dry eyes).   Yes [provider]  protein supplement (RESOURCE BENEPROTEIN) 6 g POWD Place 1 Scoop (6 g total) into feeding tube every 3 (three) hours while awake. 03/29/21 04/28/21 Yes Barnetta Chapel, PA-C  Water For Irrigation, Sterile (FREE WATER) SOLN Place 60 mLs into feeding tube every 8 (eight) hours. 03/29/21  Yes Barnetta Chapel, PA-C  acetaminophen (TYLENOL) 160 MG/5ML solution Place 20.3 mLs (650 mg total) into feeding tube every 6 (six) hours as needed for mild pain, headache or fever. Patient not taking: No sig reported 03/29/21   Barnetta Chapel, PA-C  cetirizine HCl (CETIRIZINE HCL CHILDRENS ALRGY) 5 MG/5ML SOLN Take 10 mLs (10 mg total) by mouth once daily Patient not taking: No sig  reported 04/11/21     cetirizine HCl (ZYRTEC) 5 MG/5ML SOLN Take by mouth. Patient not taking: No sig reported 04/11/21 04/11/22  [provider]  Continuous Blood Gluc Sensor (FREESTYLE LIBRE 2 SENSOR) MISC Use as directed every 14 days 03/29/21   Barnetta Chapel, PA-C  docusate (COLACE) 50 MG/5ML liquid Place 10 mLs (100 mg total) into feeding tube 2 (two) times daily. Patient not taking: No sig reported 03/29/21   Barnetta Chapel, PA-C  Insulin Pen Needle 32G X 4 MM MISC Use as needed as directed 03/29/21   Barnetta Chapel, PA-C  metoprolol tartrate (LOPRESSOR) 25 mg/10 mL SUSP Take 2.5 mLs (25 mg total) by G tube 2 (two) times daily **Refrigerate** Patient not taking: No sig reported 04/04/21 04/04/22  [provider]  Nutritional Supplements (FEEDING SUPPLEMENT, OSMOLITE 1.2 CAL,) LIQD Place 50 mLs into feeding tube every 2 (two) hours. 03/29/21   Barnetta Chapel, PA-C  Nutritional Supplements (FEEDING SUPPLEMENT, PROSOURCE TF,) liquid Place 45 mLs into feeding tube 2 (two) times daily. -Begin with 50 ml Osmolite 1.2 via G-tube every 2 hours (8 feedings total while awake) -Free water flush with 30 ml before and after each bolus As tolerated work up to 100 ml Osmolite 1.2 7 times daily (~every 2 hours). -Continue Prosource TF BID. -3 cartons total of Osmolite 1.2 + Prosource TF BID provides 935 kcals (66% of needs), 61g protein (77% of needs), 112g CHO and 1005 ml H2O. -  Free water of 240 ml TID via G-tube or PO -Will meet rest of needs with other liquids/diet and protein supplements Patient not taking: Reported on 04/14/2021 03/25/21   Stechschulte, Hyman Hopes, MD  zolpidem (AMBIEN) 5 MG tablet Take 1 tablet (5 mg total) by mouth at bedtime as needed for sleep. Patient not taking: No sig reported 03/29/21   Barnetta Chapel, PA-C  insulin aspart (NOVOLOG FLEXPEN) 100 UNIT/ML FlexPen Inject 0-20 Units into the skin 3 (three) times daily with meals (see sliding scale) 03/29/21 03/29/21  Barnetta Chapel, PA-C      Allergies:     Allergies  Allergen Reactions   Other Other (See Comments)    Cats     Physical Exam:   Vitals  Blood pressure 132/84, pulse 84, temperature 98 F (36.7 C), resp. rate 16, height  (1.88 m), weight 120.2 kg, SpO2 97 %.  Physical Examination: General appearance - alert, obese and in no distress Mental status - alert, oriented to person, place, and time,  Eyes - sclera anicteric Neck - supple, no JVD elevation , Chest - clear  to auscultation bilaterally, symmetrical air movement,  Heart - S1 and S2 normal, regular  Abdomen - soft, nontender, nondistended, PEG tube in situ Neurological - screening mental status exam normal, neck supple without rigidity, cranial nerves II through XII intact, DTR's normal and symmetric Extremities - no pedal edema noted, intact peripheral pulses  Skin - warm, dry   Data Review:    CBC Recent Labs  Lab 04/14/21 1222  WBC 8.6  HGB 15.4  HCT 47.3  PLT 231  MCV 89.2  MCH 29.1  MCHC 32.6  RDW 14.6   ------------------------------------------------------------------------------------------------------------------  Chemistries  Recent Labs  Lab 04/14/21 1222  NA 133*  K 3.4*  CL 101  CO2 25  GLUCOSE 170*  BUN 9  CREATININE 0.68  CALCIUM 8.9   ------------------------------------------------------------------------------------------------------------------ estimated creatinine clearance is 138.7 mL/min (by C-G formula based on SCr of 0.68 mg/dL). ------------------------------------------------------------------------------------------------------------------ No results for input(s): TSH, T4TOTAL, T3FREE, THYROIDAB in the last 72 hours.  Invalid input(s): FREET3   Coagulation profile No results for input(s): INR, PROTIME in the last 168 hours. ------------------------------------------------------------------------------------------------------------------- No results for input(s): DDIMER in the last  72 hours. -------------------------------------------------------------------------------------------------------------------  Cardiac Enzymes No results for input(s): CKMB, TROPONINI, MYOGLOBIN in the last 168 hours.  Invalid input(s): CK ------------------------------------------------------------------------------------------------------------------ No results found for: BNP   ---------------------------------------------------------------------------------------------------------------  Urinalysis    Component Value Date/Time   COLORURINE AMBER (A) 03/06/2021 0905   APPEARANCEUR HAZY (A) 03/06/2021 0905   LABSPEC 1.023 03/06/2021 0905   PHURINE 6.0 03/06/2021 0905   GLUCOSEU NEGATIVE 03/06/2021 0905   HGBUR NEGATIVE 03/06/2021 0905   BILIRUBINUR NEGATIVE 03/06/2021 0905   KETONESUR 80 (A) 03/06/2021 0905   PROTEINUR 100 (A) 03/06/2021 0905   UROBILINOGEN 0.2 11/24/2009 1658   NITRITE NEGATIVE 03/06/2021 0905   LEUKOCYTESUR NEGATIVE 03/06/2021 0905    ----------------------------------------------------------------------------------------------------------------   Imaging Results:    DG Chest 2 View  Result Date: 04/14/2021 CLINICAL DATA:  Right-sided chest pain. EXAM: CHEST - 2 VIEW COMPARISON:  11/08/2019 FINDINGS: The heart size and mediastinal contours are within normal limits. Both lungs are clear. The visualized skeletal structures are unremarkable. IMPRESSION: No active cardiopulmonary disease. Electronically Signed   By: Danae Orleans M.D.   On: 04/14/2021 13:06   CT Chest W Contrast  Result Date: 04/14/2021 CLINICAL DATA:  Status post Roux-en-Y gastric bypass NG tube placement, chest pain, vomiting, concern for surgical  complication EXAM: CT CHEST AND ABDOMEN WITH CONTRAST TECHNIQUE: Multidetector CT imaging of the chest and abdomen was performed following the standard protocol during bolus administration of intravenous contrast. CONTRAST:  26mL OMNIPAQUE IOHEXOL  350 MG/ML SOLN, additional oral enteric contrast COMPARISON:  None. FINDINGS: CT CHEST FINDINGS Cardiovascular: Scattered aortic atherosclerosis. Normal heart size. Three-vessel coronary artery calcifications. No pericardial effusion. Mediastinum/Nodes: No enlarged mediastinal, hilar, or axillary lymph nodes. Thyroid gland, trachea, and esophagus demonstrate no significant findings. Lungs/Pleura: Mild, diffuse bilateral bronchial wall thickening. Background of very fine centrilobular nodules throughout the lungs, most concentrated in the apices. No pleural effusion or pneumothorax. Musculoskeletal: No chest wall mass or suspicious bone lesions identified. CT ABDOMEN FINDINGS Hepatobiliary: No focal liver abnormality is seen. Somewhat coarse, nodular contour of the liver. Status post cholecystectomy. No biliary ductal dilatation. Pancreas: Unremarkable. No pancreatic ductal dilatation or surrounding inflammatory changes. Spleen: Normal in size without focal abnormality. Adrenals/Urinary Tract: Adrenal glands are unremarkable. Kidneys are normal, without renal calculi, focal lesion, or hydronephrosis. Bladder is unremarkable. Stomach/Bowel: Postoperative findings of Roux-en-Y type gastric bypass with additional percutaneous gastrostomy of the gastric remnant. The gastric remnant is fluid-filled although not distended. Appendix appears normal. No evidence of bowel wall thickening, distention, or inflammatory changes. Vascular/Lymphatic: Aortic atherosclerosis. No enlarged abdominal or pelvic lymph nodes. Other: No abdominal wall hernia or abnormality. Minimal residua of a prior right rectus sheath hematoma (series 2, image 80) no abdominopelvic ascites. Musculoskeletal: No acute or significant osseous findings. IMPRESSION: 1. Postoperative findings of Roux-en-Y type gastric bypass with additional percutaneous gastrostomy of the gastric remnant. The gastric remnant is fluid-filled although not distended. 2. No evidence  of bowel obstruction or other surgical complication. 3. Somewhat coarse, nodular contour of the liver, suggestive of cirrhosis. 4. Background of very fine centrilobular nodules throughout the lungs, most concentrated in the apices, most consistent with smoking-related respiratory bronchiolitis. 5. Minimal residua of a prior right rectus sheath hematoma. 6. Coronary artery disease. Aortic Atherosclerosis (ICD10-I70.0). Electronically Signed   By: Lauralyn Primes M.D.   On: 04/14/2021 15:22   CT ABDOMEN W CONTRAST  Result Date: 04/14/2021 CLINICAL DATA:  Status post Roux-en-Y gastric bypass NG tube placement, chest pain, vomiting, concern for surgical complication EXAM: CT CHEST AND ABDOMEN WITH CONTRAST TECHNIQUE: Multidetector CT imaging of the chest and abdomen was performed following the standard protocol during bolus administration of intravenous contrast. CONTRAST:  26mL OMNIPAQUE IOHEXOL 350 MG/ML SOLN, additional oral enteric contrast COMPARISON:  None. FINDINGS: CT CHEST FINDINGS Cardiovascular: Scattered aortic atherosclerosis. Normal heart size. Three-vessel coronary artery calcifications. No pericardial effusion. Mediastinum/Nodes: No enlarged mediastinal, hilar, or axillary lymph nodes. Thyroid gland, trachea, and esophagus demonstrate no significant findings. Lungs/Pleura: Mild, diffuse bilateral bronchial wall thickening. Background of very fine centrilobular nodules throughout the lungs, most concentrated in the apices. No pleural effusion or pneumothorax. Musculoskeletal: No chest wall mass or suspicious bone lesions identified. CT ABDOMEN FINDINGS Hepatobiliary: No focal liver abnormality is seen. Somewhat coarse, nodular contour of the liver. Status post cholecystectomy. No biliary ductal dilatation. Pancreas: Unremarkable. No pancreatic ductal dilatation or surrounding inflammatory changes. Spleen: Normal in size without focal abnormality. Adrenals/Urinary Tract: Adrenal glands are unremarkable.  Kidneys are normal, without renal calculi, focal lesion, or hydronephrosis. Bladder is unremarkable. Stomach/Bowel: Postoperative findings of Roux-en-Y type gastric bypass with additional percutaneous gastrostomy of the gastric remnant. The gastric remnant is fluid-filled although not distended. Appendix appears normal. No evidence of bowel wall thickening, distention, or inflammatory changes. Vascular/Lymphatic: Aortic atherosclerosis. No enlarged abdominal  or pelvic lymph nodes. Other: No abdominal wall hernia or abnormality. Minimal residua of a prior right rectus sheath hematoma (series 2, image 80) no abdominopelvic ascites. Musculoskeletal: No acute or significant osseous findings. IMPRESSION: 1. Postoperative findings of Roux-en-Y type gastric bypass with additional percutaneous gastrostomy of the gastric remnant. The gastric remnant is fluid-filled although not distended. 2. No evidence of bowel obstruction or other surgical complication. 3. Somewhat coarse, nodular contour of the liver, suggestive of cirrhosis. 4. Background of very fine centrilobular nodules throughout the lungs, most concentrated in the apices, most consistent with smoking-related respiratory bronchiolitis. 5. Minimal residua of a prior right rectus sheath hematoma. 6. Coronary artery disease. Aortic Atherosclerosis (ICD10-I70.0). Electronically Signed   By: Lauralyn Primes M.D.   On: 04/14/2021 15:22    Radiological Exams on Admission: DG Chest 2 View  Result Date: 04/14/2021 CLINICAL DATA:  Right-sided chest pain. EXAM: CHEST - 2 VIEW COMPARISON:  11/08/2019 FINDINGS: The heart size and mediastinal contours are within normal limits. Both lungs are clear. The visualized skeletal structures are unremarkable. IMPRESSION: No active cardiopulmonary disease. Electronically Signed   By: Danae Orleans M.D.   On: 04/14/2021 13:06   CT Chest W Contrast  Result Date: 04/14/2021 CLINICAL DATA:  Status post Roux-en-Y gastric bypass NG tube  placement, chest pain, vomiting, concern for surgical complication EXAM: CT CHEST AND ABDOMEN WITH CONTRAST TECHNIQUE: Multidetector CT imaging of the chest and abdomen was performed following the standard protocol during bolus administration of intravenous contrast. CONTRAST:  80mL OMNIPAQUE IOHEXOL 350 MG/ML SOLN, additional oral enteric contrast COMPARISON:  None. FINDINGS: CT CHEST FINDINGS Cardiovascular: Scattered aortic atherosclerosis. Normal heart size. Three-vessel coronary artery calcifications. No pericardial effusion. Mediastinum/Nodes: No enlarged mediastinal, hilar, or axillary lymph nodes. Thyroid gland, trachea, and esophagus demonstrate no significant findings. Lungs/Pleura: Mild, diffuse bilateral bronchial wall thickening. Background of very fine centrilobular nodules throughout the lungs, most concentrated in the apices. No pleural effusion or pneumothorax. Musculoskeletal: No chest wall mass or suspicious bone lesions identified. CT ABDOMEN FINDINGS Hepatobiliary: No focal liver abnormality is seen. Somewhat coarse, nodular contour of the liver. Status post cholecystectomy. No biliary ductal dilatation. Pancreas: Unremarkable. No pancreatic ductal dilatation or surrounding inflammatory changes. Spleen: Normal in size without focal abnormality. Adrenals/Urinary Tract: Adrenal glands are unremarkable. Kidneys are normal, without renal calculi, focal lesion, or hydronephrosis. Bladder is unremarkable. Stomach/Bowel: Postoperative findings of Roux-en-Y type gastric bypass with additional percutaneous gastrostomy of the gastric remnant. The gastric remnant is fluid-filled although not distended. Appendix appears normal. No evidence of bowel wall thickening, distention, or inflammatory changes. Vascular/Lymphatic: Aortic atherosclerosis. No enlarged abdominal or pelvic lymph nodes. Other: No abdominal wall hernia or abnormality. Minimal residua of a prior right rectus sheath hematoma (series 2, image  80) no abdominopelvic ascites. Musculoskeletal: No acute or significant osseous findings. IMPRESSION: 1. Postoperative findings of Roux-en-Y type gastric bypass with additional percutaneous gastrostomy of the gastric remnant. The gastric remnant is fluid-filled although not distended. 2. No evidence of bowel obstruction or other surgical complication. 3. Somewhat coarse, nodular contour of the liver, suggestive of cirrhosis. 4. Background of very fine centrilobular nodules throughout the lungs, most concentrated in the apices, most consistent with smoking-related respiratory bronchiolitis. 5. Minimal residua of a prior right rectus sheath hematoma. 6. Coronary artery disease. Aortic Atherosclerosis (ICD10-I70.0). Electronically Signed   By: Lauralyn Primes M.D.   On: 04/14/2021 15:22   CT ABDOMEN W CONTRAST  Result Date: 04/14/2021 CLINICAL DATA:  Status post Roux-en-Y gastric bypass NG  tube placement, chest pain, vomiting, concern for surgical complication EXAM: CT CHEST AND ABDOMEN WITH CONTRAST TECHNIQUE: Multidetector CT imaging of the chest and abdomen was performed following the standard protocol during bolus administration of intravenous contrast. CONTRAST:  72mL OMNIPAQUE IOHEXOL 350 MG/ML SOLN, additional oral enteric contrast COMPARISON:  None. FINDINGS: CT CHEST FINDINGS Cardiovascular: Scattered aortic atherosclerosis. Normal heart size. Three-vessel coronary artery calcifications. No pericardial effusion. Mediastinum/Nodes: No enlarged mediastinal, hilar, or axillary lymph nodes. Thyroid gland, trachea, and esophagus demonstrate no significant findings. Lungs/Pleura: Mild, diffuse bilateral bronchial wall thickening. Background of very fine centrilobular nodules throughout the lungs, most concentrated in the apices. No pleural effusion or pneumothorax. Musculoskeletal: No chest wall mass or suspicious bone lesions identified. CT ABDOMEN FINDINGS Hepatobiliary: No focal liver abnormality is seen.  Somewhat coarse, nodular contour of the liver. Status post cholecystectomy. No biliary ductal dilatation. Pancreas: Unremarkable. No pancreatic ductal dilatation or surrounding inflammatory changes. Spleen: Normal in size without focal abnormality. Adrenals/Urinary Tract: Adrenal glands are unremarkable. Kidneys are normal, without renal calculi, focal lesion, or hydronephrosis. Bladder is unremarkable. Stomach/Bowel: Postoperative findings of Roux-en-Y type gastric bypass with additional percutaneous gastrostomy of the gastric remnant. The gastric remnant is fluid-filled although not distended. Appendix appears normal. No evidence of bowel wall thickening, distention, or inflammatory changes. Vascular/Lymphatic: Aortic atherosclerosis. No enlarged abdominal or pelvic lymph nodes. Other: No abdominal wall hernia or abnormality. Minimal residua of a prior right rectus sheath hematoma (series 2, image 80) no abdominopelvic ascites. Musculoskeletal: No acute or significant osseous findings. IMPRESSION: 1. Postoperative findings of Roux-en-Y type gastric bypass with additional percutaneous gastrostomy of the gastric remnant. The gastric remnant is fluid-filled although not distended. 2. No evidence of bowel obstruction or other surgical complication. 3. Somewhat coarse, nodular contour of the liver, suggestive of cirrhosis. 4. Background of very fine centrilobular nodules throughout the lungs, most concentrated in the apices, most consistent with smoking-related respiratory bronchiolitis. 5. Minimal residua of a prior right rectus sheath hematoma. 6. Coronary artery disease. Aortic Atherosclerosis (ICD10-I70.0). Electronically Signed   By: Lauralyn Primes M.D.   On: 04/14/2021 15:22    DVT Prophylaxis -SCD/iv Heparin AM Labs Ordered, also please review Full Orders  Family Communication: Admission, patients condition and plan of care including tests being ordered have been discussed with the patient and iv Heparin who  indicate understanding and agree with the plan   Code Status - Full Code  Likely DC to home  Condition   -Stable  Shon Hale M.D on 04/14/2021 at 6:44 PM Go to [email or web address] -  for contact info  [facility] - Office  [phone]

## 2021-04-14 NOTE — ED Notes (Signed)
Pt returned to room, reports pain 3/10

## 2021-04-15 ENCOUNTER — Inpatient Hospital Stay (HOSPITAL_COMMUNITY): Payer: No Typology Code available for payment source

## 2021-04-15 ENCOUNTER — Encounter (HOSPITAL_COMMUNITY)
Admission: EM | Disposition: A | Discharge status: Home Health | Payer: Self-pay | Source: Home / Self Care | Attending: Internal Medicine

## 2021-04-15 DIAGNOSIS — I10 Essential (primary) hypertension: Secondary | ICD-10-CM

## 2021-04-15 DIAGNOSIS — I214 Non-ST elevation (NSTEMI) myocardial infarction: Principal | ICD-10-CM

## 2021-04-15 DIAGNOSIS — I2510 Atherosclerotic heart disease of native coronary artery without angina pectoris: Secondary | ICD-10-CM

## 2021-04-15 DIAGNOSIS — I472 Ventricular tachycardia: Secondary | ICD-10-CM

## 2021-04-15 HISTORY — PX: CORONARY STENT INTERVENTION: CATH118234

## 2021-04-15 HISTORY — PX: LEFT HEART CATH AND CORONARY ANGIOGRAPHY: CATH118249

## 2021-04-15 LAB — LIPID PANEL
Cholesterol: 141 mg/dL (ref 0–200)
HDL: 26 mg/dL — ABNORMAL LOW (ref >40)
LDL Cholesterol: 93 mg/dL (ref 0–99)
Total CHOL/HDL Ratio: 5.4 RATIO
Triglycerides: 109 mg/dL (ref <150)
VLDL: 22 mg/dL (ref 0–40)

## 2021-04-15 LAB — COMPREHENSIVE METABOLIC PANEL
ALT: 36 U/L (ref 0–44)
AST: 61 U/L — ABNORMAL HIGH (ref 15–41)
Albumin: 3.1 g/dL — ABNORMAL LOW (ref 3.5–5.0)
Alkaline Phosphatase: 60 U/L (ref 38–126)
Anion gap: 8 (ref 5–15)
BUN: 6 mg/dL (ref 6–20)
CO2: 28 mmol/L (ref 22–32)
Calcium: 9 mg/dL (ref 8.9–10.3)
Chloride: 99 mmol/L (ref 98–111)
Creatinine, Ser: 0.61 mg/dL (ref 0.61–1.24)
GFR, Estimated: >60 mL/min (ref >60)
Glucose, Bld: 150 mg/dL — ABNORMAL HIGH (ref 70–99)
Potassium: 3.9 mmol/L (ref 3.5–5.1)
Sodium: 135 mmol/L (ref 135–145)
Total Bilirubin: 0.6 mg/dL (ref 0.3–1.2)
Total Protein: 6.7 g/dL (ref 6.5–8.1)

## 2021-04-15 LAB — CBC
HCT: 42.1 % (ref 39.0–52.0)
HCT: 44.1 % (ref 39.0–52.0)
Hemoglobin: 13.7 g/dL (ref 13.0–17.0)
Hemoglobin: 14.5 g/dL (ref 13.0–17.0)
MCH: 28.6 pg (ref 26.0–34.0)
MCH: 28.7 pg (ref 26.0–34.0)
MCHC: 32.5 g/dL (ref 30.0–36.0)
MCHC: 32.9 g/dL (ref 30.0–36.0)
MCV: 87.9 fL (ref 80.0–100.0)
MCV: 87.3 fL (ref 80.0–100.0)
Platelets: 152 10*3/uL (ref 150–400)
Platelets: 188 K/uL (ref 150–400)
RBC: 4.79 MIL/uL (ref 4.22–5.81)
RBC: 5.05 MIL/uL (ref 4.22–5.81)
RDW: 14.6 % (ref 11.5–15.5)
RDW: 14.6 % (ref 11.5–15.5)
WBC: 5.5 10*3/uL (ref 4.0–10.5)
WBC: 6.7 K/uL (ref 4.0–10.5)
nRBC: 0 % (ref 0.0–0.2)
nRBC: 0 % (ref 0.0–0.2)

## 2021-04-15 LAB — BASIC METABOLIC PANEL
Anion gap: 11 (ref 5–15)
BUN: 5 mg/dL — ABNORMAL LOW (ref 6–20)
CO2: 23 mmol/L (ref 22–32)
Calcium: 9.3 mg/dL (ref 8.9–10.3)
Chloride: 102 mmol/L (ref 98–111)
Creatinine, Ser: 0.61 mg/dL (ref 0.61–1.24)
GFR, Estimated: >60 mL/min (ref >60)
Glucose, Bld: 152 mg/dL — ABNORMAL HIGH (ref 70–99)
Potassium: 4.2 mmol/L (ref 3.5–5.1)
Sodium: 136 mmol/L (ref 135–145)

## 2021-04-15 LAB — ECHOCARDIOGRAM COMPLETE
Area-P 1/2: 3.72 cm2
Calc EF: 59.6 %
Height: 74 in
S' Lateral: 3.5 cm
Single Plane A2C EF: 62.4 %
Single Plane A4C EF: 56.2 %
Weight: 4240 oz

## 2021-04-15 LAB — GLUCOSE, CAPILLARY
Glucose-Capillary: 154 mg/dL — ABNORMAL HIGH (ref 70–99)
Glucose-Capillary: 170 mg/dL — ABNORMAL HIGH (ref 70–99)
Glucose-Capillary: 137 mg/dL — ABNORMAL HIGH (ref 70–99)
Glucose-Capillary: 118 mg/dL — ABNORMAL HIGH (ref 70–99)

## 2021-04-15 LAB — PROTIME-INR
INR: 1.2 (ref 0.8–1.2)
Prothrombin Time: 14.8 seconds (ref 11.4–15.2)

## 2021-04-15 LAB — HEPARIN LEVEL (UNFRACTIONATED): Heparin Unfractionated: 0.14 [IU]/mL — ABNORMAL LOW (ref 0.30–0.70)

## 2021-04-15 LAB — TROPONIN I (HIGH SENSITIVITY): Troponin I (High Sensitivity): 6719 ng/L

## 2021-04-15 LAB — POCT ACTIVATED CLOTTING TIME: Activated Clotting Time: 277 seconds

## 2021-04-15 SURGERY — LEFT HEART CATH AND CORONARY ANGIOGRAPHY
Anesthesia: LOCAL

## 2021-04-15 MED ORDER — MIDAZOLAM HCL 2 MG/2ML IJ SOLN
INTRAMUSCULAR | Status: AC
  Filled 2021-04-15: qty 2

## 2021-04-15 MED ORDER — ASPIRIN 81 MG PO CHEW
81.0000 mg | CHEWABLE_TABLET | Freq: Every day | ORAL | Status: DC
  Administered 2021-04-16: 81 mg via ORAL
  Filled 2021-04-15: qty 1

## 2021-04-15 MED ORDER — TICAGRELOR 90 MG PO TABS
ORAL_TABLET | ORAL | Status: DC | PRN
  Administered 2021-04-15: 180 mg via ORAL

## 2021-04-15 MED ORDER — TICAGRELOR 90 MG PO TABS
90.0000 mg | ORAL_TABLET | Freq: Two times a day (BID) | ORAL | Status: DC
  Administered 2021-04-16 (×2): 90 mg via ORAL
  Filled 2021-04-15 (×2): qty 1

## 2021-04-15 MED ORDER — OSMOLITE 1.2 CAL PO LIQD: 237.0000 mL | Freq: Four times a day (QID) | ORAL | Status: DC

## 2021-04-15 MED ORDER — LIDOCAINE HCL (PF) 1 % IJ SOLN
INTRAMUSCULAR | Status: DC | PRN
  Administered 2021-04-15: 2 mL

## 2021-04-15 MED ORDER — MIDAZOLAM HCL 2 MG/2ML IJ SOLN
INTRAMUSCULAR | Status: DC | PRN
  Administered 2021-04-15: 1 mg via INTRAVENOUS

## 2021-04-15 MED ORDER — ACETAMINOPHEN 325 MG PO TABS: 650.0000 mg | ORAL_TABLET | ORAL | Status: DC | PRN

## 2021-04-15 MED ORDER — ACETAMINOPHEN 325 MG PO TABS: 650.0000 mg | ORAL_TABLET | Freq: Four times a day (QID) | ORAL | Status: DC | PRN

## 2021-04-15 MED ORDER — ONDANSETRON HCL 4 MG/2ML IJ SOLN: 4.0000 mg | Freq: Four times a day (QID) | INTRAMUSCULAR | Status: DC | PRN

## 2021-04-15 MED ORDER — CANGRELOR TETRASODIUM 50 MG IV SOLR
INTRAVENOUS | Status: AC
  Filled 2021-04-15: qty 50

## 2021-04-15 MED ORDER — FENTANYL CITRATE (PF) 100 MCG/2ML IJ SOLN
INTRAMUSCULAR | Status: DC | PRN
  Administered 2021-04-15: 25 ug via INTRAVENOUS

## 2021-04-15 MED ORDER — HEPARIN SODIUM (PORCINE) 1000 UNIT/ML IJ SOLN
INTRAMUSCULAR | Status: AC
  Filled 2021-04-15: qty 1

## 2021-04-15 MED ORDER — SODIUM CHLORIDE 0.9% FLUSH
3.0000 mL | Freq: Two times a day (BID) | INTRAVENOUS | Status: DC
  Administered 2021-04-16 (×2): 3 mL via INTRAVENOUS

## 2021-04-15 MED ORDER — HEPARIN (PORCINE) IN NACL 1000-0.9 UT/500ML-% IV SOLN
INTRAVENOUS | Status: DC | PRN
  Administered 2021-04-15 (×2): 500 mL

## 2021-04-15 MED ORDER — SODIUM CHLORIDE 0.9 % IV SOLN: 250.0000 mL | INTRAVENOUS | Status: DC | PRN

## 2021-04-15 MED ORDER — NITROGLYCERIN 1 MG/10 ML FOR IR/CATH LAB
INTRA_ARTERIAL | Status: AC
  Filled 2021-04-15: qty 10

## 2021-04-15 MED ORDER — ASPIRIN 81 MG PO CHEW
81.0000 mg | CHEWABLE_TABLET | ORAL | Status: AC
  Administered 2021-04-15: 81 mg via ORAL

## 2021-04-15 MED ORDER — ADULT MULTIVITAMIN LIQUID CH
15.0000 mL | Freq: Every day | ORAL | Status: DC
  Administered 2021-04-15 – 2021-04-16 (×2): 15 mL
  Filled 2021-04-15 (×2): qty 15

## 2021-04-15 MED ORDER — PERFLUTREN LIPID MICROSPHERE
1.0000 mL | INTRAVENOUS | Status: AC | PRN
  Administered 2021-04-15: 2 mL via INTRAVENOUS
  Filled 2021-04-15: qty 10

## 2021-04-15 MED ORDER — SODIUM CHLORIDE 0.9% FLUSH: 3.0000 mL | Freq: Two times a day (BID) | INTRAVENOUS | Status: DC

## 2021-04-15 MED ORDER — TICAGRELOR 90 MG PO TABS
ORAL_TABLET | ORAL | Status: AC
  Filled 2021-04-15: qty 2

## 2021-04-15 MED ORDER — HEPARIN (PORCINE) IN NACL 1000-0.9 UT/500ML-% IV SOLN
INTRAVENOUS | Status: AC
  Filled 2021-04-15: qty 1000

## 2021-04-15 MED ORDER — CANGRELOR BOLUS VIA INFUSION
INTRAVENOUS | Status: DC | PRN
  Administered 2021-04-15: 3606 ug via INTRAVENOUS

## 2021-04-15 MED ORDER — MORPHINE SULFATE (PF) 2 MG/ML IV SOLN: 2.0000 mg | INTRAVENOUS | Status: DC | PRN

## 2021-04-15 MED ORDER — SODIUM CHLORIDE 0.9 % WEIGHT BASED INFUSION: 1.0000 mL/kg/h | INTRAVENOUS | Status: DC

## 2021-04-15 MED ORDER — LABETALOL HCL 5 MG/ML IV SOLN: 10.0000 mg | INTRAVENOUS | Status: AC | PRN

## 2021-04-15 MED ORDER — SODIUM CHLORIDE 0.9 % IV SOLN
4.0000 ug/kg/min | INTRAVENOUS | Status: AC
  Administered 2021-04-15: 4 ug/kg/min via INTRAVENOUS
  Filled 2021-04-15 (×2): qty 50

## 2021-04-15 MED ORDER — SODIUM CHLORIDE 0.9 % IV SOLN
INTRAVENOUS | Status: AC | PRN
  Administered 2021-04-15: 4 ug/kg/min via INTRAVENOUS

## 2021-04-15 MED ORDER — SODIUM CHLORIDE 0.9% FLUSH: 3.0000 mL | INTRAVENOUS | Status: DC | PRN

## 2021-04-15 MED ORDER — IOHEXOL 350 MG/ML SOLN
INTRAVENOUS | Status: DC | PRN
  Administered 2021-04-15: 120 mL

## 2021-04-15 MED ORDER — HYDRALAZINE HCL 20 MG/ML IJ SOLN: 10.0000 mg | INTRAMUSCULAR | Status: AC | PRN

## 2021-04-15 MED ORDER — FENTANYL CITRATE (PF) 100 MCG/2ML IJ SOLN
INTRAMUSCULAR | Status: AC
  Filled 2021-04-15: qty 2

## 2021-04-15 MED ORDER — VERAPAMIL HCL 2.5 MG/ML IV SOLN
INTRA_ARTERIAL | Status: DC | PRN
  Administered 2021-04-15: 15 mL via INTRA_ARTERIAL

## 2021-04-15 MED ORDER — OSMOLITE 1.2 CAL PO LIQD
237.0000 mL | Freq: Four times a day (QID) | ORAL | Status: DC
  Administered 2021-04-15 – 2021-04-16 (×2): 237 mL
  Filled 2021-04-15 (×8): qty 237

## 2021-04-15 MED ORDER — ACETAMINOPHEN 650 MG RE SUPP: 650.0000 mg | Freq: Four times a day (QID) | RECTAL | Status: DC | PRN

## 2021-04-15 MED ORDER — ASPIRIN 81 MG PO CHEW
81.0000 mg | CHEWABLE_TABLET | Freq: Every day | ORAL | Status: DC
  Filled 2021-04-15: qty 1

## 2021-04-15 MED ORDER — VERAPAMIL HCL 2.5 MG/ML IV SOLN
INTRAVENOUS | Status: AC
  Filled 2021-04-15: qty 2

## 2021-04-15 MED ORDER — SODIUM CHLORIDE 0.9 % IV SOLN: INTRAVENOUS | Status: AC

## 2021-04-15 MED ORDER — SODIUM CHLORIDE 0.9 % WEIGHT BASED INFUSION
3.0000 mL/kg/h | INTRAVENOUS | Status: DC
  Administered 2021-04-15: 3 mL/kg/h via INTRAVENOUS

## 2021-04-15 MED ORDER — HEPARIN SODIUM (PORCINE) 1000 UNIT/ML IJ SOLN
INTRAMUSCULAR | Status: DC | PRN
  Administered 2021-04-15: 7000 [IU] via INTRAVENOUS
  Administered 2021-04-15: 6000 [IU] via INTRAVENOUS
  Administered 2021-04-15: 2000 [IU] via INTRAVENOUS

## 2021-04-15 MED ORDER — BENEPROTEIN PO POWD
1.0000 | ORAL | Status: DC
  Administered 2021-04-16: 6 g
  Filled 2021-04-15 (×2): qty 227

## 2021-04-15 MED ORDER — LIDOCAINE HCL (PF) 1 % IJ SOLN
INTRAMUSCULAR | Status: AC
  Filled 2021-04-15: qty 30

## 2021-04-15 MED ORDER — LOSARTAN POTASSIUM 25 MG PO TABS
25.0000 mg | ORAL_TABLET | Freq: Every day | ORAL | Status: DC
  Administered 2021-04-15 – 2021-04-16 (×2): 25 mg via JEJUNOSTOMY
  Filled 2021-04-15 (×2): qty 1

## 2021-04-15 SURGICAL SUPPLY — 18 items
BALLN SAPPHIRE 2.0X12 (BALLOONS) ×2
BALLN SAPPHIRE ~~LOC~~ 3.25X15 (BALLOONS) ×1 IMPLANT
BALLOON SAPPHIRE 2.0X12 (BALLOONS) IMPLANT
CATH INFINITI 5 FR JL3.5 (CATHETERS) ×1 IMPLANT
CATH OPTITORQUE TIG 4.0 5F (CATHETERS) ×1 IMPLANT
CATH VISTA GUIDE 6FR XBLAD3.0 (CATHETERS) ×1 IMPLANT
DEVICE RAD COMP TR BAND LRG (VASCULAR PRODUCTS) ×1 IMPLANT
GLIDESHEATH SLEND A-KIT 6F 22G (SHEATH) ×1 IMPLANT
GUIDEWIRE INQWIRE 1.5J.035X260 (WIRE) IMPLANT
INQWIRE 1.5J .035X260CM (WIRE) ×2
KIT HEART LEFT (KITS) ×2 IMPLANT
PACK CARDIAC CATHETERIZATION (CUSTOM PROCEDURE TRAY) ×2 IMPLANT
SHEATH PROBE COVER 6X72 (BAG) ×1 IMPLANT
STENT ONYX FRONTIER 3.0X18 (Permanent Stent) ×1 IMPLANT
TRANSDUCER W/STOPCOCK (MISCELLANEOUS) ×2 IMPLANT
TUBING CIL FLEX 10 FLL-RA (TUBING) ×2 IMPLANT
WIRE ASAHI PROWATER 180CM (WIRE) ×1 IMPLANT
WIRE HI TORQ VERSACORE-J 145CM (WIRE) ×1 IMPLANT

## 2021-04-15 NOTE — Progress Notes (Signed)
PROGRESS NOTE    Hector Curry  GEZ:[id number] DOB: [birth year] DOA: 04/14/2021 PCP: Marguarite Arbour, MD    Brief Narrative:  60 y.o. male who is a former smoker with past medical history relevant for obesity, OSA, paroxysmal atrial fibrillation, HTN, who underwent difficult laparoscopic conversion of laparoscopic adjustable gastric band to gastric bypass on 02/18/2021.  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 This was unable to be traversed with an endoscope, so he underwent laparoscopic remnant gastrostomy tube placement on 03/20/2021.   Patient presented to the emergency room with postcoital chest pain.  No fever or chills.  No nausea vomiting or diarrhea.  In the emergency room hemodynamically stable.  Twelve-lead EKG showed sinus rhythm with right bundle branch block and left posterior fascicular block.  Troponins [phone].  Discussed with cardiology, started on heparin infusion and admitted to the cardiology unit.  Surgery also notified, okay for anticoagulation.  Assessment & Plan:   Principal Problem:   NSTEMI (non-ST elevated myocardial infarction) (HCC) Active Problems:   Obesity   Hyperlipidemia   Family history of premature CAD   Chest pain   Type II diabetes mellitus, uncontrolled (HCC)   History of Roux-en-Y gastric bypass  Non-ST elevation MI: Patient with multiple cardiovascular risk. Patient currently remains on aspirin, metoprolol and Lipitor.  Remains on heparin drip.  Followed by cardiology and anticipating cardiac catheterization today.  Obesity and obstructive sleep apnea: CPAP nightly.  He is using himself.  Recent complicated surgery, gastric band to gastric bypass and stricture near the GJ anastomosis.  Currently with PEG tube feeding.  Will resume after procedure.  N.p.o. now.  Type 2 diabetes: Known A1c 7.3.  Uncontrolled with hyperglycemia.  Using sliding scale insulin while he is NPO.  Hypertension: Stable now.  On metoprolol.  Holding  hydrochlorothiazide and Cardizem.  Paroxysmal atrial fibrillation: Currently in sinus rhythm.  Hypokalemia/hypomagnesemia: Replace.  Holding hydrochlorothiazide.   DVT prophylaxis: SCDs Start: 04/14/21 1840 Place TED hose Start: 04/14/21 1840 SCDs Start: 04/14/21 1730 Place TED hose Start: 04/14/21 1730   Code Status: Full code Family Communication: Wife at the bedside Disposition Plan: Status is: Inpatient  Remains inpatient appropriate because:IV treatments appropriate due to intensity of illness or inability to take PO and Inpatient level of care appropriate due to severity of illness  Dispo: The patient is from: Home              Anticipated d/c is to: Home              Patient currently is not medically stable to d/c.   Difficult to place patient No         Consultants:  Cardiology  Procedures:  None  Antimicrobials:  None   Subjective: Patient was seen and examined.  Overnight he had an episode of palpitation that corresponds to his short run of nonsustained VT on the monitor.  Currently denies any chest pain or radiating pain.  Wife at the bedside.  Objective: Vitals:   04/15/21 0625 04/15/21 0700 04/15/21 1100 04/15/21 1154  BP: 116/68 115/69 116/81 116/81  Pulse: 68 74 73 79  Resp: 15 17 16    Temp: 98.2 F (36.8 C) (!) 97.4 F (36.3 C) 98.8 F (37.1 C)   TempSrc: Oral Oral Oral   SpO2: 96% 95% 96%   Weight:      Height:       No intake or output data in the 24 hours ending 04/15/21 1401 Filed Weights  04/14/21 1218  Weight: 120.2 kg    Examination:  General exam: Appears calm and comfortable , patient on room air. Respiratory system: Clear to auscultation. Respiratory effort normal.  No added sounds. Cardiovascular system: S1 & S2 heard, RRR. No JVD, murmurs, rubs, gallops or clicks. No pedal edema. Gastrointestinal system: Soft.  Nontender.  Obese and pendulous.  G-tube present and nontender. Central nervous system: Alert and oriented.  No focal neurological deficits. Extremities: Symmetric 5 x 5 power. Skin: No rashes, lesions or ulcers Psychiatry: Judgement and insight appear normal. Mood & affect appropriate.     Data Reviewed: I have personally reviewed following labs and imaging studies  CBC: Recent Labs  Lab 04/14/21 1222 04/15/21 0731 04/15/21 1118  WBC 8.6 5.5 6.7  HGB 15.4 13.7 14.5  HCT 47.3 42.1 44.1  MCV 89.2 87.9 87.3  PLT 231 152 188   Basic Metabolic Panel: Recent Labs  Lab 04/14/21 1222 04/14/21 2023 04/15/21 0731 04/15/21 1118  NA 133*  --  135 136  K 3.4*  --  3.9 4.2  CL 101  --  99 102  CO2 25  --  28 23  GLUCOSE 170*  --  150* 152*  BUN 9  --  6 5*  CREATININE 0.68  --  0.61 0.61  CALCIUM 8.9  --  9.0 9.3  MG  --  1.9  --   --    GFR: Estimated Creatinine Clearance: 138.7 mL/min (by C-G formula based on SCr of 0.61 mg/dL). Liver Function Tests: Recent Labs  Lab 04/15/21 0731  AST 61*  ALT 36  ALKPHOS 60  BILITOT 0.6  PROT 6.7  ALBUMIN 3.1*   No results for input(s): LIPASE, AMYLASE in the last 168 hours. No results for input(s): AMMONIA in the last 168 hours. Coagulation Profile: Recent Labs  Lab 04/15/21 0731  INR 1.2   Cardiac Enzymes: No results for input(s): CKTOTAL, CKMB, CKMBINDEX, TROPONINI in the last 168 hours. BNP (last 3 results) No results for input(s): PROBNP in the last 8760 hours. HbA1C: No results for input(s): HGBA1C in the last 72 hours. CBG: Recent Labs  Lab 04/14/21 2138 04/15/21 0736 04/15/21 1142  GLUCAP 147* 154* 170*   Lipid Profile: Recent Labs    04/15/21 0731  CHOL 141  HDL 26*  LDLCALC 93  TRIG 355  CHOLHDL 5.4   Thyroid Function Tests: No results for input(s): TSH, T4TOTAL, FREET4, T3FREE, THYROIDAB in the last 72 hours. Anemia Panel: No results for input(s): VITAMINB12, FOLATE, FERRITIN, TIBC, IRON, RETICCTPCT in the last 72 hours. Sepsis Labs: No results for input(s): PROCALCITON, LATICACIDVEN in the last 168  hours.  Recent Results (from the past 240 hour(s))  Resp Panel by RT-PCR (Flu A&B, Covid) Nasopharyngeal Swab     Status: None   Collection Time: 04/14/21  4:33 PM   Specimen: Nasopharyngeal Swab; Nasopharyngeal(NP) swabs in vial transport medium  Result Value Ref Range Status   SARS Coronavirus 2 by RT PCR NEGATIVE NEGATIVE Final    Comment: (NOTE) SARS-CoV-2 target nucleic acids are NOT DETECTED.  The SARS-CoV-2 RNA is generally detectable in upper respiratory specimens during the acute phase of infection. The lowest concentration of SARS-CoV-2 viral copies this assay can detect is 138 copies/mL. A negative result does not preclude SARS-Cov-2 infection and should not be used as the sole basis for treatment or other patient management decisions. A negative result may occur with  improper specimen collection/handling, submission of specimen other than nasopharyngeal swab, presence  of viral mutation(s) within the areas targeted by this assay, and inadequate number of viral copies(<138 copies/mL). A negative result must be combined with clinical observations, patient history, and epidemiological information. The expected result is Negative.  Fact Sheet for Patients:  BloggerCourse.com  Fact Sheet for Healthcare Providers:  SeriousBroker.it  This test is no t yet approved or cleared by the [place] FDA and  has been authorized for detection and/or diagnosis of SARS-CoV-2 by FDA under an Emergency Use Authorization (EUA). This EUA will remain  in effect (meaning this test can be used) for the duration of the COVID-19 declaration under Section 564(b)(1) of the Act, 21 U.S.C.section 360bbb-3(b)(1), unless the authorization is terminated  or revoked sooner.       Influenza A by PCR NEGATIVE NEGATIVE Final   Influenza B by PCR NEGATIVE NEGATIVE Final    Comment: (NOTE) The Xpert Xpress SARS-CoV-2/FLU/RSV plus assay is intended  as an aid in the diagnosis of influenza from Nasopharyngeal swab specimens and should not be used as a sole basis for treatment. Nasal washings and aspirates are unacceptable for Xpert Xpress SARS-CoV-2/FLU/RSV testing.  Fact Sheet for Patients: BloggerCourse.com  Fact Sheet for Healthcare Providers: SeriousBroker.it  This test is not yet approved or cleared by the [place] FDA and has been authorized for detection and/or diagnosis of SARS-CoV-2 by FDA under an Emergency Use Authorization (EUA). This EUA will remain in effect (meaning this test can be used) for the duration of the COVID-19 declaration under Section 564(b)(1) of the Act, 21 U.S.C. section 360bbb-3(b)(1), unless the authorization is terminated or revoked.  Performed at [facility], [address], [place], [place] [address]          Radiology Studies: DG Chest 2 View  Result Date: 04/14/2021 CLINICAL DATA:  Right-sided chest pain. EXAM: CHEST - 2 VIEW COMPARISON:  11/08/2019 FINDINGS: The heart size and mediastinal contours are within normal limits. Both lungs are clear. The visualized skeletal structures are unremarkable. IMPRESSION: No active cardiopulmonary disease. Electronically Signed   By: Danae Orleans M.D.   On: 04/14/2021 13:06   CT Chest W Contrast  Result Date: 04/14/2021 CLINICAL DATA:  Status post Roux-en-Y gastric bypass NG tube placement, chest pain, vomiting, concern for surgical complication EXAM: CT CHEST AND ABDOMEN WITH CONTRAST TECHNIQUE: Multidetector CT imaging of the chest and abdomen was performed following the standard protocol during bolus administration of intravenous contrast. CONTRAST:  80mL OMNIPAQUE IOHEXOL 350 MG/ML SOLN, additional oral enteric contrast COMPARISON:  None. FINDINGS: CT CHEST FINDINGS Cardiovascular: Scattered aortic atherosclerosis. Normal heart size. Three-vessel coronary artery calcifications. No pericardial  effusion. Mediastinum/Nodes: No enlarged mediastinal, hilar, or axillary lymph nodes. Thyroid gland, trachea, and esophagus demonstrate no significant findings. Lungs/Pleura: Mild, diffuse bilateral bronchial wall thickening. Background of very fine centrilobular nodules throughout the lungs, most concentrated in the apices. No pleural effusion or pneumothorax. Musculoskeletal: No chest wall mass or suspicious bone lesions identified. CT ABDOMEN FINDINGS Hepatobiliary: No focal liver abnormality is seen. Somewhat coarse, nodular contour of the liver. Status post cholecystectomy. No biliary ductal dilatation. Pancreas: Unremarkable. No pancreatic ductal dilatation or surrounding inflammatory changes. Spleen: Normal in size without focal abnormality. Adrenals/Urinary Tract: Adrenal glands are unremarkable. Kidneys are normal, without renal calculi, focal lesion, or hydronephrosis. Bladder is unremarkable. Stomach/Bowel: Postoperative findings of Roux-en-Y type gastric bypass with additional percutaneous gastrostomy of the gastric remnant. The gastric remnant is fluid-filled although not distended. Appendix appears normal. No evidence of bowel wall thickening, distention, or inflammatory changes.  Vascular/Lymphatic: Aortic atherosclerosis. No enlarged abdominal or pelvic lymph nodes. Other: No abdominal wall hernia or abnormality. Minimal residua of a prior right rectus sheath hematoma (series 2, image 80) no abdominopelvic ascites. Musculoskeletal: No acute or significant osseous findings. IMPRESSION: 1. Postoperative findings of Roux-en-Y type gastric bypass with additional percutaneous gastrostomy of the gastric remnant. The gastric remnant is fluid-filled although not distended. 2. No evidence of bowel obstruction or other surgical complication. 3. Somewhat coarse, nodular contour of the liver, suggestive of cirrhosis. 4. Background of very fine centrilobular nodules throughout the lungs, most concentrated in the  apices, most consistent with smoking-related respiratory bronchiolitis. 5. Minimal residua of a prior right rectus sheath hematoma. 6. Coronary artery disease. Aortic Atherosclerosis (ICD10-I70.0). Electronically Signed   By: Lauralyn Primes M.D.   On: 04/14/2021 15:22   CT ABDOMEN W CONTRAST  Result Date: 04/14/2021 CLINICAL DATA:  Status post Roux-en-Y gastric bypass NG tube placement, chest pain, vomiting, concern for surgical complication EXAM: CT CHEST AND ABDOMEN WITH CONTRAST TECHNIQUE: Multidetector CT imaging of the chest and abdomen was performed following the standard protocol during bolus administration of intravenous contrast. CONTRAST:  22mL OMNIPAQUE IOHEXOL 350 MG/ML SOLN, additional oral enteric contrast COMPARISON:  None. FINDINGS: CT CHEST FINDINGS Cardiovascular: Scattered aortic atherosclerosis. Normal heart size. Three-vessel coronary artery calcifications. No pericardial effusion. Mediastinum/Nodes: No enlarged mediastinal, hilar, or axillary lymph nodes. Thyroid gland, trachea, and esophagus demonstrate no significant findings. Lungs/Pleura: Mild, diffuse bilateral bronchial wall thickening. Background of very fine centrilobular nodules throughout the lungs, most concentrated in the apices. No pleural effusion or pneumothorax. Musculoskeletal: No chest wall mass or suspicious bone lesions identified. CT ABDOMEN FINDINGS Hepatobiliary: No focal liver abnormality is seen. Somewhat coarse, nodular contour of the liver. Status post cholecystectomy. No biliary ductal dilatation. Pancreas: Unremarkable. No pancreatic ductal dilatation or surrounding inflammatory changes. Spleen: Normal in size without focal abnormality. Adrenals/Urinary Tract: Adrenal glands are unremarkable. Kidneys are normal, without renal calculi, focal lesion, or hydronephrosis. Bladder is unremarkable. Stomach/Bowel: Postoperative findings of Roux-en-Y type gastric bypass with additional percutaneous gastrostomy of the  gastric remnant. The gastric remnant is fluid-filled although not distended. Appendix appears normal. No evidence of bowel wall thickening, distention, or inflammatory changes. Vascular/Lymphatic: Aortic atherosclerosis. No enlarged abdominal or pelvic lymph nodes. Other: No abdominal wall hernia or abnormality. Minimal residua of a prior right rectus sheath hematoma (series 2, image 80) no abdominopelvic ascites. Musculoskeletal: No acute or significant osseous findings. IMPRESSION: 1. Postoperative findings of Roux-en-Y type gastric bypass with additional percutaneous gastrostomy of the gastric remnant. The gastric remnant is fluid-filled although not distended. 2. No evidence of bowel obstruction or other surgical complication. 3. Somewhat coarse, nodular contour of the liver, suggestive of cirrhosis. 4. Background of very fine centrilobular nodules throughout the lungs, most concentrated in the apices, most consistent with smoking-related respiratory bronchiolitis. 5. Minimal residua of a prior right rectus sheath hematoma. 6. Coronary artery disease. Aortic Atherosclerosis (ICD10-I70.0). Electronically Signed   By: Lauralyn Primes M.D.   On: 04/14/2021 15:22        Scheduled Meds:  aspirin  81 mg Per Tube Daily   atorvastatin  40 mg Oral q1800   docusate  100 mg Per Tube BID   feeding supplement (PROSource TF)  45 mL Per Tube BID   free water  60 mL Per Tube Q8H   insulin aspart  0-5 Units Subcutaneous QHS   insulin aspart  0-9 Units Subcutaneous TID WC   losartan  25 mg Per  J Tube Daily   metoprolol tartrate  50 mg Per Tube BID   multivitamin with minerals  1 tablet Oral Daily   nitroGLYCERIN  0.4 mg Sublingual Once   pantoprazole sodium  40 mg Per Tube Daily   potassium chloride  20 mEq Per Tube Daily   [START ON 04/16/2021] protein supplement  1 Scoop Per Tube Q3H while awake   sodium chloride flush  3 mL Intravenous Q12H   Continuous Infusions:  sodium chloride 1 mL/kg/hr (04/15/21 1329)    heparin Stopped (04/15/21 1343)     LOS: 1 day    Time spent: 32 minutes    Dorcas CarrowKuber Mekel Haverstock, MD [facility] Pager [phone]

## 2021-04-15 NOTE — H&P (View-Only) (Signed)
Progress Note  Patient Name: Hector Curry Date of Encounter: 04/15/2021  [facility] Cardiologist: Julien Nordmann, MD   Subjective   Patient states he is feeling well currently, recalls having midsternal chest pain radiating to right side of arm after having intercourse with his wife at 10:30 AM. He walked around in the house, which did not worsen the chest pain, back pain was persistent and lasted over 1 hour.  He did not have any associated shortness of breath, dizziness, syncope, nausea.   He has lost significant amount of weight after gastric bypass surgery, due to inability to eat .  He has a plan to meet one of the gastroenterology specialist for stricture dilation and hoping to eliminate G-tube feeds.    Inpatient Medications    Scheduled Meds:  aspirin  81 mg Per Tube Daily   atorvastatin  40 mg Oral q1800   docusate  100 mg Per Tube BID   feeding supplement (PROSource TF)  45 mL Per Tube BID   free water  60 mL Per Tube Q8H   insulin aspart  0-5 Units Subcutaneous QHS   insulin aspart  0-9 Units Subcutaneous TID WC   losartan  25 mg Per J Tube Daily   metoprolol tartrate  50 mg Per Tube BID   multivitamin with minerals  1 tablet Oral Daily   nitroGLYCERIN  0.4 mg Sublingual Once   pantoprazole sodium  40 mg Per Tube Daily   potassium chloride  20 mEq Per Tube Daily   [START ON 04/16/2021] protein supplement  1 Scoop Per Tube Q3H while awake   sodium chloride flush  3 mL Intravenous Q12H   sodium chloride flush  3 mL Intravenous Q12H   sodium chloride flush  3 mL Intravenous Q12H   sodium chloride flush  3 mL Intravenous Q12H   Continuous Infusions:  sodium chloride     sodium chloride     heparin 1,650 Units/hr (04/15/21 0709)   PRN Meds: sodium chloride, sodium chloride, acetaminophen **OR** acetaminophen, bisacodyl, morphine injection, ondansetron **OR** ondansetron (ZOFRAN) IV, polyethylene glycol, sodium chloride flush, sodium chloride flush   Vital  Signs    Vitals:   04/14/21 2100 04/15/21 0009 04/15/21 0625 04/15/21 0700  BP: (!) 152/92  116/68 115/69  Pulse: 79 70 68 74  Resp: (!) 24  15 17   Temp:  98.2 F (36.8 C) 98.2 F (36.8 C) (!) 97.4 F (36.3 C)  TempSrc:  Oral Oral Oral  SpO2: 94%  96% 95%  Weight:      Height:       No intake or output data in the 24 hours ending 04/15/21 0916 Last 3 Weights 04/14/2021 03/27/2021 03/25/2021  Weight (lbs) 265 lb 292 lb 5.3 oz 292 lb 5.3 oz  Weight (kg) 120.203 kg 132.6 kg 132.6 kg      Telemetry    Sinus rhythm with ventricular rate of 80s, noted NSVT x7 runs over the past 24 hours- Personally Reviewed  ECG    No new tracing this AM- Personally Reviewed  Physical Exam   GEN: No acute distress.  Sitting in bed Neck: Short and thick neck, difficult assess JVD Cardiac: RRR, no murmurs, rubs, or gallops.  Respiratory: Clear to auscultation bilaterally.  On room air, speak full sentence GI: Abdomen obese, soft, nontender, non-distended ; G-tube in place, not in use currently MS: No bilateral lower extremity edema; No deformity. Neuro: Alert and oriented x3, no cognitive deficit, follow commands appropriately Psych:  Normal affect   Labs    High Sensitivity Troponin:   Recent Labs  Lab 04/14/21 1222 04/14/21 1358 04/14/21 1612  TROPONINIHS 7 192* 3,399*      Chemistry Recent Labs  Lab 04/14/21 1222  NA 133*  K 3.4*  CL 101  CO2 25  GLUCOSE 170*  BUN 9  CREATININE 0.68  CALCIUM 8.9  GFRNONAA >60  ANIONGAP 7     Hematology Recent Labs  Lab 04/14/21 1222  WBC 8.6  RBC 5.30  HGB 15.4  HCT 47.3  MCV 89.2  MCH 29.1  MCHC 32.6  RDW 14.6  PLT 231    BNPNo results for input(s): BNP, PROBNP in the last 168 hours.   DDimer No results for input(s): DDIMER in the last 168 hours.   Radiology    DG Chest 2 View  Result Date: 04/14/2021 CLINICAL DATA:  Right-sided chest pain. EXAM: CHEST - 2 VIEW COMPARISON:  11/08/2019 FINDINGS: The heart size and  mediastinal contours are within normal limits. Both lungs are clear. The visualized skeletal structures are unremarkable. IMPRESSION: No active cardiopulmonary disease. Electronically Signed   By: Danae Orleans M.D.   On: 04/14/2021 13:06   CT Chest W Contrast  Result Date: 04/14/2021 CLINICAL DATA:  Status post Roux-en-Y gastric bypass NG tube placement, chest pain, vomiting, concern for surgical complication EXAM: CT CHEST AND ABDOMEN WITH CONTRAST TECHNIQUE: Multidetector CT imaging of the chest and abdomen was performed following the standard protocol during bolus administration of intravenous contrast. CONTRAST:  37mL OMNIPAQUE IOHEXOL 350 MG/ML SOLN, additional oral enteric contrast COMPARISON:  None. FINDINGS: CT CHEST FINDINGS Cardiovascular: Scattered aortic atherosclerosis. Normal heart size. Three-vessel coronary artery calcifications. No pericardial effusion. Mediastinum/Nodes: No enlarged mediastinal, hilar, or axillary lymph nodes. Thyroid gland, trachea, and esophagus demonstrate no significant findings. Lungs/Pleura: Mild, diffuse bilateral bronchial wall thickening. Background of very fine centrilobular nodules throughout the lungs, most concentrated in the apices. No pleural effusion or pneumothorax. Musculoskeletal: No chest wall mass or suspicious bone lesions identified. CT ABDOMEN FINDINGS Hepatobiliary: No focal liver abnormality is seen. Somewhat coarse, nodular contour of the liver. Status post cholecystectomy. No biliary ductal dilatation. Pancreas: Unremarkable. No pancreatic ductal dilatation or surrounding inflammatory changes. Spleen: Normal in size without focal abnormality. Adrenals/Urinary Tract: Adrenal glands are unremarkable. Kidneys are normal, without renal calculi, focal lesion, or hydronephrosis. Bladder is unremarkable. Stomach/Bowel: Postoperative findings of Roux-en-Y type gastric bypass with additional percutaneous gastrostomy of the gastric remnant. The gastric remnant  is fluid-filled although not distended. Appendix appears normal. No evidence of bowel wall thickening, distention, or inflammatory changes. Vascular/Lymphatic: Aortic atherosclerosis. No enlarged abdominal or pelvic lymph nodes. Other: No abdominal wall hernia or abnormality. Minimal residua of a prior right rectus sheath hematoma (series 2, image 80) no abdominopelvic ascites. Musculoskeletal: No acute or significant osseous findings. IMPRESSION: 1. Postoperative findings of Roux-en-Y type gastric bypass with additional percutaneous gastrostomy of the gastric remnant. The gastric remnant is fluid-filled although not distended. 2. No evidence of bowel obstruction or other surgical complication. 3. Somewhat coarse, nodular contour of the liver, suggestive of cirrhosis. 4. Background of very fine centrilobular nodules throughout the lungs, most concentrated in the apices, most consistent with smoking-related respiratory bronchiolitis. 5. Minimal residua of a prior right rectus sheath hematoma. 6. Coronary artery disease. Aortic Atherosclerosis (ICD10-I70.0). Electronically Signed   By: Lauralyn Primes M.D.   On: 04/14/2021 15:22   CT ABDOMEN W CONTRAST  Result Date: 04/14/2021 CLINICAL DATA:  Status post Roux-en-Y gastric  bypass NG tube placement, chest pain, vomiting, concern for surgical complication EXAM: CT CHEST AND ABDOMEN WITH CONTRAST TECHNIQUE: Multidetector CT imaging of the chest and abdomen was performed following the standard protocol during bolus administration of intravenous contrast. CONTRAST:  72mL OMNIPAQUE IOHEXOL 350 MG/ML SOLN, additional oral enteric contrast COMPARISON:  None. FINDINGS: CT CHEST FINDINGS Cardiovascular: Scattered aortic atherosclerosis. Normal heart size. Three-vessel coronary artery calcifications. No pericardial effusion. Mediastinum/Nodes: No enlarged mediastinal, hilar, or axillary lymph nodes. Thyroid gland, trachea, and esophagus demonstrate no significant findings.  Lungs/Pleura: Mild, diffuse bilateral bronchial wall thickening. Background of very fine centrilobular nodules throughout the lungs, most concentrated in the apices. No pleural effusion or pneumothorax. Musculoskeletal: No chest wall mass or suspicious bone lesions identified. CT ABDOMEN FINDINGS Hepatobiliary: No focal liver abnormality is seen. Somewhat coarse, nodular contour of the liver. Status post cholecystectomy. No biliary ductal dilatation. Pancreas: Unremarkable. No pancreatic ductal dilatation or surrounding inflammatory changes. Spleen: Normal in size without focal abnormality. Adrenals/Urinary Tract: Adrenal glands are unremarkable. Kidneys are normal, without renal calculi, focal lesion, or hydronephrosis. Bladder is unremarkable. Stomach/Bowel: Postoperative findings of Roux-en-Y type gastric bypass with additional percutaneous gastrostomy of the gastric remnant. The gastric remnant is fluid-filled although not distended. Appendix appears normal. No evidence of bowel wall thickening, distention, or inflammatory changes. Vascular/Lymphatic: Aortic atherosclerosis. No enlarged abdominal or pelvic lymph nodes. Other: No abdominal wall hernia or abnormality. Minimal residua of a prior right rectus sheath hematoma (series 2, image 80) no abdominopelvic ascites. Musculoskeletal: No acute or significant osseous findings. IMPRESSION: 1. Postoperative findings of Roux-en-Y type gastric bypass with additional percutaneous gastrostomy of the gastric remnant. The gastric remnant is fluid-filled although not distended. 2. No evidence of bowel obstruction or other surgical complication. 3. Somewhat coarse, nodular contour of the liver, suggestive of cirrhosis. 4. Background of very fine centrilobular nodules throughout the lungs, most concentrated in the apices, most consistent with smoking-related respiratory bronchiolitis. 5. Minimal residua of a prior right rectus sheath hematoma. 6. Coronary artery disease.  Aortic Atherosclerosis (ICD10-I70.0). Electronically Signed   By: Lauralyn Primes M.D.   On: 04/14/2021 15:22    Cardiac Studies   Echocardiogram from 04/01/2019:   1. The left ventricle has normal systolic function, with an ejection  fraction of 55-60%. The cavity size was normal. Left ventricular diastolic  parameters were normal.   2. The right ventricle has normal systolic function. The cavity was  normal. There is no increase in right ventricular wall thickness. Right  ventricular systolic pressure could not be assessed.   3. Left atrial size was mildly dilated.   4. The mitral valve is grossly normal.   5. The tricuspid valve is grossly normal.   6. The aortic valve is grossly normal. No stenosis of the aortic valve.   7. The aorta is abnormal in size and structure.   8. There is mild to moderate dilatation of the aortic root and of the  ascending aorta measuring 42 mm.    Left heart catheterization from 05/08/2015:  The left ventricular systolic function is normal. No significant CAD.   Continue preventive therapy and risk factor modification.    Patient Profile     Hector Curry is a 60 y.o. male with a hx of nonobstructive CAD, hypertension, hyperlipidemia, obesity, type 2 diabetes, OSA on BiPAP, dilated aortic root, paroxysmal atrial tachycardia, formal tobacco use, s/p lap band transitioned to gastric bypass 02/18/2021 with subsequent development of GJ anastomosis stricture and gastrostomy tube placement 03/20/2021, cardiology  is following since 04/14/2021 for non-STEMI.  Assessment & Plan     Non-STEMI Nonobstructive CAD -Presented with chest pain radiating to right arm after having intercourse, lasted about 1 hour from 04/14/21 1030 AM -High sensitive troponin 7 > 192 > 399 -EKG showed sinus rhythm, ST depression V2, old RBBB -Echocardiogram is pending -N.p.o. since midnight, planned for cardiac catheterization today, check BMP today -Medical therapy: Metoprolol  uptitrated to 50 mg twice daily, initiated losartan 12.5 mg daily, continue aspirin 81 mg daily, continue heparin infusion  NSVT -Metoprolol uptitrated, optimize electrolytes, BMP pending today  Hypertension -BP is controlled, diltiazem 60 mg 3 times daily, metoprolol 25 mg twice daily at home -Metoprolol uptitrated to 50 mg twice daily, added losartan 25 mg daily, diltiazem held currently -Continue trend BP  Hyperlipidemia - LDL 101 on 03/06/21, historically on fenofibrate due to transaminitis, LFTs WNL from 03/19/2021, started on lipitor 40 mg daily here  Paroxysmal atrial tachycardia/SVT -Historically on metoprolol and diltiazem for rate control, currently in sinus rhythm  Dilated aortic root -4.2 cm from August 2020 echocardiogram, his echo is pending today, continue outpatient follow-up  Type 2 diabetes Obesity Recent gastric bypass surgery with GJ anastomosis stricture and G-tube placement OSA -Managed per IM  For questions or updates, please contact [facility] Please consult [email or web address] for contact info under     Signed, Cyndi BenderXika Zhao, NP  04/15/2021, 9:16 AM    Patient seen, examined. Available data reviewed. Agree with findings, assessment, and plan as outlined by Cyndi BenderXika Zhao, NP.  On exam he is alert, oriented, in no distress.  Lungs are clear, heart is regular rate and rhythm with no murmur or gallop, abdomen is soft and nontender.  G-tube is in place without surrounding erythema.  Extremities have no edema.  Telemetry is reviewed and shows normal sinus rhythm with 1 short run of nonsustained VT (7 beats).  All available labs and imaging data is reviewed.  His echocardiogram shows normal LV and RV function with no regional wall motion abnormalities.  He has had a significant non-ST elevation infarction with troponin trend now from [phone].  Cardiac catheterization and PCI are indicated in this patient with multiple cardiovascular risk factors presenting with non-STEMI.  He  is unable to take any oral medications.  All of his medicines are crushed and given in liquid form through his PEG tube.  All of his questions regarding the procedure were answered.  He has had radial heart catheterization in the past and did well with this.  He has a 2+ right radial pulse.  Tonny BollmanMichael Clova Morlock, M.D. 04/15/2021 11:45 AM

## 2021-04-15 NOTE — Progress Notes (Signed)
Initial Nutrition Assessment  DOCUMENTATION CODES:  Non-severe (moderate) malnutrition in context of acute illness/injury, Obesity unspecified  INTERVENTION:  Increase bolus to 4 cartons of Osmolite 1.2 per day to allow for more calories and protein.  New regimen to provide 1324 kcal, 99 grams of protein, and 1908 ml of fluid (including water flushes between meals for hydration).  Continue MVI with minerals.  Continue Beneprotein supplement per tube.  If patient does not tolerate extra carton of Osmolite 1.2, consider changing to 3 cartons daily of Osmolite 1.5 for extra calories and protein.  NUTRITION DIAGNOSIS:  Moderate Malnutrition related to acute illness as evidenced by energy intake < or equal to 75% for > or equal to 1 month, percent weight loss.  GOAL:  Patient will meet greater than or equal to 90% of their needs  MONITOR:  TF tolerance, Labs, Weight trends, I & O's  REASON FOR ASSESSMENT:  New TF (Home TF)    ASSESSMENT:  60 yo male with a PMH of obesity, OSA, paroxysmal atrial fibrillation, HTN, and T2DM who underwent difficult laparoscopic conversion of laparoscopic adjustable gastric band to gastric bypass on 02/18/2021. 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 This was unable to be traversed with an endoscope, so he underwent laparoscopic remnant gastrostomy tube placement on 03/20/2021. Admitted with NSTEMI.  Spoke with pt and wife. Wife confirmed patient's TF regimen of 3 cartons of Osmolite 1.2 daily with Beneprotein at breakfast and lunch.   This regimen provides 1040 kcal, 86 grams protein, and 1714 ml water (extra 240 ml water flushes through tube and with sips PO).  Pt appears to be losing weight too quickly. RD thinks pt should be increased to 1 more carton of TF before bed.   Pt has lost 45 lbs (14.5%) in the last 2 months, which is significant and severe for the time frame.  While it is still patient's goal to lose weight, patient needs to lose  weight a bit slower so that is remains safe and he does not lose too many nutrients during weight loss.  Medications: reviewed; colace BID, SSI, mealtime Novolog, MVI with minerals, Protonix, Klor-Con 20 mEq, Beneprotein every 3 hrs while awake  Labs: reviewed; CBG 122-170  NUTRITION - FOCUSED PHYSICAL EXAM: Flowsheet Row Most Recent Value  Orbital Region No depletion  Upper Arm Region No depletion  Thoracic and Lumbar Region No depletion  Buccal Region Mild depletion  Temple Region Mild depletion  Clavicle Bone Region No depletion  Clavicle and Acromion Bone Region No depletion  Scapular Bone Region No depletion  Dorsal Hand No depletion  Patellar Region No depletion  Anterior Thigh Region No depletion  Posterior Calf Region No depletion  Edema (RD Assessment) None  Hair Reviewed  Eyes Reviewed  Mouth Reviewed  Skin Reviewed  Nails Reviewed   Diet Order:   Diet Order             Diet NPO time specified Except for: Sips with Meds  Diet effective midnight                  EDUCATION NEEDS:  Education needs have been addressed  Skin:  Skin Assessment: Skin Integrity Issues: Skin Integrity Issues:: Incisions Incisions: Abdomen, closed  Last BM:  04/14/21  Height:  Ht Readings from Last 1 Encounters:  04/14/21 6\' 2"  (1.88 m)   Weight:  Wt Readings from Last 1 Encounters:  04/14/21 120.2 kg   BMI:  Body mass index is 34.02 kg/m.  Estimated Nutritional Needs:  Kcal:  1400-1600 Protein:  85-100 grams Fluid:  2 L  Vertell Limber, RD, LDN (she/her/hers) Registered Dietitian I After-Hours/Weekend Pager # in [place]

## 2021-04-15 NOTE — Progress Notes (Signed)
Some of patient's AM labs did not cross over from the main lab interface into Epic. Main lab sent a printed copy of lab results from this AM; this was placed into the shadow chart.

## 2021-04-15 NOTE — Interval H&P Note (Signed)
Cath Lab Visit (complete for each Cath Lab visit)  Clinical Evaluation Leading to the Procedure:   ACS: Yes.    Non-ACS:    Anginal Classification: CCS III  Anti-ischemic medical therapy: Minimal Therapy (1 class of medications)  Non-Invasive Test Results: No non-invasive testing performed  Prior CABG: No previous CABG      History and Physical Interval Note:  04/15/2021 2:18 PM  Hector Curry  has presented today for surgery, with the diagnosis of chest pain.  The various methods of treatment have been discussed with the patient and family. After consideration of risks, benefits and other options for treatment, the patient has consented to  Procedure(s): LEFT HEART CATH AND CORONARY ANGIOGRAPHY (N/A) as a surgical intervention.  The patient's history has been reviewed, patient examined, no change in status, stable for surgery.  I have reviewed the patient's chart and labs.  Questions were answered to the patient's satisfaction.     Nanetta Batty

## 2021-04-15 NOTE — Progress Notes (Addendum)
ANTICOAGULATION CONSULT NOTE   Pharmacy Consult for Heparin Indication: chest pain/ACS  Allergies  Allergen Reactions   Other Other (See Comments)    Cats    Patient Measurements: Height: 6\' 2"  (188 cm) Weight: 120.2 kg (265 lb) IBW/kg (Calculated) : 82.2 HEPARIN DW (KG): 108   Vital Signs: Temp: 98.8 F (37.1 C) (08/22 1100) Temp Source: Oral (08/22 1100) BP: 116/81 (08/22 1154) Pulse Rate: 79 (08/22 1154)  Labs: Recent Labs    04/14/21 1222 04/14/21 1358 04/14/21 1612 04/14/21 2306 04/15/21 0731 04/15/21 1118  HGB 15.4  --   --   --  13.7 14.5  HCT 47.3  --   --   --  42.1 44.1  PLT 231  --   --   --  152 188  LABPROT  --   --   --   --  14.8  --   INR  --   --   --   --  1.2  --   HEPARINUNFRC  --   --   --  0.10* 0.14*  --   CREATININE 0.68  --   --   --  0.61 0.61  TROPONINIHS 7 192* 3,399*  --  6,719*  --     Estimated Creatinine Clearance: 138.7 mL/min (by C-G formula based on SCr of 0.61 mg/dL).   Medical History: Past Medical History:  Diagnosis Date   Allergic rhinitis    Atrial fibrillation (HCC)    Coronary artery disease, non-occlusive    a. 05/08/2015 Cath: no significant CAD, LVEF nl-->Med; b. 07/2017 MV: attenuation artifact, no ischemia, EF 65%-->Low risk.   Diabetes mellitus without complication (HCC)    Diastolic dysfunction    a. 04/2015 Echo: EF 60-65%, mild MR, mildly dil LA, nl RV fxn, nl PASP; b. 05/2016 Echo: EF 60-65%, no rmwa, Gr1 DD, mildly dil Ao root and asc Ao; c. 07/2017 Echo: EF 55-60%, Ao root 48mm. mildly dil LA; d. 03/2019 Echo: EF 55-60%, nl RV fxn. Mild LAE, Ao root/Asc Ao 52mm.   Dilated aortic root (HCC)    a. 07/2017 Echo: 9mm Ao root - mildly dil; b. 03/2019 Echo: Ao root 66mm.   Diverticulosis 10/03/2014   Dysrhythmia    Family history of premature CAD    a. father passed from MI at 42   GERD (gastroesophageal reflux disease)    Hemorrhoids    a. internal hemorrhoids s/p surgery 1999   History of kidney stones     History of tobacco abuse    Hyperlipidemia    Hyperplastic colon polyp 10/03/2014   a. x 2    Hypertension    Inflammatory arthritis    a. CCP antibodies & x-rays negative. Rheumatoid factor 14, felt to be crystaline over RA or psoriatic   Morbid obesity (HCC)    a. s/p LAP-BAND   OSA (obstructive sleep apnea)    a. on CPAP   Osteoarthritis    PSVT (paroxysmal supraventricular tachycardia) (HCC)    a. 48 hr Holter 04/2015: NSR w/ rare PVC, short runs of narrow complex tachycardiac, possible atrial tach, longest run 7 beats, PACs noted (2% of all beats 3600 total) they did not seem to correlate w/ significant arrythmia; b. 08/2017 Event monitor: no significant arrhythmias.    Assessment: Patient presented to ED with chest pain. Also, patient with recent gastric bypass at end of June, MD says okay to anticoagulate. Patient is not on any oral anticoagulants. Elevated troponin, pharmacy asked to start heparin.  Heparin level subtherapeutic (0.14) on gtt at 1650 units/hr. No issues with line or bleeding reported per RN. Awaiting cath today.  Goal of Therapy:  Heparin level 0.3-0.7 units/ml Monitor platelets by anticoagulation protocol: Yes   Plan:  Increase heparin gtt to 1750 units/hr Recheck heparin level in 6 hrs. F/u plans for heparin after cath lab.   Reece Leader, Colon Flattery, BCCP Clinical Pharmacist  04/15/2021 12:23 PM   [facility] pharmacy phone numbers are listed on [email or web address]

## 2021-04-15 NOTE — Progress Notes (Signed)
  Echocardiogram 2D Echocardiogram has been performed.  Janalyn Harder 04/15/2021, 10:37 AM

## 2021-04-15 NOTE — Telephone Encounter (Signed)
Left message on machine to call back  

## 2021-04-15 NOTE — Progress Notes (Addendum)
Progress Note  Patient Name: Hector Curry Date of Encounter: 04/15/2021  [facility] Cardiologist: Julien Nordmann, MD   Subjective   Patient states he is feeling well currently, recalls having midsternal chest pain radiating to right side of arm after having intercourse with his wife at 10:30 AM. He walked around in the house, which did not worsen the chest pain, back pain was persistent and lasted over 1 hour.  He did not have any associated shortness of breath, dizziness, syncope, nausea.   He has lost significant amount of weight after gastric bypass surgery, due to inability to eat .  He has a plan to meet one of the gastroenterology specialist for stricture dilation and hoping to eliminate G-tube feeds.    Inpatient Medications    Scheduled Meds:  aspirin  81 mg Per Tube Daily   atorvastatin  40 mg Oral q1800   docusate  100 mg Per Tube BID   feeding supplement (PROSource TF)  45 mL Per Tube BID   free water  60 mL Per Tube Q8H   insulin aspart  0-5 Units Subcutaneous QHS   insulin aspart  0-9 Units Subcutaneous TID WC   losartan  25 mg Per J Tube Daily   metoprolol tartrate  50 mg Per Tube BID   multivitamin with minerals  1 tablet Oral Daily   nitroGLYCERIN  0.4 mg Sublingual Once   pantoprazole sodium  40 mg Per Tube Daily   potassium chloride  20 mEq Per Tube Daily   [START ON 04/16/2021] protein supplement  1 Scoop Per Tube Q3H while awake   sodium chloride flush  3 mL Intravenous Q12H   sodium chloride flush  3 mL Intravenous Q12H   sodium chloride flush  3 mL Intravenous Q12H   sodium chloride flush  3 mL Intravenous Q12H   Continuous Infusions:  sodium chloride     sodium chloride     heparin 1,650 Units/hr (04/15/21 0709)   PRN Meds: sodium chloride, sodium chloride, acetaminophen **OR** acetaminophen, bisacodyl, morphine injection, ondansetron **OR** ondansetron (ZOFRAN) IV, polyethylene glycol, sodium chloride flush, sodium chloride flush   Vital  Signs    Vitals:   04/14/21 2100 04/15/21 0009 04/15/21 0625 04/15/21 0700  BP: (!) 152/92  116/68 115/69  Pulse: 79 70 68 74  Resp: (!) 24  15 17   Temp:  98.2 F (36.8 C) 98.2 F (36.8 C) (!) 97.4 F (36.3 C)  TempSrc:  Oral Oral Oral  SpO2: 94%  96% 95%  Weight:      Height:       No intake or output data in the 24 hours ending 04/15/21 0916 Last 3 Weights 04/14/2021 03/27/2021 03/25/2021  Weight (lbs) 265 lb 292 lb 5.3 oz 292 lb 5.3 oz  Weight (kg) 120.203 kg 132.6 kg 132.6 kg      Telemetry    Sinus rhythm with ventricular rate of 80s, noted NSVT x7 runs over the past 24 hours- Personally Reviewed  ECG    No new tracing this AM- Personally Reviewed  Physical Exam   GEN: No acute distress.  Sitting in bed Neck: Short and thick neck, difficult assess JVD Cardiac: RRR, no murmurs, rubs, or gallops.  Respiratory: Clear to auscultation bilaterally.  On room air, speak full sentence GI: Abdomen obese, soft, nontender, non-distended ; G-tube in place, not in use currently MS: No bilateral lower extremity edema; No deformity. Neuro: Alert and oriented x3, no cognitive deficit, follow commands appropriately Psych:  Normal affect   Labs    High Sensitivity Troponin:   Recent Labs  Lab 04/14/21 1222 04/14/21 1358 04/14/21 1612  TROPONINIHS 7 192* 3,399*      Chemistry Recent Labs  Lab 04/14/21 1222  NA 133*  K 3.4*  CL 101  CO2 25  GLUCOSE 170*  BUN 9  CREATININE 0.68  CALCIUM 8.9  GFRNONAA >60  ANIONGAP 7     Hematology Recent Labs  Lab 04/14/21 1222  WBC 8.6  RBC 5.30  HGB 15.4  HCT 47.3  MCV 89.2  MCH 29.1  MCHC 32.6  RDW 14.6  PLT 231    BNPNo results for input(s): BNP, PROBNP in the last 168 hours.   DDimer No results for input(s): DDIMER in the last 168 hours.   Radiology    DG Chest 2 View  Result Date: 04/14/2021 CLINICAL DATA:  Right-sided chest pain. EXAM: CHEST - 2 VIEW COMPARISON:  11/08/2019 FINDINGS: The heart size and  mediastinal contours are within normal limits. Both lungs are clear. The visualized skeletal structures are unremarkable. IMPRESSION: No active cardiopulmonary disease. Electronically Signed   By: Danae Orleans M.D.   On: 04/14/2021 13:06   CT Chest W Contrast  Result Date: 04/14/2021 CLINICAL DATA:  Status post Roux-en-Y gastric bypass NG tube placement, chest pain, vomiting, concern for surgical complication EXAM: CT CHEST AND ABDOMEN WITH CONTRAST TECHNIQUE: Multidetector CT imaging of the chest and abdomen was performed following the standard protocol during bolus administration of intravenous contrast. CONTRAST:  37mL OMNIPAQUE IOHEXOL 350 MG/ML SOLN, additional oral enteric contrast COMPARISON:  None. FINDINGS: CT CHEST FINDINGS Cardiovascular: Scattered aortic atherosclerosis. Normal heart size. Three-vessel coronary artery calcifications. No pericardial effusion. Mediastinum/Nodes: No enlarged mediastinal, hilar, or axillary lymph nodes. Thyroid gland, trachea, and esophagus demonstrate no significant findings. Lungs/Pleura: Mild, diffuse bilateral bronchial wall thickening. Background of very fine centrilobular nodules throughout the lungs, most concentrated in the apices. No pleural effusion or pneumothorax. Musculoskeletal: No chest wall mass or suspicious bone lesions identified. CT ABDOMEN FINDINGS Hepatobiliary: No focal liver abnormality is seen. Somewhat coarse, nodular contour of the liver. Status post cholecystectomy. No biliary ductal dilatation. Pancreas: Unremarkable. No pancreatic ductal dilatation or surrounding inflammatory changes. Spleen: Normal in size without focal abnormality. Adrenals/Urinary Tract: Adrenal glands are unremarkable. Kidneys are normal, without renal calculi, focal lesion, or hydronephrosis. Bladder is unremarkable. Stomach/Bowel: Postoperative findings of Roux-en-Y type gastric bypass with additional percutaneous gastrostomy of the gastric remnant. The gastric remnant  is fluid-filled although not distended. Appendix appears normal. No evidence of bowel wall thickening, distention, or inflammatory changes. Vascular/Lymphatic: Aortic atherosclerosis. No enlarged abdominal or pelvic lymph nodes. Other: No abdominal wall hernia or abnormality. Minimal residua of a prior right rectus sheath hematoma (series 2, image 80) no abdominopelvic ascites. Musculoskeletal: No acute or significant osseous findings. IMPRESSION: 1. Postoperative findings of Roux-en-Y type gastric bypass with additional percutaneous gastrostomy of the gastric remnant. The gastric remnant is fluid-filled although not distended. 2. No evidence of bowel obstruction or other surgical complication. 3. Somewhat coarse, nodular contour of the liver, suggestive of cirrhosis. 4. Background of very fine centrilobular nodules throughout the lungs, most concentrated in the apices, most consistent with smoking-related respiratory bronchiolitis. 5. Minimal residua of a prior right rectus sheath hematoma. 6. Coronary artery disease. Aortic Atherosclerosis (ICD10-I70.0). Electronically Signed   By: Lauralyn Primes M.D.   On: 04/14/2021 15:22   CT ABDOMEN W CONTRAST  Result Date: 04/14/2021 CLINICAL DATA:  Status post Roux-en-Y gastric  bypass NG tube placement, chest pain, vomiting, concern for surgical complication EXAM: CT CHEST AND ABDOMEN WITH CONTRAST TECHNIQUE: Multidetector CT imaging of the chest and abdomen was performed following the standard protocol during bolus administration of intravenous contrast. CONTRAST:  72mL OMNIPAQUE IOHEXOL 350 MG/ML SOLN, additional oral enteric contrast COMPARISON:  None. FINDINGS: CT CHEST FINDINGS Cardiovascular: Scattered aortic atherosclerosis. Normal heart size. Three-vessel coronary artery calcifications. No pericardial effusion. Mediastinum/Nodes: No enlarged mediastinal, hilar, or axillary lymph nodes. Thyroid gland, trachea, and esophagus demonstrate no significant findings.  Lungs/Pleura: Mild, diffuse bilateral bronchial wall thickening. Background of very fine centrilobular nodules throughout the lungs, most concentrated in the apices. No pleural effusion or pneumothorax. Musculoskeletal: No chest wall mass or suspicious bone lesions identified. CT ABDOMEN FINDINGS Hepatobiliary: No focal liver abnormality is seen. Somewhat coarse, nodular contour of the liver. Status post cholecystectomy. No biliary ductal dilatation. Pancreas: Unremarkable. No pancreatic ductal dilatation or surrounding inflammatory changes. Spleen: Normal in size without focal abnormality. Adrenals/Urinary Tract: Adrenal glands are unremarkable. Kidneys are normal, without renal calculi, focal lesion, or hydronephrosis. Bladder is unremarkable. Stomach/Bowel: Postoperative findings of Roux-en-Y type gastric bypass with additional percutaneous gastrostomy of the gastric remnant. The gastric remnant is fluid-filled although not distended. Appendix appears normal. No evidence of bowel wall thickening, distention, or inflammatory changes. Vascular/Lymphatic: Aortic atherosclerosis. No enlarged abdominal or pelvic lymph nodes. Other: No abdominal wall hernia or abnormality. Minimal residua of a prior right rectus sheath hematoma (series 2, image 80) no abdominopelvic ascites. Musculoskeletal: No acute or significant osseous findings. IMPRESSION: 1. Postoperative findings of Roux-en-Y type gastric bypass with additional percutaneous gastrostomy of the gastric remnant. The gastric remnant is fluid-filled although not distended. 2. No evidence of bowel obstruction or other surgical complication. 3. Somewhat coarse, nodular contour of the liver, suggestive of cirrhosis. 4. Background of very fine centrilobular nodules throughout the lungs, most concentrated in the apices, most consistent with smoking-related respiratory bronchiolitis. 5. Minimal residua of a prior right rectus sheath hematoma. 6. Coronary artery disease.  Aortic Atherosclerosis (ICD10-I70.0). Electronically Signed   By: Lauralyn Primes M.D.   On: 04/14/2021 15:22    Cardiac Studies   Echocardiogram from 04/01/2019:   1. The left ventricle has normal systolic function, with an ejection  fraction of 55-60%. The cavity size was normal. Left ventricular diastolic  parameters were normal.   2. The right ventricle has normal systolic function. The cavity was  normal. There is no increase in right ventricular wall thickness. Right  ventricular systolic pressure could not be assessed.   3. Left atrial size was mildly dilated.   4. The mitral valve is grossly normal.   5. The tricuspid valve is grossly normal.   6. The aortic valve is grossly normal. No stenosis of the aortic valve.   7. The aorta is abnormal in size and structure.   8. There is mild to moderate dilatation of the aortic root and of the  ascending aorta measuring 42 mm.    Left heart catheterization from 05/08/2015:  The left ventricular systolic function is normal. No significant CAD.   Continue preventive therapy and risk factor modification.    Patient Profile     Hector Curry is a 60 y.o. male with a hx of nonobstructive CAD, hypertension, hyperlipidemia, obesity, type 2 diabetes, OSA on BiPAP, dilated aortic root, paroxysmal atrial tachycardia, formal tobacco use, s/p lap band transitioned to gastric bypass 02/18/2021 with subsequent development of GJ anastomosis stricture and gastrostomy tube placement 03/20/2021, cardiology  is following since 04/14/2021 for non-STEMI.  Assessment & Plan     Non-STEMI Nonobstructive CAD -Presented with chest pain radiating to right arm after having intercourse, lasted about 1 hour from 04/14/21 1030 AM -High sensitive troponin 7 > 192 > 399 -EKG showed sinus rhythm, ST depression V2, old RBBB -Echocardiogram is pending -N.p.o. since midnight, planned for cardiac catheterization today, check BMP today -Medical therapy: Metoprolol  uptitrated to 50 mg twice daily, initiated losartan 12.5 mg daily, continue aspirin 81 mg daily, continue heparin infusion  NSVT -Metoprolol uptitrated, optimize electrolytes, BMP pending today  Hypertension -BP is controlled, diltiazem 60 mg 3 times daily, metoprolol 25 mg twice daily at home -Metoprolol uptitrated to 50 mg twice daily, added losartan 25 mg daily, diltiazem held currently -Continue trend BP  Hyperlipidemia - LDL 101 on 03/06/21, historically on fenofibrate due to transaminitis, LFTs WNL from 03/19/2021, started on lipitor 40 mg daily here  Paroxysmal atrial tachycardia/SVT -Historically on metoprolol and diltiazem for rate control, currently in sinus rhythm  Dilated aortic root -4.2 cm from August 2020 echocardiogram, his echo is pending today, continue outpatient follow-up  Type 2 diabetes Obesity Recent gastric bypass surgery with GJ anastomosis stricture and G-tube placement OSA -Managed per IM  For questions or updates, please contact [facility] Please consult [email or web address] for contact info under     Signed, Xika Zhao, NP  04/15/2021, 9:16 AM    Patient seen, examined. Available data reviewed. Agree with findings, assessment, and plan as outlined by Xika Zhao, NP.  On exam he is alert, oriented, in no distress.  Lungs are clear, heart is regular rate and rhythm with no murmur or gallop, abdomen is soft and nontender.  G-tube is in place without surrounding erythema.  Extremities have no edema.  Telemetry is reviewed and shows normal sinus rhythm with 1 short run of nonsustained VT (7 beats).  All available labs and imaging data is reviewed.  His echocardiogram shows normal LV and RV function with no regional wall motion abnormalities.  He has had a significant non-ST elevation infarction with troponin trend now from 3399-6719.  Cardiac catheterization and PCI are indicated in this patient with multiple cardiovascular risk factors presenting with non-STEMI.  He  is unable to take any oral medications.  All of his medicines are crushed and given in liquid form through his PEG tube.  All of his questions regarding the procedure were answered.  He has had radial heart catheterization in the past and did well with this.  He has a 2+ right radial pulse.  Karlene Southard, M.D. 04/15/2021 11:45 AM  

## 2021-04-15 NOTE — TOC Initial Note (Signed)
Transition of Care [facility]) - Initial/Assessment Note    Patient Details  Name: Hector Curry MRN: [id number] Date of Birth: [birth year]  Transition of Care [facility]) CM/SW Contact:    Gala Lewandowsky, RN Phone Number: 04/15/2021, 2:42 PM  Clinical Narrative: Risk for readmission assessment completed. Prior to arrival the patient was from home with his spouse. The plan will be to return home with home health services. The patient is currently active with [facility] for nursing services. [facility] is aware that the patient is hospitalized and the agency will follow. Spouse states that she was provided a case of tube feedings before leaving [facility]. Spouse states she has half of a case left. Per spouse, she will be ordering tube feedings from [facility] going forward. Case Manager called [facility] to verify tube feedings. Case Manager will continue to follow for additional needs.                Expected Discharge Plan: Home w Home Health Services Barriers to Discharge: Continued Medical Work up   Patient Goals and CMS Choice Patient states their goals for this hospitalization and ongoing recovery are:: to return home   Choice offered to / list presented to : NA (Curently active with [facility] for RN Services.)  Expected Discharge Plan and Services Expected Discharge Plan: Home w Home Health Services   Discharge Planning Services: CM Consult Post Acute Care Choice: Home Health Living arrangements for the past 2 months: Single Family Home                 DME Arranged: Tube feeding DME Agency: [facility] (Case Manager called [facility] to see if tube feedings can be ordered.) Date DME Agency Contacted: 04/15/21 Time DME Agency Contacted: 1430 Representative spoke with at DME Agency: Ian Malkin HH Arranged: RN HH Agency: [facility] ([facility]) Date HH Agency Contacted: 04/15/21 Time HH Agency Contacted: 1420 Representative spoke with at [facility] Agency:  Pearson Grippe  Prior Living Arrangements/Services Living arrangements for the past 2 months: Single Family Home Lives with:: Spouse Patient language and need for interpreter reviewed:: Yes Do you feel safe going back to the place where you live?: Yes      Need for Family Participation in Patient Care: Yes (Comment)     Criminal Activity/Legal Involvement Pertinent to Current Situation/Hospitalization: No - Comment as needed  Activities of Daily Living Home Assistive Devices/Equipment: CPAP, CBG Meter, Blood pressure cuff ADL Screening (condition at time of admission) Patient's cognitive ability adequate to safely complete daily activities?: No Is the patient deaf or have difficulty hearing?: No Does the patient have difficulty seeing, even when wearing glasses/contacts?: No Does the patient have difficulty concentrating, remembering, or making decisions?: No Patient able to express need for assistance with ADLs?: Yes Does the patient have difficulty dressing or bathing?: No Independently performs ADLs?: Yes (appropriate for developmental age) Does the patient have difficulty walking or climbing stairs?: No Weakness of Legs: None Weakness of Arms/Hands: None  Permission Sought/Granted Permission sought to share information with : Family Supports, Magazine features editor, Case Estate manager/land agent granted to share information with : Yes, Verbal Permission Granted     Permission granted to share info w AGENCY: [facility], [facility]        Emotional Assessment Appearance:: Appears stated age     Orientation: : Oriented to Place, Oriented to  Time, Oriented to Self, Oriented to Situation Alcohol / Substance Use: Not Applicable Psych Involvement: No (comment)  Admission  diagnosis:  NSTEMI (non-ST elevated myocardial infarction) [facility]) [I21.4] Patient Active Problem List   Diagnosis Date Noted   NSTEMI (non-ST elevated myocardial infarction) (HCC) 04/14/2021   Gastrostomy  tube obstruction (HCC) 03/26/2021   Malnutrition of moderate degree 03/22/2021   Dehydration 03/17/2021   Gastric outlet obstruction 03/09/2021   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   Morbid obesity (HCC) 02/18/2021   History of kidney stones 05/30/2016   Elevated LFTs 02/13/2016   Morbid obesity with BMI of 40.0-44.9, adult (HCC) 12/20/2015   Atrial tachycardia (HCC) 06/21/2015   Hypertriglyceridemia 05/15/2015   Coronary artery disease, non-occlusive    Type II diabetes mellitus, uncontrolled (HCC) 05/08/2015   Chest pain at rest    Hyperglycemia 05/07/2015   Chest pain 05/06/2015   GERD (gastroesophageal reflux disease)    Kidney stones    Hemorrhoids    Inflammatory arthritis    Family history of premature CAD    OSA (obstructive sleep apnea)    History of tobacco abuse    Palpitations    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/20/2014   Diverticulosis 10/03/2014   Hyperplastic colon polyp 10/03/2014   HTN, goal below 140/90 08/07/2014   Pain in the chest 06/19/2014   SOB (shortness of breath) 06/19/2014   Obesity 06/19/2014   Hyperlipidemia 06/19/2014   PCP:  Marguarite Arbour, MD Pharmacy:   [facility] [address] [place] [address] Phone: [phone] Fax: [phone]     Social Determinants of Health (SDOH) Interventions    Readmission Risk Interventions Readmission Risk Prevention Plan 04/15/2021  Transportation Screening Complete  PCP or Specialist Appt within 3-5 Days Complete  Social Work Consult for Recovery Care Planning/Counseling Complete  Palliative Care Screening Not Applicable  Medication Review Oceanographer) Complete  Some recent data might be hidden

## 2021-04-16 ENCOUNTER — Other Ambulatory Visit (HOSPITAL_COMMUNITY): Payer: Self-pay

## 2021-04-16 ENCOUNTER — Encounter (HOSPITAL_COMMUNITY): Payer: Self-pay | Admitting: Cardiovascular Disease

## 2021-04-16 LAB — BASIC METABOLIC PANEL
Anion gap: 7 (ref 5–15)
BUN: 5 mg/dL — ABNORMAL LOW (ref 6–20)
CO2: 25 mmol/L (ref 22–32)
Calcium: 8.7 mg/dL — ABNORMAL LOW (ref 8.9–10.3)
Chloride: 105 mmol/L (ref 98–111)
Creatinine, Ser: 0.55 mg/dL — ABNORMAL LOW (ref 0.61–1.24)
GFR, Estimated: >60 mL/min (ref >60)
Glucose, Bld: 115 mg/dL — ABNORMAL HIGH (ref 70–99)
Potassium: 3.4 mmol/L — ABNORMAL LOW (ref 3.5–5.1)
Sodium: 137 mmol/L (ref 135–145)

## 2021-04-16 LAB — CBC
HCT: 40.4 % (ref 39.0–52.0)
Hemoglobin: 13.1 g/dL (ref 13.0–17.0)
MCH: 28.7 pg (ref 26.0–34.0)
MCHC: 32.4 g/dL (ref 30.0–36.0)
MCV: 88.6 fL (ref 80.0–100.0)
Platelets: 175 10*3/uL (ref 150–400)
RBC: 4.56 MIL/uL (ref 4.22–5.81)
RDW: 14.6 % (ref 11.5–15.5)
WBC: 6.6 10*3/uL (ref 4.0–10.5)
nRBC: 0 % (ref 0.0–0.2)

## 2021-04-16 LAB — GLUCOSE, CAPILLARY: Glucose-Capillary: 131 mg/dL — ABNORMAL HIGH (ref 70–99)

## 2021-04-16 MED ORDER — TICAGRELOR 90 MG PO TABS
90.0000 mg | ORAL_TABLET | Freq: Two times a day (BID) | ORAL | 2 refills | Status: DC
  Filled 2021-04-16: qty 60, 30d supply, fill #0
  Filled 2021-05-11: qty 60, 30d supply, fill #1
  Filled 2021-06-10 (×2): qty 60, 30d supply, fill #2

## 2021-04-16 MED ORDER — DILTIAZEM 12 MG/ML ORAL SUSPENSION
60.0000 mg | Freq: Three times a day (TID) | ORAL | Status: DC
  Filled 2021-04-16 (×2): qty 6

## 2021-04-16 MED ORDER — ATORVASTATIN CALCIUM 40 MG PO TABS: 40.0000 mg | ORAL_TABLET | Freq: Every day | ORAL | 2 refills | Status: DC

## 2021-04-16 MED ORDER — ASPIRIN 81 MG PO CHEW: 81.0000 mg | CHEWABLE_TABLET | Freq: Every day | ORAL | 0 refills | Status: AC

## 2021-04-16 MED ORDER — LOSARTAN POTASSIUM 25 MG PO TABS: 25.0000 mg | ORAL_TABLET | Freq: Every day | ORAL | 0 refills | Status: DC

## 2021-04-16 MED ORDER — TICAGRELOR 90 MG PO TABS: 90.0000 mg | ORAL_TABLET | Freq: Two times a day (BID) | ORAL | Status: DC

## 2021-04-16 MED ORDER — POTASSIUM CHLORIDE 20 MEQ PO PACK
40.0000 meq | PACK | Freq: Two times a day (BID) | ORAL | Status: DC
Start: 2021-04-16 — End: 2021-04-16
  Administered 2021-04-16: 40 meq
  Filled 2021-04-16: qty 2

## 2021-04-16 MED ORDER — ATORVASTATIN CALCIUM 40 MG PO TABS: 40.0000 mg | ORAL_TABLET | Freq: Every day | ORAL | Status: DC

## 2021-04-16 MED ORDER — ASPIRIN 81 MG PO CHEW: 81.0000 mg | CHEWABLE_TABLET | Freq: Every day | ORAL | Status: DC

## 2021-04-16 NOTE — Telephone Encounter (Signed)
Patients wife is returning your call please call her back at [phone]

## 2021-04-16 NOTE — Progress Notes (Signed)
CARDIAC REHAB PHASE I   PRE:  Rate/Rhythm: 66 SR    BP: sitting 122/82    SaO2: 97 RA  MODE:  Ambulation: 470 ft   POST:  Rate/Rhythm: 105 ST    BP: sitting 140/96     SaO2: 96 RA  Pt ambulated at quick pace, no CP or SOB. No c/o. BP elevated after walk.   In depth discussion of MI, stent, restrictions, Brilinta importance, diet (for in the future), exercise guidelines, NTG, and CRPII. Pt and wife very receptive with many questions. Will refer to [facility] CRPII although pt is still considering (works at [employer]).  [phone] [phone]  Harriet Masson CES, ACSM 04/16/2021 9:20 AM

## 2021-04-16 NOTE — Care Management (Signed)
Eligibility sent for Brilinta

## 2021-04-16 NOTE — Consult Note (Signed)
   [facility] Inpatient Consult   04/16/2021  ROYE GUSTAFSON 17-May-1961 [id number]   [place] [facility] [[facility]] Patient: Hector Curry Health plan  Patient is currently assigned to a [place] Management for chronic disease management services.    Patient will receive a post hospital call and will be evaluated for assessments and disease process education.  Met with the patient and his wife at the bedside regarding [facility] RN CM role in post hospital follow up for support and care management.  Wife states she believes she has received a packet this week. Gave another brochure and 24 hour nurse advise line magnet.  Home and cell phone verified.    Plan: Patient will be followed by [place] Coordinator.   For additional questions or referrals please contact:   Natividad Brood, RN BSN [facility] Liaison  [phone] business mobile phone Toll free office [phone]  Fax number: [phone] [email or web address]

## 2021-04-16 NOTE — TOC Benefit Eligibility Note (Signed)
Patient Product/process development scientist completed.    The patient is currently admitted and upon discharge could be taking Brilinta 90 mg.  The current 30 day co-pay is, $25.00 .[employer], the mfr of BRILINTA 90 MG TABLET paid 54.35 toward plan copay   The patient is insured through [employer]     Roland Earl, CPhT Pharmacy Patient Advocate Specialist [place] [facility] Stewardship Team Direct Number: [phone]  Fax: [phone]

## 2021-04-16 NOTE — TOC Benefit Eligibility Note (Signed)
Transition of Care [facility]) Benefit Eligibility Note    Patient Details  Name: Hector Curry MRN: [id number] Date of Birth: 07/07/1961   Medication/Dose: BRILINTA   90 MG BID  Covered?: Yes  Tier:  (PREFERRED)  Prescription Coverage Preferred Pharmacy: [facility]  Spoke with Person/Company/Phone Number:: JEAN @  [employer] # [phone]  Co-Pay: $ 102.97  Prior Approval: No  Deductible:  (NO DEDUCTIBLE WITH PLAN / OUT-OF-POCKET:UNMET)       Mardene Sayer Phone Number: 04/16/2021, 1:04 PM

## 2021-04-16 NOTE — TOC Transition Note (Addendum)
Transition of Care [facility]) - CM/SW Discharge Note   Patient Details  Name: Hector Curry MRN: [id number] Date of Birth: 1960-11-13  Transition [facility]) CM/SW Contact:  Lockie Pares, RN Phone Number: 04/16/2021, 1:17 PM   Clinical Narrative:     Patient on TF at home due to bariatric surgery. Now will be on brillinta, this was sent for eligibility. 50.00 c-payCalled patient so he is aware of co-pays- went through [facility] pharmacy Brilinta card utilized, they state his co-pays will be 25.00 Kenzie at [facility] aware of DC, orders for resumption of Home Health services placed in orders.    Final next level of care: Home/Self Care Barriers to Discharge: Continued Medical Work up   Patient Goals and CMS Choice Patient states their goals for this hospitalization and ongoing recovery are:: to return home   Choice offered to / list presented to : NA (Curently active with [facility] for RN Services.)  Discharge Placement             Home Home Health          Discharge Plan and Services   Discharge Planning Services: CM Consult Post Acute Care Choice: Home Health          DME Arranged: Tube feeding DME Agency: [employer] (Case Manager called [employer] to see if tube feedings can be ordered.) Date DME Agency Contacted: 04/15/21 Time DME Agency Contacted: 1430 Representative spoke with at DME Agency: Ian Malkin HH Arranged: RN HH Agency: [facility] Date HH Agency Contacted: 04/15/21 Time HH Agency Contacted: 1420 Representative spoke with at [facility] Agency: Pearson Grippe  Social Determinants of Health (SDOH) Interventions     Readmission Risk Interventions Readmission Risk Prevention Plan 04/15/2021  Transportation Screening Complete  PCP or Specialist Appt within 3-5 Days Complete  Social Work Consult for Recovery Care Planning/Counseling Complete  Palliative Care Screening Not Applicable  Medication Review Oceanographer) Complete  Some recent data  might be hidden

## 2021-04-16 NOTE — Discharge Summary (Signed)
Physician Discharge Summary  Hector Curry BPZ:[id number] DOB: [birth year] DOA: 04/14/2021  PCP: [name], MD  Admit date: 04/14/2021 Discharge date: 04/16/2021  Admitted From: Home Disposition: Home  Recommendations for Outpatient Follow-up:  Follow up with PCP in 1-2 weeks Please obtain BMP/CBC in one week 3.  Follow-up with cardiac rehab as discussed and referred.  Home Health: Not applicable Equipment/Devices: Not applicable  Discharge Condition: Stable CODE STATUS: Full code Diet recommendation: NPO.  Feeding through the G-tube.  Discharge summary:  60 y.o. male who is a former smoker with past medical history relevant for obesity, OSA, paroxysmal atrial fibrillation, HTN, who underwent difficult laparoscopic conversion of laparoscopic adjustable gastric band to gastric bypass on 02/18/2021.  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 This was unable to be traversed with an endoscope, so he underwent laparoscopic remnant gastrostomy tube placement on 03/20/2021.   Patient presented to the emergency room with postcoital chest pain.  No fever or chills.  No nausea vomiting or diarrhea.  In the emergency room hemodynamically stable.  Twelve-lead EKG showed sinus rhythm with right bundle branch block and left posterior fascicular block.  Troponins [phone].  Discussed with cardiology, started on heparin infusion and admitted to the cardiology unit.  NSTEMI Cardiac cath 04/15/21 with severe proximal LAD stenosis treated with a drug eluting stent. Also with a severe lesion in the Diagonal branch which is the likely culprit lesion but this vessel is too small for PCI. Continue ASA and Brilinta for one year. Continue beta blocker and statin.   Obesity and obstructive sleep apnea: CPAP nightly.  Stable   Recent complicated surgery, gastric band to gastric bypass and stricture near the Hepburn anastomosis.  Currently with PEG tube feeding.  Tolerating. All medications were  changed to oral formulations with liquids so that they can be used through his PEG tube.  He can only take any medicine that can be dissolved in the mouth.  Type 2 diabetes: Known A1c 7.3.  Uncontrolled with hyperglycemia.  Go back on home doses of insulin.  Hypertension: Stable now.  Started on losartan, metoprolol and diltiazem.  Paroxysmal atrial fibrillation: Currently in sinus rhythm.  On metoprolol and diltiazem.  Hypokalemia/hypomagnesemia: Replaced and adequate.  Stable for discharge with cardiology follow-up plans.  Discharge Diagnoses:  Principal Problem:   NSTEMI (non-ST elevated myocardial infarction) ([place]) Active Problems:   Obesity   Hyperlipidemia   Family history of premature CAD   Chest pain   Type II diabetes mellitus, uncontrolled ([place])   History of Roux-en-Y gastric bypass    Discharge Instructions  Discharge Instructions     Amb Referral to Cardiac Rehabilitation   Complete by: As directed    Diagnosis:  Coronary Stents NSTEMI PTCA     After initial evaluation and assessments completed: Virtual Based Care may be provided alone or in conjunction with Phase 2 Cardiac Rehab based on patient barriers.: Yes   Call MD for:  difficulty breathing, headache or visual disturbances   Complete by: As directed    Call MD for:  persistant nausea and vomiting   Complete by: As directed    Increase activity slowly   Complete by: As directed       Allergies as of 04/16/2021       Reactions   Other Other (See Comments)   Cats        Medication List     STOP taking these medications    cetirizine HCl 1 MG/ML solution Commonly known  as: Cetirizine HCl Childrens Alrgy   cetirizine HCl 5 MG/5ML Soln Commonly known as: Zyrtec   docusate 50 MG/5ML liquid Commonly known as: COLACE   SYSTANE BALANCE OP   zolpidem 5 MG tablet Commonly known as: AMBIEN       TAKE these medications    acetaminophen 160 MG/5ML solution Commonly known as:  TYLENOL Place 20.3 mLs (650 mg total) into feeding tube every 6 (six) hours as needed for mild pain, headache or fever.   aspirin 81 MG chewable tablet Place 1 tablet (81 mg total) into feeding tube daily. Start taking on: April 17, 2021   atorvastatin 40 MG tablet Commonly known as: LIPITOR Place 1 tablet (40 mg total) into feeding tube daily at 6 PM.   BARIATRIC MULTIVITAMINS/IRON PO Place 1 tablet into feeding tube daily. With Vit B complex   diltiazem 10 mg/ml Soln oral suspension Commonly known as: CARDIZEM Place 6 mLs (60 mg total) into feeding tube every 8 (eight) hours.   feeding supplement (OSMOLITE 1.2 CAL) Liqd Place 1,000 mLs into feeding tube continuous. -Begin with 50 ml Osmolite 1.2 via G-tube every 2 hours (8 feedings total while awake) -Free water flush with 30 ml before and after each bolus As tolerated work up to 100 ml Osmolite 1.2 7 times daily (~every 2 hours). -Continue Prosource TF BID. -3 cartons total of Osmolite 1.2 + Prosource TF BID provides 935 kcals (66% of needs), 61g protein (77% of needs), 112g CHO and 1005 ml H2O. -Free water of 240 ml TID via G-tube or PO -Will meet rest of needs with other liquids/diet and protein supplements   feeding supplement (PROSource TF) liquid Place 45 mLs into feeding tube 2 (two) times daily. -Begin with 50 ml Osmolite 1.2 via G-tube every 2 hours (8 feedings total while awake) -Free water flush with 30 ml before and after each bolus As tolerated work up to 100 ml Osmolite 1.2 7 times daily (~every 2 hours). -Continue Prosource TF BID. -3 cartons total of Osmolite 1.2 + Prosource TF BID provides 935 kcals (66% of needs), 61g protein (77% of needs), 112g CHO and 1005 ml H2O. -Free water of 240 ml TID via G-tube or PO -Will meet rest of needs with other liquids/diet and protein supplements   feeding supplement (OSMOLITE 1.2 CAL) Liqd Place 50 mLs into feeding tube every 2 (two) hours.   free water Soln Place 60 mLs  into feeding tube every 8 (eight) hours.   FreeStyle Libre 2 Sensor Misc Use as directed every 14 days   HumaLOG KwikPen 100 UNIT/ML KwikPen Generic drug: insulin lispro Inject 0-20 Units into the skin 3 (three) times daily with meals. (according to sliding scale)   hydrochlorothiazide 10 mg/mL Susp Place 0.63 mLs (6.25 mg total) into feeding tube daily.   losartan 25 MG tablet Commonly known as: COZAAR 1 tablet (25 mg total) by Per J Tube route daily. Start taking on: April 17, 2021   Melatonin 5 MG Subl Place 5 mg under the tongue at bedtime.   metoprolol tartrate 25 mg/10 mL Susp Commonly known as: LOPRESSOR Place 10 mLs (25 mg total) into feeding tube 2 (two) times daily. What changed: Another medication with the same name was removed. Continue taking this medication, and follow the directions you see here.   multivitamin Liqd Place 15 mLs into feeding tube daily.   Omeprazole Magnesium 10 MG Pack Take 20 mg by mouth daily. What changed: additional instructions   ondansetron 4 MG  disintegrating tablet Commonly known as: ZOFRAN-ODT Dissolve 1 tablet (4 mg total) by mouth every 6 (six) hours as needed for nausea.   Oscimin 0.125 MG Subl Generic drug: Hyoscyamine Sulfate SL Place 1 tablet (0.125 mg) under the tongue every 6 (six) hours as needed for cramping   potassium chloride 20 MEQ/15ML (10%) Soln Dilute 15 mls of solution (20 mEq total) as directed and place into feeding tube 2 (two) times daily.   protein supplement 6 g Powd Commonly known as: RESOURCE BENEPROTEIN Place 1 Scoop (6 g total) into feeding tube every 3 (three) hours while awake.   ticagrelor 90 MG Tabs tablet Commonly known as: BRILINTA Place 1 tablet (90 mg total) into feeding tube 2 (two) times daily.   Unifine Pentips 32G X 4 MM Misc Generic drug: Insulin Pen Needle Use as needed as directed        Follow-up Information     [place] Follow up.   Why: Registered  Nurse-office to call with visit times.               Allergies  Allergen Reactions   Other Other (See Comments)    Cats    Consultations: Cardiology   Procedures/Studies: DG Chest 2 View  Result Date: 04/14/2021 CLINICAL DATA:  Right-sided chest pain. EXAM: CHEST - 2 VIEW COMPARISON:  11/08/2019 FINDINGS: The heart size and mediastinal contours are within normal limits. Both lungs are clear. The visualized skeletal structures are unremarkable. IMPRESSION: No active cardiopulmonary disease. Electronically Signed   By: Marlaine Hind M.D.   On: 04/14/2021 13:06   DG Abd 1 View  Result Date: 03/26/2021 CLINICAL DATA:  Abdominal pain and nausea with blocked peg tube. EXAM: ABDOMEN - 1 VIEW COMPARISON:  March 09, 2021 FINDINGS: The bowel gas pattern is normal. A percutaneous gastrostomy tube is seen with its distal tip and insufflator bulb overlying the expected region of the body of the stomach. Radiopaque contrast is noted throughout the lumen of the PEG tube. Ill-defined surgical sutures are seen overlying the mid right abdomen with radiopaque surgical clips overlying the right upper quadrant. No radio-opaque calculi or other significant radiographic abnormality are seen. IMPRESSION: Percutaneous gastrostomy tube, as described above, without evidence of bowel obstruction. Electronically Signed   By: Virgina Norfolk M.D.   On: 03/26/2021 20:23   CT Chest W Contrast  Result Date: 04/14/2021 CLINICAL DATA:  Status post Roux-en-Y gastric bypass NG tube placement, chest pain, vomiting, concern for surgical complication EXAM: CT CHEST AND ABDOMEN WITH CONTRAST TECHNIQUE: Multidetector CT imaging of the chest and abdomen was performed following the standard protocol during bolus administration of intravenous contrast. CONTRAST:  4m OMNIPAQUE IOHEXOL 350 MG/ML SOLN, additional oral enteric contrast COMPARISON:  None. FINDINGS: CT CHEST FINDINGS Cardiovascular: Scattered aortic atherosclerosis.  Normal heart size. Three-vessel coronary artery calcifications. No pericardial effusion. Mediastinum/Nodes: No enlarged mediastinal, hilar, or axillary lymph nodes. Thyroid gland, trachea, and esophagus demonstrate no significant findings. Lungs/Pleura: Mild, diffuse bilateral bronchial wall thickening. Background of very fine centrilobular nodules throughout the lungs, most concentrated in the apices. No pleural effusion or pneumothorax. Musculoskeletal: No chest wall mass or suspicious bone lesions identified. CT ABDOMEN FINDINGS Hepatobiliary: No focal liver abnormality is seen. Somewhat coarse, nodular contour of the liver. Status post cholecystectomy. No biliary ductal dilatation. Pancreas: Unremarkable. No pancreatic ductal dilatation or surrounding inflammatory changes. Spleen: Normal in size without focal abnormality. Adrenals/Urinary Tract: Adrenal glands are unremarkable. Kidneys are normal, without renal calculi, focal lesion, or  hydronephrosis. Bladder is unremarkable. Stomach/Bowel: Postoperative findings of Roux-en-Y type gastric bypass with additional percutaneous gastrostomy of the gastric remnant. The gastric remnant is fluid-filled although not distended. Appendix appears normal. No evidence of bowel wall thickening, distention, or inflammatory changes. Vascular/Lymphatic: Aortic atherosclerosis. No enlarged abdominal or pelvic lymph nodes. Other: No abdominal wall hernia or abnormality. Minimal residua of a prior right rectus sheath hematoma (series 2, image 80) no abdominopelvic ascites. Musculoskeletal: No acute or significant osseous findings. IMPRESSION: 1. Postoperative findings of Roux-en-Y type gastric bypass with additional percutaneous gastrostomy of the gastric remnant. The gastric remnant is fluid-filled although not distended. 2. No evidence of bowel obstruction or other surgical complication. 3. Somewhat coarse, nodular contour of the liver, suggestive of cirrhosis. 4. Background of  very fine centrilobular nodules throughout the lungs, most concentrated in the apices, most consistent with smoking-related respiratory bronchiolitis. 5. Minimal residua of a prior right rectus sheath hematoma. 6. Coronary artery disease. Aortic Atherosclerosis (ICD10-I70.0). Electronically Signed   By: Eddie Candle M.D.   On: 04/14/2021 15:22   CT ABDOMEN W CONTRAST  Result Date: 04/14/2021 CLINICAL DATA:  Status post Roux-en-Y gastric bypass NG tube placement, chest pain, vomiting, concern for surgical complication EXAM: CT CHEST AND ABDOMEN WITH CONTRAST TECHNIQUE: Multidetector CT imaging of the chest and abdomen was performed following the standard protocol during bolus administration of intravenous contrast. CONTRAST:  68m OMNIPAQUE IOHEXOL 350 MG/ML SOLN, additional oral enteric contrast COMPARISON:  None. FINDINGS: CT CHEST FINDINGS Cardiovascular: Scattered aortic atherosclerosis. Normal heart size. Three-vessel coronary artery calcifications. No pericardial effusion. Mediastinum/Nodes: No enlarged mediastinal, hilar, or axillary lymph nodes. Thyroid gland, trachea, and esophagus demonstrate no significant findings. Lungs/Pleura: Mild, diffuse bilateral bronchial wall thickening. Background of very fine centrilobular nodules throughout the lungs, most concentrated in the apices. No pleural effusion or pneumothorax. Musculoskeletal: No chest wall mass or suspicious bone lesions identified. CT ABDOMEN FINDINGS Hepatobiliary: No focal liver abnormality is seen. Somewhat coarse, nodular contour of the liver. Status post cholecystectomy. No biliary ductal dilatation. Pancreas: Unremarkable. No pancreatic ductal dilatation or surrounding inflammatory changes. Spleen: Normal in size without focal abnormality. Adrenals/Urinary Tract: Adrenal glands are unremarkable. Kidneys are normal, without renal calculi, focal lesion, or hydronephrosis. Bladder is unremarkable. Stomach/Bowel: Postoperative findings of  Roux-en-Y type gastric bypass with additional percutaneous gastrostomy of the gastric remnant. The gastric remnant is fluid-filled although not distended. Appendix appears normal. No evidence of bowel wall thickening, distention, or inflammatory changes. Vascular/Lymphatic: Aortic atherosclerosis. No enlarged abdominal or pelvic lymph nodes. Other: No abdominal wall hernia or abnormality. Minimal residua of a prior right rectus sheath hematoma (series 2, image 80) no abdominopelvic ascites. Musculoskeletal: No acute or significant osseous findings. IMPRESSION: 1. Postoperative findings of Roux-en-Y type gastric bypass with additional percutaneous gastrostomy of the gastric remnant. The gastric remnant is fluid-filled although not distended. 2. No evidence of bowel obstruction or other surgical complication. 3. Somewhat coarse, nodular contour of the liver, suggestive of cirrhosis. 4. Background of very fine centrilobular nodules throughout the lungs, most concentrated in the apices, most consistent with smoking-related respiratory bronchiolitis. 5. Minimal residua of a prior right rectus sheath hematoma. 6. Coronary artery disease. Aortic Atherosclerosis (ICD10-I70.0). Electronically Signed   By: AEddie CandleM.D.   On: 04/14/2021 15:22   CARDIAC CATHETERIZATION  Result Date: 04/15/2021 Images from the original result were not included.   Prox LAD lesion is 80% stenosed.   1st Diag lesion is 90% stenosed.   A drug-eluting stent was successfully placed.  Post intervention, there is a 0% residual stenosis. Hector Curry is a 60 y.o. male  [id number] LOCATION:  FACILITY: [facility] PHYSICIAN: Quay Burow, M.D. Jun 13, 1961 DATE OF PROCEDURE:  04/15/2021 DATE OF DISCHARGE: CARDIAC CATHETERIZATION / PCI -DES LAD History obtained from chart review.Hector Curry is a 60 y.o. male with a hx of nonobstructive CAD, hypertension, hyperlipidemia, obesity, type 2 diabetes, OSA on BiPAP, dilated aortic root, paroxysmal  atrial tachycardia, formal tobacco use, s/p lap band transitioned to gastric bypass 02/18/2021 with subsequent development of GJ anastomosis stricture and gastrostomy tube placement 03/20/2021, cardiology is following since 04/14/2021 for non-STEMI. PROCEDURE DESCRIPTION: The patient was brought to the second floor [facility]iac cath lab in the postabsorptive state. He was premedicated with IV Versed and fentanyl. His right wristwas prepped and shaved in usual sterile fashion. Xylocaine 1% was used for local anesthesia. A 6 French sheath was inserted into the right radial  artery using standard Seldinger technique. The patient received 6000 units  of heparin intravenously.  5 [place] TIG catheter, left Judkins catheter were used for selective coronary angiography and obtain left heart pressures.  Isovue dye was used for the entirety of the case (120 cc total administered to patient).  Retrograde aortic, left ventricular and pullback pressures were recorded.  Radial cocktail was administered via the SideArm sheath. Because the patient was n.p.o. he was begun on cangrelor bolus followed by infusion for antiplatelet therapy.  He received a total of 15,000 units of heparin with an ACT of 277.  Isovue dye was used for the entirety of the intervention.  Retrograde aortic pressures monitored in the case. Using a 6 [place] XB 3.0 LAD guide cath along with 0.14 [place] guidewire I predilated the proximal LAD with a 2 mm x 12 mm balloon.  Following this I carefully positioned a 3 mm x 18 mm long Medtronic frontier drug-eluting stent and deployed at 14 atm.  I postdilated with a 3.25 mm x 15 mm long noncompliant balloon at 16 atm (3.35 mm) resulting in reduction of a an 80% fairly focal stenosis just before the first diagonal branch to 0% residual.  There was only mild encroachment of the diagonal branch ostium.  The patient tolerated procedure well.  He received crushed Brilinta down his PEG tube.  Cangrelor will be  discontinued in 3 hours.    Patient was admitted with a non-STEMI.  The culprit lesion was a diagonal branch.  He did have an 80% proximal LAD lesion which underwent PCI and drug-eluting stenting successfully without compromise of the diagonal branch ostium.  His LV function was normal by visual inspection of his 2D echocardiogram and his LVEDP was 14.  He will need to be on dual antiplatelet therapy uninterrupted for 12 months.  This may be an issue with his PEJ tube.  The sheath was removed and a TR band was placed on the right wrist to achieve patent hemostasis.  The patient left the lab in stable condition.  Dr. Burt Knack is aware of the result. Quay Burow. MD, [facility] 04/15/2021 3:48 PM    IR GJ Tube Change  Result Date: 03/27/2021 INDICATION: 60 year old male male with clogged gastrostomy tube. EXAM: FLUOROSCOPIC GUIDED REPLACEMENT OF GASTROSTOMY TUBE COMPARISON:  KUB, 03/26/2021. MEDICATIONS: None. CONTRAST:  71m OMNIPAQUE IOHEXOL 300 MG/ML SOLN - administered into the gastric lumen FLUOROSCOPY TIME:  0 minutes, 36 seconds.  Dose: 12 mGy. COMPLICATIONS: None immediate. PROCEDURE: Informed written consent was obtained from the patient after a discussion of the  risks, benefits and alternatives to treatment. Questions regarding the procedure were encouraged and answered. A timeout was performed prior to the initiation of the procedure. The upper abdomen and external portion of the existing gastrostomy tube was prepped and draped in the usual sterile fashion, and a sterile drape was applied covering the operative field. Maximum barrier sterile technique with sterile gowns and gloves were used for the procedure. A timeout was performed prior to the initiation of the procedure. The existing gastrostomy tube was clogged and therefore contrast injection confirming appropriate positioning within the gastric lumen could not be performed. Next, the gastrostomy tube was cannulated with a stiff Glidewire which was  coiled within the gastric fundus. The retention balloon was deflated. Over the stiff guide wire, the existing gastrostomy tube was exchanged for a new 18-French balloon inflatable gastrostomy tube. The balloon was inflated with saline and dilute contrast and pulled against the anterior inner lumen of the stomach and the external disc was cinched. Contrast was injected and a post procedural spot fluoroscopic image was obtained confirming appropriate positioning and functionality of the new gastrostomy tube. A dressing was applied. The patient tolerated the procedure well without immediate postprocedural complication. IMPRESSION: Successful fluoroscopic guided replacement of a new 18-French gastrostomy tube. The gastrostomy tube is ready for immediate use. Michaelle Birks, MD [facility] Rad[facility] Electronically Signed   By: Michaelle Birks MD   On: 03/27/2021 16:22   ECHOCARDIOGRAM COMPLETE  Result Date: 04/15/2021    ECHOCARDIOGRAM REPORT   Patient Name:   Hector Curry Date of Exam: 04/15/2021 Medical Rec #:  [id number]         Height:       74.0 in Accession #:    [id number]        Weight:       265.0 lb Date of Birth:  October 25, 1960        BSA:          2.450 m Patient Age:    71 years          BP:           115/69 mmHg Patient Gender: M                 HR:           80 bpm. Exam Location:  Inpatient Procedure: 2D Echo, Cardiac Doppler, Color Doppler and Intracardiac            Opacification Agent Indications:     122-I22.9 Subsequent ST elevation (STEM) and non-ST elevation                  (NSTEMI) myocardial infarction  History:         Patient has prior history of Echocardiogram examinations, most                  recent 04/01/2019. CAD, Abnormal ECG, Arrythmias:Tachycardia,                  Signs/Symptoms:Chest Pain and Dyspnea; Risk                  Factors:Dyslipidemia and Former Smoker.  Sonographer:     Roseanna Rainbow RDCS Referring Phys:  [id number] [facility]  Diagnosing Phys: Oswaldo Milian MD  Sonographer Comments: Technically difficult study due to poor echo windows and patient is morbidly obese. Image acquisition challenging due to patient body habitus. IMPRESSIONS  1. Left ventricular ejection fraction, by estimation, is  50 to 55%. The left ventricle has low normal function. The left ventricle demonstrates regional wall motion abnormalities. Apical hypokinesis. There is moderate asymmetric left ventricular hypertrophy of the basal-septal segment. Left ventricular diastolic parameters are indeterminate.  2. Right ventricular systolic function is normal. The right ventricular size is mildly enlarged. Tricuspid regurgitation signal is inadequate for assessing PA pressure.  3. The mitral valve is normal in structure. No evidence of mitral valve regurgitation. No evidence of mitral stenosis.  4. The aortic valve is tricuspid. Aortic valve regurgitation is not visualized. Mild aortic valve sclerosis is present, with no evidence of aortic valve stenosis.  5. Aortic dilatation noted. There is dilatation of the ascending aorta, measuring 44 mm. FINDINGS  Left Ventricle: Left ventricular ejection fraction, by estimation, is 50 to 55%. The left ventricle has low normal function. The left ventricle demonstrates regional wall motion abnormalities. Definity contrast agent was given IV to delineate the left ventricular endocardial borders. The left ventricular internal cavity size was normal in size. There is moderate asymmetric left ventricular hypertrophy of the basal-septal segment. Left ventricular diastolic parameters are indeterminate. Right Ventricle: The right ventricular size is mildly enlarged. Right vetricular wall thickness was not well visualized. Right ventricular systolic function is normal. Tricuspid regurgitation signal is inadequate for assessing PA pressure. Left Atrium: Left atrial size was normal in size. Right Atrium: Right atrial size was normal in  size. Pericardium: There is no evidence of pericardial effusion. Mitral Valve: The mitral valve is normal in structure. No evidence of mitral valve regurgitation. No evidence of mitral valve stenosis. Tricuspid Valve: The tricuspid valve is normal in structure. Tricuspid valve regurgitation is trivial. Aortic Valve: The aortic valve is tricuspid. Aortic valve regurgitation is not visualized. Mild aortic valve sclerosis is present, with no evidence of aortic valve stenosis. Pulmonic Valve: The pulmonic valve was not well visualized. Pulmonic valve regurgitation is not visualized. Aorta: The aortic root is normal in size and structure and aortic dilatation noted. There is dilatation of the ascending aorta, measuring 44 mm. IAS/Shunts: The interatrial septum was not well visualized.  LEFT VENTRICLE PLAX 2D LVIDd:         5.20 cm      Diastology LVIDs:         3.50 cm      LV e' medial:    4.35 cm/s LV PW:         1.10 cm      LV E/e' medial:  15.9 LV IVS:        1.40 cm      LV e' lateral:   5.44 cm/s LVOT diam:     2.30 cm      LV E/e' lateral: 12.7 LV SV:         74 LV SV Index:   30 LVOT Area:     4.15 cm  LV Volumes (MOD) LV vol d, MOD A2C: 110.0 ml LV vol d, MOD A4C: 147.0 ml LV vol s, MOD A2C: 41.4 ml LV vol s, MOD A4C: 64.4 ml LV SV MOD A2C:     68.6 ml LV SV MOD A4C:     147.0 ml LV SV MOD BP:      75.6 ml RIGHT VENTRICLE             IVC RV S prime:     11.10 cm/s  IVC diam: 1.30 cm TAPSE (M-mode): 2.3 cm LEFT ATRIUM  Index       RIGHT ATRIUM           Index LA diam:        3.60 cm 1.47 cm/m  RA Area:     16.20 cm LA Vol (A2C):   37.2 ml 15.19 ml/m RA Volume:   43.60 ml  17.80 ml/m LA Vol (A4C):   30.3 ml 12.37 ml/m LA Biplane Vol: 35.0 ml 14.29 ml/m  AORTIC VALVE LVOT Vmax:   101.00 cm/s LVOT Vmean:  63.900 cm/s LVOT VTI:    0.177 m  AORTA Ao Root diam: 3.80 cm Ao Asc diam:  4.40 cm MITRAL VALVE MV Area (PHT): 3.72 cm    SHUNTS MV Decel Time: 204 msec    Systemic VTI:  0.18 m MV E velocity:  69.20 cm/s  Systemic Diam: 2.30 cm MV A velocity: 83.30 cm/s MV E/A ratio:  0.83 Oswaldo Milian MD Electronically signed by Oswaldo Milian MD Signature Date/Time: 04/15/2021/2:54:04 PM    Final (Updated)    (Echo, Carotid, EGD, Colonoscopy, ERCP)    Subjective: Patient seen and examined.  No overnight events.  Denies any chest pain or shortness of breath.  Mobilized around.  Wife at the bedside.  Eager to go home.   Discharge Exam: Vitals:   04/16/21 0014 04/16/21 0400  BP: 114/77   Pulse: 67   Resp:    Temp: 98.4 F (36.9 C) 98.6 F (37 C)  SpO2:     Vitals:   04/15/21 1843 04/15/21 2101 04/16/21 0014 04/16/21 0400  BP: 109/84 126/68 114/77   Pulse: 88 68 67   Resp:  19    Temp:  98.5 F (36.9 C) 98.4 F (36.9 C) 98.6 F (37 C)  TempSrc:  Oral Oral Oral  SpO2:  93%    Weight:      Height:        General: Pt is alert, awake, not in acute distress Comfortable on room air. Cardiovascular: RRR, S1/S2 +, no rubs, no gallops Respiratory: CTA bilaterally, no wheezing, no rhonchi Abdominal: Soft, NT, ND, bowel sounds +, G-tube in place. Extremities: no edema, no cyanosis    The results of significant diagnostics from this hospitalization (including imaging, microbiology, ancillary and laboratory) are listed below for reference.     Microbiology: Recent Results (from the past 240 hour(s))  Resp Panel by RT-PCR (Flu A&B, Covid) Nasopharyngeal Swab     Status: None   Collection Time: 04/14/21  4:33 PM   Specimen: Nasopharyngeal Swab; Nasopharyngeal(NP) swabs in vial transport medium  Result Value Ref Range Status   SARS Coronavirus 2 by RT PCR NEGATIVE NEGATIVE Final    Comment: (NOTE) SARS-CoV-2 target nucleic acids are NOT DETECTED.  The SARS-CoV-2 RNA is generally detectable in upper respiratory specimens during the acute phase of infection. The lowest concentration of SARS-CoV-2 viral copies this assay can detect is 138 copies/mL. A negative result does  not preclude SARS-Cov-2 infection and should not be used as the sole basis for treatment or other patient management decisions. A negative result may occur with  improper specimen collection/handling, submission of specimen other than nasopharyngeal swab, presence of viral mutation(s) within the areas targeted by this assay, and inadequate number of viral copies(<138 copies/mL). A negative result must be combined with clinical observations, patient history, and epidemiological information. The expected result is Negative.  Fact Sheet for Patients:  EntrepreneurPulse.com.au  Fact Sheet for Healthcare Providers:  IncredibleEmployment.be  This test is no t yet approved or  cleared by the [place] and  has been authorized for detection and/or diagnosis of SARS-CoV-2 by FDA under an Emergency Use Authorization (EUA). This EUA will remain  in effect (meaning this test can be used) for the duration of the COVID-19 declaration under Section 564(b)(1) of the Act, 21 U.S.C.section 360bbb-3(b)(1), unless the authorization is terminated  or revoked sooner.       Influenza A by PCR NEGATIVE NEGATIVE Final   Influenza B by PCR NEGATIVE NEGATIVE Final    Comment: (NOTE) The Xpert Xpress SARS-CoV-2/FLU/RSV plus assay is intended as an aid in the diagnosis of influenza from Nasopharyngeal swab specimens and should not be used as a sole basis for treatment. Nasal washings and aspirates are unacceptable for Xpert Xpress SARS-CoV-2/FLU/RSV testing.  Fact Sheet for Patients: EntrepreneurPulse.com.au  Fact Sheet for Healthcare Providers: IncredibleEmployment.be  This test is not yet approved or cleared by the [place] FDA and has been authorized for detection and/or diagnosis of SARS-CoV-2 by FDA under an Emergency Use Authorization (EUA). This EUA will remain in effect (meaning this test can be used) for the  duration of the COVID-19 declaration under Section 564(b)(1) of the Act, 21 U.S.C. section 360bbb-3(b)(1), unless the authorization is terminated or revoked.  Performed at [facility], [address], [place], [place] [address]      Labs: BNP (last 3 results) No results for input(s): BNP in the last 8760 hours. Basic Metabolic Panel: Recent Labs  Lab 04/14/21 1222 04/14/21 2023 04/15/21 0731 04/15/21 1118 04/16/21 0204  NA 133*  --  135 136 137  K 3.4*  --  3.9 4.2 3.4*  CL 101  --  99 102 105  CO2 25  --  _0 GLUCOSE 170*  --  150* 152* 115*  BUN 9  --  6 5* 5*  CREATININE 0.68  --  0.61 0.61 0.55*  CALCIUM 8.9  --  9.0 9.3 8.7*  MG  --  1.9  --   --   --    Liver Function Tests: Recent Labs  Lab 04/15/21 0731  AST 61*  ALT 36  ALKPHOS 60  BILITOT 0.6  PROT 6.7  ALBUMIN 3.1*   No results for input(s): LIPASE, AMYLASE in the last 168 hours. No results for input(s): AMMONIA in the last 168 hours. CBC: Recent Labs  Lab 04/14/21 1222 04/15/21 0731 04/15/21 1118 04/16/21 0204  WBC 8.6 5.5 6.7 6.6  HGB 15.4 13.7 14.5 13.1  HCT 47.3 42.1 44.1 40.4  MCV 89.2 87.9 87.3 88.6  PLT 231 152 188 175   Cardiac Enzymes: No results for input(s): CKTOTAL, CKMB, CKMBINDEX, TROPONINI in the last 168 hours. BNP: Invalid input(s): POCBNP CBG: Recent Labs  Lab 04/15/21 0736 04/15/21 1142 04/15/21 1627 04/15/21 2131 04/16/21 0812  GLUCAP 154* 170* 137* 118* 131*   D-Dimer No results for input(s): DDIMER in the last 72 hours. Hgb A1c No results for input(s): HGBA1C in the last 72 hours. Lipid Profile Recent Labs    04/15/21 0731  CHOL 141  HDL 26*  LDLCALC 93  TRIG 109  CHOLHDL 5.4   Thyroid function studies No results for input(s): TSH, T4TOTAL, T3FREE, THYROIDAB in the last 72 hours.  Invalid input(s): FREET3 Anemia work up No results for input(s): VITAMINB12, FOLATE, FERRITIN, TIBC, IRON, RETICCTPCT in the last 72 hours. Urinalysis     Component Value Date/Time   COLORURINE AMBER (A) 03/06/2021 0905   APPEARANCEUR HAZY (A) 03/06/2021 0240  LABSPEC 1.023 03/06/2021 0905   PHURINE 6.0 03/06/2021 0905   GLUCOSEU NEGATIVE 03/06/2021 0905   HGBUR NEGATIVE 03/06/2021 0905   BILIRUBINUR NEGATIVE 03/06/2021 0905   KETONESUR 80 (A) 03/06/2021 0905   PROTEINUR 100 (A) 03/06/2021 0905   UROBILINOGEN 0.2 11/24/2009 1658   NITRITE NEGATIVE 03/06/2021 0905   LEUKOCYTESUR NEGATIVE 03/06/2021 0905   Sepsis Labs Invalid input(s): PROCALCITONIN,  WBC,  LACTICIDVEN Microbiology Recent Results (from the past 240 hour(s))  Resp Panel by RT-PCR (Flu A&B, Covid) Nasopharyngeal Swab     Status: None   Collection Time: 04/14/21  4:33 PM   Specimen: Nasopharyngeal Swab; Nasopharyngeal(NP) swabs in vial transport medium  Result Value Ref Range Status   SARS Coronavirus 2 by RT PCR NEGATIVE NEGATIVE Final    Comment: (NOTE) SARS-CoV-2 target nucleic acids are NOT DETECTED.  The SARS-CoV-2 RNA is generally detectable in upper respiratory specimens during the acute phase of infection. The lowest concentration of SARS-CoV-2 viral copies this assay can detect is 138 copies/mL. A negative result does not preclude SARS-Cov-2 infection and should not be used as the sole basis for treatment or other patient management decisions. A negative result may occur with  improper specimen collection/handling, submission of specimen other than nasopharyngeal swab, presence of viral mutation(s) within the areas targeted by this assay, and inadequate number of viral copies(<138 copies/mL). A negative result must be combined with clinical observations, patient history, and epidemiological information. The expected result is Negative.  Fact Sheet for Patients:  EntrepreneurPulse.com.au  Fact Sheet for Healthcare Providers:  IncredibleEmployment.be  This test is no t yet approved or cleared by the [place] FDA  and  has been authorized for detection and/or diagnosis of SARS-CoV-2 by FDA under an Emergency Use Authorization (EUA). This EUA will remain  in effect (meaning this test can be used) for the duration of the COVID-19 declaration under Section 564(b)(1) of the Act, 21 U.S.C.section 360bbb-3(b)(1), unless the authorization is terminated  or revoked sooner.       Influenza A by PCR NEGATIVE NEGATIVE Final   Influenza B by PCR NEGATIVE NEGATIVE Final    Comment: (NOTE) The Xpert Xpress SARS-CoV-2/FLU/RSV plus assay is intended as an aid in the diagnosis of influenza from Nasopharyngeal swab specimens and should not be used as a sole basis for treatment. Nasal washings and aspirates are unacceptable for Xpert Xpress SARS-CoV-2/FLU/RSV testing.  Fact Sheet for Patients: EntrepreneurPulse.com.au  Fact Sheet for Healthcare Providers: IncredibleEmployment.be  This test is not yet approved or cleared by the [place] FDA and has been authorized for detection and/or diagnosis of SARS-CoV-2 by FDA under an Emergency Use Authorization (EUA). This EUA will remain in effect (meaning this test can be used) for the duration of the COVID-19 declaration under Section 564(b)(1) of the Act, 21 U.S.C. section 360bbb-3(b)(1), unless the authorization is terminated or revoked.  Performed at [facility], [address], [place], [place] [address]      Time coordinating discharge:  32 minutes  SIGNED:   Barb Merino, MD  [facility] 04/16/2021, 12:36 PM

## 2021-04-16 NOTE — Progress Notes (Signed)
Progress Note  Patient Name: KAYDEN HUTMACHER Date of Encounter: 04/16/2021  [facility] Cardiologist: Ida Rogue, MD   Subjective   No chest pain  Inpatient Medications    Scheduled Meds:  aspirin  81 mg Oral Daily   atorvastatin  40 mg Oral q1800   docusate  100 mg Per Tube BID   feeding supplement (OSMOLITE 1.2 CAL)  237 mL Per Tube QID   feeding supplement (PROSource TF)  45 mL Per Tube BID   free water  60 mL Per Tube Q8H   insulin aspart  0-5 Units Subcutaneous QHS   insulin aspart  0-9 Units Subcutaneous TID WC   losartan  25 mg Per J Tube Daily   metoprolol tartrate  50 mg Per Tube BID   multivitamin  15 mL Per Tube Daily   nitroGLYCERIN  0.4 mg Sublingual Once   pantoprazole sodium  40 mg Per Tube Daily   potassium chloride  40 mEq Per Tube BID   protein supplement  1 Scoop Per Tube Q3H while awake   sodium chloride flush  3 mL Intravenous Q12H   ticagrelor  90 mg Oral BID   Continuous Infusions:  sodium chloride     PRN Meds: sodium chloride, acetaminophen **OR** acetaminophen, acetaminophen, bisacodyl, morphine injection, ondansetron **OR** ondansetron (ZOFRAN) IV, polyethylene glycol, sodium chloride flush   Vital Signs    Vitals:   04/15/21 1843 04/15/21 2101 04/16/21 0014 04/16/21 0400  BP: 109/84 126/68 114/77   Pulse: 88 68 67   Resp:  19    Temp:  98.5 F (36.9 C) 98.4 F (36.9 C) 98.6 F (37 C)  TempSrc:  Oral Oral Oral  SpO2:  93%    Weight:      Height:        Intake/Output Summary (Last 24 hours) at 04/16/2021 0958 Last data filed at 04/15/2021 1800 Gross per 24 hour  Intake 1166.22 ml  Output --  Net 1166.22 ml   Last 3 Weights 04/14/2021 03/27/2021 03/25/2021  Weight (lbs) 265 lb 292 lb 5.3 oz 292 lb 5.3 oz  Weight (kg) 120.203 kg 132.6 kg 132.6 kg      Telemetry    Sinus - Personally Reviewed  ECG    NSR, RBB - Personally Reviewed  Physical Exam   General: Well developed, well nourished, NAD  HEENT: OP clear,  mucus membranes moist  SKIN: warm, dry. No rashes. Neuro: No focal deficits  Musculoskeletal: Muscle strength 5/5 all ext  Psychiatric: Mood and affect normal  Neck: No JVD, no carotid bruits, no thyromegaly, no lymphadenopathy.  Lungs:Clear bilaterally, no wheezes, rhonci, crackles Cardiovascular: Regular rate and rhythm. No murmurs, gallops or rubs. Abdomen:Soft. Bowel sounds present. Non-tender.  Extremities: No lower extremity edema. Pulses are 2 + in the bilateral DP/PT.  Labs    High Sensitivity Troponin:   Recent Labs  Lab 04/14/21 1222 04/14/21 1358 04/14/21 1612 04/15/21 0731  TROPONINIHS 7 192* 3,399* 6,719*      Chemistry Recent Labs  Lab 04/15/21 0731 04/15/21 1118 04/16/21 0204  NA 135 136 137  K 3.9 4.2 3.4*  CL 99 102 105  CO2 28 23 25   GLUCOSE 150* 152* 115*  BUN 6 5* 5*  CREATININE 0.61 0.61 0.55*  CALCIUM 9.0 9.3 8.7*  PROT 6.7  --   --   ALBUMIN 3.1*  --   --   AST 61*  --   --   ALT 36  --   --  ALKPHOS 60  --   --   BILITOT 0.6  --   --   GFRNONAA >60 >60 >60  ANIONGAP 8 11 7      Hematology Recent Labs  Lab 04/15/21 0731 04/15/21 1118 04/16/21 0204  WBC 5.5 6.7 6.6  RBC 4.79 5.05 4.56  HGB 13.7 14.5 13.1  HCT 42.1 44.1 40.4  MCV 87.9 87.3 88.6  MCH 28.6 28.7 28.7  MCHC 32.5 32.9 32.4  RDW 14.6 14.6 14.6  PLT 152 188 175    BNPNo results for input(s): BNP, PROBNP in the last 168 hours.   DDimer No results for input(s): DDIMER in the last 168 hours.   Radiology    DG Chest 2 View  Result Date: 04/14/2021 CLINICAL DATA:  Right-sided chest pain. EXAM: CHEST - 2 VIEW COMPARISON:  11/08/2019 FINDINGS: The heart size and mediastinal contours are within normal limits. Both lungs are clear. The visualized skeletal structures are unremarkable. IMPRESSION: No active cardiopulmonary disease. Electronically Signed   By: Marlaine Hind M.D.   On: 04/14/2021 13:06   CT Chest W Contrast  Result Date: 04/14/2021 CLINICAL DATA:  Status  post Roux-en-Y gastric bypass NG tube placement, chest pain, vomiting, concern for surgical complication EXAM: CT CHEST AND ABDOMEN WITH CONTRAST TECHNIQUE: Multidetector CT imaging of the chest and abdomen was performed following the standard protocol during bolus administration of intravenous contrast. CONTRAST:  43m OMNIPAQUE IOHEXOL 350 MG/ML SOLN, additional oral enteric contrast COMPARISON:  None. FINDINGS: CT CHEST FINDINGS Cardiovascular: Scattered aortic atherosclerosis. Normal heart size. Three-vessel coronary artery calcifications. No pericardial effusion. Mediastinum/Nodes: No enlarged mediastinal, hilar, or axillary lymph nodes. Thyroid gland, trachea, and esophagus demonstrate no significant findings. Lungs/Pleura: Mild, diffuse bilateral bronchial wall thickening. Background of very fine centrilobular nodules throughout the lungs, most concentrated in the apices. No pleural effusion or pneumothorax. Musculoskeletal: No chest wall mass or suspicious bone lesions identified. CT ABDOMEN FINDINGS Hepatobiliary: No focal liver abnormality is seen. Somewhat coarse, nodular contour of the liver. Status post cholecystectomy. No biliary ductal dilatation. Pancreas: Unremarkable. No pancreatic ductal dilatation or surrounding inflammatory changes. Spleen: Normal in size without focal abnormality. Adrenals/Urinary Tract: Adrenal glands are unremarkable. Kidneys are normal, without renal calculi, focal lesion, or hydronephrosis. Bladder is unremarkable. Stomach/Bowel: Postoperative findings of Roux-en-Y type gastric bypass with additional percutaneous gastrostomy of the gastric remnant. The gastric remnant is fluid-filled although not distended. Appendix appears normal. No evidence of bowel wall thickening, distention, or inflammatory changes. Vascular/Lymphatic: Aortic atherosclerosis. No enlarged abdominal or pelvic lymph nodes. Other: No abdominal wall hernia or abnormality. Minimal residua of a prior right  rectus sheath hematoma (series 2, image 80) no abdominopelvic ascites. Musculoskeletal: No acute or significant osseous findings. IMPRESSION: 1. Postoperative findings of Roux-en-Y type gastric bypass with additional percutaneous gastrostomy of the gastric remnant. The gastric remnant is fluid-filled although not distended. 2. No evidence of bowel obstruction or other surgical complication. 3. Somewhat coarse, nodular contour of the liver, suggestive of cirrhosis. 4. Background of very fine centrilobular nodules throughout the lungs, most concentrated in the apices, most consistent with smoking-related respiratory bronchiolitis. 5. Minimal residua of a prior right rectus sheath hematoma. 6. Coronary artery disease. Aortic Atherosclerosis (ICD10-I70.0). Electronically Signed   By: AEddie CandleM.D.   On: 04/14/2021 15:22   CT ABDOMEN W CONTRAST  Result Date: 04/14/2021 CLINICAL DATA:  Status post Roux-en-Y gastric bypass NG tube placement, chest pain, vomiting, concern for surgical complication EXAM: CT CHEST AND ABDOMEN WITH CONTRAST TECHNIQUE: Multidetector CT  imaging of the chest and abdomen was performed following the standard protocol during bolus administration of intravenous contrast. CONTRAST:  28m OMNIPAQUE IOHEXOL 350 MG/ML SOLN, additional oral enteric contrast COMPARISON:  None. FINDINGS: CT CHEST FINDINGS Cardiovascular: Scattered aortic atherosclerosis. Normal heart size. Three-vessel coronary artery calcifications. No pericardial effusion. Mediastinum/Nodes: No enlarged mediastinal, hilar, or axillary lymph nodes. Thyroid gland, trachea, and esophagus demonstrate no significant findings. Lungs/Pleura: Mild, diffuse bilateral bronchial wall thickening. Background of very fine centrilobular nodules throughout the lungs, most concentrated in the apices. No pleural effusion or pneumothorax. Musculoskeletal: No chest wall mass or suspicious bone lesions identified. CT ABDOMEN FINDINGS Hepatobiliary: No  focal liver abnormality is seen. Somewhat coarse, nodular contour of the liver. Status post cholecystectomy. No biliary ductal dilatation. Pancreas: Unremarkable. No pancreatic ductal dilatation or surrounding inflammatory changes. Spleen: Normal in size without focal abnormality. Adrenals/Urinary Tract: Adrenal glands are unremarkable. Kidneys are normal, without renal calculi, focal lesion, or hydronephrosis. Bladder is unremarkable. Stomach/Bowel: Postoperative findings of Roux-en-Y type gastric bypass with additional percutaneous gastrostomy of the gastric remnant. The gastric remnant is fluid-filled although not distended. Appendix appears normal. No evidence of bowel wall thickening, distention, or inflammatory changes. Vascular/Lymphatic: Aortic atherosclerosis. No enlarged abdominal or pelvic lymph nodes. Other: No abdominal wall hernia or abnormality. Minimal residua of a prior right rectus sheath hematoma (series 2, image 80) no abdominopelvic ascites. Musculoskeletal: No acute or significant osseous findings. IMPRESSION: 1. Postoperative findings of Roux-en-Y type gastric bypass with additional percutaneous gastrostomy of the gastric remnant. The gastric remnant is fluid-filled although not distended. 2. No evidence of bowel obstruction or other surgical complication. 3. Somewhat coarse, nodular contour of the liver, suggestive of cirrhosis. 4. Background of very fine centrilobular nodules throughout the lungs, most concentrated in the apices, most consistent with smoking-related respiratory bronchiolitis. 5. Minimal residua of a prior right rectus sheath hematoma. 6. Coronary artery disease. Aortic Atherosclerosis (ICD10-I70.0). Electronically Signed   By: AEddie CandleM.D.   On: 04/14/2021 15:22   CARDIAC CATHETERIZATION  Result Date: 04/15/2021 Images from the original result were not included.   Prox LAD lesion is 80% stenosed.   1st Diag lesion is 90% stenosed.   A drug-eluting stent was  successfully placed.   Post intervention, there is a 0% residual stenosis. GPHILLIP MAFFEIis a 60y.o. male  [id number]:  [facility]: JQuay Burow M.D. [id number] OF PROCEDURE:  04/15/2021 DATE OF DISCHARGE: CARDIAC CATHETERIZATION / PCI -DES LAD History obtained from chart review.GDEONTE OTTINGis a 60y.o. 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 PROCEDURE DESCRIPTION: The patient was brought to the second floor [facility] cath lab in the postabsorptive state. He was premedicated with IV Versed and fentanyl. His right wristwas prepped and shaved in usual sterile fashion. Xylocaine 1% was used for local anesthesia. A 6 French sheath was inserted into the right radial  artery using standard Seldinger technique. The patient received 6000 units  of heparin intravenously.  5 [place] catheter, left Judkins catheter were used for selective coronary angiography and obtain left heart pressures.  Isovue dye was used for the entirety of the case (120 cc total administered to patient).  Retrograde aortic, left ventricular and pullback  pressures were recorded.  Radial cocktail was administered via the SideArm sheath. Because the patient was n.p.o. he was begun on cangrelor bolus followed by infusion for antiplatelet therapy.  He received a total of 15,000 units of heparin with an ACT of 277.  Isovue dye was used for the entirety of the intervention.  Retrograde aortic pressures monitored in the case. Using a 6 [place] XB 3.0 LAD guide cath along with 0.14 [place] guidewire I predilated the proximal LAD with a 2 mm x 12 mm balloon.  Following  this I carefully positioned a 3 mm x 18 mm long Medtronic frontier drug-eluting stent and deployed at 14 atm.  I postdilated with a 3.25 mm x 15 mm long noncompliant balloon at 16 atm (3.35 mm) resulting in reduction of a an 80% fairly focal stenosis just before the first diagonal branch to 0% residual.  There was only mild encroachment of the diagonal branch ostium.  The patient tolerated procedure well.  He received crushed Brilinta down his PEG tube.  Cangrelor will be discontinued in 3 hours.    Patient was admitted with a non-STEMI.  The culprit lesion was a diagonal branch.  He did have an 80% proximal LAD lesion which underwent PCI and drug-eluting stenting successfully without compromise of the diagonal branch ostium.  His LV function was normal by visual inspection of his 2D echocardiogram and his LVEDP was 14.  He will need to be on dual antiplatelet therapy uninterrupted for 12 months.  This may be an issue with his PEJ tube.  The sheath was removed and a TR band was placed on the right wrist to achieve patent hemostasis.  The patient left the lab in stable condition.  Dr. Burt Knack is aware of the result. Quay Burow. MD, [facility] 04/15/2021 3:48 PM    ECHOCARDIOGRAM COMPLETE  Result Date: 04/15/2021    ECHOCARDIOGRAM REPORT   Patient Name:   LIJAH BOURQUE Date of Exam: 04/15/2021 Medical Rec #:  [id number]         Height:       74.0 in Accession #:    [id number]        Weight:       265.0 lb Date of Birth:  [birth year]        BSA:          2.450 m Patient Age:    60 years          BP:           115/69 mmHg Patient Gender: M                 HR:           80 bpm. Exam Location:  Inpatient Procedure: 2D Echo, Cardiac Doppler, Color Doppler and Intracardiac            Opacification Agent Indications:     122-I22.9 Subsequent ST elevation (STEM) and non-ST elevation                  (NSTEMI) myocardial infarction  History:         Patient has prior history of Echocardiogram examinations, most                   recent 04/01/2019. CAD, Abnormal ECG, Arrythmias:Tachycardia,                  Signs/Symptoms:Chest Pain and Dyspnea; Risk  Factors:Dyslipidemia and Former Smoker.  Sonographer:     Roseanna Rainbow RDCS Referring Phys:  [id number] [facility] Diagnosing Phys: Oswaldo Milian MD  Sonographer Comments: Technically difficult study due to poor echo windows and patient is morbidly obese. Image acquisition challenging due to patient body habitus. IMPRESSIONS  1. Left ventricular ejection fraction, by estimation, is 50 to 55%. The left ventricle has low normal function. The left ventricle demonstrates regional wall motion abnormalities. Apical hypokinesis. There is moderate asymmetric left ventricular hypertrophy of the basal-septal segment. Left ventricular diastolic parameters are indeterminate.  2. Right ventricular systolic function is normal. The right ventricular size is mildly enlarged. Tricuspid regurgitation signal is inadequate for assessing PA pressure.  3. The mitral valve is normal in structure. No evidence of mitral valve regurgitation. No evidence of mitral stenosis.  4. The aortic valve is tricuspid. Aortic valve regurgitation is not visualized. Mild aortic valve sclerosis is present, with no evidence of aortic valve stenosis.  5. Aortic dilatation noted. There is dilatation of the ascending aorta, measuring 44 mm. FINDINGS  Left Ventricle: Left ventricular ejection fraction, by estimation, is 50 to 55%. The left ventricle has low normal function. The left ventricle demonstrates regional wall motion abnormalities. Definity contrast agent was given IV to delineate the left ventricular endocardial borders. The left ventricular internal cavity size was normal in size. There is moderate asymmetric left ventricular hypertrophy of the basal-septal segment. Left ventricular diastolic parameters are indeterminate. Right Ventricle: The right ventricular size is mildly enlarged. Right vetricular wall  thickness was not well visualized. Right ventricular systolic function is normal. Tricuspid regurgitation signal is inadequate for assessing PA pressure. Left Atrium: Left atrial size was normal in size. Right Atrium: Right atrial size was normal in size. Pericardium: There is no evidence of pericardial effusion. Mitral Valve: The mitral valve is normal in structure. No evidence of mitral valve regurgitation. No evidence of mitral valve stenosis. Tricuspid Valve: The tricuspid valve is normal in structure. Tricuspid valve regurgitation is trivial. Aortic Valve: The aortic valve is tricuspid. Aortic valve regurgitation is not visualized. Mild aortic valve sclerosis is present, with no evidence of aortic valve stenosis. Pulmonic Valve: The pulmonic valve was not well visualized. Pulmonic valve regurgitation is not visualized. Aorta: The aortic root is normal in size and structure and aortic dilatation noted. There is dilatation of the ascending aorta, measuring 44 mm. IAS/Shunts: The interatrial septum was not well visualized.  LEFT VENTRICLE PLAX 2D LVIDd:         5.20 cm      Diastology LVIDs:         3.50 cm      LV e' medial:    4.35 cm/s LV PW:         1.10 cm      LV E/e' medial:  15.9 LV IVS:        1.40 cm      LV e' lateral:   5.44 cm/s LVOT diam:     2.30 cm      LV E/e' lateral: 12.7 LV SV:         74 LV SV Index:   30 LVOT Area:     4.15 cm  LV Volumes (MOD) LV vol d, MOD A2C: 110.0 ml LV vol d, MOD A4C: 147.0 ml LV vol s, MOD A2C: 41.4 ml LV vol s, MOD A4C: 64.4 ml LV SV MOD A2C:     68.6 ml LV SV MOD A4C:     147.0  ml LV SV MOD BP:      75.6 ml RIGHT VENTRICLE             IVC RV S prime:     11.10 cm/s  IVC diam: 1.30 cm TAPSE (M-mode): 2.3 cm LEFT ATRIUM             Index       RIGHT ATRIUM           Index LA diam:        3.60 cm 1.47 cm/m  RA Area:     16.20 cm LA Vol (A2C):   37.2 ml 15.19 ml/m RA Volume:   43.60 ml  17.80 ml/m LA Vol (A4C):   30.3 ml 12.37 ml/m LA Biplane Vol: 35.0 ml 14.29  ml/m  AORTIC VALVE LVOT Vmax:   101.00 cm/s LVOT Vmean:  63.900 cm/s LVOT VTI:    0.177 m  AORTA Ao Root diam: 3.80 cm Ao Asc diam:  4.40 cm MITRAL VALVE MV Area (PHT): 3.72 cm    SHUNTS MV Decel Time: 204 msec    Systemic VTI:  0.18 m MV E velocity: 69.20 cm/s  Systemic Diam: 2.30 cm MV A velocity: 83.30 cm/s MV E/A ratio:  0.83 Oswaldo Milian MD Electronically signed by Oswaldo Milian MD Signature Date/Time: 04/15/2021/2:54:04 PM    Final (Updated)     Cardiac Studies   Echocardiogram from 04/01/2019:   1. The left ventricle has normal systolic function, with an ejection  fraction of 55-60%. The cavity size was normal. Left ventricular diastolic  parameters were normal.   2. The right ventricle has normal systolic function. The cavity was  normal. There is no increase in right ventricular wall thickness. Right  ventricular systolic pressure could not be assessed.   3. Left atrial size was mildly dilated.   4. The mitral valve is grossly normal.   5. The tricuspid valve is grossly normal.   6. The aortic valve is grossly normal. No stenosis of the aortic valve.   7. The aorta is abnormal in size and structure.   8. There is mild to moderate dilatation of the aortic root and of the  ascending aorta measuring 42 mm.     Patient Profile     CHICO CAWOOD is a 60 y.o. 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  Assessment & Plan     Non-STEMI: Pt admitted with NSTEMI. Cardiac cath 04/15/21 with severe proximal LAD stenosis treated with a drug eluting stent. Also with a severe lesion in the Diagonal branch which is the likely culprit lesion but this vessel  is too small for PCI. Continue ASA and Brilinta for one year. Continue beta blocker and statin.   NSVT: Continue beta blocker.   Hypertension: BP controlled. No changes today.   Hyperlipidemia: Continue statin.   Paroxysmal atrial tachycardia/SVT: Sinus today. Continue beta blocker. Resume home dose of Cardizem.   OK to discharge home today from cardiac perspective.   For questions or updates, please contact [place] Please consult [email or web address] for contact info under     Signed, Lauree Chandler, MD  04/16/2021, 9:58 AM

## 2021-04-17 ENCOUNTER — Telehealth: Payer: Self-pay | Admitting: Nurse Practitioner

## 2021-04-17 ENCOUNTER — Encounter: Payer: No Typology Code available for payment source | Attending: General Surgery | Admitting: Skilled Nursing Facility1

## 2021-04-17 ENCOUNTER — Ambulatory Visit: Payer: No Typology Code available for payment source | Admitting: Skilled Nursing Facility1

## 2021-04-17 DIAGNOSIS — E1165 Type 2 diabetes mellitus with hyperglycemia: Secondary | ICD-10-CM | POA: Insufficient documentation

## 2021-04-17 NOTE — Telephone Encounter (Signed)
Left message on machine to call back  

## 2021-04-17 NOTE — Telephone Encounter (Signed)
Pt c/o of Chest Pain: STAT if CP now or developed within 24 hours  1. Are you having CP right now? no  2. Are you experiencing any other symptoms (ex. SOB, nausea, vomiting, sweating)? Dizziness this morning after metoprolol   3. How long have you been experiencing CP? 1 episode last night 10-15 seconds   4. Is your CP continuous or coming and going? 1 episode but recent mi and stent at [facility]   5. Have you taken Nitroglycerin? No   Patient wife calling to discuss episode of cp last night s/p stent.  Patient also reports dizziness after metoprolol dose this morning ( recent increase dose from 2.5-10 mL)   Wife would like to go over medications from discharge to make sure she is giving patient correct meds since several changes made.   Current VS  133/88 bp 96 hr       ?

## 2021-04-17 NOTE — Telephone Encounter (Signed)
I spoke with the patient's wife, Alvis Lemmings. The patient was discharged from the hospital on 04/16/21 around lunch time.  He was admitted with a NSTEMI and cathed on 04/15/21 by Dr. Allyson Sabal: IMPRESSION: Patient was admitted with a non-STEMI.  The culprit lesion was a diagonal branch.  He did have an 80% proximal LAD lesion which underwent PCI and drug-eluting stenting successfully without compromise of the diagonal branch ostium  Per Dawn, the patient is receiving his meds through a G-tube. He was given Metoprolol, ASA, Omeprazole, Brilinta, Potassium, Zyrtec, and a MVT this morning through his tube. His CBG was 150, so he did receive insulin as well this morning. Dawn advised that ~ 20-30 minutes after receiving these meds, the patient had reports of dizziness.  He is currently asymptomatic, but she advised that his dose of metoprolol was increased from 2.16mL>> 10 mL (25 mg) and she was concerned this was too much.  They did not take the patient's vitals signs last night, but reported his BP (HR) as 133/88 (96) after his morning meds today.  I inquired if the patient's dizziness occurred with positional changes and Dawn advised this was when the patient reported dizziness.   The patient is due to take Diltiazem, HCTZ, Losartan, Atorvastatin tonight.  I have advised Dawn: 1) I feel that the patient's current vital signs support the medications he is taking as prescribed 2) I asked that she have the patient sit for 10 minutes and then take a sitting and standing BP (HR) 3) Continue to monitor his episodes of dizziness and call back with any increased or worsening symptoms  Dawn advised the patient did have some chest pain last night that only lasted seconds, but he did not have any dizziness prior to this admission.  Dawn voices understanding of the above recommendations and is agreeable.  The patient is scheduled to follow up on 04/22/21 with Dr. Mariah Milling.

## 2021-04-18 ENCOUNTER — Other Ambulatory Visit: Payer: Self-pay | Admitting: *Deleted

## 2021-04-18 NOTE — Telephone Encounter (Signed)
Left message on machine to call back   Pt is on Brilinta will send anti coag letter

## 2021-04-18 NOTE — Patient Outreach (Signed)
[facility]) Care Management  04/18/2021  Hector Curry October 11, 1960 [id number]   Transition of care telephone call  Referral received: 04/16/21 ( notification of readmission discharge) .  Initial outreach:04/18/21 Insurance: [place] [employer]  Initial unsuccessful telephone call to patient's preferred number in order to complete transition of care assessment; no answer, left HIPAA compliant voicemail message requesting return call.  Placed call to patient wife Hector Curry , [place] mobile number no answer able to leave a HIPAA compliant voicemail message.   Objective: Per the electronic medical record, Hector Curry was  was hospitalized at [facility] 8/ 21-8/23/22 for Chest pain , NSTEMI severe proximal LAD stenosis treated with drug eluting stent, also with a severe lesion in Diagonal branch  too small for PCI . Comorbidities/PMHx include: CAD, hypertension , obesity ,type 2 diabetes OSA, on Bipap, s/p lap band transition to gastric bypass 02/18/21, with subsequent development of GJ anastomosis stricture and gastrostomy tube placement 03/20/21.  He  was discharged to home on 04/16/21  with resumption of home health services  with [facility]  health services no new durable medical equipment  needs per the discharge summary.   Plan: This RNCM will route unsuccessful outreach letter with [facility] Care Management pamphlet and 24 hour Nurse Advice Line Magnet to [employer] Care Management clinical pool to be mailed to patient's home address. This RNCM will attempt another outreach within 4 business days.    Egbert Garibaldi, RN, BSN  [facility] Care Management,Care Management Coordinator  [phone]- Mobile [phone]- Toll Free Main Office

## 2021-04-19 NOTE — Progress Notes (Signed)
Patient ID: Hector Curry, male   DOB: [birth year], 60 y.o.   MRN: [id number] Cardiology Office Note  Date:  04/22/2021   ID:  Hector Curry, DOB [birth year], MRN [id number]  PCP:  Hector Arbour, MD   Chief Complaint  Patient presents with   Hospitalization Follow-up    "Doing well." Medications reviewed by the patient verbally.     HPI:  Hector Curry is a pleasant 60 year old male with history of  obesity, LAP-BAND,  obstructive sleep apnea who wears BIPAP,  15 years of smoking who stopped several years ago,  strong family history of coronary artery disease with father who passed away from MI in his late 23s,  Previous episode of chest pain, cardiac catheterization 04/2015 showing no significant coronary disease who presents  For follow-up Of his atrial tachycardia  LOV with myself July 2020  s/p lap band transitioned to gastric bypass 02/18/2021 with subsequent development of GJ anastomosis stricture and gastrostomy tube placement 03/20/2021  Complication post surgery, stricture  Cath 04/15/2021   Prox LAD lesion is 80% stenosed.   1st Diag lesion is 90% stenosed.   A drug-eluting stent was successfully placed.   Post intervention, there is a 0% residual stenosis.  Scheduled to see Dr. Corliss Curry, Hector Curry Consultation for consideration of noninvasive intervention to his outflow obstruction  During his visit today, periodically throwing up in a bag Reports continued weight loss Getting ready to go back to work  EKG personally reviewed by myself on todays visit Normal sinus rhythm with rate 67 bpm no significant ST-T wave changes  Other past medical history In early September 2016, reported having tachycardia He went to the emergency room, reported having EKG at that time documenting tachycardia. None is available in the computer  30 day monitor was ordered showing no significant arrhythmia He does report that he had a episode of tachycardia but he did not  hit the button He did show APCs  Prior Holter monitor showed short runs of atrial tachycardia Many of his symptoms are at nighttime. He does have sleep apnea, wears CPAP, scheduled for repeat sleep study. Last was 15 years ago  Prior episodes of chest pain. Symptoms typically presenting at nighttime Symptoms are rare but will wake him up from sleep.   Not been exercising secondary to chronic joint issues  PMH:   has a past medical history of Allergic rhinitis, Atrial fibrillation (HCC), Coronary artery disease, non-occlusive, Diabetes mellitus without complication (HCC), Diastolic dysfunction, Dilated aortic root (HCC), Diverticulosis (10/03/2014), Dysrhythmia, Family history of premature CAD, GERD (gastroesophageal reflux disease), Hemorrhoids, History of kidney stones, History of tobacco abuse, Hyperlipidemia, Hyperplastic colon polyp (10/03/2014), Hypertension, Inflammatory arthritis, Morbid obesity (HCC), OSA (obstructive sleep apnea), Osteoarthritis, and PSVT (paroxysmal supraventricular tachycardia) (HCC).  PSH:    Past Surgical History:  Procedure Laterality Date   BARIATRIC SURGERY     lap band    CARDIAC CATHETERIZATION N/A 05/08/2015   Procedure: Left Heart Cath and Coronary Angiography;  Surgeon: Corky Crafts, MD;  Location: [facility] INVASIVE CV LAB;  Service: Cardiovascular;  Laterality: N/A;   CARPAL TUNNEL RELEASE Left    CHOLECYSTECTOMY     COLONOSCOPY  06/27/2005   COLONOSCOPY  10/03/2014   CORONARY STENT INTERVENTION N/A 04/15/2021   Procedure: CORONARY STENT INTERVENTION;  Surgeon: Runell Gess, MD;  Location: [facility] INVASIVE CV LAB;  Service: Cardiovascular;  Laterality: N/A;   ESOPHAGOGASTRODUODENOSCOPY  06/27/2005   ESOPHAGOGASTRODUODENOSCOPY N/A 03/19/2021   Procedure: ESOPHAGOGASTRODUODENOSCOPY (EGD);  Surgeon:  Kinsinger, De Blanch, MD;  Location: [facility] ENDOSCOPY;  Service: General;  Laterality: N/A;   GASTRIC ROUX-EN-Y N/A 02/18/2021   Procedure: LAPAROSCOPIC  ROUX-EN-Y GASTRIC BYPASS WITH UPPER ENDOSCOPY,;  Surgeon: Sheliah Hatch De Blanch, MD;  Location: [facility] ORS;  Service: General;  Laterality: N/A;   HEMORRHOID SURGERY  08/25/1997   IR GJ TUBE CHANGE  03/27/2021   LAPAROSCOPIC INSERTION GASTROSTOMY TUBE N/A 03/20/2021   Procedure: LAPAROSCOPIC INSERTION GASTROSTOMY TUBE;  Surgeon: Rodman Pickle, MD;  Location: [facility] ORS;  Service: General;  Laterality: N/A;   LEFT HEART CATH AND CORONARY ANGIOGRAPHY N/A 04/15/2021   Procedure: LEFT HEART CATH AND CORONARY ANGIOGRAPHY;  Surgeon: Runell Gess, MD;  Location: [facility] INVASIVE CV LAB;  Service: Cardiovascular;  Laterality: N/A;   MOUTH SURGERY     removed area which was benign   TONSILLECTOMY     UPPER GI ENDOSCOPY N/A 03/20/2021   Procedure: UPPER GI ENDOSCOPY;  Surgeon: Sheliah Hatch De Blanch, MD;  Location: [facility] ORS;  Service: General;  Laterality: N/A;    Current Outpatient Medications  Medication Sig Dispense Refill   aspirin 81 MG chewable tablet Place 1 tablet (81 mg total) into feeding tube daily. 30 tablet 0   atorvastatin (LIPITOR) 40 MG tablet Place 1 tablet (40 mg total) into feeding tube daily at 6 PM. 30 tablet 2   Continuous Blood Gluc Sensor (FREESTYLE LIBRE 2 SENSOR) MISC Use as directed every 14 days 10 each 1   diltiazem (CARDIZEM) 10 mg/ml SOLN oral suspension Place 6 mLs (60 mg total) into feeding tube every 8 (eight) hours. 540 mL 0   hydrochlorothiazide 10 mg/mL SUSP Place 0.63 mLs (6.25 mg total) into feeding tube daily. 18.9 mL 0   insulin lispro (HUMALOG) 100 UNIT/ML KwikPen Inject 0-20 Units into the skin 3 (three) times daily with meals. (according to sliding scale) 15 mL 2   Insulin Pen Needle 32G X 4 MM MISC Use as needed as directed 100 each 3   losartan (COZAAR) 25 MG tablet 1 tablet (25 mg total) by Per J Tube route daily. 30 tablet 0   metoprolol tartrate (LOPRESSOR) 25 mg/10 mL SUSP Place 10 mLs (25 mg total) into feeding tube 2 (two) times daily. 600 mL 0   Multiple  Vitamin (MULTIVITAMIN) LIQD Place 15 mLs into feeding tube daily. 450 mL 2   Multiple Vitamins-Minerals (BARIATRIC MULTIVITAMINS/IRON PO) Place 1 tablet into feeding tube daily. With Vit B complex     Nutritional Supplements (FEEDING SUPPLEMENT, OSMOLITE 1.2 CAL,) LIQD Place 1,000 mLs into feeding tube continuous. -Begin with 50 ml Osmolite 1.2 via G-tube every 2 hours (8 feedings total while awake) -Free water flush with 30 ml before and after each bolus As tolerated work up to 100 ml Osmolite 1.2 7 times daily (~every 2 hours). -Continue Prosource TF BID. -3 cartons total of Osmolite 1.2 + Prosource TF BID provides 935 kcals (66% of needs), 61g protein (77% of needs), 112g CHO and 1005 ml H2O. -Free water of 240 ml TID via G-tube or PO -Will meet rest of needs with other liquids/diet and protein supplements 1000 mL 20   potassium chloride 20 MEQ/15ML (10%) SOLN Dilute 15 mls of solution (20 mEq total) as directed and place into feeding tube 2 (two) times daily. 473 mL 2   protein supplement (RESOURCE BENEPROTEIN) 6 g POWD Place 1 Scoop (6 g total) into feeding tube every 3 (three) hours while awake. 1080 g 0   ticagrelor (BRILINTA) 90 MG TABS  tablet Place 1 tablet (90 mg total) into feeding tube 2 (two) times daily. 60 tablet 2   Water For Irrigation, Sterile (FREE WATER) SOLN Place 60 mLs into feeding tube every 8 (eight) hours.     acetaminophen (TYLENOL) 160 MG/5ML solution Place 20.3 mLs (650 mg total) into feeding tube every 6 (six) hours as needed for mild pain, headache or fever. (Patient not taking: No sig reported) 120 mL 0   Hyoscyamine Sulfate SL 0.125 MG SUBL Place 1 tablet (0.125 mg) under the tongue every 6 (six) hours as needed for cramping (Patient not taking: Reported on 04/22/2021) 30 tablet 1   Melatonin 5 MG SUBL Place 5 mg under the tongue at bedtime. (Patient not taking: Reported on 04/22/2021)     Nutritional Supplements (FEEDING SUPPLEMENT, OSMOLITE 1.2 CAL,) LIQD Place 50 mLs  into feeding tube every 2 (two) hours. (Patient not taking: Reported on 04/22/2021)  0   Nutritional Supplements (FEEDING SUPPLEMENT, PROSOURCE TF,) liquid Place 45 mLs into feeding tube 2 (two) times daily. -Begin with 50 ml Osmolite 1.2 via G-tube every 2 hours (8 feedings total while awake) -Free water flush with 30 ml before and after each bolus As tolerated work up to 100 ml Osmolite 1.2 7 times daily (~every 2 hours). -Continue Prosource TF BID. -3 cartons total of Osmolite 1.2 + Prosource TF BID provides 935 kcals (66% of needs), 61g protein (77% of needs), 112g CHO and 1005 ml H2O. -Free water of 240 ml TID via G-tube or PO -Will meet rest of needs with other liquids/diet and protein supplements (Patient not taking: No sig reported) 2700 mL 5   Omeprazole Magnesium 10 MG PACK Take 20 mg by mouth daily. (Patient not taking: Reported on 04/22/2021) 60 each    ondansetron (ZOFRAN-ODT) 4 MG disintegrating tablet Dissolve 1 tablet (4 mg total) by mouth every 6 (six) hours as needed for nausea. (Patient not taking: Reported on 04/22/2021) 40 tablet 0   No current facility-administered medications for this visit.     Allergies:   Other   Social History:  The patient  reports that he quit smoking about 31 years ago. His smoking use included cigarettes. He has a 15.00 pack-year smoking history. He has never used smokeless tobacco. He reports that he does not drink alcohol and does not use drugs.   Family History:   family history includes CAD in his father; Diabetes in his mother and another family member; Heart attack (age of onset: 649) in his father; Heart disease in an other family member; Hypertension in his mother, sister, and another family member.    Review of Systems: Review of Systems  Constitutional: Negative.   Respiratory: Negative.    Cardiovascular: Negative.   Gastrointestinal: Negative.   Musculoskeletal: Negative.   Neurological: Negative.   Psychiatric/Behavioral:  Negative.    All other systems reviewed and are negative.   PHYSICAL EXAM: VS:  BP 100/67 (BP Location: Left Arm, Patient Position: Sitting, Cuff Size: Normal)   Pulse 67   Ht 6\' 2"  (1.88 m)   Wt 259 lb 6 oz (117.7 kg)   SpO2 98%   BMI 33.30 kg/m  , BMI Body mass index is 33.3 kg/m. Constitutional:  oriented to person, place, and time. No distress. Obese HENT:  Head: Normocephalic and atraumatic.  Eyes:  no discharge. No scleral icterus.  Neck: Normal range of motion. Neck supple. No JVD present.  Cardiovascular: Normal rate, regular rhythm, normal heart sounds and intact distal pulses.  Exam reveals no gallop and no friction rub. No edema No murmur heard. Pulmonary/Chest: Effort normal and breath sounds normal. No stridor. No respiratory distress.  no wheezes.  no rales.  no tenderness.  Abdominal: Soft.  no distension.  no tenderness.  Musculoskeletal: Normal range of motion.  no  tenderness or deformity.  Neurological:  normal muscle tone. Coordination normal. No atrophy Skin: Skin is warm and dry. No rash noted. not diaphoretic.  Psychiatric:  normal mood and affect. behavior is normal. Thought content normal.   Recent Labs: 05/22/2020: TSH 1.018 04/14/2021: Magnesium 1.9 04/15/2021: ALT 36 04/16/2021: BUN 5; Creatinine, Ser 0.55; Hemoglobin 13.1; Platelets 175; Potassium 3.4; Sodium 137    Lipid Panel Lab Results  Component Value Date   CHOL 141 04/15/2021   HDL 26 (L) 04/15/2021   LDLCALC 93 04/15/2021   TRIG 109 04/15/2021      Wt Readings from Last 3 Encounters:  04/22/21 259 lb 6 oz (117.7 kg)  04/14/21 265 lb (120.2 kg)  03/27/21 292 lb 5.3 oz (132.6 kg)      ASSESSMENT AND PLAN:  Atrial tachycardia (HCC) - Plan: EKG 12-Lead Currently on diltiazem at nighttime, metoprolol twice daily  Coronary artery disease,  Recent stent placement to his LAD On aspirin Brilinta Not a good candidate at this time to stop the Brilinta despite GI outflow obstruction He  will meet with Dr. Corliss Curry, Hector Curry to determine his options  controlled type 2 diabetes mellitus without complication, without long-term current use of insulin (HCC) He has had dramatic weight loss following gastric bypass surgery  Hypertriglyceridemia Numbers should improve with dramatic weight loss  Elevated LFTs Likely secondary to fatty liver He has lost significant weight as above   Total encounter time more than 25 minutes  Greater than 50% was spent in counseling and coordination of care with the patient   Orders Placed This Encounter  Procedures   EKG 12-Lead    Total encounter time more than 15 minutes  Greater than 50% was spent in counseling and coordination of care with the patient   Signed, Dossie Curry, M.D., Ph.D. 04/22/2021  [facility] [place], [place] [phone]

## 2021-04-19 NOTE — Telephone Encounter (Signed)
Left message on machine to call back  

## 2021-04-22 ENCOUNTER — Ambulatory Visit (INDEPENDENT_AMBULATORY_CARE_PROVIDER_SITE_OTHER): Payer: No Typology Code available for payment source | Admitting: Cardiovascular Disease

## 2021-04-22 ENCOUNTER — Encounter: Payer: Self-pay | Admitting: Cardiovascular Disease

## 2021-04-22 ENCOUNTER — Other Ambulatory Visit (HOSPITAL_COMMUNITY): Payer: Self-pay

## 2021-04-22 ENCOUNTER — Telehealth: Payer: Self-pay | Admitting: Cardiovascular Disease

## 2021-04-22 ENCOUNTER — Other Ambulatory Visit: Payer: Self-pay

## 2021-04-22 VITALS — BP 100/67 | HR 67 | Ht 74.0 in | Wt 259.4 lb

## 2021-04-22 DIAGNOSIS — I25118 Atherosclerotic heart disease of native coronary artery with other forms of angina pectoris: Secondary | ICD-10-CM

## 2021-04-22 DIAGNOSIS — I77810 Thoracic aortic ectasia: Secondary | ICD-10-CM | POA: Diagnosis not present

## 2021-04-22 DIAGNOSIS — R002 Palpitations: Secondary | ICD-10-CM

## 2021-04-22 DIAGNOSIS — Z6841 Body Mass Index (BMI) 40.0 and over, adult: Secondary | ICD-10-CM

## 2021-04-22 DIAGNOSIS — I471 Supraventricular tachycardia: Secondary | ICD-10-CM

## 2021-04-22 DIAGNOSIS — I5032 Chronic diastolic (congestive) heart failure: Secondary | ICD-10-CM | POA: Diagnosis not present

## 2021-04-22 DIAGNOSIS — E782 Mixed hyperlipidemia: Secondary | ICD-10-CM

## 2021-04-22 MED ORDER — LOSARTAN POTASSIUM 25 MG PO TABS
25.0000 mg | ORAL_TABLET | ORAL | 3 refills | Status: DC | PRN
  Filled 2021-04-22: qty 90, 90d supply, fill #0

## 2021-04-22 MED ORDER — ATORVASTATIN CALCIUM 40 MG PO TABS
40.0000 mg | ORAL_TABLET | Freq: Every day | ORAL | 11 refills | Status: DC
  Filled 2021-04-22: qty 30, 30d supply, fill #0

## 2021-04-22 MED ORDER — DILTIAZEM 12 MG/ML ORAL SUSPENSION
60.0000 mg | Freq: Three times a day (TID) | ORAL | 3 refills | Status: DC | PRN
  Filled 2021-04-22: qty 180, 10d supply, fill #0

## 2021-04-22 NOTE — Telephone Encounter (Signed)
I have left messages at all available number including EC.  I have mailed the instructions to the home and sent a message via My Chart.  I will await further communication from the pt. Also waiting on anti coag response.

## 2021-04-22 NOTE — Patient Instructions (Addendum)
Medication Instructions:   Please STOP  HTCZ  Please change the following medications diltiazem to every 8 hrs as needed  losartan to as needed for pressure >130  Brilinta coupon (was given to you in clinic, please register coupon)  If you need a refill on your cardiac medications before your next appointment, please call your pharmacy.    Lab work: Please confirm your order placed for BMP in 1-2 weeks at [facility] (I do not see a pending order in the system) May need to call or have PCP to order labs  Testing/Procedures: No new testing needed   Follow-Up: At [facility], you and your health needs are our priority.  As part of our continuing mission to provide you with exceptional heart care, we have created designated Provider Care Teams.  These Care Teams include your primary Cardiologist (physician) and Advanced Practice Providers (APPs -  Physician Assistants and Nurse Practitioners) who all work together to provide you with the care you need, when you need it.  You will need a follow up appointment in 12 months  Providers on your designated Care Team:   Nicolasa Ducking, NP Eula Listen, PA-C Marisue Ivan, PA-C Cadence [place], [place]  COVID-19 Vaccine Information can be found at: [email or web address] For questions related to vaccine distribution or appointments, please email [email or web address] or call [phone].

## 2021-04-22 NOTE — Telephone Encounter (Signed)
Patient states he is on Brilinta and has a feeding tube and has to crush this pill and add to water. He wants to make sur with his tube he is getting this medication into his body. Please call to discuss.

## 2021-04-23 ENCOUNTER — Other Ambulatory Visit (HOSPITAL_COMMUNITY): Payer: Self-pay

## 2021-04-23 NOTE — Telephone Encounter (Signed)
Brilinta tablets may be crushed and mixed with water to create a suspension for oral or NG use. If administered via NG tube, flush NG tube through with water after administration

## 2021-04-23 NOTE — Telephone Encounter (Signed)
Called to make the patient aware of Melissa, PharmDs response and recommendation. Lmtcb.

## 2021-04-24 ENCOUNTER — Telehealth: Payer: Self-pay | Admitting: Cardiovascular Disease

## 2021-04-24 ENCOUNTER — Other Ambulatory Visit (HOSPITAL_COMMUNITY): Payer: Self-pay

## 2021-04-24 ENCOUNTER — Other Ambulatory Visit: Payer: Self-pay | Admitting: *Deleted

## 2021-04-24 ENCOUNTER — Encounter: Payer: Self-pay | Admitting: *Deleted

## 2021-04-24 ENCOUNTER — Telehealth: Payer: Self-pay

## 2021-04-24 NOTE — Telephone Encounter (Signed)
Nitro is not on patients current medication list.  Please advise.

## 2021-04-24 NOTE — Telephone Encounter (Signed)
*  STAT* If patient is at the pharmacy, call can be transferred to refill team.   1. Which medications need to be refilled? (please list name of each medication and dose if known) Nitroglycerin  2. Which pharmacy/location (including street and city if local  [facility]  3. Do they need a 30 day or 90 day supply?

## 2021-04-24 NOTE — Progress Notes (Signed)
Work note sent to pt as requested to return to work, seen in office 8/29 with Dr. Mariah Milling

## 2021-04-24 NOTE — Telephone Encounter (Signed)
Pt had a heart cath on 04/15/21 by Dr. Allyson Sabal, he was released to go back to work. Pt needs a Dr.s note stating it is okay for patient to return to work.  Note can be emailed to [email or web address]

## 2021-04-24 NOTE — Telephone Encounter (Signed)
Work note completed Okay by Dr. Mariah Milling Uploaded to pt's MyChart in "letters" Sent email to his cone account (advised to not respond, as this is not pt & provider communication).

## 2021-04-24 NOTE — Telephone Encounter (Signed)
Patient is returning the call  

## 2021-04-24 NOTE — Patient Outreach (Signed)
[facility]) Care Management  04/24/2021  RAJENDRA SPILLER 1961-07-22 [id number]   Transition of care call/case closure   Referral received: 04/16/21 ( notification of readmission discharge) .  Initial outreach:04/18/21 Insurance: [place] UMR    Subjective: Initial successful telephone call to patient's preferred number in order to complete transition of care assessment; 2 HIPAA identifiers verified. Explained purpose of call and completed transition of care assessment.  Brantlee states that he is doing better and has returned to work. He reports tolerating tube feeding , he discussed having all medication in liquid form except Aspirin and Brilinta. He discussed follow up with cardiology office/pharmacy regarding brilinta and being okay to crush and mix with water and regarding it being in a liquid form. He reports no further incidents of tube clogging . He reports only taking sips of water. He reports that he has had brief episodes of chest pain, only lasting seconds, discussed with provider. He discussed medication changes after cardiology visit  due to concern of low blood pressure with improved readings. He reports continuing to monitor blood sugar with freestyle libre, he is no longer requiring novolog insulin per sliding scale monitoring blood sugars via freestyle Libre. Discussed low blood sugar management.  He discussed having visit with PCP on tomorrow for further management  Patient wife is assisting in recovery and care a needed.   Reviewed accessing the following [place] Benefits : Patient discussed that he has been enrolled in Active health management as part of preparation recent gastric surgery.  He uses a Insurance risk surveyor outpatient pharmacy at [employer].     Objective:   Mr.Dhanvin Garbutt was  was hospitalized at [facility] 8/ 21-8/23/22 for Chest pain , NSTEMI severe proximal LAD stenosis treated with drug eluting stent, also with a  severe lesion in Diagonal branch  too small for PCI . Comorbidities/PMHx include: CAD, hypertension , obesity ,type 2 diabetes OSA, on Bipap, s/p lap band transition to gastric bypass 02/18/21, with subsequent development of GJ anastomosis stricture and gastrostomy tube placement 03/20/21.  He  was discharged to home on 04/16/21  with resumption of home health services  with [facility]  health services no new durable medical equipment  needs per the discharge summary. Patient has now completed home health services.  Assessment:  Patient voices good understanding of all discharge instructions.  See transition of care flowsheet for assessment details.   Plan:  Reviewed hospital discharge diagnosis of NSTEMI   and discharge treatment plan using hospital discharge instructions, assessing medication adherence, reviewing problems requiring provider notification, and discussing the importance of follow up with surgeon, primary care provider and/or specialists as directed.  Reviewed [place] healthy lifestyle program information to receive discounted premium for  2023   Step 1: Get  your annual physical  Step 2: Complete your health assessment  Step 3:Identify your current health status and complete the corresponding action step between August 25, 2020 and April 25, 2021.    Using Active Health Management ActiveAdvice View website, verified that patient is an active participate in [place]'s Active Health Management chronic disease management program.    No ongoing care management needs identified so will close case to [facility] Care Management services .Thanked patient for their services to [facility].  Egbert Garibaldi, RN, BSN  [facility] Care Management,Care Management Coordinator  [phone]- Mobile [phone]- Toll Free Main Office

## 2021-04-25 ENCOUNTER — Other Ambulatory Visit: Payer: Self-pay | Admitting: *Deleted

## 2021-04-25 ENCOUNTER — Other Ambulatory Visit (HOSPITAL_COMMUNITY): Payer: Self-pay

## 2021-04-25 MED ORDER — NITROGLYCERIN 0.4 MG SL SUBL
0.4000 mg | SUBLINGUAL_TABLET | SUBLINGUAL | 3 refills | Status: DC | PRN
  Filled 2021-04-25: qty 25, 15d supply, fill #0

## 2021-04-25 NOTE — Patient Outreach (Signed)
[facility]) Care Management  04/25/2021  DUSTAN HYAMS October 15, 1960 [id number]   Multidisciplinary case discussion    Patient case was discussed in multidisciplinary case discussion to include:   Patient admitted to [facility] 8/21-8/23 with chest pain, NSTEMI severe proximal LAD stenosis treated with drug eluting stent, also with a severe lesion Diagonal branch likely culprit  too small for PCI . Patient was started on Brilinita continue ASa for one year. He was discharged home with continued [facility] services [facility].   Discussed patient recent transition of care outreach call updated on patient has returned to work , continuing self tube feedings, blood pressure monitoring , he has attended post discharge visit with cardiology and planned PCP and GI and surgeon appointments.  Patient had been enrolled in Active health management with health coach for disease management.   Plan Patient case is closed to [facility] care management services on ongoing care management needs assessed.    Egbert Garibaldi, RN, BSN  [facility] Care Management,Care Management Coordinator  [phone]- Mobile [phone]- Toll Free Main Office

## 2021-04-25 NOTE — Telephone Encounter (Signed)
DPR on file. Spoke with the patients wife Hector Curry. Patient had gastric bypass sx in June and sx was complicated by a gastric outlet obstruction. He than had a g-tube placed that was further complicated by obstruction. Patient has a g-tube in placed and cannot take his medications orally. They did review the instructions from our PharmD with crushing and administering his Brilinta through the pt g-tube.  Hector Curry called to confirm the patients Metoprolol dosage. Pt wife sts that after the patients recent MI and hospital d/c his Metoprolol was listed as 25 mg/10 mL bid on the pt paperwork. Pt was previously being given 2.5 mL and that the patient will run out of the medication sooner due to the change. Advised Hector Curry that a refill is listed in the pt chart and was auth by Foye Spurling, PA on 03/29/21 and sent to [facility] for Metoprolol 25 mg/10 mL #600 mL. Adv Hector Curry that I will call [facility] to get more info.  Called [place] and spoke with [place]. [place] sts that they received a call in auth on 04/03/21 from authorizing provider Hector Chapel, PA for Metoprolol tartrate suspension 10 mg/2.5 mL with patient to be given 25 mg/7.5 mL bid with a years worth of refills.. [place] sts that it may have been compounded differently form how it was sent in. Called the patients wife and made her aware of my conversation with the pharmacy. I asked her to grab the patients bottle and read the signature on the bottle to me. Hector Curry sts that the prescription reads Metoprolol 2.5 mL/25 mg twice a day. Hector Curry sts that she has been giving the patient 1mL twice a day to total 100 mg bid in error since the patients 04/16/21 hospital discharge summary has 10 mL listed. She did not understanding the conversion and was going by the mL instructions instead of the mg.  Explained to [facility] that the intention is for the patient to be given 25 mg bid. The mLs might change based on how the medication is compounded at the pharmacy.  Advised her to pay close attn to both the mgs and mLs before administering. Hector Curry was very appreciation for the explanation. Hector Curry sts that the patients vital signs BP and HR have been at his baseline. Adv Hector Curry to continue to monitor his BP and HR. Adv Hector Curry to check the patients BP and HR this evening. If his VS are at his baseline she should go ahead and administer the correct dosage of Metoprolol 2.5 mL/25 mg. If his BP and HR are below his baseline she should hold  Metoprolol this evening and then resume in the morning. She is to contact the office if his BP and HR are consistently low. <100/50, <55bpm. Adv Hector Curry that Dr. Mariah Milling is ok with the patient having a Rx for sublingual Nitro. Adv Hector Curry it is to be used prn and as directed. She understands when and how to use the Nitro. Hector Curry rqst that the [facility] prescription be sent to [facility] with a message for the medication to be mailed to the patient. Rx sent in as requested.  Hector Curry verbalized understanding to the instructions given and voiced appreciation for the assistance.

## 2021-04-25 NOTE — Telephone Encounter (Signed)
Please call to discuss Metoprolol doseage.

## 2021-04-26 NOTE — Telephone Encounter (Signed)
Nitro SL 0.4 mg tabs sent in 9/1 by Dr. Mariah Milling Updated to pt's med list

## 2021-05-02 ENCOUNTER — Telehealth (HOSPITAL_COMMUNITY): Payer: Self-pay | Admitting: Pharmacist

## 2021-05-02 NOTE — Telephone Encounter (Signed)
Unable to reach patient after 3 attempts

## 2021-05-03 ENCOUNTER — Encounter: Payer: Self-pay | Admitting: Gastroenterology

## 2021-05-03 ENCOUNTER — Ambulatory Visit (INDEPENDENT_AMBULATORY_CARE_PROVIDER_SITE_OTHER): Payer: No Typology Code available for payment source | Admitting: Gastroenterology

## 2021-05-03 ENCOUNTER — Other Ambulatory Visit (INDEPENDENT_AMBULATORY_CARE_PROVIDER_SITE_OTHER): Payer: No Typology Code available for payment source

## 2021-05-03 VITALS — BP 110/66 | HR 84 | Ht 74.0 in | Wt 261.2 lb

## 2021-05-03 DIAGNOSIS — R933 Abnormal findings on diagnostic imaging of other parts of digestive tract: Secondary | ICD-10-CM

## 2021-05-03 DIAGNOSIS — Z9884 Bariatric surgery status: Secondary | ICD-10-CM

## 2021-05-03 DIAGNOSIS — Z7902 Long term (current) use of antithrombotics/antiplatelets: Secondary | ICD-10-CM

## 2021-05-03 DIAGNOSIS — K222 Esophageal obstruction: Secondary | ICD-10-CM | POA: Diagnosis not present

## 2021-05-03 DIAGNOSIS — R1310 Dysphagia, unspecified: Secondary | ICD-10-CM | POA: Diagnosis not present

## 2021-05-03 DIAGNOSIS — R932 Abnormal findings on diagnostic imaging of liver and biliary tract: Secondary | ICD-10-CM

## 2021-05-03 LAB — CBC
HCT: 44.2 % (ref 39.0–52.0)
Hemoglobin: 14.7 g/dL (ref 13.0–17.0)
MCHC: 33.2 g/dL (ref 30.0–36.0)
MCV: 86 fl (ref 78.0–100.0)
Platelets: 195 10*3/uL (ref 150.0–400.0)
RBC: 5.14 Mil/uL (ref 4.22–5.81)
RDW: 14.8 % (ref 11.5–15.5)
WBC: 8.7 10*3/uL (ref 4.0–10.5)

## 2021-05-03 LAB — BASIC METABOLIC PANEL
BUN: 6 mg/dL (ref 6–23)
CO2: 27 mEq/L (ref 19–32)
Calcium: 9.4 mg/dL (ref 8.4–10.5)
Chloride: 102 mEq/L (ref 96–112)
Creatinine, Ser: 0.6 mg/dL (ref 0.40–1.50)
GFR: 106.24 mL/min (ref >60.00)
Glucose, Bld: 148 mg/dL — ABNORMAL HIGH (ref 70–99)
Potassium: 4 mEq/L (ref 3.5–5.1)
Sodium: 137 mEq/L (ref 135–145)

## 2021-05-03 LAB — PROTIME-INR
INR: 1.2 ratio — ABNORMAL HIGH (ref 0.8–1.0)
Prothrombin Time: 13.5 s — ABNORMAL HIGH (ref 9.6–13.1)

## 2021-05-03 NOTE — Progress Notes (Signed)
GASTROENTEROLOGY OUTPATIENT CLINIC VISIT   Primary Care Provider Marguarite Arbour, MD [address] [facility] [place] [address] [phone]  Referring Provider Dr. Sheliah Hatch   Patient Profile: Hector Curry is a 60 y.o. 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  The patient presents to the [facility] for an evaluation and management of problem(s) noted below:  Problem List 1. Esophageal stricture   2. Dysphagia, unspecified type   3. Abnormal barium swallow   4. Antiplatelet or antithrombotic long-term use   5. History of Roux-en-Y gastric bypass   6. Abnormal CT of liver     History of Present Illness This is the patient's first visit to the outpatient [facility].  The patient underwent a Roux-en-Y gastric bypass with laparoscopic band removal in June 2022.  After the patient's discharge she did well for a week or 2 but then began to experience issues of dysphagia.  In July he underwent upper GI series showing evidence of significant narrowing in the region of the previous laparoscopic band.  Unfortunately, the patient had to be hospitalized and eventually have a G-tube placed into his remnant stomach in an effort of allowing adequate nutrition.  Patient has not lost significant weight after his Roux-en-Y gastric bypass.  Unfortunately it as well, he continues to have significant issues with dysphagia.  He is having significant amounts of liquid/saliva that is occurring on a daily basis, for which he needs to carry a spit bag.  He had been scheduled for a clinic visit and  procedure and after a week, had to go back into the hospital.  He was diagnosed with an NSTEMI and underwent cardiac catheterization.  He had a new drug-eluting stent placed in August.  He is now on Brilinta and aspirin in combination.  He comes in with his wife today.  Unfortunately they are frustrated by the symptoms that he is still experiencing as well as not having significant weight loss.  Unfortunately, they know that the Brilinta use is going to complicate things potentially.  Patient does not take any other significant NSAIDs.  His only prior endoscopy was what was performed by Dr. Sheliah Hatch at the time of his procedure.  He states he has never had issues with dysphagia previously.  They are both frustrated by the situation.  GI Review of Systems Positive as above Negative for odynophagia, alteration of bowel habits, melena, hematochezia  Review of Systems General: Denies fevers/chills HEENT: Denies oral lesions/sore throat Cardiovascular: Denies chest pain/palpitations Pulmonary: Denies shortness of breath Gastroenterological: See HPI Genitourinary: Denies darkened urine Hematological: Positive for easy bruising/bleeding due to antiplatelet therapy Dermatological: Denies jaundice Psychological: Mood is anxious to get better   Medications Current Outpatient Medications  Medication Sig Dispense Refill   acetaminophen (TYLENOL) 160 MG/5ML solution Place 20.3 mLs (650 mg total) into feeding tube every 6 (six) hours as needed for mild pain, headache or fever. 120 mL 0   aspirin 81 MG chewable tablet Place 1 tablet (81 mg total) into feeding tube daily. 30 tablet 0   atorvastatin (LIPITOR) 40 MG tablet Place 1 tablet (40 mg total) into feeding tube daily at 6 PM as directed. 30 tablet 11  Continuous Blood Gluc Sensor (FREESTYLE LIBRE 2 SENSOR) MISC Use as directed every 14 days 10 each 1   diltiazem (CARDIZEM) 10 mg/ml SOLN oral suspension Place 6 mLs (60 mg total) into feeding tube  every 8 (eight) hours as needed. 180 mL 3   insulin lispro (HUMALOG) 100 UNIT/ML KwikPen Inject 0-20 Units into the skin 3 (three) times daily with meals. (according to sliding scale) 15 mL 2   Insulin Pen Needle 32G X 4 MM MISC Use as needed as directed 100 each 3   metoprolol tartrate (LOPRESSOR) 25 mg/10 mL SUSP Place 10 mLs (25 mg total) into feeding tube 2 (two) times daily. 600 mL 0   Multiple Vitamins-Minerals (BARIATRIC MULTIVITAMINS/IRON PO) Place 1 tablet into feeding tube daily. With Vit B complex     nitroGLYCERIN (NITROSTAT) 0.4 MG SL tablet Place 1 tablet (0.4 mg total) under the tongue every 5 (five) minutes as needed for chest pain. 25 tablet 3   Nutritional Supplements (FEEDING SUPPLEMENT, OSMOLITE 1.2 CAL,) LIQD Place 1,000 mLs into feeding tube continuous. -Begin with 50 ml Osmolite 1.2 via G-tube every 2 hours (8 feedings total while awake) -Free water flush with 30 ml before and after each bolus As tolerated work up to 100 ml Osmolite 1.2 7 times daily (~every 2 hours). -Continue Prosource TF BID. -3 cartons total of Osmolite 1.2 + Prosource TF BID provides 935 kcals (66% of needs), 61g protein (77% of needs), 112g CHO and 1005 ml H2O. -Free water of 240 ml TID via G-tube or PO -Will meet rest of needs with other liquids/diet and protein supplements 1000 mL 20   Omeprazole Magnesium 10 MG PACK Take 20 mg by mouth daily. (Patient taking differently: Take 20 mg by mouth in the morning and at bedtime.) 60 each    potassium chloride 20 MEQ/15ML (10%) SOLN Dilute 15 mls of solution (20 mEq total) as directed and place into feeding tube 2 (two) times daily. 473 mL 2   protein supplement (RESOURCE BENEPROTEIN) POWD Take 1 Scoop by mouth in the morning and at bedtime.     ticagrelor (BRILINTA) 90 MG TABS tablet Place 1 tablet (90 mg total) into feeding tube 2 (two) times daily. 60 tablet 2   Water For Irrigation, Sterile (FREE WATER) SOLN Place 60 mLs into feeding tube every 8 (eight)  hours.     No current facility-administered medications for this visit.    Allergies Allergies  Allergen Reactions   Other Other (See Comments)    Cats    Histories Past Medical History:  Diagnosis Date   Allergic rhinitis    Atrial fibrillation (HCC)    Coronary artery disease, non-occlusive    a. 05/08/2015 Cath: no significant CAD, LVEF nl-->Med; b. 07/2017 MV: attenuation artifact, no ischemia, EF 65%-->Low risk.   Diabetes mellitus without complication (HCC)    Diastolic dysfunction    a. 04/2015 Echo: EF 60-65%, mild MR, mildly dil LA, nl RV fxn, nl PASP; b. 05/2016 Echo: EF 60-65%, no rmwa, Gr1 DD, mildly dil Ao root and asc Ao; c. 07/2017 Echo: EF 55-60%, Ao root 42mm. mildly dil LA; d. 03/2019 Echo: EF 55-60%, nl RV fxn. Mild LAE, Ao root/Asc Ao 42mm.   Dilated aortic root (HCC)    a. 07/2017 Echo: 42mm Ao root - mildly dil; b. 03/2019 Echo: Ao root 42mm.   Diverticulosis 10/03/2014   Dysrhythmia    Family history of premature CAD    a. father passed from MI at 4949  GERD (gastroesophageal reflux disease)    Hemorrhoids    a. internal hemorrhoids s/p surgery 1999   History of kidney stones    History of tobacco abuse    Hyperlipidemia    Hyperplastic colon polyp 10/03/2014   a. x 2    Hypertension    Inflammatory arthritis    a. CCP antibodies & x-rays negative. Rheumatoid factor 14, felt to be crystaline over RA or psoriatic   Morbid obesity (HCC)    a. s/p LAP-BAND   OSA (obstructive sleep apnea)    a. on CPAP   Osteoarthritis    PSVT (paroxysmal supraventricular tachycardia) (HCC)    a. 48 hr Holter 04/2015: NSR w/ rare PVC, short runs of narrow complex tachycardiac, possible atrial tach, longest run 7 beats, PACs noted (2% of all beats 3600 total) they did not seem to correlate w/ significant arrythmia; b. 08/2017 Event monitor: no significant arrhythmias.   Past Surgical History:  Procedure Laterality Date   BARIATRIC SURGERY     lap band    CARDIAC  CATHETERIZATION N/A 05/08/2015   Procedure: Left Heart Cath and Coronary Angiography;  Surgeon: Corky Crafts, MD;  Location: [facility] INVASIVE CV LAB;  Service: Cardiovascular;  Laterality: N/A;   CARPAL TUNNEL RELEASE Left    CHOLECYSTECTOMY     COLONOSCOPY  06/27/2005   COLONOSCOPY  10/03/2014   CORONARY STENT INTERVENTION N/A 04/15/2021   Procedure: CORONARY STENT INTERVENTION;  Surgeon: Runell Gess, MD;  Location: [facility] INVASIVE CV LAB;  Service: Cardiovascular;  Laterality: N/A;   ESOPHAGOGASTRODUODENOSCOPY  06/27/2005   ESOPHAGOGASTRODUODENOSCOPY N/A 03/19/2021   Procedure: ESOPHAGOGASTRODUODENOSCOPY (EGD);  Surgeon: Sheliah Hatch, De Blanch, MD;  Location: [facility];  Service: General;  Laterality: N/A;   GASTRIC ROUX-EN-Y N/A 02/18/2021   Procedure: LAPAROSCOPIC ROUX-EN-Y GASTRIC BYPASS WITH UPPER ENDOSCOPY,;  Surgeon: Sheliah Hatch De Blanch, MD;  Location: [facility] ORS;  Service: General;  Laterality: N/A;   HEMORRHOID SURGERY  08/25/1997   IR GJ TUBE CHANGE  03/27/2021   LAPAROSCOPIC INSERTION GASTROSTOMY TUBE N/A 03/20/2021   Procedure: LAPAROSCOPIC INSERTION GASTROSTOMY TUBE;  Surgeon: Rodman Pickle, MD;  Location: [facility] ORS;  Service: General;  Laterality: N/A;   LEFT HEART CATH AND CORONARY ANGIOGRAPHY N/A 04/15/2021   Procedure: LEFT HEART CATH AND CORONARY ANGIOGRAPHY;  Surgeon: Runell Gess, MD;  Location: [facility] INVASIVE CV LAB;  Service: Cardiovascular;  Laterality: N/A;   MOUTH SURGERY     removed area which was benign   TONSILLECTOMY     UPPER GI ENDOSCOPY N/A 03/20/2021   Procedure: UPPER GI ENDOSCOPY;  Surgeon: Kinsinger, De Blanch, MD;  Location: [facility] ORS;  Service: General;  Laterality: N/A;   Social History   Socioeconomic History   Marital status: Married    Spouse name: Not on file   Number of children: Not on file   Years of education: Not on file   Highest education level: Not on file  Occupational History   Not on file  Tobacco Use   Smoking status: Former     Packs/day: 1.00    Years: 15.00    Pack years: 15.00    Types: Cigarettes    Quit date: 08/05/1989    Years since quitting: 31.7   Smokeless tobacco: Never  Vaping Use   Vaping Use: Never used  Substance and Sexual Activity   Alcohol use: Never   Drug use: Never   Sexual activity: Not on file  Other Topics Concern   Not on file  Social  History Narrative   Not on file   Social Determinants of Health   Financial Resource Strain: Not on file  Food Insecurity: Not on file  Transportation Needs: Not on file  Physical Activity: Not on file  Stress: Not on file  Social Connections: Not on file  Intimate Partner Violence: Not on file   Family History  Problem Relation Age of Onset   Hypertension Mother    Diabetes Mother    Heart attack Father 9   CAD Father    Hypertension Sister    Diabetes Other    Hypertension Other    Heart disease Other    Colon cancer Neg Hx    Esophageal cancer Neg Hx    Pancreatic cancer Neg Hx    Stomach cancer Neg Hx    Liver disease Neg Hx    Inflammatory bowel disease Neg Hx    Rectal cancer Neg Hx    I have reviewed his medical, social, and family history in detail and updated the electronic medical record as necessary.    PHYSICAL EXAMINATION  BP 110/66   Pulse 84   Ht 6\' 2"  (1.88 m)   Wt 261 lb 3.2 oz (118.5 kg)   BMI 33.54 kg/m  Wt Readings from Last 3 Encounters:  05/03/21 261 lb 3.2 oz (118.5 kg)  04/22/21 259 lb 6 oz (117.7 kg)  04/14/21 265 lb (120.2 kg)  GEN: NAD, appears stated age, doesn't appear chronically ill, accompanied by wife, with spit bag passing saliva every few minutes PSYCH: Cooperative, without pressured speech EYE: Conjunctivae pink, sclerae anicteric ENT: MMM, without oral ulcers CV: RR without R/Gs  RESP: No wheezing GI: NABS, soft, protuberant abdomen, surgical scars present, G-tube in place, NT, without rebound MSK/EXT: Lower extremity edema present SKIN: No jaundice NEURO:  Alert & Oriented x  3, no focal deficits   REVIEW OF DATA  I reviewed the following data at the time of this encounter:  GI Procedures and Studies  July 2022 operative note EGD Endoscopy was advanced through the oral pharynx and esophagus without difficulty. The GE junction was identified there was moderate granulation tissue or redundant tissue in the area. Multiple attempts were made to pass the area without success. A peds endoscope was attempted to pass through without success. The procedure was terminated at that time  June 2022 operative note EGD The assistant then went and performed an upper endoscopy and leak test. At first bubbles were seen in the anterior distal pouch which was repaired with 3-0 vicryl. Next, no bubbles were seen and the pouch and limb distended appropriately. The limb and pouch were deflated, the endoscope was removed.   Laboratory Studies  Reviewed those in epic and care everywhere  Imaging Studies  April 2022 upper GI series FINDINGS: Small hiatal hernia above the lap band device. Spontaneous gastroesophageal reflux noted during the procedure.  July 2022 CT abdomen pelvis with contrast IMPRESSION: 1. No evidence of bowel obstruction. 2. Low-attenuation lesion in the anterior abdominal wall musculature, as above. This is favored to represent a resolving spontaneous intramuscular hemorrhage within the right rectus abdominus muscles. There is slight haziness in the surrounding fat which could indicate inflammation, but there is no overt thick rim of enhancement to clearly indicate an abscess at this time. Further clinical evaluation is recommended. 3. Aortic atherosclerosis. 4. Additional incidental findings, as above.  July 2022 upper GI series IMPRESSION: 1. There is significant narrowing at the site of the previous lap band. This  narrowing could be due to edema or stricture. Neoplasm considered less likely. Recommend direct visualization for better evaluation. 2. The  remainder of the study is normal. The new gastric pouch is normal and empties normally into the jejunum.  August 2022 CT chest IMPRESSION: 1. Postoperative findings of Roux-en-Y type gastric bypass with additional percutaneous gastrostomy of the gastric remnant. The gastric remnant is fluid-filled although not distended. 2. No evidence of bowel obstruction or other surgical complication. 3. Somewhat coarse, nodular contour of the liver, suggestive of cirrhosis. 4. Background of very fine centrilobular nodules throughout the lungs, most concentrated in the apices, most consistent with smoking-related respiratory bronchiolitis. 5. Minimal residua of a prior right rectus sheath hematoma. 6. Coronary artery disease.   ASSESSMENT  Mr. Rajewski is a 60 y.o. 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  The patient is seen today for evaluation and management of:  1. Esophageal stricture   2. Dysphagia, unspecified type   3. Abnormal barium swallow   4. Antiplatelet or antithrombotic long-term use   5. History of Roux-en-Y gastric bypass   6. Abnormal CT of liver    The patient is hemodynamically stable.  Clinically however, he is continuing to experience significant dysphagia.  Unfortunately, things have become much more complex as result of his recent NSTEMI and drug-eluting stent placement.  Typically, esophageal/gastric dilation would be attempted but now this is going to be a much higher risk due to the inability to come off of Brilinta.  I have looked at the imaging from earlier this summer and there is a possibility that this could  be a shorter narrowing such that novel lumen apposing metal stent placement with AXIOS could be possible.  I need to better define the length of this narrowing to see if this is possible or not.  I am going to think a little bit more about potentially repeating an upper GI series in the next couple of weeks versus just moving forward with our planned EGD with fluoroscopy.  This is a much higher risk procedure in regards to being unable to come off of Brilinta, but I am not sure that the patient can wait a full 6 months with his current symptoms before being able to come off Brilinta for a procedure.  The risks and benefits of endoscopic evaluation were discussed with the patient; these include but are not limited to the risk of perforation, infection, bleeding, missed lesions, lack of diagnosis, severe illness requiring hospitalization, as well as anesthesia and sedation related illnesses.  The patient and/or family is agreeable to proceed understanding the higher risk nature of the procedure.  They are willing to accept these risks.  The procedure will be done in the hospital-based outpatient setting so the patient needed to be admitted to the hospital they could.  I am not sure that the patient has underlying cirrhosis as is being mentioned in the CT report, he has no loss of attic function of the liver as well as normal platelets and normal liver enzymes but that is something that should be evaluated/followed in the future.  Time will tell if I can help the patient or feel need to wait further or potentially be evaluated at a quaternary center,  in the setting of being unable to come off Brilinta therapy for quite a while.  I will forward this to the patient's surgeon, as I am not sure he is aware of the changes in the last few weeks.  All patient questions were answered to the best of my ability, and the patient agrees to the aforementioned plan of action with follow-up as indicated.   PLAN  Preprocedure labs  to be obtained Proceed with scheduled EGD with fluoroscopy and potential stenting Will consider potential repeat upper GI series to reevaluate narrowing prior to procedure Will forward note to patient's cardiologist, though he will be unable to come off Brilinta therapy for his procedure Will forward note to patient's surgeon   Orders Placed This Encounter  Procedures   CBC   Basic Metabolic Panel (BMET)   INR/PT    New Prescriptions   No medications on file   Modified Medications   No medications on file    Planned Follow Up No follow-ups on file.   Total Time in Face-to-Face and in Coordination of Care for patient including independent/personal interpretation/review of prior testing, medical history, examination, medication adjustment, communicating results with the patient directly, and documentation with the EHR is 45 minutes.   Corliss Parish, MD [facility] Advanced [facility]oscopy Office # [phone]

## 2021-05-03 NOTE — Patient Instructions (Signed)
Stay on your Brilianta and Aspirin for your procedure.   Your provider has requested that you go to the basement level for lab work before leaving today. Press "B" on the elevator. The lab is located at the first door on the left as you exit the elevator.  You have been scheduled for an endoscopy. Please follow written instructions given to you at your visit today. If you use inhalers (even only as needed), please bring them with you on the day of your procedure.  Due to recent changes in healthcare laws, you may see the results of your imaging and laboratory studies on MyChart before your provider has had a chance to review them.  We understand that in some cases there may be results that are confusing or concerning to you. Not all laboratory results come back in the same time frame and the provider may be waiting for multiple results in order to interpret others.  Please give [place] 48 hours in order for your provider to thoroughly review all the results before contacting the office for clarification of your results.   Thank you for choosing me and [facility].  Dr. Meridee Score

## 2021-05-03 NOTE — H&P (View-Only) (Signed)
GASTROENTEROLOGY OUTPATIENT CLINIC VISIT   Primary Care Provider Marguarite Arbour, MD [address] [facility] [place] [address] [phone]  Referring Provider Dr. Sheliah Hatch   Patient Profile: Hector Curry is a 60 y.o. 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  The patient presents to the [facility] for an evaluation and management of problem(s) noted below:  Problem List 1. Esophageal stricture   2. Dysphagia, unspecified type   3. Abnormal barium swallow   4. Antiplatelet or antithrombotic long-term use   5. History of Roux-en-Y gastric bypass   6. Abnormal CT of liver     History of Present Illness This is the patient's first visit to the outpatient [facility].  The patient underwent a Roux-en-Y gastric bypass with laparoscopic band removal in June 2022.  After the patient's discharge she did well for a week or 2 but then began to experience issues of dysphagia.  In July he underwent upper GI series showing evidence of significant narrowing in the region of the previous laparoscopic band.  Unfortunately, the patient had to be hospitalized and eventually have a G-tube placed into his remnant stomach in an effort of allowing adequate nutrition.  Patient has not lost significant weight after his Roux-en-Y gastric bypass.  Unfortunately it as well, he continues to have significant issues with dysphagia.  He is having significant amounts of liquid/saliva that is occurring on a daily basis, for which he needs to carry a spit bag.  He had been scheduled for a clinic visit and  procedure and after a week, had to go back into the hospital.  He was diagnosed with an NSTEMI and underwent cardiac catheterization.  He had a new drug-eluting stent placed in August.  He is now on Brilinta and aspirin in combination.  He comes in with his wife today.  Unfortunately they are frustrated by the symptoms that he is still experiencing as well as not having significant weight loss.  Unfortunately, they know that the Brilinta use is going to complicate things potentially.  Patient does not take any other significant NSAIDs.  His only prior endoscopy was what was performed by Dr. Sheliah Hatch at the time of his procedure.  He states he has never had issues with dysphagia previously.  They are both frustrated by the situation.  GI Review of Systems Positive as above Negative for odynophagia, alteration of bowel habits, melena, hematochezia  Review of Systems General: Denies fevers/chills HEENT: Denies oral lesions/sore throat Cardiovascular: Denies chest pain/palpitations Pulmonary: Denies shortness of breath Gastroenterological: See HPI Genitourinary: Denies darkened urine Hematological: Positive for easy bruising/bleeding due to antiplatelet therapy Dermatological: Denies jaundice Psychological: Mood is anxious to get better   Medications Current Outpatient Medications  Medication Sig Dispense Refill   acetaminophen (TYLENOL) 160 MG/5ML solution Place 20.3 mLs (650 mg total) into feeding tube every 6 (six) hours as needed for mild pain, headache or fever. 120 mL 0   aspirin 81 MG chewable tablet Place 1 tablet (81 mg total) into feeding tube daily. 30 tablet 0   atorvastatin (LIPITOR) 40 MG tablet Place 1 tablet (40 mg total) into feeding tube daily at 6 PM as directed. 30 tablet 11  Continuous Blood Gluc Sensor (FREESTYLE LIBRE 2 SENSOR) MISC Use as directed every 14 days 10 each 1   diltiazem (CARDIZEM) 10 mg/ml SOLN oral suspension Place 6 mLs (60 mg total) into feeding tube  every 8 (eight) hours as needed. 180 mL 3   insulin lispro (HUMALOG) 100 UNIT/ML KwikPen Inject 0-20 Units into the skin 3 (three) times daily with meals. (according to sliding scale) 15 mL 2   Insulin Pen Needle 32G X 4 MM MISC Use as needed as directed 100 each 3   metoprolol tartrate (LOPRESSOR) 25 mg/10 mL SUSP Place 10 mLs (25 mg total) into feeding tube 2 (two) times daily. 600 mL 0   Multiple Vitamins-Minerals (BARIATRIC MULTIVITAMINS/IRON PO) Place 1 tablet into feeding tube daily. With Vit B complex     nitroGLYCERIN (NITROSTAT) 0.4 MG SL tablet Place 1 tablet (0.4 mg total) under the tongue every 5 (five) minutes as needed for chest pain. 25 tablet 3   Nutritional Supplements (FEEDING SUPPLEMENT, OSMOLITE 1.2 CAL,) LIQD Place 1,000 mLs into feeding tube continuous. -Begin with 50 ml Osmolite 1.2 via G-tube every 2 hours (8 feedings total while awake) -Free water flush with 30 ml before and after each bolus As tolerated work up to 100 ml Osmolite 1.2 7 times daily (~every 2 hours). -Continue Prosource TF BID. -3 cartons total of Osmolite 1.2 + Prosource TF BID provides 935 kcals (66% of needs), 61g protein (77% of needs), 112g CHO and 1005 ml H2O. -Free water of 240 ml TID via G-tube or PO -Will meet rest of needs with other liquids/diet and protein supplements 1000 mL 20   Omeprazole Magnesium 10 MG PACK Take 20 mg by mouth daily. (Patient taking differently: Take 20 mg by mouth in the morning and at bedtime.) 60 each    potassium chloride 20 MEQ/15ML (10%) SOLN Dilute 15 mls of solution (20 mEq total) as directed and place into feeding tube 2 (two) times daily. 473 mL 2   protein supplement (RESOURCE BENEPROTEIN) POWD Take 1 Scoop by mouth in the morning and at bedtime.     ticagrelor (BRILINTA) 90 MG TABS tablet Place 1 tablet (90 mg total) into feeding tube 2 (two) times daily. 60 tablet 2   Water For Irrigation, Sterile (FREE WATER) SOLN Place 60 mLs into feeding tube every 8 (eight)  hours.     No current facility-administered medications for this visit.    Allergies Allergies  Allergen Reactions   Other Other (See Comments)    Cats    Histories Past Medical History:  Diagnosis Date   Allergic rhinitis    Atrial fibrillation (HCC)    Coronary artery disease, non-occlusive    a. 05/08/2015 Cath: no significant CAD, LVEF nl-->Med; b. 07/2017 MV: attenuation artifact, no ischemia, EF 65%-->Low risk.   Diabetes mellitus without complication (HCC)    Diastolic dysfunction    a. 04/2015 Echo: EF 60-65%, mild MR, mildly dil LA, nl RV fxn, nl PASP; b. 05/2016 Echo: EF 60-65%, no rmwa, Gr1 DD, mildly dil Ao root and asc Ao; c. 07/2017 Echo: EF 55-60%, Ao root 42mm. mildly dil LA; d. 03/2019 Echo: EF 55-60%, nl RV fxn. Mild LAE, Ao root/Asc Ao 42mm.   Dilated aortic root (HCC)    a. 07/2017 Echo: 42mm Ao root - mildly dil; b. 03/2019 Echo: Ao root 42mm.   Diverticulosis 10/03/2014   Dysrhythmia    Family history of premature CAD    a. father passed from MI at 4949  GERD (gastroesophageal reflux disease)    Hemorrhoids    a. internal hemorrhoids s/p surgery 1999   History of kidney stones    History of tobacco abuse    Hyperlipidemia    Hyperplastic colon polyp 10/03/2014   a. x 2    Hypertension    Inflammatory arthritis    a. CCP antibodies & x-rays negative. Rheumatoid factor 14, felt to be crystaline over RA or psoriatic   Morbid obesity (HCC)    a. s/p LAP-BAND   OSA (obstructive sleep apnea)    a. on CPAP   Osteoarthritis    PSVT (paroxysmal supraventricular tachycardia) (HCC)    a. 48 hr Holter 04/2015: NSR w/ rare PVC, short runs of narrow complex tachycardiac, possible atrial tach, longest run 7 beats, PACs noted (2% of all beats 3600 total) they did not seem to correlate w/ significant arrythmia; b. 08/2017 Event monitor: no significant arrhythmias.   Past Surgical History:  Procedure Laterality Date   BARIATRIC SURGERY     lap band    CARDIAC  CATHETERIZATION N/A 05/08/2015   Procedure: Left Heart Cath and Coronary Angiography;  Surgeon: Corky Crafts, MD;  Location: [facility] INVASIVE CV LAB;  Service: Cardiovascular;  Laterality: N/A;   CARPAL TUNNEL RELEASE Left    CHOLECYSTECTOMY     COLONOSCOPY  06/27/2005   COLONOSCOPY  10/03/2014   CORONARY STENT INTERVENTION N/A 04/15/2021   Procedure: CORONARY STENT INTERVENTION;  Surgeon: Runell Gess, MD;  Location: [facility] INVASIVE CV LAB;  Service: Cardiovascular;  Laterality: N/A;   ESOPHAGOGASTRODUODENOSCOPY  06/27/2005   ESOPHAGOGASTRODUODENOSCOPY N/A 03/19/2021   Procedure: ESOPHAGOGASTRODUODENOSCOPY (EGD);  Surgeon: Sheliah Hatch, De Blanch, MD;  Location: [facility];  Service: General;  Laterality: N/A;   GASTRIC ROUX-EN-Y N/A 02/18/2021   Procedure: LAPAROSCOPIC ROUX-EN-Y GASTRIC BYPASS WITH UPPER ENDOSCOPY,;  Surgeon: Sheliah Hatch De Blanch, MD;  Location: [facility] ORS;  Service: General;  Laterality: N/A;   HEMORRHOID SURGERY  08/25/1997   IR GJ TUBE CHANGE  03/27/2021   LAPAROSCOPIC INSERTION GASTROSTOMY TUBE N/A 03/20/2021   Procedure: LAPAROSCOPIC INSERTION GASTROSTOMY TUBE;  Surgeon: Rodman Pickle, MD;  Location: [facility] ORS;  Service: General;  Laterality: N/A;   LEFT HEART CATH AND CORONARY ANGIOGRAPHY N/A 04/15/2021   Procedure: LEFT HEART CATH AND CORONARY ANGIOGRAPHY;  Surgeon: Runell Gess, MD;  Location: [facility] INVASIVE CV LAB;  Service: Cardiovascular;  Laterality: N/A;   MOUTH SURGERY     removed area which was benign   TONSILLECTOMY     UPPER GI ENDOSCOPY N/A 03/20/2021   Procedure: UPPER GI ENDOSCOPY;  Surgeon: Kinsinger, De Blanch, MD;  Location: [facility] ORS;  Service: General;  Laterality: N/A;   Social History   Socioeconomic History   Marital status: Married    Spouse name: Not on file   Number of children: Not on file   Years of education: Not on file   Highest education level: Not on file  Occupational History   Not on file  Tobacco Use   Smoking status: Former     Packs/day: 1.00    Years: 15.00    Pack years: 15.00    Types: Cigarettes    Quit date: 08/05/1989    Years since quitting: 31.7   Smokeless tobacco: Never  Vaping Use   Vaping Use: Never used  Substance and Sexual Activity   Alcohol use: Never   Drug use: Never   Sexual activity: Not on file  Other Topics Concern   Not on file  Social  History Narrative   Not on file   Social Determinants of Health   Financial Resource Strain: Not on file  Food Insecurity: Not on file  Transportation Needs: Not on file  Physical Activity: Not on file  Stress: Not on file  Social Connections: Not on file  Intimate Partner Violence: Not on file   Family History  Problem Relation Age of Onset   Hypertension Mother    Diabetes Mother    Heart attack Father 9   CAD Father    Hypertension Sister    Diabetes Other    Hypertension Other    Heart disease Other    Colon cancer Neg Hx    Esophageal cancer Neg Hx    Pancreatic cancer Neg Hx    Stomach cancer Neg Hx    Liver disease Neg Hx    Inflammatory bowel disease Neg Hx    Rectal cancer Neg Hx    I have reviewed his medical, social, and family history in detail and updated the electronic medical record as necessary.    PHYSICAL EXAMINATION  BP 110/66   Pulse 84   Ht 6\' 2"  (1.88 m)   Wt 261 lb 3.2 oz (118.5 kg)   BMI 33.54 kg/m  Wt Readings from Last 3 Encounters:  05/03/21 261 lb 3.2 oz (118.5 kg)  04/22/21 259 lb 6 oz (117.7 kg)  04/14/21 265 lb (120.2 kg)  GEN: NAD, appears stated age, doesn't appear chronically ill, accompanied by wife, with spit bag passing saliva every few minutes PSYCH: Cooperative, without pressured speech EYE: Conjunctivae pink, sclerae anicteric ENT: MMM, without oral ulcers CV: RR without R/Gs  RESP: No wheezing GI: NABS, soft, protuberant abdomen, surgical scars present, G-tube in place, NT, without rebound MSK/EXT: Lower extremity edema present SKIN: No jaundice NEURO:  Alert & Oriented x  3, no focal deficits   REVIEW OF DATA  I reviewed the following data at the time of this encounter:  GI Procedures and Studies  July 2022 operative note EGD Endoscopy was advanced through the oral pharynx and esophagus without difficulty. The GE junction was identified there was moderate granulation tissue or redundant tissue in the area. Multiple attempts were made to pass the area without success. A peds endoscope was attempted to pass through without success. The procedure was terminated at that time  June 2022 operative note EGD The assistant then went and performed an upper endoscopy and leak test. At first bubbles were seen in the anterior distal pouch which was repaired with 3-0 vicryl. Next, no bubbles were seen and the pouch and limb distended appropriately. The limb and pouch were deflated, the endoscope was removed.   Laboratory Studies  Reviewed those in epic and care everywhere  Imaging Studies  April 2022 upper GI series FINDINGS: Small hiatal hernia above the lap band device. Spontaneous gastroesophageal reflux noted during the procedure.  July 2022 CT abdomen pelvis with contrast IMPRESSION: 1. No evidence of bowel obstruction. 2. Low-attenuation lesion in the anterior abdominal wall musculature, as above. This is favored to represent a resolving spontaneous intramuscular hemorrhage within the right rectus abdominus muscles. There is slight haziness in the surrounding fat which could indicate inflammation, but there is no overt thick rim of enhancement to clearly indicate an abscess at this time. Further clinical evaluation is recommended. 3. Aortic atherosclerosis. 4. Additional incidental findings, as above.  July 2022 upper GI series IMPRESSION: 1. There is significant narrowing at the site of the previous lap band. This  narrowing could be due to edema or stricture. Neoplasm considered less likely. Recommend direct visualization for better evaluation. 2. The  remainder of the study is normal. The new gastric pouch is normal and empties normally into the jejunum.  August 2022 CT chest IMPRESSION: 1. Postoperative findings of Roux-en-Y type gastric bypass with additional percutaneous gastrostomy of the gastric remnant. The gastric remnant is fluid-filled although not distended. 2. No evidence of bowel obstruction or other surgical complication. 3. Somewhat coarse, nodular contour of the liver, suggestive of cirrhosis. 4. Background of very fine centrilobular nodules throughout the lungs, most concentrated in the apices, most consistent with smoking-related respiratory bronchiolitis. 5. Minimal residua of a prior right rectus sheath hematoma. 6. Coronary artery disease.   ASSESSMENT  Mr. Cuadras is a 59 y.o. 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  The patient is seen today for evaluation and management of:  1. Esophageal stricture   2. Dysphagia, unspecified type   3. Abnormal barium swallow   4. Antiplatelet or antithrombotic long-term use   5. History of Roux-en-Y gastric bypass   6. Abnormal CT of liver    The patient is hemodynamically stable.  Clinically however, he is continuing to experience significant dysphagia.  Unfortunately, things have become much more complex as result of his recent NSTEMI and drug-eluting stent placement.  Typically, esophageal/gastric dilation would be attempted but now this is going to be a much higher risk due to the inability to come off of Brilinta.  I have looked at the imaging from earlier this summer and there is a possibility that this could  be a shorter narrowing such that novel lumen apposing metal stent placement with AXIOS could be possible.  I need to better define the length of this narrowing to see if this is possible or not.  I am going to think a little bit more about potentially repeating an upper GI series in the next couple of weeks versus just moving forward with our planned EGD with fluoroscopy.  This is a much higher risk procedure in regards to being unable to come off of Brilinta, but I am not sure that the patient can wait a full 6 months with his current symptoms before being able to come off Brilinta for a procedure.  The risks and benefits of endoscopic evaluation were discussed with the patient; these include but are not limited to the risk of perforation, infection, bleeding, missed lesions, lack of diagnosis, severe illness requiring hospitalization, as well as anesthesia and sedation related illnesses.  The patient and/or family is agreeable to proceed understanding the higher risk nature of the procedure.  They are willing to accept these risks.  The procedure will be done in the hospital-based outpatient setting so the patient needed to be admitted to the hospital they could.  I am not sure that the patient has underlying cirrhosis as is being mentioned in the CT report, he has no loss of attic function of the liver as well as normal platelets and normal liver enzymes but that is something that should be evaluated/followed in the future.  Time will tell if I can help the patient or feel need to wait further or potentially be evaluated at a quaternary center,   in the setting of being unable to come off Brilinta therapy for quite a while.  I will forward this to the patient's surgeon, as I am not sure he is aware of the changes in the last few weeks.  All patient questions were answered to the best of my ability, and the patient agrees to the aforementioned plan of action with follow-up as indicated.   PLAN  Preprocedure labs  to be obtained Proceed with scheduled EGD with fluoroscopy and potential stenting Will consider potential repeat upper GI series to reevaluate narrowing prior to procedure Will forward note to patient's cardiologist, though he will be unable to come off Brilinta therapy for his procedure Will forward note to patient's surgeon   Orders Placed This Encounter  Procedures   CBC   Basic Metabolic Panel (BMET)   INR/PT    New Prescriptions   No medications on file   Modified Medications   No medications on file    Planned Follow Up No follow-ups on file.   Total Time in Face-to-Face and in Coordination of Care for patient including independent/personal interpretation/review of prior testing, medical history, examination, medication adjustment, communicating results with the patient directly, and documentation with the EHR is 45 minutes.   Corliss Parish, MD [facility] Office # [phone]

## 2021-05-05 ENCOUNTER — Encounter: Payer: Self-pay | Admitting: Gastroenterology

## 2021-05-05 DIAGNOSIS — Z7902 Long term (current) use of antithrombotics/antiplatelets: Secondary | ICD-10-CM | POA: Insufficient documentation

## 2021-05-05 DIAGNOSIS — R932 Abnormal findings on diagnostic imaging of liver and biliary tract: Secondary | ICD-10-CM | POA: Insufficient documentation

## 2021-05-05 DIAGNOSIS — R1310 Dysphagia, unspecified: Secondary | ICD-10-CM | POA: Insufficient documentation

## 2021-05-05 DIAGNOSIS — R933 Abnormal findings on diagnostic imaging of other parts of digestive tract: Secondary | ICD-10-CM | POA: Insufficient documentation

## 2021-05-05 DIAGNOSIS — K222 Esophageal obstruction: Secondary | ICD-10-CM | POA: Insufficient documentation

## 2021-05-09 ENCOUNTER — Other Ambulatory Visit (HOSPITAL_COMMUNITY): Payer: Self-pay

## 2021-05-09 MED ORDER — CETIRIZINE HCL 1 MG/ML PO SOLN
ORAL | 11 refills | Status: DC
  Filled 2021-05-09: qty 236, 23d supply, fill #0

## 2021-05-10 ENCOUNTER — Other Ambulatory Visit (HOSPITAL_COMMUNITY): Payer: Self-pay

## 2021-05-11 ENCOUNTER — Other Ambulatory Visit (HOSPITAL_COMMUNITY): Payer: Self-pay

## 2021-05-13 ENCOUNTER — Other Ambulatory Visit (HOSPITAL_COMMUNITY): Payer: Self-pay

## 2021-05-14 ENCOUNTER — Other Ambulatory Visit (HOSPITAL_COMMUNITY): Payer: Self-pay

## 2021-05-16 NOTE — Progress Notes (Signed)
Attempted to obtain medical history via telephone, unable to reach at this time. I left a voicemail to return pre surgical testing department's phone call.  

## 2021-05-20 ENCOUNTER — Other Ambulatory Visit (HOSPITAL_COMMUNITY): Payer: Self-pay

## 2021-05-20 MED ORDER — FIRST - METOPROLOL 10 MG/ML PO SOLN: ORAL | 3 refills | Status: DC

## 2021-05-21 ENCOUNTER — Other Ambulatory Visit (HOSPITAL_COMMUNITY): Payer: Self-pay

## 2021-05-21 NOTE — Telephone Encounter (Signed)
I have received a response from Dr Jerral Ralph that it is ok for the pt to hold Brilinta from 05/23/21.  Response to be scanned into Epic.

## 2021-05-21 NOTE — Telephone Encounter (Signed)
Call placed to the pt and message left with instruction to hold Brilinta starting on 9/29.  I have also sent a My Chart message asking the pt to confirm that he received the message.

## 2021-05-23 ENCOUNTER — Encounter: Payer: No Typology Code available for payment source | Attending: General Surgery | Admitting: Skilled Nursing Facility1

## 2021-05-23 ENCOUNTER — Other Ambulatory Visit: Payer: Self-pay

## 2021-05-23 DIAGNOSIS — Z6841 Body Mass Index (BMI) 40.0 and over, adult: Secondary | ICD-10-CM | POA: Diagnosis present

## 2021-05-23 NOTE — Progress Notes (Signed)
Bariatric Nutrition Follow-Up Visit Medical Nutrition Therapy   NUTRITION ASSESSMENT    Anthropometrics    Body Composition Scale 05/23/2021  Weight  lbs 250.6  Total Body Fat  % 28.4     Visceral Fat 19  Fat-Free Mass  % 71.5     Total Body Water  % 52.5     Muscle-Mass  lbs 47.2  BMI 32.2  Body Fat Displacement ---        Torso  lbs 44.2        Left Leg  lbs 8.8        Right Leg  lbs 8.8        Left Arm  lbs 4.4        Right Arm  lbs 4.4   Clinical  Medical hx: DM, diverticulosis, kidney stones, HTN, sleep apnea, Coronary artery disease of native artery of native heart with stable angina pectoris (CMS-HCC)  PAT (paroxysmal atrial tachycardia) (CMS-HCC)  Medications: see list   Lifestyle & Dietary Hx  Currently Bolus EN placed in remnant stomach.   Osmolite 1.2: 39.6 grams protein giving about 50% of his needs adding the beneprotein to it giving about 52% of his needs per day  Bene protein 2 times a day: 6g (12 per day): pt states if he does the protein at night it keeps him awake so he does not eat after 5pm  Bolus: 6am  Bolus: 1145-2  Bolus: 5  4-5 150 mL: about 20-25 ounces  Unknown how many ounces of fluid mixed with protein.   Flushes with 50 mL x's 3 which equals 5 ounces   3 150 mL syringes of water: 15 ounces   Pts wife states they Dilute potassium but does not know with how much water.  Pt states he Drinks alkaline or smart water  He does try some liquids here and there PO but for pleasure not meeting needs.   Pt states his blood sugars have been about 108-134 never needing more than 4 units of his meal time insulin.  Pt states he has loves spicy and will add [place] to things.   Goals: Add 3 servings of BeneProtein to breakfast  Add 3 servings of BeneProtein to lunch  Have 3 servings of BeneProtein as a snack around 4:15 Try to get in one more just water 150 mL  Take your mealtime insulin about 15 minutes before your meal   Estimated  daily fluid intake: maybe 45-50 oz Estimated daily protein intake: 51.6 g Supplements: potassium, liquid bari multi, liquid B complex was advised not to take calcium yet  Labs:  Current average weekly physical activity: 25 minutes on stationary bike; lifting some weights      NUTRITION DIAGNOSIS  Overweight/obesity ([place]-3.3) related to past poor dietary habits and physical inactivity as evidenced by completed bariatric surgery and following dietary guidelines for continued weight loss and healthy nutrition status.   Learning Style & Readiness for Change Teaching method utilized: Visual & Auditory  Demonstrated degree of understanding via: Teach Back    RD's Notes for Next Visit Assess adherence to pt chosen goals   MONITORING & EVALUATION Dietary intake, weekly physical activity, body weight  Next Steps Patient is to follow-up: pt is call after next procedure for when diet is advanced

## 2021-05-24 NOTE — Anesthesia Preprocedure Evaluation (Addendum)
Anesthesia Evaluation  Patient identified by MRN, date of birth, ID band Patient awake    Reviewed: Allergy & Precautions, NPO status , Patient's Chart, lab work & pertinent test results, reviewed documented beta blocker date and time   History of Anesthesia Complications Negative for: history of anesthetic complications  Airway Mallampati: III  TM Distance: >3 FB Neck ROM: Full    Dental  (+) Missing, Dental Advisory Given,    Pulmonary sleep apnea and Continuous Positive Airway Pressure Ventilation , former smoker,    Pulmonary exam normal        Cardiovascular hypertension, Pt. on medications and Pt. on home beta blockers + CAD and + Cardiac Stents  Normal cardiovascular exam+ dysrhythmias Atrial Fibrillation and Supra Ventricular Tachycardia   TTE 2020: EF 55-60%, mild LAE, mild to moderate dilatation of aortic root and ascending aorta measuring 42 mm   Neuro/Psych negative neurological ROS  negative psych ROS   GI/Hepatic Neg liver ROS, GERD  Medicated,S/P Lap Gastric band with removal and conversion to Roux-n-y gastric bypass  Gastroesophageal stricture post lap gastric band   Endo/Other  diabetes, Poorly Controlled, Type 2Hx/o morbid obesity  Renal/GU Renal diseaseHx/o renal calculi  negative genitourinary   Musculoskeletal  (+) Arthritis ,   Abdominal   Peds  Hematology negative hematology ROS (+)   Anesthesia Other Findings   Reproductive/Obstetrics negative OB ROS                            Anesthesia Physical  Anesthesia Plan  ASA: 3  Anesthesia Plan: General   Post-op Pain Management:    Induction: Intravenous  PONV Risk Score and Plan: 2 and Ondansetron, Midazolam and Dexamethasone  Airway Management Planned: Oral ETT  Additional Equipment:   Intra-op Plan:   Post-operative Plan: Extubation in OR  Informed Consent: I have reviewed the patients History and  Physical, chart, labs and discussed the procedure including the risks, benefits and alternatives for the proposed anesthesia with the patient or authorized representative who has indicated his/her understanding and acceptance.     Dental advisory given  Plan Discussed with: Anesthesiologist, CRNA and Surgeon  Anesthesia Plan Comments:       Anesthesia Quick Evaluation

## 2021-05-27 ENCOUNTER — Ambulatory Visit (HOSPITAL_COMMUNITY): Payer: No Typology Code available for payment source | Admitting: Anesthesiology

## 2021-05-27 ENCOUNTER — Encounter (HOSPITAL_COMMUNITY)
Admission: RE | Disposition: A | Discharge status: Home / Self Care | Payer: Self-pay | Source: Ambulatory Visit | Attending: Gastroenterology

## 2021-05-27 ENCOUNTER — Ambulatory Visit (HOSPITAL_COMMUNITY): Payer: No Typology Code available for payment source

## 2021-05-27 ENCOUNTER — Ambulatory Visit (HOSPITAL_COMMUNITY)
Admission: RE | Admit: 2021-05-27 | Discharge: 2021-05-27 | Disposition: A | Discharge status: Home / Self Care | Payer: No Typology Code available for payment source | Source: Ambulatory Visit | Attending: Gastroenterology | Admitting: Gastroenterology

## 2021-05-27 ENCOUNTER — Encounter (HOSPITAL_COMMUNITY): Payer: Self-pay | Admitting: Gastroenterology

## 2021-05-27 ENCOUNTER — Other Ambulatory Visit: Payer: Self-pay

## 2021-05-27 DIAGNOSIS — I2510 Atherosclerotic heart disease of native coronary artery without angina pectoris: Secondary | ICD-10-CM | POA: Diagnosis not present

## 2021-05-27 DIAGNOSIS — I252 Old myocardial infarction: Secondary | ICD-10-CM | POA: Insufficient documentation

## 2021-05-27 DIAGNOSIS — E785 Hyperlipidemia, unspecified: Secondary | ICD-10-CM | POA: Diagnosis not present

## 2021-05-27 DIAGNOSIS — R933 Abnormal findings on diagnostic imaging of other parts of digestive tract: Secondary | ICD-10-CM | POA: Insufficient documentation

## 2021-05-27 DIAGNOSIS — Z6833 Body mass index (BMI) 33.0-33.9, adult: Secondary | ICD-10-CM | POA: Insufficient documentation

## 2021-05-27 DIAGNOSIS — Z8249 Family history of ischemic heart disease and other diseases of the circulatory system: Secondary | ICD-10-CM | POA: Diagnosis not present

## 2021-05-27 DIAGNOSIS — I5032 Chronic diastolic (congestive) heart failure: Secondary | ICD-10-CM | POA: Diagnosis not present

## 2021-05-27 DIAGNOSIS — Z955 Presence of coronary angioplasty implant and graft: Secondary | ICD-10-CM | POA: Insufficient documentation

## 2021-05-27 DIAGNOSIS — Z87891 Personal history of nicotine dependence: Secondary | ICD-10-CM | POA: Diagnosis not present

## 2021-05-27 DIAGNOSIS — I110 Hypertensive heart disease with heart failure: Secondary | ICD-10-CM | POA: Diagnosis not present

## 2021-05-27 DIAGNOSIS — E119 Type 2 diabetes mellitus without complications: Secondary | ICD-10-CM | POA: Insufficient documentation

## 2021-05-27 DIAGNOSIS — Z87442 Personal history of urinary calculi: Secondary | ICD-10-CM | POA: Diagnosis not present

## 2021-05-27 DIAGNOSIS — R1310 Dysphagia, unspecified: Secondary | ICD-10-CM | POA: Insufficient documentation

## 2021-05-27 DIAGNOSIS — Z980 Intestinal bypass and anastomosis status: Secondary | ICD-10-CM | POA: Insufficient documentation

## 2021-05-27 DIAGNOSIS — K2289 Other specified disease of esophagus: Secondary | ICD-10-CM | POA: Diagnosis not present

## 2021-05-27 DIAGNOSIS — Z833 Family history of diabetes mellitus: Secondary | ICD-10-CM | POA: Insufficient documentation

## 2021-05-27 DIAGNOSIS — Z79899 Other long term (current) drug therapy: Secondary | ICD-10-CM | POA: Insufficient documentation

## 2021-05-27 DIAGNOSIS — Z9884 Bariatric surgery status: Secondary | ICD-10-CM | POA: Insufficient documentation

## 2021-05-27 DIAGNOSIS — Z9049 Acquired absence of other specified parts of digestive tract: Secondary | ICD-10-CM | POA: Insufficient documentation

## 2021-05-27 DIAGNOSIS — Z794 Long term (current) use of insulin: Secondary | ICD-10-CM | POA: Insufficient documentation

## 2021-05-27 DIAGNOSIS — Z7982 Long term (current) use of aspirin: Secondary | ICD-10-CM | POA: Insufficient documentation

## 2021-05-27 DIAGNOSIS — Z8719 Personal history of other diseases of the digestive system: Secondary | ICD-10-CM | POA: Insufficient documentation

## 2021-05-27 HISTORY — PX: ESOPHAGOGASTRODUODENOSCOPY (EGD) WITH PROPOFOL: SHX5813

## 2021-05-27 HISTORY — PX: ESOPHAGEAL STENT PLACEMENT: SHX5540

## 2021-05-27 HISTORY — PX: ESOPHAGEAL DILATION: SHX303

## 2021-05-27 LAB — GLUCOSE, CAPILLARY: Glucose-Capillary: 123 mg/dL — ABNORMAL HIGH (ref 70–99)

## 2021-05-27 SURGERY — ESOPHAGOGASTRODUODENOSCOPY (EGD) WITH PROPOFOL
Anesthesia: General

## 2021-05-27 MED ORDER — SUCCINYLCHOLINE CHLORIDE 200 MG/10ML IV SOSY
PREFILLED_SYRINGE | INTRAVENOUS | Status: DC | PRN
  Administered 2021-05-27: 100 mg via INTRAVENOUS

## 2021-05-27 MED ORDER — EPHEDRINE SULFATE-NACL 50-0.9 MG/10ML-% IV SOSY
PREFILLED_SYRINGE | INTRAVENOUS | Status: DC | PRN
  Administered 2021-05-27: 7.5 mg via INTRAVENOUS

## 2021-05-27 MED ORDER — PROPOFOL 10 MG/ML IV BOLUS
INTRAVENOUS | Status: DC | PRN
  Administered 2021-05-27: 180 mg via INTRAVENOUS

## 2021-05-27 MED ORDER — SODIUM CHLORIDE 0.9 % IV SOLN
INTRAVENOUS | Status: DC | PRN
  Administered 2021-05-27: 25 mL

## 2021-05-27 MED ORDER — PROPOFOL 10 MG/ML IV BOLUS
INTRAVENOUS | Status: AC
  Filled 2021-05-27: qty 20

## 2021-05-27 MED ORDER — PHENYLEPHRINE HCL-NACL 20-0.9 MG/250ML-% IV SOLN
INTRAVENOUS | Status: DC | PRN
  Administered 2021-05-27: 50 ug/min via INTRAVENOUS

## 2021-05-27 MED ORDER — LIDOCAINE 2% (20 MG/ML) 5 ML SYRINGE
INTRAMUSCULAR | Status: DC | PRN
  Administered 2021-05-27: 80 mg via INTRAVENOUS

## 2021-05-27 MED ORDER — LACTATED RINGERS IV SOLN
INTRAVENOUS | Status: DC
  Administered 2021-05-27: 1000 mL via INTRAVENOUS

## 2021-05-27 MED ORDER — FENTANYL CITRATE (PF) 100 MCG/2ML IJ SOLN
INTRAMUSCULAR | Status: DC | PRN
  Administered 2021-05-27: 100 ug via INTRAVENOUS

## 2021-05-27 MED ORDER — FENTANYL CITRATE (PF) 100 MCG/2ML IJ SOLN
INTRAMUSCULAR | Status: AC
  Filled 2021-05-27: qty 2

## 2021-05-27 MED ORDER — MIDAZOLAM HCL 2 MG/2ML IJ SOLN
INTRAMUSCULAR | Status: AC
  Filled 2021-05-27: qty 2

## 2021-05-27 MED ORDER — ONDANSETRON HCL 4 MG/2ML IJ SOLN
INTRAMUSCULAR | Status: DC | PRN
  Administered 2021-05-27: 4 mg via INTRAVENOUS

## 2021-05-27 MED ORDER — MIDAZOLAM HCL 5 MG/5ML IJ SOLN
INTRAMUSCULAR | Status: DC | PRN
  Administered 2021-05-27: 2 mg via INTRAVENOUS

## 2021-05-27 MED ORDER — DEXAMETHASONE SODIUM PHOSPHATE 10 MG/ML IJ SOLN
INTRAMUSCULAR | Status: DC | PRN
  Administered 2021-05-27: 4 mg via INTRAVENOUS

## 2021-05-27 MED ORDER — PHENYLEPHRINE HCL (PRESSORS) 10 MG/ML IV SOLN
INTRAVENOUS | Status: AC
  Filled 2021-05-27: qty 2

## 2021-05-27 MED ORDER — SODIUM CHLORIDE 0.9 % IV SOLN: INTRAVENOUS | Status: DC

## 2021-05-27 SURGICAL SUPPLY — 15 items

## 2021-05-27 NOTE — Anesthesia Procedure Notes (Addendum)
Procedure Name: Intubation Date/Time: 05/27/2021 7:52 AM Performed by: Ponciano Ort, CRNA Pre-anesthesia Checklist: Patient identified, Emergency Drugs available, Suction available and Patient being monitored Patient Re-evaluated:Patient Re-evaluated prior to induction Oxygen Delivery Method: Circle system utilized Preoxygenation: Pre-oxygenation with 100% oxygen Induction Type: IV induction and Rapid sequence Laryngoscope Size: Miller and 2 Grade View: Grade I Tube type: Oral Tube size: 7.5 mm Number of attempts: 1 Airway Equipment and Method: Stylet and Oral airway Placement Confirmation: ETT inserted through vocal cords under direct vision, positive ETCO2 and breath sounds checked- equal and bilateral Secured at: 23 cm Tube secured with: Tape Dental Injury: Teeth and Oropharynx as per pre-operative assessment

## 2021-05-27 NOTE — Addendum Note (Signed)
Addendum  created 05/27/21 1124 by Heather Roberts, MD   Clinical Note Signed

## 2021-05-27 NOTE — Interval H&P Note (Signed)
History and Physical Interval Note:  05/27/2021 7:39 AM  Hector Curry  has presented today for surgery, with the diagnosis of dysphagia- fluoro.  The various methods of treatment have been discussed with the patient and family. After consideration of risks, benefits and other options for treatment, the patient has consented to  Procedure(s): ESOPHAGOGASTRODUODENOSCOPY (EGD) WITH PROPOFOL (N/A) as a surgical intervention.  The patient's history has been reviewed, patient examined, no change in status, stable for surgery.  I have reviewed the patient's chart and labs.  Questions were answered to the patient's satisfaction.     [employer]

## 2021-05-27 NOTE — Anesthesia Postprocedure Evaluation (Addendum)
Anesthesia Post Note  Patient: Hector Curry  Procedure(s) Performed: ESOPHAGOGASTRODUODENOSCOPY (EGD) WITH PROPOFOL ESOPHAGEAL STENT PLACEMENT ESOPHAGEAL DILATION     Patient location during evaluation: Endoscopy Anesthesia Type: General Level of consciousness: sedated Pain management: pain level controlled Vital Signs Assessment: post-procedure vital signs reviewed and stable Respiratory status: spontaneous breathing and respiratory function stable Cardiovascular status: stable Postop Assessment: no apparent nausea or vomiting Anesthetic complications: no   No notable events documented.  Last Vitals:  Vitals:   05/27/21 0950 05/27/21 1000  BP: 115/69 130/74  Pulse: 79 79  Resp: 10 (!) 25  Temp:    SpO2: 91% 93%    Last Pain:  Vitals:   05/27/21 1000  TempSrc:   PainSc: 0-No pain                 Bernis Schreur DANIEL

## 2021-05-27 NOTE — Transfer of Care (Signed)
Immediate Anesthesia Transfer of Care Note  Patient: Hector Curry  Procedure(s) Performed: ESOPHAGOGASTRODUODENOSCOPY (EGD) WITH PROPOFOL ESOPHAGEAL STENT PLACEMENT ESOPHAGEAL DILATION  Patient Location: PACU  Anesthesia Type:General  Level of Consciousness: awake  Airway & Oxygen Therapy: Patient Spontanous Breathing  Post-op Assessment: Report given to RN and Post -op Vital signs reviewed and stable  Post vital signs: Reviewed and stable  Last Vitals:  Vitals Value Taken Time  BP 145/66 05/27/21 0931  Temp    Pulse 81 05/27/21 0933  Resp 12 05/27/21 0933  SpO2 94 % 05/27/21 0933  Vitals shown include unvalidated device data.  Last Pain:  Vitals:   05/27/21 0652  TempSrc:   PainSc: 0-No pain         Complications: No notable events documented.

## 2021-05-27 NOTE — Discharge Instructions (Signed)
YOU HAD AN ENDOSCOPIC PROCEDURE TODAY: Refer to the procedure report and other information in the discharge instructions given to you for any specific questions about what was found during the examination. If this information does not answer your questions, please call [place] office at [phone] to clarify.   YOU SHOULD EXPECT: Some feelings of bloating in the abdomen. Passage of more gas than usual. Walking can help get rid of the air that was put into your GI tract during the procedure and reduce the bloating. If you had a lower endoscopy (such as a colonoscopy or flexible sigmoidoscopy) you may notice spotting of blood in your stool or on the toilet paper. Some abdominal soreness may be present for a day or two, also.  DIET: Your first meal following the procedure should be a light meal and then it is ok to progress to your normal diet. A half-sandwich or bowl of soup is an example of a good first meal. Heavy or fried foods are harder to digest and may make you feel nauseous or bloated. Drink plenty of fluids but you should avoid alcoholic beverages for 24 hours. If you had a esophageal dilation, please see attached instructions for diet.    ACTIVITY: Your care partner should take you home directly after the procedure. You should plan to take it easy, moving slowly for the rest of the day. You can resume normal activity the day after the procedure however YOU SHOULD NOT DRIVE, use power tools, machinery or perform tasks that involve climbing or major physical exertion for 24 hours (because of the sedation medicines used during the test).   SYMPTOMS TO REPORT IMMEDIATELY: A gastroenterologist can be reached at any hour. Please call [phone]  for any of the following symptoms:   Following upper endoscopy (EGD, EUS, ERCP, esophageal dilation) Vomiting of blood or coffee ground material  New, significant abdominal pain  New, significant chest pain or pain under the shoulder blades  Painful or  persistently difficult swallowing  New shortness of breath  Black, tarry-looking or red, bloody stools  FOLLOW UP:  If any biopsies were taken you will be contacted by phone or by letter within the next 1-3 weeks. Call [phone]  if you have not heard about the biopsies in 3 weeks.  Please also call with any specific questions about appointments or follow up tests.  

## 2021-05-27 NOTE — Op Note (Signed)
[facility] Patient Name: Hector Curry Procedure Date: 05/27/2021 MRN: [id number] Attending MD: Justice Britain , MD Date of Birth: 09/12/1960 CSN: [id number] Age: 60 Admit Type: Outpatient Procedure:                Upper GI endoscopy Indications:              Dysphagia, Abnormal UGI series, Assessment                            following Roux-en-Y gastrojejunostomy Providers:                Justice Britain, MD, Jeanella Cara, RN,                            Benetta Spar, Technician, Cleda Daub, CRNA Referring MD:             Arta Bruce Kinsinger MD, MD, Leonie Douglas. Doy Hutching, MD Medicines:                General Anesthesia Complications:            No immediate complications. Estimated Blood Loss:     Estimated blood loss was minimal. Procedure:                Pre-Anesthesia Assessment:                           - Prior to the procedure, a History and Physical                            was performed, and patient medications and                            allergies were reviewed. The patient's tolerance of                            previous anesthesia was also reviewed. The risks                            and benefits of the procedure and the sedation                            options and risks were discussed with the patient.                            All questions were answered, and informed consent                            was obtained. Prior Anticoagulants: The patient                            last took Plavix (clopidogrel) on the day of the                            procedure and has taken no previous anticoagulant  or antiplatelet agents except for aspirin. ASA                            Grade Assessment: III - A patient with severe                            systemic disease. After reviewing the risks and                            benefits, the patient was deemed in satisfactory                             condition to undergo the procedure.                           After obtaining informed consent, the endoscope was                            passed under direct vision. Throughout the                            procedure, the patient's blood pressure, pulse, and                            oxygen saturations were monitored continuously. The                            GIF-H190 ([id number]) Olympus endoscope was introduced                            through the mouth, and advanced to the esophageal                            anastomosis. The GIF-[id number] ([id number]) Olympus slim                            endoscope was introduced through the mouth, and                            advanced to the esophageal anastomosis. The                            [id number]-[id number] ([id number]) Olympus therapeutic endoscope                            was introduced through the mouth, and advanced to                            the esophageal anastomosis. The upper GI endoscopy                            was technically difficult and complex due to  stricture. Successful completion of the procedure                            was aided by performing the maneuvers documented                            (below) in this report. The patient tolerated the                            procedure. Scope In: Scope Out: Findings:      Fluid and some foodstuffs were found in the mid esophagus and in the       distal esophagus. Suction via Endoscope was performed to remove the       contents.      No gross mucosal lesions were noted in the entire esophagus.      Two tongues of salmon-colored mucosa were present from 46 to 48 cm. No       other visible abnormalities were present.      The Z-line was irregular and was found 48 cm from the incisors.      Staples were found in the distal esophagus.      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 This region was characterized       by edema, erythema, friable mucosa, inflammation, severe stenosis and an       intact staple line. The stenosis was quite tight and the neonatal       endoscope could not traverse this region. I proceeded with placement of       a long 0.035 inch Soft Jagwire which seemed to go nicely into the rest       of the gastric pouch. I proceeded with above balloon injection with       iodinated contrast for assessment of the length of stenosis which was       felt to be less than 1 cm in length. I was able to finally pass the       balloon with pressure to go through the stenosis (the catheter is 2.5 mm       in diameter). I then proceeded with attempt at [facility] placement.       This was stented with an AXIOS LAMS 15 mm x 10 mm under fluoroscopic       guidance. A TTS dilator was passed through the scope. Dilation with a       Hurricane 6 mm balloon dilator was performed under fluoroscopic guidance       to gently begin opening the stent - since patient was still on       Brillanta. I could see beyond the AXIOS into the gastric       pouch/gastrojejunostomy but due to stent placement just now decided not       to try and enter into the region. Impression:               - Fluid and foodstuffs were found in the mid                            esophagus and in the distal esophagus - removed.                           -  No gross mucosal lesions in esophagus.                           - Salmon-colored mucosa suspicious for Barrett's                            esophagus distally.                           - Z-line irregular, 48 cm from the incisors.                           - Staples were found in the esophagus distally.                           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 Injected contrast to delineate the                            length of the stricture and then AXIOS LAMS                            prosthesis placed. Dilated slightly to not increase                            risk of bleeding significantly. Moderate Sedation:      Not Applicable - Patient had care per Anesthesia. Recommendation:           - The patient will be observed post-procedure,                            until all discharge criteria are met.                           - Discharge patient to home.                           - Patient has a contact number available for                            emergencies. The signs and symptoms of potential                            delayed complications were discussed with the                            patient. Return to  normal activities tomorrow.                            Written discharge instructions were provided to the                            patient.                           - No more than clear liquid diet for next week.                            Pending how patient is doing within the next 1-2                            weeks, will be able to try to add purees and soft                            diet thereafter.                           - Continue present medications. OK to continue                            Brillanta and Aspirin tomorrow.                           - KUB and CXR in 2-weeks.                           - Repeat upper endoscopy in 4-6 weeks for                            surveillance to assess removal of the stent vs                            upsizing - we do not want to allow too significant                            tissue ingrowth around AXIOS.                           - The findings and recommendations were discussed                            with the patient.                           - The findings and recommendations were discussed                            with the  patient's family. Procedure Code(s):        --- Professional ---  40086, Esophagoscopy, flexible, transoral; with                            placement of endoscopic stent (includes pre- and                            post-dilation and guide wire passage, when                            performed) Diagnosis Code(s):        --- Professional ---                           K22.8, Other specified diseases of esophagus                           Z97.8, Presence of other specified devices                           Z98.84, Bariatric surgery status                           R13.10, Dysphagia, unspecified                           Z09, Encounter for follow-up examination after                            completed treatment for conditions other than                            malignant neoplasm                           Z98.0, Intestinal bypass and anastomosis status                           R93.3, Abnormal findings on diagnostic imaging of                            other parts of digestive tract CPT copyright 2019 American Medical Association. All rights reserved. The codes documented in this report are preliminary and upon coder review may  be revised to meet current compliance requirements. Justice Britain, MD 05/27/2021 9:53:39 AM Number of Addenda: 0

## 2021-05-28 ENCOUNTER — Telehealth: Payer: Self-pay

## 2021-05-28 NOTE — Telephone Encounter (Signed)
-----   Message from Lemar Lofty., MD sent at 05/27/2021  5:04 PM EDT ----- Regarding: Follow-up Hector Curry, This patient needs a clinic visit with me in follow-up in approximately 3 weeks. Okay to use overbook or held slots. Patient needs EGD with fluoroscopy and stent removal/exchange in 5 to 6 weeks 1 hour Case. Thanks. GM

## 2021-05-28 NOTE — Telephone Encounter (Signed)
Left message on machine to call back  

## 2021-05-29 ENCOUNTER — Other Ambulatory Visit: Payer: Self-pay

## 2021-05-29 DIAGNOSIS — K222 Esophageal obstruction: Secondary | ICD-10-CM

## 2021-05-29 NOTE — Telephone Encounter (Signed)
[name], Please schedule the patient for EGD with AXIOS stent pull 1 week after patient's clinic visit in November. Thanks. GM

## 2021-05-29 NOTE — Telephone Encounter (Signed)
The pt returned call and has been scheduled for office visit on 07/03/21 at 910 am and EGD at [facility] with GM on 07/29/21 at 10 am.  The pt was advised and all information has been sent to the pt My Chart.

## 2021-05-29 NOTE — Telephone Encounter (Signed)
[name], Please also schedule a chest x-ray and KUB 2 view to be done at [facility] as an outpatient. This is for gastric stent evaluation. He can have this done within the next week to week and a half. I had spoken with the patient about this, and just let him know he can get that done at some point next week. Thanks. GM

## 2021-05-29 NOTE — Telephone Encounter (Signed)
Left message on machine to call back  

## 2021-05-30 ENCOUNTER — Other Ambulatory Visit: Payer: Self-pay

## 2021-05-30 DIAGNOSIS — T189XXA Foreign body of alimentary tract, part unspecified, initial encounter: Secondary | ICD-10-CM

## 2021-05-30 NOTE — Telephone Encounter (Signed)
The pt was advised of the appt information and all questions answered. He will have xray next week at [facility]

## 2021-05-30 NOTE — Telephone Encounter (Signed)
The procedure was moved to 07/08/21 at 730 am at [facility].  Xray orders entered for [facility].  New instructions sent to the pt My Chart and regular mail.  Call also placed to inform pt of changes.

## 2021-06-03 ENCOUNTER — Other Ambulatory Visit (HOSPITAL_COMMUNITY): Payer: Self-pay

## 2021-06-03 ENCOUNTER — Ambulatory Visit (HOSPITAL_COMMUNITY)
Admission: RE | Admit: 2021-06-03 | Discharge: 2021-06-03 | Disposition: A | Discharge status: Home / Self Care | Payer: No Typology Code available for payment source | Source: Ambulatory Visit | Attending: Gastroenterology | Admitting: Gastroenterology

## 2021-06-03 ENCOUNTER — Other Ambulatory Visit: Payer: Self-pay

## 2021-06-03 DIAGNOSIS — T189XXA Foreign body of alimentary tract, part unspecified, initial encounter: Secondary | ICD-10-CM

## 2021-06-10 ENCOUNTER — Other Ambulatory Visit (HOSPITAL_COMMUNITY): Payer: Self-pay

## 2021-06-17 ENCOUNTER — Other Ambulatory Visit: Payer: Self-pay | Admitting: *Deleted

## 2021-06-17 ENCOUNTER — Telehealth: Payer: Self-pay | Admitting: Cardiovascular Disease

## 2021-06-17 MED ORDER — DILTIAZEM 12 MG/ML ORAL SUSPENSION: 60.0000 mg | Freq: Three times a day (TID) | ORAL | 3 refills | Status: DC | PRN

## 2021-06-17 MED ORDER — DILTIAZEM 12 MG/ML ORAL SUSPENSION: 60.0000 mg | Freq: Three times a day (TID) | ORAL | 1 refills | Status: DC | PRN

## 2021-06-17 NOTE — Addendum Note (Signed)
Addended by: Malena Peer D on: 06/17/2021 05:01 PM   Modules accepted: Orders

## 2021-06-17 NOTE — Telephone Encounter (Signed)
I called in a verbal Rx for diltiazem 10mg /ml 60mg  q 8 hr prn x 1 refill

## 2021-06-17 NOTE — Addendum Note (Signed)
Addended by: Kendrick Fries on: 06/17/2021 02:42 PM   Modules accepted: Orders

## 2021-06-17 NOTE — Telephone Encounter (Signed)
Please advise for correct refill. Medication keeps printing instead of sending through [employer]. Please double check that I am sending the correct mL amount for 90 days. Last filled by our office as described.

## 2021-06-17 NOTE — Telephone Encounter (Signed)
    *  STAT* If patient is at the pharmacy, call can be transferred to refill team.   1. Which medications need to be refilled? (please list name of each medication and dose if known)   Diltiazem oral suspension   2. Which pharmacy/location (including street and city if local pharmacy) is medication to be sent to? [facility] in [place]  [phone]   3. Do they need a 30 day or 90 day supply? 90  Patient out please send asap .

## 2021-06-17 NOTE — Addendum Note (Signed)
Addended by: Maple Hudson on: 06/17/2021 02:58 PM   Modules accepted: Orders

## 2021-06-20 ENCOUNTER — Other Ambulatory Visit: Payer: Self-pay

## 2021-06-20 ENCOUNTER — Other Ambulatory Visit (HOSPITAL_COMMUNITY): Payer: Self-pay

## 2021-06-25 ENCOUNTER — Other Ambulatory Visit (HOSPITAL_COMMUNITY): Payer: Self-pay

## 2021-06-25 MED ORDER — CETIRIZINE HCL 1 MG/ML PO SOLN
ORAL | 11 refills | Status: DC
  Filled 2021-06-25: qty 300, 30d supply, fill #0

## 2021-06-25 MED ORDER — POTASSIUM CHLORIDE 20 MEQ/15ML (10%) PO SOLN
ORAL | 2 refills | Status: DC
  Filled 2021-06-25: qty 473, 16d supply, fill #0
  Filled 2021-07-24: qty 473, 16d supply, fill #1

## 2021-06-26 ENCOUNTER — Other Ambulatory Visit (HOSPITAL_COMMUNITY): Payer: Self-pay

## 2021-06-27 ENCOUNTER — Encounter (HOSPITAL_COMMUNITY): Payer: Self-pay | Admitting: Gastroenterology

## 2021-07-03 ENCOUNTER — Ambulatory Visit (INDEPENDENT_AMBULATORY_CARE_PROVIDER_SITE_OTHER): Payer: No Typology Code available for payment source | Admitting: Gastroenterology

## 2021-07-03 ENCOUNTER — Encounter: Payer: Self-pay | Admitting: Gastroenterology

## 2021-07-03 VITALS — BP 100/60 | HR 79 | Ht 74.0 in | Wt 245.0 lb

## 2021-07-03 DIAGNOSIS — K311 Adult hypertrophic pyloric stenosis: Secondary | ICD-10-CM | POA: Diagnosis not present

## 2021-07-03 DIAGNOSIS — Z9884 Bariatric surgery status: Secondary | ICD-10-CM

## 2021-07-03 DIAGNOSIS — Z7902 Long term (current) use of antithrombotics/antiplatelets: Secondary | ICD-10-CM

## 2021-07-03 DIAGNOSIS — K9189 Other postprocedural complications and disorders of digestive system: Secondary | ICD-10-CM | POA: Diagnosis not present

## 2021-07-03 DIAGNOSIS — R1319 Other dysphagia: Secondary | ICD-10-CM | POA: Diagnosis not present

## 2021-07-03 NOTE — Patient Instructions (Addendum)
Please send my chart message of what anti-nausea medication that you are taking.   Patient to remain on Brilinta for procedure.   If you are age 59 or older, your body mass index should be between 23-30. Your Body mass index is 31.46 kg/m. If this is out of the aforementioned range listed, please consider follow up with your Primary Care Provider.  If you are age 23 or younger, your body mass index should be between 19-25. Your Body mass index is 31.46 kg/m. If this is out of the aformentioned range listed, please consider follow up with your Primary Care Provider.   ________________________________________________________  The [facility] providers would like to encourage you to use [facility] to communicate with providers for non-urgent requests or questions.  Due to long hold times on the telephone, sending your provider a message by [facility] may be a faster and more efficient way to get a response.  Please allow 48 business hours for a response.  Please remember that this is for non-urgent requests.  _______________________________________________________  Thank you for choosing me and [facility].  Dr. Meridee Score

## 2021-07-03 NOTE — Progress Notes (Signed)
[facility] VISIT   Primary Care Provider [name], MD [place] [address] [phone]  Referring Provider Dr. Kieth Brightly   Patient Profile: Hector Curry is a 60 y.o. 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  The patient presents to the [facility] for an evaluation and management of problem(s) noted below:  Problem List 1. Gastrojejunal anastomotic stricture   2. Gastric outlet obstruction   3. Esophageal dysphagia   4. History of Roux-en-Y gastric bypass   5. Antiplatelet or antithrombotic long-term use     History of Present Illness Please see prior progress notes for full details of HPI.  Interval History The patient returns for scheduled follow-up with his wife.  He has done exceedingly well with our AXIOS stent.  He has been able to transition to oral intake by mouth.  He has been losing weight pretty significantly so is still utilizing tube feeds during the course of the day for supplementation.  He has noted over the course of the last few days some increased issues with nausea for which she has began to use a previously prescribed antiemetic but they are not clear of but will get back to [place] as to which it is.  Bowels are moving regularly.  The patient and wife were able to enjoy their son's wedding without further spit up/spit bag availability.  No significant hematemesis or coffee-ground emesis.  GI Review of Systems Positive as above Negative for dysphagia, odynophagia, bloating, alteration of bowel habits,  melena, hematochezia   Review of Systems General: Denies fevers/chills Cardiovascular: Denies chest pain/palpitations Pulmonary: Denies shortness of breath Gastroenterological: See HPI Genitourinary: Denies darkened urine Hematological: Positive for easy bruising/bleeding due to antiplatelet therapy Dermatological: Denies jaundice Psychological: Mood is stable   Medications Current Outpatient Medications  Medication Sig Dispense Refill   Acetaminophen (TYLENOL PO) 1,000 mg by Feeding Tube route every 6 (six) hours as needed (pain). Tylenol 500mg /38ml     aspirin 81 MG chewable tablet 81 mg daily. Per tube     atorvastatin (LIPITOR) 40 MG tablet Place 1 tablet (40 mg total) into feeding tube daily at 6 PM as directed. 30 tablet 11   cetirizine HCl (ZYRTEC) 1 MG/ML solution Take 10 mLs (10 mg total) by mouth once daily (Patient taking differently: Take 5 mg by mouth daily.) 300 mL 11   cetirizine HCl (ZYRTEC) 1 MG/ML solution Take 10 mLs (10 mg total) by mouth once daily 300 mL 11   Continuous Blood Gluc Sensor (FREESTYLE LIBRE 2 SENSOR) MISC Use as directed every 14 days 10 each 1   diltiazem (CARDIZEM) 10 mg/ml SOLN oral suspension Place 6 mLs (60 mg total) into feeding tube every 8 (eight) hours as needed. 270 mL 1   fluticasone (FLONASE) 50 MCG/ACT nasal spray Place 1 spray into both nostrils daily as needed for allergies or rhinitis.     insulin lispro (HUMALOG) 100 UNIT/ML KwikPen Inject 0-20 Units into the skin 3 (three) times daily with meals. (according to sliding scale) (Patient taking differently: Inject 3-4 Units into the skin 3 (three) times daily as needed (high blood sugar).) 15 mL 2   Insulin Pen Needle 32G  X 4 MM MISC Use as needed as directed 100 each 3   Melatonin 5 MG TBDP 5 mg by Feeding Tube route at bedtime as needed (sleep).     Metoprolol Tartrate (FIRST - METOPROLOL) 10 MG/ML SOLN Take 5 mls via g-tube 2 times a day **refrigerate** 900 mL 3   nitroGLYCERIN  (NITROSTAT) 0.4 MG SL tablet Place 1 tablet (0.4 mg total) under the tongue every 5 (five) minutes as needed for chest pain. 25 tablet 3   Nutritional Supplements (FEEDING SUPPLEMENT, OSMOLITE 1.2 CAL,) LIQD Place 1,000 mLs into feeding tube continuous. -Begin with 50 ml Osmolite 1.2 via G-tube every 2 hours (8 feedings total while awake) -Free water flush with 30 ml before and after each bolus As tolerated work up to 100 ml Osmolite 1.2 7 times daily (~every 2 hours). -Continue Prosource TF BID. -3 cartons total of Osmolite 1.2 + Prosource TF BID provides 935 kcals (66% of needs), 61g protein (77% of needs), 112g CHO and 1005 ml H2O. -Free water of 240 ml TID via G-tube or PO -Will meet rest of needs with other liquids/diet and protein supplements (Patient taking differently: Place 237 mLs into feeding tube 3 (three) times daily.) 1000 mL 20   Omeprazole 20 MG TBDD 20 mg by Feeding Tube route daily.     OVER THE COUNTER MEDICATION 30 mLs by Feeding Tube route daily. Bariatric liquid multivitamin     OVER THE COUNTER MEDICATION 30 mLs by Feeding Tube route daily. Bariatric b complex liquid     potassium chloride 20 MEQ/15ML (10%) SOLN Dilute 15 mls of solution (20 mEq total) as directed and place into feeding tube 2 (two) times daily. 473 mL 2   PRESCRIPTION MEDICATION at bedtime. Bipap     Propylene Glycol (SYSTANE BALANCE OP) Place 1 drop into both eyes daily as needed (dry eyes).     protein supplement (RESOURCE BENEPROTEIN) POWD Take 1 Scoop by mouth in the morning and at bedtime.     SODIUM FLUORIDE 5000 SENSITIVE 1.1-5 % GEL Place 1 application onto teeth daily.     ticagrelor (BRILINTA) 90 MG TABS tablet Place 1 tablet (90 mg total) into feeding tube 2 (two) times daily. 60 tablet 2   Water For Irrigation, Sterile (FREE WATER) SOLN Place 60 mLs into feeding tube every 8 (eight) hours.     No current facility-administered medications for this visit.    Allergies Allergies  Allergen  Reactions   Other Other (See Comments)    Cats    Histories Past Medical History:  Diagnosis Date   Allergic rhinitis    Atrial fibrillation (HCC)    Coronary artery disease, non-occlusive    a. 05/08/2015 Cath: no significant CAD, LVEF nl-->Med; b. 07/2017 MV: attenuation artifact, no ischemia, EF 65%-->Low risk.   Diabetes mellitus without complication (HCC)    Diastolic dysfunction    a. 04/2015 Echo: EF 60-65%, mild MR, mildly dil LA, nl RV fxn, nl PASP; b. 05/2016 Echo: EF 60-65%, no rmwa, Gr1 DD, mildly dil Ao root and asc Ao; c. 07/2017 Echo: EF 55-60%, Ao root 20mm. mildly dil LA; d. 03/2019 Echo: EF 55-60%, nl RV fxn. Mild LAE, Ao root/Asc Ao 7mm.   Dilated aortic root (HCC)    a. 07/2017 Echo: 54mm Ao root - mildly dil; b. 03/2019 Echo: Ao root 59mm.   Diverticulosis 10/03/2014   Dysrhythmia    Family history of premature CAD    a. father passed from MI at 35  GERD (gastroesophageal reflux disease)    Hemorrhoids    a. internal hemorrhoids s/p surgery 1999   History of kidney stones    History of tobacco abuse    Hyperlipidemia    Hyperplastic colon polyp 10/03/2014   a. x 2    Hypertension    Inflammatory arthritis    a. CCP antibodies & x-rays negative. Rheumatoid factor 14, felt to be crystaline over RA or psoriatic   Morbid obesity (HCC)    a. s/p LAP-BAND   OSA (obstructive sleep apnea)    a. on CPAP   Osteoarthritis    PSVT (paroxysmal supraventricular tachycardia) (HCC)    a. 48 hr Holter 04/2015: NSR w/ rare PVC, short runs of narrow complex tachycardiac, possible atrial tach, longest run 7 beats, PACs noted (2% of all beats 3600 total) they did not seem to correlate w/ significant arrythmia; b. 08/2017 Event monitor: no significant arrhythmias.   Past Surgical History:  Procedure Laterality Date   BARIATRIC SURGERY     lap band    CARDIAC CATHETERIZATION N/A 05/08/2015   Procedure: Left Heart Cath and Coronary Angiography;  Surgeon: Corky CraftsJayadeep S Varanasi, MD;   [facility];  Service: Cardiovascular;  Laterality: N/A;   CARPAL TUNNEL RELEASE Left    CHOLECYSTECTOMY     COLONOSCOPY  06/27/2005   COLONOSCOPY  10/03/2014   CORONARY STENT INTERVENTION N/A 04/15/2021   Procedure: CORONARY STENT INTERVENTION;  Surgeon: Runell GessBerry, Jonathan J, MD;  Location: [facility];  Service: Cardiovascular;  Laterality: N/A;   ESOPHAGEAL DILATION  05/27/2021   Procedure: ESOPHAGEAL DILATION;  Surgeon: Meridee ScoreMansouraty, Netty StarringGabriel Jr., MD;  [facility];  Service: Gastroenterology;;   ESOPHAGEAL STENT PLACEMENT N/A 05/27/2021   Procedure: ESOPHAGEAL STENT PLACEMENT;  Surgeon: Lemar LoftyMansouraty, Judah Carchi Jr., MD;  [facility];  Service: Gastroenterology;  Laterality: N/A;   ESOPHAGOGASTRODUODENOSCOPY  06/27/2005   ESOPHAGOGASTRODUODENOSCOPY N/A 03/19/2021   Procedure: ESOPHAGOGASTRODUODENOSCOPY (EGD);  Surgeon: Sheliah HatchKinsinger, De BlanchLuke Aaron, MD;  [facility];  Service: General;  Laterality: N/A;   ESOPHAGOGASTRODUODENOSCOPY (EGD) WITH PROPOFOL N/A 05/27/2021   Procedure: ESOPHAGOGASTRODUODENOSCOPY (EGD) WITH PROPOFOL;  Surgeon: Lemar LoftyMansouraty, Racine Erby Jr., MD;  Location: [facility];  Service: Gastroenterology;  Laterality: N/A;   GASTRIC ROUX-EN-Y N/A 02/18/2021   Procedure: LAPAROSCOPIC ROUX-EN-Y GASTRIC BYPASS WITH UPPER ENDOSCOPY,;  Surgeon: Sheliah HatchKinsinger, De BlanchLuke Aaron, MD;  Location: [facility];  Service: General;  Laterality: N/A;   HEMORRHOID SURGERY  08/25/1997   IR GJ TUBE CHANGE  03/27/2021   LAPAROSCOPIC INSERTION GASTROSTOMY TUBE N/A 03/20/2021   Procedure: LAPAROSCOPIC INSERTION GASTROSTOMY TUBE;  Surgeon: Rodman PickleKinsinger, Luke Aaron, MD;  Location: [facility];  Service: General;  Laterality: N/A;   LEFT HEART CATH AND CORONARY ANGIOGRAPHY N/A 04/15/2021   Procedure: LEFT HEART CATH AND CORONARY ANGIOGRAPHY;  Surgeon: Runell GessBerry, Jonathan J, MD;  [facility]: [facility];  Service: Cardiovascular;  Laterality: N/A;   MOUTH SURGERY     removed area which was  benign   TONSILLECTOMY     UPPER GI ENDOSCOPY N/A 03/20/2021   Procedure: UPPER GI ENDOSCOPY;  Surgeon: Sheliah HatchKinsinger, De BlanchLuke Aaron, MD;  Location: [facility];  Service: General;  Laterality: N/A;   Social History   Socioeconomic History   Marital status: Married    Spouse name: Not on file   Number of children: Not on file   Years of education: Not on file   Highest education level: Not on file  Occupational History   Not on file  Tobacco Use  Smoking status: Former    Packs/day: 1.00    Years: 15.00    Pack years: 15.00    Types: Cigarettes    Quit date: 08/05/1989    Years since quitting: 31.9   Smokeless tobacco: Never  Vaping Use   Vaping Use: Never used  Substance and Sexual Activity   Alcohol use: Never   Drug use: Never   Sexual activity: Not on file  Other Topics Concern   Not on file  Social History Narrative   Not on file   Social Determinants of Health   Financial Resource Strain: Not on file  Food Insecurity: Not on file  Transportation Needs: Not on file  Physical Activity: Not on file  Stress: Not on file  Social Connections: Not on file  Intimate Partner Violence: Not on file   Family History  Problem Relation Age of Onset   Hypertension Mother    Diabetes Mother    Heart attack Father 55   CAD Father    Hypertension Sister    Diabetes Other    Hypertension Other    Heart disease Other    Colon cancer Neg Hx    Esophageal cancer Neg Hx    Pancreatic cancer Neg Hx    Stomach cancer Neg Hx    Liver disease Neg Hx    Inflammatory bowel disease Neg Hx    Rectal cancer Neg Hx    I have reviewed his medical, social, and family history in detail and updated the electronic medical record as necessary.    PHYSICAL EXAMINATION  BP 100/60   Pulse 79   Ht 6\' 2"  (1.88 m)   Wt 245 lb (111.1 kg)   BMI 31.46 kg/m  Wt Readings from Last 3 Encounters:  07/03/21 245 lb (111.1 kg)  05/27/21 250 lb 10.6 oz (113.7 kg)  05/23/21 250 lb 9.6 oz (113.7 kg)   GEN: NAD, appears stated age, doesn't appear chronically ill, accompanied by wife, with spit bag passing saliva every few minutes PSYCH: Cooperative, without pressured speech EYE: Conjunctivae pink, sclerae anicteric ENT: MMM, without oral ulcers CV: RR without R/Gs  RESP: No wheezing GI: NABS, soft, protuberant abdomen, surgical scars present, G-tube in place, NT, without rebound MSK/EXT: Lower extremity edema present SKIN: No jaundice NEURO:  Alert & Oriented x 3, no focal deficits   REVIEW OF DATA  I reviewed the following data at the time of this encounter:  GI Procedures and Studies  October 2022 EGD - Fluid and foodstuffs were found in the mid esophagus and in the distal esophagus - removed. - No gross mucosal lesions in esophagus. - Salmon-colored mucosa suspicious for Barrett's esophagus distally. - Z-line irregular, 48 cm from the incisors. - Staples were found in the esophagus distally. - A gastric bypass was found and the anastomosis was characterized by severe stenosis, an intact staple line, friable mucosa, inflammation, edema and erythema. Injected contrast to delineate the length of the stricture and then AXIOS LAMS prosthesis placed. Dilated slightly to not increase risk of bleeding significantly.  Laboratory Studies  Reviewed those in epic and care everywhere  Imaging Studies  October 2022 KUB/chest x-ray IMPRESSION: 1. Nonobstructive bowel gas pattern. 2. No soft esophageal stent identified. 3. Left upper quadrant gastrostomy tube again seen.  IMPRESSION: 1.  No radiographic evidence of acute cardiopulmonary disease. 2. No esophageal stent confidently identified.  Please see prior progress notes for full details of HPI.   ASSESSMENT  Mr. Saelens is a 60 y.o.  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  The patient is seen today for evaluation and management of:  1. Gastrojejunal anastomotic stricture   2. Gastric outlet obstruction   3. Esophageal dysphagia   4. History of Roux-en-Y gastric bypass   5. Antiplatelet or antithrombotic long-term use    The patient is clinically and hemodynamically stable.  He has done very well with AXIOS stenting of his gastrojejunal anastomosis stricture.  He is scheduled for stent removal next week.  I will determine at that time whether I tried to increase his stent size up to 20 mm to further allow dilation of this area versus allowing the patient to just see how he does with the 50 mm expansion.  There is an increased risk of bleeding since he will be remaining on Brilinta and aspirin but with the amount of time that this has been in place and I think that will be less likely.  Time will tell.  We will defer his continued use of tube feeding to his primary bariatric surgeon.  The patient and wife would let us know what antiemetic he is taking at this time.  We will evaluate his jejunum to ensure everything is looking well and take biopsies if necessary to further define the patient's nausea.  The risks and benefits of endoscopic evaluation were discussed with the patient; these include but are not limited to the risk of perforation, infection, bleeding, missed lesions, lack of diagnosis, severe illness requiring hospitalization, as well as anesthesia and sedation related illnesses.  The patient and/or family is agreeable to proceed.  All patient questions were answered to the best of my ability, and the patient agrees to the aforementioned plan of action with follow-up as indicated.   PLAN  Proceed with scheduled EGD with fluoroscopy for  stent removal versus upsizing next week Will maintain prolonged therapy for patient Colon cancer screening due in 2026 (recall to be placed in system)   No orders of the defined types were placed in this encounter.   New Prescriptions   No medications on file   Modified Medications   No medications on file    Planned Follow Up No follow-ups on file.    Total Time in Face-to-Face and in Coordination of Care for patient including independent/personal interpretation/review of prior testing, medical history, examination, medication adjustment, communicating results with the patient directly, and documentation with the EHR is 25 minutes.  Justice Britain, MD [facility] # [id number]

## 2021-07-03 NOTE — H&P (View-Only) (Signed)
[place] VISIT   Primary Care Provider [name], MD [address] [place] [address] [phone]  Referring Provider Dr. Kieth Brightly   Patient Profile: Hector Curry is a 60 y.o. 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  The patient presents to the [facility] for an evaluation and management of problem(s) noted below:  Problem List 1. Gastrojejunal anastomotic stricture   2. Gastric outlet obstruction   3. Esophageal dysphagia   4. History of Roux-en-Y gastric bypass   5. Antiplatelet or antithrombotic long-term use     History of Present Illness Please see prior progress notes for full details of HPI.  Interval History The patient returns for scheduled follow-up with his wife.  He has done exceedingly well with our AXIOS stent.  He has been able to transition to oral intake by mouth.  He has been losing weight pretty significantly so is still utilizing tube feeds during the course of the day for supplementation.  He has noted over the course of the last few days some increased issues with nausea for which she has began to use a previously prescribed antiemetic but they are not clear of but will get back to [place] as to which it is.  Bowels are moving regularly.  The patient and wife were able to enjoy their son's wedding without further spit up/spit bag availability.  No significant hematemesis or coffee-ground emesis.  GI Review of Systems Positive as above Negative for dysphagia, odynophagia, bloating, alteration of bowel habits,  melena, hematochezia   Review of Systems General: Denies fevers/chills Cardiovascular: Denies chest pain/palpitations Pulmonary: Denies shortness of breath Gastroenterological: See HPI Genitourinary: Denies darkened urine Hematological: Positive for easy bruising/bleeding due to antiplatelet therapy Dermatological: Denies jaundice Psychological: Mood is stable   Medications Current Outpatient Medications  Medication Sig Dispense Refill   Acetaminophen (TYLENOL PO) 1,000 mg by Feeding Tube route every 6 (six) hours as needed (pain). Tylenol 500mg /51ml     aspirin 81 MG chewable tablet 81 mg daily. Per tube     atorvastatin (LIPITOR) 40 MG tablet Place 1 tablet (40 mg total) into feeding tube daily at 6 PM as directed. 30 tablet 11   cetirizine HCl (ZYRTEC) 1 MG/ML solution Take 10 mLs (10 mg total) by mouth once daily (Patient taking differently: Take 5 mg by mouth daily.) 300 mL 11   cetirizine HCl (ZYRTEC) 1 MG/ML solution Take 10 mLs (10 mg total) by mouth once daily 300 mL 11   Continuous Blood Gluc Sensor (FREESTYLE LIBRE 2 SENSOR) MISC Use as directed every 14 days 10 each 1   diltiazem (CARDIZEM) 10 mg/ml SOLN oral suspension Place 6 mLs (60 mg total) into feeding tube every 8 (eight) hours as needed. 270 mL 1   fluticasone (FLONASE) 50 MCG/ACT nasal spray Place 1 spray into both nostrils daily as needed for allergies or rhinitis.     insulin lispro (HUMALOG) 100 UNIT/ML KwikPen Inject 0-20 Units into the skin 3 (three) times daily with meals. (according to sliding scale) (Patient taking differently: Inject 3-4 Units into the skin 3 (three) times daily as needed (high blood sugar).) 15 mL 2   Insulin Pen Needle 32G  X 4 MM MISC Use as needed as directed 100 each 3   Melatonin 5 MG TBDP 5 mg by Feeding Tube route at bedtime as needed (sleep).     Metoprolol Tartrate (FIRST - METOPROLOL) 10 MG/ML SOLN Take 5 mls via g-tube 2 times a day **refrigerate** 900 mL 3   nitroGLYCERIN  (NITROSTAT) 0.4 MG SL tablet Place 1 tablet (0.4 mg total) under the tongue every 5 (five) minutes as needed for chest pain. 25 tablet 3   Nutritional Supplements (FEEDING SUPPLEMENT, OSMOLITE 1.2 CAL,) LIQD Place 1,000 mLs into feeding tube continuous. -Begin with 50 ml Osmolite 1.2 via G-tube every 2 hours (8 feedings total while awake) -Free water flush with 30 ml before and after each bolus As tolerated work up to 100 ml Osmolite 1.2 7 times daily (~every 2 hours). -Continue Prosource TF BID. -3 cartons total of Osmolite 1.2 + Prosource TF BID provides 935 kcals (66% of needs), 61g protein (77% of needs), 112g CHO and 1005 ml H2O. -Free water of 240 ml TID via G-tube or PO -Will meet rest of needs with other liquids/diet and protein supplements (Patient taking differently: Place 237 mLs into feeding tube 3 (three) times daily.) 1000 mL 20   Omeprazole 20 MG TBDD 20 mg by Feeding Tube route daily.     OVER THE COUNTER MEDICATION 30 mLs by Feeding Tube route daily. Bariatric liquid multivitamin     OVER THE COUNTER MEDICATION 30 mLs by Feeding Tube route daily. Bariatric b complex liquid     potassium chloride 20 MEQ/15ML (10%) SOLN Dilute 15 mls of solution (20 mEq total) as directed and place into feeding tube 2 (two) times daily. 473 mL 2   PRESCRIPTION MEDICATION at bedtime. Bipap     Propylene Glycol (SYSTANE BALANCE OP) Place 1 drop into both eyes daily as needed (dry eyes).     protein supplement (RESOURCE BENEPROTEIN) POWD Take 1 Scoop by mouth in the morning and at bedtime.     SODIUM FLUORIDE 5000 SENSITIVE 1.1-5 % GEL Place 1 application onto teeth daily.     ticagrelor (BRILINTA) 90 MG TABS tablet Place 1 tablet (90 mg total) into feeding tube 2 (two) times daily. 60 tablet 2   Water For Irrigation, Sterile (FREE WATER) SOLN Place 60 mLs into feeding tube every 8 (eight) hours.     No current facility-administered medications for this visit.    Allergies Allergies  Allergen  Reactions   Other Other (See Comments)    Cats    Histories Past Medical History:  Diagnosis Date   Allergic rhinitis    Atrial fibrillation (HCC)    Coronary artery disease, non-occlusive    a. 05/08/2015 Cath: no significant CAD, LVEF nl-->Med; b. 07/2017 MV: attenuation artifact, no ischemia, EF 65%-->Low risk.   Diabetes mellitus without complication (HCC)    Diastolic dysfunction    a. 04/2015 Echo: EF 60-65%, mild MR, mildly dil LA, nl RV fxn, nl PASP; b. 05/2016 Echo: EF 60-65%, no rmwa, Gr1 DD, mildly dil Ao root and asc Ao; c. 07/2017 Echo: EF 55-60%, Ao root 20mm. mildly dil LA; d. 03/2019 Echo: EF 55-60%, nl RV fxn. Mild LAE, Ao root/Asc Ao 7mm.   Dilated aortic root (HCC)    a. 07/2017 Echo: 54mm Ao root - mildly dil; b. 03/2019 Echo: Ao root 59mm.   Diverticulosis 10/03/2014   Dysrhythmia    Family history of premature CAD    a. father passed from MI at 35  GERD (gastroesophageal reflux disease)    Hemorrhoids    a. internal hemorrhoids s/p surgery 1999   History of kidney stones    History of tobacco abuse    Hyperlipidemia    Hyperplastic colon polyp 10/03/2014   a. x 2    Hypertension    Inflammatory arthritis    a. CCP antibodies & x-rays negative. Rheumatoid factor 14, felt to be crystaline over RA or psoriatic   Morbid obesity (HCC)    a. s/p LAP-BAND   OSA (obstructive sleep apnea)    a. on CPAP   Osteoarthritis    PSVT (paroxysmal supraventricular tachycardia) (HCC)    a. 48 hr Holter 04/2015: NSR w/ rare PVC, short runs of narrow complex tachycardiac, possible atrial tach, longest run 7 beats, PACs noted (2% of all beats 3600 total) they did not seem to correlate w/ significant arrythmia; b. 08/2017 Event monitor: no significant arrhythmias.   Past Surgical History:  Procedure Laterality Date   BARIATRIC SURGERY     lap band    CARDIAC CATHETERIZATION N/A 05/08/2015   Procedure: Left Heart Cath and Coronary Angiography;  Surgeon: Jayadeep S Varanasi, MD;   Location: [facility];  Service: Cardiovascular;  Laterality: N/A;   CARPAL TUNNEL RELEASE Left    CHOLECYSTECTOMY     COLONOSCOPY  06/27/2005   COLONOSCOPY  10/03/2014   CORONARY STENT INTERVENTION N/A 04/15/2021   Procedure: CORONARY STENT INTERVENTION;  Surgeon: Berry, Jonathan J, MD;  Location: [facility];  Service: Cardiovascular;  Laterality: N/A;   ESOPHAGEAL DILATION  05/27/2021   Procedure: ESOPHAGEAL DILATION;  Surgeon: Mansouraty, Linda Grimmer Jr., MD;  Location: [facility];  Service: Gastroenterology;;   ESOPHAGEAL STENT PLACEMENT N/A 05/27/2021   Procedure: ESOPHAGEAL STENT PLACEMENT;  Surgeon: Mansouraty, Uziah Sorter Jr., MD;  Location: [facility];  Service: Gastroenterology;  Laterality: N/A;   ESOPHAGOGASTRODUODENOSCOPY  06/27/2005   ESOPHAGOGASTRODUODENOSCOPY N/A 03/19/2021   Procedure: ESOPHAGOGASTRODUODENOSCOPY (EGD);  Surgeon: Kinsinger, Luke Aaron, MD;  Location: [facility];  Service: General;  Laterality: N/A;   ESOPHAGOGASTRODUODENOSCOPY (EGD) WITH PROPOFOL N/A 05/27/2021   Procedure: ESOPHAGOGASTRODUODENOSCOPY (EGD) WITH PROPOFOL;  Surgeon: Mansouraty, Jimmy Stipes Jr., MD;  Location: [facility];  Service: Gastroenterology;  Laterality: N/A;   GASTRIC ROUX-EN-Y N/A 02/18/2021   Procedure: LAPAROSCOPIC ROUX-EN-Y GASTRIC BYPASS WITH UPPER ENDOSCOPY,;  Surgeon: Kinsinger, Luke Aaron, MD;  Location: [facility];  Service: General;  Laterality: N/A;   HEMORRHOID SURGERY  08/25/1997   IR GJ TUBE CHANGE  03/27/2021   LAPAROSCOPIC INSERTION GASTROSTOMY TUBE N/A 03/20/2021   Procedure: LAPAROSCOPIC INSERTION GASTROSTOMY TUBE;  Surgeon: Kinsinger, Luke Aaron, MD;  Location: [facility];  Service: General;  Laterality: N/A;   LEFT HEART CATH AND CORONARY ANGIOGRAPHY N/A 04/15/2021   Procedure: LEFT HEART CATH AND CORONARY ANGIOGRAPHY;  Surgeon: Berry, Jonathan J, MD;  Location: [facility];  Service: Cardiovascular;  Laterality: N/A;   MOUTH SURGERY     removed area which was  benign   TONSILLECTOMY     UPPER GI ENDOSCOPY N/A 03/20/2021   Procedure: UPPER GI ENDOSCOPY;  Surgeon: Kinsinger, Luke Aaron, MD;  Location: [facility];  Service: General;  Laterality: N/A;   Social History   Socioeconomic History   Marital status: Married    Spouse name: Not on file   Number of children: Not on file   Years of education: Not on file   Highest education level: Not on file  Occupational History   Not on file  Tobacco Use     Smoking status: Former    Packs/day: 1.00    Years: 15.00    Pack years: 15.00    Types: Cigarettes    Quit date: 08/05/1989    Years since quitting: 31.9   Smokeless tobacco: Never  Vaping Use   Vaping Use: Never used  Substance and Sexual Activity   Alcohol use: Never   Drug use: Never   Sexual activity: Not on file  Other Topics Concern   Not on file  Social History Narrative   Not on file   Social Determinants of Health   Financial Resource Strain: Not on file  Food Insecurity: Not on file  Transportation Needs: Not on file  Physical Activity: Not on file  Stress: Not on file  Social Connections: Not on file  Intimate Partner Violence: Not on file   Family History  Problem Relation Age of Onset   Hypertension Mother    Diabetes Mother    Heart attack Father 3   CAD Father    Hypertension Sister    Diabetes Other    Hypertension Other    Heart disease Other    Colon cancer Neg Hx    Esophageal cancer Neg Hx    Pancreatic cancer Neg Hx    Stomach cancer Neg Hx    Liver disease Neg Hx    Inflammatory bowel disease Neg Hx    Rectal cancer Neg Hx    I have reviewed his medical, social, and family history in detail and updated the electronic medical record as necessary.    PHYSICAL EXAMINATION  BP 100/60   Pulse 79   Ht 6\' 2"  (1.88 m)   Wt 245 lb (111.1 kg)   BMI 31.46 kg/m  Wt Readings from Last 3 Encounters:  07/03/21 245 lb (111.1 kg)  05/27/21 250 lb 10.6 oz (113.7 kg)  05/23/21 250 lb 9.6 oz (113.7 kg)   GEN: NAD, appears stated age, doesn't appear chronically ill, accompanied by wife, with spit bag passing saliva every few minutes PSYCH: Cooperative, without pressured speech EYE: Conjunctivae pink, sclerae anicteric ENT: MMM, without oral ulcers CV: RR without R/Gs  RESP: No wheezing GI: NABS, soft, protuberant abdomen, surgical scars present, G-tube in place, NT, without rebound MSK/EXT: Lower extremity edema present SKIN: No jaundice NEURO:  Alert & Oriented x 3, no focal deficits   REVIEW OF DATA  I reviewed the following data at the time of this encounter:  GI Procedures and Studies  October 2022 EGD - Fluid and foodstuffs were found in the mid esophagus and in the distal esophagus - removed. - No gross mucosal lesions in esophagus. - Salmon-colored mucosa suspicious for Barrett's esophagus distally. - Z-line irregular, 48 cm from the incisors. - Staples were found in the esophagus distally. - A gastric bypass was found and the anastomosis was characterized by severe stenosis, an intact staple line, friable mucosa, inflammation, edema and erythema. Injected contrast to delineate the length of the stricture and then AXIOS LAMS prosthesis placed. Dilated slightly to not increase risk of bleeding significantly.  Laboratory Studies  Reviewed those in epic and care everywhere  Imaging Studies  October 2022 KUB/chest x-ray IMPRESSION: 1. Nonobstructive bowel gas pattern. 2. No soft esophageal stent identified. 3. Left upper quadrant gastrostomy tube again seen.  IMPRESSION: 1.  No radiographic evidence of acute cardiopulmonary disease. 2. No esophageal stent confidently identified.  Please see prior progress notes for full details of HPI.   ASSESSMENT  Hector Curry is a 60 y.o.  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  The patient is seen today for evaluation and management of:  1. Gastrojejunal anastomotic stricture   2. Gastric outlet obstruction   3. Esophageal dysphagia   4. History of Roux-en-Y gastric bypass   5. Antiplatelet or antithrombotic long-term use    The patient is clinically and hemodynamically stable.  He has done very well with AXIOS stenting of his gastrojejunal anastomosis stricture.  He is scheduled for stent removal next week.  I will determine at that time whether I tried to increase his stent size up to 20 mm to further allow dilation of this area versus allowing the patient to just see how he does with the 50 mm expansion.  There is an increased risk of bleeding since he will be remaining on Brilinta and aspirin but with the amount of time that this has been in place and I think that will be less likely.  Time will tell.  We will defer his continued use of tube feeding to his primary bariatric surgeon.  The patient and wife would let us know what antiemetic he is taking at this time.  We will evaluate his jejunum to ensure everything is looking well and take biopsies if necessary to further define the patient's nausea.  The risks and benefits of endoscopic evaluation were discussed with the patient; these include but are not limited to the risk of perforation, infection, bleeding, missed lesions, lack of diagnosis, severe illness requiring hospitalization, as well as anesthesia and sedation related illnesses.  The patient and/or family is agreeable to proceed.  All patient questions were answered to the best of my ability, and the patient agrees to the aforementioned plan of action with follow-up as indicated.   PLAN  Proceed with scheduled EGD with fluoroscopy for  stent removal versus upsizing next week Will maintain prolonged therapy for patient Colon cancer screening due in 2026 (recall to be placed in system)   No orders of the defined types were placed in this encounter.   New Prescriptions   No medications on file   Modified Medications   No medications on file    Planned Follow Up No follow-ups on file.    Total Time in Face-to-Face and in Coordination of Care for patient including independent/personal interpretation/review of prior testing, medical history, examination, medication adjustment, communicating results with the patient directly, and documentation with the EHR is 25 minutes.  Justice Britain, MD [facility] # [phone]

## 2021-07-06 NOTE — Anesthesia Preprocedure Evaluation (Addendum)
Anesthesia Evaluation  Patient identified by MRN, date of birth, ID band Patient awake    Reviewed: Allergy & Precautions, NPO status , Patient's Chart, lab work & pertinent test results, reviewed documented beta blocker date and time   History of Anesthesia Complications Negative for: history of anesthetic complications  Airway Mallampati: III  TM Distance: <3 FB Neck ROM: Full    Dental no notable dental hx. (+) Teeth Intact, Dental Advisory Given, Poor Dentition,    Pulmonary neg pulmonary ROS, sleep apnea and Continuous Positive Airway Pressure Ventilation , former smoker,    Pulmonary exam normal breath sounds clear to auscultation       Cardiovascular hypertension, Pt. on medications and Pt. on home beta blockers + CAD and + Cardiac Stents  negative cardio ROS Normal cardiovascular exam+ dysrhythmias Atrial Fibrillation and Supra Ventricular Tachycardia  Rhythm:Regular Rate:Normal  TTE 2020: EF 55-60%, mild LAE, mild to moderate dilatation of aortic root and ascending aorta measuring 42 mm   Neuro/Psych negative neurological ROS  negative psych ROS   GI/Hepatic negative GI ROS, Neg liver ROS, GERD  Medicated,S/P Lap Gastric band with removal and conversion to Roux-n-y gastric bypass  Gastroesophageal stricture post lap gastric band   Endo/Other  negative endocrine ROSdiabetes, Poorly Controlled, Type 2Morbid obesityHx/o morbid obesity  Renal/GU Renal diseasenegative Renal ROSHx/o renal calculi  negative genitourinary   Musculoskeletal negative musculoskeletal ROS (+) Arthritis ,   Abdominal   Peds negative pediatric ROS (+)  Hematology negative hematology ROS (+)   Anesthesia Other Findings   Reproductive/Obstetrics negative OB ROS                            Anesthesia Physical  Anesthesia Plan  ASA: 3  Anesthesia Plan: MAC   Post-op Pain Management:    Induction:  Intravenous  PONV Risk Score and Plan: 2 and Treatment may vary due to age or medical condition  Airway Management Planned: Nasal Cannula, Natural Airway and Simple Face Mask  Additional Equipment: None  Intra-op Plan:   Post-operative Plan:   Informed Consent: I have reviewed the patients History and Physical, chart, labs and discussed the procedure including the risks, benefits and alternatives for the proposed anesthesia with the patient or authorized representative who has indicated his/her understanding and acceptance.     Dental advisory given  Plan Discussed with: Anesthesiologist and CRNA  Anesthesia Plan Comments:        Anesthesia Quick Evaluation

## 2021-07-08 ENCOUNTER — Ambulatory Visit (HOSPITAL_COMMUNITY): Payer: No Typology Code available for payment source | Admitting: Anesthesiology

## 2021-07-08 ENCOUNTER — Other Ambulatory Visit (HOSPITAL_COMMUNITY): Payer: Self-pay

## 2021-07-08 ENCOUNTER — Encounter (HOSPITAL_COMMUNITY)
Admission: RE | Disposition: A | Discharge status: Home / Self Care | Payer: Self-pay | Source: Home / Self Care | Attending: Gastroenterology

## 2021-07-08 ENCOUNTER — Other Ambulatory Visit: Payer: Self-pay

## 2021-07-08 ENCOUNTER — Encounter (HOSPITAL_COMMUNITY): Payer: Self-pay | Admitting: Gastroenterology

## 2021-07-08 ENCOUNTER — Ambulatory Visit (HOSPITAL_COMMUNITY)
Admission: RE | Admit: 2021-07-08 | Discharge: 2021-07-08 | Disposition: A | Discharge status: Home / Self Care | Payer: No Typology Code available for payment source | Attending: Gastroenterology | Admitting: Gastroenterology

## 2021-07-08 DIAGNOSIS — E119 Type 2 diabetes mellitus without complications: Secondary | ICD-10-CM | POA: Insufficient documentation

## 2021-07-08 DIAGNOSIS — I252 Old myocardial infarction: Secondary | ICD-10-CM | POA: Diagnosis not present

## 2021-07-08 DIAGNOSIS — Z4659 Encounter for fitting and adjustment of other gastrointestinal appliance and device: Secondary | ICD-10-CM | POA: Diagnosis not present

## 2021-07-08 DIAGNOSIS — K9189 Other postprocedural complications and disorders of digestive system: Secondary | ICD-10-CM | POA: Insufficient documentation

## 2021-07-08 DIAGNOSIS — Z9884 Bariatric surgery status: Secondary | ICD-10-CM | POA: Insufficient documentation

## 2021-07-08 DIAGNOSIS — I110 Hypertensive heart disease with heart failure: Secondary | ICD-10-CM | POA: Insufficient documentation

## 2021-07-08 DIAGNOSIS — Z7982 Long term (current) use of aspirin: Secondary | ICD-10-CM | POA: Diagnosis not present

## 2021-07-08 DIAGNOSIS — Z09 Encounter for follow-up examination after completed treatment for conditions other than malignant neoplasm: Secondary | ICD-10-CM

## 2021-07-08 DIAGNOSIS — Z6831 Body mass index (BMI) 31.0-31.9, adult: Secondary | ICD-10-CM | POA: Insufficient documentation

## 2021-07-08 DIAGNOSIS — Z7902 Long term (current) use of antithrombotics/antiplatelets: Secondary | ICD-10-CM | POA: Insufficient documentation

## 2021-07-08 DIAGNOSIS — Z955 Presence of coronary angioplasty implant and graft: Secondary | ICD-10-CM | POA: Insufficient documentation

## 2021-07-08 DIAGNOSIS — Z79899 Other long term (current) drug therapy: Secondary | ICD-10-CM | POA: Insufficient documentation

## 2021-07-08 DIAGNOSIS — I509 Heart failure, unspecified: Secondary | ICD-10-CM | POA: Insufficient documentation

## 2021-07-08 DIAGNOSIS — Z9049 Acquired absence of other specified parts of digestive tract: Secondary | ICD-10-CM | POA: Diagnosis not present

## 2021-07-08 DIAGNOSIS — R110 Nausea: Secondary | ICD-10-CM | POA: Insufficient documentation

## 2021-07-08 DIAGNOSIS — G4733 Obstructive sleep apnea (adult) (pediatric): Secondary | ICD-10-CM | POA: Insufficient documentation

## 2021-07-08 DIAGNOSIS — Z87891 Personal history of nicotine dependence: Secondary | ICD-10-CM | POA: Insufficient documentation

## 2021-07-08 DIAGNOSIS — I2510 Atherosclerotic heart disease of native coronary artery without angina pectoris: Secondary | ICD-10-CM | POA: Diagnosis not present

## 2021-07-08 DIAGNOSIS — R1310 Dysphagia, unspecified: Secondary | ICD-10-CM | POA: Diagnosis present

## 2021-07-08 DIAGNOSIS — K222 Esophageal obstruction: Secondary | ICD-10-CM

## 2021-07-08 HISTORY — PX: BIOPSY: SHX5522

## 2021-07-08 HISTORY — PX: STENT REMOVAL: SHX6421

## 2021-07-08 HISTORY — PX: ESOPHAGOGASTRODUODENOSCOPY (EGD) WITH PROPOFOL: SHX5813

## 2021-07-08 LAB — GLUCOSE, CAPILLARY: Glucose-Capillary: 115 mg/dL — ABNORMAL HIGH (ref 70–99)

## 2021-07-08 SURGERY — ESOPHAGOGASTRODUODENOSCOPY (EGD) WITH PROPOFOL
Anesthesia: Monitor Anesthesia Care

## 2021-07-08 MED ORDER — PROPOFOL 10 MG/ML IV BOLUS
INTRAVENOUS | Status: DC | PRN
  Administered 2021-07-08 (×2): 40 mg via INTRAVENOUS

## 2021-07-08 MED ORDER — PROPOFOL 500 MG/50ML IV EMUL
INTRAVENOUS | Status: AC
  Filled 2021-07-08: qty 50

## 2021-07-08 MED ORDER — PHENYLEPHRINE HCL (PRESSORS) 10 MG/ML IV SOLN
INTRAVENOUS | Status: DC | PRN
  Administered 2021-07-08: 80 ug via INTRAVENOUS

## 2021-07-08 MED ORDER — SODIUM CHLORIDE 0.9 % IV SOLN: INTRAVENOUS | Status: DC

## 2021-07-08 MED ORDER — LACTATED RINGERS IV SOLN: INTRAVENOUS | Status: DC

## 2021-07-08 MED ORDER — PROPOFOL 1000 MG/100ML IV EMUL
INTRAVENOUS | Status: AC
  Filled 2021-07-08: qty 100

## 2021-07-08 MED ORDER — EPHEDRINE SULFATE-NACL 50-0.9 MG/10ML-% IV SOSY
PREFILLED_SYRINGE | INTRAVENOUS | Status: DC | PRN
  Administered 2021-07-08 (×4): 5 mg via INTRAVENOUS

## 2021-07-08 MED ORDER — ONDANSETRON HCL 4 MG/2ML IJ SOLN
INTRAMUSCULAR | Status: DC | PRN
  Administered 2021-07-08: 4 mg via INTRAVENOUS

## 2021-07-08 MED ORDER — OMEPRAZOLE 20 MG PO TBDD
20.0000 mg | DELAYED_RELEASE_TABLET | Freq: Two times a day (BID) | ORAL | 6 refills | Status: DC
Start: 2021-07-08 — End: 2021-08-17
  Filled 2021-07-08: qty 60, fill #0

## 2021-07-08 MED ORDER — SUCRALFATE 1 GM/10ML PO SUSP
1.0000 g | Freq: Two times a day (BID) | ORAL | 1 refills | Status: DC
  Filled 2021-07-08 (×2): qty 420, 21d supply, fill #0

## 2021-07-08 MED ORDER — PROPOFOL 1000 MG/100ML IV EMUL
INTRAVENOUS | Status: AC
  Filled 2021-07-08: qty 300

## 2021-07-08 MED ORDER — LIDOCAINE 2% (20 MG/ML) 5 ML SYRINGE
INTRAMUSCULAR | Status: DC | PRN
Start: 2021-07-08 — End: 2021-07-08
  Administered 2021-07-08: 80 mg via INTRAVENOUS

## 2021-07-08 MED ORDER — SUCRALFATE 1 G PO TABS
1.0000 g | ORAL_TABLET | Freq: Two times a day (BID) | ORAL | 0 refills | Status: DC
  Filled 2021-07-08: qty 3, 2d supply, fill #0
  Filled 2021-07-08: qty 57, 28d supply, fill #0

## 2021-07-08 MED ORDER — PROPOFOL 500 MG/50ML IV EMUL
INTRAVENOUS | Status: DC | PRN
  Administered 2021-07-08: 150 ug/kg/min via INTRAVENOUS

## 2021-07-08 SURGICAL SUPPLY — 15 items

## 2021-07-08 NOTE — Anesthesia Postprocedure Evaluation (Signed)
Anesthesia Post Note  Patient: Hector Curry  Procedure(s) Performed: ESOPHAGOGASTRODUODENOSCOPY (EGD) WITH PROPOFOL STENT REMOVAL BIOPSY     Patient location during evaluation: PACU Anesthesia Type: MAC Level of consciousness: awake and alert Pain management: pain level controlled Vital Signs Assessment: post-procedure vital signs reviewed and stable Respiratory status: spontaneous breathing, nonlabored ventilation, respiratory function stable and patient connected to nasal cannula oxygen Cardiovascular status: stable and blood pressure returned to baseline Postop Assessment: no apparent nausea or vomiting Anesthetic complications: no   No notable events documented.  Last Vitals:  Vitals:   07/08/21 0828 07/08/21 0830  BP: (!) 91/40 (!) 91/40  Pulse: (!) 58 (!) 56  Resp: 16 17  Temp:    SpO2: 96% 95%    Last Pain:  Vitals:   07/08/21 0830  TempSrc:   PainSc: 0-No pain                 [name]

## 2021-07-08 NOTE — Interval H&P Note (Signed)
History and Physical Interval Note:  07/08/2021 7:37 AM  Hector Curry  has presented today for surgery, with the diagnosis of esophageal stricture.  The various methods of treatment have been discussed with the patient and family. After consideration of risks, benefits and other options for treatment, the patient has consented to  Procedure(s) with comments: ESOPHAGOGASTRODUODENOSCOPY (EGD) WITH PROPOFOL (N/A) - fluoro as a surgical intervention.  The patient's history has been reviewed, patient examined, no change in status, stable for surgery.  I have reviewed the patient's chart and labs.  Questions were answered to the patient's satisfaction.     [employer]

## 2021-07-08 NOTE — Transfer of Care (Signed)
Immediate Anesthesia Transfer of Care Note  Patient: Hector Curry  Procedure(s) Performed: ESOPHAGOGASTRODUODENOSCOPY (EGD) WITH PROPOFOL STENT REMOVAL BIOPSY  Patient Location: PACU and Endoscopy Unit  Anesthesia Type:MAC  Level of Consciousness: awake, alert  and oriented  Airway & Oxygen Therapy: Patient Spontanous Breathing and Patient connected to face mask  Post-op Assessment: Report given to RN and Post -op Vital signs reviewed and stable  Post vital signs: Reviewed and stable  Last Vitals:  Vitals Value Taken Time  BP 92/42 07/08/21 0804  Temp    Pulse 63 07/08/21 0805  Resp 19 07/08/21 0805  SpO2 100 % 07/08/21 0805  Vitals shown include unvalidated device data.  Last Pain:  Vitals:   07/08/21 0646  TempSrc: Oral  PainSc: 0-No pain         Complications: No notable events documented.

## 2021-07-08 NOTE — Progress Notes (Signed)
Dr Miguel Rota with anethesia notified of low blood pressure.  He instructed to give pt the remainder of the iv bag (approx 75-100 ml) and then ok to discharge.  Pt's bp has a hx of running on the low side and pt is asymptomatic.

## 2021-07-08 NOTE — Progress Notes (Signed)
CRNA aware of low bp upon arrival to recovery.  Medication given by CRNA to treat.

## 2021-07-08 NOTE — Discharge Instructions (Signed)

## 2021-07-08 NOTE — Op Note (Signed)
[facility] Patient Name: Hector Curry Procedure Date: 07/08/2021 MRN: [id number] Attending MD: Justice Britain , MD Date of Birth: 1960/10/18 CSN: [id number] Age: 60 Admit Type: Outpatient Procedure:                Upper GI endoscopy Indications:              Dysphagia, Stent removal, Stent follow-up, Nausea Providers:                Justice Britain, MD, Kary Kos RN, RN, Cletis Athens, Technician, Lodema Hong Technician,                            Technician Referring MD:             Arta Bruce Kinsinger MD, MD, Leonie Douglas. Doy Hutching, MD Medicines:                Monitored Anesthesia Care Complications:            No immediate complications. Estimated Blood Loss:     Estimated blood loss was minimal. Procedure:                Pre-Anesthesia Assessment:                           - Prior to the procedure, a History and Physical                            was performed, and patient medications and                            allergies were reviewed. The patient's tolerance of                            previous anesthesia was also reviewed. The risks                            and benefits of the procedure and the sedation                            options and risks were discussed with the patient.                            All questions were answered, and informed consent                            was obtained. Prior Anticoagulants: The patient has                            taken Plavix (clopidogrel), last dose was day of                            procedure. ASA Grade Assessment: III - A patient  with severe systemic disease. After reviewing the                            risks and benefits, the patient was deemed in                            satisfactory condition to undergo the procedure.                           After obtaining informed consent, the endoscope was                            passed  under direct vision. Throughout the                            procedure, the patient's blood pressure, pulse, and                            oxygen saturations were monitored continuously. The                            [id number]-[id number] ([id number]) Olympus therapeutic endoscope                            was introduced through the mouth, and advanced to                            the jejunum. The upper GI endoscopy was                            accomplished without difficulty. The patient                            tolerated the procedure. Scope In: Scope Out: Findings:      No gross lesions were noted in the entire esophagus.      The Z-line was irregular and was found 46 cm from the incisors.      The cardia was normal in mucosal appearance.      An AXIOS LAMS metal stent was found at the anastomosis - there was       evidence of some tissue ingrowth. Stent removal was accomplished with a       rat-toothed forceps.      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 This       area oozed from some tissue ingrowth that had occurred from the       previously placed stent. This area was able to be easily intubated now..      Normal mucosa was found in the jejunum. Biopsies were taken with a cold       forceps for histology. Impression:               - No gross lesions in esophagus. Z-line irregular,  46 cm from the incisors.                           - Normal cardia.                           - Pre-existing AXIOS LAMS stent at anastomosis with                            evidence of tissue ingrowth present - removed.                           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                           - Normal mucosa was found in the jejunum. Biopsied                            to rule out enteropathy. Moderate Sedation:      Not Applicable - Patient had care per Anesthesia. Recommendation:           - The patient will be observed post-procedure,                            until all discharge criteria are met.                           - Discharge patient to home.                           - Patient has a contact number available for                            emergencies. The signs and symptoms of potential                            delayed complications were discussed with the                            patient. Return to normal activities tomorrow.                            Written discharge instructions were provided to the                            patient.                           -  Increase to Omeprazole 20 mg twice daily for next                            2 months.                           - Start Carafate 1 g BID for 1 month (until                            prescription and 1 refill is complete) then may                            stop.                           - Observe patient's clinical course.                           - Await pathology results.                           - Follow up in clinic in 6-8 weeks.                           - Patient will discuss surveillance/screening                            colonoscopy at follow up.                           - The findings and recommendations were discussed                            with the patient.                           - The findings and recommendations were discussed                            with the patient's family. Procedure Code(s):        --- Professional ---                           [phone], Esophagogastroduodenoscopy, flexible,                            transoral; with removal of foreign body(s)                           43239, Esophagogastroduodenoscopy,  flexible,                            transoral; with biopsy, single or multiple Diagnosis Code(s):        --- Professional ---                           K22.8, Other specified diseases  of esophagus                           Z97.8, Presence of other specified devices                           Z98.84, Bariatric surgery status                           R13.10, Dysphagia, unspecified                           Z46.59, Encounter for fitting and adjustment of                            other gastrointestinal appliance and device                           Z09, Encounter for follow-up examination after                            completed treatment for conditions other than                            malignant neoplasm                           R11.0, Nausea CPT copyright 2019 American [employer] Association. All rights reserved. The codes documented in this report are preliminary and upon coder review may  be revised to meet current compliance requirements. Justice Britain, MD 07/08/2021 8:14:19 AM Number of Addenda: 0

## 2021-07-09 ENCOUNTER — Other Ambulatory Visit (HOSPITAL_COMMUNITY): Payer: Self-pay

## 2021-07-09 ENCOUNTER — Telehealth: Payer: Self-pay | Admitting: Cardiovascular Disease

## 2021-07-09 ENCOUNTER — Encounter (HOSPITAL_COMMUNITY): Payer: Self-pay | Admitting: Gastroenterology

## 2021-07-09 ENCOUNTER — Other Ambulatory Visit: Payer: Self-pay

## 2021-07-09 LAB — SURGICAL PATHOLOGY

## 2021-07-09 MED ORDER — ATORVASTATIN CALCIUM 40 MG PO TABS
40.0000 mg | ORAL_TABLET | Freq: Every day | ORAL | 0 refills | Status: DC
  Filled 2021-07-09: qty 90, 90d supply, fill #0

## 2021-07-09 NOTE — Telephone Encounter (Signed)
Can you please advise.  Patient is requesting pill form of this medication but it looks like he has a G tube.

## 2021-07-09 NOTE — Telephone Encounter (Signed)
*  STAT* If patient is at the pharmacy, call can be transferred to refill team.   1. Which medications need to be refilled? (please list name of each medication and dose if known)  atorvastatin 40 MG 1 tablet daily **Patient is requesting it in pill form and not liquid**   2. Which pharmacy/location (including street and city if local pharmacy) is medication to be sent to? [facility]   3. Do they need a 30 day or 90 day supply? 90 day

## 2021-07-09 NOTE — Telephone Encounter (Signed)
Disp Refills Start End   atorvastatin (LIPITOR) 40 MG tablet 90 tablet 0 07/09/2021 10/07/2021   Sig - Route: Take 1 tablet (40 mg total) by mouth daily. - Oral   Sent to pharmacy as: atorvastatin (LIPITOR) 40 MG tablet    Pharmacy  [facility]

## 2021-07-10 ENCOUNTER — Other Ambulatory Visit: Payer: Self-pay

## 2021-07-10 ENCOUNTER — Encounter: Payer: Self-pay | Admitting: Gastroenterology

## 2021-07-10 DIAGNOSIS — R1319 Other dysphagia: Secondary | ICD-10-CM

## 2021-07-10 NOTE — Telephone Encounter (Signed)
I called and spoke with the patient this morning.  Unfortunately, it seems like he has developed significant issues post AXIOS stent removal.  At this point, I think it is wise for [place] to dial back his diet and go back to liquids and pured meals for the next few days.  Hopefully this is inflammation driven as the therapeutic upper endoscope was easily able to traverse the area after the AXIOS had been removed. Patient will see how he does over the course the next few days. If by early next week he is still having issues, and I likely will need to perform a repeat endoscopy and try and replace the stent with either another 15 mm or 20 mm AXIOS. Time will tell. Patient will move forward with this plan of action.  [name] or covering RN, please place an EGD case request for 11/23 at 7:30 AM (they are holding a slot for me at that time if we need it). We will get more information from the patient next week and determine whether we continue to keep this procedure or not.  He is on Brilinta and we would plan to keep him on Brilinta.  Thanks. GM

## 2021-07-11 ENCOUNTER — Telehealth: Payer: Self-pay

## 2021-07-11 NOTE — Telephone Encounter (Signed)
Scheduled patient for EGD w/fluoroscopy at [facility] on 07/17/21. Patient to arrive at 6:00 am. Sent instruction letter vis MyChart and also called patient.

## 2021-07-12 ENCOUNTER — Telehealth: Payer: Self-pay | Admitting: Cardiovascular Disease

## 2021-07-12 ENCOUNTER — Encounter (HOSPITAL_COMMUNITY): Payer: Self-pay | Admitting: Gastroenterology

## 2021-07-12 ENCOUNTER — Other Ambulatory Visit: Payer: Self-pay

## 2021-07-12 ENCOUNTER — Other Ambulatory Visit (HOSPITAL_COMMUNITY): Payer: Self-pay

## 2021-07-12 MED ORDER — TICAGRELOR 90 MG PO TABS
90.0000 mg | ORAL_TABLET | Freq: Two times a day (BID) | ORAL | 0 refills | Status: DC
Start: 2021-07-12 — End: 2021-08-17
  Filled 2021-07-12: qty 180, 90d supply, fill #0

## 2021-07-12 NOTE — Telephone Encounter (Signed)
ticagrelor (BRILINTA) 90 MG TABS tablet 180 tablet 0 07/12/2021 10/10/2021   Sig - Route: Place 1 tablet (90 mg total) into feeding tube 2 (two) times daily. - Per Tube   Sent to pharmacy as: ticagrelor (BRILINTA) 90 MG Tab tablet    Pharmacy  [facility]

## 2021-07-12 NOTE — Telephone Encounter (Signed)
*  STAT* If patient is at the pharmacy, call can be transferred to refill team.   1. Which medications need to be refilled? (please list name of each medication and dose if known) ticagrelor (BRILINTA) 90 MG 1 tablet 2 times daily   2. Which pharmacy/location (including street and city if local pharmacy) is medication to be sent to? [facility]   3. Do they need a 30 day or 90 day supply? 90 day

## 2021-07-13 ENCOUNTER — Other Ambulatory Visit (HOSPITAL_COMMUNITY): Payer: Self-pay

## 2021-07-15 ENCOUNTER — Encounter: Payer: Self-pay | Admitting: Gastroenterology

## 2021-07-15 NOTE — Telephone Encounter (Signed)
As patient is doing better we will cancel his endoscopy which is scheduled for this Wednesday.   Corliss Parish, MD [facility] Office # [phone]

## 2021-07-15 NOTE — Telephone Encounter (Signed)
The appt has been cancelled as requested.   

## 2021-07-17 ENCOUNTER — Encounter (HOSPITAL_COMMUNITY): Admission: RE | Payer: Self-pay | Source: Ambulatory Visit

## 2021-07-17 ENCOUNTER — Ambulatory Visit (HOSPITAL_COMMUNITY)
Admission: RE | Admit: 2021-07-17 | Payer: No Typology Code available for payment source | Source: Ambulatory Visit | Admitting: Gastroenterology

## 2021-07-17 SURGERY — EGD (ESOPHAGOGASTRODUODENOSCOPY)
Anesthesia: Monitor Anesthesia Care

## 2021-07-24 ENCOUNTER — Other Ambulatory Visit (HOSPITAL_COMMUNITY): Payer: Self-pay

## 2021-07-25 ENCOUNTER — Other Ambulatory Visit (HOSPITAL_COMMUNITY): Payer: Self-pay

## 2021-08-07 ENCOUNTER — Other Ambulatory Visit: Payer: Self-pay

## 2021-08-07 ENCOUNTER — Encounter: Payer: Self-pay | Admitting: Gastroenterology

## 2021-08-07 DIAGNOSIS — R1319 Other dysphagia: Secondary | ICD-10-CM

## 2021-08-10 ENCOUNTER — Other Ambulatory Visit (HOSPITAL_COMMUNITY): Payer: Self-pay

## 2021-08-14 ENCOUNTER — Other Ambulatory Visit: Payer: Self-pay

## 2021-08-14 ENCOUNTER — Ambulatory Visit (HOSPITAL_BASED_OUTPATIENT_CLINIC_OR_DEPARTMENT_OTHER)
Admission: RE | Admit: 2021-08-14 | Discharge: 2021-08-14 | Disposition: A | Discharge status: Home / Self Care | Payer: No Typology Code available for payment source | Source: Ambulatory Visit | Attending: Gastroenterology | Admitting: Gastroenterology

## 2021-08-14 ENCOUNTER — Encounter: Payer: Self-pay | Admitting: Gastroenterology

## 2021-08-14 ENCOUNTER — Encounter (HOSPITAL_COMMUNITY): Payer: Self-pay | Admitting: Gastroenterology

## 2021-08-14 ENCOUNTER — Ambulatory Visit (HOSPITAL_COMMUNITY): Payer: No Typology Code available for payment source | Admitting: Anesthesiology

## 2021-08-14 ENCOUNTER — Encounter (HOSPITAL_COMMUNITY)
Admission: RE | Disposition: A | Discharge status: Home / Self Care | Payer: Self-pay | Source: Ambulatory Visit | Attending: Gastroenterology

## 2021-08-14 DIAGNOSIS — K3189 Other diseases of stomach and duodenum: Secondary | ICD-10-CM | POA: Insufficient documentation

## 2021-08-14 DIAGNOSIS — R55 Syncope and collapse: Secondary | ICD-10-CM | POA: Diagnosis not present

## 2021-08-14 DIAGNOSIS — E785 Hyperlipidemia, unspecified: Secondary | ICD-10-CM | POA: Insufficient documentation

## 2021-08-14 DIAGNOSIS — I2510 Atherosclerotic heart disease of native coronary artery without angina pectoris: Secondary | ICD-10-CM | POA: Insufficient documentation

## 2021-08-14 DIAGNOSIS — D62 Acute posthemorrhagic anemia: Secondary | ICD-10-CM | POA: Diagnosis not present

## 2021-08-14 DIAGNOSIS — Z87891 Personal history of nicotine dependence: Secondary | ICD-10-CM | POA: Insufficient documentation

## 2021-08-14 DIAGNOSIS — K2289 Other specified disease of esophagus: Secondary | ICD-10-CM | POA: Insufficient documentation

## 2021-08-14 DIAGNOSIS — R1310 Dysphagia, unspecified: Secondary | ICD-10-CM | POA: Insufficient documentation

## 2021-08-14 DIAGNOSIS — K219 Gastro-esophageal reflux disease without esophagitis: Secondary | ICD-10-CM | POA: Insufficient documentation

## 2021-08-14 DIAGNOSIS — G4733 Obstructive sleep apnea (adult) (pediatric): Secondary | ICD-10-CM | POA: Insufficient documentation

## 2021-08-14 DIAGNOSIS — I10 Essential (primary) hypertension: Secondary | ICD-10-CM | POA: Insufficient documentation

## 2021-08-14 DIAGNOSIS — Z9884 Bariatric surgery status: Secondary | ICD-10-CM | POA: Insufficient documentation

## 2021-08-14 DIAGNOSIS — R1319 Other dysphagia: Secondary | ICD-10-CM

## 2021-08-14 DIAGNOSIS — E119 Type 2 diabetes mellitus without complications: Secondary | ICD-10-CM | POA: Insufficient documentation

## 2021-08-14 DIAGNOSIS — I4891 Unspecified atrial fibrillation: Secondary | ICD-10-CM | POA: Insufficient documentation

## 2021-08-14 DIAGNOSIS — Z6829 Body mass index (BMI) 29.0-29.9, adult: Secondary | ICD-10-CM | POA: Insufficient documentation

## 2021-08-14 DIAGNOSIS — Z955 Presence of coronary angioplasty implant and graft: Secondary | ICD-10-CM | POA: Insufficient documentation

## 2021-08-14 HISTORY — PX: BILIARY STENT PLACEMENT: SHX5538

## 2021-08-14 HISTORY — PX: ESOPHAGOGASTRODUODENOSCOPY (EGD) WITH PROPOFOL: SHX5813

## 2021-08-14 HISTORY — PX: BALLOON DILATION: SHX5330

## 2021-08-14 HISTORY — PX: BIOPSY: SHX5522

## 2021-08-14 LAB — GLUCOSE, CAPILLARY: Glucose-Capillary: 121 mg/dL — ABNORMAL HIGH (ref 70–99)

## 2021-08-14 SURGERY — ESOPHAGOGASTRODUODENOSCOPY (EGD) WITH PROPOFOL
Anesthesia: Monitor Anesthesia Care

## 2021-08-14 MED ORDER — LACTATED RINGERS IV SOLN
INTRAVENOUS | Status: AC | PRN
  Administered 2021-08-14: 1000 mL via INTRAVENOUS

## 2021-08-14 MED ORDER — SODIUM CHLORIDE 0.9 % IV SOLN: INTRAVENOUS | Status: DC

## 2021-08-14 MED ORDER — INSULIN ASPART 100 UNIT/ML IJ SOLN: 4.0000 [IU] | Freq: Once | INTRAMUSCULAR | Status: DC

## 2021-08-14 MED ORDER — GLYCOPYRROLATE 0.2 MG/ML IJ SOLN
INTRAMUSCULAR | Status: DC | PRN
  Administered 2021-08-14: .2 mg via INTRAVENOUS

## 2021-08-14 MED ORDER — ONDANSETRON HCL 4 MG/2ML IJ SOLN
INTRAMUSCULAR | Status: DC | PRN
  Administered 2021-08-14: 4 mg via INTRAVENOUS

## 2021-08-14 MED ORDER — LIDOCAINE HCL 1 % IJ SOLN
INTRAMUSCULAR | Status: DC | PRN
  Administered 2021-08-14: 50 mg via INTRADERMAL

## 2021-08-14 MED ORDER — PROPOFOL 10 MG/ML IV BOLUS
INTRAVENOUS | Status: AC
  Filled 2021-08-14: qty 20

## 2021-08-14 MED ORDER — LACTATED RINGERS IV SOLN: INTRAVENOUS | Status: DC | PRN

## 2021-08-14 MED ORDER — PROPOFOL 10 MG/ML IV BOLUS
INTRAVENOUS | Status: DC | PRN
  Administered 2021-08-14: 150 mg via INTRAVENOUS

## 2021-08-14 MED ORDER — DEXAMETHASONE SODIUM PHOSPHATE 10 MG/ML IJ SOLN
INTRAMUSCULAR | Status: DC | PRN
  Administered 2021-08-14: 10 mg via INTRAVENOUS

## 2021-08-14 MED ORDER — FENTANYL CITRATE (PF) 100 MCG/2ML IJ SOLN
INTRAMUSCULAR | Status: AC
  Filled 2021-08-14: qty 2

## 2021-08-14 MED ORDER — SUCCINYLCHOLINE CHLORIDE 200 MG/10ML IV SOSY
PREFILLED_SYRINGE | INTRAVENOUS | Status: DC | PRN
  Administered 2021-08-14: 120 mg via INTRAVENOUS

## 2021-08-14 MED ORDER — EPHEDRINE SULFATE 50 MG/ML IJ SOLN
INTRAMUSCULAR | Status: DC | PRN
  Administered 2021-08-14 (×2): 7 mg via INTRAVENOUS

## 2021-08-14 MED ORDER — FENTANYL CITRATE (PF) 100 MCG/2ML IJ SOLN
INTRAMUSCULAR | Status: DC | PRN
  Administered 2021-08-14 (×2): 50 ug via INTRAVENOUS

## 2021-08-14 SURGICAL SUPPLY — 15 items

## 2021-08-14 NOTE — H&P (Signed)
GASTROENTEROLOGY PROCEDURE H&P NOTE   Primary Care Physician: Marguarite Arbour, MD  HPI: Hector Curry is a 60 y.o. 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  Higher than normal risk procedure since patient remains on Brillanta.  Past Medical History:  Diagnosis Date   Allergic rhinitis    Atrial fibrillation (HCC)    Coronary artery disease, non-occlusive    a. 05/08/2015 Cath: no significant CAD, LVEF nl-->Med; b. 07/2017 MV: attenuation artifact, no ischemia, EF 65%-->Low risk.   Diabetes mellitus without complication (HCC)    Diastolic dysfunction    a. 04/2015 Echo: EF 60-65%, mild MR, mildly dil LA, nl RV fxn, nl PASP; b. 05/2016 Echo: EF 60-65%, no rmwa, Gr1 DD, mildly dil Ao root and asc Ao; c. 07/2017 Echo: EF 55-60%, Ao root 83mm. mildly dil LA; d. 03/2019 Echo: EF 55-60%, nl RV fxn. Mild LAE, Ao root/Asc Ao 52mm.   Dilated aortic root (HCC)    a. 07/2017 Echo: 53mm Ao root - mildly dil; b. 03/2019 Echo: Ao root 38mm.   Diverticulosis 10/03/2014   Dysrhythmia    Family history of premature CAD    a. father passed from MI at 56   GERD (gastroesophageal reflux disease)    Hemorrhoids    a. internal hemorrhoids s/p surgery 1999   History of kidney stones    History of tobacco abuse    Hyperlipidemia    Hyperplastic colon polyp 10/03/2014   a. x 2    Hypertension    Inflammatory arthritis    a. CCP antibodies & x-rays negative. Rheumatoid factor 14, felt to be crystaline over RA or psoriatic   Morbid obesity (HCC)    a. s/p LAP-BAND   OSA (obstructive sleep apnea)    a. on CPAP   Osteoarthritis    PSVT (paroxysmal supraventricular tachycardia) (HCC)    a. 48 hr Holter 04/2015: NSR w/ rare PVC, short runs of narrow complex tachycardiac, possible atrial tach, longest run 7 beats, PACs noted (2% of all beats 3600 total) they did not seem to  correlate w/ significant arrythmia; b. 08/2017 Event monitor: no significant arrhythmias.   Past Surgical History:  Procedure Laterality Date   BARIATRIC SURGERY     lap band    BIOPSY  07/08/2021   Procedure: BIOPSY;  Surgeon: Meridee Score Netty Starring., MD;  Location: [facility];  Service: Gastroenterology;;   CARDIAC CATHETERIZATION N/A 05/08/2015   Procedure: Left Heart Cath and Coronary Angiography;  Surgeon: Corky Crafts, MD;  Location: [facility] INVASIVE CV LAB;  Service: Cardiovascular;  Laterality: N/A;   CARPAL TUNNEL RELEASE Left    CHOLECYSTECTOMY     COLONOSCOPY  06/27/2005   COLONOSCOPY  10/03/2014   CORONARY STENT INTERVENTION N/A 04/15/2021   Procedure: CORONARY STENT INTERVENTION;  Surgeon: Runell Gess, MD;  Location: [facility] INVASIVE CV LAB;  Service: Cardiovascular;  Laterality: N/A;   ESOPHAGEAL DILATION  05/27/2021   Procedure: ESOPHAGEAL DILATION;  Surgeon: Meridee Score Netty Starring., MD;  Location: [facility];  Service: Gastroenterology;;   ESOPHAGEAL STENT PLACEMENT N/A 05/27/2021   Procedure: ESOPHAGEAL STENT PLACEMENT;  Surgeon: Lemar Lofty., MD;  Location: [facility];  Service: Gastroenterology;  Laterality: N/A;   ESOPHAGOGASTRODUODENOSCOPY  06/27/2005   ESOPHAGOGASTRODUODENOSCOPY N/A 03/19/2021   Procedure: ESOPHAGOGASTRODUODENOSCOPY (EGD);  Surgeon: Sheliah Hatch, De Blanch, MD;  Location: [facility];  Service: General;  Laterality: N/A;   ESOPHAGOGASTRODUODENOSCOPY (EGD) WITH  PROPOFOL N/A 05/27/2021   Procedure: ESOPHAGOGASTRODUODENOSCOPY (EGD) WITH PROPOFOL;  Surgeon: Rush Landmark Telford Nab., MD;  Location: [facility];  Service: Gastroenterology;  Laterality: N/A;   ESOPHAGOGASTRODUODENOSCOPY (EGD) WITH PROPOFOL N/A 07/08/2021   Procedure: ESOPHAGOGASTRODUODENOSCOPY (EGD) WITH PROPOFOL;  Surgeon: Rush Landmark Telford Nab., MD;  Location: [facility];  Service: Gastroenterology;  Laterality: N/A;  fluoro   GASTRIC ROUX-EN-Y N/A 02/18/2021   Procedure:  LAPAROSCOPIC ROUX-EN-Y GASTRIC BYPASS WITH UPPER ENDOSCOPY,;  Surgeon: Kieth Brightly Arta Bruce, MD;  Location: [facility];  Service: General;  Laterality: N/A;   HEMORRHOID SURGERY  08/25/1997   IR [place] TUBE CHANGE  03/27/2021   LAPAROSCOPIC INSERTION GASTROSTOMY TUBE N/A 03/20/2021   Procedure: LAPAROSCOPIC INSERTION GASTROSTOMY TUBE;  Surgeon: Mickeal Skinner, MD;  Location: [facility];  Service: General;  Laterality: N/A;   LEFT HEART CATH AND CORONARY ANGIOGRAPHY N/A 04/15/2021   Procedure: LEFT HEART CATH AND CORONARY ANGIOGRAPHY;  Surgeon: Lorretta Harp, MD;  Location: [facility];  Service: Cardiovascular;  Laterality: N/A;   MOUTH SURGERY     removed area which was benign   STENT REMOVAL  07/08/2021   Procedure: STENT REMOVAL;  Surgeon: Irving Copas., MD;  Location: [facility];  Service: Gastroenterology;;   TONSILLECTOMY     UPPER GI ENDOSCOPY N/A 03/20/2021   Procedure: UPPER GI ENDOSCOPY;  Surgeon: Kieth Brightly Arta Bruce, MD;  Location: [facility];  Service: General;  Laterality: N/A;   No current facility-administered medications for this encounter.   No current facility-administered medications for this encounter. Allergies  Allergen Reactions   Other Other (See Comments)    Cats   Family History  Problem Relation Age of Onset   Hypertension Mother    Diabetes Mother    Heart attack Father 65   CAD Father    Hypertension Sister    Diabetes Other    Hypertension Other    Heart disease Other    Colon cancer Neg Hx    Esophageal cancer Neg Hx    Pancreatic cancer Neg Hx    Stomach cancer Neg Hx    Liver disease Neg Hx    Inflammatory bowel disease Neg Hx    Rectal cancer Neg Hx    Social History   Socioeconomic History   Marital status: Married    Spouse name: Not on file   Number of children: Not on file   Years of education: Not on file   Highest education level: Not on file  Occupational History   Not on file  Tobacco Use   Smoking status:  Former    Packs/day: 1.00    Years: 15.00    Pack years: 15.00    Types: Cigarettes    Quit date: 08/05/1989    Years since quitting: 32.0   Smokeless tobacco: Never  Vaping Use   Vaping Use: Never used  Substance and Sexual Activity   Alcohol use: Never   Drug use: Never   Sexual activity: Not on file  Other Topics Concern   Not on file  Social History Narrative   Not on file   Social Determinants of Health   Financial Resource Strain: Not on file  Food Insecurity: Not on file  Transportation Needs: Not on file  Physical Activity: Not on file  Stress: Not on file  Social Connections: Not on file  Intimate Partner Violence: Not on file    Physical Exam: There were no vitals filed for this visit. There is no height or weight on file  to calculate BMI. GEN: NAD EYE: Sclerae anicteric ENT: MMM CV: Non-tachycardic GI: Soft, NT/ND NEURO:  Alert & Oriented x 3  Lab Results: No results for input(s): WBC, HGB, HCT, PLT in the last 72 hours. BMET No results for input(s): NA, K, CL, CO2, GLUCOSE, BUN, CREATININE, CALCIUM in the last 72 hours. LFT No results for input(s): PROT, ALBUMIN, AST, ALT, ALKPHOS, BILITOT, BILIDIR, IBILI in the last 72 hours. PT/INR No results for input(s): LABPROT, INR in the last 72 hours.   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  Higher than normal risk procedure since patient remains on Brillanta.  The risks and benefits of endoscopic evaluation/treatment were discussed with the patient and/or family; these include but are not limited to the risk of perforation, infection, bleeding, missed lesions, lack of diagnosis, severe illness requiring hospitalization, as well as anesthesia and sedation related illnesses.  The patient's history has been reviewed, patient examined, no change in  status, and deemed stable for procedure.  The patient and/or family is agreeable to proceed.    Justice Britain, MD [facility] Office # [phone]

## 2021-08-14 NOTE — Transfer of Care (Signed)
Immediate Anesthesia Transfer of Care Note  Patient: Hector Curry  Procedure(s) Performed: ESOPHAGOGASTRODUODENOSCOPY (EGD) WITH PROPOFOL BIOPSY AXIOS STENT PLACEMENT BALLOON DILATION  Patient Location: PACU and Endoscopy Unit  Anesthesia Type:General  Level of Consciousness: awake, alert , oriented and patient cooperative  Airway & Oxygen Therapy: Patient Spontanous Breathing and Patient connected to face mask oxygen  Post-op Assessment: Report given to RN and Post -op Vital signs reviewed and stable  Post vital signs: Reviewed and stable  Last Vitals:  Vitals Value Taken Time  BP 137/73 08/14/21 1103  Temp    Pulse 60 08/14/21 1104  Resp 18 08/14/21 1104  SpO2 96 % 08/14/21 1104  Vitals shown include unvalidated device data.  Last Pain:  Vitals:   08/14/21 0859  TempSrc: Oral  PainSc: 0-No pain         Complications: No notable events documented.

## 2021-08-14 NOTE — Anesthesia Preprocedure Evaluation (Addendum)
Anesthesia Evaluation  Patient identified by MRN, date of birth, ID band Patient awake    Reviewed: Allergy & Precautions, NPO status , Patient's Chart, lab work & pertinent test results  Airway Mallampati: III  TM Distance: >3 FB Neck ROM: Full    Dental no notable dental hx.    Pulmonary sleep apnea and Continuous Positive Airway Pressure Ventilation , former smoker,    Pulmonary exam normal breath sounds clear to auscultation       Cardiovascular hypertension, Pt. on home beta blockers + CAD, + Past MI and + Cardiac Stents  + dysrhythmias Atrial Fibrillation  Rhythm:Regular Rate:Normal     Neuro/Psych negative neurological ROS  negative psych ROS   GI/Hepatic Neg liver ROS, S/p gastric bypass   Endo/Other  diabetes, Insulin Dependent  Renal/GU negative Renal ROS     Musculoskeletal  (+) Arthritis ,   Abdominal   Peds  Hematology negative hematology ROS (+)   Anesthesia Other Findings dysphagia  Reproductive/Obstetrics                             Anesthesia Physical Anesthesia Plan  ASA: 3  Anesthesia Plan: General   Post-op Pain Management:    Induction: Intravenous  PONV Risk Score and Plan: 2 and Treatment may vary due to age or medical condition, Ondansetron and Dexamethasone  Airway Management Planned: Oral ETT and Video Laryngoscope Planned  Additional Equipment:   Intra-op Plan:   Post-operative Plan: Extubation in OR  Informed Consent: I have reviewed the patients History and Physical, chart, labs and discussed the procedure including the risks, benefits and alternatives for the proposed anesthesia with the patient or authorized representative who has indicated his/her understanding and acceptance.     Dental advisory given  Plan Discussed with: CRNA  Anesthesia Plan Comments:        Anesthesia Quick Evaluation

## 2021-08-14 NOTE — Anesthesia Procedure Notes (Signed)
Procedure Name: Intubation Date/Time: 08/14/2021 10:24 AM Performed by: Garth Bigness, CRNA Pre-anesthesia Checklist: Patient identified, Emergency Drugs available, Suction available and Patient being monitored Patient Re-evaluated:Patient Re-evaluated prior to induction Oxygen Delivery Method: Circle system utilized Preoxygenation: Pre-oxygenation with 100% oxygen Induction Type: IV induction Ventilation: Mask ventilation without difficulty Laryngoscope Size: Glidescope and 3 Grade View: Grade I Tube type: Oral Tube size: 7.5 mm Number of attempts: 2 Airway Equipment and Method: Oral airway and Video-laryngoscopy Placement Confirmation: ETT inserted through vocal cords under direct vision, positive ETCO2 and breath sounds checked- equal and bilateral Secured at: 23 cm Tube secured with: Tape Dental Injury: Teeth and Oropharynx as per pre-operative assessment  Difficulty Due To: Difficulty was anticipated and Difficult Airway- due to anterior larynx

## 2021-08-14 NOTE — Op Note (Signed)
[facility] Patient Name: Hector Curry Procedure Date: 08/14/2021 MRN: [id number] Attending MD: Justice Britain , MD Date of Birth: 19-Mar-1961 CSN: [id number] Age: 60 Admit Type: Outpatient Procedure:                Upper GI endoscopy Indications:              Dysphagia has recurred after 15 mm AXIOS stent was                            removed within a few weeks, Follow-up of                            post-bariatric anastomotic stenosis Providers:                Justice Britain, MD, Doristine Johns, RN, Cherylynn Ridges, Technician, Karis Juba, CRNA Referring MD:             Arta Bruce Kinsinger MD, MD, Leonie Douglas. Doy Hutching, MD Medicines:                General Anesthesia Complications:            No immediate complications. Estimated Blood Loss:     Estimated blood loss was minimal. Procedure:                Pre-Anesthesia Assessment:                           - Prior to the procedure, a History and Physical                            was performed, and patient medications and                            allergies were reviewed. The patient's tolerance of                            previous anesthesia was also reviewed. The risks                            and benefits of the procedure and the sedation                            options and risks were discussed with the patient.                            All questions were answered, and informed consent                            was obtained. Prior Anticoagulants: The patient has                            taken Brilanta, last dose was day of procedure. ASA  Grade Assessment: III - A patient with severe                            systemic disease. After reviewing the risks and                            benefits, the patient was deemed in satisfactory                            condition to undergo the procedure.                           After  obtaining informed consent, the endoscope was                            passed under direct vision. Throughout the                            procedure, the patient's blood pressure, pulse, and                            oxygen saturations were monitored continuously. The                            [id number] ([id number] Olympus therapeutic endoscope                            was introduced through the mouth, and advanced to                            the jejunum. The upper GI endoscopy was                            accomplished without difficulty. The patient                            tolerated the procedure. Scope In: Scope Out: Findings:      No gross lesions were noted in the proximal esophagus and in the mid       esophagus.      White nummular lesions were noted in the distal esophagus. Biopsies were       taken with a cold forceps for histology to rule out Candida.      The Z-line was irregular and was found 46 cm from the incisors.      A benign-appearing, intrinsic severe stenosis was found at the       anastomosis, and though this was improved from prior, it certainly has       decreased in caliber to not allow the therapeutic endoscope to pass       through. Overall, this is felt to be ~1 cm in length, so a good target       again for possible AXIOS stenting. We proceeded and placed a [place]       AXIOS 20 mm x 10 mm via cold delivery. Subsequently a TTS dilator was       passed through the  scope. Dilation with an 04-02-09 mm anastomotic balloon       dilator was performed to allow for the stent to further expand.      The examined jejunum was normal in appearance from distal viewing due to       stenosis. Impression:               - No gross lesions in esophagus proximally. White                            nummular lesions in esophageal mucosa distally -                            biopsied.                           - Z-line irregular, 46 cm from the incisors.                            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 AXIOS 20 mm x 10 mm stent                            placed. Dilated partially to aid in expansion.                           - Normal examined jejunum from distal viewing.                           - Mild oozing noted from intubation due to [place]                            but nothing in esophagus was noted after suctioning. Moderate Sedation:      Not Applicable - Patient had care per Anesthesia. Recommendation:           - The patient will be observed post-procedure,                            until all discharge criteria are met.                           - Discharge patient to home.                           - Patient has a contact number available for                            emergencies. The signs and symptoms of potential  delayed complications were discussed with the                            patient. Return to normal activities tomorrow.                            Written discharge instructions were provided to the                            patient.                           - Monitor for signs/symptoms of bleeding,                            perforation, and infection. If issues please call                            our number to get further assistance as needed.                           - Please use Cepacol or Halls Lozenges +/-                            Chloraseptic spray for next 72-96 hours to aid in                            sore thoat should you experience this.                           - Dilation diet as per protocol. Would allow full                            liquids for next 48 hours to ensure stent is                            expanding fully before advancement of diet as to                            how he had been doing prior to stent removal in                             November.                           - Continue present medications.                           - Await pathology results.                           - Repeat upper endoscopy in 6-8 weeks to check                            healing  and remove the AXIOS stent with hope that                            further remodeling will have occured with the 20 mm                            stent. We are somewhat limited in what else we can                            do with the stricture due to the patient still                            having to be on Brillanta. Stricturoplasty could be                            considered in future when able to come off                            [place].                           - The findings and recommendations were discussed                            with the patient.                           - The findings and recommendations were discussed                            with the patient's family. Procedure Code(s):        --- Professional ---                           [phone], Esophagogastroduodenoscopy, flexible,                            transoral; with placement of endoscopic stent                            (includes pre- and post-dilation and guide wire                            passage, when performed) Diagnosis Code(s):        --- Professional ---                           K22.8, Other specified diseases of esophagus                           K31.89, Other diseases of stomach and duodenum                           R13.10, Dysphagia, unspecified  N47.09, Other complications of other bariatric                            procedure CPT copyright 2019 [employer]. All rights reserved. The codes documented in this report are preliminary and upon coder review may  be revised to meet current compliance requirements. Justice Britain, MD 08/14/2021 10:59:07 AM Number of Addenda: 0

## 2021-08-14 NOTE — Discharge Instructions (Addendum)
Patient did not experience any of the following events: a burn prior to discharge; a fall within the facility; wrong site/side/patient/procedure/implant event; or a hospital transfer or hospital admission upon discharge from the facility. [phone]) YOU HAD AN ENDOSCOPIC PROCEDURE TODAY: Refer to the procedure report and other information in the discharge instructions given to you for any specific questions about what was found during the examination. If this information does not answer your questions, please call [facility] office at [phone] to clarify.   YOU SHOULD EXPECT: Some feelings of bloating in the abdomen. Passage of more gas than usual. Walking can help get rid of the air that was put into your GI tract during the procedure and reduce the bloating. If you had a lower endoscopy (such as a colonoscopy or flexible sigmoidoscopy) you may notice spotting of blood in your stool or on the toilet paper. Some abdominal soreness may be present for a day or two, also.  DIET: Your first meal following the procedure should be a light meal and then it is ok to progress to your normal diet. A half-sandwich or bowl of soup is an example of a good first meal. Heavy or fried foods are harder to digest and may make you feel nauseous or bloated. Drink plenty of fluids but you should avoid alcoholic beverages for 24 hours. If you had a esophageal dilation, please see attached instructions for diet.    ACTIVITY: Your care partner should take you home directly after the procedure. You should plan to take it easy, moving slowly for the rest of the day. You can resume normal activity the day after the procedure however YOU SHOULD NOT DRIVE, use power tools, machinery or perform tasks that involve climbing or major physical exertion for 24 hours (because of the sedation medicines used during the test).   SYMPTOMS TO REPORT IMMEDIATELY: A gastroenterologist can be reached at any hour. Please call [phone]  for any of  the following symptoms:  Following lower endoscopy (colonoscopy, flexible sigmoidoscopy) Excessive amounts of blood in the stool  Significant tenderness, worsening of abdominal pains  Swelling of the abdomen that is new, acute  Fever of 100 or higher  Following upper endoscopy (EGD, EUS, ERCP, esophageal dilation) Vomiting of blood or coffee ground material  New, significant abdominal pain  New, significant chest pain or pain under the shoulder blades  Painful or persistently difficult swallowing  New shortness of breath  Black, tarry-looking or red, bloody stools  FOLLOW UP:  If any biopsies were taken you will be contacted by phone or by letter within the next 1-3 weeks. Call [phone]  if you have not heard about the biopsies in 3 weeks.  Please also call with any specific questions about appointments or follow up tests.

## 2021-08-14 NOTE — Anesthesia Postprocedure Evaluation (Signed)
Anesthesia Post Note  Patient: Hector Curry  Procedure(s) Performed: ESOPHAGOGASTRODUODENOSCOPY (EGD) WITH PROPOFOL BIOPSY AXIOS STENT PLACEMENT BALLOON DILATION     Patient location during evaluation: Endoscopy Anesthesia Type: General Level of consciousness: awake Pain management: pain level controlled Vital Signs Assessment: post-procedure vital signs reviewed and stable Respiratory status: spontaneous breathing, nonlabored ventilation, respiratory function stable and patient connected to nasal cannula oxygen Cardiovascular status: blood pressure returned to baseline and stable Postop Assessment: no apparent nausea or vomiting Anesthetic complications: no   No notable events documented.  Last Vitals:  Vitals:   08/14/21 1120 08/14/21 1132  BP: 119/80 120/69  Pulse: (!) 58 60  Resp: 13 (!) 8  Temp:    SpO2: 94% 95%    Last Pain:  Vitals:   08/14/21 0859  TempSrc: Oral  PainSc: 0-No pain                 Merridith Dershem P Jaimere Feutz

## 2021-08-15 ENCOUNTER — Telehealth: Payer: Self-pay | Admitting: Gastroenterology

## 2021-08-15 ENCOUNTER — Emergency Department (HOSPITAL_COMMUNITY): Payer: No Typology Code available for payment source

## 2021-08-15 ENCOUNTER — Encounter (HOSPITAL_COMMUNITY): Payer: Self-pay

## 2021-08-15 ENCOUNTER — Other Ambulatory Visit: Payer: Self-pay

## 2021-08-15 ENCOUNTER — Inpatient Hospital Stay (HOSPITAL_COMMUNITY)
Admission: EM | Admit: 2021-08-15 | Discharge: 2021-08-17 | DRG: 812 | Disposition: A | Discharge status: Home / Self Care | Payer: No Typology Code available for payment source | Attending: Student | Admitting: Student

## 2021-08-15 ENCOUNTER — Telehealth: Payer: Self-pay

## 2021-08-15 DIAGNOSIS — Z833 Family history of diabetes mellitus: Secondary | ICD-10-CM | POA: Diagnosis not present

## 2021-08-15 DIAGNOSIS — Z20822 Contact with and (suspected) exposure to covid-19: Secondary | ICD-10-CM | POA: Diagnosis not present

## 2021-08-15 DIAGNOSIS — E669 Obesity, unspecified: Secondary | ICD-10-CM | POA: Diagnosis not present

## 2021-08-15 DIAGNOSIS — D62 Acute posthemorrhagic anemia: Secondary | ICD-10-CM | POA: Diagnosis not present

## 2021-08-15 DIAGNOSIS — T884XXA Failed or difficult intubation, initial encounter: Secondary | ICD-10-CM | POA: Diagnosis not present

## 2021-08-15 DIAGNOSIS — K642 Third degree hemorrhoids: Secondary | ICD-10-CM | POA: Diagnosis present

## 2021-08-15 DIAGNOSIS — R55 Syncope and collapse: Secondary | ICD-10-CM | POA: Diagnosis present

## 2021-08-15 DIAGNOSIS — B3781 Candidal esophagitis: Secondary | ICD-10-CM | POA: Diagnosis not present

## 2021-08-15 DIAGNOSIS — K644 Residual hemorrhoidal skin tags: Secondary | ICD-10-CM | POA: Diagnosis not present

## 2021-08-15 DIAGNOSIS — E119 Type 2 diabetes mellitus without complications: Secondary | ICD-10-CM | POA: Diagnosis not present

## 2021-08-15 DIAGNOSIS — Z8249 Family history of ischemic heart disease and other diseases of the circulatory system: Secondary | ICD-10-CM | POA: Diagnosis not present

## 2021-08-15 DIAGNOSIS — I252 Old myocardial infarction: Secondary | ICD-10-CM | POA: Diagnosis not present

## 2021-08-15 DIAGNOSIS — E785 Hyperlipidemia, unspecified: Secondary | ICD-10-CM | POA: Diagnosis not present

## 2021-08-15 DIAGNOSIS — K219 Gastro-esophageal reflux disease without esophagitis: Secondary | ICD-10-CM | POA: Diagnosis present

## 2021-08-15 DIAGNOSIS — I4891 Unspecified atrial fibrillation: Secondary | ICD-10-CM | POA: Diagnosis present

## 2021-08-15 DIAGNOSIS — K920 Hematemesis: Secondary | ICD-10-CM

## 2021-08-15 DIAGNOSIS — M1990 Unspecified osteoarthritis, unspecified site: Secondary | ICD-10-CM | POA: Diagnosis present

## 2021-08-15 DIAGNOSIS — E1165 Type 2 diabetes mellitus with hyperglycemia: Secondary | ICD-10-CM

## 2021-08-15 DIAGNOSIS — R1310 Dysphagia, unspecified: Secondary | ICD-10-CM | POA: Diagnosis present

## 2021-08-15 DIAGNOSIS — Z9884 Bariatric surgery status: Secondary | ICD-10-CM

## 2021-08-15 DIAGNOSIS — K222 Esophageal obstruction: Secondary | ICD-10-CM | POA: Diagnosis present

## 2021-08-15 DIAGNOSIS — I77810 Thoracic aortic ectasia: Secondary | ICD-10-CM | POA: Diagnosis not present

## 2021-08-15 DIAGNOSIS — Z955 Presence of coronary angioplasty implant and graft: Secondary | ICD-10-CM

## 2021-08-15 DIAGNOSIS — Z79899 Other long term (current) drug therapy: Secondary | ICD-10-CM

## 2021-08-15 DIAGNOSIS — I5032 Chronic diastolic (congestive) heart failure: Secondary | ICD-10-CM | POA: Diagnosis present

## 2021-08-15 DIAGNOSIS — I2510 Atherosclerotic heart disease of native coronary artery without angina pectoris: Secondary | ICD-10-CM | POA: Diagnosis not present

## 2021-08-15 DIAGNOSIS — Z87891 Personal history of nicotine dependence: Secondary | ICD-10-CM | POA: Diagnosis not present

## 2021-08-15 DIAGNOSIS — K922 Gastrointestinal hemorrhage, unspecified: Secondary | ICD-10-CM | POA: Diagnosis present

## 2021-08-15 DIAGNOSIS — Z794 Long term (current) use of insulin: Secondary | ICD-10-CM

## 2021-08-15 DIAGNOSIS — Z6829 Body mass index (BMI) 29.0-29.9, adult: Secondary | ICD-10-CM

## 2021-08-15 DIAGNOSIS — I110 Hypertensive heart disease with heart failure: Secondary | ICD-10-CM | POA: Diagnosis not present

## 2021-08-15 DIAGNOSIS — K9189 Other postprocedural complications and disorders of digestive system: Secondary | ICD-10-CM | POA: Diagnosis present

## 2021-08-15 DIAGNOSIS — G4733 Obstructive sleep apnea (adult) (pediatric): Secondary | ICD-10-CM | POA: Diagnosis present

## 2021-08-15 DIAGNOSIS — Z7902 Long term (current) use of antithrombotics/antiplatelets: Secondary | ICD-10-CM | POA: Diagnosis not present

## 2021-08-15 DIAGNOSIS — J392 Other diseases of pharynx: Secondary | ICD-10-CM

## 2021-08-15 DIAGNOSIS — R1312 Dysphagia, oropharyngeal phase: Secondary | ICD-10-CM | POA: Diagnosis not present

## 2021-08-15 DIAGNOSIS — Z7982 Long term (current) use of aspirin: Secondary | ICD-10-CM

## 2021-08-15 HISTORY — DX: Supraventricular tachycardia: I47.1

## 2021-08-15 HISTORY — DX: Esophageal obstruction: K22.2

## 2021-08-15 HISTORY — DX: Atherosclerotic heart disease of native coronary artery without angina pectoris: I25.10

## 2021-08-15 HISTORY — DX: Other supraventricular tachycardia: I47.19

## 2021-08-15 HISTORY — DX: Gastrointestinal hemorrhage, unspecified: K92.2

## 2021-08-15 LAB — PROTIME-INR
INR: 1.2 (ref 0.8–1.2)
Prothrombin Time: 15.3 seconds — ABNORMAL HIGH (ref 11.4–15.2)

## 2021-08-15 LAB — CBC
HCT: 30.7 % — ABNORMAL LOW (ref 39.0–52.0)
HCT: 27.6 % — ABNORMAL LOW (ref 39.0–52.0)
Hemoglobin: 9.7 g/dL — ABNORMAL LOW (ref 13.0–17.0)
Hemoglobin: 8.8 g/dL — ABNORMAL LOW (ref 13.0–17.0)
MCH: 28 pg (ref 26.0–34.0)
MCH: 27.9 pg (ref 26.0–34.0)
MCHC: 31.6 g/dL (ref 30.0–36.0)
MCHC: 31.9 g/dL (ref 30.0–36.0)
MCV: 88.5 fL (ref 80.0–100.0)
MCV: 87.6 fL (ref 80.0–100.0)
Platelets: 249 10*3/uL (ref 150–400)
Platelets: 218 10*3/uL (ref 150–400)
RBC: 3.47 MIL/uL — ABNORMAL LOW (ref 4.22–5.81)
RBC: 3.15 MIL/uL — ABNORMAL LOW (ref 4.22–5.81)
RDW: 14.4 % (ref 11.5–15.5)
RDW: 14.6 % (ref 11.5–15.5)
WBC: 16.3 10*3/uL — ABNORMAL HIGH (ref 4.0–10.5)
WBC: 17.4 10*3/uL — ABNORMAL HIGH (ref 4.0–10.5)
nRBC: 0 % (ref 0.0–0.2)
nRBC: 0 % (ref 0.0–0.2)

## 2021-08-15 LAB — COMPREHENSIVE METABOLIC PANEL
ALT: 26 U/L (ref 0–44)
AST: 23 U/L (ref 15–41)
Albumin: 3 g/dL — ABNORMAL LOW (ref 3.5–5.0)
Alkaline Phosphatase: 73 U/L (ref 38–126)
Anion gap: 11 (ref 5–15)
BUN: 25 mg/dL — ABNORMAL HIGH (ref 6–20)
CO2: 25 mmol/L (ref 22–32)
Calcium: 8.4 mg/dL — ABNORMAL LOW (ref 8.9–10.3)
Chloride: 101 mmol/L (ref 98–111)
Creatinine, Ser: 0.74 mg/dL (ref 0.61–1.24)
GFR, Estimated: >60 mL/min (ref >60)
Glucose, Bld: 254 mg/dL — ABNORMAL HIGH (ref 70–99)
Potassium: 4.7 mmol/L (ref 3.5–5.1)
Sodium: 137 mmol/L (ref 135–145)
Total Bilirubin: 0.6 mg/dL (ref 0.3–1.2)
Total Protein: 5.7 g/dL — ABNORMAL LOW (ref 6.5–8.1)

## 2021-08-15 LAB — RESP PANEL BY RT-PCR (FLU A&B, COVID) ARPGX2
Influenza A by PCR: NEGATIVE
Influenza B by PCR: NEGATIVE
SARS Coronavirus 2 by RT PCR: NEGATIVE

## 2021-08-15 LAB — PREPARE RBC (CROSSMATCH)

## 2021-08-15 LAB — POC OCCULT BLOOD, ED: Fecal Occult Bld: POSITIVE — AB

## 2021-08-15 LAB — SURGICAL PATHOLOGY

## 2021-08-15 LAB — GLUCOSE, CAPILLARY: Glucose-Capillary: 197 mg/dL — ABNORMAL HIGH (ref 70–99)

## 2021-08-15 MED ORDER — SODIUM CHLORIDE 0.9% IV SOLUTION: Freq: Once | INTRAVENOUS | Status: DC

## 2021-08-15 MED ORDER — SODIUM CHLORIDE 0.9 % IV SOLN: INTRAVENOUS | Status: DC

## 2021-08-15 MED ORDER — PANTOPRAZOLE SODIUM 40 MG IV SOLR
80.0000 mg | Freq: Once | INTRAVENOUS | Status: AC
  Administered 2021-08-15: 10:00:00 80 mg via INTRAVENOUS
  Filled 2021-08-15: qty 80

## 2021-08-15 MED ORDER — PANTOPRAZOLE SODIUM 40 MG IV SOLR
40.0000 mg | Freq: Two times a day (BID) | INTRAVENOUS | Status: DC
  Administered 2021-08-15 – 2021-08-16 (×3): 40 mg via INTRAVENOUS
  Filled 2021-08-15 (×3): qty 40

## 2021-08-15 MED ORDER — METOPROLOL TARTRATE 5 MG/5ML IV SOLN
5.0000 mg | Freq: Four times a day (QID) | INTRAVENOUS | Status: DC | PRN
Start: 2021-08-15 — End: 2021-08-17

## 2021-08-15 MED ORDER — ONDANSETRON HCL 4 MG PO TABS: 4.0000 mg | ORAL_TABLET | Freq: Four times a day (QID) | ORAL | Status: DC | PRN

## 2021-08-15 MED ORDER — ONDANSETRON HCL 4 MG/2ML IJ SOLN: 4.0000 mg | Freq: Four times a day (QID) | INTRAMUSCULAR | Status: DC | PRN

## 2021-08-15 MED ORDER — INSULIN ASPART 100 UNIT/ML IJ SOLN
0.0000 [IU] | INTRAMUSCULAR | Status: DC
  Administered 2021-08-15 – 2021-08-16 (×2): 3 [IU] via SUBCUTANEOUS
  Administered 2021-08-16: 08:00:00 2 [IU] via SUBCUTANEOUS
  Administered 2021-08-16: 05:00:00 3 [IU] via SUBCUTANEOUS
  Administered 2021-08-17 (×3): 2 [IU] via SUBCUTANEOUS

## 2021-08-15 MED ORDER — SODIUM CHLORIDE 0.9 % IV BOLUS
500.0000 mL | Freq: Once | INTRAVENOUS | Status: AC
  Administered 2021-08-15: 09:00:00 500 mL via INTRAVENOUS

## 2021-08-15 NOTE — Telephone Encounter (Signed)
Patient's wife called this morning.  Patient was having a lot of bleeding from his rectum this morning and passed out.  EMS is taking him to [facility].  Please call his wife for more information.  Her number is [phone].

## 2021-08-15 NOTE — ED Triage Notes (Addendum)
Patient via EMS due to GI Bleeding. Pt had esophageal stent placed the previous day.  EMS estimated blood loss of .

## 2021-08-15 NOTE — Telephone Encounter (Signed)
Thank you for sending this. Extensive discussion with the ED and medical service. GM

## 2021-08-15 NOTE — Progress Notes (Signed)
I received a late night message from the patient which I replied very early this morning 2.  My nurse reached out to the patient this morning however the patient's wife answered and said that they had to take him via EMS to [facility] due to significant hematemesis and rectal bleeding and syncope. I called and spoke with the patient's wife this morning and with the patient briefly as they are in the ED currently. I discussed the case with Dr. Jacqulyn Bath who was examining the patient as well. Certainly patient at risk of post procedural complications but with the way the AXIOS stenting went yesterday as well as how he has responded previously, I have a low pretest chance that this is from the endoscopy itself or the stent.  However nothing is impossible. I have higher concerned that there is something within the oropharynx or larynx that could be a source of bleeding as the patient did have a difficult intubation with oozing that was noted in the oropharynx towards the end of the procedure. I think it is reasonable to have the patient on Protonix twice daily while this is being evaluated. I think an ENT evaluation or oropharyngeal evaluation is probably the most ideal. If the GI team is asked to evaluate the patient for source of bleeding and an endoscopy is recommended, I am happy to discuss with them my findings and my thoughts. I will have my team reach out to the patient again later today to follow-up on where things stand if he is discharged from the hospital.   Corliss Parish, MD [facility] Advanced Endoscopy Office # [phone]

## 2021-08-15 NOTE — ED Notes (Signed)
Pt is refusing all CBG

## 2021-08-15 NOTE — ED Notes (Signed)
Received report from Otis Brace RN. Pt resting. Color pale. Denies pain. Up to w/c and using bathroom at this time. Aware GI has been consulted. A/o

## 2021-08-15 NOTE — ED Notes (Signed)
Pt now states he wants to wait to have a blood transfusion. Would like to wait until after his next lab results to see if his HGB has dropped below 8.

## 2021-08-15 NOTE — Telephone Encounter (Signed)
Thank you for sending this.  Extensive discussion with the ED and medical service.

## 2021-08-15 NOTE — Telephone Encounter (Signed)
Spoke with wife this morning who was very emotional over the phone. She stated that pt started to cough up a large blood clots last night. She stated that they were told it could have been from a small tear during the procedure yesterday. This morning he woke up, and was very pale and had a syncope event when trying to get out of bed. Wife stated he started to seize for a short time after falling. Afterwards, pt was able to walk to the bathroom, but had an onset of rectal bleeding. EMS was called, and he is now in the ED at [facility].

## 2021-08-15 NOTE — Telephone Encounter (Signed)
See other notes, patient was transferred by EMS to [facility] this am.  Dr. Meridee Score was notified earlier today

## 2021-08-15 NOTE — ED Provider Notes (Signed)
Emergency Department Provider Note   I have reviewed the triage vital signs and the nursing notes.   HISTORY  Chief Complaint GI Bleeding   HPI Hector Curry is a 60 y.o. male with past medical history reviewed below including esophageal stenting with Dr. Meridee ScoreMansouraty with  GI yesterday presents to the emergency department with GI bleeding and syncope this morning.  Patient states that after extubation yesterday he was having some clot which he was pulling out of his nose and sometimes out of his mouth.  He had 1 episode yesterday of fairly large volume bright red bloody emesis.  Symptoms continued through the night.  This morning, he woke up and does not recall passing out but his wife states that he was not responding to her while sitting on the edge of the bed and then slid down to the floor.  She ran to call 911 and when she came back he had made his way to the toilet.  She saw blood on the floor as well as the toilet which was bright red and appeared to be coming from the rectum.  EMS arrived and started an IV.  He was transported to the emergency department.  Patient is not having pain in his chest or abdomen.  He states overall he is feeling better than he did this morning.  He does take Brilinta with history of heart issues.   In discussion with the patient's GI physician, who happened to be calling the family while I was in the room, the intubation yesterday was difficult and there overall suspicion for complication due to stenting is low.  There is some concern for possible trauma related to intubation which may be causing some persistent bleeding or oozing.    Past Medical History:  Diagnosis Date   Allergic rhinitis    Atrial fibrillation (HCC)    Coronary artery disease, non-occlusive    a. 05/08/2015 Cath: no significant CAD, LVEF nl-->Med; b. 07/2017 MV: attenuation artifact, no ischemia, EF 65%-->Low risk.   Diabetes mellitus without complication (HCC)     Diastolic dysfunction    a. 04/2015 Echo: EF 60-65%, mild MR, mildly dil LA, nl RV fxn, nl PASP; b. 05/2016 Echo: EF 60-65%, no rmwa, Gr1 DD, mildly dil Ao root and asc Ao; c. 07/2017 Echo: EF 55-60%, Ao root 42mm. mildly dil LA; d. 03/2019 Echo: EF 55-60%, nl RV fxn. Mild LAE, Ao root/Asc Ao 42mm.   Dilated aortic root (HCC)    a. 07/2017 Echo: 42mm Ao root - mildly dil; b. 03/2019 Echo: Ao root 42mm.   Diverticulosis 10/03/2014   Dysrhythmia    Family history of premature CAD    a. father passed from MI at 5449   GERD (gastroesophageal reflux disease)    Hemorrhoids    a. internal hemorrhoids s/p surgery 1999   History of kidney stones    History of tobacco abuse    Hyperlipidemia    Hyperplastic colon polyp 10/03/2014   a. x 2    Hypertension    Inflammatory arthritis    a. CCP antibodies & x-rays negative. Rheumatoid factor 14, felt to be crystaline over RA or psoriatic   Morbid obesity (HCC)    a. s/p LAP-BAND   OSA (obstructive sleep apnea)    a. on CPAP   Osteoarthritis    PSVT (paroxysmal supraventricular tachycardia) (HCC)    a. 48 hr Holter 04/2015: NSR w/ rare PVC, short runs of narrow complex tachycardiac, possible atrial tach, longest  run 7 beats, PACs noted (2% of all beats 3600 total) they did not seem to correlate w/ significant arrythmia; b. 08/2017 Event monitor: no significant arrhythmias.    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    Morbid obesity ([place]) 02/18/2021   History of kidney stones 05/30/2016   Elevated LFTs 02/13/2016   Morbid obesity with BMI of 40.0-44.9, adult ([place]) 12/20/2015   Atrial tachycardia ([place]) 06/21/2015   Hypertriglyceridemia 05/15/2015   Coronary artery disease, non-occlusive    Type II diabetes mellitus, uncontrolled 05/08/2015   Chest pain at rest    Hyperglycemia 05/07/2015   Chest pain 05/06/2015   GERD (gastroesophageal reflux disease)    Kidney stones    Hemorrhoids    Inflammatory arthritis    Family history of premature CAD    OSA (obstructive sleep apnea)    History of tobacco abuse    Palpitations    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/20/2014   Diverticulosis 10/03/2014   Hyperplastic colon polyp 10/03/2014   HTN, goal below 140/90 08/07/2014   Pain in the chest 06/19/2014   SOB (shortness of breath) 06/19/2014   Obesity 06/19/2014   Hyperlipidemia 06/19/2014    Past Surgical History:  Procedure Laterality Date   BALLOON DILATION N/A 08/14/2021   Procedure: BALLOON DILATION;  Surgeon: Irving Copas., MD;  Location: [facility];  Service: Gastroenterology;  Laterality: N/A;   BARIATRIC SURGERY     lap band    BILIARY STENT PLACEMENT N/A 08/14/2021   Procedure: AXIOS STENT PLACEMENT;  Surgeon: Irving Copas., MD;  Location: [facility];  Service: Gastroenterology;  Laterality: N/A;   BIOPSY  07/08/2021   Procedure: BIOPSY;  Surgeon: Rush Landmark Telford Nab., MD;  Location: [facility];  Service: Gastroenterology;;   BIOPSY  08/14/2021   Procedure: BIOPSY;  Surgeon: Irving Copas., MD;  Location: [facility];  Service: Gastroenterology;;   CARDIAC CATHETERIZATION N/A 05/08/2015   Procedure: Left Heart Cath and Coronary Angiography;  Surgeon: Jettie Booze, MD;  Location: [facility];  Service: Cardiovascular;  Laterality: N/A;   CARPAL TUNNEL RELEASE Left    CHOLECYSTECTOMY     COLONOSCOPY  06/27/2005   COLONOSCOPY   10/03/2014   CORONARY STENT INTERVENTION N/A 04/15/2021   Procedure: CORONARY STENT INTERVENTION;  Surgeon: Lorretta Harp, MD;  Location: [facility];  Service: Cardiovascular;  Laterality: N/A;   ESOPHAGEAL DILATION  05/27/2021   Procedure: ESOPHAGEAL DILATION;  Surgeon: Rush Landmark Telford Nab., MD;  Location: [facility];  Service: Gastroenterology;;   ESOPHAGEAL STENT PLACEMENT N/A 05/27/2021   Procedure: ESOPHAGEAL STENT PLACEMENT;  Surgeon: Irving Copas., MD;  Location: [facility];  Service: Gastroenterology;  Laterality: N/A;   ESOPHAGOGASTRODUODENOSCOPY  06/27/2005   ESOPHAGOGASTRODUODENOSCOPY N/A 03/19/2021   Procedure: ESOPHAGOGASTRODUODENOSCOPY (EGD);  Surgeon: Kieth Brightly, Arta Bruce, MD;  Location: [facility];  Service: General;  Laterality: N/A;   ESOPHAGOGASTRODUODENOSCOPY (EGD) WITH PROPOFOL N/A 05/27/2021   Procedure: ESOPHAGOGASTRODUODENOSCOPY (EGD) WITH PROPOFOL;  Surgeon: Irving Copas., MD;  Location:  [facility];  Service: Gastroenterology;  Laterality: N/A;   ESOPHAGOGASTRODUODENOSCOPY (EGD) WITH PROPOFOL N/A 07/08/2021   Procedure: ESOPHAGOGASTRODUODENOSCOPY (EGD) WITH PROPOFOL;  Surgeon: Rush Landmark Telford Nab., MD;  Location: [facility];  Service: Gastroenterology;  Laterality: N/A;  fluoro   ESOPHAGOGASTRODUODENOSCOPY (EGD) WITH PROPOFOL N/A 08/14/2021   Procedure: ESOPHAGOGASTRODUODENOSCOPY (EGD) WITH PROPOFOL;  Surgeon: Rush Landmark Telford Nab., MD;  Location: [facility];  Service: Gastroenterology;  Laterality: N/A;  fluoro Axios stent (20 mm)   GASTRIC ROUX-EN-Y N/A 02/18/2021   Procedure: LAPAROSCOPIC ROUX-EN-Y GASTRIC BYPASS WITH UPPER ENDOSCOPY,;  Surgeon: Kieth Brightly Arta Bruce, MD;  Location: [facility];  Service: General;  Laterality: N/A;   HEMORRHOID SURGERY  08/25/1997   IR [place] TUBE CHANGE  03/27/2021   LAPAROSCOPIC INSERTION GASTROSTOMY TUBE N/A 03/20/2021   Procedure: LAPAROSCOPIC INSERTION GASTROSTOMY TUBE;  Surgeon: Mickeal Skinner, MD;  Location: [facility];  Service: General;  Laterality: N/A;   LEFT HEART CATH AND CORONARY ANGIOGRAPHY N/A 04/15/2021   Procedure: LEFT HEART CATH AND CORONARY ANGIOGRAPHY;  Surgeon: Lorretta Harp, MD;  Location: [facility];  Service: Cardiovascular;  Laterality: N/A;   MOUTH SURGERY     removed area which was benign   STENT REMOVAL  07/08/2021   Procedure: STENT REMOVAL;  Surgeon: Irving Copas., MD;  Location: [facility];  Service: Gastroenterology;;   TONSILLECTOMY     UPPER GI ENDOSCOPY N/A 03/20/2021   Procedure: UPPER GI ENDOSCOPY;  Surgeon: Mickeal Skinner, MD;  Location: [facility];  Service: General;  Laterality: N/A;    Allergies Other  Family History  Problem Relation Age of Onset   Hypertension Mother    Diabetes Mother    Heart attack Father 58   CAD Father    Hypertension Sister    Diabetes Other    Hypertension Other    Heart disease Other    Colon cancer Neg Hx    Esophageal cancer Neg Hx    Pancreatic cancer Neg Hx    Stomach cancer Neg Hx    Liver disease Neg Hx    Inflammatory bowel disease Neg Hx    Rectal cancer Neg Hx     Social History Social History   Tobacco Use   Smoking status: Former    Packs/day: 1.00    Years: 15.00    Pack years: 15.00    Types: Cigarettes    Quit date: 08/05/1989    Years since quitting: 32.0   Smokeless tobacco: Never  Vaping Use   Vaping Use: Never used  Substance Use Topics   Alcohol use: Never   Drug use: Never    Review of Systems  Constitutional: No fever/chills Eyes: No visual changes. ENT: No sore throat. Cardiovascular: Denies chest pain. Positive syncope.  Respiratory: Denies shortness of breath. Gastrointestinal: No abdominal pain. Positive vomiting.  No diarrhea.  No constipation. Positive vomiting blood and passing blood per rectum.  Genitourinary: Negative for dysuria. Musculoskeletal: Negative for back pain. Skin: Negative for rash. Neurological: Negative for  headaches, focal weakness or numbness.  10-point ROS otherwise negative.  ____________________________________________   PHYSICAL EXAM:  VITAL SIGNS: ED Triage Vitals  Enc Vitals Group     BP 08/15/21 0856 (!) 155/137     Pulse --      Resp 08/15/21 0856 16     Temp 08/15/21 0856 98.6 F (37 C)     Temp Source 08/15/21 0856 Oral     SpO2 08/15/21 0856 99 %     Weight  08/15/21 0857 232 lb (105.2 kg)     Height 08/15/21 0857 6\' 2"  (1.88 m)   Constitutional: Alert and oriented. Well appearing and in no acute distress. Eyes: Conjunctivae are normal. Head: Atraumatic. Nose: No congestion/rhinnorhea. Mouth/Throat: Mucous membranes are moist.  Oropharynx non-erythematous.  The uvula does have some bruising and what appears to be a small laceration to the lateral aspect of the uvula.  This appears hemostatic.  Neck: No stridor.   Cardiovascular: Normal rate, regular rhythm. Good peripheral circulation. Grossly normal heart sounds.   Respiratory: Normal respiratory effort.  No retractions. Lungs CTAB. Gastrointestinal: Soft and nontender. No distention.  Musculoskeletal: No lower extremity tenderness nor edema. No gross deformities of extremities. Neurologic:  Normal speech and language. No gross focal neurologic deficits are appreciated.  Skin:  Skin is warm, dry and intact. No rash noted.   ____________________________________________   LABS (all labs ordered are listed, but only abnormal results are displayed)  Labs Reviewed  COMPREHENSIVE METABOLIC PANEL - Abnormal; Notable for the following components:      Result Value   Glucose, Bld 254 (*)    BUN 25 (*)    Calcium 8.4 (*)    Total Protein 5.7 (*)    Albumin 3.0 (*)    All other components within normal limits  CBC - Abnormal; Notable for the following components:   WBC 16.3 (*)    RBC 3.47 (*)    Hemoglobin 9.7 (*)    HCT 30.7 (*)    All other components within normal limits  PROTIME-INR - Abnormal; Notable for  the following components:   Prothrombin Time 15.3 (*)    All other components within normal limits  POC OCCULT BLOOD, ED - Abnormal; Notable for the following components:   Fecal Occult Bld POSITIVE (*)    All other components within normal limits  RESP PANEL BY RT-PCR (FLU A&B, COVID) ARPGX2  TYPE AND SCREEN   ____________________________________________  EKG   EKG Interpretation  Date/Time:  Thursday August 15 2021 14:45:38 EST Ventricular Rate:  96 PR Interval:  153 QRS Duration: 143 QT Interval:  386 QTC Calculation: 488 R Axis:   88 Text Interpretation: Sinus rhythm Right bundle branch block Confirmed by [id number] [phone]) on 08/15/2021 3:17:12 PM        ____________________________________________  RADIOLOGY  DG Abdomen Acute W/Chest  Result Date: 08/15/2021 CLINICAL DATA:  Post GI procedure.  GI bleed. EXAM: DG ABDOMEN ACUTE WITH 1 VIEW CHEST COMPARISON:  05/27/2021 FINDINGS: Gastrostomy tube projects over the stomach. No free air, organomegaly or suspicious calcification. Prior cholecystectomy. Gas within mildly prominent large and small bowel loops, likely within normal limits or slightly dilated suggesting ileus. No acute bony abnormality. Visualized lungs clear. IMPRESSION: Gas within slightly prominent small bowel and large bowel loops. This could be within normal limits or reflect early ileus. No evidence of bowel obstruction. Gastrostomy projects over the stomach. No free air. Electronically Signed   By: 07/27/2021 M.D.   On: 08/15/2021 10:33    ____________________________________________   PROCEDURES  Procedure(s) performed:   Procedures  CRITICAL CARE Performed by: 08/17/2021 Total critical care time: 45 minutes Critical care time was exclusive of separately billable procedures and treating other patients. Critical care was necessary to treat or prevent imminent or life-threatening deterioration. Critical care was time spent personally by  me on the following activities: development of treatment plan with patient and/or surrogate as well as nursing, discussions with consultants, evaluation of patient's  response to treatment, examination of patient, obtaining history from patient or surrogate, ordering and performing treatments and interventions, ordering and review of laboratory studies, ordering and review of radiographic studies, pulse oximetry and re-evaluation of patient's condition.  Nanda Quinton, MD Emergency Medicine  ____________________________________________   INITIAL IMPRESSION / ASSESSMENT AND PLAN / ED COURSE  Pertinent labs & imaging results that were available during my care of the patient were reviewed by me and considered in my medical decision making (see chart for details).   Patient presents the emergency department with GI bleeding after esophageal stenting yesterday.  The patient describes some clot coming from the mouth and nose after intubation.  I do see some bruising and perhaps a small laceration to the lateral aspect of the uvula but this appears to be hemostatic at this time.  Question if this could be causing some of the patient's persistent bleed/oozing type symptoms.  He is not having pain in his chest or abdomen.  His exam of the abdomen is completely nontender.  His vital signs here are reassuring.  He does seem somewhat pale.  Will need labs, volume resuscitation, and further discussion with GI.  Will obtain 3 view abdomen and chest series to assess for mediastinal air or free air in the abdomen but very low suspicion for this clinically.   Spoke with Dr. Redmond Baseman with ENT.  He can evaluate when the patient arrives to [facility].  Would like the hospitalist staff to make him aware.    Spoke with Dr. Gala Romney.  Does not advise urgent upper endoscopy here.   Patient is doing well here.  He is not passing blood from above or below.  Hemodynamics remaining stable. Protonix ordered.   Plan for admit to [facility].  ENT and GI can follow there.   Discussed patient's case with [facility] to request admission. Patient and family (if present) updated with plan. Care transferred to [facility] service.  I reviewed all nursing notes, vitals, pertinent old records, EKGs, labs, imaging (as available).  ____________________________________________  FINAL CLINICAL IMPRESSION(S) / ED DIAGNOSES  Final diagnoses:  Syncope, unspecified syncope type  Gastrointestinal hemorrhage, unspecified gastrointestinal hemorrhage type  Oropharyngeal bleeding    MEDICATIONS GIVEN DURING THIS VISIT:  Medications  pantoprazole (PROTONIX) injection 40 mg (has no administration in time range)  sodium chloride 0.9 % bolus 500 mL (0 mLs Intravenous Stopped 08/15/21 0954)  pantoprazole (PROTONIX) injection 80 mg (80 mg Intravenous Given 08/15/21 0930)    Note:  This document was prepared using Dragon voice recognition software and may include unintentional dictation errors.  Nanda Quinton, MD, [facility] Emergency Medicine    Cova Knieriem, [facility], MD 08/15/21 [phone]

## 2021-08-15 NOTE — H&P (Signed)
History and Physical    Hector Curry [id number] DOB: [birth year] DOA: 08/15/2021  PCP: [name], MD  Patient coming from: home  I have personally briefly reviewed patient's old medical records in [place]  Chief Complaint: melena, hematemesis  HPI: Hector Curry is a 60 y.o. 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  He has been followed by gastroenterology and has had stent placement across stricture.  PEG tube also placed in the past since he was unable to eat or drink due to stricture.  He also has a history of coronary artery disease and had stents placed in 03/2021.  He has obstructive sleep apnea on BiPAP.  Patient had done fairly well after initial stent placement, but subsequently developed restenosis.  He underwent EGD on 12/21 where new stent was placed.  During procedure, it was noted that he was a difficult intubation and he had possible oozing from the larynx.  Procedure was done in the setting of Brilinta.  At the time the procedure was ended, after suctioning, no blood was noted in the GI tract.  Patient reports that he returned home and was drinking clear liquids.  Initially reported that he was spitting up some clots of blood.  Subsequently developed significant hematemesis after returning home.  This morning, reports that he noted dark/red-colored stools.  He was very dizzy and lightheaded on sitting up and standing.  Wife reports that he may have passed out.  Denies any chest pain or shortness of breath.  On arrival to the emergency room, he was noted to be mildly hypotensive.  Hemoglobin down in the 9 range from previous values of 14.  He has not had any further vomiting or melena since arrival to the emergency room.  Case was discussed with his primary gastroenterologist as well as GI at [facility], Dr. Gala Romney.  Since patient has had recent GI stent placement, Dr. Gala Romney has  recommended transfer to [facility] for further evaluation.   Review of Systems: As per HPI otherwise 10 point review of systems negative.    Past Medical History:  Diagnosis Date   Allergic rhinitis    Atrial fibrillation (HCC)    Coronary artery disease, non-occlusive    a. 05/08/2015 Cath: no significant CAD, LVEF nl-->Med; b. 07/2017 MV: attenuation artifact, no ischemia, EF 65%-->Low risk.   Diabetes mellitus without complication ([place])    Diastolic dysfunction    a. 04/2015 Echo: EF 60-65%, mild MR, mildly dil LA, nl RV fxn, nl PASP; b. 05/2016 Echo: EF 60-65%, no rmwa, Gr1 DD, mildly dil Ao root and asc Ao; c. 07/2017 Echo: EF 55-60%, Ao root 24mm. mildly dil LA; d. 03/2019 Echo: EF 55-60%, nl RV fxn. Mild LAE, Ao root/Asc Ao 85mm.   Dilated aortic root ([place])    a. 07/2017 Echo: 60mm Ao root - mildly dil; b. 03/2019 Echo: Ao root 71mm.   Diverticulosis 10/03/2014   Dysrhythmia    Family history of premature CAD    a. father passed from MI at 58   GERD (gastroesophageal reflux disease)    Hemorrhoids    a. internal hemorrhoids s/p surgery 1999   History of kidney stones    History of tobacco abuse    Hyperlipidemia    Hyperplastic colon polyp 10/03/2014   a. x 2    Hypertension    Inflammatory arthritis    a. CCP antibodies & x-rays negative. Rheumatoid factor 14,  felt to be crystaline over RA or psoriatic   Morbid obesity (HCC)    a. s/p LAP-BAND   OSA (obstructive sleep apnea)    a. on CPAP   Osteoarthritis    PSVT (paroxysmal supraventricular tachycardia) (HCC)    a. 48 hr Holter 04/2015: NSR w/ rare PVC, short runs of narrow complex tachycardiac, possible atrial tach, longest run 7 beats, PACs noted (2% of all beats 3600 total) they did not seem to correlate w/ significant arrythmia; b. 08/2017 Event monitor: no significant arrhythmias.    Past Surgical History:  Procedure Laterality Date   BALLOON DILATION N/A 08/14/2021   Procedure: BALLOON DILATION;  Surgeon:  Mansouraty, Netty Starring., MD;  Location: [facility];  Service: Gastroenterology;  Laterality: N/A;   BARIATRIC SURGERY     lap band    BILIARY STENT PLACEMENT N/A 08/14/2021   Procedure: AXIOS STENT PLACEMENT;  Surgeon: Lemar Lofty., MD;  Location: [facility];  Service: Gastroenterology;  Laterality: N/A;   BIOPSY  07/08/2021   Procedure: BIOPSY;  Surgeon: Meridee Score Netty Starring., MD;  Location: [facility];  Service: Gastroenterology;;   BIOPSY  08/14/2021   Procedure: BIOPSY;  Surgeon: Lemar Lofty., MD;  Location: [facility];  Service: Gastroenterology;;   CARDIAC CATHETERIZATION N/A 05/08/2015   Procedure: Left Heart Cath and Coronary Angiography;  Surgeon: Corky Crafts, MD;  Location: [facility] INVASIVE CV LAB;  Service: Cardiovascular;  Laterality: N/A;   CARPAL TUNNEL RELEASE Left    CHOLECYSTECTOMY     COLONOSCOPY  06/27/2005   COLONOSCOPY  10/03/2014   CORONARY STENT INTERVENTION N/A 04/15/2021   Procedure: CORONARY STENT INTERVENTION;  Surgeon: Runell Gess, MD;  Location: [facility] INVASIVE CV LAB;  Service: Cardiovascular;  Laterality: N/A;   ESOPHAGEAL DILATION  05/27/2021   Procedure: ESOPHAGEAL DILATION;  Surgeon: Meridee Score Netty Starring., MD;  Location: [facility];  Service: Gastroenterology;;   ESOPHAGEAL STENT PLACEMENT N/A 05/27/2021   Procedure: ESOPHAGEAL STENT PLACEMENT;  Surgeon: Lemar Lofty., MD;  Location: [facility];  Service: Gastroenterology;  Laterality: N/A;   ESOPHAGOGASTRODUODENOSCOPY  06/27/2005   ESOPHAGOGASTRODUODENOSCOPY N/A 03/19/2021   Procedure: ESOPHAGOGASTRODUODENOSCOPY (EGD);  Surgeon: Sheliah Hatch, De Blanch, MD;  Location: [facility];  Service: General;  Laterality: N/A;   ESOPHAGOGASTRODUODENOSCOPY (EGD) WITH PROPOFOL N/A 05/27/2021   Procedure: ESOPHAGOGASTRODUODENOSCOPY (EGD) WITH PROPOFOL;  Surgeon: Lemar Lofty., MD;  Location: [facility];  Service: Gastroenterology;  Laterality: N/A;    ESOPHAGOGASTRODUODENOSCOPY (EGD) WITH PROPOFOL N/A 07/08/2021   Procedure: ESOPHAGOGASTRODUODENOSCOPY (EGD) WITH PROPOFOL;  Surgeon: Meridee Score Netty Starring., MD;  Location: [facility];  Service: Gastroenterology;  Laterality: N/A;  fluoro   ESOPHAGOGASTRODUODENOSCOPY (EGD) WITH PROPOFOL N/A 08/14/2021   Procedure: ESOPHAGOGASTRODUODENOSCOPY (EGD) WITH PROPOFOL;  Surgeon: Meridee Score Netty Starring., MD;  Location: [facility];  Service: Gastroenterology;  Laterality: N/A;  fluoro Axios stent (20 mm)   GASTRIC ROUX-EN-Y N/A 02/18/2021   Procedure: LAPAROSCOPIC ROUX-EN-Y GASTRIC BYPASS WITH UPPER ENDOSCOPY,;  Surgeon: Sheliah Hatch De Blanch, MD;  Location: [facility];  Service: General;  Laterality: N/A;   HEMORRHOID SURGERY  08/25/1997   IR GJ TUBE CHANGE  03/27/2021   LAPAROSCOPIC INSERTION GASTROSTOMY TUBE N/A 03/20/2021   Procedure: LAPAROSCOPIC INSERTION GASTROSTOMY TUBE;  Surgeon: Rodman Pickle, MD;  Location: [facility];  Service: General;  Laterality: N/A;   LEFT HEART CATH AND CORONARY ANGIOGRAPHY N/A 04/15/2021   Procedure: LEFT HEART CATH AND CORONARY ANGIOGRAPHY;  Surgeon: Runell Gess, MD;  Location: [facility]AS[facility] CV LAB;  Service: Cardiovascular;  Laterality: N/A;  MOUTH SURGERY     removed area which was benign   STENT REMOVAL  07/08/2021   Procedure: STENT REMOVAL;  Surgeon: Lemar Lofty., MD;  Location: [facility];  Service: Gastroenterology;;   TONSILLECTOMY     UPPER GI ENDOSCOPY N/A 03/20/2021   Procedure: UPPER GI ENDOSCOPY;  Surgeon: Sheliah Hatch De Blanch, MD;  Location: [facility];  Service: General;  Laterality: N/A;    Social History:  reports that he quit smoking about 32 years ago. His smoking use included cigarettes. He has a 15.00 pack-year smoking history. He has never used smokeless tobacco. He reports that he does not drink alcohol and does not use drugs.  Allergies  Allergen Reactions   Other Other (See Comments)    Cats    Family History  Problem  Relation Age of Onset   Hypertension Mother    Diabetes Mother    Heart attack Father 52   CAD Father    Hypertension Sister    Diabetes Other    Hypertension Other    Heart disease Other    Colon cancer Neg Hx    Esophageal cancer Neg Hx    Pancreatic cancer Neg Hx    Stomach cancer Neg Hx    Liver disease Neg Hx    Inflammatory bowel disease Neg Hx    Rectal cancer Neg Hx      Prior to Admission medications   Medication Sig Start Date End Date Taking? Authorizing Provider  Acetaminophen (TYLENOL PO) 1,000 mg by Feeding Tube route every 6 (six) hours as needed (pain). Tylenol 500mg /64ml   Yes [provider]  aspirin 81 MG chewable tablet 81 mg daily. Per tube   Yes [provider]  atorvastatin (LIPITOR) 40 MG tablet Take 1 tablet by mouth daily. 07/09/21 10/07/21 Yes Gollan, 10/09/21, MD  cetirizine HCl (ZYRTEC) 1 MG/ML solution Take 10 mLs (10 mg total) by mouth once daily Patient taking differently: Take 5 mg by mouth daily. 05/09/21  Yes   Continuous Blood Gluc Sensor (FREESTYLE LIBRE 2 SENSOR) MISC Use as directed every 14 days 03/29/21  Yes 05/29/21, PA-C  diltiazem (CARDIZEM) 10 mg/ml SOLN oral suspension Place 6 mLs (60 mg total) into feeding tube every 8 (eight) hours as needed. Patient taking differently: Place 60 mg into feeding tube at bedtime. 06/17/21  Yes Gollan, 06/19/21, MD  fluticasone (FLONASE) 50 MCG/ACT nasal spray Place 1 spray into both nostrils daily as needed for allergies or rhinitis.   Yes [provider]  insulin lispro (HUMALOG) 100 UNIT/ML KwikPen Inject 0-20 Units into the skin 3 (three) times daily with meals. (according to sliding scale) Patient taking differently: Inject 3-4 Units into the skin 3 (three) times daily as needed (high blood sugar). 03/29/21  Yes 05/29/21, PA-C  Melatonin 5 MG TBDP 5 mg by Feeding Tube route at bedtime as needed (sleep).   Yes [provider]  Metoprolol Tartrate (FIRST -  METOPROLOL) 10 MG/ML SOLN Take 5 mls via g-tube 2 times a day **refrigerate** 05/20/21  Yes   Nutritional Supplements (FEEDING SUPPLEMENT, OSMOLITE 1.2 CAL,) LIQD Place 1,000 mLs into feeding tube continuous. -Begin with 50 ml Osmolite 1.2 via G-tube every 2 hours (8 feedings total while awake) -Free water flush with 30 ml before and after each bolus As tolerated work up to 100 ml Osmolite 1.2 7 times daily (~every 2 hours). -Continue Prosource TF BID. -3 cartons total of Osmolite 1.2 + Prosource TF BID provides 935  kcals (66% of needs), 61g protein (77% of needs), 112g CHO and 1005 ml H2O. -Free water of 240 ml TID via G-tube or PO -Will meet rest of needs with other liquids/diet and protein supplements Patient taking differently: Place 237 mLs into feeding tube 3 (three) times daily. 03/25/21  Yes Stechschulte, Nickola Major, MD  Omeprazole 20 MG TBDD Give 1 capsule (20 mg) by feeding tube twice daily before a meal. 07/08/21  Yes Mansouraty, Telford Nab., MD  OVER THE COUNTER MEDICATION 30 mLs by Feeding Tube route daily. Bariatric liquid multivitamin   Yes [provider]  OVER THE COUNTER MEDICATION 30 mLs by Feeding Tube route daily. Bariatric b complex liquid   Yes [provider]  potassium chloride 20 MEQ/15ML (10%) SOLN Dilute 15 mls of solution (20 mEq total) as directed and place into feeding tube 2 (two) times daily. 06/25/21  Yes   PRESCRIPTION MEDICATION at bedtime. Bipap   Yes [provider]  Propylene Glycol (SYSTANE BALANCE OP) Place 1 drop into both eyes daily as needed (dry eyes).   Yes [provider]  protein supplement (RESOURCE BENEPROTEIN) POWD Take 1 Scoop by mouth in the morning and at bedtime.   Yes [provider]  SODIUM FLUORIDE 5000 SENSITIVE 1.1-5 % GEL Place 1 application onto teeth daily. 05/16/21  Yes [provider]  ticagrelor (BRILINTA) 90 MG TABS tablet Place 1 tablet (90 mg total) into feeding tube 2 (two) times daily.  07/12/21 10/10/21 Yes Gollan, Kathlene November, MD  Water For Irrigation, Sterile (FREE WATER) SOLN Place 60 mLs into feeding tube every 8 (eight) hours. 03/29/21  Yes Saverio Danker, PA-C  cetirizine HCl (ZYRTEC) 1 MG/ML solution Take 10 mLs (10 mg total) by mouth once daily Patient not taking: Reported on 08/15/2021 06/25/21     nitroGLYCERIN (NITROSTAT) 0.4 MG SL tablet Place 1 tablet (0.4 mg total) under the tongue every 5 (five) minutes as needed for chest pain. 04/25/21 07/24/21  Minna Merritts, MD    Physical Exam: Vitals:   08/15/21 1330 08/15/21 1400 08/15/21 1430 08/15/21 1500  BP: (!) 110/59 (!) 104/54 (!) 115/58 115/67  Pulse: 84 94 93 97  Resp: 17   11  Temp:      TempSrc:      SpO2: 96% 97% 96% 93%  Weight:      Height:        Constitutional: NAD, calm, comfortable, pale Eyes: PERRL, lids and conjunctivae normal ENMT: Mucous membranes are moist. Posterior pharynx clear of any exudate or lesions.Normal dentition.  Neck: normal, supple, no masses, no thyromegaly Respiratory: clear to auscultation bilaterally, no wheezing, no crackles. Normal respiratory effort. No accessory muscle use.  Cardiovascular: Regular rate and rhythm, no murmurs / rubs / gallops. No extremity edema. 2+ pedal pulses. No carotid bruits.  Abdomen: no tenderness, no masses palpated. No hepatosplenomegaly. Bowel sounds positive.  Musculoskeletal: no clubbing / cyanosis. No joint deformity upper and lower extremities. Good ROM, no contractures. Normal muscle tone.  Skin: no rashes, lesions, ulcers. No induration Neurologic: CN 2-12 grossly intact. Sensation intact, DTR normal. Strength 5/5 in all 4.  Psychiatric: Normal judgment and insight. Alert and oriented x 3. Normal mood.    Labs on Admission: I have personally reviewed following labs and imaging studies  CBC: Recent Labs  Lab 08/15/21 0915  WBC 16.3*  HGB 9.7*  HCT 30.7*  MCV 88.5  PLT 0000000   Basic Metabolic Panel: Recent Labs  Lab  08/15/21 0915  NA 137  K 4.7  CL 101  CO2 25  GLUCOSE 254*  BUN 25*  CREATININE 0.74  CALCIUM 8.4*   GFR: Estimated Creatinine Clearance: 128.5 mL/min (by C-G formula based on SCr of 0.74 mg/dL). Liver Function Tests: Recent Labs  Lab 08/15/21 0915  AST 23  ALT 26  ALKPHOS 73  BILITOT 0.6  PROT 5.7*  ALBUMIN 3.0*   No results for input(s): LIPASE, AMYLASE in the last 168 hours. No results for input(s): AMMONIA in the last 168 hours. Coagulation Profile: Recent Labs  Lab 08/15/21 0915  INR 1.2   Cardiac Enzymes: No results for input(s): CKTOTAL, CKMB, CKMBINDEX, TROPONINI in the last 168 hours. BNP (last 3 results) No results for input(s): PROBNP in the last 8760 hours. HbA1C: No results for input(s): HGBA1C in the last 72 hours. CBG: Recent Labs  Lab 08/14/21 0910  GLUCAP 121*   Lipid Profile: No results for input(s): CHOL, HDL, LDLCALC, TRIG, CHOLHDL, LDLDIRECT in the last 72 hours. Thyroid Function Tests: No results for input(s): TSH, T4TOTAL, FREET4, T3FREE, THYROIDAB in the last 72 hours. Anemia Panel: No results for input(s): VITAMINB12, FOLATE, FERRITIN, TIBC, IRON, RETICCTPCT in the last 72 hours. Urine analysis:    Component Value Date/Time   COLORURINE AMBER (A) 03/06/2021 0905   APPEARANCEUR HAZY (A) 03/06/2021 0905   LABSPEC 1.023 03/06/2021 0905   PHURINE 6.0 03/06/2021 0905   GLUCOSEU NEGATIVE 03/06/2021 0905   HGBUR NEGATIVE 03/06/2021 0905   BILIRUBINUR NEGATIVE 03/06/2021 0905   KETONESUR 80 (A) 03/06/2021 0905   PROTEINUR 100 (A) 03/06/2021 0905   UROBILINOGEN 0.2 11/24/2009 1658   NITRITE NEGATIVE 03/06/2021 0905   LEUKOCYTESUR NEGATIVE 03/06/2021 0905    Radiological Exams on Admission: DG Abdomen Acute W/Chest  Result Date: 08/15/2021 CLINICAL DATA:  Post GI procedure.  GI bleed. EXAM: DG ABDOMEN ACUTE WITH 1 VIEW CHEST COMPARISON:  05/27/2021 FINDINGS: Gastrostomy tube projects over the stomach. No free air, organomegaly or  suspicious calcification. Prior cholecystectomy. Gas within mildly prominent large and small bowel loops, likely within normal limits or slightly dilated suggesting ileus. No acute bony abnormality. Visualized lungs clear. IMPRESSION: Gas within slightly prominent small bowel and large bowel loops. This could be within normal limits or reflect early ileus. No evidence of bowel obstruction. Gastrostomy projects over the stomach. No free air. Electronically Signed   By: Rolm Baptise M.D.   On: 08/15/2021 10:33    EKG: Independently reviewed. Sinus rhythm No ischemic changes  Assessment/Plan Principal Problem:   GI bleed Active Problems:   Hyperlipidemia   OSA (obstructive sleep apnea)   DM (diabetes mellitus), type 2 (HCC)   History of Roux-en-Y gastric bypass   Antiplatelet or antithrombotic long-term use   Gastrojejunal anastomotic stricture   Acute blood loss anemia   CAD (coronary artery disease), native coronary artery     GI bleeding. -Patient had endoscopy with GJ anastomosis stent placement for stricture on 12/21 -During procedure, it was noted that he was difficult intubation and may have had some trauma with intubation. -Since his procedure, he has had melena as well as hematemesis -Unclear if bleeding is from GI tract versus pharyngeal trauma during intubation -He has been on Brilinta and may have had persistent oozing -He is currently on Protonix twice a day -Plan will be to transfer to [facility] for further evaluation by his primary GI team since he may need repeat endoscopy -Dr. Laverta Baltimore, EDP also discussed case with Dr. Redmond Baseman on-call for ENT who  will evaluate patient on transfer for any airway bleeding -Keep n.p.o. for now  Acute blood loss anemia -Hemoglobin 9.7 on admission, down from prior values of 14 -Repeat hemoglobin in the emergency room down to 8.8 -With his cardiac history, we will plan on transfusing 1 unit of PRBC since he still has borderline low blood  pressures -Continue to follow serial CBCs  Coronary artery disease -Stent placement in 03/2021 -Plan was for uninterrupted dual antiplatelet therapy with aspirin and Brilinta for 1 year -Unfortunately, due to his ongoing bleeding issues, it appears that risk of continuing antiplatelet therapy would outweigh benefits at this point -Discussed with cardiology, Dr. Harrington Challenger who also agree with holding antiplatelets until bleeding issues can be sorted out.  Diabetes -Chronically on sliding scale insulin -We will continue for now  Obstructive sleep apnea -Continue home BiPAP  DVT prophylaxis: scd  Code Status: full code  Family Communication: discussed with wife at the bedside  Disposition Plan: Admit to [facility] for further management  Consults called: GI, Dr. Rush Landmark, ENT, Dr. Redmond Baseman  Admission status: observation, tele   Kathie Dike MD [facility]   If 7PM-7AM, please contact night-coverage [email or web address]   08/15/2021, 3:53 PM

## 2021-08-15 NOTE — Telephone Encounter (Signed)
This pt was a [facility] pt not [facility], please let Dr Meridee Score know asap

## 2021-08-16 ENCOUNTER — Encounter (HOSPITAL_COMMUNITY)
Admission: EM | Disposition: A | Discharge status: Home / Self Care | Payer: Self-pay | Source: Home / Self Care | Attending: Student

## 2021-08-16 ENCOUNTER — Observation Stay (HOSPITAL_COMMUNITY): Payer: No Typology Code available for payment source | Admitting: Certified Registered Nurse Anesthetist

## 2021-08-16 ENCOUNTER — Encounter (HOSPITAL_COMMUNITY): Payer: Self-pay | Admitting: Internal Medicine

## 2021-08-16 DIAGNOSIS — E785 Hyperlipidemia, unspecified: Secondary | ICD-10-CM | POA: Diagnosis present

## 2021-08-16 DIAGNOSIS — R195 Other fecal abnormalities: Secondary | ICD-10-CM

## 2021-08-16 DIAGNOSIS — K222 Esophageal obstruction: Secondary | ICD-10-CM | POA: Diagnosis present

## 2021-08-16 DIAGNOSIS — I5032 Chronic diastolic (congestive) heart failure: Secondary | ICD-10-CM | POA: Diagnosis present

## 2021-08-16 DIAGNOSIS — K2970 Gastritis, unspecified, without bleeding: Secondary | ICD-10-CM

## 2021-08-16 DIAGNOSIS — B3781 Candidal esophagitis: Secondary | ICD-10-CM | POA: Diagnosis not present

## 2021-08-16 DIAGNOSIS — K625 Hemorrhage of anus and rectum: Secondary | ICD-10-CM

## 2021-08-16 DIAGNOSIS — I2510 Atherosclerotic heart disease of native coronary artery without angina pectoris: Secondary | ICD-10-CM | POA: Diagnosis present

## 2021-08-16 DIAGNOSIS — K642 Third degree hemorrhoids: Secondary | ICD-10-CM | POA: Diagnosis present

## 2021-08-16 DIAGNOSIS — K922 Gastrointestinal hemorrhage, unspecified: Secondary | ICD-10-CM

## 2021-08-16 DIAGNOSIS — G4733 Obstructive sleep apnea (adult) (pediatric): Secondary | ICD-10-CM

## 2021-08-16 DIAGNOSIS — J392 Other diseases of pharynx: Secondary | ICD-10-CM | POA: Diagnosis not present

## 2021-08-16 DIAGNOSIS — E119 Type 2 diabetes mellitus without complications: Secondary | ICD-10-CM | POA: Diagnosis present

## 2021-08-16 DIAGNOSIS — K644 Residual hemorrhoidal skin tags: Secondary | ICD-10-CM | POA: Diagnosis present

## 2021-08-16 DIAGNOSIS — R1312 Dysphagia, oropharyngeal phase: Secondary | ICD-10-CM | POA: Diagnosis present

## 2021-08-16 DIAGNOSIS — I110 Hypertensive heart disease with heart failure: Secondary | ICD-10-CM | POA: Diagnosis present

## 2021-08-16 DIAGNOSIS — I252 Old myocardial infarction: Secondary | ICD-10-CM | POA: Diagnosis not present

## 2021-08-16 DIAGNOSIS — I4891 Unspecified atrial fibrillation: Secondary | ICD-10-CM | POA: Diagnosis present

## 2021-08-16 DIAGNOSIS — Z8249 Family history of ischemic heart disease and other diseases of the circulatory system: Secondary | ICD-10-CM | POA: Diagnosis not present

## 2021-08-16 DIAGNOSIS — D649 Anemia, unspecified: Secondary | ICD-10-CM

## 2021-08-16 DIAGNOSIS — Z87891 Personal history of nicotine dependence: Secondary | ICD-10-CM | POA: Diagnosis not present

## 2021-08-16 DIAGNOSIS — R55 Syncope and collapse: Secondary | ICD-10-CM

## 2021-08-16 DIAGNOSIS — K9189 Other postprocedural complications and disorders of digestive system: Secondary | ICD-10-CM | POA: Diagnosis not present

## 2021-08-16 DIAGNOSIS — D62 Acute posthemorrhagic anemia: Secondary | ICD-10-CM | POA: Diagnosis present

## 2021-08-16 DIAGNOSIS — E669 Obesity, unspecified: Secondary | ICD-10-CM | POA: Diagnosis present

## 2021-08-16 DIAGNOSIS — Z833 Family history of diabetes mellitus: Secondary | ICD-10-CM | POA: Diagnosis not present

## 2021-08-16 DIAGNOSIS — Z955 Presence of coronary angioplasty implant and graft: Secondary | ICD-10-CM | POA: Diagnosis not present

## 2021-08-16 DIAGNOSIS — Z7902 Long term (current) use of antithrombotics/antiplatelets: Secondary | ICD-10-CM | POA: Diagnosis not present

## 2021-08-16 DIAGNOSIS — T884XXA Failed or difficult intubation, initial encounter: Secondary | ICD-10-CM | POA: Diagnosis present

## 2021-08-16 DIAGNOSIS — I77810 Thoracic aortic ectasia: Secondary | ICD-10-CM | POA: Diagnosis present

## 2021-08-16 DIAGNOSIS — K920 Hematemesis: Secondary | ICD-10-CM | POA: Diagnosis present

## 2021-08-16 DIAGNOSIS — Z20822 Contact with and (suspected) exposure to covid-19: Secondary | ICD-10-CM | POA: Diagnosis present

## 2021-08-16 HISTORY — PX: ESOPHAGOGASTRODUODENOSCOPY: SHX5428

## 2021-08-16 HISTORY — PX: FLEXIBLE SIGMOIDOSCOPY: SHX5431

## 2021-08-16 LAB — HEMOGLOBIN A1C
Hgb A1c MFr Bld: 6 % — ABNORMAL HIGH (ref 4.8–5.6)
Mean Plasma Glucose: 125.5 mg/dL

## 2021-08-16 LAB — COMPREHENSIVE METABOLIC PANEL
ALT: 21 U/L (ref 0–44)
AST: 17 U/L (ref 15–41)
Albumin: 2.8 g/dL — ABNORMAL LOW (ref 3.5–5.0)
Alkaline Phosphatase: 64 U/L (ref 38–126)
Anion gap: 4 — ABNORMAL LOW (ref 5–15)
BUN: 20 mg/dL (ref 6–20)
CO2: 29 mmol/L (ref 22–32)
Calcium: 8.4 mg/dL — ABNORMAL LOW (ref 8.9–10.3)
Chloride: 104 mmol/L (ref 98–111)
Creatinine, Ser: 0.78 mg/dL (ref 0.61–1.24)
GFR, Estimated: >60 mL/min (ref >60)
Glucose, Bld: 178 mg/dL — ABNORMAL HIGH (ref 70–99)
Potassium: 3.9 mmol/L (ref 3.5–5.1)
Sodium: 137 mmol/L (ref 135–145)
Total Bilirubin: 0.5 mg/dL (ref 0.3–1.2)
Total Protein: 5.2 g/dL — ABNORMAL LOW (ref 6.5–8.1)

## 2021-08-16 LAB — CBC
HCT: 24.8 % — ABNORMAL LOW (ref 39.0–52.0)
HCT: 24.6 % — ABNORMAL LOW (ref 39.0–52.0)
Hemoglobin: 7.8 g/dL — ABNORMAL LOW (ref 13.0–17.0)
Hemoglobin: 7.9 g/dL — ABNORMAL LOW (ref 13.0–17.0)
MCH: 27 pg (ref 26.0–34.0)
MCH: 27.5 pg (ref 26.0–34.0)
MCHC: 31.5 g/dL (ref 30.0–36.0)
MCHC: 32.1 g/dL (ref 30.0–36.0)
MCV: 85.8 fL (ref 80.0–100.0)
MCV: 85.7 fL (ref 80.0–100.0)
Platelets: 158 10*3/uL (ref 150–400)
Platelets: 152 10*3/uL (ref 150–400)
RBC: 2.89 MIL/uL — ABNORMAL LOW (ref 4.22–5.81)
RBC: 2.87 MIL/uL — ABNORMAL LOW (ref 4.22–5.81)
RDW: 14.7 % (ref 11.5–15.5)
RDW: 14.8 % (ref 11.5–15.5)
WBC: 13.2 10*3/uL — ABNORMAL HIGH (ref 4.0–10.5)
WBC: 11 10*3/uL — ABNORMAL HIGH (ref 4.0–10.5)
nRBC: 0 % (ref 0.0–0.2)
nRBC: 0 % (ref 0.0–0.2)

## 2021-08-16 LAB — HEMOGLOBIN AND HEMATOCRIT, BLOOD
HCT: 26.2 % — ABNORMAL LOW (ref 39.0–52.0)
Hemoglobin: 8.5 g/dL — ABNORMAL LOW (ref 13.0–17.0)

## 2021-08-16 LAB — RETICULOCYTES
Immature Retic Fract: 14 % (ref 2.3–15.9)
RBC.: 2.81 MIL/uL — ABNORMAL LOW (ref 4.22–5.81)
Retic Count, Absolute: 55.6 10*3/uL (ref 19.0–186.0)
Retic Ct Pct: 2 % (ref 0.4–3.1)

## 2021-08-16 LAB — GLUCOSE, CAPILLARY
Glucose-Capillary: 142 mg/dL — ABNORMAL HIGH (ref 70–99)
Glucose-Capillary: 148 mg/dL — ABNORMAL HIGH (ref 70–99)

## 2021-08-16 LAB — FOLATE: Folate: 17.1 ng/mL (ref >5.9)

## 2021-08-16 LAB — IRON AND TIBC
Iron: 22 ug/dL — ABNORMAL LOW (ref 45–182)
Saturation Ratios: 6 % — ABNORMAL LOW (ref 17.9–39.5)
TIBC: 339 ug/dL (ref 250–450)
UIBC: 317 ug/dL

## 2021-08-16 LAB — PREPARE RBC (CROSSMATCH)

## 2021-08-16 LAB — VITAMIN B12: Vitamin B-12: 765 pg/mL (ref 180–914)

## 2021-08-16 LAB — FERRITIN: Ferritin: 15 ng/mL — ABNORMAL LOW (ref 24–336)

## 2021-08-16 SURGERY — EGD (ESOPHAGOGASTRODUODENOSCOPY)
Anesthesia: Monitor Anesthesia Care

## 2021-08-16 MED ORDER — SODIUM CHLORIDE 0.9 % IV SOLN: INTRAVENOUS | Status: DC

## 2021-08-16 MED ORDER — PROPOFOL 10 MG/ML IV BOLUS
INTRAVENOUS | Status: DC | PRN
  Administered 2021-08-16 (×3): 20 mg via INTRAVENOUS

## 2021-08-16 MED ORDER — PROPOFOL 500 MG/50ML IV EMUL
INTRAVENOUS | Status: DC | PRN
  Administered 2021-08-16: 125 ug/kg/min via INTRAVENOUS

## 2021-08-16 MED ORDER — PHENYLEPHRINE 40 MCG/ML (10ML) SYRINGE FOR IV PUSH (FOR BLOOD PRESSURE SUPPORT)
PREFILLED_SYRINGE | INTRAVENOUS | Status: DC | PRN
  Administered 2021-08-16 (×2): 120 ug via INTRAVENOUS

## 2021-08-16 MED ORDER — CLOPIDOGREL BISULFATE 75 MG PO TABS
75.0000 mg | ORAL_TABLET | Freq: Every day | ORAL | Status: DC
  Administered 2021-08-17: 11:00:00 75 mg via ORAL
  Filled 2021-08-16: qty 1

## 2021-08-16 MED ORDER — LACTATED RINGERS IV SOLN
INTRAVENOUS | Status: AC | PRN
  Administered 2021-08-16: 10 mL/h via INTRAVENOUS

## 2021-08-16 MED ORDER — SUCRALFATE 1 GM/10ML PO SUSP
1.0000 g | Freq: Three times a day (TID) | ORAL | Status: DC
  Administered 2021-08-16 – 2021-08-17 (×4): 1 g via ORAL
  Filled 2021-08-16 (×4): qty 10

## 2021-08-16 MED ORDER — SODIUM CHLORIDE 0.9% IV SOLUTION: Freq: Once | INTRAVENOUS | Status: DC

## 2021-08-16 NOTE — Consult Note (Signed)
[facility] Consult: 8:25 AM 08/16/2021  LOS: 0 days    Referring Provider: Dr Cyndia Skeeters  Primary Care Physician:  [name], MD Primary Gastroenterologist:  Dr. Rush Landmark     Reason for Consultation: Hematemesis, melenic/burgundy stool   HPI: Hector Curry is a 60 y.o. male.  PMH obesity.  OSA.  Cardiac stent 03/2021.   Patient on chronic Brilinta.  Obesity.  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  10/15/2020 underwent outpt axios stent placement at the anastomosis, Brilinta, low-dose ASA was not held.  Procedure issues included difficult intubation associated with oozing felt to be ENT/question laryngeal source.  Esophagus cleared of any source of bleeding.  The stenting itself resulted in no bleeding or other issues.   After the procedure and discharged from Endo unit throughout the day he felt the taste of blood in his mouth.  He spit up, no nausea, a "tapeworm" looking clot of blood and smaller amounts through the afternoon.  He felt fine.  When he took his nighttime omeprazole he vomited blood of a moderate amount but once he was finished vomiting there was no further nausea and again he felt okay.  He went to sleep around 2 AM with his CPAP machine in place.  At 7:30 in the morning he got up and was dizzy.  His wife said his pupils looked dilated and he was a bit shaky.  Ultimately he had a few spells of syncope.  He passed melenic/burgundy looking stool.  EMS arrived and transferred him to [facility].  Ultimately transferred to [facility].   Has not received any PRBCs but 1 unit was ordered this morning.  Vital signs variable, as low as 100/52, up to 140s/60s.  Latest BP  103/57.  No tachycardia.  Excellent oxygenation on either room air or CPAP.   No further nausea or status  Hgb baseline between 13 -14.5. Hgb 9.7 yesterday morning, 7.8 less than 20 hours later.  Platelets normal.  WBCs elevated to 17.4 but now 13.2.  INR 1.2. BUN 25 ... 20  ENT evaluation requested.     Past Medical History:  Diagnosis Date   Allergic rhinitis    Atrial fibrillation (HCC)    Coronary artery disease, non-occlusive    a. 05/08/2015 Cath: no significant CAD, LVEF nl-->Med; b. 07/2017 MV: attenuation artifact, no ischemia, EF 65%-->Low risk.   Diabetes mellitus without complication ([place])    Diastolic dysfunction    a. 04/2015 Echo: EF 60-65%, mild MR, mildly dil LA, nl RV fxn, nl PASP; b. 05/2016 Echo: EF 60-65%, no rmwa, Gr1 DD, mildly dil Ao root and asc Ao; c. 07/2017 Echo: EF 55-60%, Ao root 72mm. mildly dil LA; d. 03/2019 Echo: EF 55-60%, nl RV fxn. Mild LAE, Ao root/Asc Ao 54mm.   Dilated aortic root ([place])    a. 07/2017 Echo: 75mm Ao root - mildly dil; b. 03/2019 Echo: Ao root 39mm.   Diverticulosis 10/03/2014   Dysrhythmia  Family history of premature CAD    a. father passed from MI at 77   GERD (gastroesophageal reflux disease)    Hemorrhoids    a. internal hemorrhoids s/p surgery 1999   History of kidney stones    History of tobacco abuse    Hyperlipidemia    Hyperplastic colon polyp 10/03/2014   a. x 2    Hypertension    Inflammatory arthritis    a. CCP antibodies & x-rays negative. Rheumatoid factor 14, felt to be crystaline over RA or psoriatic   Morbid obesity ([place])    a. s/p LAP-BAND   OSA (obstructive sleep apnea)    a. on CPAP   Osteoarthritis    PSVT (paroxysmal supraventricular tachycardia) (HCC)    a. 48 hr Holter 04/2015: NSR w/ rare PVC, short runs of narrow complex tachycardiac, possible atrial tach, longest run 7 beats, PACs noted (2% of all beats 3600 total) they did not seem to correlate w/ significant arrythmia; b. 08/2017 Event monitor:  no significant arrhythmias.    Past Surgical History:  Procedure Laterality Date   BALLOON DILATION N/A 08/14/2021   Procedure: BALLOON DILATION;  Surgeon: Mansouraty, Telford Nab., MD;  Location: [facility];  Service: Gastroenterology;  Laterality: N/A;   BARIATRIC SURGERY     lap band    BILIARY STENT PLACEMENT N/A 08/14/2021   Procedure: AXIOS STENT PLACEMENT;  Surgeon: Irving Copas., MD;  Location: [facility];  Service: Gastroenterology;  Laterality: N/A;   BIOPSY  07/08/2021   Procedure: BIOPSY;  Surgeon: Rush Landmark Telford Nab., MD;  Location: [facility];  Service: Gastroenterology;;   BIOPSY  08/14/2021   Procedure: BIOPSY;  Surgeon: Irving Copas., MD;  Location: [facility];  Service: Gastroenterology;;   CARDIAC CATHETERIZATION N/A 05/08/2015   Procedure: Left Heart Cath and Coronary Angiography;  Surgeon: Jettie Booze, MD;  Location: [facility];  Service: Cardiovascular;  Laterality: N/A;   CARPAL TUNNEL RELEASE Left    CHOLECYSTECTOMY     COLONOSCOPY  06/27/2005   COLONOSCOPY  10/03/2014   CORONARY STENT INTERVENTION N/A 04/15/2021   Procedure: CORONARY STENT INTERVENTION;  Surgeon: Lorretta Harp, MD;  Location: [facility];  Service: Cardiovascular;  Laterality: N/A;   ESOPHAGEAL DILATION  05/27/2021   Procedure: ESOPHAGEAL DILATION;  Surgeon: Rush Landmark Telford Nab., MD;  Location: [facility];  Service: Gastroenterology;;   ESOPHAGEAL STENT PLACEMENT N/A 05/27/2021   Procedure: ESOPHAGEAL STENT PLACEMENT;  Surgeon: Irving Copas., MD;  Location: [facility];  Service: Gastroenterology;  Laterality: N/A;   ESOPHAGOGASTRODUODENOSCOPY  06/27/2005   ESOPHAGOGASTRODUODENOSCOPY N/A 03/19/2021   Procedure: ESOPHAGOGASTRODUODENOSCOPY (EGD);  Surgeon: Kieth Brightly, Arta Bruce, MD;  Location: [facility];  Service: General;  Laterality: N/A;   ESOPHAGOGASTRODUODENOSCOPY (EGD) WITH PROPOFOL N/A 05/27/2021   Procedure:  ESOPHAGOGASTRODUODENOSCOPY (EGD) WITH PROPOFOL;  Surgeon: Irving Copas., MD;  Location: [facility];  Service: Gastroenterology;  Laterality: N/A;   ESOPHAGOGASTRODUODENOSCOPY (EGD) WITH PROPOFOL N/A 07/08/2021   Procedure: ESOPHAGOGASTRODUODENOSCOPY (EGD) WITH PROPOFOL;  Surgeon: Rush Landmark Telford Nab., MD;  Location: [facility];  Service: Gastroenterology;  Laterality: N/A;  fluoro   ESOPHAGOGASTRODUODENOSCOPY (EGD) WITH PROPOFOL N/A 08/14/2021   Procedure: ESOPHAGOGASTRODUODENOSCOPY (EGD) WITH PROPOFOL;  Surgeon: Rush Landmark Telford Nab., MD;  Location: [facility];  Service: Gastroenterology;  Laterality: N/A;  fluoro Axios stent (20 mm)   GASTRIC ROUX-EN-Y N/A 02/18/2021   Procedure: LAPAROSCOPIC ROUX-EN-Y GASTRIC BYPASS WITH UPPER ENDOSCOPY,;  Surgeon: Kieth Brightly Arta Bruce, MD;  Location: [facility];  Service: General;  Laterality:  N/A;   HEMORRHOID SURGERY  08/25/1997   IR GJ TUBE CHANGE  03/27/2021   LAPAROSCOPIC INSERTION GASTROSTOMY TUBE N/A 03/20/2021   Procedure: LAPAROSCOPIC INSERTION GASTROSTOMY TUBE;  Surgeon: Kieth Brightly Arta Bruce, MD;  Location: [facility];  Service: General;  Laterality: N/A;   LEFT HEART CATH AND CORONARY ANGIOGRAPHY N/A 04/15/2021   Procedure: LEFT HEART CATH AND CORONARY ANGIOGRAPHY;  Surgeon: Lorretta Harp, MD;  Location: [facility];  Service: Cardiovascular;  Laterality: N/A;   MOUTH SURGERY     removed area which was benign   STENT REMOVAL  07/08/2021   Procedure: STENT REMOVAL;  Surgeon: Irving Copas., MD;  Location: [facility];  Service: Gastroenterology;;   TONSILLECTOMY     UPPER GI ENDOSCOPY N/A 03/20/2021   Procedure: UPPER GI ENDOSCOPY;  Surgeon: Kieth Brightly Arta Bruce, MD;  Location: [facility];  Service: General;  Laterality: N/A;    Prior to Admission medications   Medication Sig Start Date End Date Taking? Authorizing Provider  Acetaminophen (TYLENOL PO) 1,000 mg by Feeding Tube route every 6 (six) hours as needed (pain).  Tylenol 500mg /22ml   Yes [provider]  aspirin 81 MG chewable tablet 81 mg daily. Per tube   Yes [provider]  atorvastatin (LIPITOR) 40 MG tablet Take 1 tablet by mouth daily. 07/09/21 10/07/21 Yes Gollan, Kathlene November, MD  cetirizine HCl (ZYRTEC) 1 MG/ML solution Take 10 mLs (10 mg total) by mouth once daily Patient taking differently: Take 5 mg by mouth daily. 05/09/21  Yes   Continuous Blood Gluc Sensor (FREESTYLE LIBRE 2 SENSOR) MISC Use as directed every 14 days 03/29/21  Yes Saverio Danker, PA-C  diltiazem (CARDIZEM) 10 mg/ml SOLN oral suspension Place 6 mLs (60 mg total) into feeding tube every 8 (eight) hours as needed. Patient taking differently: Place 60 mg into feeding tube at bedtime. 06/17/21  Yes Gollan, Kathlene November, MD  fluticasone (FLONASE) 50 MCG/ACT nasal spray Place 1 spray into both nostrils daily as needed for allergies or rhinitis.   Yes [provider]  insulin lispro (HUMALOG) 100 UNIT/ML KwikPen Inject 0-20 Units into the skin 3 (three) times daily with meals. (according to sliding scale) Patient taking differently: Inject 3-4 Units into the skin 3 (three) times daily as needed (high blood sugar). 03/29/21  Yes Saverio Danker, PA-C  Melatonin 5 MG TBDP 5 mg by Feeding Tube route at bedtime as needed (sleep).   Yes [provider]  Metoprolol Tartrate (FIRST - METOPROLOL) 10 MG/ML SOLN Take 5 mls via g-tube 2 times a day **refrigerate** 05/20/21  Yes   Nutritional Supplements (FEEDING SUPPLEMENT, OSMOLITE 1.2 CAL,) LIQD Place 1,000 mLs into feeding tube continuous. -Begin with 50 ml Osmolite 1.2 via G-tube every 2 hours (8 feedings total while awake) -Free water flush with 30 ml before and after each bolus As tolerated work up to 100 ml Osmolite 1.2 7 times daily (~every 2 hours). -Continue Prosource TF BID. -3 cartons total of Osmolite 1.2 + Prosource TF BID provides 935 kcals (66% of needs), 61g protein (77% of needs), 112g CHO and 1005 ml  H2O. -Free water of 240 ml TID via G-tube or PO -Will meet rest of needs with other liquids/diet and protein supplements Patient taking differently: Place 237 mLs into feeding tube 3 (three) times daily. 03/25/21  Yes Stechschulte, Nickola Major, MD  Omeprazole 20 MG TBDD Give 1 capsule (20 mg) by feeding tube twice daily before a meal. 07/08/21  Yes Mansouraty, Telford Nab.,  MD  OVER THE COUNTER MEDICATION 30 mLs by Feeding Tube route daily. Bariatric liquid multivitamin   Yes [provider]  OVER THE COUNTER MEDICATION 30 mLs by Feeding Tube route daily. Bariatric b complex liquid   Yes [provider]  potassium chloride 20 MEQ/15ML (10%) SOLN Dilute 15 mls of solution (20 mEq total) as directed and place into feeding tube 2 (two) times daily. 06/25/21  Yes   PRESCRIPTION MEDICATION at bedtime. Bipap   Yes [provider]  Propylene Glycol (SYSTANE BALANCE OP) Place 1 drop into both eyes daily as needed (dry eyes).   Yes [provider]  protein supplement (RESOURCE BENEPROTEIN) POWD Take 1 Scoop by mouth in the morning and at bedtime.   Yes [provider]  SODIUM FLUORIDE 5000 SENSITIVE 1.1-5 % GEL Place 1 application onto teeth daily. 05/16/21  Yes [provider]  ticagrelor (BRILINTA) 90 MG TABS tablet Place 1 tablet (90 mg total) into feeding tube 2 (two) times daily. 07/12/21 10/10/21 Yes Gollan, Tollie Pizza, MD  Water For Irrigation, Sterile (FREE WATER) SOLN Place 60 mLs into feeding tube every 8 (eight) hours. 03/29/21  Yes Barnetta Chapel, PA-C  cetirizine HCl (ZYRTEC) 1 MG/ML solution Take 10 mLs (10 mg total) by mouth once daily Patient not taking: Reported on 08/15/2021 06/25/21     nitroGLYCERIN (NITROSTAT) 0.4 MG SL tablet Place 1 tablet (0.4 mg total) under the tongue every 5 (five) minutes as needed for chest pain. 04/25/21 07/24/21  Antonieta Iba, MD    Scheduled Meds:  sodium chloride   Intravenous Once   sodium chloride   Intravenous  Once   insulin aspart  0-15 Units Subcutaneous Q4H   pantoprazole (PROTONIX) IV  40 mg Intravenous Q12H   Infusions:  sodium chloride 100 mL/hr at 08/15/21 1628   PRN Meds: metoprolol tartrate, ondansetron **OR** ondansetron (ZOFRAN) IV   Allergies as of 08/15/2021 - Review Complete 08/15/2021  Allergen Reaction Noted   Other Other (See Comments) 02/11/2021    Family History  Problem Relation Age of Onset   Hypertension Mother    Diabetes Mother    Heart attack Father 58   CAD Father    Hypertension Sister    Diabetes Other    Hypertension Other    Heart disease Other    Colon cancer Neg Hx    Esophageal cancer Neg Hx    Pancreatic cancer Neg Hx    Stomach cancer Neg Hx    Liver disease Neg Hx    Inflammatory bowel disease Neg Hx    Rectal cancer Neg Hx     Social History   Socioeconomic History   Marital status: Married    Spouse name: Not on file   Number of children: Not on file   Years of education: Not on file   Highest education level: Not on file  Occupational History   Not on file  Tobacco Use   Smoking status: Former    Packs/day: 1.00    Years: 15.00    Pack years: 15.00    Types: Cigarettes    Quit date: 08/05/1989    Years since quitting: 32.0   Smokeless tobacco: Never  Vaping Use   Vaping Use: Never used  Substance and Sexual Activity   Alcohol use: Never   Drug use: Never   Sexual activity: Not on file  Other Topics Concern   Not on file  Social History Narrative   Not on file  Social Determinants of Health   Financial Resource Strain: Not on file  Food Insecurity: Not on file  Transportation Needs: Not on file  Physical Activity: Not on file  Stress: Not on file  Social Connections: Not on file  Intimate Partner Violence: Not on file    REVIEW OF SYSTEMS: Constitutional: Normally has good energy but did get dizzy and syncopized yesterday. ENT:  No nose bleeds Pulm: No shortness of breath or cough CV:  No palpitations, no  LE edema.  No angina. GU:  No hematuria, no frequency GI: See HPI. Heme: Other than the bleeding described above, no unusual or excessive bleeding or bruising. Transfusions: None but 1 PRBC is ordered. Neuro: Syncope as per HPI.  No headaches, no peripheral tingling or numbness Derm:  No itching, no rash or sores.  Endocrine:  No sweats or chills.  No polyuria or dysuria Immunization: Reviewed. Travel: Not queried.   PHYSICAL EXAM: Vital signs in last 24 hours: Vitals:   08/16/21 0611 08/16/21 0819  BP: 121/70 (!) 103/57  Pulse: 83 80  Resp: 16 19  Temp: 97.8 F (36.6 C) 98.2 F (36.8 C)  SpO2: 98% 95%   Wt Readings from Last 3 Encounters:  08/15/21 105.2 kg  08/14/21 105.2 kg  07/08/21 111.1 kg    General: Pleasant, alert, comfortable, well-appearing gentleman.  Provides excellent history with his wife alongside him. Head: No facial asymmetry, swelling.  No signs of head trauma. Eyes: Scleral icterus.  No conjunctival pallor.  EOMI. Ears: No hearing deficit Nose: No congestion, no discharge. Mouth: Dark red area of mucosa in the pharynx.  No active bleeding or presence of blood.  Tongue midline. Neck: No JVD, no masses, no thyromegaly Lungs: Clear bilaterally.  No labored breathing. Heart: RRR.  No MRG. Abdomen: Soft without tenderness.  No HSM, masses, bruits, hernias.   Rectal: Deferred Musc/Skeltl: No gross joint deformities Extremities: No CCE. Neurologic: Alert.  Oriented x3.  Moves all 4 limbs without tremor, strength not tested but grossly not weak and no deficits. Skin: No rash, no sores, no telangiectasia Tattoos: None observed Nodes: No cervical adenopathy Psych: Calm, pleasant, cooperative.  Fluid speech.  Intake/Output from previous day: 12/22 0701 - 12/23 0700 In: 500 [IV Piggyback:500] Out: -  Intake/Output this shift: No intake/output data recorded.  LAB RESULTS: Recent Labs    08/15/21 0915 08/15/21 1550 08/16/21 0236  WBC 16.3* 17.4*  13.2*  HGB 9.7* 8.8* 7.8*  HCT 30.7* 27.6* 24.8*  PLT 249 218 158   BMET Lab Results  Component Value Date   NA 137 08/16/2021   NA 137 08/15/2021   NA 137 05/03/2021   K 3.9 08/16/2021   K 4.7 08/15/2021   K 4.0 05/03/2021   CL 104 08/16/2021   CL 101 08/15/2021   CL 102 05/03/2021   CO2 29 08/16/2021   CO2 25 08/15/2021   CO2 27 05/03/2021   GLUCOSE 178 (H) 08/16/2021   GLUCOSE 254 (H) 08/15/2021   GLUCOSE 148 (H) 05/03/2021   BUN 20 08/16/2021   BUN 25 (H) 08/15/2021   BUN 6 05/03/2021   CREATININE 0.78 08/16/2021   CREATININE 0.74 08/15/2021   CREATININE 0.60 05/03/2021   CALCIUM 8.4 (L) 08/16/2021   CALCIUM 8.4 (L) 08/15/2021   CALCIUM 9.4 05/03/2021   LFT Recent Labs    08/15/21 0915 08/16/21 0236  PROT 5.7* 5.2*  ALBUMIN 3.0* 2.8*  AST 23 17  ALT 26 21  ALKPHOS 73 64  BILITOT  0.6 0.5   PT/INR Lab Results  Component Value Date   INR 1.2 08/15/2021   INR 1.2 (H) 05/03/2021   INR 1.2 04/15/2021   Hepatitis Panel No results for input(s): HEPBSAG, HCVAB, HEPAIGM, HEPBIGM in the last 72 hours. C-Diff No components found for: CDIFF Lipase     Component Value Date/Time   LIPASE 84 (H) 03/09/2021 0443    Drugs of Abuse  No results found for: LABOPIA, COCAINSCRNUR, LABBENZ, AMPHETMU, THCU, LABBARB   RADIOLOGY STUDIES: DG Abdomen Acute W/Chest  Result Date: 08/15/2021 CLINICAL DATA:  Post GI procedure.  GI bleed. EXAM: DG ABDOMEN ACUTE WITH 1 VIEW CHEST COMPARISON:  05/27/2021 FINDINGS: Gastrostomy tube projects over the stomach. No free air, organomegaly or suspicious calcification. Prior cholecystectomy. Gas within mildly prominent large and small bowel loops, likely within normal limits or slightly dilated suggesting ileus. No acute bony abnormality. Visualized lungs clear. IMPRESSION: Gas within slightly prominent small bowel and large bowel loops. This could be within normal limits or reflect early ileus. No evidence of bowel obstruction.  Gastrostomy projects over the stomach. No free air. Electronically Signed   By: Rolm Baptise M.D.   On: 08/15/2021 10:33      IMPRESSION:   CGE, melena/bloody stool.  Strong suspicion for ENT source after difficult intubation for EGD 12/21.    Hx Roux-en-Y anastomotic stricture, latest intervention was Axios stent placement across stricture  Chronic Brilinta.  Cardiac stenting 03/2021.  Brilinta was not held for EGD earlier this week.  Last dose 08/14/2021 at 6:30 PM  Blood loss anemia.  1 PRBC, his first,  ordered at 8 AM today.    PLAN:       Await ENT evaluation.  EGD this AM.     Azucena Freed  08/16/2021, 8:25 AM Phone [phone]

## 2021-08-16 NOTE — Op Note (Signed)
[facility] Patient Name: Hector Curry Procedure Date : 08/16/2021 MRN: [id number] Attending MD: Justice Britain , MD Date of Birth: 04-14-1961 CSN: [id number] Age: 60 Admit Type: Inpatient Procedure:                Flexible Sigmoidoscopy Indications:              Rectal hemorrhage, Hematochezia, Melena, Rule out                            lower source of bleeding (no bleeding/BMs x 24                            hours) Providers:                Justice Britain, MD, Mariana Arn, Tyna Jaksch Technician Referring MD:             [facility] Medicines:                Monitored Anesthesia Care Complications:            No immediate complications. Estimated Blood Loss:     Estimated blood loss: none. Procedure:                Pre-Anesthesia Assessment:                           - Prior to the procedure, a History and Physical                            was performed, and patient medications and                            allergies were reviewed. The patient's tolerance of                            previous anesthesia was also reviewed. The risks                            and benefits of the procedure and the sedation                            options and risks were discussed with the patient.                            All questions were answered, and informed consent                            was obtained. Prior Anticoagulants: The patient has                            taken Brilanta, last dose was 2 days prior to                            procedure.  ASA Grade Assessment: III - A patient                            with severe systemic disease. After reviewing the                            risks and benefits, the patient was deemed in                            satisfactory condition to undergo the procedure.                           After obtaining informed consent, the scope was                            passed under  direct vision. The GIF-H190 ([id number])                            Olympus endoscope was introduced through the anus                            and advanced to the the sigmoid colon. The flexible                            sigmoidoscopy was accomplished without difficulty.                            The patient tolerated the procedure. The quality of                            the bowel preparation was poor. Scope In: Scope Out: Findings:      The digital rectal exam findings include hemorrhoids. Pertinent       negatives include no palpable rectal lesions.      Maroon appearing stool admixed with hematin (altered       blood/coffee-ground-like material) was found in the rectum, in the       recto-sigmoid colon and in the sigmoid colon. Lavage of the area was       performed using copious amounts, resulting in incomplete clearance with       continued poor visualization.      Non-bleeding non-thrombosed prolapsed external and internal hemorrhoids       were found during retroflexion, during perianal exam and during digital       exam. The hemorrhoids were Grade III (internal hemorrhoids that prolapse       but require manual reduction). Impression:               - Preparation of the colon was poor.                           - Hemorrhoids found on digital rectal exam.                           - Blood in the rectum, in the recto-sigmoid colon  and in the sigmoid colon.                           - Non-bleeding non-thrombosed prolapsed external                            and internal hemorrhoids. Recommendation:           - The patient will be observed post-procedure,                            until all discharge criteria are met.                           - Return patient to hospital ward for ongoing care.                           - Clear liquid diet.                           - Monitor for signs/symptoms of bleeding,                            perforation, and  infection. If issues please call                            our number to get further assistance as needed.                           - The type of marroon appearing stool noted seems                            to be out of proportion to the findings on the                            Upper GI endoscopy. It seems hard to believe that                            by happenstance after his endoscopy that he would                            have a lower GI source for bleeding, but this is                            not overtly melena currently. I will discuss with                            patient/wife consideration of inpatient colonoscopy                            (though it would really only be diagnostic since I                            cannot remove polyps without him being  off                            [place] for 5 full days.                           - Although not ideal, since it has been less than 6                            months since his PCI, it would probably be ideal to                            hold anti-PLT therapy for at least another 48 hours                            (72 hours total), though that will have to be a                            discussion with our Cardiology colleagues as well.                           - Trend Hgb/Hct.                           - Recommend IV Iron x 1 dose while inhouse.                           - Anusol suppositories nightly x 1 week.                           - The findings and recommendations were discussed                            with the patient.                           - The findings and recommendations were discussed                            with the patient's family.                           - The findings and recommendations were discussed                            with the referring physician. Procedure Code(s):        --- Professional ---                           [phone], Sigmoidoscopy, flexible; diagnostic,                             including collection of specimen(s) by brushing or  washing, when performed (separate procedure) Diagnosis Code(s):        --- Professional ---                           K64.2, Third degree hemorrhoids                           K62.5, Hemorrhage of anus and rectum                           K92.2, Gastrointestinal hemorrhage, unspecified                           K92.1, Melena (includes Hematochezia) CPT copyright 2019 American [employer]. All rights reserved. The codes documented in this report are preliminary and upon coder review may  be revised to meet current compliance requirements. Justice Britain, MD 08/16/2021 11:12:00 AM Number of Addenda: 0

## 2021-08-16 NOTE — Progress Notes (Signed)
Patient is refusing FSBS. He has his own glucose monitoring device. Based on his device, his glucose level is 196. Coverage administered based on his monitoring device level.

## 2021-08-16 NOTE — Transfer of Care (Signed)
Immediate Anesthesia Transfer of Care Note  Patient: TEMILOLUWA LAREDO  Procedure(s) Performed: ESOPHAGOGASTRODUODENOSCOPY (EGD) FLEXIBLE SIGMOIDOSCOPY  Patient Location: PACU  Anesthesia Type:MAC  Level of Consciousness: awake and alert   Airway & Oxygen Therapy: Patient Spontanous Breathing and Patient connected to nasal cannula oxygen  Post-op Assessment: Report given to RN and Post -op Vital signs reviewed and stable  Post vital signs: Reviewed and stable  Last Vitals:  Vitals Value Taken Time  BP 94/53 08/16/21 1056  Temp    Pulse 83 08/16/21 1057  Resp 17 08/16/21 1057  SpO2 97 % 08/16/21 1057  Vitals shown include unvalidated device data.  Last Pain:  Vitals:   08/16/21 0941  TempSrc: Temporal  PainSc: 0-No pain         Complications: No notable events documented.

## 2021-08-16 NOTE — Progress Notes (Signed)
Patient checked his glucose level with his device, result=199. Coverage administered based on 199 glucose level

## 2021-08-16 NOTE — Anesthesia Preprocedure Evaluation (Signed)
Anesthesia Evaluation  Patient identified by MRN, date of birth, ID band Patient awake    Reviewed: Allergy & Precautions, H&P , NPO status , Patient's Chart, lab work & pertinent test results  Airway Mallampati: II   Neck ROM: full    Dental   Pulmonary sleep apnea , former smoker,    breath sounds clear to auscultation       Cardiovascular hypertension, + CAD and + Past MI  + dysrhythmias  Rhythm:regular Rate:Normal     Neuro/Psych    GI/Hepatic GERD  ,  Endo/Other  diabetes  Renal/GU      Musculoskeletal  (+) Arthritis ,   Abdominal   Peds  Hematology  (+) Blood dyscrasia, anemia ,   Anesthesia Other Findings   Reproductive/Obstetrics                             Anesthesia Physical Anesthesia Plan  ASA: 3  Anesthesia Plan: MAC   Post-op Pain Management:    Induction: Intravenous  PONV Risk Score and Plan: 1 and Propofol infusion and Treatment may vary due to age or medical condition  Airway Management Planned: Nasal Cannula  Additional Equipment:   Intra-op Plan:   Post-operative Plan:   Informed Consent: I have reviewed the patients History and Physical, chart, labs and discussed the procedure including the risks, benefits and alternatives for the proposed anesthesia with the patient or authorized representative who has indicated his/her understanding and acceptance.     Dental advisory given  Plan Discussed with: CRNA, Anesthesiologist and Surgeon  Anesthesia Plan Comments:         Anesthesia Quick Evaluation

## 2021-08-16 NOTE — Op Note (Addendum)
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 Estimated Blood Loss:     Estimated blood loss: none. Procedure:                Pre-Anesthesia Assessment:                           - Prior to the procedure, a History and Physical                            was performed, and patient medications and                            allergies were reviewed. The patient's tolerance of                            previous anesthesia was also reviewed. The risks                            and benefits of the procedure and the sedation                            options and risks were discussed with the patient.                            All questions were answered, and informed consent  was obtained. Prior Anticoagulants: The patient has                            taken Brilanta, last dose was 2 days prior to                            procedure. ASA Grade Assessment: III - A patient                            with severe systemic disease. After reviewing the                            risks and benefits, the  patient was deemed in                            satisfactory condition to undergo the procedure.                           After obtaining informed consent, the endoscope was                            passed under direct vision. Throughout the                            procedure, the patient's blood pressure, pulse, and                            oxygen saturations were monitored continuously. The                            [id number]-[id number] Olympus endoscope was introduced                            through the mouth, and advanced to the jejunum. The                            upper GI endoscopy was accomplished without                            difficulty. The patient tolerated the procedure. Scope In: Scope Out: Findings:      The posterior oropharynx is erythematous.      No gross lesions were noted in the entire esophagus.      An AXIOS metal stent was found at the anastomosis. This has opened up       since the procedure as the adult endoscope passed with ease through the       area.      Surrounding the AXIOS stent proximally and distally to the stricture ,       is what would be expected localized mucosal changes. These changes were       characterized by granularity and inflammation. On the proximal end,       there was a dark purplish region without overt visible vessel or       bleeding. I tried to irritate the stent region  by moving and flushing       fluid but saw no active bleeding/oozing occur.      The examined jejunum was normal. Impression:               - The posterior oropharynx is erythematous.                           - No gross lesions in esophagus.                           - Pre-existing AXIOS stent found at anastomosis. As                            noted above, there is expected inflammation around                            the area of significant narrowing but this makes                            sense with the stent expanding. There is a small                             area of purple tissue without overt visible vessel                            present. Moving stent around and washing within it                            did not cause any bleeding.                           - Normal examined jejunum. Recommendation:           - I cannot say with [id number] certainty that the                            stricture dilating even with this fully covered                            AXIOS stent was not the reason for the patient's                            significant bleeding.                           - Continue PPI BID for now.                           - Continue Carafate QAC + QHS for next month.                           - ENT consultation recommended.                           - Proceed to Flexible Sigmoidoscopy to  see if                            anyway this was lower GI related bleeding by                            happenstance.                           - Anti-PLT therapy discussion on Flexible                            sigmoidoscopy note.                           - Observe patient's clinical course.                           - Transfusion as per medical service.                           - The findings and recommendations were discussed                            with the patient.                           - The findings and recommendations were discussed                            with the patient's family.                           - The findings and recommendations were discussed                            with the referring physician. Procedure Code(s):        --- Professional ---                           [phone], Esophagogastroduodenoscopy, flexible,                            transoral; diagnostic, including collection of                            specimen(s) by brushing or washing, when performed                            (separate procedure) Diagnosis Code(s):        --- Professional ---                            J39.2, Other diseases of pharynx                           Z97.8, Presence of other specified devices  K31.89, Other diseases of stomach and duodenum                           K29.70, Gastritis, unspecified, without bleeding                           D62, Acute posthemorrhagic anemia                           K92.2, Gastrointestinal hemorrhage, unspecified                           K95.89, Other complications of other bariatric                            procedure CPT copyright 2019 American Medical Association. All rights reserved. The codes documented in this report are preliminary and upon coder review may  be revised to meet current compliance requirements. Corliss Parish, MD 08/16/2021 11:05:51 AM Number of Addenda: 0

## 2021-08-16 NOTE — Interval H&P Note (Signed)
History and Physical Interval Note:  08/16/2021 10:21 AM  Hector Curry  has presented today for surgery, with the diagnosis of Acute blood loss anemia.  The various methods of treatment have been discussed with the patient and family. After consideration of risks, benefits and other options for treatment, the patient has consented to  Procedure(s): ESOPHAGOGASTRODUODENOSCOPY (EGD) (N/A) FLEXIBLE SIGMOIDOSCOPY (N/A) as a surgical intervention.  The patient's history has been reviewed, patient examined, no change in status, stable for surgery.  I have reviewed the patient's chart and labs.  Questions were answered to the patient's satisfaction.     [employer]

## 2021-08-16 NOTE — H&P (View-Only) (Signed)
[facility] Consult: 8:25 AM 08/16/2021  LOS: 0 days    Referring Provider: Dr Cyndia Skeeters  Primary Care Physician:  [name], MD Primary Gastroenterologist:  Dr. Rush Landmark     Reason for Consultation: Hematemesis, melenic/burgundy stool   HPI: Hector Curry is a 60 y.o. male.  PMH obesity.  OSA.  Cardiac stent 03/2021.   Patient on chronic Brilinta.  Obesity.  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  10/15/2020 underwent outpt axios stent placement at the anastomosis, Brilinta, low-dose ASA was not held.  Procedure issues included difficult intubation associated with oozing felt to be ENT/question laryngeal source.  Esophagus cleared of any source of bleeding.  The stenting itself resulted in no bleeding or other issues.   After the procedure and discharged from Endo unit throughout the day he felt the taste of blood in his mouth.  He spit up, no nausea, a "tapeworm" looking clot of blood and smaller amounts through the afternoon.  He felt fine.  When he took his nighttime omeprazole he vomited blood of a moderate amount but once he was finished vomiting there was no further nausea and again he felt okay.  He went to sleep around 2 AM with his CPAP machine in place.  At 7:30 in the morning he got up and was dizzy.  His wife said his pupils looked dilated and he was a bit shaky.  Ultimately he had a few spells of syncope.  He passed melenic/burgundy looking stool.  EMS arrived and transferred him to [facility].  Ultimately transferred to [facility].   Has not received any PRBCs but 1 unit was ordered this morning.  Vital signs variable, as low as 100/52, up to 140s/60s.  Latest BP  103/57.  No tachycardia.  Excellent oxygenation on either room air or CPAP.   No further nausea or status  Hgb baseline between 13 -14.5. Hgb 9.7 yesterday morning, 7.8 less than 20 hours later.  Platelets normal.  WBCs elevated to 17.4 but now 13.2.  INR 1.2. BUN 25 ... 20  ENT evaluation requested.     Past Medical History:  Diagnosis Date   Allergic rhinitis    Atrial fibrillation (HCC)    Coronary artery disease, non-occlusive    a. 05/08/2015 Cath: no significant CAD, LVEF nl-->Med; b. 07/2017 MV: attenuation artifact, no ischemia, EF 65%-->Low risk.   Diabetes mellitus without complication ([place])    Diastolic dysfunction    a. 04/2015 Echo: EF 60-65%, mild MR, mildly dil LA, nl RV fxn, nl PASP; b. 05/2016 Echo: EF 60-65%, no rmwa, Gr1 DD, mildly dil Ao root and asc Ao; c. 07/2017 Echo: EF 55-60%, Ao root 50mm. mildly dil LA; d. 03/2019 Echo: EF 55-60%, nl RV fxn. Mild LAE, Ao root/Asc Ao 67mm.   Dilated aortic root ([place])    a. 07/2017 Echo: 3mm Ao root - mildly dil; b. 03/2019 Echo: Ao root 71mm.   Diverticulosis 10/03/2014   Dysrhythmia  Family history of premature CAD    a. father passed from MI at 22   GERD (gastroesophageal reflux disease)    Hemorrhoids    a. internal hemorrhoids s/p surgery 1999   History of kidney stones    History of tobacco abuse    Hyperlipidemia    Hyperplastic colon polyp 10/03/2014   a. x 2    Hypertension    Inflammatory arthritis    a. CCP antibodies & x-rays negative. Rheumatoid factor 14, felt to be crystaline over RA or psoriatic   Morbid obesity ([place])    a. s/p LAP-BAND   OSA (obstructive sleep apnea)    a. on CPAP   Osteoarthritis    PSVT (paroxysmal supraventricular tachycardia) (HCC)    a. 48 hr Holter 04/2015: NSR w/ rare PVC, short runs of narrow complex tachycardiac, possible atrial tach, longest run 7 beats, PACs noted (2% of all beats 3600 total) they did not seem to correlate w/ significant arrythmia; b. 08/2017 Event monitor:  no significant arrhythmias.    Past Surgical History:  Procedure Laterality Date   BALLOON DILATION N/A 08/14/2021   Procedure: BALLOON DILATION;  Surgeon: Mansouraty, Telford Nab., MD;  Location: [facility];  Service: Gastroenterology;  Laterality: N/A;   BARIATRIC SURGERY     lap band    BILIARY STENT PLACEMENT N/A 08/14/2021   Procedure: AXIOS STENT PLACEMENT;  Surgeon: Irving Copas., MD;  Location: [facility];  Service: Gastroenterology;  Laterality: N/A;   BIOPSY  07/08/2021   Procedure: BIOPSY;  Surgeon: Rush Landmark Telford Nab., MD;  Location: [facility];  Service: Gastroenterology;;   BIOPSY  08/14/2021   Procedure: BIOPSY;  Surgeon: Irving Copas., MD;  Location: [facility];  Service: Gastroenterology;;   CARDIAC CATHETERIZATION N/A 05/08/2015   Procedure: Left Heart Cath and Coronary Angiography;  Surgeon: Jettie Booze, MD;  Location: [facility];  Service: Cardiovascular;  Laterality: N/A;   CARPAL TUNNEL RELEASE Left    CHOLECYSTECTOMY     COLONOSCOPY  06/27/2005   COLONOSCOPY  10/03/2014   CORONARY STENT INTERVENTION N/A 04/15/2021   Procedure: CORONARY STENT INTERVENTION;  Surgeon: Lorretta Harp, MD;  Location: [facility] [facility];  Service: Cardiovascular;  Laterality: N/A;   ESOPHAGEAL DILATION  05/27/2021   Procedure: ESOPHAGEAL DILATION;  Surgeon: Rush Landmark Telford Nab., MD;  Location: [facility];  Service: Gastroenterology;;   ESOPHAGEAL STENT PLACEMENT N/A 05/27/2021   Procedure: ESOPHAGEAL STENT PLACEMENT;  Surgeon: Irving Copas., MD;  Location: [facility];  Service: Gastroenterology;  Laterality: N/A;   ESOPHAGOGASTRODUODENOSCOPY  06/27/2005   ESOPHAGOGASTRODUODENOSCOPY N/A 03/19/2021   Procedure: ESOPHAGOGASTRODUODENOSCOPY (EGD);  Surgeon: Kieth Brightly, Arta Bruce, MD;  Location: [facility];  Service: General;  Laterality: N/A;   ESOPHAGOGASTRODUODENOSCOPY (EGD) WITH PROPOFOL N/A 05/27/2021   Procedure:  ESOPHAGOGASTRODUODENOSCOPY (EGD) WITH PROPOFOL;  Surgeon: Irving Copas., MD;  [facility]: [facility];  Service: Gastroenterology;  Laterality: N/A;   ESOPHAGOGASTRODUODENOSCOPY (EGD) WITH PROPOFOL N/A 07/08/2021   Procedure: ESOPHAGOGASTRODUODENOSCOPY (EGD) WITH PROPOFOL;  Surgeon: Rush Landmark Telford Nab., MD;  Location: [facility];  Service: Gastroenterology;  Laterality: N/A;  fluoro   ESOPHAGOGASTRODUODENOSCOPY (EGD) WITH PROPOFOL N/A 08/14/2021   Procedure: ESOPHAGOGASTRODUODENOSCOPY (EGD) WITH PROPOFOL;  Surgeon: Rush Landmark Telford Nab., MD;  Location: [facility];  Service: Gastroenterology;  Laterality: N/A;  fluoro Axios stent (20 mm)   GASTRIC ROUX-EN-Y N/A 02/18/2021   Procedure: LAPAROSCOPIC ROUX-EN-Y GASTRIC BYPASS WITH UPPER ENDOSCOPY,;  Surgeon: Kieth Brightly Arta Bruce, MD;  Location: [facility];  Service: General;  Laterality:  N/A;   HEMORRHOID SURGERY  08/25/1997   IR GJ TUBE CHANGE  03/27/2021   LAPAROSCOPIC INSERTION GASTROSTOMY TUBE N/A 03/20/2021   Procedure: LAPAROSCOPIC INSERTION GASTROSTOMY TUBE;  Surgeon: Kieth Brightly Arta Bruce, MD;  Location: [facility];  Service: General;  Laterality: N/A;   LEFT HEART CATH AND CORONARY ANGIOGRAPHY N/A 04/15/2021   Procedure: LEFT HEART CATH AND CORONARY ANGIOGRAPHY;  Surgeon: Lorretta Harp, MD;  Location: [place] [facility];  Service: Cardiovascular;  Laterality: N/A;   MOUTH SURGERY     removed area which was benign   STENT REMOVAL  07/08/2021   Procedure: STENT REMOVAL;  Surgeon: Irving Copas., MD;  Location: [facility];  Service: Gastroenterology;;   TONSILLECTOMY     UPPER GI ENDOSCOPY N/A 03/20/2021   Procedure: UPPER GI ENDOSCOPY;  Surgeon: Kieth Brightly Arta Bruce, MD;  Location: [facility];  Service: General;  Laterality: N/A;    Prior to Admission medications   Medication Sig Start Date End Date Taking? Authorizing Provider  Acetaminophen (TYLENOL PO) 1,000 mg by Feeding Tube route every 6 (six) hours as needed (pain).  Tylenol 500mg /80ml   Yes [provider]  aspirin 81 MG chewable tablet 81 mg daily. Per tube   Yes [provider]  atorvastatin (LIPITOR) 40 MG tablet Take 1 tablet by mouth daily. 07/09/21 10/07/21 Yes Gollan, Kathlene November, MD  cetirizine HCl (ZYRTEC) 1 MG/ML solution Take 10 mLs (10 mg total) by mouth once daily Patient taking differently: Take 5 mg by mouth daily. 05/09/21  Yes   Continuous Blood Gluc Sensor (FREESTYLE LIBRE 2 SENSOR) MISC Use as directed every 14 days 03/29/21  Yes Saverio Danker, PA-C  diltiazem (CARDIZEM) 10 mg/ml SOLN oral suspension Place 6 mLs (60 mg total) into feeding tube every 8 (eight) hours as needed. Patient taking differently: Place 60 mg into feeding tube at bedtime. 06/17/21  Yes Gollan, Kathlene November, MD  fluticasone (FLONASE) 50 MCG/ACT nasal spray Place 1 spray into both nostrils daily as needed for allergies or rhinitis.   Yes [provider]  insulin lispro (HUMALOG) 100 UNIT/ML KwikPen Inject 0-20 Units into the skin 3 (three) times daily with meals. (according to sliding scale) Patient taking differently: Inject 3-4 Units into the skin 3 (three) times daily as needed (high blood sugar). 03/29/21  Yes Saverio Danker, PA-C  Melatonin 5 MG TBDP 5 mg by Feeding Tube route at bedtime as needed (sleep).   Yes [provider]  Metoprolol Tartrate (FIRST - METOPROLOL) 10 MG/ML SOLN Take 5 mls via g-tube 2 times a day **refrigerate** 05/20/21  Yes   Nutritional Supplements (FEEDING SUPPLEMENT, OSMOLITE 1.2 CAL,) LIQD Place 1,000 mLs into feeding tube continuous. -Begin with 50 ml Osmolite 1.2 via G-tube every 2 hours (8 feedings total while awake) -Free water flush with 30 ml before and after each bolus As tolerated work up to 100 ml Osmolite 1.2 7 times daily (~every 2 hours). -Continue Prosource TF BID. -3 cartons total of Osmolite 1.2 + Prosource TF BID provides 935 kcals (66% of needs), 61g protein (77% of needs), 112g CHO and 1005 ml  H2O. -Free water of 240 ml TID via G-tube or PO -Will meet rest of needs with other liquids/diet and protein supplements Patient taking differently: Place 237 mLs into feeding tube 3 (three) times daily. 03/25/21  Yes Stechschulte, Nickola Major, MD  Omeprazole 20 MG TBDD Give 1 capsule (20 mg) by feeding tube twice daily before a meal. 07/08/21  Yes Mansouraty, Telford Nab.,  MD  OVER THE COUNTER MEDICATION 30 mLs by Feeding Tube route daily. Bariatric liquid multivitamin   Yes [provider]  OVER THE COUNTER MEDICATION 30 mLs by Feeding Tube route daily. Bariatric b complex liquid   Yes [provider]  potassium chloride 20 MEQ/15ML (10%) SOLN Dilute 15 mls of solution (20 mEq total) as directed and place into feeding tube 2 (two) times daily. 06/25/21  Yes   PRESCRIPTION MEDICATION at bedtime. Bipap   Yes [provider]  Propylene Glycol (SYSTANE BALANCE OP) Place 1 drop into both eyes daily as needed (dry eyes).   Yes [provider]  protein supplement (RESOURCE BENEPROTEIN) POWD Take 1 Scoop by mouth in the morning and at bedtime.   Yes [provider]  SODIUM FLUORIDE 5000 SENSITIVE 1.1-5 % GEL Place 1 application onto teeth daily. 05/16/21  Yes [provider]  ticagrelor (BRILINTA) 90 MG TABS tablet Place 1 tablet (90 mg total) into feeding tube 2 (two) times daily. 07/12/21 10/10/21 Yes Gollan, Tollie Pizza, MD  Water For Irrigation, Sterile (FREE WATER) SOLN Place 60 mLs into feeding tube every 8 (eight) hours. 03/29/21  Yes Barnetta Chapel, PA-C  cetirizine HCl (ZYRTEC) 1 MG/ML solution Take 10 mLs (10 mg total) by mouth once daily Patient not taking: Reported on 08/15/2021 06/25/21     nitroGLYCERIN (NITROSTAT) 0.4 MG SL tablet Place 1 tablet (0.4 mg total) under the tongue every 5 (five) minutes as needed for chest pain. 04/25/21 07/24/21  Antonieta Iba, MD    Scheduled Meds:  sodium chloride   Intravenous Once   sodium chloride   Intravenous  Once   insulin aspart  0-15 Units Subcutaneous Q4H   pantoprazole (PROTONIX) IV  40 mg Intravenous Q12H   Infusions:  sodium chloride 100 mL/hr at 08/15/21 1628   PRN Meds: metoprolol tartrate, ondansetron **OR** ondansetron (ZOFRAN) IV   Allergies as of 08/15/2021 - Review Complete 08/15/2021  Allergen Reaction Noted   Other Other (See Comments) 02/11/2021    Family History  Problem Relation Age of Onset   Hypertension Mother    Diabetes Mother    Heart attack Father 58   CAD Father    Hypertension Sister    Diabetes Other    Hypertension Other    Heart disease Other    Colon cancer Neg Hx    Esophageal cancer Neg Hx    Pancreatic cancer Neg Hx    Stomach cancer Neg Hx    Liver disease Neg Hx    Inflammatory bowel disease Neg Hx    Rectal cancer Neg Hx     Social History   Socioeconomic History   Marital status: Married    Spouse name: Not on file   Number of children: Not on file   Years of education: Not on file   Highest education level: Not on file  Occupational History   Not on file  Tobacco Use   Smoking status: Former    Packs/day: 1.00    Years: 15.00    Pack years: 15.00    Types: Cigarettes    Quit date: 08/05/1989    Years since quitting: 32.0   Smokeless tobacco: Never  Vaping Use   Vaping Use: Never used  Substance and Sexual Activity   Alcohol use: Never   Drug use: Never   Sexual activity: Not on file  Other Topics Concern   Not on file  Social History Narrative   Not on file  Social Determinants of Health   Financial Resource Strain: Not on file  Food Insecurity: Not on file  Transportation Needs: Not on file  Physical Activity: Not on file  Stress: Not on file  Social Connections: Not on file  Intimate Partner Violence: Not on file    REVIEW OF SYSTEMS: Constitutional: Normally has good energy but did get dizzy and syncopized yesterday. ENT:  No nose bleeds Pulm: No shortness of breath or cough CV:  No palpitations, no  LE edema.  No angina. GU:  No hematuria, no frequency GI: See HPI. Heme: Other than the bleeding described above, no unusual or excessive bleeding or bruising. Transfusions: None but 1 PRBC is ordered. Neuro: Syncope as per HPI.  No headaches, no peripheral tingling or numbness Derm:  No itching, no rash or sores.  Endocrine:  No sweats or chills.  No polyuria or dysuria Immunization: Reviewed. Travel: Not queried.   PHYSICAL EXAM: Vital signs in last 24 hours: Vitals:   08/16/21 0611 08/16/21 0819  BP: 121/70 (!) 103/57  Pulse: 83 80  Resp: 16 19  Temp: 97.8 F (36.6 C) 98.2 F (36.8 C)  SpO2: 98% 95%   Wt Readings from Last 3 Encounters:  08/15/21 105.2 kg  08/14/21 105.2 kg  07/08/21 111.1 kg    General: Pleasant, alert, comfortable, well-appearing gentleman.  Provides excellent history with his wife alongside him. Head: No facial asymmetry, swelling.  No signs of head trauma. Eyes: Scleral icterus.  No conjunctival pallor.  EOMI. Ears: No hearing deficit Nose: No congestion, no discharge. Mouth: Dark red area of mucosa in the pharynx.  No active bleeding or presence of blood.  Tongue midline. Neck: No JVD, no masses, no thyromegaly Lungs: Clear bilaterally.  No labored breathing. Heart: RRR.  No MRG. Abdomen: Soft without tenderness.  No HSM, masses, bruits, hernias.   Rectal: Deferred Musc/Skeltl: No gross joint deformities Extremities: No CCE. Neurologic: Alert.  Oriented x3.  Moves all 4 limbs without tremor, strength not tested but grossly not weak and no deficits. Skin: No rash, no sores, no telangiectasia Tattoos: None observed Nodes: No cervical adenopathy Psych: Calm, pleasant, cooperative.  Fluid speech.  Intake/Output from previous day: 12/22 0701 - 12/23 0700 In: 500 [IV Piggyback:500] Out: -  Intake/Output this shift: No intake/output data recorded.  LAB RESULTS: Recent Labs    08/15/21 0915 08/15/21 1550 08/16/21 0236  WBC 16.3* 17.4*  13.2*  HGB 9.7* 8.8* 7.8*  HCT 30.7* 27.6* 24.8*  PLT 249 218 158   BMET Lab Results  Component Value Date   NA 137 08/16/2021   NA 137 08/15/2021   NA 137 05/03/2021   K 3.9 08/16/2021   K 4.7 08/15/2021   K 4.0 05/03/2021   CL 104 08/16/2021   CL 101 08/15/2021   CL 102 05/03/2021   CO2 29 08/16/2021   CO2 25 08/15/2021   CO2 27 05/03/2021   GLUCOSE 178 (H) 08/16/2021   GLUCOSE 254 (H) 08/15/2021   GLUCOSE 148 (H) 05/03/2021   BUN 20 08/16/2021   BUN 25 (H) 08/15/2021   BUN 6 05/03/2021   CREATININE 0.78 08/16/2021   CREATININE 0.74 08/15/2021   CREATININE 0.60 05/03/2021   CALCIUM 8.4 (L) 08/16/2021   CALCIUM 8.4 (L) 08/15/2021   CALCIUM 9.4 05/03/2021   LFT Recent Labs    08/15/21 0915 08/16/21 0236  PROT 5.7* 5.2*  ALBUMIN 3.0* 2.8*  AST 23 17  ALT 26 21  ALKPHOS 73 64  BILITOT  0.6 0.5   PT/INR Lab Results  Component Value Date   INR 1.2 08/15/2021   INR 1.2 (H) 05/03/2021   INR 1.2 04/15/2021   Hepatitis Panel No results for input(s): HEPBSAG, HCVAB, HEPAIGM, HEPBIGM in the last 72 hours. C-Diff No components found for: CDIFF Lipase     Component Value Date/Time   LIPASE 84 (H) 03/09/2021 0443    Drugs of Abuse  No results found for: LABOPIA, COCAINSCRNUR, LABBENZ, AMPHETMU, THCU, LABBARB   RADIOLOGY STUDIES: DG Abdomen Acute W/Chest  Result Date: 08/15/2021 CLINICAL DATA:  Post GI procedure.  GI bleed. EXAM: DG ABDOMEN ACUTE WITH 1 VIEW CHEST COMPARISON:  05/27/2021 FINDINGS: Gastrostomy tube projects over the stomach. No free air, organomegaly or suspicious calcification. Prior cholecystectomy. Gas within mildly prominent large and small bowel loops, likely within normal limits or slightly dilated suggesting ileus. No acute bony abnormality. Visualized lungs clear. IMPRESSION: Gas within slightly prominent small bowel and large bowel loops. This could be within normal limits or reflect early ileus. No evidence of bowel obstruction.  Gastrostomy projects over the stomach. No free air. Electronically Signed   By: Rolm Baptise M.D.   On: 08/15/2021 10:33      IMPRESSION:   CGE, melena/bloody stool.  Strong suspicion for ENT source after difficult intubation for EGD 12/21.    Hx Roux-en-Y anastomotic stricture, latest intervention was Axios stent placement across stricture  Chronic Brilinta.  Cardiac stenting 03/2021.  Brilinta was not held for EGD earlier this week.  Last dose 08/14/2021 at 6:30 PM  Blood loss anemia.  1 PRBC, his first,  ordered at 8 AM today.    PLAN:       Await ENT evaluation.  EGD this AM.     Azucena Freed  08/16/2021, 8:25 AM Phone [phone]

## 2021-08-16 NOTE — Consult Note (Addendum)
Cardiology Consult    Patient ID: ROLFE HARTSELL MRN: [id number], DOB/AGE: 1961/05/19   Admit date: 08/15/2021 Date of Consult: 08/16/2021  Primary Physician: Marguarite Arbour, MD Primary Cardiologist: Julien Nordmann, MD Requesting Provider: T. Alanda Slim, MD  Patient Profile    Hector Curry is a 60 y.o. male with a history of CAD, HFpEF, dilated Ao root, palpitations/PSVT, HTN, HL, obesity s/p lap band complicated by esophageal stricture req stenting/dilation, OSA on BiPAP, and FH of premature CAD, who is being seen today for the evaluation of GIB on brilinta, at the request of Dr. Alanda Slim.  Past Medical History   Past Medical History:  Diagnosis Date   Allergic rhinitis    Atrial tachycardia (HCC)    CAD (coronary artery disease)    a. 05/08/2015 Cath: no significant CAD, LVEF nl-->Med; b. 07/2017 MV: attenuation artifact, no ischemia, EF 65%-->Low risk; c. 03/2021 NSTEMI/PCI: LM nl, LAD 80p (3.0x18 Onyx Frontier DES), D1 90 (too small for PCI), LCX nl, RCA nl.   Diabetes mellitus without complication (HCC)    Diastolic dysfunction    a. 04/2015 Echo: EF 60-65%; b. 05/2016 Echo: EF 60-65%, GrI DD; c. 07/2017 Echo: EF 55-60%, Ao root 58mm; d. 03/2019 Echo: EF 55-60%, Ao root/Asc Ao 65mm; e. 03/2021 Echo: EF 50-55%, apical HK, mod asymm basal-septal hypertrophy. Nl RV fxn. Asc Ao 18mm.   Dilated aortic root (HCC)    a. 07/2017 Echo: 55mm Ao root - mildly dil; b. 03/2019 Echo: Ao root 80mm; c. 03/2021 Echo: Asc Ao 45mm.   Diverticulosis 10/03/2014   Dysrhythmia    Esophageal stricture    a.  In setting of lap band June 2022-stricture at the GJ anastomosis requiring gastrostomy tube; b. 05/2021 s/p esoph stenting (44mm); c. 07/2021 s/p esoph stenting (105mm).   Family history of premature CAD    a. father passed from MI at 37   GERD (gastroesophageal reflux disease)    GIB (gastrointestinal bleeding)    a. 07/2021 following esoph stenting.   Hemorrhoids    a. internal hemorrhoids  s/p surgery 1999   History of kidney stones    History of tobacco abuse    Hyperlipidemia    Hyperplastic colon polyp 10/03/2014   a. x 2    Hypertension    Inflammatory arthritis    a. CCP antibodies & x-rays negative. Rheumatoid factor 14, felt to be crystaline over RA or psoriatic   Morbid obesity (HCC)    a. s/p LAP-BAND - complicated by esoph stricture.   OSA (obstructive sleep apnea)    a. on CPAP   Osteoarthritis    PSVT (paroxysmal supraventricular tachycardia) (HCC)    a. 48 hr Holter 04/2015: NSR w/ rare PVC, short runs of narrow complex tachycardiac, possible atrial tach, longest run 7 beats, PACs noted (2% of all beats 3600 total) they did not seem to correlate w/ significant arrythmia; b. 08/2017 Event monitor: no significant arrhythmias.    Past Surgical History:  Procedure Laterality Date   BALLOON DILATION N/A 08/14/2021   Procedure: BALLOON DILATION;  Surgeon: Mansouraty, Netty Starring., MD;  Location: [facility];  Service: Gastroenterology;  Laterality: N/A;   BARIATRIC SURGERY     lap band    BILIARY STENT PLACEMENT N/A 08/14/2021   Procedure: AXIOS STENT PLACEMENT;  Surgeon: Lemar Lofty., MD;  Location: [facility];  Service: Gastroenterology;  Laterality: N/A;   BIOPSY  07/08/2021   Procedure: BIOPSY;  Surgeon: Meridee Score Netty Starring., MD;  Location: [facility];  Service: Gastroenterology;;   BIOPSY  08/14/2021   Procedure: BIOPSY;  Surgeon: Irving Copas., MD;  Location: [facility];  Service: Gastroenterology;;   CARDIAC CATHETERIZATION N/A 05/08/2015   Procedure: Left Heart Cath and Coronary Angiography;  Surgeon: Jettie Booze, MD;  Location: [facility];  Service: Cardiovascular;  Laterality: N/A;   CARPAL TUNNEL RELEASE Left    CHOLECYSTECTOMY     COLONOSCOPY  06/27/2005   COLONOSCOPY  10/03/2014   CORONARY STENT INTERVENTION N/A 04/15/2021   Procedure: CORONARY STENT INTERVENTION;  Surgeon: Lorretta Harp, MD;   Location: [facility];  Service: Cardiovascular;  Laterality: N/A;   ESOPHAGEAL DILATION  05/27/2021   Procedure: ESOPHAGEAL DILATION;  Surgeon: Rush Landmark Telford Nab., MD;  Location: [facility];  Service: Gastroenterology;;   ESOPHAGEAL STENT PLACEMENT N/A 05/27/2021   Procedure: ESOPHAGEAL STENT PLACEMENT;  Surgeon: Irving Copas., MD;  Location: [facility];  Service: Gastroenterology;  Laterality: N/A;   ESOPHAGOGASTRODUODENOSCOPY  06/27/2005   ESOPHAGOGASTRODUODENOSCOPY N/A 03/19/2021   Procedure: ESOPHAGOGASTRODUODENOSCOPY (EGD);  Surgeon: Kieth Brightly, Arta Bruce, MD;  Location: [facility];  Service: General;  Laterality: N/A;   ESOPHAGOGASTRODUODENOSCOPY (EGD) WITH PROPOFOL N/A 05/27/2021   Procedure: ESOPHAGOGASTRODUODENOSCOPY (EGD) WITH PROPOFOL;  Surgeon: Irving Copas., MD;  Location: [facility];  Service: Gastroenterology;  Laterality: N/A;   ESOPHAGOGASTRODUODENOSCOPY (EGD) WITH PROPOFOL N/A 07/08/2021   Procedure: ESOPHAGOGASTRODUODENOSCOPY (EGD) WITH PROPOFOL;  Surgeon: Rush Landmark Telford Nab., MD;  Location: [facility];  Service: Gastroenterology;  Laterality: N/A;  fluoro   ESOPHAGOGASTRODUODENOSCOPY (EGD) WITH PROPOFOL N/A 08/14/2021   Procedure: ESOPHAGOGASTRODUODENOSCOPY (EGD) WITH PROPOFOL;  Surgeon: Rush Landmark Telford Nab., MD;  Location: [facility];  Service: Gastroenterology;  Laterality: N/A;  fluoro Axios stent (20 mm)   GASTRIC ROUX-EN-Y N/A 02/18/2021   Procedure: LAPAROSCOPIC ROUX-EN-Y GASTRIC BYPASS WITH UPPER ENDOSCOPY,;  Surgeon: Kieth Brightly Arta Bruce, MD;  Location: [facility];  Service: General;  Laterality: N/A;   HEMORRHOID SURGERY  08/25/1997   IR [place] TUBE CHANGE  03/27/2021   LAPAROSCOPIC INSERTION GASTROSTOMY TUBE N/A 03/20/2021   Procedure: LAPAROSCOPIC INSERTION GASTROSTOMY TUBE;  Surgeon: Mickeal Skinner, MD;  Location: [facility];  Service: General;  Laterality: N/A;   LEFT HEART CATH AND CORONARY ANGIOGRAPHY N/A 04/15/2021    Procedure: LEFT HEART CATH AND CORONARY ANGIOGRAPHY;  Surgeon: Lorretta Harp, MD;  Location: [facility];  Service: Cardiovascular;  Laterality: N/A;   MOUTH SURGERY     removed area which was benign   STENT REMOVAL  07/08/2021   Procedure: STENT REMOVAL;  Surgeon: Irving Copas., MD;  Location: [facility];  Service: Gastroenterology;;   TONSILLECTOMY     UPPER GI ENDOSCOPY N/A 03/20/2021   Procedure: UPPER GI ENDOSCOPY;  Surgeon: Mickeal Skinner, MD;  Location: [facility];  Service: General;  Laterality: N/A;     Allergies  Allergies  Allergen Reactions   Other Other (See Comments)    Cats    History of Present Illness    60 year old male with above complex past medical history including family history of premature CAD, personal history of CAD, diastolic dysfunction, hypertension, hyperlipidemia, obesity s/p lap band, sleep apnea, dilated aortic root, and palpitations.  Previous cardiac evaluation includes a catheterization in September 2016 revealing no significant CAD.  Stress testing was performed in December 2018 and was low risk without evidence of ischemia.  Regarding history of palpitations, he wore a Holter monitor in 2016 which showed short runs of narrow complex tachycardia.  Palpitations have been managed  with beta-blocker and calcium channel blocker therapy.  No significant arrhythmias were noted on Holter monitoring in 08/2017.    He underwent lap band in [id number] complicated by stricture @ the [place] anastomosis req gastrostomy tube placement in late July.  In August, he presented to the ED w/ chest pain and r/I for NSTEMI.  Cath revealed severe prox LAD and diag dzs.  The LAD was successfully treated w/ a DES.  The diag was felt to be the culprit but was too small for intervention.  He was d/c'd home on asa/brilinta w/ plan for 12 mos of DAPT.  He was doing well @ office visit 8/29.  In the setting of ongoing esophageal stricture with frequent vomiting, patient  underwent esophageal stenting in October.  At that point, he was able to resume a normal diet however, over the past month or so, he has had recurrence of inability to keep food down/vomiting.  On December 21, he underwent repeat esophageal stenting with a 20 mm stent.  Patient notes that upon the drive home, he could already taste some blood in the back of his throat.  On the evening of the 21st, he threw up blood.  He was eventually able to fall asleep and then when he awoke on the 22nd, he was sitting on the side of his bed and his wife noted he seemed disoriented.  He fell onto the floor and apparently lost consciousness for a brief period of time.  He regained consciousness and his wife called EMS.  He felt as though he needed to use the bathroom and upon walking into the bathroom, started having bright red blood per rectum.  He then had a large bloody bowel movement.  He had recurrent presyncope while on the toilet and it was at that point that EMS arrived.  He was taken to [facility] ED where blood pressure was soft in the low 100s.  ECG was unremarkable.  Initial hemoglobin was 9.7.  Patient was admitted.  Home doses of aspirin and Brilinta were held (last dose on the evening of December 21).  He has been seen by GI and underwent repeat EGD and flexible sigmoidoscopy this morning.  No active bleeding was noted on either study.  We have been asked to evaluate the patient related to his dual antiplatelet therapy and duration of holding.  Patient denies any recent chest pain or dyspnea.  Inpatient Medications     sodium chloride   Intravenous Once   sodium chloride   Intravenous Once   insulin aspart  0-15 Units Subcutaneous Q4H   pantoprazole (PROTONIX) IV  40 mg Intravenous Q12H   sucralfate  1 g Oral TID WC & HS    Family History    Family History  Problem Relation Age of Onset   Hypertension Mother    Diabetes Mother    Heart attack Father 88   CAD Father    Hypertension Sister    Diabetes  Other    Hypertension Other    Heart disease Other    Colon cancer Neg Hx    Esophageal cancer Neg Hx    Pancreatic cancer Neg Hx    Stomach cancer Neg Hx    Liver disease Neg Hx    Inflammatory bowel disease Neg Hx    Rectal cancer Neg Hx    He indicated that his mother is alive. He indicated that his father is deceased. He indicated that his sister is alive. He indicated that the status  of his neg hx is unknown.   Social History    Social History   Socioeconomic History   Marital status: Married    Spouse name: Not on file   Number of children: Not on file   Years of education: Not on file   Highest education level: Not on file  Occupational History   Not on file  Tobacco Use   Smoking status: Former    Packs/day: 1.00    Years: 15.00    Pack years: 15.00    Types: Cigarettes    Quit date: 08/05/1989    Years since quitting: 32.0   Smokeless tobacco: Never  Vaping Use   Vaping Use: Never used  Substance and Sexual Activity   Alcohol use: Never   Drug use: Never   Sexual activity: Not on file  Other Topics Concern   Not on file  Social History Narrative   Not on file   Social Determinants of Health   Financial Resource Strain: Not on file  Food Insecurity: Not on file  Transportation Needs: Not on file  Physical Activity: Not on file  Stress: Not on file  Social Connections: Not on file  Intimate Partner Violence: Not on file     Review of Systems    General:  No chills, fever, night sweats or weight changes.  Cardiovascular:  No chest pain, dyspnea on exertion, edema, orthopnea, palpitations, paroxysmal nocturnal dyspnea. Dermatological: No rash, lesions/masses Respiratory: No cough, dyspnea Urologic: No hematuria, dysuria Abdominal:   +++ nausea, +++ vomiting, +++ brbpr, +++ hematemesis Neurologic:  No visual changes, wkns, changes in mental status. All other systems reviewed and are otherwise negative except as noted above.  Physical Exam     Blood pressure (!) 108/48, pulse 81, temperature 98.2 F (36.8 C), temperature source Oral, resp. rate 18, height 6\' 2"  ([id number] m), weight 104.3 kg, SpO2 96 %.  General: Pleasant, NAD Psych: Normal affect. Neuro: Alert and oriented X 3. Moves all extremities spontaneously. HEENT: Normal  Neck: Supple without bruits or JVD. Lungs:  Resp regular and unlabored, CTA. Heart: RRR no s3, s4, or murmurs. Abdomen: Soft, non-tender, non-distended, BS + x 4.  Extremities: No clubbing, cyanosis or edema. DP/PT2+, Radials 2+ and equal bilaterally.  Labs      Lab Results  Component Value Date   WBC 13.2 (H) 08/16/2021   HGB 7.8 (L) 08/16/2021   HCT 24.8 (L) 08/16/2021   MCV 85.8 08/16/2021   PLT 158 08/16/2021    Recent Labs  Lab 08/16/21 0236  NA 137  K 3.9  CL 104  CO2 29  BUN 20  CREATININE 0.78  CALCIUM 8.4*  PROT 5.2*  BILITOT 0.5  ALKPHOS 64  ALT 21  AST 17  GLUCOSE 178*   Lab Results  Component Value Date   CHOL 141 04/15/2021   HDL 26 (L) 04/15/2021   LDLCALC 93 04/15/2021   TRIG 109 04/15/2021      Radiology Studies    DG Abdomen Acute W/Chest  Result Date: 08/15/2021 CLINICAL DATA:  Post GI procedure.  GI bleed. EXAM: DG ABDOMEN ACUTE WITH 1 VIEW CHEST COMPARISON:  05/27/2021 FINDINGS: Gastrostomy tube projects over the stomach. No free air, organomegaly or suspicious calcification. Prior cholecystectomy. Gas within mildly prominent large and small bowel loops, likely within normal limits or slightly dilated suggesting ileus. No acute bony abnormality. Visualized lungs clear. IMPRESSION: Gas within slightly prominent small bowel and large bowel loops. This could be within normal limits  or reflect early ileus. No evidence of bowel obstruction. Gastrostomy projects over the stomach. No free air. Electronically Signed   By: Rolm Baptise M.D.   On: 08/15/2021 10:33    ECG & Cardiac Imaging    Regular sinus rhythm, right bundle branch block- personally  reviewed.  Assessment & Plan    1.  GI bleeding/acute blood loss anemia: Patient status post lap band in June complicated by esophageal strictures requiring esophageal stenting in October and most recently December 21.  Patient has been on aspirin and Brilinta following non-STEMI and LAD stenting in August 2022.  He developed esophageal bleeding and hematemesis on the evening of December 21 followed by presyncope/syncope and rectal bleeding early on December 22, prompting admission.  Aspirin and Brilinta have been on hold with his last dose taken on the evening of December 21.  Patient has had no chest pain or dyspnea.  He is greater than 3 months out from LAD stenting.  Provided that H&H is stable in the morning, given timing since stenting as well as risk of bleeding, recommend initiation of clopidogrel 75 mg daily.  We will not plan to use Brilinta or aspirin going forward.  If he goes home over the weekend, he will need early follow-up of CBC next week.  2.  Coronary artery disease: Status post non-STEMI in August 2022 with PCI and drug-eluting stent placement to the LAD.  He has residual small vessel diagonal disease but fortunately, has not been having any chest pain or dyspnea in the outpatient setting.  As above, he presented with bright red blood per rectum and hematemesis in the setting of recent esophageal stenting on December 21.  Aspirin Brilinta have been on hold.  Plan to resume clopidogrel 75 mg daily tomorrow.  In the setting of GI bleeding/presyncope/soft blood pressures, home medications such as metoprolol and diltiazem have been on hold.  Provided that blood pressures are stable, would like for him to be back on beta-blocker and statin therapy.  3.  Essential hypertension: Blood pressures have been soft.  Follow.  4.  Hyperlipidemia: Plan to resume statin therapy.  5.  Chronic HFpEF: Euvolemic.  Heart rate and blood pressure stable.  6.  Dilated aortic root: 44 mm by echocardiogram  in August.  We will continue to follow as an outpatient.  Signed, Murray Hodgkins, NP 08/16/2021, 5:32 PM  For questions or updates, please contact   Please consult [email or web address] for contact info under Cardiology/STEMI.   Agree with note by Ignacia Bayley RNP  Pt with known CAD s/p prox LAD stenting by myself in August 2022. We are   asked to see for recommendations regarding anti platelet Rx in the setting of UGIB. S/P esophageal stenting. Currently getting PRBCs. ASA and brilenta have been held already for 48 hours. I would re check CBC tomorrow and if stable cn start Plavix alone.  Lorretta Harp, M.D., [place], [facility], [place] [address] [place], [place]  [id number]  [phone] 08/16/2021 9:40 PM

## 2021-08-16 NOTE — Progress Notes (Signed)
Patient refused bed alarm. Falls education done and patient verbalizes understanding.

## 2021-08-16 NOTE — Progress Notes (Signed)
PROGRESS NOTE  Hector Curry ESP:[id number] DOB: 12-24-1960   PCP: Marguarite Arbour, MD  Patient is from: Home.  Lives with his wife.  Independently ambulates at baseline.  DOA: 08/15/2021 LOS: 0  Chief complaints:  Chief Complaint  Patient presents with   GI Bleeding     Brief Narrative / Interim history: 60 year old M with PMH of gastric ROUX-EN-[id number] in 01/2021, complicated by GS stenosis requiring GJ stent in 03/2021, esophageal stricture s/p dilation and esophageal stent in 05/2021, gastrostomy tube, CAD/stent on 04/15/2021, DM-2, OSA on BiPAP and PSVT/A. fib not on anticoagulation who underwent EGD on 12/21 with new stent placement on 08/14/2021.  During procedure, it was noted that it is difficult to intubate and had some bleeding from laryngeal area, which was felt to be due to Brilinta.  Bleeding subsided and patient was discharged home.  Then, he started spitting up some clots followed by hematemesis and melena that prompted him to come back to [facility], ED.  He was also lightheaded with orthostasis.  In ED, Hgb down to 9 from baseline of 14.  GI and ENT consulted.  He was transferred to [facility] for further evaluation  Hgb dropped further to 7.8.  Transfused 1 unit.  EGD on 12/23 with erythematous posterior fornix, no gross esophageal lesion, pre-existing stent and normal jejunum.  Flexible sigmoidoscopy with nonbleeding nonthrombosed prolapsed external and internal hemorrhoids, and bloody rectum felt to be from upper GI bleed.  GI recommends PPI twice daily, Carafate ACHS, CLD, ENT consultation, antiplatelet discussion and possible colonoscopy if antiplatelet can be held at least for 48 hours.   Subjective: Seen and examined earlier this morning before he went down for EGD and sigmoidoscopy.  No major events overnight of this morning.  Has not had bowel movement since yesterday morning.  No further hematemesis since he came to the hospital.  Felt lightheaded when he sat up on the bed.   Denies chest pain or dyspnea.  Denies abdominal pain.  Objective: Vitals:   08/16/21 0941 08/16/21 1055 08/16/21 1110 08/16/21 1125  BP: (!) 121/57 (!) 94/53 (!) 102/54 (!) 109/57  Pulse: 86 77 69 72  Resp: 12 16 19 18   Temp: 98.7 F (37.1 C) 97.7 F (36.5 C)  97.7 F (36.5 C)  TempSrc: Temporal     SpO2: 95% 95% 95% 98%  Weight: 104.3 kg     Height: 6\' 2"  (1.88 m)       Examination:  GENERAL: No apparent distress.  Nontoxic. HEENT: MMM.  Vision and hearing grossly intact.  NECK: Supple.  No apparent JVD.  RESP:  No IWOB.  Fair aeration bilaterally. CVS:  RRR. Heart sounds normal.  ABD/GI/GU: BS+. Abd soft, NTND.  G-tube in place. MSK/EXT:  Moves extremities. No apparent deformity. No edema.  SKIN: no apparent skin lesion or wound NEURO: Awake, alert and oriented appropriately.  No apparent focal neuro deficit. PSYCH: Calm. Normal affect.   Procedures:  12/23-EGD with erythematous posterior pharnyx, no gross esophageal lesion, pre-existing stent and normal jejunum.   12/23-flexible sigmoidoscopy with nonbleeding nonthrombosed prolapsed external and internal hemorrhoids, and bloody rectum felt to be from upper GI bleed.  Microbiology summarized: COVID-19 and influenza PCR nonreactive.  Assessment & Plan: Symptomatic acute blood loss anemia due to upper GI bleed: Could be acute or acute on chronic blood loss.  Hgb dropped from 14.7 in 9/22 to 9.7 on 12/22.  We have no interval value. Patient is on Brilinta and aspirin for CAD with  recent stent in 03/2021.  Hgb dropped further to 7.8 although he has not had BM or further hematemesis.  There could be some element of hemodilution from IV fluid since admission.  He is a still symptomatic with lightheadedness just sitting on the edge of the bed this morning.  EGD and flex sigmoidoscopy finding as above. Recent Labs    03/27/21 1522 03/28/21 0434 04/14/21 1222 04/15/21 0731 04/15/21 1118 04/16/21 0204 05/03/21 1007  08/15/21 0915 08/15/21 1550 08/16/21 0236  HGB 14.7 14.0 15.4 13.7 14.5 13.1 14.7 9.7* 8.8* 7.8*  -Discontinue IV fluid -Continue clear liquid diet -Anemia panel  -Iron infusion.  Transfuse 1 unit,  -IV Protonix 40 mg twice daily, Carafate ACHS and clear liquid diet. -GI likes to have cardiology input about his DAPT before colonoscopy in case he needs polypectomy-consulted -Continue holding DAPT for now. -Monitor H&H  Pharyngeal injury-seem to have some traumatic intubation during his first EGD on 08/14/2021.  EGD revealed erythematous posterior pharynx -Discussed with [facility], Dr. Lexine Baton, [facility] who recommended outpatient follow-up unless he is actively bleeding or concern for airway compromise.  Also suggested racemic epi as needed for bleed or cough.   CAD/non-STEMI s/p stent in 03/2021-no anginal symptoms. -DAPT on hold given ongoing blood loss-this seems to have been discussed with Dr. Harrington Challenger on admission -Formal consult requested as well.   Controlled DM-2 with hyperglycemia: A1c 6.0%. Recent Labs  Lab 08/14/21 0910 08/15/21 2144 08/16/21 0950 08/16/21 1056  GLUCAP 121* 197* 142* 148*  -Continue current insulin regimen -Continue statin.   Obstructive sleep apnea -Continue home BiPAP  History of PSVT/paroxysmal A. fib-seems to be in sinus rhythm on exam now -IV metoprolol as needed  Body mass index is 29.53 kg/m.         DVT prophylaxis:  SCDs Start: 08/15/21 1551  Code Status: Full code Family Communication: Updated patient's wife at bedside. Level of care: Telemetry Medical Status is: Observation  The patient will require care spanning > 2 midnights and should be moved to inpatient because: Acute symptomatic blood loss anemia due to GI bleed requiring further evaluation      Consultants:  Gastroenterology [facility]- ENT Cardiology   Sch Meds:  Scheduled Meds:  sodium chloride   Intravenous Once   sodium chloride   Intravenous Once   insulin aspart  0-15  Units Subcutaneous Q4H   pantoprazole (PROTONIX) IV  40 mg Intravenous Q12H   sucralfate  1 g Oral TID WC & HS   Continuous Infusions:  sodium chloride 100 mL/hr at 08/16/21 1024   PRN Meds:.metoprolol tartrate, ondansetron **OR** ondansetron (ZOFRAN) IV  Antimicrobials: Anti-infectives (From admission, onward)    None        I have personally reviewed the following labs and images: CBC: Recent Labs  Lab 08/15/21 0915 08/15/21 1550 08/16/21 0236  WBC 16.3* 17.4* 13.2*  HGB 9.7* 8.8* 7.8*  HCT 30.7* 27.6* 24.8*  MCV 88.5 87.6 85.8  PLT 249 218 158   BMP &GFR Recent Labs  Lab 08/15/21 0915 08/16/21 0236  NA 137 137  K 4.7 3.9  CL 101 104  CO2 25 29  GLUCOSE 254* 178*  BUN 25* 20  CREATININE 0.74 0.78  CALCIUM 8.4* 8.4*   Estimated Creatinine Clearance: 128 mL/min (by C-G formula based on SCr of 0.78 mg/dL). Liver & Pancreas: Recent Labs  Lab 08/15/21 0915 08/16/21 0236  AST 23 17  ALT 26 21  ALKPHOS 73 64  BILITOT 0.6 0.5  PROT 5.7*  5.2*  ALBUMIN 3.0* 2.8*   No results for input(s): LIPASE, AMYLASE in the last 168 hours. No results for input(s): AMMONIA in the last 168 hours. Diabetic: Recent Labs    08/15/21 1556  HGBA1C 6.0*   Recent Labs  Lab 08/14/21 0910 08/15/21 2144 08/16/21 0950 08/16/21 1056  GLUCAP 121* 197* 142* 148*   Cardiac Enzymes: No results for input(s): CKTOTAL, CKMB, CKMBINDEX, TROPONINI in the last 168 hours. No results for input(s): PROBNP in the last 8760 hours. Coagulation Profile: Recent Labs  Lab 08/15/21 0915  INR 1.2   Thyroid Function Tests: No results for input(s): TSH, T4TOTAL, FREET4, T3FREE, THYROIDAB in the last 72 hours. Lipid Profile: No results for input(s): CHOL, HDL, LDLCALC, TRIG, CHOLHDL, LDLDIRECT in the last 72 hours. Anemia Panel: Recent Labs    08/16/21 1226  VITAMINB12 765  FOLATE 17.1  FERRITIN 15*  TIBC 339  IRON 22*  RETICCTPCT 2.0   Urine analysis:    Component Value  Date/Time   COLORURINE AMBER (A) 03/06/2021 0905   APPEARANCEUR HAZY (A) 03/06/2021 0905   LABSPEC 1.023 03/06/2021 0905   PHURINE 6.0 03/06/2021 0905   GLUCOSEU NEGATIVE 03/06/2021 0905   HGBUR NEGATIVE 03/06/2021 0905   BILIRUBINUR NEGATIVE 03/06/2021 0905   KETONESUR 80 (A) 03/06/2021 0905   PROTEINUR 100 (A) 03/06/2021 0905   UROBILINOGEN 0.2 11/24/2009 1658   NITRITE NEGATIVE 03/06/2021 0905   LEUKOCYTESUR NEGATIVE 03/06/2021 0905   Sepsis Labs: Invalid input(s): PROCALCITONIN, March ARB  Microbiology: Recent Results (from the past 240 hour(s))  Resp Panel by RT-PCR (Flu A&B, Covid)     Status: None   Collection Time: 08/15/21  3:15 PM  Result Value Ref Range Status   SARS Coronavirus 2 by RT PCR NEGATIVE NEGATIVE Final    Comment: (NOTE) SARS-CoV-2 target nucleic acids are NOT DETECTED.  The SARS-CoV-2 RNA is generally detectable in upper respiratory specimens during the acute phase of infection. The lowest concentration of SARS-CoV-2 viral copies this assay can detect is 138 copies/mL. A negative result does not preclude SARS-Cov-2 infection and should not be used as the sole basis for treatment or other patient management decisions. A negative result may occur with  improper specimen collection/handling, submission of specimen other than nasopharyngeal swab, presence of viral mutation(s) within the areas targeted by this assay, and inadequate number of viral copies(<138 copies/mL). A negative result must be combined with clinical observations, patient history, and epidemiological information. The expected result is Negative.  Fact Sheet for Patients:  EntrepreneurPulse.com.au  Fact Sheet for Healthcare Providers:  IncredibleEmployment.be  This test is no t yet approved or cleared by the [place] FDA and  has been authorized for detection and/or diagnosis of SARS-CoV-2 by FDA under an Emergency Use Authorization (EUA).  This EUA will remain  in effect (meaning this test can be used) for the duration of the COVID-19 declaration under Section 564(b)(1) of the Act, 21 U.S.C.section 360bbb-3(b)(1), unless the authorization is terminated  or revoked sooner.       Influenza A by PCR NEGATIVE NEGATIVE Final   Influenza B by PCR NEGATIVE NEGATIVE Final    Comment: (NOTE) The Xpert Xpress SARS-CoV-2/FLU/RSV plus assay is intended as an aid in the diagnosis of influenza from Nasopharyngeal swab specimens and should not be used as a sole basis for treatment. Nasal washings and aspirates are unacceptable for Xpert Xpress SARS-CoV-2/FLU/RSV testing.  Fact Sheet for Patients: EntrepreneurPulse.com.au  Fact Sheet for Healthcare Providers: IncredibleEmployment.be  This test is not yet  approved or cleared by the [place] and has been authorized for detection and/or diagnosis of SARS-CoV-2 by FDA under an Emergency Use Authorization (EUA). This EUA will remain in effect (meaning this test can be used) for the duration of the COVID-19 declaration under Section 564(b)(1) of the Act, 21 U.S.C. section 360bbb-3(b)(1), unless the authorization is terminated or revoked.  Performed at [facility], [address] [place], [place] [address]     Radiology Studies: No results found.   60 minutes with more than 50% spent in reviewing records, counseling patient/family, coordinating care and discussing patient's care with involved consultants.   Shaneika Rossa T. Adriene Padula [facility]  If 7PM-7AM, please contact night-coverage [email or web address] 08/16/2021, 2:45 PM

## 2021-08-16 NOTE — Anesthesia Procedure Notes (Signed)
Procedure Name: MAC Date/Time: 08/16/2021 10:26 AM Performed by: Inda Coke, CRNA Pre-anesthesia Checklist: Patient identified, Emergency Drugs available, Suction available, Timeout performed and Patient being monitored Patient Re-evaluated:Patient Re-evaluated prior to induction Oxygen Delivery Method: Nasal cannula Induction Type: IV induction Dental Injury: Teeth and Oropharynx as per pre-operative assessment

## 2021-08-16 NOTE — Anesthesia Postprocedure Evaluation (Signed)
Anesthesia Post Note  Patient: Hector Curry  Procedure(s) Performed: ESOPHAGOGASTRODUODENOSCOPY (EGD) [place] SIGMOIDOSCOPY     Patient location during evaluation: Endoscopy Anesthesia Type: MAC Level of consciousness: awake and alert Pain management: pain level controlled Vital Signs Assessment: post-procedure vital signs reviewed and stable Respiratory status: spontaneous breathing, nonlabored ventilation, respiratory function stable and patient connected to nasal cannula oxygen Cardiovascular status: stable and blood pressure returned to baseline Postop Assessment: no apparent nausea or vomiting Anesthetic complications: no   No notable events documented.  Last Vitals:  Vitals:   08/16/21 1110 08/16/21 1125  BP: (!) 102/54 (!) 109/57  Pulse: 69 72  Resp: 19 18  Temp:  36.5 C  SpO2: 95% 98%    Last Pain:  Vitals:   08/16/21 1125  TempSrc:   PainSc: 0-No pain                 Polina Burmaster S

## 2021-08-17 DIAGNOSIS — Z7902 Long term (current) use of antithrombotics/antiplatelets: Secondary | ICD-10-CM

## 2021-08-17 DIAGNOSIS — B3781 Candidal esophagitis: Secondary | ICD-10-CM

## 2021-08-17 DIAGNOSIS — K9189 Other postprocedural complications and disorders of digestive system: Secondary | ICD-10-CM

## 2021-08-17 DIAGNOSIS — K3189 Other diseases of stomach and duodenum: Secondary | ICD-10-CM

## 2021-08-17 LAB — HEMOGLOBIN AND HEMATOCRIT, BLOOD
HCT: 25.5 % — ABNORMAL LOW (ref 39.0–52.0)
HCT: 27.1 % — ABNORMAL LOW (ref 39.0–52.0)
Hemoglobin: 8.2 g/dL — ABNORMAL LOW (ref 13.0–17.0)
Hemoglobin: 8.6 g/dL — ABNORMAL LOW (ref 13.0–17.0)

## 2021-08-17 LAB — TYPE AND SCREEN
ABO/RH(D): B POS
Antibody Screen: NEGATIVE
Unit division: 0

## 2021-08-17 LAB — BPAM RBC
Blood Product Expiration Date: 202301142359
ISSUE DATE / TIME: 202212231437
Unit Type and Rh: 7300

## 2021-08-17 MED ORDER — INSULIN LISPRO (1 UNIT DIAL) 100 UNIT/ML (KWIKPEN): 3.0000 [IU] | PEN_INJECTOR | Freq: Three times a day (TID) | SUBCUTANEOUS | Status: DC | PRN

## 2021-08-17 MED ORDER — SUCRALFATE 1 G PO TABS: 1.0000 g | ORAL_TABLET | Freq: Three times a day (TID) | ORAL | 0 refills | Status: DC

## 2021-08-17 MED ORDER — PANTOPRAZOLE SODIUM 40 MG PO TBEC: 40.0000 mg | DELAYED_RELEASE_TABLET | Freq: Two times a day (BID) | ORAL | 1 refills | Status: DC

## 2021-08-17 MED ORDER — FLUCONAZOLE 200 MG PO TABS: 200.0000 mg | ORAL_TABLET | Freq: Every day | ORAL | 0 refills | Status: DC

## 2021-08-17 MED ORDER — PANTOPRAZOLE SODIUM 40 MG PO TBEC: 40.0000 mg | DELAYED_RELEASE_TABLET | Freq: Two times a day (BID) | ORAL | Status: DC

## 2021-08-17 MED ORDER — FERROUS GLUCONATE 324 (38 FE) MG PO TABS: 324.0000 mg | ORAL_TABLET | Freq: Every day | ORAL | Status: DC

## 2021-08-17 MED ORDER — FLUCONAZOLE 100 MG PO TABS: 200.0000 mg | ORAL_TABLET | Freq: Every day | ORAL | Status: DC

## 2021-08-17 MED ORDER — FERROUS SULFATE 325 (65 FE) MG PO TABS
325.0000 mg | ORAL_TABLET | Freq: Two times a day (BID) | ORAL | 0 refills | Status: DC
  Filled 2021-10-22: qty 180, 90d supply, fill #0
  Filled 2022-01-29: qty 180, 90d supply, fill #1

## 2021-08-17 MED ORDER — CLOPIDOGREL BISULFATE 75 MG PO TABS: 75.0000 mg | ORAL_TABLET | Freq: Every day | ORAL | 1 refills | Status: DC

## 2021-08-17 MED ORDER — FLUCONAZOLE 100 MG PO TABS
400.0000 mg | ORAL_TABLET | Freq: Once | ORAL | Status: AC
  Administered 2021-08-17: 08:00:00 400 mg via ORAL
  Filled 2021-08-17: qty 4

## 2021-08-17 MED ORDER — FLUCONAZOLE 40 MG/ML PO SUSR: 400.0000 mg | Freq: Every day | ORAL | Status: DC

## 2021-08-17 MED ORDER — SODIUM CHLORIDE 0.9 % IV SOLN
250.0000 mg | Freq: Once | INTRAVENOUS | Status: AC
  Administered 2021-08-17: 12:00:00 250 mg via INTRAVENOUS
  Filled 2021-08-17: qty 20

## 2021-08-17 MED ORDER — SUCRALFATE 1 GM/10ML PO SUSP
1.0000 g | Freq: Three times a day (TID) | ORAL | 0 refills | Status: DC
Start: 2021-08-17 — End: 2021-08-17

## 2021-08-17 NOTE — Discharge Summary (Signed)
Physician Discharge Summary  Hector Curry [id number] DOB: 10-14-1960 DOA: 08/15/2021  PCP: [name], MD  Admit date: 08/15/2021 Discharge date: 08/17/2021 Admitted From: Home Disposition: Home Recommendations for Outpatient Follow-up:  Follow ups as below. Please obtain CBC/BMP/Mag in 1 week Please follow up on the following pending results: None Home Health: Not indicated  Equipment/Devices: Not indicated Discharge Condition: Stable CODE STATUS: Full code  Follow-up Information     [name], MD. Schedule an appointment as soon as possible for a visit in 1 week(s).   Specialty: Internal Medicine Contact information: [place] [address] [phone]         Skotnicki, Trinda Pascal A, DO. Schedule an appointment as soon as possible for a visit.   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 fib not on anticoagulation who underwent EGD on 12/21 with new stent placement on 08/14/2021.  During procedure, it was noted that it is difficult to intubate and had some bleeding from laryngeal area, which was felt to be due to [place].  Bleeding subsided and patient was discharged home.  Then, he started spitting up some clots followed by hematemesis and melena that prompted him to come back to [facility], ED.  He was also lightheaded with orthostasis.  In ED, Hgb down to 9 from baseline of 14.  GI and ENT consulted.  He was transferred to [facility] for further evaluation   Hgb dropped further to 7.8.   Transfused 1 unit with appropriate response.  EGD on 12/23 with erythematous posterior pharynx, no gross esophageal lesion, pre-existing GJ stent and normal jejunum.  Flexible sigmoidoscopy with nonbleeding nonthrombosed prolapsed external and internal hemorrhoids, and blood in rectum felt to be from upper GI bleed.  GI recommends PPI twice daily, Carafate ACHS, ENT and cardiology consultation. ENT, Dr. Pearson Grippe recommended outpatient follow-up since patient has no active bleeding from throat, respiratory symptoms or oropharyngeal dysphagia.  Cardiology recommended resuming Plavix and discontinuing aspirin and Brilinta.   On the day of discharge, started on p.o. Plavix per cardiology.  Tolerated soft diet.  Patient has not had further GI bleed.  H&H remained stable.  Anemia symptoms resolved.  He felt well and ready to go home.  He also received IV ferric gluconate 250 mg once.  Patient to follow-up with PCP in 1 week to have CBC rechecked.  Also to start p.o. iron in 1 week once melena resolves.  See individual problem list below for more on hospital course.  Discharge Diagnoses:  Symptomatic acute blood loss anemia due to upper GI bleed: Could be acute or acute on chronic blood loss.  Hgb dropped from 14.7 in 9/22 to 9.7 on 12/22.  We have no interval value. Patient is on Brilinta and aspirin for CAD with recent stent in 03/2021.  EGD and flex sigmoidoscopy finding as above. Hgb dropped further to 7.8 although he has not had BM or further hematemesis.  Transfused 1 unit with appropriate response.  Also received IV ferric gluconate 250 mg x 1. Recent  Labs    04/15/21 0731 04/15/21 1118 04/16/21 0204 05/03/21 1007 08/15/21 0915 08/15/21 1550 08/16/21 0236 08/16/21 2009 08/17/21 0441 08/17/21 0923  HGB 13.7 14.5 13.1 14.7 9.7* 8.8* 7.8* 8.5* 8.2* 8.6*  -Discharged on p.o. Protonix 40 mg twice daily and Carafate ACHS  -Patient to continue soft diet. -Brilinta and aspirin discontinued, and he  was started on Plavix per cardiology. -Check CBC in 1 week -GI to arrange outpatient follow-up.   Pharyngeal injury-seem to have some traumatic intubation during his first EGD on 08/14/2021.  EGD revealed erythematous posterior pharynx. Not symptomatic. -Outpatient follow-up with ENT as needed   CAD/non-STEMI s/p stent in 03/2021-no anginal symptoms. -Evaluated by cardiology who recommended discontinuing Brilinta and aspirin and starting Plavix -Resumed metoprolol. -Continue home statin.   Controlled DM-2 with hyperglycemia: A1c 6.0%. -Continue home insulin and a statin   Obstructive sleep apnea -Continue home BiPAP   History of PSVT/paroxysmal A. fib-seems to be in sinus rhythm on exam now -On metoprolol.   Body mass index is 29.53 kg/m.           Discharge Exam: Vitals:   08/16/21 1745 08/16/21 1957 08/17/21 0413 08/17/21 0849  BP: 109/88 118/61 133/63 115/66  Pulse: 78 80 73 74  Temp: 98.5 F (36.9 C) 98.6 F (37 C) 97.9 F (36.6 C) 97.9 F (36.6 C)  Resp: 18 18 18 18   Height:      Weight:      SpO2:  96% 99% 97%  TempSrc: Oral Oral  Axillary  BMI (Calculated):         GENERAL: No apparent distress.  Nontoxic. HEENT: MMM.  Vision and hearing grossly intact.  NECK: Supple.  No apparent JVD.  RESP: No IWOB.  Fair aeration bilaterally. CVS:  RRR. Heart sounds normal.  ABD/GI/GU: Bowel sounds present. Soft. Non tender.  MSK/EXT:  Moves extremities. No apparent deformity. No edema.  SKIN: no apparent skin lesion or wound NEURO: Awake and alert.  Oriented appropriately.  No apparent focal neuro deficit. PSYCH: Calm. Normal affect.   Discharge Instructions  Discharge Instructions     Call MD for:  difficulty breathing, headache or visual disturbances   Complete by: As directed    Call MD for:  extreme fatigue   Complete by: As directed    Call MD for:  persistant dizziness or light-headedness   Complete by: As directed    Diet - low sodium heart healthy    Complete by: As directed    Soft diet   Diet Carb Modified   Complete by: As directed    Discharge instructions   Complete by: As directed    It has been a pleasure taking care of you!  You were hospitalized due to gastrointestinal bleeding for which you have been treated with blood transfusion.  Your bleeding seems to have subsided.  We have started you on new medications to reduce your risk of bleeding and help with your anemia.  We have also changed your blood thinners.  Please review your new medication list and the directions on your medications before you take them.  Continue soft diet.  Follow-up with your primary care doctor next week to have your blood levels rechecked.  Gastroenterologist will arrange outpatient follow-up.  Follow-up with your cardiologist as previously planned.  Avoid any over-the-counter pain medication except plain Tylenol.   Take care,   Increase activity slowly   Complete by: As directed       Allergies as of 08/17/2021  Reactions   Other Other (See Comments)   Cats        Medication List     STOP taking these medications    aspirin 81 MG chewable tablet   Brilinta 90 MG Tabs tablet Generic drug: ticagrelor   diltiazem 10 mg/ml Soln oral suspension Commonly known as: CARDIZEM   Omeprazole 20 MG Tbdd       TAKE these medications    atorvastatin 40 MG tablet Commonly known as: LIPITOR Take 1 tablet by mouth daily.   clopidogrel 75 MG tablet Commonly known as: PLAVIX Take 1 tablet (75 mg total) by mouth daily. Start taking on: August 18, 2021   feeding supplement (OSMOLITE 1.2 CAL) Liqd Place 1,000 mLs into feeding tube continuous. -Begin with 50 ml Osmolite 1.2 via G-tube every 2 hours (8 feedings total while awake) -Free water flush with 30 ml before and after each bolus As tolerated work up to 100 ml Osmolite 1.2 7 times daily (~every 2 hours). -Continue Prosource TF BID. -3 cartons total of Osmolite 1.2 + Prosource  TF BID provides 935 kcals (66% of needs), 61g protein (77% of needs), 112g CHO and 1005 ml H2O. -Free water of 240 ml TID via G-tube or PO -Will meet rest of needs with other liquids/diet and protein supplements What changed:  how much to take when to take this additional instructions   ferrous sulfate 325 (65 FE) MG tablet Take 1 tablet (325 mg total) by mouth 2 (two) times daily with a meal. Start taking on: August 24, 2021   First - Metoprolol 10 MG/ML Soln Take 5 mls via g-tube 2 times a day **refrigerate**   fluconazole 200 MG tablet Commonly known as: DIFLUCAN Take 1 tablet (200 mg total) by mouth daily. Start taking on: August 18, 2021   fluticasone 50 MCG/ACT nasal spray Commonly known as: FLONASE Place 1 spray into both nostrils daily as needed for allergies or rhinitis.   free water Soln Place 60 mLs into feeding tube every 8 (eight) hours.   FreeStyle Libre 2 Sensor Misc Use as directed every 14 days   insulin lispro 100 UNIT/ML KwikPen Commonly known as: HUMALOG Inject 3-4 Units into the skin 3 (three) times daily as needed (high blood sugar).   Melatonin 5 MG Tbdp 5 mg by Feeding Tube route at bedtime as needed (sleep).   nitroGLYCERIN 0.4 MG SL tablet Commonly known as: NITROSTAT Place 1 tablet (0.4 mg total) under the tongue every 5 (five) minutes as needed for chest pain.   OVER THE COUNTER MEDICATION 30 mLs by Feeding Tube route daily. Bariatric liquid multivitamin   OVER THE COUNTER MEDICATION 30 mLs by Feeding Tube route daily. Bariatric b complex liquid   pantoprazole 40 MG tablet Commonly known as: PROTONIX Take 1 tablet (40 mg total) by mouth 2 (two) times daily before a meal.   potassium chloride 20 MEQ/15ML (10%) Soln Dilute 15 mls of solution (20 mEq total) as directed and place into feeding tube 2 (two) times daily.   PRESCRIPTION MEDICATION at bedtime. Bipap   protein supplement Powd Take 1 Scoop by mouth in the morning and at  bedtime.   SM All Day Allergy Childrens 5 MG/5ML Soln Generic drug: cetirizine HCl Take 10 mLs (10 mg total) by mouth once daily What changed:  how much to take how to take this when to take this Another medication with the same name was removed. Continue taking this medication, and follow the directions you see  here.   Sodium Fluoride 5000 Sensitive 1.1-5 % Gel Generic drug: Sod Fluoride-Potassium Nitrate Place 1 application onto teeth daily.   sucralfate 1 g tablet Commonly known as: Carafate Take 1 tablet (1 g total) by mouth 4 (four) times daily -  with meals and at bedtime.   SYSTANE BALANCE OP Place 1 drop into both eyes daily as needed (dry eyes).   TYLENOL PO 1,000 mg by Feeding Tube route every 6 (six) hours as needed (pain). Tylenol 500mg /7ml        Consultations: Gastroenterology Cardiology ENT  Procedures/Studies: 12/23-EGD with erythematous posterior pharnyx, no gross esophageal lesion, pre-existing stent and normal jejunum.   12/23-flexible sigmoidoscopy with nonbleeding nonthrombosed prolapsed external and internal hemorrhoids, and bloody rectum felt to be from upper GI bleed.   DG Abdomen Acute W/Chest  Result Date: 08/15/2021 CLINICAL DATA:  Post GI procedure.  GI bleed. EXAM: DG ABDOMEN ACUTE WITH 1 VIEW CHEST COMPARISON:  05/27/2021 FINDINGS: Gastrostomy tube projects over the stomach. No free air, organomegaly or suspicious calcification. Prior cholecystectomy. Gas within mildly prominent large and small bowel loops, likely within normal limits or slightly dilated suggesting ileus. No acute bony abnormality. Visualized lungs clear. IMPRESSION: Gas within slightly prominent small bowel and large bowel loops. This could be within normal limits or reflect early ileus. No evidence of bowel obstruction. Gastrostomy projects over the stomach. No free air. Electronically Signed   By: Rolm Baptise M.D.   On: 08/15/2021 10:33       The results of significant  diagnostics from this hospitalization (including imaging, microbiology, ancillary and laboratory) are listed below for reference.     Microbiology: Recent Results (from the past 240 hour(s))  Resp Panel by RT-PCR (Flu A&B, Covid)     Status: None   Collection Time: 08/15/21  3:15 PM  Result Value Ref Range Status   SARS Coronavirus 2 by RT PCR NEGATIVE NEGATIVE Final    Comment: (NOTE) SARS-CoV-2 target nucleic acids are NOT DETECTED.  The SARS-CoV-2 RNA is generally detectable in upper respiratory specimens during the acute phase of infection. The lowest concentration of SARS-CoV-2 viral copies this assay can detect is 138 copies/mL. A negative result does not preclude SARS-Cov-2 infection and should not be used as the sole basis for treatment or other patient management decisions. A negative result may occur with  improper specimen collection/handling, submission of specimen other than nasopharyngeal swab, presence of viral mutation(s) within the areas targeted by this assay, and inadequate number of viral copies(<138 copies/mL). A negative result must be combined with clinical observations, patient history, and epidemiological information. The expected result is Negative.  Fact Sheet for Patients:  EntrepreneurPulse.com.au  Fact Sheet for Healthcare Providers:  IncredibleEmployment.be  This test is no t yet approved or cleared by the [place] FDA and  has been authorized for detection and/or diagnosis of SARS-CoV-2 by FDA under an Emergency Use Authorization (EUA). This EUA will remain  in effect (meaning this test can be used) for the duration of the COVID-19 declaration under Section 564(b)(1) of the Act, 21 U.S.C.section 360bbb-3(b)(1), unless the authorization is terminated  or revoked sooner.       Influenza A by PCR NEGATIVE NEGATIVE Final   Influenza B by PCR NEGATIVE NEGATIVE Final    Comment: (NOTE) The Xpert Xpress  SARS-CoV-2/FLU/RSV plus assay is intended as an aid in the diagnosis of influenza from Nasopharyngeal swab specimens and should not be used as a sole basis for treatment. Nasal washings and  aspirates are unacceptable for Xpert Xpress SARS-CoV-2/FLU/RSV testing.  Fact Sheet for Patients: EntrepreneurPulse.com.au  Fact Sheet for Healthcare Providers: IncredibleEmployment.be  This test is not yet approved or cleared by the [place] FDA and has been authorized for detection and/or diagnosis of SARS-CoV-2 by FDA under an Emergency Use Authorization (EUA). This EUA will remain in effect (meaning this test can be used) for the duration of the COVID-19 declaration under Section 564(b)(1) of the Act, 21 U.S.C. section 360bbb-3(b)(1), unless the authorization is terminated or revoked.  Performed at [facility], [address] [place], [place] [address]      Labs:  CBC: Recent Labs  Lab 08/15/21 0915 08/15/21 1550 08/16/21 0236 08/16/21 2009 08/17/21 0441 08/17/21 0923  WBC 16.3* 17.4* 13.2*  --   --   --   HGB 9.7* 8.8* 7.8* 8.5* 8.2* 8.6*  HCT 30.7* 27.6* 24.8* 26.2* 25.5* 27.1*  MCV 88.5 87.6 85.8  --   --   --   PLT 249 218 158  --   --   --    BMP &GFR Recent Labs  Lab 08/15/21 0915 08/16/21 0236  NA 137 137  K 4.7 3.9  CL 101 104  CO2 25 29  GLUCOSE 254* 178*  BUN 25* 20  CREATININE 0.74 0.78  CALCIUM 8.4* 8.4*   Estimated Creatinine Clearance: 128 mL/min (by C-G formula based on SCr of 0.78 mg/dL). Liver & Pancreas: Recent Labs  Lab 08/15/21 0915 08/16/21 0236  AST 23 17  ALT 26 21  ALKPHOS 73 64  BILITOT 0.6 0.5  PROT 5.7* 5.2*  ALBUMIN 3.0* 2.8*   No results for input(s): LIPASE, AMYLASE in the last 168 hours. No results for input(s): AMMONIA in the last 168 hours. Diabetic: Recent Labs    08/15/21 1556  HGBA1C 6.0*   Recent Labs  Lab 08/14/21 0910 08/15/21 2144 08/16/21 0950 08/16/21 1056  GLUCAP  121* 197* 142* 148*   Cardiac Enzymes: No results for input(s): CKTOTAL, CKMB, CKMBINDEX, TROPONINI in the last 168 hours. No results for input(s): PROBNP in the last 8760 hours. Coagulation Profile: Recent Labs  Lab 08/15/21 0915  INR 1.2   Thyroid Function Tests: No results for input(s): TSH, T4TOTAL, FREET4, T3FREE, THYROIDAB in the last 72 hours. Lipid Profile: No results for input(s): CHOL, HDL, LDLCALC, TRIG, CHOLHDL, LDLDIRECT in the last 72 hours. Anemia Panel: Recent Labs    08/16/21 1226  VITAMINB12 765  FOLATE 17.1  FERRITIN 15*  TIBC 339  IRON 22*  RETICCTPCT 2.0   Urine analysis:    Component Value Date/Time   COLORURINE AMBER (A) 03/06/2021 0905   APPEARANCEUR HAZY (A) 03/06/2021 0905   LABSPEC 1.023 03/06/2021 0905   PHURINE 6.0 03/06/2021 0905   GLUCOSEU NEGATIVE 03/06/2021 0905   HGBUR NEGATIVE 03/06/2021 0905   BILIRUBINUR NEGATIVE 03/06/2021 0905   KETONESUR 80 (A) 03/06/2021 0905   PROTEINUR 100 (A) 03/06/2021 0905   UROBILINOGEN 0.2 11/24/2009 1658   NITRITE NEGATIVE 03/06/2021 0905   LEUKOCYTESUR NEGATIVE 03/06/2021 0905   Sepsis Labs: Invalid input(s): PROCALCITONIN, LACTICIDVEN   Time coordinating discharge: 55 minutes  SIGNED:  Mercy Riding, MD  [facility] 08/17/2021, 9:12 PM

## 2021-08-17 NOTE — Progress Notes (Signed)
Gastroenterology Inpatient Follow-up Note   PATIENT IDENTIFICATION  Hector Curry is a 60 y.o. 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  Patient presented to the hospital with hematemesis and maroon stools and acute blood loss anemia. Hospital Day: 3  SUBJECTIVE  Today, the patient feels well. He continues to not have any bowel movements other than the fluid that came out after flexible sigmoidoscopy yesterday. Hemogram stable today in the eights. Is overall feeling better and not having dizziness. The patient is tolerating his secretions and all of the liquid diet from yesterday and today. Patient denies any significant abdominal pain. His throat pain is improving as well. ENT has not evaluated the patient as of yet. Cardiology evaluated the patient with plan for potential restart of Plavix later today as long as hemogram was stable. Patient and wife hopeful to be able to be home for Christmas if possible but understand risks.   OBJECTIVE  Scheduled Inpatient Medications:   sodium chloride   Intravenous Once   sodium chloride   Intravenous Once   clopidogrel  75 mg Oral Daily   [START ON 08/18/2021] fluconazole  200 mg Oral Daily   insulin aspart  0-15 Units Subcutaneous Q4H   pantoprazole (PROTONIX) IV  40 mg Intravenous Q12H   sucralfate  1 g Oral TID WC & HS   Continuous Inpatient Infusions:  PRN Inpatient Medications: metoprolol tartrate, ondansetron **OR** ondansetron (ZOFRAN) IV   Physical Examination  Temp:  [97.7 F (36.5 C)-98.7 F (37.1 C)] 97.9 F (36.6 C) (12/24 0849) Pulse Rate:  [69-86] 74 (12/24 0849) Resp:  [12-19] 18 (12/24 0849) BP: (94-133)/(48-88) 115/66 (12/24 0849) SpO2:  [95 %-99 %] 97 % (12/24 0849) Weight:  [104.3 kg] 104.3 kg (12/23 0941) Temp (24hrs), Avg:98.2 F (36.8 C), Min:97.7 F (36.5 C), Max:98.7  F (37.1 C)  Weight: 104.3 kg GEN: NAD, appears stated age, doesn't appear chronically ill, wife at bedside PSYCH: Cooperative, without pressured speech EYE: Conjunctivae pale-pink, sclerae anicteric ENT: MMM CV: Nontachycardic RESP: No audible wheezing GI: NABS, protuberant abdomen, G-tube in place, soft, NT/ND, without rebound or guarding MSK/EXT: No lower extremity edema SKIN: No jaundice NEURO:  Alert & Oriented x 3, no focal deficits   Review of Data   Laboratory Studies   Recent Labs  Lab 08/16/21 0236  NA 137  K 3.9  CL 104  CO2 29  BUN 20  CREATININE 0.78  GLUCOSE 178*  CALCIUM 8.4*   Recent Labs  Lab 08/16/21 0236  AST 17  ALT 21  ALKPHOS 64    Recent Labs  Lab 08/15/21 0915 08/15/21 1550 08/16/21 0236 08/16/21 2009 08/17/21 0441  WBC 16.3* 17.4* 13.2*  --   --   HGB 9.7* 8.8* 7.8*   < > 8.2*  HCT 30.7* 27.6* 24.8*   < > 25.5*  PLT 249 218 158  --   --    < > = values in this interval not displayed.   Recent Labs  Lab 08/15/21 0915  INR 1.2    Imaging Studies  No new imaging studies to review  GI Procedures and Studies  EGD - The posterior oropharynx is erythematous. - No gross lesions in esophagus. - Pre-existing AXIOS stent found at anastomosis. As noted above, there is expected inflammation around the area of significant narrowing but this makes sense with the stent expanding. There is a small  area of purple tissue without overt visible vessel present. Moving stent around and washing within it did not cause any bleeding. - Normal examined jejunum.  Flex sig - Preparation of the colon was poor. - Hemorrhoids found on digital rectal exam. - Blood in the rectum, in the recto-sigmoid colon and in the sigmoid colon. - Non-bleeding non-thrombosed prolapsed external and internal hemorrhoids.   ASSESSMENT  Mr. Heyes is a 60 y.o. male with a pmh significant for CAD (on antiplatelet therapy), diabetes, morbid obesity (status post  gastric bypass Roux-en-Y), GJ anastomotic stricture (status post stenting).  Patient presented to the hospital with hematemesis and maroon stools and acute blood loss anemia.  The patient is hemodynamically and clinically stable today.  Although our initial suspicion for gastrointestinal bleeding from his AXIOS stenting was low, after yesterday's EGD and flexible sigmoidoscopy there is no doubt that GI blood loss is more likely than just oropharyngeal but there certainly was a potential component of that initially.  It is interesting, because he did so well after the first AXIOS cold stenting without any evidence of bleeding or anything like that.  With that being said he will be continued on PPI therapy and that will be transition to oral therapy today.  Carafate therapy will stay in place for 1 month (before every meal plus nightly).  With the hemoglobin being stable and no evidence of significant bleeding over the course of 48 hours, I think it is reasonable to consider reinitiation of his antiplatelet therapy later today or tomorrow.  I will defer that to the medicine and cardiology service to decide.  Risk of rebleeding hopefully will be low.  In the small off chance that this was a coincidental diverticular bleed, we will need to monitor him.  If he goes home he and his wife know the signs and symptoms to be on the look out for.  Reasonable if he is going to be reinitiated today to have the medicine started today and monitor him over the course of the day before potential discharge.  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  I will see him back in clinic in early next year and we will plan a repeat EGD in 6 to 8 weeks.  I will need to talk with cardiology that time to see if we can come off Plavix safely or if we will have to maintain him on Plavix for the stent pull.  All patient questions were answered to the best of my ability, and the patient  agrees to the aforementioned plan of action with follow-up as indicated.   PLAN/RECOMMENDATIONS  PP PPI 40 mg twice daily Carafate 1 g before every meal plus nightly x1 month Advance diet to soft today May consider restart of antiplatelet therapy later today or tomorrow as per cardiology/medicine I will set up a follow-up in clinic Pharmacy consult for IV iron infusion while in-house Patient to start oral iron ferrous gluconate or ferrous sulfate daily upon discharge   The GI inpatient service will sign off at this time.  Please page/call with questions or concerns.   Justice Britain, MD [facility] # [phone]    LOS: 1 day  Irving Copas  08/17/2021, 9:37 AM

## 2021-08-17 NOTE — Progress Notes (Signed)
Pt declined blood sugar check this morning, has his own blood sugar monitoring system from home and says he does not want to be stuck

## 2021-08-17 NOTE — Progress Notes (Addendum)
Progress Note  Patient Name: Hector Curry Date of Encounter: 08/17/2021  [facility] Cardiologist: Ida Rogue, MD   Subjective   Denies any chest pain, dyspnea, or further bleeding  Inpatient Medications    Scheduled Meds:  sodium chloride   Intravenous Once   sodium chloride   Intravenous Once   clopidogrel  75 mg Oral Daily   [START ON 08/18/2021] fluconazole  200 mg Oral Daily   insulin aspart  0-15 Units Subcutaneous Q4H   pantoprazole  40 mg Oral BID AC   sucralfate  1 g Oral TID WC & HS   Continuous Infusions:  ferric gluconate (FERRLECIT) IVPB 250 mg (08/17/21 1134)   PRN Meds: metoprolol tartrate, ondansetron **OR** ondansetron (ZOFRAN) IV   Vital Signs    Vitals:   08/16/21 1745 08/16/21 1957 08/17/21 0413 08/17/21 0849  BP: 109/88 118/61 133/63 115/66  Pulse: 78 80 73 74  Resp: 18 18 18 18   Temp: 98.5 F (36.9 C) 98.6 F (37 C) 97.9 F (36.6 C) 97.9 F (36.6 C)  TempSrc: Oral Oral  Axillary  SpO2:  96% 99% 97%  Weight:      Height:        Intake/Output Summary (Last 24 hours) at 08/17/2021 1136 Last data filed at 08/17/2021 0730 Gross per 24 hour  Intake 708 ml  Output 1450 ml  Net -742 ml   Last 3 Weights 08/16/2021 08/15/2021 08/14/2021  Weight (lbs) 230 lb 232 lb 232 lb  Weight (kg) 104.327 kg 105.235 kg 105.235 kg      Telemetry    NSR - Personally Reviewed  ECG     - Personally Reviewed  Physical Exam   GEN: No acute distress.   Neck: No JVD Cardiac: RRR, no murmurs, rubs, or gallops.  Respiratory: Clear to auscultation bilaterally. GI: Soft, nontender, non-distended  MS: No edema; No deformity. Neuro:  Nonfocal  Psych: Normal affect   Labs    High Sensitivity Troponin:  No results for input(s): TROPONINIHS in the last 720 hours.   Chemistry Recent Labs  Lab 08/15/21 0915 08/16/21 0236  NA 137 137  K 4.7 3.9  CL 101 104  CO2 25 29  GLUCOSE 254* 178*  BUN 25* 20  CREATININE 0.74 0.78  CALCIUM  8.4* 8.4*  PROT 5.7* 5.2*  ALBUMIN 3.0* 2.8*  AST 23 17  ALT 26 21  ALKPHOS 73 64  BILITOT 0.6 0.5  GFRNONAA >60 >60  ANIONGAP 11 4*    Lipids No results for input(s): CHOL, TRIG, HDL, LABVLDL, LDLCALC, CHOLHDL in the last 168 hours.  Hematology Recent Labs  Lab 08/15/21 0915 08/15/21 1550 08/16/21 0236 08/16/21 1226 08/16/21 2009 08/17/21 0441 08/17/21 0923  WBC 16.3* 17.4* 13.2*  --   --   --   --   RBC 3.47* 3.15* 2.89* 2.81*  --   --   --   HGB 9.7* 8.8* 7.8*  --  8.5* 8.2* 8.6*  HCT 30.7* 27.6* 24.8*  --  26.2* 25.5* 27.1*  MCV 88.5 87.6 85.8  --   --   --   --   MCH 28.0 27.9 27.0  --   --   --   --   MCHC 31.6 31.9 31.5  --   --   --   --   RDW 14.4 14.6 14.7  --   --   --   --   PLT 249 218 158  --   --   --   --  Thyroid No results for input(s): TSH, FREET4 in the last 168 hours.  BNPNo results for input(s): BNP, PROBNP in the last 168 hours.  DDimer No results for input(s): DDIMER in the last 168 hours.   Radiology    No results found.  Cardiac Studies     Patient Profile     60 y.o. male is with a history of CAD, HFpEF, dilated Ao root, palpitations/PSVT, HTN, HL, obesity s/p lap band complicated by esophageal stricture req stenting/dilation, OSA on BiPAP, and FH of premature CAD, who is being seen today for the evaluation of GIB on brilinta  Assessment & Plan    GI bleeding/acute blood loss anemia: Patient status post lap band in June complicated by esophageal strictures requiring esophageal stenting in October and most recently December 21.  Patient has been on aspirin and Brilinta following non-STEMI and LAD stenting in August 2022.  He developed esophageal bleeding and hematemesis on the evening of December 21 followed by presyncope/syncope and rectal bleeding early on December 22, prompting admission.  Aspirin and Brilinta have been on hold with his last dose taken on the evening of December 21.  Patient has had no chest pain or dyspnea.  He is  greater than 3 months out from LAD stenting.   -Hemoglobin stable today, will start Plavix 75 mg daily.  Plan to continue Plavix monotherapy moving forward    Coronary artery disease: Status post non-STEMI in August 2022 with PCI and drug-eluting stent placement to the LAD.  He has residual small vessel diagonal disease but fortunately, has not been having any chest pain or dyspnea in the outpatient setting.  As above, he presented with bright red blood per rectum and hematemesis in the setting of recent esophageal stenting on December 21.  Aspirin Brilinta have been on hold.  Plan to resume clopidogrel 75 mg daily today.  In the setting of GI bleeding/presyncope/soft blood pressures, home medications such as metoprolol and diltiazem have been on hold.  Provided that blood pressures are stable, would like for him to be back on beta-blocker and statin therapy.  Essential hypertension: Would restart home metoprolol as BP allows  Hyperlipidemia: Plan to resume statin therapy.  Chronic HFpEF: Euvolemic.  Heart rate and blood pressure stable.  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  We will continue to follow as an outpatient.  For questions or updates, please contact [facility] Please consult [email or web address] for contact info under        Signed, Little Ishikawa, MD  08/17/2021, 11:36 AM

## 2021-08-17 NOTE — Plan of Care (Signed)
Ready for discharge

## 2021-08-18 LAB — TYPE AND SCREEN
ABO/RH(D): B POS
Antibody Screen: NEGATIVE
Unit division: 0

## 2021-08-18 LAB — BPAM RBC
Blood Product Expiration Date: 202301192359
Unit Type and Rh: 1700

## 2021-08-19 ENCOUNTER — Encounter (HOSPITAL_COMMUNITY): Payer: Self-pay | Admitting: Gastroenterology

## 2021-08-20 ENCOUNTER — Encounter: Payer: Self-pay | Admitting: Gastroenterology

## 2021-08-20 ENCOUNTER — Telehealth: Payer: Self-pay

## 2021-08-20 ENCOUNTER — Telehealth: Payer: Self-pay | Admitting: Cardiovascular Disease

## 2021-08-20 ENCOUNTER — Other Ambulatory Visit: Payer: Self-pay | Admitting: *Deleted

## 2021-08-20 NOTE — Telephone Encounter (Signed)
Patient c/o Palpitations:  High priority if patient c/o lightheadedness, shortness of breath, or chest pain  How long have you had palpitations/irregular HR/ Afib? Are you having the symptoms now? Couple days - on and off   Are you currently experiencing lightheadedness, SOB or CP? yes  Do you have a history of afib (atrial fibrillation) or irregular heart rhythm? no  Have you checked your BP or HR? (document readings if available): will check ASAP  Are you experiencing any other symptoms? no

## 2021-08-20 NOTE — Patient Outreach (Signed)
[facility]) Care Management  08/20/2021  Hector Curry [birth year] [id number]   Transition of care telephone call  Referral received: 12/27 Initial outreach: 12/27 Surgery/procedure date: No surgery, discharged 12/24 Insurance: [facility] Focus/Choice/Save Plan    Initial unsuccessful telephone call to patient's preferred number in order to complete transition of care assessment; no answer, left HIPAA compliant voicemail message requesting return call.   Objective: Per the electronic medical record, Hector Curry  was hospitalized at [facility] from 12/22-12/24 for symptomatic GI bleed. Comorbidities include: CAD, HTN, Atrial tachycardia, GERD, Diverticulosis, gastric bypass, DM, and HLD.  He was discharged to home on 12/24 without the need for home health services or durable medical equipment per the discharge summary.    Plan: If no return call from patient by the end of business day today, this RNCM will route unsuccessful outreach letter with [facility] Care Management pamphlet and 24 hour Nurse Advice Line Magnet to [facility] Care Management clinical pool to be mailed to patient's home address. This RNCM will attempt another outreach within 4 business days.  Kemper Durie, RN, MSN, CCM [facility] Care Management  [facility] Manager [phone]

## 2021-08-20 NOTE — Telephone Encounter (Signed)
Patient will need EGD with AXIOS stent pull fluoroscopy 45-minute case in 6 weeks.  Thanks.  GM

## 2021-08-20 NOTE — Patient Outreach (Signed)
This patient has discharged from [facility] on 08/17/21 Assigned to [facility], RN for outreach to determine if there are any Case Management needs.

## 2021-08-20 NOTE — Telephone Encounter (Signed)
Incoming triage call from [name], he reports recent hospital; admission with complications from EGD with stent procedure on 12/21 that caused post-procedure bleeding.   Since discharge pt reports having palpitations, unable to give any HR or BP as he has not been recording, denies CP or SOB. Pt reports medication changes as well,   providers have stopped Brillinta (replaced with Plavix) Cardizem ASA Propanolol   Pt still on metoprolol BID, advised to continue to take as order.  Advised pt during spells of palpitations, to record his HR and BP. If HR>110 seek the ER for intervention if HR not able to correct itself, if start to have symptoms associated with palpitations and/or bleeding reoccurs then please seek the ER. [name] verbalized understanding.   Will r/t to Dr. Mariah Milling as Lorain Childes, pt has an appt with Dr. Mariah Milling 1/30 at 4:20 PM.  Otherwise all questions were address and no additional concerns at this time. [name] thankful for the return call and advice. Agreeable to plan, will call back for anything further.

## 2021-08-21 ENCOUNTER — Telehealth: Payer: Self-pay | Admitting: Gastroenterology

## 2021-08-21 ENCOUNTER — Other Ambulatory Visit: Payer: Self-pay

## 2021-08-21 DIAGNOSIS — K9189 Other postprocedural complications and disorders of digestive system: Secondary | ICD-10-CM

## 2021-08-21 NOTE — Telephone Encounter (Signed)
EGD scheduled, pt instructed and medications reviewed.  Patient instructions mailed to home.  Patient to call with any questions or concerns.  The pt will call if he has not heard from [place] regarding plavix.  He will call if he has any questions or concerns.

## 2021-08-21 NOTE — Telephone Encounter (Signed)
Noted  

## 2021-08-21 NOTE — Telephone Encounter (Signed)
Patient called and wanted to let Dr. Meridee Score know that he was on the way right now to get his bloodwork done at his PCP.

## 2021-08-21 NOTE — Telephone Encounter (Signed)
Left message on machine to call back   EGD scheduled on 10/16/21 at [facility] with GM at 1115 am.    Plavix letter sent to PCP.

## 2021-08-22 NOTE — Telephone Encounter (Signed)
Received faxed response from Dr Judithann Sheen that the pt can stop plavix 5 days prior to the procedure.  I have called and made pt aware. He verbalized understanding.  He will call if he has any questions or concerns

## 2021-08-27 ENCOUNTER — Other Ambulatory Visit (HOSPITAL_COMMUNITY): Payer: Self-pay

## 2021-08-27 ENCOUNTER — Other Ambulatory Visit: Payer: Self-pay | Admitting: *Deleted

## 2021-08-27 MED ORDER — AMOXICILLIN-POT CLAVULANATE 875-125 MG PO TABS
ORAL_TABLET | ORAL | 0 refills | Status: DC
  Filled 2021-08-27: qty 20, 10d supply, fill #0

## 2021-08-27 MED ORDER — POTASSIUM CHLORIDE ER 20 MEQ PO TBCR
1.0000 | EXTENDED_RELEASE_TABLET | Freq: Two times a day (BID) | ORAL | 5 refills | Status: DC
  Filled 2021-08-27: qty 60, 30d supply, fill #0
  Filled 2021-09-30: qty 60, 30d supply, fill #1
  Filled 2021-11-04: qty 60, 30d supply, fill #2
  Filled 2022-05-26: qty 60, 30d supply, fill #3

## 2021-08-27 NOTE — Patient Outreach (Signed)
[facility]) Care Management  08/27/2021  KANARI STVIL [birth year] [id number]   Transition of care telephone call   Referral received: 12/27 Initial outreach: 12/27 Surgery/procedure date: No surgery, discharged 12/24 Insurance: [place] Focus/Choice/Save Plan       Subjective: 2nd outreach attempt successful telephone call to patient's preferred number in order to complete transition of care assessment; 2 HIPAA identifiers verified. Explained purpose of call and completed transition of care assessment.   States he is doing well, denies post discharge problems, states bleeding is well managed with prescribed medications, tolerating  diet , denies bowel or bladder problems.  Spouse/children are assisting  with his recovery.    Objective: Per the electronic medical record, Mr. Hlavac  was hospitalized at [facility] from 12/22-12/24 for symptomatic GI bleed. Comorbidities include: CAD, HTN, Atrial tachycardia, GERD, Diverticulosis, gastric bypass, DM, and HLD.  He was discharged to home on 12/24 without the need for home health services or durable medical equipment per the discharge summary.    Assessment:  Patient voices good understanding of all discharge instructions. Will follow up with PCP today.  See transition of care flowsheet for assessment details.   Plan:   No ongoing care management needs identified so will close case to [facility] care management services and route successful outreach letter with [place] Management pamphlet and 24 Hour Nurse Line Magnet to [facility] Management clinical pool to be mailed to patient's home address.   Valente David, RN, MSN, [place] Manager [phone]

## 2021-08-28 ENCOUNTER — Other Ambulatory Visit (HOSPITAL_COMMUNITY): Payer: Self-pay

## 2021-08-30 ENCOUNTER — Other Ambulatory Visit (HOSPITAL_COMMUNITY): Payer: Self-pay

## 2021-08-30 ENCOUNTER — Other Ambulatory Visit: Payer: Self-pay

## 2021-08-30 ENCOUNTER — Telehealth: Payer: Self-pay | Admitting: Cardiovascular Disease

## 2021-08-30 MED ORDER — METOPROLOL TARTRATE 50 MG PO TABS
50.0000 mg | ORAL_TABLET | Freq: Two times a day (BID) | ORAL | 3 refills | Status: DC
  Filled 2021-08-30: qty 180, 90d supply, fill #0
  Filled 2021-11-25: qty 180, 90d supply, fill #1
  Filled 2022-04-10: qty 180, 90d supply, fill #2

## 2021-08-30 NOTE — Telephone Encounter (Signed)
Patient calling requesting pill form of Metoprolol. If we can change to pill form place advise dosage and instructions  Looks like liquid metoprolol was sent in 04/2021: Metoprolol Tartrate (FIRST - METOPROLOL) 10 MG/ML SOLN 900 mL 3 05/20/2021    Sig: Take 5 mls via g-tube 2 times a day **refrigerate**    Pharmacy  [facility] - [place], [place] - [address]

## 2021-08-30 NOTE — Telephone Encounter (Signed)
Pt is taking 50mg  BID of metoprolol tartrate solution. Can change to metoprolol tartrate 50mg  tablet BID.

## 2021-08-30 NOTE — Telephone Encounter (Signed)
°*  STAT* If patient is at the pharmacy, call can be transferred to refill team.   1. Which medications need to be refilled? (please list name of each medication and dose if known) metoprolol requesting to be changed to pill form instead of liquid    2. Which pharmacy/location (including street and city if local pharmacy) is medication to be sent to?[place] out patient pharmacy   3. Do they need a 30 day or 90 day supply? 90

## 2021-08-30 NOTE — Telephone Encounter (Signed)
Medication has been sent in as Pill form for patient.   metoprolol tartrate (LOPRESSOR) 50 MG tablet 180 tablet 3 08/30/2021 11/28/2021   Sig - Route: Take 1 tablet (50 mg total) by mouth 2 (two) times daily. - Oral   Sent to pharmacy as: metoprolol tartrate (LOPRESSOR) 50 MG tablet    Pharmacy  [facility]

## 2021-09-02 ENCOUNTER — Encounter: Payer: Self-pay | Admitting: Gastroenterology

## 2021-09-03 ENCOUNTER — Other Ambulatory Visit: Payer: Self-pay

## 2021-09-03 DIAGNOSIS — R197 Diarrhea, unspecified: Secondary | ICD-10-CM

## 2021-09-03 NOTE — Telephone Encounter (Signed)
He was placed on Augmentin by his PCP which could be causing or exacerbating diarrhea. In addition R/O infection, pantoprazole side effect.  Please have him check with his PCP to see if Augmentin can be stopped.  Send GI stool profile under GMs name.  Decrease pantoprazole 40 mg po qd.  Push po fluids until diarrhea improves.  Bland, lactose free, low fat, no raw fruits, no raw vegetable diet until diarrhea improves.

## 2021-09-03 NOTE — Telephone Encounter (Signed)
Dr Fuller Plan can you please review for Dr Rush Landmark as DOD? Thank you

## 2021-09-04 ENCOUNTER — Other Ambulatory Visit: Payer: No Typology Code available for payment source

## 2021-09-06 ENCOUNTER — Other Ambulatory Visit: Payer: No Typology Code available for payment source

## 2021-09-06 DIAGNOSIS — R197 Diarrhea, unspecified: Secondary | ICD-10-CM

## 2021-09-09 ENCOUNTER — Other Ambulatory Visit (HOSPITAL_COMMUNITY): Payer: Self-pay

## 2021-09-09 ENCOUNTER — Other Ambulatory Visit: Payer: Self-pay

## 2021-09-09 LAB — GI PROFILE, STOOL, PCR
Adenovirus F 40/41: NOT DETECTED
Astrovirus: NOT DETECTED
C difficile toxin A/B: DETECTED — AB
Campylobacter: NOT DETECTED
Cryptosporidium: NOT DETECTED
Cyclospora cayetanensis: NOT DETECTED
Entamoeba histolytica: NOT DETECTED
Enteroaggregative E coli: NOT DETECTED
Enteropathogenic E coli: DETECTED — AB
Enterotoxigenic E coli: NOT DETECTED
Giardia lamblia: NOT DETECTED
Norovirus GI/GII: NOT DETECTED
Plesiomonas shigelloides: NOT DETECTED
Rotavirus A: NOT DETECTED
Salmonella: NOT DETECTED
Sapovirus: NOT DETECTED
Shiga-toxin-producing E coli: NOT DETECTED
Shigella/Enteroinvasive E coli: NOT DETECTED
Vibrio cholerae: NOT DETECTED
Vibrio: NOT DETECTED
Yersinia enterocolitica: NOT DETECTED

## 2021-09-09 MED ORDER — AZITHROMYCIN 500 MG PO TABS
500.0000 mg | ORAL_TABLET | Freq: Every day | ORAL | 0 refills | Status: AC
  Filled 2021-09-09: qty 2, 2d supply, fill #0

## 2021-09-09 MED ORDER — VANCOMYCIN HCL 125 MG PO CAPS
125.0000 mg | ORAL_CAPSULE | Freq: Four times a day (QID) | ORAL | 0 refills | Status: AC
  Filled 2021-09-09: qty 40, 10d supply, fill #0

## 2021-09-12 ENCOUNTER — Telehealth: Payer: Self-pay | Admitting: Cardiovascular Disease

## 2021-09-12 ENCOUNTER — Other Ambulatory Visit (HOSPITAL_COMMUNITY): Payer: Self-pay

## 2021-09-12 NOTE — Telephone Encounter (Signed)
Please advise if okay to refill propanolol 30MG  -- Looks like this medication was stopped at his Most recent hospital visit.

## 2021-09-12 NOTE — Telephone Encounter (Signed)
°*  STAT* If patient is at the pharmacy, call can be transferred to refill team.   1. Which medications need to be refilled? (please list name of each medication and dose if known) propanolol 30 mg po PRN   2. Which pharmacy/location (including street and city if local pharmacy) is medication to be sent to? [facility] outpatient pharmacy   3. Do they need a 30 day or 90 day supply? 90

## 2021-09-16 NOTE — Telephone Encounter (Signed)
Patient would like to know why Dr Mariah Milling cannot rewrite prescription to get medication, even if not on medication list Wanted me to note that he is upset with this service Please call to discuss

## 2021-09-18 MED ORDER — PROPRANOLOL HCL 10 MG PO TABS: ORAL_TABLET | ORAL | 1 refills | Status: DC

## 2021-09-18 NOTE — Addendum Note (Signed)
Addended by: Sherri Rad C on: 09/18/2021 02:28 PM   Modules accepted: Orders

## 2021-09-18 NOTE — Telephone Encounter (Signed)
Minna Merritts, MD  Sent: Wed September 18, 2021  1:48 PM  To: [facility] Triage          Message  We can send in propranolol 10 to 20 mg 3 times daily as needed for atrial tachycardia, 60 pills with a refill  Would try not to overdo medication, blood pressure still running somewhat low, last visit with Dr. Doy Hutching systolic pressure [id number]  Thx  TG

## 2021-09-18 NOTE — Telephone Encounter (Signed)
Attempted to contact the patient with Dr. Windell Hummingbird recommendations for his propranolol refill. No answer- I left a detailed message of Dr. Windell Hummingbird recommendations as stated below (ok per DPR) and advised I am sending his RX to the local pharamcy- [facility] in [place].   I asked that he call back with any further questions/ concerns.

## 2021-09-22 NOTE — Progress Notes (Signed)
Patient ID: Hector Curry, male   DOB: Oct 18, 1960, 61 y.o.   MRN: [id number] Cardiology Office Note  Date:  09/23/2021   ID:  Hector Curry, DOB [birth year], MRN [id number]  PCP:  [name], MD   Chief Complaint  Patient presents with   [facility] follow up.     Patient c/o palpitations. Medications reviewed by the patient verbally.     HPI:  Mr. Obarr is a pleasant 61 year old male with history of  obesity, LAP-BAND,  obstructive sleep apnea who wears BIPAP,  15 years of smoking who stopped several years ago,  strong family history of coronary artery disease with father who passed away from MI in his late 21s,  Previous episode of chest pain, cardiac catheterization 04/2015 showing no significant coronary disease who presents  For follow-up Of his atrial tachycardia  LOV with myself August 2022  Discussed recent events, Initial stent placed with removal November 2022, had recurrence of his dysphagia and vomiting  Second procedure, stent 20 mm placed Post procedure Spitting up Blood, GI bleed Passed out at home EMS called by wife, taken to the hospital  Stabilized, PRBC, iron transfusion  at [facility] Diarrhea, ecoli treated  In follow-up today reports feeling better Reports weight is stabilized, still has feeding tube  Takes metoprolol the morning, second dose at noon As appreciated tachycardia overnight, requesting propranolol  Scheduled for upcoming procedure, stent removal 10/16/2020  EKG personally reviewed by myself on todays visit Normal sinus rhythm rate 64 bpm right bundle branch block  past medical history reviewed s/p lap band transitioned to gastric bypass 02/18/2021 with subsequent development of GJ anastomosis stricture and gastrostomy tube placement 0000000  Complication post surgery, stricture  Cath 04/15/2021   Prox LAD lesion is 80% stenosed.   1st Diag lesion is 90% stenosed.   A drug-eluting stent was successfully  placed.   Post intervention, there is a 0% residual stenosis.  Scheduled to see Dr. Justice Britain, [facility] Consultation for consideration of noninvasive intervention to his outflow obstruction   In early September 2016, reported having tachycardia He went to the emergency room, reported having EKG at that time documenting tachycardia. None is available in the computer  30 day monitor was ordered showing no significant arrhythmia He does report that he had a episode of tachycardia but he did not hit the button He did show APCs  Prior Holter monitor showed short runs of atrial tachycardia Many of his symptoms are at nighttime. He does have sleep apnea, wears CPAP, scheduled for repeat sleep study. Last was 15 years ago  Prior episodes of chest pain. Symptoms typically presenting at nighttime Symptoms are rare but will wake him up from sleep.   Not been exercising secondary to chronic joint issues  PMH:   has a past medical history of Allergic rhinitis, Atrial tachycardia ([place]), CAD (coronary artery disease), Diabetes mellitus without complication ([place]), Diastolic dysfunction, Dilated aortic root ([place]), Diverticulosis (10/03/2014), Dysrhythmia, Esophageal stricture, Family history of premature CAD, GERD (gastroesophageal reflux disease), GIB (gastrointestinal bleeding), Hemorrhoids, History of kidney stones, History of tobacco abuse, Hyperlipidemia, Hyperplastic colon polyp (10/03/2014), Hypertension, Inflammatory arthritis, Morbid obesity ([place]), OSA (obstructive sleep apnea), Osteoarthritis, and PSVT (paroxysmal supraventricular tachycardia) ([place]).  PSH:    Past Surgical History:  Procedure Laterality Date   BALLOON DILATION N/A 08/14/2021   Procedure: BALLOON DILATION;  Surgeon: Mansouraty, Telford Nab., MD;  Location: [facility];  Service: Gastroenterology;  Laterality: N/A;   BARIATRIC SURGERY  lap band    BILIARY STENT PLACEMENT N/A 08/14/2021   Procedure: AXIOS STENT PLACEMENT;   Surgeon: Irving Copas., MD;  Location: [facility];  Service: Gastroenterology;  Laterality: N/A;   BIOPSY  07/08/2021   Procedure: BIOPSY;  Surgeon: Rush Landmark Telford Nab., MD;  Location: [facility];  Service: Gastroenterology;;   BIOPSY  08/14/2021   Procedure: BIOPSY;  Surgeon: Irving Copas., MD;  Location: [facility];  Service: Gastroenterology;;   CARDIAC CATHETERIZATION N/A 05/08/2015   Procedure: Left Heart Cath and Coronary Angiography;  Surgeon: Jettie Booze, MD;  Location: [facility];  Service: Cardiovascular;  Laterality: N/A;   CARPAL TUNNEL RELEASE Left    CHOLECYSTECTOMY     COLONOSCOPY  06/27/2005   COLONOSCOPY  10/03/2014   CORONARY STENT INTERVENTION N/A 04/15/2021   Procedure: CORONARY STENT INTERVENTION;  Surgeon: Lorretta Harp, MD;  Location: [facility] [facility];  Service: Cardiovascular;  Laterality: N/A;   ESOPHAGEAL DILATION  05/27/2021   Procedure: ESOPHAGEAL DILATION;  Surgeon: Rush Landmark Telford Nab., MD;  Location: [facility];  Service: Gastroenterology;;   ESOPHAGEAL STENT PLACEMENT N/A 05/27/2021   Procedure: ESOPHAGEAL STENT PLACEMENT;  Surgeon: Irving Copas., MD;  Location: [facility];  Service: Gastroenterology;  Laterality: N/A;   ESOPHAGOGASTRODUODENOSCOPY  06/27/2005   ESOPHAGOGASTRODUODENOSCOPY N/A 03/19/2021   Procedure: ESOPHAGOGASTRODUODENOSCOPY (EGD);  Surgeon: Kieth Brightly, Arta Bruce, MD;  Location: [facility];  Service: General;  Laterality: N/A;   ESOPHAGOGASTRODUODENOSCOPY N/A 08/16/2021   Procedure: ESOPHAGOGASTRODUODENOSCOPY (EGD);  Surgeon: Irving Copas., MD;  Location: [facility];  Service: Gastroenterology;  Laterality: N/A;   ESOPHAGOGASTRODUODENOSCOPY (EGD) WITH PROPOFOL N/A 05/27/2021   Procedure: ESOPHAGOGASTRODUODENOSCOPY (EGD) WITH PROPOFOL;  Surgeon: Rush Landmark Telford Nab., MD;  Location: [facility];  Service: Gastroenterology;  Laterality: N/A;    ESOPHAGOGASTRODUODENOSCOPY (EGD) WITH PROPOFOL N/A 07/08/2021   Procedure: ESOPHAGOGASTRODUODENOSCOPY (EGD) WITH PROPOFOL;  Surgeon: Rush Landmark Telford Nab., MD;  Location: [facility];  Service: Gastroenterology;  Laterality: N/A;  fluoro   ESOPHAGOGASTRODUODENOSCOPY (EGD) WITH PROPOFOL N/A 08/14/2021   Procedure: ESOPHAGOGASTRODUODENOSCOPY (EGD) WITH PROPOFOL;  Surgeon: Rush Landmark Telford Nab., MD;  Location: [facility];  Service: Gastroenterology;  Laterality: N/A;  fluoro Axios stent (20 mm)   FLEXIBLE SIGMOIDOSCOPY N/A 08/16/2021   Procedure: FLEXIBLE SIGMOIDOSCOPY;  Surgeon: Rush Landmark Telford Nab., MD;  Location: [facility];  Service: Gastroenterology;  Laterality: N/A;   GASTRIC ROUX-EN-Y N/A 02/18/2021   Procedure: LAPAROSCOPIC ROUX-EN-Y GASTRIC BYPASS WITH UPPER ENDOSCOPY,;  Surgeon: Kieth Brightly Arta Bruce, MD;  Location: [facility];  Service: General;  Laterality: N/A;   HEMORRHOID SURGERY  08/25/1997   IR [place] TUBE CHANGE  03/27/2021   LAPAROSCOPIC INSERTION GASTROSTOMY TUBE N/A 03/20/2021   Procedure: LAPAROSCOPIC INSERTION GASTROSTOMY TUBE;  Surgeon: Mickeal Skinner, MD;  Location: [facility];  Service: General;  Laterality: N/A;   LEFT HEART CATH AND CORONARY ANGIOGRAPHY N/A 04/15/2021   Procedure: LEFT HEART CATH AND CORONARY ANGIOGRAPHY;  Surgeon: Lorretta Harp, MD;  Location: [facility];  Service: Cardiovascular;  Laterality: N/A;   MOUTH SURGERY     removed area which was benign   STENT REMOVAL  07/08/2021   Procedure: STENT REMOVAL;  Surgeon: Irving Copas., MD;  Location: [facility];  Service: Gastroenterology;;   TONSILLECTOMY     UPPER GI ENDOSCOPY N/A 03/20/2021   Procedure: UPPER GI ENDOSCOPY;  Surgeon: Kieth Brightly Arta Bruce, MD;  Location: [facility];  Service: General;  Laterality: N/A;    Current Outpatient Medications  Medication Sig Dispense Refill  Acetaminophen (TYLENOL PO) 1,000 mg by Feeding Tube route every 6 (six) hours as needed (pain).  Tylenol 500mg /31ml     atorvastatin (LIPITOR) 40 MG tablet Take 1 tablet by mouth daily. 90 tablet 0   cetirizine HCl (ZYRTEC) 1 MG/ML solution Take 10 mLs (10 mg total) by mouth once daily (Patient taking differently: Take 5 mg by mouth daily.) 300 mL 11   clopidogrel (PLAVIX) 75 MG tablet Take 1 tablet (75 mg total) by mouth daily. 90 tablet 1   ferrous sulfate 325 (65 FE) MG tablet Take 1 tablet (325 mg total) by mouth 2 (two) times daily with a meal. 180 tablet 3   fluticasone (FLONASE) 50 MCG/ACT nasal spray Place 1 spray into both nostrils daily as needed for allergies or rhinitis.     insulin lispro (HUMALOG) 100 UNIT/ML KwikPen Inject 3-4 Units into the skin 3 (three) times daily as needed (high blood sugar).     Melatonin 5 MG TBDP 5 mg by Feeding Tube route at bedtime as needed (sleep).     metoprolol tartrate (LOPRESSOR) 50 MG tablet Take 1 tablet by mouth 2  times daily. 180 tablet 3   nitroGLYCERIN (NITROSTAT) 0.4 MG SL tablet Place 1 tablet (0.4 mg total) under the tongue every 5 (five) minutes as needed for chest pain. 25 tablet 3   pantoprazole (PROTONIX) 40 MG tablet Take 1 tablet (40 mg total) by mouth 2 (two) times daily before a meal. 180 tablet 1   Potassium Chloride ER 20 MEQ TBCR Take 1 tablet by mouth 2 times daily 60 tablet 5   PRESCRIPTION MEDICATION at bedtime. Bipap     propranolol (INDERAL) 10 MG tablet Take 1-2 tablets (10 mg- 20 mg) by mouth three times daily as needed for tachycardia. 60 tablet 1   Propylene Glycol (SYSTANE BALANCE OP) Place 1 drop into both eyes daily as needed (dry eyes).     protein supplement (RESOURCE BENEPROTEIN) POWD Take 1 Scoop by mouth in the morning and at bedtime.     SODIUM FLUORIDE 5000 SENSITIVE 1.1-5 % GEL Place 1 application onto teeth daily.     sucralfate (CARAFATE) 1 g tablet Take 1 tablet (1 g total) by mouth 4 (four) times daily -  with meals and at bedtime. 120 tablet 0   amoxicillin-clavulanate (AUGMENTIN) 875-125 MG tablet  Take 1 tablet by mouth every 12 hours for 10 days (Patient not taking: Reported on 09/23/2021) 20 tablet 0   Continuous Blood Gluc Sensor (FREESTYLE LIBRE 2 SENSOR) MISC Use as directed every 14 days (Patient not taking: Reported on 09/23/2021) 10 each 1   fluconazole (DIFLUCAN) 200 MG tablet Take 1 tablet (200 mg total) by mouth daily. (Patient not taking: Reported on 09/23/2021) 13 tablet 0   Nutritional Supplements (FEEDING SUPPLEMENT, OSMOLITE 1.2 CAL,) LIQD Place 1,000 mLs into feeding tube continuous. -Begin with 50 ml Osmolite 1.2 via G-tube every 2 hours (8 feedings total while awake) -Free water flush with 30 ml before and after each bolus As tolerated work up to 100 ml Osmolite 1.2 7 times daily (~every 2 hours). -Continue Prosource TF BID. -3 cartons total of Osmolite 1.2 + Prosource TF BID provides 935 kcals (66% of needs), 61g protein (77% of needs), 112g CHO and 1005 ml H2O. -Free water of 240 ml TID via G-tube or PO -Will meet rest of needs with other liquids/diet and protein supplements (Patient not taking: Reported on 09/23/2021) 1000 mL 20   OVER THE COUNTER MEDICATION 30  mLs by Feeding Tube route daily. Bariatric liquid multivitamin (Patient not taking: Reported on 09/23/2021)     OVER THE COUNTER MEDICATION 30 mLs by Feeding Tube route daily. Bariatric b complex liquid (Patient not taking: Reported on 09/23/2021)     potassium chloride 20 MEQ/15ML (10%) SOLN Dilute 15 mls of solution (20 mEq total) as directed and place into feeding tube 2 (two) times daily. (Patient not taking: Reported on 09/23/2021) 473 mL 2   Water For Irrigation, Sterile (FREE WATER) SOLN Place 60 mLs into feeding tube every 8 (eight) hours. (Patient not taking: Reported on 09/23/2021)     No current facility-administered medications for this visit.     Allergies:   Other   Social History:  The patient  reports that he quit smoking about 32 years ago. His smoking use included cigarettes. He has a 15.00 pack-year  smoking history. He has never used smokeless tobacco. He reports that he does not drink alcohol and does not use drugs.   Family History:   family history includes CAD in his father; Diabetes in his mother and another family member; Heart attack (age of onset: 34) in his father; Heart disease in an other family member; Hypertension in his mother, sister, and another family member.    Review of Systems: Review of Systems  Constitutional: Negative.   Respiratory: Negative.    Cardiovascular: Negative.   Gastrointestinal: Negative.   Musculoskeletal: Negative.   Neurological: Negative.   Psychiatric/Behavioral: Negative.    All other systems reviewed and are negative.   PHYSICAL EXAM: VS:  BP 120/70 (BP Location: Left Arm, Patient Position: Sitting, Cuff Size: Normal)    Pulse 64    Ht 6\' 2"  (1.88 m)    Wt 233 lb 2 oz (105.7 kg)    SpO2 98%    BMI 29.93 kg/m  , BMI Body mass index is 29.93 kg/m. Constitutional:  oriented to person, place, and time. No distress.  HENT:  Head: Grossly normal Eyes:  no discharge. No scleral icterus.  Neck: No JVD, no carotid bruits  Cardiovascular: Regular rate and rhythm, no murmurs appreciated Pulmonary/Chest: Clear to auscultation bilaterally, no wheezes or rails Abdominal: Soft.  no distension.  no tenderness.  Musculoskeletal: Normal range of motion Neurological:  normal muscle tone. Coordination normal. No atrophy Skin: Skin warm and dry Psychiatric: normal affect, pleasant   Recent Labs: 04/14/2021: Magnesium 1.9 08/16/2021: ALT 21; BUN 20; Creatinine, Ser 0.78; Platelets 152; Potassium 3.9; Sodium 137 08/17/2021: Hemoglobin 8.6    Lipid Panel Lab Results  Component Value Date   CHOL 141 04/15/2021   HDL 26 (L) 04/15/2021   LDLCALC 93 04/15/2021   TRIG 109 04/15/2021      Wt Readings from Last 3 Encounters:  09/23/21 233 lb 2 oz (105.7 kg)  08/16/21 230 lb (104.3 kg)  08/14/21 232 lb (105.2 kg)      ASSESSMENT AND  PLAN:  Atrial tachycardia (HCC) -  Having breakthrough tachycardia at nighttime Recommended he take metoprolol the morning and at dinnertime May need to increase dosing from 50 up to 75 twice daily Prescription also provided for propranolol 10 to 20 mg as needed at night  Coronary artery disease,  stent placement to his LAD August 2022 On Plavix  Has follow-up procedure with Dr. Justice Britain, [facility]  Would suggest that if he needs to hold Plavix 5 days before stent removal, he consider taking aspirin 81 mg daily instead.  He will discuss this with doctors performing  procedure  controlled type 2 diabetes mellitus without complication, without long-term current use of insulin (HCC) A1c much improved 5.6 Has had significant weight loss over the past year  Hyperlipidemia Dramatic improvement in numbers, total cholesterol 73 He can probably cut his Lipitor down to 10 daily  Elevated LFTs Dramatic improvement with weight loss   Total encounter time more than 25 minutes  Greater than 50% was spent in counseling and coordination of care with the patient   No orders of the defined types were placed in this encounter.   Total encounter time more than 15 minutes  Greater than 50% was spent in counseling and coordination of care with the patient   Signed, Esmond Plants, M.D., Ph.D. 09/23/2021  [place], [place]

## 2021-09-23 ENCOUNTER — Encounter: Payer: Self-pay | Admitting: Cardiovascular Disease

## 2021-09-23 ENCOUNTER — Ambulatory Visit (INDEPENDENT_AMBULATORY_CARE_PROVIDER_SITE_OTHER): Payer: No Typology Code available for payment source | Admitting: Cardiovascular Disease

## 2021-09-23 ENCOUNTER — Other Ambulatory Visit: Payer: Self-pay

## 2021-09-23 ENCOUNTER — Other Ambulatory Visit (HOSPITAL_COMMUNITY): Payer: Self-pay

## 2021-09-23 VITALS — BP 120/70 | HR 64 | Ht 74.0 in | Wt 233.1 lb

## 2021-09-23 DIAGNOSIS — E782 Mixed hyperlipidemia: Secondary | ICD-10-CM | POA: Diagnosis not present

## 2021-09-23 DIAGNOSIS — I5032 Chronic diastolic (congestive) heart failure: Secondary | ICD-10-CM

## 2021-09-23 DIAGNOSIS — Z6841 Body Mass Index (BMI) 40.0 and over, adult: Secondary | ICD-10-CM

## 2021-09-23 DIAGNOSIS — I471 Supraventricular tachycardia: Secondary | ICD-10-CM | POA: Diagnosis not present

## 2021-09-23 DIAGNOSIS — I77810 Thoracic aortic ectasia: Secondary | ICD-10-CM

## 2021-09-23 DIAGNOSIS — I25118 Atherosclerotic heart disease of native coronary artery with other forms of angina pectoris: Secondary | ICD-10-CM

## 2021-09-23 MED ORDER — PROPRANOLOL HCL 10 MG PO TABS
ORAL_TABLET | ORAL | 3 refills | Status: DC
  Filled 2021-09-23: qty 60, 10d supply, fill #0
  Filled 2021-11-12: qty 180, 30d supply, fill #1

## 2021-09-23 NOTE — Patient Instructions (Signed)
Medication Instructions:  No changes  If you need a refill on your cardiac medications before your next appointment, please call your pharmacy.   Lab work: No new labs needed  Testing/Procedures: No new testing needed  Follow-Up: At [facility], you and your health needs are our priority.  As part of our continuing mission to provide you with exceptional heart care, we have created designated Provider Care Teams.  These Care Teams include your primary Cardiologist (physician) and Advanced Practice Providers (APPs -  Physician Assistants and Nurse Practitioners) who all work together to provide you with the care you need, when you need it.  You will need a follow up appointment in 12 months  Providers on your designated Care Team:   Christopher Berge, NP Ryan Dunn, PA-C Cadence Furth, PA-C  COVID-19 Vaccine Information can be found at: [email or web address] For questions related to vaccine distribution or appointments, please email [email or web address] or call [phone].   

## 2021-09-26 ENCOUNTER — Other Ambulatory Visit (HOSPITAL_COMMUNITY): Payer: Self-pay

## 2021-09-27 ENCOUNTER — Other Ambulatory Visit (HOSPITAL_COMMUNITY): Payer: Self-pay

## 2021-09-30 ENCOUNTER — Other Ambulatory Visit (HOSPITAL_COMMUNITY): Payer: Self-pay

## 2021-10-01 ENCOUNTER — Telehealth: Payer: Self-pay | Admitting: Cardiovascular Disease

## 2021-10-01 NOTE — Telephone Encounter (Signed)
10/01/2021  Patient came into [facility] office. Requested to start having future appointments at the [place] location due working in the area and commute. Patient is okay with seeing any of the providers in the [place] office.

## 2021-10-02 ENCOUNTER — Other Ambulatory Visit (HOSPITAL_COMMUNITY): Payer: Self-pay

## 2021-10-03 ENCOUNTER — Telehealth: Payer: Self-pay | Admitting: Gastroenterology

## 2021-10-03 NOTE — Telephone Encounter (Signed)
Pt is aware that he can take the baby aspirin if that is what the cardiologist is recommending.

## 2021-10-03 NOTE — Telephone Encounter (Signed)
Patient called to state he had seen his cardiologist, Dr. Julien Nordmann, about holding his Plavix for his upcoming procedure.  The doctor wanted to know if patient could possibly take a baby aspirin everyday or at least every other day during those 5 days he is to hold the Plavix.  Please call patient and advise.  Thank you.

## 2021-10-08 ENCOUNTER — Encounter (HOSPITAL_COMMUNITY): Payer: Self-pay | Admitting: Gastroenterology

## 2021-10-08 ENCOUNTER — Other Ambulatory Visit: Payer: Self-pay | Admitting: Cardiovascular Disease

## 2021-10-08 ENCOUNTER — Other Ambulatory Visit: Payer: Self-pay

## 2021-10-08 ENCOUNTER — Other Ambulatory Visit (HOSPITAL_COMMUNITY): Payer: Self-pay

## 2021-10-08 MED ORDER — ATORVASTATIN CALCIUM 10 MG PO TABS
10.0000 mg | ORAL_TABLET | Freq: Every day | ORAL | 2 refills | Status: DC
  Filled 2021-10-08: qty 90, 90d supply, fill #0
  Filled 2022-01-14: qty 90, 90d supply, fill #1
  Filled 2022-05-12: qty 90, 90d supply, fill #2

## 2021-10-08 NOTE — Telephone Encounter (Signed)
Please advise on atorvastatin dosage.  Per Dr. Windell Hummingbird 09/23/2021 office note: Hyperlipidemia Dramatic improvement in numbers, total cholesterol 73 He can probably cut his Lipitor down to 10 daily  Thank you!

## 2021-10-08 NOTE — Progress Notes (Signed)
Attempted to obtain medical history via telephone, unable to reach at this time. I left a voicemail to return pre surgical testing department's phone call.  

## 2021-10-16 ENCOUNTER — Ambulatory Visit (HOSPITAL_COMMUNITY)
Admission: RE | Admit: 2021-10-16 | Discharge: 2021-10-16 | Disposition: A | Discharge status: Home / Self Care | Payer: PRIVATE HEALTH INSURANCE | Attending: Gastroenterology | Admitting: Gastroenterology

## 2021-10-16 ENCOUNTER — Other Ambulatory Visit: Payer: Self-pay

## 2021-10-16 ENCOUNTER — Ambulatory Visit (HOSPITAL_BASED_OUTPATIENT_CLINIC_OR_DEPARTMENT_OTHER): Payer: PRIVATE HEALTH INSURANCE | Admitting: Registered Nurse

## 2021-10-16 ENCOUNTER — Ambulatory Visit (HOSPITAL_COMMUNITY): Payer: PRIVATE HEALTH INSURANCE | Admitting: Registered Nurse

## 2021-10-16 ENCOUNTER — Encounter (HOSPITAL_COMMUNITY): Payer: Self-pay | Admitting: Gastroenterology

## 2021-10-16 ENCOUNTER — Encounter (HOSPITAL_COMMUNITY)
Admission: RE | Disposition: A | Discharge status: Home / Self Care | Payer: Self-pay | Source: Home / Self Care | Attending: Gastroenterology

## 2021-10-16 ENCOUNTER — Other Ambulatory Visit (HOSPITAL_COMMUNITY): Payer: Self-pay

## 2021-10-16 DIAGNOSIS — Z794 Long term (current) use of insulin: Secondary | ICD-10-CM | POA: Diagnosis not present

## 2021-10-16 DIAGNOSIS — K2970 Gastritis, unspecified, without bleeding: Secondary | ICD-10-CM | POA: Insufficient documentation

## 2021-10-16 DIAGNOSIS — E119 Type 2 diabetes mellitus without complications: Secondary | ICD-10-CM | POA: Diagnosis not present

## 2021-10-16 DIAGNOSIS — Z978 Presence of other specified devices: Secondary | ICD-10-CM | POA: Diagnosis not present

## 2021-10-16 DIAGNOSIS — K9589 Other complications of other bariatric procedure: Secondary | ICD-10-CM

## 2021-10-16 DIAGNOSIS — Z980 Intestinal bypass and anastomosis status: Secondary | ICD-10-CM | POA: Diagnosis not present

## 2021-10-16 DIAGNOSIS — Z4659 Encounter for fitting and adjustment of other gastrointestinal appliance and device: Secondary | ICD-10-CM

## 2021-10-16 DIAGNOSIS — Z7902 Long term (current) use of antithrombotics/antiplatelets: Secondary | ICD-10-CM | POA: Diagnosis not present

## 2021-10-16 DIAGNOSIS — I2510 Atherosclerotic heart disease of native coronary artery without angina pectoris: Secondary | ICD-10-CM | POA: Diagnosis not present

## 2021-10-16 DIAGNOSIS — K219 Gastro-esophageal reflux disease without esophagitis: Secondary | ICD-10-CM | POA: Insufficient documentation

## 2021-10-16 DIAGNOSIS — K922 Gastrointestinal hemorrhage, unspecified: Secondary | ICD-10-CM | POA: Diagnosis not present

## 2021-10-16 DIAGNOSIS — I10 Essential (primary) hypertension: Secondary | ICD-10-CM | POA: Insufficient documentation

## 2021-10-16 DIAGNOSIS — K3189 Other diseases of stomach and duodenum: Secondary | ICD-10-CM | POA: Insufficient documentation

## 2021-10-16 DIAGNOSIS — Z87891 Personal history of nicotine dependence: Secondary | ICD-10-CM | POA: Insufficient documentation

## 2021-10-16 DIAGNOSIS — R1310 Dysphagia, unspecified: Secondary | ICD-10-CM | POA: Insufficient documentation

## 2021-10-16 HISTORY — PX: ESOPHAGOGASTRODUODENOSCOPY (EGD) WITH PROPOFOL: SHX5813

## 2021-10-16 HISTORY — PX: STENT REMOVAL: SHX6421

## 2021-10-16 LAB — GLUCOSE, CAPILLARY: Glucose-Capillary: 115 mg/dL — ABNORMAL HIGH (ref 70–99)

## 2021-10-16 SURGERY — ESOPHAGOGASTRODUODENOSCOPY (EGD) WITH PROPOFOL
Anesthesia: Monitor Anesthesia Care

## 2021-10-16 MED ORDER — SUCRALFATE 1 G PO TABS
1.0000 g | ORAL_TABLET | Freq: Three times a day (TID) | ORAL | 2 refills | Status: DC
  Filled 2021-10-16: qty 420, 11d supply, fill #0
  Filled 2021-10-16: qty 42, 11d supply, fill #0

## 2021-10-16 MED ORDER — PANTOPRAZOLE SODIUM 40 MG PO TBEC
40.0000 mg | DELAYED_RELEASE_TABLET | Freq: Two times a day (BID) | ORAL | 12 refills | Status: DC
  Filled 2021-10-16: qty 60, 30d supply, fill #0
  Filled 2021-12-09: qty 60, 30d supply, fill #1
  Filled 2022-04-01: qty 60, 30d supply, fill #2
  Filled 2022-06-16: qty 60, 30d supply, fill #3
  Filled 2022-09-22: qty 180, 90d supply, fill #4

## 2021-10-16 MED ORDER — PROPOFOL 1000 MG/100ML IV EMUL
INTRAVENOUS | Status: AC
  Filled 2021-10-16: qty 100

## 2021-10-16 MED ORDER — CLOPIDOGREL BISULFATE 75 MG PO TABS
75.0000 mg | ORAL_TABLET | Freq: Every day | ORAL | 1 refills | Status: DC
  Filled 2021-10-16: qty 90, 90d supply, fill #0

## 2021-10-16 MED ORDER — PROPOFOL 500 MG/50ML IV EMUL
INTRAVENOUS | Status: DC | PRN
  Administered 2021-10-16: 150 ug/kg/min via INTRAVENOUS

## 2021-10-16 MED ORDER — ONDANSETRON HCL 4 MG/2ML IJ SOLN
INTRAMUSCULAR | Status: DC | PRN
  Administered 2021-10-16: 4 mg via INTRAVENOUS

## 2021-10-16 MED ORDER — LACTATED RINGERS IV SOLN: INTRAVENOUS | Status: DC

## 2021-10-16 MED ORDER — LIDOCAINE 2% (20 MG/ML) 5 ML SYRINGE
INTRAMUSCULAR | Status: DC | PRN
Start: 2021-10-16 — End: 2021-10-16
  Administered 2021-10-16: 100 mg via INTRAVENOUS

## 2021-10-16 MED ORDER — PROPOFOL 10 MG/ML IV BOLUS
INTRAVENOUS | Status: DC | PRN
  Administered 2021-10-16: 60 mg via INTRAVENOUS

## 2021-10-16 SURGICAL SUPPLY — 15 items

## 2021-10-16 NOTE — Anesthesia Preprocedure Evaluation (Addendum)
Anesthesia Evaluation  Patient identified by MRN, date of birth, ID band Patient awake    Reviewed: Allergy & Precautions, NPO status , Patient's Chart, lab work & pertinent test results  Airway Mallampati: III       Dental  (+) Dental Advisory Given, Missing,    Pulmonary sleep apnea , former smoker,    Pulmonary exam normal        Cardiovascular hypertension, Pt. on home beta blockers + CAD and + Cardiac Stents  Normal cardiovascular exam+ dysrhythmias   Echo: 1. Left ventricular ejection fraction, by estimation, is 50 to 55%. The  left ventricle has low normal function. The left ventricle demonstrates  regional wall motion abnormalities. Apical hypokinesis. There is moderate  asymmetric left ventricular  hypertrophy of the basal-septal segment. Left ventricular diastolic  parameters are indeterminate.  2. Right ventricular systolic function is normal. The right ventricular  size is mildly enlarged. Tricuspid regurgitation signal is inadequate for  assessing PA pressure.  3. The mitral valve is normal in structure. No evidence of mitral valve  regurgitation. No evidence of mitral stenosis.  4. The aortic valve is tricuspid. Aortic valve regurgitation is not  visualized. Mild aortic valve sclerosis is present, with no evidence of  aortic valve stenosis.  5. Aortic dilatation noted. There is dilatation of the ascending aorta,  measuring 44 mm.    Neuro/Psych negative neurological ROS  negative psych ROS   GI/Hepatic Neg liver ROS, GERD  Medicated,  Endo/Other  diabetes, Type 2, Insulin Dependent  Renal/GU Renal disease     Musculoskeletal   Abdominal Normal abdominal exam  (+)   Peds  Hematology   Anesthesia Other Findings   Reproductive/Obstetrics                           Anesthesia Physical Anesthesia Plan  ASA: 3  Anesthesia Plan: MAC   Post-op Pain Management:     Induction: Intravenous  PONV Risk Score and Plan: 0 and Propofol infusion  Airway Management Planned: Simple Face Mask and Natural Airway  Additional Equipment: None  Intra-op Plan:   Post-operative Plan:   Informed Consent:   Plan Discussed with: CRNA  Anesthesia Plan Comments:         Anesthesia Quick Evaluation

## 2021-10-16 NOTE — Transfer of Care (Signed)
Immediate Anesthesia Transfer of Care Note  Patient: Hector Curry  Procedure(s) Performed: ESOPHAGOGASTRODUODENOSCOPY (EGD) WITH PROPOFOL STENT REMOVAL  Patient Location: PACU  Anesthesia Type:MAC  Level of Consciousness: sedated  Airway & Oxygen Therapy: Patient Spontanous Breathing and Patient connected to face mask oxygen  Post-op Assessment: Report given to RN and Post -op Vital signs reviewed and stable  Post vital signs: Reviewed and stable  Last Vitals:  Vitals Value Taken Time  BP 101/58 10/16/21 1120  Temp    Pulse 65 10/16/21 1120  Resp 19 10/16/21 1120  SpO2 100 % 10/16/21 1120  Vitals shown include unvalidated device data.  Last Pain:  Vitals:   10/16/21 1013  TempSrc:   PainSc: 0-No pain         Complications: No notable events documented.

## 2021-10-16 NOTE — Anesthesia Postprocedure Evaluation (Signed)
Anesthesia Post Note  Patient: Hector Curry  Procedure(s) Performed: ESOPHAGOGASTRODUODENOSCOPY (EGD) WITH PROPOFOL STENT REMOVAL     Patient location during evaluation: PACU Anesthesia Type: MAC Level of consciousness: awake and alert Pain management: pain level controlled Vital Signs Assessment: post-procedure vital signs reviewed and stable Respiratory status: spontaneous breathing, nonlabored ventilation, respiratory function stable and patient connected to nasal cannula oxygen Cardiovascular status: stable and blood pressure returned to baseline Postop Assessment: no apparent nausea or vomiting Anesthetic complications: no   No notable events documented.  Last Vitals:  Vitals:   10/16/21 1130 10/16/21 1140  BP: 101/66 120/67  Pulse: 62 (!) 59  Resp: 17 14  Temp:    SpO2: 93% 96%    Last Pain:  Vitals:   10/16/21 1137  TempSrc:   PainSc: 0-No pain                 Effie Berkshire

## 2021-10-16 NOTE — Discharge Instructions (Signed)
YOU HAD AN ENDOSCOPIC PROCEDURE TODAY: Refer to the procedure report and other information in the discharge instructions given to you for any specific questions about what was found during the examination. If this information does not answer your questions, please call [place] office at [phone] to clarify.  ° °YOU SHOULD EXPECT: Some feelings of bloating in the abdomen. Passage of more gas than usual. Walking can help get rid of the air that was put into your GI tract during the procedure and reduce the bloating.. ° °DIET: Your first meal following the procedure should be a light meal and then it is ok to progress to your normal diet. A half-sandwich or bowl of soup is an example of a good first meal. Heavy or fried foods are harder to digest and may make you feel nauseous or bloated. Drink plenty of fluids but you should avoid alcoholic beverages for 24 hours. If you had a esophageal dilation, please see attached instructions for diet.   ° °ACTIVITY: Your care partner should take you home directly after the procedure. You should plan to take it easy, moving slowly for the rest of the day. You can resume normal activity the day after the procedure however YOU SHOULD NOT DRIVE, use power tools, machinery or perform tasks that involve climbing or major physical exertion for 24 hours (because of the sedation medicines used during the test).  ° °SYMPTOMS TO REPORT IMMEDIATELY: °A gastroenterologist can be reached at any hour. Please call [phone]  for any of the following symptoms:  ° °Following upper endoscopy (EGD, EUS, ERCP, esophageal dilation) °Vomiting of blood or coffee ground material  °New, significant abdominal pain  °New, significant chest pain or pain under the shoulder blades  °Painful or persistently difficult swallowing  °New shortness of breath  °Black, tarry-looking or red, bloody stools ° °FOLLOW UP:  °If any biopsies were taken you will be contacted by phone or by letter within the next 1-3  weeks. Call [phone]  if you have not heard about the biopsies in 3 weeks.  °Please also call with any specific questions about appointments or follow up tests. ° °

## 2021-10-16 NOTE — Op Note (Addendum)
[facility] Patient Name: Hector Curry Procedure Date: 10/16/2021 MRN: [id number] Attending MD: Justice Britain , MD Date of Birth: 03/20/1961 CSN: [id number] Age: 61 Admit Type: Outpatient Procedure:                Upper GI endoscopy Indications:              Dysphagia, Stent removal, Follow-up of                            post-bariatric anastomotic stenosis Providers:                Justice Britain, MD, Doristine Johns, RN,                            Luan Moore, Technician, Marla Roe, CRNA Referring MD:             Arta Bruce Kinsinger MD, MD, Leonie Douglas. Doy Hutching, MD Medicines:                Monitored Anesthesia Care Complications:            No immediate complications. Estimated Blood Loss:     Estimated blood loss was minimal. Procedure:                Pre-Anesthesia Assessment:                           - Prior to the procedure, a History and Physical                            was performed, and patient medications and                            allergies were reviewed. The patient's tolerance of                            previous anesthesia was also reviewed. The risks                            and benefits of the procedure and the sedation                            options and risks were discussed with the patient.                            All questions were answered, and informed consent                            was obtained. Prior Anticoagulants: The patient                            last took Plavix (clopidogrel) 5 days prior to the                            procedure and has taken no previous anticoagulant  or antiplatelet agents except for aspirin. ASA                            Grade Assessment: III - A patient with severe                            systemic disease. After reviewing the risks and                            benefits, the patient was deemed in satisfactory                             condition to undergo the procedure.                           After obtaining informed consent, the endoscope was                            passed under direct vision. Throughout the                            procedure, the patient's blood pressure, pulse, and                            oxygen saturations were monitored continuously. The                            [id number] ([id number]) Olympus therapeutic endoscope                            was introduced through the mouth, and advanced to                            the jejunum. The upper GI endoscopy was                            accomplished without difficulty. The patient                            tolerated the procedure. Scope In: Scope Out: Findings:      No gross lesions were noted in the entire esophagus.      A previously placed AXIOS 20 mm LAMS stent was found in the gastric       pouch. Stent removal was accomplished with a rat-toothed forceps.      Localized severe mucosal changes characterized by friability (with       spontaneous bleeding), granularity and inflammation were found in the       entire examined gastric pouch due to the stent having been in place asnd       some tissue ingrowth along the edge.      Evidence of the previous gastrojejunostomy anastomosis was found with an       intact appearance. The stenosis is not present at this point in time and       easily traversible.      Normal mucosa was  found in the examined jejunum. Impression:               - No gross lesions in esophagus.                           - Pre-existing AXIOS LAMS stent, removed.                           Dionisio David (with spontaneous bleeding), granular and                            inflamed mucosa in the remaining gastric pouch from                            the stent having been in place.                           - The previous anastomosis stricture is not present.                           - Normal mucosa was found in the examined  jejunum. Moderate Sedation:      Not Applicable - Patient had care per Anesthesia. Recommendation:           - The patient will be observed post-procedure,                            until all discharge criteria are met.                           - Discharge patient to home.                           - Full liquids today and if able tomorrow. Then may                            advance diet to soft diet for a week and then go                            from there.                           - Restart Plavix tomorrow.                           - Continue Protonix twice daily.                           - Restart Carafate with each meal and at bedtime.                            Do this for at least 1 month.                           - Observe patient's clinical course.                           -  Hopefully patient will not have recurrence of                            issues now that we have his anastomosis 20 mm.                            However, with the inflammatory process occuring                            currently from the stent being in place we will                            have to monitor this and hopefully not allow this                            to stricture back down.                           - If issues recur, recommend a Barium Swallow study.                           - Stricture dilation/stricturoplasty and steroid                            injection may need to be considered, and last ditch                            of surgical anastomosis redo (hopefully we never                            get to this position).                           - Follow up in clinic in 6-weeks. Will discuss his                            colonoscopy surveillance which is due at some point                            this year.                           - The findings and recommendations were discussed                            with the patient.                           - The findings  and recommendations were discussed                            with the patient's family. Procedure Code(s):        --- Professional ---  [phone], Esophagogastroduodenoscopy, flexible,                            transoral; with removal of foreign body(s) Diagnosis Code(s):        --- Professional ---                           Z97.8, Presence of other specified devices                           K92.2, Gastrointestinal hemorrhage, unspecified                           K31.89, Other diseases of stomach and duodenum                           K29.70, Gastritis, unspecified, without bleeding                           Z98.0, Intestinal bypass and anastomosis status                           R13.10, Dysphagia, unspecified                           Z46.59, Encounter for fitting and adjustment of                            other gastrointestinal appliance and device                           U76.54, Other complications of other bariatric                            procedure CPT copyright 2019 American Medical Association. All rights reserved. The codes documented in this report are preliminary and upon coder review may  be revised to meet current compliance requirements. Justice Britain, MD 10/16/2021 11:27:34 AM Number of Addenda: 0

## 2021-10-16 NOTE — H&P (Signed)
GASTROENTEROLOGY PROCEDURE H&P NOTE   Primary Care Physician: [name], MD  HPI: Hector Curry is a 61 y.o. male who presents for EGD with stent pull for AXIOS for GJ anastomosis stricture.  Past Medical History:  Diagnosis Date   Allergic rhinitis    Atrial tachycardia ([place])    CAD (coronary artery disease)    a. 05/08/2015 Cath: no significant CAD, LVEF nl-->Med; b. 07/2017 MV: attenuation artifact, no ischemia, EF 65%-->Low risk; c. 03/2021 NSTEMI/PCI: LM nl, LAD 80p (3.0x18 Onyx Frontier DES), D1 90 (too small for PCI), LCX nl, RCA nl.   Diabetes mellitus without complication (HCC)    Diastolic dysfunction    a. 04/2015 Echo: EF 60-65%; b. 05/2016 Echo: EF 60-65%, GrI DD; c. 07/2017 Echo: EF 55-60%, Ao root 83mm; d. 03/2019 Echo: EF 55-60%, Ao root/Asc Ao 58mm; e. 03/2021 Echo: EF 50-55%, apical HK, mod asymm basal-septal hypertrophy. Nl RV fxn. Asc Ao 59mm.   Dilated aortic root ([place])    a. 07/2017 Echo: 19mm Ao root - mildly dil; b. 03/2019 Echo: Ao root 72mm; c. 03/2021 Echo: Asc Ao 36mm.   Diverticulosis 10/03/2014   Dysrhythmia    Esophageal stricture    a.  In setting of lap band June 2022-stricture at the [place] anastomosis requiring gastrostomy tube; b. 05/2021 s/p esoph stenting (68mm); c. 07/2021 s/p esoph stenting (38mm).   Family history of premature CAD    a. father passed from MI at 44   GERD (gastroesophageal reflux disease)    GIB (gastrointestinal bleeding)    a. 07/2021 following esoph stenting.   Hemorrhoids    a. internal hemorrhoids s/p surgery 1999   History of kidney stones    History of tobacco abuse    Hyperlipidemia    Hyperplastic colon polyp 10/03/2014   a. x 2    Hypertension    Inflammatory arthritis    a. CCP antibodies & x-rays negative. Rheumatoid factor 14, felt to be crystaline over RA or psoriatic   Morbid obesity ([place])    a. s/p LAP-BAND - complicated by esoph stricture.   OSA (obstructive sleep apnea)    a. on CPAP    Osteoarthritis    PSVT (paroxysmal supraventricular tachycardia) (HCC)    a. 48 hr Holter 04/2015: NSR w/ rare PVC, short runs of narrow complex tachycardiac, possible atrial tach, longest run 7 beats, PACs noted (2% of all beats 3600 total) they did not seem to correlate w/ significant arrythmia; b. 08/2017 Event monitor: no significant arrhythmias.   Past Surgical History:  Procedure Laterality Date   BALLOON DILATION N/A 08/14/2021   Procedure: BALLOON DILATION;  Surgeon: Mansouraty, Telford Nab., MD;  Location: [facility];  Service: Gastroenterology;  Laterality: N/A;   BARIATRIC SURGERY     lap band    BILIARY STENT PLACEMENT N/A 08/14/2021   Procedure: AXIOS STENT PLACEMENT;  Surgeon: Irving Copas., MD;  Location: [facility];  Service: Gastroenterology;  Laterality: N/A;   BIOPSY  07/08/2021   Procedure: BIOPSY;  Surgeon: Rush Landmark Telford Nab., MD;  Location: [facility];  Service: Gastroenterology;;   BIOPSY  08/14/2021   Procedure: BIOPSY;  Surgeon: Irving Copas., MD;  Location: [facility];  Service: Gastroenterology;;   CARDIAC CATHETERIZATION N/A 05/08/2015   Procedure: Left Heart Cath and Coronary Angiography;  Surgeon: Jettie Booze, MD;  Location: [facility];  Service: Cardiovascular;  Laterality: N/A;   CARPAL TUNNEL RELEASE Left    CHOLECYSTECTOMY  COLONOSCOPY  06/27/2005   COLONOSCOPY  10/03/2014   CORONARY STENT INTERVENTION N/A 04/15/2021   Procedure: CORONARY STENT INTERVENTION;  Surgeon: Lorretta Harp, MD;  Location: [facility];  Service: Cardiovascular;  Laterality: N/A;   ESOPHAGEAL DILATION  05/27/2021   Procedure: ESOPHAGEAL DILATION;  Surgeon: Rush Landmark Telford Nab., MD;  Location: [facility];  Service: Gastroenterology;;   ESOPHAGEAL STENT PLACEMENT N/A 05/27/2021   Procedure: ESOPHAGEAL STENT PLACEMENT;  Surgeon: Irving Copas., MD;  Location: [facility];  Service: Gastroenterology;  Laterality:  N/A;   ESOPHAGOGASTRODUODENOSCOPY  06/27/2005   ESOPHAGOGASTRODUODENOSCOPY N/A 03/19/2021   Procedure: ESOPHAGOGASTRODUODENOSCOPY (EGD);  Surgeon: Kieth Brightly, Arta Bruce, MD;  Location: [facility];  Service: General;  Laterality: N/A;   ESOPHAGOGASTRODUODENOSCOPY N/A 08/16/2021   Procedure: ESOPHAGOGASTRODUODENOSCOPY (EGD);  Surgeon: Irving Copas., MD;  Location: [facility];  Service: Gastroenterology;  Laterality: N/A;   ESOPHAGOGASTRODUODENOSCOPY (EGD) WITH PROPOFOL N/A 05/27/2021   Procedure: ESOPHAGOGASTRODUODENOSCOPY (EGD) WITH PROPOFOL;  Surgeon: Rush Landmark Telford Nab., MD;  Location: [facility];  Service: Gastroenterology;  Laterality: N/A;   ESOPHAGOGASTRODUODENOSCOPY (EGD) WITH PROPOFOL N/A 07/08/2021   Procedure: ESOPHAGOGASTRODUODENOSCOPY (EGD) WITH PROPOFOL;  Surgeon: Rush Landmark Telford Nab., MD;  Location: [facility];  Service: Gastroenterology;  Laterality: N/A;  fluoro   ESOPHAGOGASTRODUODENOSCOPY (EGD) WITH PROPOFOL N/A 08/14/2021   Procedure: ESOPHAGOGASTRODUODENOSCOPY (EGD) WITH PROPOFOL;  Surgeon: Rush Landmark Telford Nab., MD;  Location: [facility];  Service: Gastroenterology;  Laterality: N/A;  fluoro Axios stent (20 mm)   FLEXIBLE SIGMOIDOSCOPY N/A 08/16/2021   Procedure: FLEXIBLE SIGMOIDOSCOPY;  Surgeon: Rush Landmark Telford Nab., MD;  Location: [facility];  Service: Gastroenterology;  Laterality: N/A;   GASTRIC ROUX-EN-Y N/A 02/18/2021   Procedure: LAPAROSCOPIC ROUX-EN-Y GASTRIC BYPASS WITH UPPER ENDOSCOPY,;  Surgeon: Kieth Brightly Arta Bruce, MD;  Location: [facility];  Service: General;  Laterality: N/A;   HEMORRHOID SURGERY  08/25/1997   IR [place] TUBE CHANGE  03/27/2021   LAPAROSCOPIC INSERTION GASTROSTOMY TUBE N/A 03/20/2021   Procedure: LAPAROSCOPIC INSERTION GASTROSTOMY TUBE;  Surgeon: Mickeal Skinner, MD;  Location: [facility];  Service: General;  Laterality: N/A;   LEFT HEART CATH AND CORONARY ANGIOGRAPHY N/A 04/15/2021   Procedure: LEFT HEART CATH AND CORONARY  ANGIOGRAPHY;  Surgeon: Lorretta Harp, MD;  Location: [facility];  Service: Cardiovascular;  Laterality: N/A;   MOUTH SURGERY     removed area which was benign   STENT REMOVAL  07/08/2021   Procedure: STENT REMOVAL;  Surgeon: Irving Copas., MD;  Location: [facility];  Service: Gastroenterology;;   TONSILLECTOMY     UPPER GI ENDOSCOPY N/A 03/20/2021   Procedure: UPPER GI ENDOSCOPY;  Surgeon: Kieth Brightly Arta Bruce, MD;  Location: [facility];  Service: General;  Laterality: N/A;   Current Facility-Administered Medications  Medication Dose Route Frequency Provider Last Rate Last Admin   lactated ringers infusion   Intravenous Continuous Mansouraty, Telford Nab., MD 10 mL/hr at 10/16/21 1027 New Bag at 10/16/21 1027    Current Facility-Administered Medications:    lactated ringers infusion, , Intravenous, Continuous, Mansouraty, Telford Nab., MD, Last Rate: 10 mL/hr at 10/16/21 1027, New Bag at 10/16/21 1027 Allergies  Allergen Reactions   Other Other (See Comments)    Cats   Family History  Problem Relation Age of Onset   Hypertension Mother    Diabetes Mother    Heart attack Father 57   CAD Father    Hypertension Sister    Diabetes Other    Hypertension Other    Heart disease Other  Colon cancer Neg Hx    Esophageal cancer Neg Hx    Pancreatic cancer Neg Hx    Stomach cancer Neg Hx    Liver disease Neg Hx    Inflammatory bowel disease Neg Hx    Rectal cancer Neg Hx    Social History   Socioeconomic History   Marital status: Married    Spouse name: Not on file   Number of children: Not on file   Years of education: Not on file   Highest education level: Not on file  Occupational History   Not on file  Tobacco Use   Smoking status: Former    Packs/day: 1.00    Years: 15.00    Pack years: 15.00    Types: Cigarettes    Quit date: 08/05/1989    Years since quitting: 32.2   Smokeless tobacco: Never  Vaping Use   Vaping Use: Never used  Substance  and Sexual Activity   Alcohol use: Never   Drug use: Never   Sexual activity: Not on file  Other Topics Concern   Not on file  Social History Narrative   Not on file   Social Determinants of Health   Financial Resource Strain: Not on file  Food Insecurity: Not on file  Transportation Needs: Not on file  Physical Activity: Not on file  Stress: Not on file  Social Connections: Not on file  Intimate Partner Violence: Not on file    Physical Exam: Today's Vitals   10/16/21 1008 10/16/21 1013  BP: 127/82   Pulse: 68   Resp: 11   Temp: 97.9 F (36.6 C)   TempSrc: Oral   SpO2: 96%   Weight: 105.7 kg   Height: 6\' 2"  (1.88 m)   PainSc:  0-No pain   Body mass index is 29.92 kg/m. GEN: NAD EYE: Sclerae anicteric ENT: MMM CV: Non-tachycardic GI: Soft, NT/ND NEURO:  Alert & Oriented x 3  Lab Results: No results for input(s): WBC, HGB, HCT, PLT in the last 72 hours. BMET No results for input(s): NA, K, CL, CO2, GLUCOSE, BUN, CREATININE, CALCIUM in the last 72 hours. LFT No results for input(s): PROT, ALBUMIN, AST, ALT, ALKPHOS, BILITOT, BILIDIR, IBILI in the last 72 hours. PT/INR No results for input(s): LABPROT, INR in the last 72 hours.   Impression / Plan: This is a 61 y.o.male who presents for EGD with stent pull for AXIOS for GJ anastomosis stricture.  The risks and benefits of endoscopic evaluation/treatment were discussed with the patient and/or family; these include but are not limited to the risk of perforation, infection, bleeding, missed lesions, lack of diagnosis, severe illness requiring hospitalization, as well as anesthesia and sedation related illnesses.  The patient's history has been reviewed, patient examined, no change in status, and deemed stable for procedure.  The patient and/or family is agreeable to proceed.    Justice Britain, MD [facility] # [phone]

## 2021-10-17 ENCOUNTER — Encounter (HOSPITAL_COMMUNITY): Payer: Self-pay | Admitting: Gastroenterology

## 2021-10-18 ENCOUNTER — Telehealth: Payer: Self-pay | Admitting: Gastroenterology

## 2021-10-18 ENCOUNTER — Other Ambulatory Visit (HOSPITAL_COMMUNITY): Payer: Self-pay

## 2021-10-18 NOTE — Telephone Encounter (Signed)
Inbound call from patient stating he has been vomiting since yesterday.  Please advise.

## 2021-10-18 NOTE — Telephone Encounter (Signed)
Left message on machine to call back  

## 2021-10-21 ENCOUNTER — Telehealth (HOSPITAL_COMMUNITY): Payer: Self-pay

## 2021-10-21 NOTE — Telephone Encounter (Signed)
I have tried to reach the pt by phone to discuss his concerns with no answer and return call.  Will await further communication from the pt.

## 2021-10-21 NOTE — Telephone Encounter (Signed)
Transitions of Care Pharmacy  ° °Call attempted for a pharmacy transitions of care follow-up. HIPAA appropriate voicemail was left with call back information provided.  ° °Call attempt #1. Will follow-up in 2-3 days.  °  °

## 2021-10-22 ENCOUNTER — Encounter: Payer: Self-pay | Admitting: Gastroenterology

## 2021-10-22 ENCOUNTER — Telehealth (HOSPITAL_COMMUNITY): Payer: Self-pay

## 2021-10-22 ENCOUNTER — Other Ambulatory Visit (HOSPITAL_COMMUNITY): Payer: Self-pay

## 2021-10-22 NOTE — Telephone Encounter (Signed)
Transitions of Care Pharmacy   Call attempted for a pharmacy transitions of care follow-up. HIPAA appropriate voicemail was left with call back information provided.   Call attempt #2. Will follow-up in 2-3 days.    

## 2021-10-22 NOTE — Telephone Encounter (Signed)
[name], Please let the patient know that hopefully this is just reaction to [place] having removed the stent. With his issues previously and with him having had to have a repeat stent replaced due to issues post-procedurally in the past, I think a Work Note is reasonable. Please let him have a Work note from the day of his procedure through Friday of last week. Please let him know if issues are persisting into the end of this week/early next week, then he should let us know and we will need to consider the Barium Swallow to see where things stand. Thank you. GM

## 2021-10-23 ENCOUNTER — Other Ambulatory Visit (HOSPITAL_COMMUNITY): Payer: Self-pay

## 2021-10-24 ENCOUNTER — Telehealth (HOSPITAL_COMMUNITY): Payer: Self-pay

## 2021-10-24 NOTE — Telephone Encounter (Signed)
Transitions of Care Pharmacy   Call attempted for a pharmacy transitions of care follow-up. HIPAA appropriate voicemail was left with call back information provided.   Call attempt #3. Will no longer attempt follow up for TOC pharmacy.   

## 2021-11-04 ENCOUNTER — Other Ambulatory Visit: Payer: Self-pay

## 2021-11-04 ENCOUNTER — Other Ambulatory Visit (HOSPITAL_COMMUNITY): Payer: Self-pay

## 2021-11-04 ENCOUNTER — Encounter: Payer: Self-pay | Admitting: Gastroenterology

## 2021-11-04 DIAGNOSIS — R1319 Other dysphagia: Secondary | ICD-10-CM

## 2021-11-04 DIAGNOSIS — K9189 Other postprocedural complications and disorders of digestive system: Secondary | ICD-10-CM

## 2021-11-04 NOTE — Telephone Encounter (Signed)
Please let [name] know that I am sorry to hear this. ?Let us go ahead and move forward with the barium swallow/upper GI series to define his anatomy. ?I will need to set him up for an endoscopy tentatively during my hospital week. ?Okay for the 27th or 29th or 30th as a 45-minute EGD. ?Hopefully we can get that swallow done this week. ?Thanks. ?GM ?

## 2021-11-04 NOTE — Telephone Encounter (Signed)
Orders placed for Barium swallow and UGI series. Message sent to [facility] Scheduling for scheduling purposes. Awaiting their response re: appt date/time. My Chart message sent to pt informing him of Dr. Elesa Hacker POT. Provided him with contact # for follow up and/or self scheduling for Barium Swallow and UGI series. Also awaiting his response re: scheduling EGD. Routing this message to Selinda Michaels, RN for further follow up. ?

## 2021-11-05 ENCOUNTER — Telehealth: Payer: Self-pay

## 2021-11-05 ENCOUNTER — Other Ambulatory Visit: Payer: Self-pay

## 2021-11-05 DIAGNOSIS — K9189 Other postprocedural complications and disorders of digestive system: Secondary | ICD-10-CM

## 2021-11-05 DIAGNOSIS — R1319 Other dysphagia: Secondary | ICD-10-CM

## 2021-11-05 NOTE — Telephone Encounter (Signed)
Following instructions received from Dr. Rush Landmark: ? ?Let us go ahead and move forward with the barium swallow/upper GI series to define his anatomy. ?I will need to set him up for an endoscopy tentatively during my hospital week. Okay for the 27th or 29th or 30th as a 45-minute EGD. Hopefully we can get that swallow done this week. ?Thanks. ?GM ? ?Appears Central Scheduling has been trying to reach pt to schedule his Barium Swallow and UGI series. Pt has been provided with their contact # and has been advised to return their call to schedule his exam. ? ?In addition, pt has been scheduled for EGD @ [facility] on 11/18/21 @ 11am, arrival time 945am. Prep instructions sent to pt via My Chart with yet another reminder to ensure he returns Central Scheduling call re: scheduling of Barium Swallow and UGI series. Also sent clearance request re: holding Plavix. Awaiting response from cardio. ?

## 2021-11-05 NOTE — Telephone Encounter (Signed)
[facility] Pre-operative Risk Assessment  ?   ?Hector Curry ?September 12, 1960 ?[id number] ? ?Procedure: EGD ?Anesthesia type:  MAC ?Procedure Date: 11/18/21 ?Provider: Dr. Rush Landmark ? ?Type of Clearance needed: Pharmacy ? ?Medication(s) needing held: Plavix  ? ?Length of time for medication to be held: 5 days ? ?Please review request and advise by either responding to this message or by sending your response to the fax # provided below. ? ?Thank you, ? ?[facility]  ?Phone: [phone] ?Fax: [phone] ?ATTENTION: Mario Coronado, LPN ? ?  ?

## 2021-11-05 NOTE — Addendum Note (Signed)
Addended by: Hardie Pulley, Nalia Honeycutt J on: 11/05/2021 07:52 AM ? ? Modules accepted: Orders ? ?

## 2021-11-05 NOTE — Telephone Encounter (Signed)
Left a message for the patient to call back and speak to the on-call preop APP of the day. ? ?Dr. Mariah Milling to review, this is a 61 year old male with prior history of CAD Hoel underwent DES to proximal LAD for NSTEMI on 04/15/2021, postprocedure, he had 90% mid to distal D1 residual.  He appears to be on Plavix monotherapy at this time.  He was last seen by Dr. Mariah Milling on 09/23/2021 at which time he was stable from the cardiac perspective.  He had follow-up procedure with Dr. Lemar Lofty. previous note mentioned "if he needs to hold Plavix 5 days before stent removal, he consider taking aspirin 81 mg daily instead.  He will discuss this with doctors performing procedure".  Patient underwent EGD and had AXIOS stent removed on 10/16/2021. ? ?He has upcoming repeat EGD as well, last stent placement was over 6 months ago, will the previous recommendation of switching Plavix to aspirin still apply? ?

## 2021-11-05 NOTE — Telephone Encounter (Signed)
My Chart message sent to pt with date/time/arrival time/location for Barium Swallow and UGI series. Also called pt to inform his about this appt as well. LVM requesting returned call. ? ?In addition to above, also noticed pt has not viewed his My Chart message that included his prep instructions. Also called to inform pt about date/time/arrival time/location and prep instructions for EGD. As stated above, LVM requesting returned call. ? ?At the time of this entry, still awaiting response from cardio re: holding Plavix. Pt will receive a call and My Chart message with further instructions about his Plavix once received. ? ? ?

## 2021-11-06 NOTE — Telephone Encounter (Signed)
Routing this message to Dr. Elesa Hacker RN, Hilma Favors so she can continue efforts to f/u. ?

## 2021-11-06 NOTE — Telephone Encounter (Signed)
The pt has been advised of all appt information.  He is aware to stop plavix 5 days prior to the 3/27 case.  He also has the barium appt and instructions ?

## 2021-11-06 NOTE — Telephone Encounter (Signed)
At the time of this entry, pt has not returned calls nor has read My Chart messages. Routing this message to Dr. Donneta Romberg RN, Koren Shiver, so she can continue efforts to f/u with pt. ?

## 2021-11-06 NOTE — Telephone Encounter (Signed)
Eversole, Ammie, LPN routed conversation to You 24 minutes ago (8:47 AM)  ? ?Eversole, Ammie, LPN 24 minutes ago (4:96 AM)  ? ?Routing this message to Dr. Donneta Romberg RN, Koren Shiver so she can continue efforts to f/u.  ?  ?  ?Note   ? ?Almyra Deforest, [place] routed conversation to Minna Merritts, MD 22 hours ago (10:50 AM)  ? ?Almyra Deforest, [place] 22 hours ago (10:50 AM)  ? ?Left a message for the patient to call back and speak to the on-call preop APP of the day. ?  ?Dr. Rockey Situ to review, this is a 61 year old male with prior history of CAD Hoel underwent DES to proximal LAD for NSTEMI on 04/15/2021, postprocedure, he had 90% mid to distal D1 residual.  He appears to be on Plavix monotherapy at this time.  He was last seen by Dr. Rockey Situ on 09/23/2021 at which time he was stable from the cardiac perspective.  He had follow-up procedure with Dr. Irving Copas. previous note mentioned "if he needs to hold Plavix 5 days before stent removal, he consider taking aspirin 81 mg daily instead.  He will discuss this with doctors performing procedure".  Patient underwent EGD and had AXIOS stent removed on 10/16/2021. ?  ?He has upcoming repeat EGD as well, last stent placement was over 6 months ago, will the previous recommendation of switching Plavix to aspirin still apply?  ?  ?  ?Note   ? ?Eversole, Ammie, LPN  Cv Div Preop 22 hours ago (10:19 AM)  ? ?Please review and advise   ? ?Aleatha Borer, LPN  Gollan, Kathlene November, MD; Aleatha Borer, LPN Yesterday (7:59 AM)  ? ?Please review and advise   ? ?Eversole, Ammie, LPN Yesterday (1:63 AM)  ? ?[facility] Pre-operative Risk Assessment  ?   ?Hector Curry ?06/23/1961 ?[id number] ?  ?Procedure: EGD ?Anesthesia type:  MAC ?Procedure Date: 11/18/21 ?Provider: Dr. Rush Landmark ?  ?Type of Clearance needed: Pharmacy ? ?Medication(s) needing held: Plavix  ?  ?Length of time for medication to be held: 5 days ?  ?Please review request and advise by either  responding to this message or by sending your response to the fax # provided below. ?  ?Thank you, ?  ?[facility]  ?Phone: [phone] ?Fax: [phone] ?ATTENTION: [name], LPN ? ?   ?  ?  ?Note   ? ?

## 2021-11-06 NOTE — Telephone Encounter (Signed)
Hi Hector Curry, ?  ?It appears Central Scheduling has notified you this morning to schedule your Barium Swallow and upper GI series. Please return their call at [phone]. ?  ?Thank you ?  ? ?Hector Curry  P Lgi Clinical Pool (supporting Mansouraty, Telford Nab., MD) 2 days ago  ? ?Please set me up for both.  Also, do I need to stop Plavix prior to the Endo? ?  ? ?Eversole, Ammie, LPN  Algernon Huxley, RN 2 days ago  ? ?Did pt advise whether he could schedule hosp procedure 27, 29 or 30? Has pt gotten scheduled for Barium Swallow and UGI series?   ? ?Eversole, Ammie, LPN 2 days ago  ? ?Orders placed for Barium swallow and UGI series. Message sent to [facility] Scheduling for scheduling purposes. Awaiting their response re: appt date/time. My Chart message sent to pt informing him of Dr. Donneta Romberg POT. Provided him with contact # for follow up and/or self scheduling for Barium Swallow and UGI series. Also awaiting his response re: scheduling EGD. Routing this message to Rosanne Sack, RN for further follow up.  ?  ?  ?Note   ? ?Aleatha Borer, LPN  Bohlen, Hector Ion   2 days ago  ? ?My Chart message   ?Outgoing contact (In [place])   ? ?Eversole, Ammie, LPN  Harland German 2 days ago  ? ?Hi Marcelo, ?  ?I am so sorry to hear this. Dr Rush Landmark has been informed about your concerns. He has responded as follows: ?  ?Please arrange for pt to have Barium swallow and upper GI series to define his anatomy. ?I would like to schedule an upper endoscopy tentatively during my hospital week, ideally March 27, 29 or 30. Would any of these dates work better for you? ?You are welcome to call [phone] to schedule the Barium swallow and upper GI series, ideally, sometime this week. ?  ?Thanks ?  ? ?Mansouraty, Telford Nab., MD  Aleatha Borer, LPN 2 days ago  ? ?Please let Mr. Ellers know that I am sorry to hear this. ?Let us go ahead and move forward with the barium swallow/upper GI series to define his anatomy. ?I will  need to set him up for an endoscopy tentatively during my hospital week. ?Okay for the 27th or 29th or 30th as a 45-minute EGD. ?Hopefully we can get that swallow done this week. ?Thanks. ?GM  ?  ?  ?Note   ? ?Aleatha Borer, LPN  Mansouraty, Telford Nab., MD 2 days ago  ? ?You had requested pt provide an update this week. Do you want to consider the Barium Swallow to see where things stand?   ? ?Harland German  P Lgi Clinical Pool (supporting Mansouraty, Telford Nab., MD) 2 days ago  ? ?Wish it was better news.  I am still throwing up and having issues swallowing.  Eating soft oatmeal, soft grits and soups.         :-( ?  ? ?

## 2021-11-07 ENCOUNTER — Encounter (HOSPITAL_COMMUNITY): Payer: Self-pay | Admitting: Gastroenterology

## 2021-11-08 NOTE — Progress Notes (Signed)
Attempted to obtain medical history via telephone, unable to reach at this time. I left a voicemail to return pre surgical testing department's phone call.  

## 2021-11-12 ENCOUNTER — Other Ambulatory Visit (HOSPITAL_COMMUNITY): Payer: Self-pay

## 2021-11-14 ENCOUNTER — Ambulatory Visit (HOSPITAL_COMMUNITY): Payer: No Typology Code available for payment source | Attending: Gastroenterology

## 2021-11-14 ENCOUNTER — Encounter (HOSPITAL_COMMUNITY): Payer: Self-pay

## 2021-11-14 ENCOUNTER — Telehealth: Payer: Self-pay

## 2021-11-14 ENCOUNTER — Ambulatory Visit (HOSPITAL_COMMUNITY): Admission: RE | Admit: 2021-11-14 | Payer: No Typology Code available for payment source | Source: Ambulatory Visit

## 2021-11-14 NOTE — Telephone Encounter (Signed)
Placed a call to the pt. No answer message left on voicemail.  I advised the pt via voicemail that he pt can proceed with EGD as planned.  I have also sent a message to the pt via My Chart.   ?

## 2021-11-14 NOTE — Telephone Encounter (Signed)
Received call from front office staff member, Duke Salvia. Pt informed her that he was not able to have the Barium Swallow completed today because he had not been informed about the pre prior to the exam. Staff member indicated to me that she did inform the pt of our failed telephone attempts to contact pt and that he did in fact read the My Chart message regarding his prep instructions. Staff member indicated that he became argumentative with her and asked me if I could further discuss the issues/concerns with the pt. ? ?Call was transferred. Pt informed me of the issue with his Barium Swallow as previously documented. Pt wanted to know if he should proceed with EGD since he did not have the Barium Swallow. Ample time was provided to pt WITHOUT interruption allowing him to express his concern and voice his question. Once pt paused for my response, advised I would need to discuss further with Dr. Meridee Score to determine how he wished to proceed. Pt sternly stated, "you do realize that I was suppose to have a Barium Swallow and then the EGD." Reassurance provided that I am aware of Dr. Elesa Hacker plan as I had been the nurse whom carried out his orders as clearly planned. However, I could not make the decision re: whether or not Dr. Meridee Score would proceed with EGD without first having the Barium Swallow. Throughout my entire conversation with the pt AND while providing my responses, pt proceed to interject himself. Pt then stated, "I am reporting you for the horrible customer service". Pt was immediately redirected and advised that he was given ample time to express his needs without interruption which is not what he had provided to this nurse in return. Pt then immediately disconnected call. ? ?Informed Dr. Meridee Score of this conversation. Following response received: ? ?My Question: This pt failed to follow prep instructions and was unable to have Barium Swallow completed despite having read the My Chart message  instructions. He has called quite hostile with staff and is wanting to know if he is able to have his procedure WITHOUT Barium Swallow prior? ? ?Dr. Elesa Hacker response: Not ideal, but I'll go ahead with the planned procedure next week.  Thanks for trying to coordinate things.  He is a very nice gentleman so I'm surprised, but we will all survive.  Thanks for trying. ? ?Given pt reaction/response with this nurse, routing Dr. Elesa Hacker response to his nurse so she can inform him about his plan. ?

## 2021-11-15 NOTE — Telephone Encounter (Signed)
Left message on machine to call back  

## 2021-11-15 NOTE — Telephone Encounter (Signed)
Dr. Mariah Milling, please review and provide recommendations on aspirin/Plavix.  Thank you for your help. ? ? ?Thomasene Ripple. Wanna Gully NP-C ? ?  ?11/15/2021, 1:32 PM ?[facility] ?[address] ?Office [phone] Fax [phone] ? ?

## 2021-11-16 ENCOUNTER — Ambulatory Visit (HOSPITAL_COMMUNITY): Payer: No Typology Code available for payment source

## 2021-11-18 ENCOUNTER — Ambulatory Visit (HOSPITAL_COMMUNITY)
Admission: RE | Admit: 2021-11-18 | Discharge: 2021-11-18 | Disposition: A | Discharge status: Home / Self Care | Payer: No Typology Code available for payment source | Source: Ambulatory Visit | Attending: Gastroenterology | Admitting: Gastroenterology

## 2021-11-18 ENCOUNTER — Other Ambulatory Visit (HOSPITAL_COMMUNITY): Payer: Self-pay

## 2021-11-18 ENCOUNTER — Ambulatory Visit (HOSPITAL_COMMUNITY): Payer: No Typology Code available for payment source | Admitting: Certified Registered"

## 2021-11-18 ENCOUNTER — Ambulatory Visit (HOSPITAL_BASED_OUTPATIENT_CLINIC_OR_DEPARTMENT_OTHER): Payer: No Typology Code available for payment source | Admitting: Certified Registered"

## 2021-11-18 ENCOUNTER — Other Ambulatory Visit: Payer: Self-pay

## 2021-11-18 ENCOUNTER — Encounter (HOSPITAL_COMMUNITY)
Admission: RE | Disposition: A | Discharge status: Home / Self Care | Payer: Self-pay | Source: Ambulatory Visit | Attending: Gastroenterology

## 2021-11-18 ENCOUNTER — Encounter (HOSPITAL_COMMUNITY): Payer: Self-pay | Admitting: Gastroenterology

## 2021-11-18 DIAGNOSIS — Z955 Presence of coronary angioplasty implant and graft: Secondary | ICD-10-CM | POA: Diagnosis not present

## 2021-11-18 DIAGNOSIS — G4733 Obstructive sleep apnea (adult) (pediatric): Secondary | ICD-10-CM | POA: Insufficient documentation

## 2021-11-18 DIAGNOSIS — Z833 Family history of diabetes mellitus: Secondary | ICD-10-CM | POA: Diagnosis not present

## 2021-11-18 DIAGNOSIS — K9589 Other complications of other bariatric procedure: Secondary | ICD-10-CM | POA: Diagnosis present

## 2021-11-18 DIAGNOSIS — Z9884 Bariatric surgery status: Secondary | ICD-10-CM | POA: Insufficient documentation

## 2021-11-18 DIAGNOSIS — Z87891 Personal history of nicotine dependence: Secondary | ICD-10-CM | POA: Insufficient documentation

## 2021-11-18 DIAGNOSIS — I2510 Atherosclerotic heart disease of native coronary artery without angina pectoris: Secondary | ICD-10-CM | POA: Insufficient documentation

## 2021-11-18 DIAGNOSIS — I252 Old myocardial infarction: Secondary | ICD-10-CM | POA: Diagnosis not present

## 2021-11-18 DIAGNOSIS — Z980 Intestinal bypass and anastomosis status: Secondary | ICD-10-CM

## 2021-11-18 DIAGNOSIS — K219 Gastro-esophageal reflux disease without esophagitis: Secondary | ICD-10-CM | POA: Diagnosis not present

## 2021-11-18 DIAGNOSIS — Z87442 Personal history of urinary calculi: Secondary | ICD-10-CM | POA: Insufficient documentation

## 2021-11-18 DIAGNOSIS — Z8249 Family history of ischemic heart disease and other diseases of the circulatory system: Secondary | ICD-10-CM | POA: Diagnosis not present

## 2021-11-18 DIAGNOSIS — I10 Essential (primary) hypertension: Secondary | ICD-10-CM | POA: Insufficient documentation

## 2021-11-18 DIAGNOSIS — E119 Type 2 diabetes mellitus without complications: Secondary | ICD-10-CM | POA: Diagnosis not present

## 2021-11-18 HISTORY — PX: DUODENAL STENT PLACEMENT: SHX5541

## 2021-11-18 HISTORY — PX: SUBMUCOSAL INJECTION: SHX5543

## 2021-11-18 HISTORY — PX: ESOPHAGOGASTRODUODENOSCOPY (EGD) WITH PROPOFOL: SHX5813

## 2021-11-18 LAB — GLUCOSE, CAPILLARY: Glucose-Capillary: 108 mg/dL — ABNORMAL HIGH (ref 70–99)

## 2021-11-18 SURGERY — ESOPHAGOGASTRODUODENOSCOPY (EGD) WITH PROPOFOL
Anesthesia: Monitor Anesthesia Care

## 2021-11-18 MED ORDER — TRIAMCINOLONE ACETONIDE 40 MG/ML IJ SUSP
INTRAMUSCULAR | Status: AC
  Filled 2021-11-18: qty 2

## 2021-11-18 MED ORDER — CLOPIDOGREL BISULFATE 75 MG PO TABS: 75.0000 mg | ORAL_TABLET | Freq: Every day | ORAL | 1 refills | Status: DC

## 2021-11-18 MED ORDER — SUCRALFATE 1 G PO TABS
1.0000 g | ORAL_TABLET | Freq: Three times a day (TID) | ORAL | 6 refills | Status: DC
  Filled 2021-11-18: qty 120, 30d supply, fill #0

## 2021-11-18 MED ORDER — PROPOFOL 10 MG/ML IV BOLUS
INTRAVENOUS | Status: DC | PRN
  Administered 2021-11-18: 20 mg via INTRAVENOUS
  Administered 2021-11-18: 10 mg via INTRAVENOUS

## 2021-11-18 MED ORDER — LACTATED RINGERS IV SOLN
INTRAVENOUS | Status: DC | PRN
Start: 2021-11-18 — End: 2021-11-18

## 2021-11-18 MED ORDER — PROPOFOL 500 MG/50ML IV EMUL
INTRAVENOUS | Status: DC | PRN
  Administered 2021-11-18: 125 ug/kg/min via INTRAVENOUS

## 2021-11-18 MED ORDER — TRIAMCINOLONE ACETONIDE 40 MG/ML IJ SUSP
INTRAMUSCULAR | Status: DC | PRN
  Administered 2021-11-18: 40 mg

## 2021-11-18 MED ORDER — PROPOFOL 500 MG/50ML IV EMUL
INTRAVENOUS | Status: AC
  Filled 2021-11-18: qty 50

## 2021-11-18 MED ORDER — PROPOFOL 1000 MG/100ML IV EMUL
INTRAVENOUS | Status: AC
  Filled 2021-11-18: qty 100

## 2021-11-18 SURGICAL SUPPLY — 15 items

## 2021-11-18 NOTE — H&P (Signed)
? ?GASTROENTEROLOGY PROCEDURE H&P NOTE  ? ?Primary Care Physician: ?[name], MD ? ?HPI: ?Hector Curry is a 61 y.o. male who presents for EGD to re-evaluate previous GJ anastomotic stricture, that has had 2 AXIOS stents in last 104-months (15 mm then 20 mm). ? ?Past Medical History:  ?Diagnosis Date  ? Allergic rhinitis   ? Atrial tachycardia ([place])   ? CAD (coronary artery disease)   ? a. 05/08/2015 Cath: no significant CAD, LVEF nl-->Med; b. 07/2017 MV: attenuation artifact, no ischemia, EF 65%-->Low risk; c. 03/2021 NSTEMI/PCI: LM nl, LAD 80p (3.0x18 Onyx Frontier DES), D1 90 (too small for PCI), LCX nl, RCA nl.  ? Diabetes mellitus without complication ([place])   ? Diastolic dysfunction   ? a. 04/2015 Echo: EF 60-65%; b. 05/2016 Echo: EF 60-65%, GrI DD; c. 07/2017 Echo: EF 55-60%, Ao root 65mm; d. 03/2019 Echo: EF 55-60%, Ao root/Asc Ao 20mm; e. 03/2021 Echo: EF 50-55%, apical HK, mod asymm basal-septal hypertrophy. Nl RV fxn. Asc Ao 56mm.  ? Dilated aortic root ([place])   ? a. 07/2017 Echo: 49mm Ao root - mildly dil; b. 03/2019 Echo: Ao root 51mm; c. 03/2021 Echo: Asc Ao 40mm.  ? Diverticulosis 10/03/2014  ? Dysrhythmia   ? Esophageal stricture   ? a.  In setting of lap band June 2022-stricture at the [place] anastomosis requiring gastrostomy tube; b. 05/2021 s/p esoph stenting (63mm); c. 07/2021 s/p esoph stenting (67mm).  ? Family history of premature CAD   ? a. father passed from MI at 74  ? GERD (gastroesophageal reflux disease)   ? GIB (gastrointestinal bleeding)   ? a. 07/2021 following esoph stenting.  ? Hemorrhoids   ? a. internal hemorrhoids s/p surgery 1999  ? History of kidney stones   ? History of tobacco abuse   ? Hyperlipidemia   ? Hyperplastic colon polyp 10/03/2014  ? a. x 2   ? Hypertension   ? Inflammatory arthritis   ? a. CCP antibodies & x-rays negative. Rheumatoid factor 14, felt to be crystaline over RA or psoriatic  ? Morbid obesity ([place])   ? a. s/p LAP-BAND - complicated by esoph stricture.  ?  OSA (obstructive sleep apnea)   ? a. on CPAP  ? Osteoarthritis   ? PSVT (paroxysmal supraventricular tachycardia) ([place])   ? a. 48 hr Holter 04/2015: NSR w/ rare PVC, short runs of narrow complex tachycardiac, possible atrial tach, longest run 7 beats, PACs noted (2% of all beats 3600 total) they did not seem to correlate w/ significant arrythmia; b. 08/2017 Event monitor: no significant arrhythmias.  ? ?Past Surgical History:  ?Procedure Laterality Date  ? BALLOON DILATION N/A 08/14/2021  ? Procedure: BALLOON DILATION;  Surgeon: Irving Copas., MD;  Location: [facility];  Service: Gastroenterology;  Laterality: N/A;  ? BARIATRIC SURGERY    ? lap band   ? BILIARY STENT PLACEMENT N/A 08/14/2021  ? Procedure: AXIOS STENT PLACEMENT;  Surgeon: Irving Copas., MD;  Location: [facility];  Service: Gastroenterology;  Laterality: N/A;  ? BIOPSY  07/08/2021  ? Procedure: BIOPSY;  Surgeon: Irving Copas., MD;  Location: [facility];  Service: Gastroenterology;;  ? BIOPSY  08/14/2021  ? Procedure: BIOPSY;  Surgeon: Irving Copas., MD;  Location: [facility];  Service: Gastroenterology;;  ? CARDIAC CATHETERIZATION N/A 05/08/2015  ? Procedure: Left Heart Cath and Coronary Angiography;  Surgeon: Jettie Booze, MD;  Location: [facility];  Service: Cardiovascular;  Laterality: N/A;  ? CARPAL  TUNNEL RELEASE Left   ? CHOLECYSTECTOMY    ? COLONOSCOPY  06/27/2005  ? COLONOSCOPY  10/03/2014  ? CORONARY STENT INTERVENTION N/A 04/15/2021  ? Procedure: CORONARY STENT INTERVENTION;  Surgeon: Lorretta Harp, MD;  Location: [facility];  Service: Cardiovascular;  Laterality: N/A;  ? ESOPHAGEAL DILATION  05/27/2021  ? Procedure: ESOPHAGEAL DILATION;  Surgeon: Rush Landmark Telford Nab., MD;  Location: [facility];  Service: Gastroenterology;;  ? ESOPHAGEAL STENT PLACEMENT N/A 05/27/2021  ? Procedure: ESOPHAGEAL STENT PLACEMENT;  Surgeon: Rush Landmark Telford Nab., MD;  Location: [facility];  Service: Gastroenterology;  Laterality: N/A;  ? ESOPHAGOGASTRODUODENOSCOPY  06/27/2005  ? ESOPHAGOGASTRODUODENOSCOPY N/A 03/19/2021  ? Procedure: ESOPHAGOGASTRODUODENOSCOPY (EGD);  Surgeon: Kieth Brightly, Arta Bruce, MD;  Location: [facility];  Service: General;  Laterality: N/A;  ? ESOPHAGOGASTRODUODENOSCOPY N/A 08/16/2021  ? Procedure: ESOPHAGOGASTRODUODENOSCOPY (EGD);  Surgeon: Irving Copas., MD;  Location: [facility];  Service: Gastroenterology;  Laterality: N/A;  ? ESOPHAGOGASTRODUODENOSCOPY (EGD) WITH PROPOFOL N/A 05/27/2021  ? Procedure: ESOPHAGOGASTRODUODENOSCOPY (EGD) WITH PROPOFOL;  Surgeon: Rush Landmark Telford Nab., MD;  Location: [facility];  Service: Gastroenterology;  Laterality: N/A;  ? ESOPHAGOGASTRODUODENOSCOPY (EGD) WITH PROPOFOL N/A 07/08/2021  ? Procedure: ESOPHAGOGASTRODUODENOSCOPY (EGD) WITH PROPOFOL;  Surgeon: Rush Landmark Telford Nab., MD;  Location: [facility];  Service: Gastroenterology;  Laterality: N/A;  fluoro  ? ESOPHAGOGASTRODUODENOSCOPY (EGD) WITH PROPOFOL N/A 08/14/2021  ? Procedure: ESOPHAGOGASTRODUODENOSCOPY (EGD) WITH PROPOFOL;  Surgeon: Rush Landmark Telford Nab., MD;  Location: [facility];  Service: Gastroenterology;  Laterality: N/A;  fluoro Axios stent (20 mm)  ? ESOPHAGOGASTRODUODENOSCOPY (EGD) WITH PROPOFOL N/A 10/16/2021  ? Procedure: ESOPHAGOGASTRODUODENOSCOPY (EGD) WITH PROPOFOL;  Surgeon: Rush Landmark Telford Nab., MD;  Location: [facility];  Service: Gastroenterology;  Laterality: N/A;  axios stent pull fluoro  ? FLEXIBLE SIGMOIDOSCOPY N/A 08/16/2021  ? Procedure: FLEXIBLE SIGMOIDOSCOPY;  Surgeon: Irving Copas., MD;  Location: [facility];  Service: Gastroenterology;  Laterality: N/A;  ? GASTRIC ROUX-EN-Y N/A 02/18/2021  ? Procedure: LAPAROSCOPIC ROUX-EN-Y GASTRIC BYPASS WITH UPPER ENDOSCOPY,;  Surgeon: Kinsinger, Arta Bruce, MD;  Location: [facility];  Service: General;  Laterality: N/A;  ? HEMORRHOID SURGERY  08/25/1997  ? IR GJ TUBE CHANGE   03/27/2021  ? LAPAROSCOPIC INSERTION GASTROSTOMY TUBE N/A 03/20/2021  ? Procedure: LAPAROSCOPIC INSERTION GASTROSTOMY TUBE;  Surgeon: Kinsinger, Arta Bruce, MD;  Location: [facility];  Service: General;  Laterality: N/A;  ? LEFT HEART CATH AND CORONARY ANGIOGRAPHY N/A 04/15/2021  ? Procedure: LEFT HEART CATH AND CORONARY ANGIOGRAPHY;  Surgeon: Lorretta Harp, MD;  Location: [facility];  Service: Cardiovascular;  Laterality: N/A;  ? MOUTH SURGERY    ? removed area which was benign  ? STENT REMOVAL  07/08/2021  ? Procedure: STENT REMOVAL;  Surgeon: Irving Copas., MD;  Location: [facility];  Service: Gastroenterology;;  ? STENT REMOVAL  10/16/2021  ? Procedure: STENT REMOVAL;  Surgeon: Irving Copas., MD;  Location: [facility];  Service: Gastroenterology;;  ? TONSILLECTOMY    ? UPPER GI ENDOSCOPY N/A 03/20/2021  ? Procedure: UPPER GI ENDOSCOPY;  Surgeon: Kinsinger, Arta Bruce, MD;  Location: [facility];  Service: General;  Laterality: N/A;  ? ?No current facility-administered medications for this encounter.  ? ?No current facility-administered medications for this encounter. ?Allergies  ?Allergen Reactions  ? Other Other (See Comments)  ?  Cats  ? ?Family History  ?Problem Relation Age of Onset  ? Hypertension Mother   ? Diabetes Mother   ? Heart attack Father 39  ? CAD Father   ?  Hypertension Sister   ? Diabetes Other   ? Hypertension Other   ? Heart disease Other   ? Colon cancer Neg Hx   ? Esophageal cancer Neg Hx   ? Pancreatic cancer Neg Hx   ? Stomach cancer Neg Hx   ? Liver disease Neg Hx   ? Inflammatory bowel disease Neg Hx   ? Rectal cancer Neg Hx   ? ?Social History  ? ?Socioeconomic History  ? Marital status: Married  ?  Spouse name: Not on file  ? Number of children: Not on file  ? Years of education: Not on file  ? Highest education level: Not on file  ?Occupational History  ? Not on file  ?Tobacco Use  ? Smoking status: Former  ?  Packs/day: 1.00  ?  Years: 15.00  ?  Pack years: 15.00   ?  Types: Cigarettes  ?  Quit date: 08/05/1989  ?  Years since quitting: 32.3  ? Smokeless tobacco: Never  ?Vaping Use  ? Vaping Use: Never used  ?Substance and Sexual Activity  ? Alcohol use: Never  ? Dr

## 2021-11-18 NOTE — Transfer of Care (Signed)
Immediate Anesthesia Transfer of Care Note ? ?Patient: HARDY HARCUM ? ?Procedure(s) Performed: ESOPHAGOGASTRODUODENOSCOPY (EGD) WITH PROPOFOL ?SUBMUCOSAL INJECTION ? ?Patient Location: PACU ? ?Anesthesia Type:MAC ? ?Level of Consciousness: awake, alert  and oriented ? ?Airway & Oxygen Therapy: Patient Spontanous Breathing and Patient connected to face mask oxygen ? ?Post-op Assessment: Report given to RN, Post -op Vital signs reviewed and stable and Patient moving all extremities X 4 ? ?Post vital signs: Reviewed and stable ? ?Last Vitals:  ?Vitals Value Taken Time  ?BP 116/65   ?Temp    ?Pulse 57   ?Resp 19 11/18/21 1058  ?SpO2 100   ?Vitals shown include unvalidated device data. ? ?Last Pain:  ?Vitals:  ? 11/18/21 1021  ?TempSrc: Oral  ?PainSc: 0-No pain  ?   ? ?  ? ?Complications: No notable events documented. ?

## 2021-11-18 NOTE — Telephone Encounter (Signed)
The pt contacted the office by My Chart.  See notes  ?

## 2021-11-18 NOTE — Anesthesia Postprocedure Evaluation (Signed)
Anesthesia Post Note ? ?Patient: Hector Curry ? ?Procedure(s) Performed: ESOPHAGOGASTRODUODENOSCOPY (EGD) WITH PROPOFOL ?SUBMUCOSAL INJECTION ?DUODENAL STENT PLACEMENT ? ?  ? ?Patient location during evaluation: Endoscopy ?Anesthesia Type: MAC ?Level of consciousness: awake and alert, oriented and patient cooperative ?Pain management: pain level controlled ?Vital Signs Assessment: post-procedure vital signs reviewed and stable ?Respiratory status: spontaneous breathing, nonlabored ventilation and respiratory function stable ?Cardiovascular status: blood pressure returned to baseline and stable ?Postop Assessment: no apparent nausea or vomiting and able to ambulate ?Anesthetic complications: no ? ? ?No notable events documented. ? ?Last Vitals:  ?Vitals:  ? 11/18/21 1120 11/18/21 1130  ?BP: 122/72 139/81  ?Pulse: (!) 52 (!) 57  ?Resp: 15 17  ?Temp:    ?SpO2: 95% 96%  ?  ?Last Pain:  ?Vitals:  ? 11/18/21 1130  ?TempSrc:   ?PainSc: 0-No pain  ? ? ?  ?  ?  ?  ?  ?  ? ?[name],E. Rosetta Rupnow ? ? ? ? ?

## 2021-11-18 NOTE — Op Note (Signed)
[facility] ?Patient Name: Hector Curry ?Procedure Date: 11/18/2021 ?MRN: [id number] ?Attending MD: Justice Britain , MD ?Date of Birth: [birth year] ?CSN: [id number] ?Age: 61 ?Admit Type: Outpatient ?Procedure:                Upper GI endoscopy ?Indications:              Post-bariatric anastomotic stenosis, Follow-up of  ?                          post-bariatric anastomotic stenosis, For therapy of  ?                          post-bariatric anastomotic stenosis ?Providers:                Justice Britain, MD, Carlyn Reichert, RN, Cindee Salt  ?                          Teaching laboratory technician ?Referring MD:             Arta Bruce Kinsinger MD, MD, Leonie Douglas. Doy Hutching, MD ?Medicines:                Monitored Anesthesia Care ?Complications:            No immediate complications. ?Estimated Blood Loss:     Estimated blood loss was minimal. ?Procedure:                Pre-Anesthesia Assessment: ?                          - Prior to the procedure, a History and Physical  ?                          was performed, and patient medications and  ?                          allergies were reviewed. The patient's tolerance of  ?                          previous anesthesia was also reviewed. The risks  ?                          and benefits of the procedure and the sedation  ?                          options and risks were discussed with the patient.  ?                          All questions were answered, and informed consent  ?                          was obtained. Prior Anticoagulants: The patient has  ?                          taken Plavix (clopidogrel), last dose was 5 days  ?  prior to procedure. ASA Grade Assessment: III - A  ?                          patient with severe systemic disease. After  ?                          reviewing the risks and benefits, the patient was  ?                          deemed in satisfactory condition to undergo the  ?                          procedure. ?                           After obtaining informed consent, the endoscope was  ?                          passed under direct vision. Throughout the  ?                          procedure, the patient's blood pressure, pulse, and  ?                          oxygen saturations were monitored continuously. The  ?                          [id number] ([id number]) Olympus therapeutic endoscope  ?                          was introduced through the mouth, and advanced to  ?                          the jejunum. The upper GI endoscopy was  ?                          accomplished without difficulty. The patient  ?                          tolerated the procedure. ?Scope In: ?Scope Out: ?Findings: ?     No gross lesions were noted in the entire esophagus. ?     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  ?     This was characterized by stenosis 0.6 cm (inner diameter) x ~1 cm  ?     (length). There was evidence of 2 staples proximal to the stenosis and  ?     removal was accomplished with a regular forceps. Looking through the  ?     stenosis, I could see another set of staples, but these could not be  ?     grasped as they were within the stenosis itself. I wonder, if these  ?     staples are causing an inflammatory reaction that makes this area  ?     continue to stenose. These will need to be removed at some point in  ?  effort of helping decrease recurrence. I decided to proceed with steroid  ?     injection to try and decrease recurrence. The area was successfully  ?     injected with a total of 4 mL of triamcinolone (10 mg/mL) for to  ?     decrease stenosis recurrence. Then I proceeded was stented with an AXIOS  ?     20 mm x 10 mm LAMS. Decided to not dilate the stent, because last time I  ?     dilated the stent, patient ended up having melena/anemia and  ?     hospitalization. This may have been due to patient being on active  ?     anticoagulation at the time, but deferred, because the stent  was  ?     self-expanding already. ?Impression:               - No gross lesions in esophagus. ?                          - A gastric pouch and gastrojejunostomy anastomosis  ?                          was found. Staples proxiaml to the anastomosis were  ?                          removed. The anastomosis was strictured again  ?                          unfortunately. Staples noted within the anastomosis  ?                          could not be removed as they were beyond the scope  ?                          acquisition area. Steroids injected to stenosis  ?                          then AXIOS 20 mm LAMS placed. ?Moderate Sedation: ?     Not Applicable - Patient had care per Anesthesia. ?Recommendation:           - The patient will be observed post-procedure,  ?                          until all discharge criteria are met. ?                          - Discharge patient to home. ?                          - Patient has a contact number available for  ?                          emergencies. The signs and symptoms of potential  ?                          delayed complications were discussed with the  ?  patient. Return to normal activities tomorrow.  ?                          Written discharge instructions were provided to the  ?                          patient. ?                          - Clear liquid diet today. Full liquid diet x  ?                          2-days then advance to low-residue diet. ?                          - May restart Plavix on 3/29. ?                          - Continue present medications. ?                          - May continue Aspirin until Plavix is restarted  ?                          per Cardiology anti-PLT notation. ?                          - Observe patient's clinical course. ?                          - Remove AXIOS in 6-weeks. Plan to repeat steroid  ?                          injection. If able to find and remove the staples  ?                           that were seen just distal to the narrowing,  ?                          hopefully that may help with this anastomosis  ?                          stricture from not recurring. ?                          - PPI twice daily. ?                          - Carafate twice daily - do not stop this  ?                          medication and if you run out let us know to get  ?                          you refills. ?                          -  Hold Plavix for 5-days prior to stent removal. ?                          - The findings and recommendations were discussed  ?                          with the patient. ?                          - The findings and recommendations were discussed  ?                          with the patient's family. ?Procedure Code(s):        --- Professional --- ?                          [phone], Esophagogastroduodenoscopy, flexible,  ?                          transoral; with placement of endoscopic stent  ?                          (includes pre- and post-dilation and guide wire  ?                          passage, when performed) ?                          [phone], Esophagogastroduodenoscopy, flexible,  ?                          transoral; with removal of foreign body(s) ?Diagnosis Code(s):        --- Professional --- ?                          Z98.0, Intestinal bypass and anastomosis status ?                          Q76.22, Other complications of other bariatric  ?                          procedure ?CPT copyright 2019 [employer]. All rights reserved. ?The codes documented in this report are preliminary and upon coder review may  ?be revised to meet current compliance requirements. ?Justice Britain, MD ?11/18/2021 11:14:28 AM ?Number of Addenda: 0 ?

## 2021-11-18 NOTE — Discharge Instructions (Signed)
YOU HAD AN ENDOSCOPIC PROCEDURE TODAY: Refer to the procedure report and other information in the discharge instructions given to you for any specific questions about what was found during the examination. If this information does not answer your questions, please call [place] office at [phone] to clarify.  ° °YOU SHOULD EXPECT: Some feelings of bloating in the abdomen. Passage of more gas than usual. Walking can help get rid of the air that was put into your GI tract during the procedure and reduce the bloating. If you had a lower endoscopy (such as a colonoscopy or flexible sigmoidoscopy) you may notice spotting of blood in your stool or on the toilet paper. Some abdominal soreness may be present for a day or two, also. ° °DIET: Your first meal following the procedure should be a light meal and then it is ok to progress to your normal diet. A half-sandwich or bowl of soup is an example of a good first meal. Heavy or fried foods are harder to digest and may make you feel nauseous or bloated. Drink plenty of fluids but you should avoid alcoholic beverages for 24 hours. If you had a esophageal dilation, please see attached instructions for diet.   ° °ACTIVITY: Your care partner should take you home directly after the procedure. You should plan to take it easy, moving slowly for the rest of the day. You can resume normal activity the day after the procedure however YOU SHOULD NOT DRIVE, use power tools, machinery or perform tasks that involve climbing or major physical exertion for 24 hours (because of the sedation medicines used during the test).  ° °SYMPTOMS TO REPORT IMMEDIATELY: °A gastroenterologist can be reached at any hour. Please call [phone]  for any of the following symptoms:  °Following lower endoscopy (colonoscopy, flexible sigmoidoscopy) °Excessive amounts of blood in the stool  °Significant tenderness, worsening of abdominal pains  °Swelling of the abdomen that is new, acute  °Fever of 100° or  higher  °Following upper endoscopy (EGD, EUS, ERCP, esophageal dilation) °Vomiting of blood or coffee ground material  °New, significant abdominal pain  °New, significant chest pain or pain under the shoulder blades  °Painful or persistently difficult swallowing  °New shortness of breath  °Black, tarry-looking or red, bloody stools ° °FOLLOW UP:  °If any biopsies were taken you will be contacted by phone or by letter within the next 1-3 weeks. Call [phone]  if you have not heard about the biopsies in 3 weeks.  °Please also call with any specific questions about appointments or follow up tests. ° °

## 2021-11-18 NOTE — Anesthesia Preprocedure Evaluation (Addendum)
Anesthesia Evaluation  ?Patient identified by MRN, date of birth, ID band ?Patient awake ? ? ? ?Reviewed: ?Allergy & Precautions, NPO status , Patient's Chart, lab work & pertinent test results, reviewed documented beta blocker date and time  ? ?History of Anesthesia Complications ?Negative for: history of anesthetic complications ? ?Airway ?Mallampati: II ? ?TM Distance: >3 FB ?Neck ROM: Full ? ? ? Dental ? ?(+) Dental Advisory Given ?  ?Pulmonary ?sleep apnea (BiPAP) and Continuous Positive Airway Pressure Ventilation , former smoker,  ?  ?breath sounds clear to auscultation ? ? ? ? ? ? Cardiovascular ?hypertension, Pt. on medications and Pt. on home beta blockers ?(-) angina+ CAD, + Past MI and + Cardiac Stents  ?+ dysrhythmias Supra Ventricular Tachycardia  ?Rhythm:Regular Rate:Normal ? ?'22 Cath:  ??  Prox LAD lesion is 80% stenosed. ??  1st Diag lesion is 90% stenosed. ??  A drug-eluting stent was successfully placed. ??  Post intervention, there is a 0% residual stenosis ? ?'22 ECHO: ?EF 50-55%. The LV has low normal function, Apical hypokinesis, moderate asymmetric left ventricular  ?hypertrophy of the basal-septal segment, no significant valvular abnormalities ?  ?Neuro/Psych ?negative neurological ROS ?   ? GI/Hepatic ?Neg liver ROS, GERD  Medicated and Controlled,S/p lap band ?  ?Endo/Other  ?diabetes (no longer requires insulin) ? Renal/GU ?stones  ? ?  ?Musculoskeletal ? ? Abdominal ?(+) + obese,   ?Peds ? Hematology ?negative hematology ROS ?(+)   ?Anesthesia Other Findings ? ? Reproductive/Obstetrics ? ?  ? ? ? ? ? ? ? ? ? ? ? ? ? ?  ?  ? ? ? ? ? ? ? ?Anesthesia Physical ?Anesthesia Plan ? ?ASA: 3 ? ?Anesthesia Plan: MAC  ? ?Post-op Pain Management: Minimal or no pain anticipated  ? ?Induction:  ? ?PONV Risk Score and Plan: 1 and Ondansetron and Treatment may vary due to age or medical condition ? ?Airway Management Planned: Natural Airway and Nasal  Cannula ? ?Additional Equipment: None ? ?Intra-op Plan:  ? ?Post-operative Plan:  ? ?Informed Consent: I have reviewed the patients History and Physical, chart, labs and discussed the procedure including the risks, benefits and alternatives for the proposed anesthesia with the patient or authorized representative who has indicated his/her understanding and acceptance.  ? ? ? ?Dental advisory given ? ?Plan Discussed with: CRNA and Surgeon ? ?Anesthesia Plan Comments:   ? ? ? ? ? ?Anesthesia Quick Evaluation ? ?

## 2021-11-18 NOTE — Telephone Encounter (Addendum)
? ?  Patient Name: Hector Curry  ?DOB: [birth year] ?MRN: [id number] ? ?Primary Cardiologist: Julien Nordmann, MD ? ?Chart reviewed as part of pre-operative protocol coverage.  ? ?Per Dr. Windell Hummingbird reply, "Would probably follow same regiment as previously discussed ?if he needs to hold Plavix 5 days before EGD,  ?Would suggest he consider taking aspirin 81 mg daily instead." ? ?Per EMR patient's procedure is scheduled for today so we do not have sufficient lead time to relay this as a pre-procedure recommendation. Per separate phone note, GI team had already reviewed pre-procedure instructions with patient including to hold his Plavix 5 days prior to procedure. We typically advise that blood thinners be resumed when felt safe by performing physician. Will cc this msg to [name] and [name] as FYI only. Please call with questions. Thank you! ? ? ?Laurann Montana, PA-C ?11/18/2021, 9:23 AM ? ? ?

## 2021-11-18 NOTE — Anesthesia Procedure Notes (Signed)
Procedure Name: [place] ?Date/Time: 11/18/2021 10:32 AM ?Performed by: Niel Hummer, CRNA ?Pre-anesthesia Checklist: Patient identified, Emergency Drugs available, Suction available and Patient being monitored ?Oxygen Delivery Method: Simple face mask ? ? ? ? ?

## 2021-11-18 NOTE — Telephone Encounter (Signed)
Thank you for this reply. ?He has held it for 5 days. ?I will have him restart as soon as I feel comfortable based on today's procedure results. ?Thanks. ?GM ?

## 2021-11-19 ENCOUNTER — Other Ambulatory Visit (HOSPITAL_COMMUNITY): Payer: Self-pay

## 2021-11-19 MED ORDER — MOMETASONE FUROATE 0.1 % EX SOLN
CUTANEOUS | 5 refills | Status: DC
  Filled 2021-11-19: qty 60, 30d supply, fill #0

## 2021-11-21 ENCOUNTER — Other Ambulatory Visit (HOSPITAL_COMMUNITY): Payer: Self-pay

## 2021-11-25 ENCOUNTER — Other Ambulatory Visit (HOSPITAL_COMMUNITY): Payer: Self-pay

## 2021-12-02 ENCOUNTER — Encounter: Payer: Self-pay | Admitting: Nurse Practitioner

## 2021-12-02 NOTE — Progress Notes (Signed)
Unable to contact patient to schedule, letter sent. Order cancelled 

## 2021-12-09 ENCOUNTER — Other Ambulatory Visit: Payer: Self-pay

## 2021-12-09 ENCOUNTER — Other Ambulatory Visit (HOSPITAL_COMMUNITY): Payer: Self-pay

## 2021-12-09 ENCOUNTER — Telehealth: Payer: Self-pay

## 2021-12-09 DIAGNOSIS — K9189 Other postprocedural complications and disorders of digestive system: Secondary | ICD-10-CM

## 2021-12-09 NOTE — Telephone Encounter (Signed)
The pt has been advised of the EGD and office visit.  I have also mailed all instructions and sent to My Chart.  The pt was given the instructions for plavix to hold 5 days prior to his procedure. The pt has been advised of the information and verbalized understanding.    ?

## 2021-12-09 NOTE — Telephone Encounter (Signed)
The pt has been scheduled for EGD at [facility] on 01/23/22 at 730 am with GM  ?ROV scheduled with GM at 3:50 pm on 5/23 ?

## 2021-12-09 NOTE — Telephone Encounter (Signed)
-----   Message from Lemar Lofty., MD sent at 12/09/2021  4:46 AM EDT ----- ?Regarding: Followup ?Hector Curry, ?This patient needs clinic visit with me in next 3-4 weeks.  Plan for EGD with stent pull at 8 weeks after his last EGD date.  Thanks. ?GM ?

## 2022-01-07 ENCOUNTER — Telehealth: Payer: Self-pay | Admitting: Cardiovascular Disease

## 2022-01-07 ENCOUNTER — Other Ambulatory Visit: Payer: Self-pay | Admitting: Cardiovascular Disease

## 2022-01-07 ENCOUNTER — Other Ambulatory Visit: Payer: Self-pay

## 2022-01-07 ENCOUNTER — Other Ambulatory Visit (HOSPITAL_COMMUNITY): Payer: Self-pay

## 2022-01-07 MED ORDER — PROPRANOLOL HCL 10 MG PO TABS
ORAL_TABLET | ORAL | 3 refills | Status: DC
  Filled 2022-01-07: qty 60, 10d supply, fill #0
  Filled 2022-01-22: qty 60, 10d supply, fill #1
  Filled 2022-02-11: qty 60, 10d supply, fill #2
  Filled 2022-03-10: qty 60, 10d supply, fill #3

## 2022-01-07 MED ORDER — PROPRANOLOL HCL 10 MG PO TABS
ORAL_TABLET | ORAL | 3 refills | Status: DC
  Filled 2022-01-07: qty 60, 15d supply, fill #0

## 2022-01-07 NOTE — Telephone Encounter (Signed)
?*  STAT* If patient is at the pharmacy, call can be transferred to refill team. ? ? ?1. Which medications need to be refilled? (please list name of each medication and dose if known) propranolol (INDERAL) 10 MG tablet ? ?2. Which pharmacy/location (including street and city if local pharmacy) is medication to be sent to? [facility] ? ?3. Do they need a 30 day or 90 day supply? 90 day ? ?Patient is completely out of medication ?

## 2022-01-07 NOTE — Telephone Encounter (Signed)
propranolol (INDERAL) 10 MG tablet 60 tablet 3 01/07/2022    ?Sig: Take 1 to 2 tablets  by mouth 3 times daily as needed for tachycardia.   ?Sent to pharmacy as: propranolol (INDERAL) 10 MG tablet   ? ?Pharmacy ? ?[facility]  ? ?

## 2022-01-10 ENCOUNTER — Ambulatory Visit: Payer: PRIVATE HEALTH INSURANCE | Attending: Internal Medicine

## 2022-01-10 DIAGNOSIS — G4733 Obstructive sleep apnea (adult) (pediatric): Secondary | ICD-10-CM | POA: Insufficient documentation

## 2022-01-13 ENCOUNTER — Encounter (HOSPITAL_COMMUNITY): Payer: Self-pay | Admitting: Gastroenterology

## 2022-01-14 ENCOUNTER — Other Ambulatory Visit: Payer: Self-pay | Admitting: Gastroenterology

## 2022-01-14 ENCOUNTER — Other Ambulatory Visit (HOSPITAL_COMMUNITY): Payer: Self-pay

## 2022-01-14 ENCOUNTER — Ambulatory Visit (INDEPENDENT_AMBULATORY_CARE_PROVIDER_SITE_OTHER): Payer: No Typology Code available for payment source | Admitting: Gastroenterology

## 2022-01-14 ENCOUNTER — Encounter: Payer: Self-pay | Admitting: Gastroenterology

## 2022-01-14 ENCOUNTER — Other Ambulatory Visit (INDEPENDENT_AMBULATORY_CARE_PROVIDER_SITE_OTHER): Payer: No Typology Code available for payment source

## 2022-01-14 VITALS — BP 108/76 | HR 64 | Ht 74.0 in | Wt 228.2 lb

## 2022-01-14 DIAGNOSIS — Z9884 Bariatric surgery status: Secondary | ICD-10-CM

## 2022-01-14 DIAGNOSIS — K9189 Other postprocedural complications and disorders of digestive system: Secondary | ICD-10-CM

## 2022-01-14 DIAGNOSIS — T189XXD Foreign body of alimentary tract, part unspecified, subsequent encounter: Secondary | ICD-10-CM

## 2022-01-14 DIAGNOSIS — R1310 Dysphagia, unspecified: Secondary | ICD-10-CM

## 2022-01-14 DIAGNOSIS — R1319 Other dysphagia: Secondary | ICD-10-CM | POA: Diagnosis not present

## 2022-01-14 DIAGNOSIS — K311 Adult hypertrophic pyloric stenosis: Secondary | ICD-10-CM | POA: Diagnosis not present

## 2022-01-14 DIAGNOSIS — R198 Other specified symptoms and signs involving the digestive system and abdomen: Secondary | ICD-10-CM | POA: Diagnosis not present

## 2022-01-14 NOTE — Progress Notes (Signed)
[place] VISIT   Primary Care Provider Hector Crouch, MD [place] [address] [phone]  Referring Provider Dr. Kieth Curry   Patient Profile: Hector Curry is a 61 y.o. 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  The patient presents to the [facility] for an evaluation and management of problem(s) noted below:  Problem List 1. Gastrojejunal anastomotic stricture   2. Gastric outlet obstruction   3. Esophageal dysphagia   4. History of Roux-en-Y gastric bypass   5. Foreign body in digestive tract, subsequent encounter   6. Abnormal findings on esophagogastroduodenoscopy (EGD)     History of Present Illness Please see prior progress notes for full details of HPI.  Interval History The patient returns for follow-up.  Unfortunately, since our last clinic visit quite a few months ago, the patient has required additional placement of a 20 mm AXIOS for which she had good effect.  Unfortunately, after removal within 1 month he had recurrent symptoms at which point we had to replace another 20 mm AXIOS as the stenosis had recurred.  On the last endoscopy I was able to remove staples that were noted proximal to the anastomosis though I saw what appeared to be at least 1 if not 2 other staples within the anastomosis that I could not reach as a result of the stricturing.  I hypothesized that these staples could be a source/nidus for persisting inflammation that may be allowing stenosis to recur.  I placed triamcinolone in an effort of trying to decrease risk of recurrence.  The patient  has now had the stent in place for nearly 8 weeks and he has felt well.  He is worried that as soon as we remove the stent, he may have issues occur again and although he does enjoy seeing me he does not want to continue to have as many endoscopies as he is had.  He is also worried about having to undergo another surgical intervention.    GI Review of Systems Positive as above including issues of dysgeusia that have been causing him issues Negative for odynophagia, dysphagia, nausea, vomiting, bloating, alteration of bowel habits, melena, hematochezia   Review of Systems General: Denies fevers/chills Cardiovascular: Denies chest pain Pulmonary: Denies shortness of breath Gastroenterological: See HPI Genitourinary: Denies darkened urine Hematological: Positive for easy bruising/bleeding due to antiplatelet therapy Dermatological: Denies jaundice Psychological: Mood is stable   Medications Current Outpatient Medications  Medication Sig Dispense Refill   atorvastatin (LIPITOR) 10 MG tablet Take 1 tablet (10 mg total) by mouth daily. 90 tablet 2   b complex vitamins capsule Take by mouth daily.     Calcium Citrate-Vitamin D (BL CALCIUM CITRATE + D PO) Take 500 mg by mouth. With 500 IU of vitamin D     clopidogrel (PLAVIX) 75 MG tablet Take 1 tablet (75 mg total) by mouth daily. 90 tablet 1   Continuous Blood Gluc Sensor (FREESTYLE LIBRE 2 SENSOR) MISC Use as directed every 14 days 10 each 1   ferrous sulfate 325 (65 FE) MG tablet Take 1 tablet by mouth 2 times daily with a meal. 600 tablet 0   fluticasone (FLONASE) 50 MCG/ACT nasal spray Place 1 spray into both nostrils daily  as needed for allergies or rhinitis.     metoprolol tartrate (LOPRESSOR) 50 MG tablet Take 1 tablet by mouth 2  times daily. 180 tablet 3   mometasone (ELOCON) 0.1 % lotion Apply to affected areas once daily 60 mL 5   Multiple Vitamin (MULTI-VITAMIN DAILY PO) Take by mouth. Bariartic     Potassium Chloride ER 20 MEQ  TBCR Take 1 tablet by mouth 2 times daily 60 tablet 5   PRESCRIPTION MEDICATION at bedtime. Bipap     Probiotic Product (PROBIOTIC ADVANCED PO) Take 1 capsule by mouth. And PREBIOTIC - bariatric     propranolol (INDERAL) 10 MG tablet Take 1 to 2 tablets  by mouth 3 times daily as needed for tachycardia. (Patient taking differently: Take 1 to 2 tablets  by mouth qhs for tachycardia.) 60 tablet 3   Propylene Glycol (SYSTANE BALANCE OP) Place 1 drop into both eyes daily as needed (dry eyes).     SODIUM FLUORIDE 5000 SENSITIVE 1.1-5 % GEL Place 1 application onto teeth daily.     nitroGLYCERIN (NITROSTAT) 0.4 MG SL tablet Place 1 tablet (0.4 mg total) under the tongue every 5 (five) minutes as needed for chest pain. 25 tablet 3   pantoprazole (PROTONIX) 40 MG tablet Take 1 tablet (40 mg total) by mouth 2 (two) times daily before a meal. (Patient not taking: Reported on 01/14/2022) 60 tablet 12   sucralfate (CARAFATE) 1 g tablet Take 1 tablet by mouth 4 times daily -  with meals and at bedtime.  **Can dissolve tablet in a small amount of water prior to taking** (Patient not taking: Reported on 01/14/2022) 120 tablet 6   No current facility-administered medications for this visit.    Allergies Allergies  Allergen Reactions   Other Other (See Comments)    Cats    Histories Past Medical History:  Diagnosis Date   Allergic rhinitis    Atrial tachycardia (HCC)    CAD (coronary artery disease)    a. 05/08/2015 Cath: no significant CAD, LVEF nl-->Med; b. 07/2017 MV: attenuation artifact, no ischemia, EF 65%-->Low risk; c. 03/2021 NSTEMI/PCI: LM nl, LAD 80p (3.0x18 Onyx Frontier DES), D1 90 (too small for PCI), LCX nl, RCA nl.   Diabetes mellitus without complication ([place])    Diastolic dysfunction    a. 04/2015 Echo: EF 60-65%; b. 05/2016 Echo: EF 60-65%, GrI DD; c. 07/2017 Echo: EF 55-60%, Ao root 77mm; d. 03/2019 Echo: EF 55-60%, Ao root/Asc Ao 16mm; e. 03/2021 Echo: EF 50-55%, apical HK, mod asymm  basal-septal hypertrophy. Nl RV fxn. Asc Ao 85mm.   Dilated aortic root ([place])    a. 07/2017 Echo: 77mm Ao root - mildly dil; b. 03/2019 Echo: Ao root 1mm; c. 03/2021 Echo: Asc Ao 56mm.   Diverticulosis 10/03/2014   Dysrhythmia    Esophageal stricture    a.  In setting of lap band June 2022-stricture at the [place] anastomosis requiring gastrostomy tube; b. 05/2021 s/p esoph stenting (38mm); c. 07/2021 s/p esoph stenting (54mm).   Family history of premature CAD    a. father passed from MI at 91   GERD (gastroesophageal reflux disease)    GIB (gastrointestinal bleeding)    a. 07/2021 following esoph stenting.   Hemorrhoids    a. internal hemorrhoids s/p surgery 1999   History of kidney stones    History of tobacco abuse    Hyperlipidemia    Hyperplastic colon polyp 10/03/2014   a. x 2    Hypertension  Inflammatory arthritis    a. CCP antibodies & x-rays negative. Rheumatoid factor 14, felt to be crystaline over RA or psoriatic   Morbid obesity ([place])    a. s/p LAP-BAND - complicated by esoph stricture.   OSA (obstructive sleep apnea)    a. on CPAP   Osteoarthritis    PSVT (paroxysmal supraventricular tachycardia) (HCC)    a. 48 hr Holter 04/2015: NSR w/ rare PVC, short runs of narrow complex tachycardiac, possible atrial tach, longest run 7 beats, PACs noted (2% of all beats 3600 total) they did not seem to correlate w/ significant arrythmia; b. 08/2017 Event monitor: no significant arrhythmias.   Past Surgical History:  Procedure Laterality Date   BALLOON DILATION N/A 08/14/2021   Procedure: BALLOON DILATION;  Surgeon: Mansouraty, Telford Nab., MD;  Location: [facility];  Service: Gastroenterology;  Laterality: N/A;   BARIATRIC SURGERY  01/2021   lap band    BILIARY STENT PLACEMENT N/A 08/14/2021   Procedure: AXIOS STENT PLACEMENT;  Surgeon: Irving Copas., MD;  Location: [facility];  Service: Gastroenterology;  Laterality: N/A;   BIOPSY  07/08/2021   Procedure: BIOPSY;   Surgeon: Rush Landmark Telford Nab., MD;  Location: [facility];  Service: Gastroenterology;;   BIOPSY  08/14/2021   Procedure: BIOPSY;  Surgeon: Irving Copas., MD;  Location: [facility];  Service: Gastroenterology;;   CARDIAC CATHETERIZATION N/A 05/08/2015   Procedure: Left Heart Cath and Coronary Angiography;  Surgeon: Jettie Booze, MD;  Location: [facility];  Service: Cardiovascular;  Laterality: N/A;   CARPAL TUNNEL RELEASE Left    CHOLECYSTECTOMY     COLONOSCOPY  06/27/2005   COLONOSCOPY  10/03/2014   CORONARY STENT INTERVENTION N/A 04/15/2021   Procedure: CORONARY STENT INTERVENTION;  Surgeon: Lorretta Harp, MD;  Location: [facility];  Service: Cardiovascular;  Laterality: N/A;   DUODENAL STENT PLACEMENT N/A 11/18/2021   Procedure: DUODENAL STENT PLACEMENT;  Surgeon: Rush Landmark Telford Nab., MD;  Location: [facility];  Service: Gastroenterology;  Laterality: N/A;  axios placed at [place] anastamosis   ESOPHAGEAL DILATION  05/27/2021   Procedure: ESOPHAGEAL DILATION;  Surgeon: Rush Landmark, Telford Nab., MD;  Location: [facility];  Service: Gastroenterology;;   ESOPHAGEAL STENT PLACEMENT N/A 05/27/2021   Procedure: ESOPHAGEAL STENT PLACEMENT;  Surgeon: Irving Copas., MD;  Location: [facility];  Service: Gastroenterology;  Laterality: N/A;   ESOPHAGOGASTRODUODENOSCOPY  06/27/2005   ESOPHAGOGASTRODUODENOSCOPY N/A 03/19/2021   Procedure: ESOPHAGOGASTRODUODENOSCOPY (EGD);  Surgeon: Hector Curry, Arta Bruce, MD;  Location: [facility];  Service: General;  Laterality: N/A;   ESOPHAGOGASTRODUODENOSCOPY N/A 08/16/2021   Procedure: ESOPHAGOGASTRODUODENOSCOPY (EGD);  Surgeon: Irving Copas., MD;  Location: [place];  Service: Gastroenterology;  Laterality: N/A;   ESOPHAGOGASTRODUODENOSCOPY (EGD) WITH PROPOFOL N/A 05/27/2021   Procedure: ESOPHAGOGASTRODUODENOSCOPY (EGD) WITH PROPOFOL;  Surgeon: Rush Landmark Telford Nab., MD;  [facility]: [facility];  Service: Gastroenterology;  Laterality: N/A;   ESOPHAGOGASTRODUODENOSCOPY (EGD) WITH PROPOFOL N/A 07/08/2021   Procedure: ESOPHAGOGASTRODUODENOSCOPY (EGD) WITH PROPOFOL;  Surgeon: Rush Landmark Telford Nab., MD;  [facility]: [facility];  Service: Gastroenterology;  Laterality: N/A;  fluoro   ESOPHAGOGASTRODUODENOSCOPY (EGD) WITH PROPOFOL N/A 08/14/2021   Procedure: ESOPHAGOGASTRODUODENOSCOPY (EGD) WITH PROPOFOL;  Surgeon: Rush Landmark Telford Nab., MD;  Location: [facility];  Service: Gastroenterology;  Laterality: N/A;  fluoro Axios stent (20 mm)   ESOPHAGOGASTRODUODENOSCOPY (EGD) WITH PROPOFOL N/A 10/16/2021   Procedure: ESOPHAGOGASTRODUODENOSCOPY (EGD) WITH PROPOFOL;  Surgeon: Rush Landmark Telford Nab., MD;  [facility]: [facility];  Service: Gastroenterology;  Laterality: N/A;  axios stent pull fluoro  ESOPHAGOGASTRODUODENOSCOPY (EGD) WITH PROPOFOL N/A 11/18/2021   Procedure: ESOPHAGOGASTRODUODENOSCOPY (EGD) WITH PROPOFOL;  Surgeon: Rush Landmark Telford Nab., MD;  [facility]: [facility];  Service: Gastroenterology;  Laterality: N/A;   FLEXIBLE SIGMOIDOSCOPY N/A 08/16/2021   Procedure: FLEXIBLE SIGMOIDOSCOPY;  Surgeon: Rush Landmark Telford Nab., MD;  Location: [facility];  Service: Gastroenterology;  Laterality: N/A;   GASTRIC ROUX-EN-Y N/A 02/18/2021   Procedure: LAPAROSCOPIC ROUX-EN-Y GASTRIC BYPASS WITH UPPER ENDOSCOPY,;  Surgeon: Hector Curry Arta Bruce, MD;  Location: [facility];  Service: General;  Laterality: N/A;   HEMORRHOID SURGERY  08/25/1997   IR [place] TUBE CHANGE  03/27/2021   LAPAROSCOPIC INSERTION GASTROSTOMY TUBE N/A 03/20/2021   Procedure: LAPAROSCOPIC INSERTION GASTROSTOMY TUBE;  Surgeon: Mickeal Skinner, MD;  Location: [facility];  Service: General;  Laterality: N/A;   LEFT HEART CATH AND CORONARY ANGIOGRAPHY N/A 04/15/2021   Procedure: LEFT HEART CATH AND CORONARY ANGIOGRAPHY;  Surgeon: Lorretta Harp, MD;  Location: [place] [facility];  Service: Cardiovascular;  Laterality:  N/A;   MOUTH SURGERY     removed area which was benign   STENT REMOVAL  07/08/2021   Procedure: STENT REMOVAL;  Surgeon: Irving Copas., MD;  Location: [facility];  Service: Gastroenterology;;   Lavell Islam REMOVAL  10/16/2021   Procedure: STENT REMOVAL;  Surgeon: Irving Copas., MD;  Location: [facility];  Service: Gastroenterology;;   Lia [place] INJECTION  11/18/2021   Procedure: SUBMUCOSAL INJECTION;  Surgeon: Irving Copas., MD;  Location: [facility];  Service: Gastroenterology;;   TONSILLECTOMY     UPPER GI ENDOSCOPY N/A 03/20/2021   Procedure: UPPER GI ENDOSCOPY;  Surgeon: Hector Curry, Arta Bruce, MD;  Location: [facility];  Service: General;  Laterality: N/A;   Social History   Socioeconomic History   Marital status: Married    Spouse name: Not on file   Number of children: Not on file   Years of education: Not on file   Highest education level: Not on file  Occupational History   Not on file  Tobacco Use   Smoking status: Former    Packs/day: 1.00    Years: 15.00    Pack years: 15.00    Types: Cigarettes    Quit date: 08/05/1989    Years since quitting: 32.4   Smokeless tobacco: Never  Vaping Use   Vaping Use: Never used  Substance and Sexual Activity   Alcohol use: Yes    Comment: occ   Drug use: Never   Sexual activity: Not on file  Other Topics Concern   Not on file  Social History Narrative   Not on file   Social Determinants of Health   Financial Resource Strain: Not on file  Food Insecurity: Not on file  Transportation Needs: Not on file  Physical Activity: Not on file  Stress: Not on file  Social Connections: Not on file  Intimate Partner Violence: Not on file   Family History  Problem Relation Age of Onset   Hypertension Mother    Diabetes Mother    Heart attack Father 68   CAD Father    Hypertension Sister    Diabetes Other    Hypertension Other    Heart disease Other    Colon cancer Neg Hx    Esophageal cancer Neg  Hx    Pancreatic cancer Neg Hx    Stomach cancer Neg Hx    Liver disease Neg Hx    Inflammatory bowel disease Neg Hx    Rectal cancer Neg Hx    I  have reviewed his medical, social, and family history in detail and updated the electronic medical record as necessary.    PHYSICAL EXAMINATION  BP 108/76   Pulse 64   Ht 6\' 2"  (1.88 m)   Wt 228 lb 3.2 oz (103.5 kg)   SpO2 97%   BMI 29.30 kg/m  Wt Readings from Last 3 Encounters:  01/14/22 228 lb 3.2 oz (103.5 kg)  11/18/21 227 lb (103 kg)  10/16/21 233 lb (105.7 kg)  GEN: NAD, appears stated age, doesn't appear chronically ill, accompanied by wife and grandsons PSYCH: Cooperative, without pressured speech EYE: Conjunctivae pink, sclerae anicteric ENT: MMM CV: Nontachycardic RESP: No audible wheezing GI: NABS, soft, protuberant abdomen, surgical scars present, without rebound MSK/EXT: Lower extremity edema present SKIN: No jaundice NEURO:  Alert & Oriented x 3, no focal deficits   REVIEW OF DATA  I reviewed the following data at the time of this encounter:  GI Procedures and Studies  March 2023 EGD - No gross lesions in esophagus. - A gastric pouch and gastrojejunostomy anastomosis was found. Staples proximal to the anastomosis were removed. The anastomosis was strictured again unfortunately. Staples noted within the anastomosis could not be removed as they were beyond the scope acquisition area. Steroids injected to stenosis then AXIOS 20 mm LAMS placed.  Laboratory Studies  Reviewed those in epic   Imaging Studies  No new imaging studies to review   ASSESSMENT  Hector Curry is a 61 y.o. 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  The patient is seen today for evaluation and management of:  1. Gastrojejunal anastomotic stricture   2. Gastric outlet obstruction   3. Esophageal dysphagia   4. History of Roux-en-Y gastric bypass   5. Foreign body in digestive tract, subsequent encounter   6. Abnormal findings on esophagogastroduodenoscopy (EGD)    The patient is hemodynamically and clinically stable at this time.  Unfortunately, he has required 3 AXIOS stenting' to his gastrojejunal anastomosis stricture.  We have kept this AXIOS in for 2 months and we will be removing it next week.  I do have some concern as to our ability to maintain his patency GIST via endoscopy.  If the patient were to have recurrent issues, it is possible he may need an endoscopic stricturotomy.  Time will tell.  We will plan to remove his AXIOS at his upcoming visit, and also plan to try to remove the remaining staples and see if that helps with the recurrent inflammation.  I will also plan to inject further triamcinolone in an effort of trying to decrease risk of recurrence.  I may also go ahead and place a balloon dilator and see if that will help maintain things as well.  Time will tell.  The risks and benefits of endoscopic evaluation were discussed with the patient; these include but are not limited to the risk of perforation, infection, bleeding, missed lesions, lack of diagnosis, severe illness requiring hospitalization, as well as anesthesia and sedation related illnesses.  The patient and/or family is agreeable to proceed.  The patient has been experiencing significant dysgeusia and has not had his vitamins checked after his Roux-en-Y bypass.  I will proceed with some vitamin/micronutrient evaluation in the setting of his known Roux-en-Y gastric bypass to  see if anything is off and may be causing some symptoms.  All patient questions were answered to the best of my ability, and the patient agrees to the aforementioned plan of  action with follow-up as indicated.   PLAN  Proceed with scheduled EGD with fluoroscopy for stent removal and steroid injection and attempt at staple removal next week Laboratories as outlined below to evaluate for vitamin/micronutrient deficiencies in setting of Roux-en-Y gastric bypass Colon cancer screening still due in 2026 for now unless we find evidence of iron deficiency   Orders Placed This Encounter  Procedures   Calcium, ionized   IBC + Ferritin   B12   Folate   INR/PT   Vitamin A   Vitamin D 1,25 dihydroxy   Zinc   Copper, Blood   Selenium serum   Vitamin B1    New Prescriptions   No medications on file   Modified Medications   No medications on file    Planned Follow Up No follow-ups on file.    Total Time in Face-to-Face and in Coordination of Care for patient including independent/personal interpretation/review of prior testing, medical history, examination, medication adjustment, communicating results with the patient directly, and documentation with the EHR is 25 minutes.  Justice Britain, MD [facility] Office # [phone]

## 2022-01-14 NOTE — Progress Notes (Signed)
Attempted to obtain medical history via telephone, unable to reach at this time. HIPAA compliant voicemail message left requesting return call to pre surgical testing department. 

## 2022-01-14 NOTE — H&P (View-Only) (Signed)
[place] VISIT   Primary Care Provider [name], MD [place] [address] [phone]  Referring Provider Dr. Kieth Brightly   Patient Profile: Hector Curry is a 61 y.o. 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  The patient presents to the [facility] for an evaluation and management of problem(s) noted below:  Problem List 1. Gastrojejunal anastomotic stricture   2. Gastric outlet obstruction   3. Esophageal dysphagia   4. History of Roux-en-Y gastric bypass   5. Foreign body in digestive tract, subsequent encounter   6. Abnormal findings on esophagogastroduodenoscopy (EGD)     History of Present Illness Please see prior progress notes for full details of HPI.  Interval History The patient returns for follow-up.  Unfortunately, since our last clinic visit quite a few months ago, the patient has required additional placement of a 20 mm AXIOS for which she had good effect.  Unfortunately, after removal within 1 month he had recurrent symptoms at which point we had to replace another 20 mm AXIOS as the stenosis had recurred.  On the last endoscopy I was able to remove staples that were noted proximal to the anastomosis though I saw what appeared to be at least 1 if not 2 other staples within the anastomosis that I could not reach as a result of the stricturing.  I hypothesized that these staples could be a source/nidus for persisting inflammation that may be allowing stenosis to recur.  I placed triamcinolone in an effort of trying to decrease risk of recurrence.  The patient  has now had the stent in place for nearly 8 weeks and he has felt well.  He is worried that as soon as we remove the stent, he may have issues occur again and although he does enjoy seeing me he does not want to continue to have as many endoscopies as he is had.  He is also worried about having to undergo another surgical intervention.    GI Review of Systems Positive as above including issues of dysgeusia that have been causing him issues Negative for odynophagia, dysphagia, nausea, vomiting, bloating, alteration of bowel habits, melena, hematochezia   Review of Systems General: Denies fevers/chills Cardiovascular: Denies chest pain Pulmonary: Denies shortness of breath Gastroenterological: See HPI Genitourinary: Denies darkened urine Hematological: Positive for easy bruising/bleeding due to antiplatelet therapy Dermatological: Denies jaundice Psychological: Mood is stable   Medications Current Outpatient Medications  Medication Sig Dispense Refill   atorvastatin (LIPITOR) 10 MG tablet Take 1 tablet (10 mg total) by mouth daily. 90 tablet 2   b complex vitamins capsule Take by mouth daily.     Calcium Citrate-Vitamin D (BL CALCIUM CITRATE + D PO) Take 500 mg by mouth. With 500 IU of vitamin D     clopidogrel (PLAVIX) 75 MG tablet Take 1 tablet (75 mg total) by mouth daily. 90 tablet 1   Continuous Blood Gluc Sensor (FREESTYLE LIBRE 2 SENSOR) MISC Use as directed every 14 days 10 each 1   ferrous sulfate 325 (65 FE) MG tablet Take 1 tablet by mouth 2 times daily with a meal. 600 tablet 0   fluticasone (FLONASE) 50 MCG/ACT nasal spray Place 1 spray into both nostrils daily  as needed for allergies or rhinitis.     metoprolol tartrate (LOPRESSOR) 50 MG tablet Take 1 tablet by mouth 2  times daily. 180 tablet 3   mometasone (ELOCON) 0.1 % lotion Apply to affected areas once daily 60 mL 5   Multiple Vitamin (MULTI-VITAMIN DAILY PO) Take by mouth. Bariartic     Potassium Chloride ER 20 MEQ  TBCR Take 1 tablet by mouth 2 times daily 60 tablet 5   PRESCRIPTION MEDICATION at bedtime. Bipap     Probiotic Product (PROBIOTIC ADVANCED PO) Take 1 capsule by mouth. And PREBIOTIC - bariatric     propranolol (INDERAL) 10 MG tablet Take 1 to 2 tablets  by mouth 3 times daily as needed for tachycardia. (Patient taking differently: Take 1 to 2 tablets  by mouth qhs for tachycardia.) 60 tablet 3   Propylene Glycol (SYSTANE BALANCE OP) Place 1 drop into both eyes daily as needed (dry eyes).     SODIUM FLUORIDE 5000 SENSITIVE 1.1-5 % GEL Place 1 application onto teeth daily.     nitroGLYCERIN (NITROSTAT) 0.4 MG SL tablet Place 1 tablet (0.4 mg total) under the tongue every 5 (five) minutes as needed for chest pain. 25 tablet 3   pantoprazole (PROTONIX) 40 MG tablet Take 1 tablet (40 mg total) by mouth 2 (two) times daily before a meal. (Patient not taking: Reported on 01/14/2022) 60 tablet 12   sucralfate (CARAFATE) 1 g tablet Take 1 tablet by mouth 4 times daily -  with meals and at bedtime.  **Can dissolve tablet in a small amount of water prior to taking** (Patient not taking: Reported on 01/14/2022) 120 tablet 6   No current facility-administered medications for this visit.    Allergies Allergies  Allergen Reactions   Other Other (See Comments)    Cats    Histories Past Medical History:  Diagnosis Date   Allergic rhinitis    Atrial tachycardia (HCC)    CAD (coronary artery disease)    a. 05/08/2015 Cath: no significant CAD, LVEF nl-->Med; b. 07/2017 MV: attenuation artifact, no ischemia, EF 65%-->Low risk; c. 03/2021 NSTEMI/PCI: LM nl, LAD 80p (3.0x18 Onyx Frontier DES), D1 90 (too small for PCI), LCX nl, RCA nl.   Diabetes mellitus without complication ([place])    Diastolic dysfunction    a. 04/2015 Echo: EF 60-65%; b. 05/2016 Echo: EF 60-65%, GrI DD; c. 07/2017 Echo: EF 55-60%, Ao root 16mm; d. 03/2019 Echo: EF 55-60%, Ao root/Asc Ao 50mm; e. 03/2021 Echo: EF 50-55%, apical HK, mod asymm  basal-septal hypertrophy. Nl RV fxn. Asc Ao 10mm.   Dilated aortic root ([place])    a. 07/2017 Echo: 72mm Ao root - mildly dil; b. 03/2019 Echo: Ao root 29mm; c. 03/2021 Echo: Asc Ao 64mm.   Diverticulosis 10/03/2014   Dysrhythmia    Esophageal stricture    a.  In setting of lap band June 2022-stricture at the [place] anastomosis requiring gastrostomy tube; b. 05/2021 s/p esoph stenting (32mm); c. 07/2021 s/p esoph stenting (58mm).   Family history of premature CAD    a. father passed from MI at 15   GERD (gastroesophageal reflux disease)    GIB (gastrointestinal bleeding)    a. 07/2021 following esoph stenting.   Hemorrhoids    a. internal hemorrhoids s/p surgery 1999   History of kidney stones    History of tobacco abuse    Hyperlipidemia    Hyperplastic colon polyp 10/03/2014   a. x 2    Hypertension  Inflammatory arthritis    a. CCP antibodies & x-rays negative. Rheumatoid factor 14, felt to be crystaline over RA or psoriatic   Morbid obesity ([place])    a. s/p LAP-BAND - complicated by esoph stricture.   OSA (obstructive sleep apnea)    a. on CPAP   Osteoarthritis    PSVT (paroxysmal supraventricular tachycardia) (HCC)    a. 48 hr Holter 04/2015: NSR w/ rare PVC, short runs of narrow complex tachycardiac, possible atrial tach, longest run 7 beats, PACs noted (2% of all beats 3600 total) they did not seem to correlate w/ significant arrythmia; b. 08/2017 Event monitor: no significant arrhythmias.   Past Surgical History:  Procedure Laterality Date   BALLOON DILATION N/A 08/14/2021   Procedure: BALLOON DILATION;  Surgeon: Mansouraty, Telford Nab., MD;  Location: [facility];  Service: Gastroenterology;  Laterality: N/A;   BARIATRIC SURGERY  01/2021   lap band    BILIARY STENT PLACEMENT N/A 08/14/2021   Procedure: AXIOS STENT PLACEMENT;  Surgeon: Irving Copas., MD;  Location: [facility];  Service: Gastroenterology;  Laterality: N/A;   BIOPSY  07/08/2021   Procedure: BIOPSY;   Surgeon: Rush Landmark Telford Nab., MD;  Location: [facility];  Service: Gastroenterology;;   BIOPSY  08/14/2021   Procedure: BIOPSY;  Surgeon: Irving Copas., MD;  Location: [facility];  Service: Gastroenterology;;   CARDIAC CATHETERIZATION N/A 05/08/2015   Procedure: Left Heart Cath and Coronary Angiography;  Surgeon: Jettie Booze, MD;  Location: [facility];  Service: Cardiovascular;  Laterality: N/A;   CARPAL TUNNEL RELEASE Left    CHOLECYSTECTOMY     COLONOSCOPY  06/27/2005   COLONOSCOPY  10/03/2014   CORONARY STENT INTERVENTION N/A 04/15/2021   Procedure: CORONARY STENT INTERVENTION;  Surgeon: Lorretta Harp, MD;  Location: [place] [facility];  Service: Cardiovascular;  Laterality: N/A;   DUODENAL STENT PLACEMENT N/A 11/18/2021   Procedure: DUODENAL STENT PLACEMENT;  Surgeon: Rush Landmark Telford Nab., MD;  [facility];  Service: Gastroenterology;  Laterality: N/A;  axios placed at [place] anastamosis   ESOPHAGEAL DILATION  05/27/2021   Procedure: ESOPHAGEAL DILATION;  Surgeon: Rush Landmark, Telford Nab., MD;  Location: [facility];  Service: Gastroenterology;;   ESOPHAGEAL STENT PLACEMENT N/A 05/27/2021   Procedure: ESOPHAGEAL STENT PLACEMENT;  Surgeon: Irving Copas., MD;  Location: [facility];  Service: Gastroenterology;  Laterality: N/A;   ESOPHAGOGASTRODUODENOSCOPY  06/27/2005   ESOPHAGOGASTRODUODENOSCOPY N/A 03/19/2021   Procedure: ESOPHAGOGASTRODUODENOSCOPY (EGD);  Surgeon: Kieth Brightly, Arta Bruce, MD;  Location: [facility];  Service: General;  Laterality: N/A;   ESOPHAGOGASTRODUODENOSCOPY N/A 08/16/2021   Procedure: ESOPHAGOGASTRODUODENOSCOPY (EGD);  Surgeon: Irving Copas., MD;  Location: [place];  Service: Gastroenterology;  Laterality: N/A;   ESOPHAGOGASTRODUODENOSCOPY (EGD) WITH PROPOFOL N/A 05/27/2021   Procedure: ESOPHAGOGASTRODUODENOSCOPY (EGD) WITH PROPOFOL;  Surgeon: Rush Landmark Telford Nab., MD;  [facility];  Service: Gastroenterology;  Laterality: N/A;   ESOPHAGOGASTRODUODENOSCOPY (EGD) WITH PROPOFOL N/A 07/08/2021   Procedure: ESOPHAGOGASTRODUODENOSCOPY (EGD) WITH PROPOFOL;  Surgeon: Rush Landmark Telford Nab., MD;  [facility];  Service: Gastroenterology;  Laterality: N/A;  fluoro   ESOPHAGOGASTRODUODENOSCOPY (EGD) WITH PROPOFOL N/A 08/14/2021   Procedure: ESOPHAGOGASTRODUODENOSCOPY (EGD) WITH PROPOFOL;  Surgeon: Rush Landmark Telford Nab., MD;  [facility]: [facility];  Service: Gastroenterology;  Laterality: N/A;  fluoro Axios stent (20 mm)   ESOPHAGOGASTRODUODENOSCOPY (EGD) WITH PROPOFOL N/A 10/16/2021   Procedure: ESOPHAGOGASTRODUODENOSCOPY (EGD) WITH PROPOFOL;  Surgeon: Rush Landmark Telford Nab., MD;  [facility];  Service: Gastroenterology;  Laterality: N/A;  axios stent pull fluoro  ESOPHAGOGASTRODUODENOSCOPY (EGD) WITH PROPOFOL N/A 11/18/2021   Procedure: ESOPHAGOGASTRODUODENOSCOPY (EGD) WITH PROPOFOL;  Surgeon: Rush Landmark Telford Nab., MD;  [facility];  Service: Gastroenterology;  Laterality: N/A;   FLEXIBLE SIGMOIDOSCOPY N/A 08/16/2021   Procedure: FLEXIBLE SIGMOIDOSCOPY;  Surgeon: Rush Landmark Telford Nab., MD;  Location: [place];  Service: Gastroenterology;  Laterality: N/A;   GASTRIC ROUX-EN-Y N/A 02/18/2021   Procedure: LAPAROSCOPIC ROUX-EN-Y GASTRIC BYPASS WITH UPPER ENDOSCOPY,;  Surgeon: Kieth Brightly Arta Bruce, MD;  Location: [facility];  Service: General;  Laterality: N/A;   HEMORRHOID SURGERY  08/25/1997   IR [place] TUBE CHANGE  03/27/2021   LAPAROSCOPIC INSERTION GASTROSTOMY TUBE N/A 03/20/2021   Procedure: LAPAROSCOPIC INSERTION GASTROSTOMY TUBE;  Surgeon: Mickeal Skinner, MD;  [facility]: [facility];  Service: General;  Laterality: N/A;   LEFT HEART CATH AND CORONARY ANGIOGRAPHY N/A 04/15/2021   Procedure: LEFT HEART CATH AND CORONARY ANGIOGRAPHY;  Surgeon: Lorretta Harp, MD;  Location: [place] [facility];  Service: Cardiovascular;  Laterality:  N/A;   MOUTH SURGERY     removed area which was benign   STENT REMOVAL  07/08/2021   Procedure: STENT REMOVAL;  Surgeon: Irving Copas., MD;  Location: [facility];  Service: Gastroenterology;;   Lavell Islam REMOVAL  10/16/2021   Procedure: STENT REMOVAL;  Surgeon: Irving Copas., MD;  Location: [facility];  Service: Gastroenterology;;   Lia [place] INJECTION  11/18/2021   Procedure: SUBMUCOSAL INJECTION;  Surgeon: Irving Copas., MD;  Location: [facility];  Service: Gastroenterology;;   TONSILLECTOMY     UPPER GI ENDOSCOPY N/A 03/20/2021   Procedure: UPPER GI ENDOSCOPY;  Surgeon: Kieth Brightly, Arta Bruce, MD;  Location: [facility];  Service: General;  Laterality: N/A;   Social History   Socioeconomic History   Marital status: Married    Spouse name: Not on file   Number of children: Not on file   Years of education: Not on file   Highest education level: Not on file  Occupational History   Not on file  Tobacco Use   Smoking status: Former    Packs/day: 1.00    Years: 15.00    Pack years: 15.00    Types: Cigarettes    Quit date: 08/05/1989    Years since quitting: 32.4   Smokeless tobacco: Never  Vaping Use   Vaping Use: Never used  Substance and Sexual Activity   Alcohol use: Yes    Comment: occ   Drug use: Never   Sexual activity: Not on file  Other Topics Concern   Not on file  Social History Narrative   Not on file   Social Determinants of Health   Financial Resource Strain: Not on file  Food Insecurity: Not on file  Transportation Needs: Not on file  Physical Activity: Not on file  Stress: Not on file  Social Connections: Not on file  Intimate Partner Violence: Not on file   Family History  Problem Relation Age of Onset   Hypertension Mother    Diabetes Mother    Heart attack Father 68   CAD Father    Hypertension Sister    Diabetes Other    Hypertension Other    Heart disease Other    Colon cancer Neg Hx    Esophageal cancer Neg  Hx    Pancreatic cancer Neg Hx    Stomach cancer Neg Hx    Liver disease Neg Hx    Inflammatory bowel disease Neg Hx    Rectal cancer Neg Hx    I  have reviewed his medical, social, and family history in detail and updated the electronic medical record as necessary.    PHYSICAL EXAMINATION  BP 108/76   Pulse 64   Ht 6\' 2"  (1.88 m)   Wt 228 lb 3.2 oz (103.5 kg)   SpO2 97%   BMI 29.30 kg/m  Wt Readings from Last 3 Encounters:  01/14/22 228 lb 3.2 oz (103.5 kg)  11/18/21 227 lb (103 kg)  10/16/21 233 lb (105.7 kg)  GEN: NAD, appears stated age, doesn't appear chronically ill, accompanied by wife and grandsons PSYCH: Cooperative, without pressured speech EYE: Conjunctivae pink, sclerae anicteric ENT: MMM CV: Nontachycardic RESP: No audible wheezing GI: NABS, soft, protuberant abdomen, surgical scars present, without rebound MSK/EXT: Lower extremity edema present SKIN: No jaundice NEURO:  Alert & Oriented x 3, no focal deficits   REVIEW OF DATA  I reviewed the following data at the time of this encounter:  GI Procedures and Studies  March 2023 EGD - No gross lesions in esophagus. - A gastric pouch and gastrojejunostomy anastomosis was found. Staples proximal to the anastomosis were removed. The anastomosis was strictured again unfortunately. Staples noted within the anastomosis could not be removed as they were beyond the scope acquisition area. Steroids injected to stenosis then AXIOS 20 mm LAMS placed.  Laboratory Studies  Reviewed those in epic   Imaging Studies  No new imaging studies to review   ASSESSMENT  Mr. Billey is a 61 y.o. 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  The patient is seen today for evaluation and management of:  1. Gastrojejunal anastomotic stricture   2. Gastric outlet obstruction   3. Esophageal dysphagia   4. History of Roux-en-Y gastric bypass   5. Foreign body in digestive tract, subsequent encounter   6. Abnormal findings on esophagogastroduodenoscopy (EGD)    The patient is hemodynamically and clinically stable at this time.  Unfortunately, he has required 3 AXIOS stenting' to his gastrojejunal anastomosis stricture.  We have kept this AXIOS in for 2 months and we will be removing it next week.  I do have some concern as to our ability to maintain his patency GIST via endoscopy.  If the patient were to have recurrent issues, it is possible he may need an endoscopic stricturotomy.  Time will tell.  We will plan to remove his AXIOS at his upcoming visit, and also plan to try to remove the remaining staples and see if that helps with the recurrent inflammation.  I will also plan to inject further triamcinolone in an effort of trying to decrease risk of recurrence.  I may also go ahead and place a balloon dilator and see if that will help maintain things as well.  Time will tell.  The risks and benefits of endoscopic evaluation were discussed with the patient; these include but are not limited to the risk of perforation, infection, bleeding, missed lesions, lack of diagnosis, severe illness requiring hospitalization, as well as anesthesia and sedation related illnesses.  The patient and/or family is agreeable to proceed.  The patient has been experiencing significant dysgeusia and has not had his vitamins checked after his Roux-en-Y bypass.  I will proceed with some vitamin/micronutrient evaluation in the setting of his known Roux-en-Y gastric bypass to  see if anything is off and may be causing some symptoms.  All patient questions were answered to the best of my ability, and the patient agrees to the aforementioned plan of  action with follow-up as indicated.   PLAN  Proceed with scheduled EGD with fluoroscopy for stent removal and steroid injection and attempt at staple removal next week Laboratories as outlined below to evaluate for vitamin/micronutrient deficiencies in setting of Roux-en-Y gastric bypass Colon cancer screening still due in 2026 for now unless we find evidence of iron deficiency   Orders Placed This Encounter  Procedures   Calcium, ionized   IBC + Ferritin   B12   Folate   INR/PT   Vitamin A   Vitamin D 1,25 dihydroxy   Zinc   Copper, Blood   Selenium serum   Vitamin B1    New Prescriptions   No medications on file   Modified Medications   No medications on file    Planned Follow Up No follow-ups on file.    Total Time in Face-to-Face and in Coordination of Care for patient including independent/personal interpretation/review of prior testing, medical history, examination, medication adjustment, communicating results with the patient directly, and documentation with the EHR is 25 minutes.  Justice Britain, MD [facility] Office # [phone]

## 2022-01-14 NOTE — Patient Instructions (Signed)
Your provider has requested that you go to the basement level for lab work before leaving today. Press "B" on the elevator. The lab is located at the first door on the left as you exit the elevator.  You have been scheduled for an endoscopy. Please follow written instructions given to you at your visit today. If you use inhalers (even only as needed), please bring them with you on the day of your procedure.   If you are age 60 or younger, your body mass index should be between 19-25. Your Body mass index is 29.3 kg/m. If this is out of the aformentioned range listed, please consider follow up with your Primary Care Provider.   ________________________________________________________  The [facility] providers would like to encourage you to use [facility] to communicate with providers for non-urgent requests or questions.  Due to long hold times on the telephone, sending your provider a message by [facility] may be a faster and more efficient way to get a response.  Please allow 48 business hours for a response.  Please remember that this is for non-urgent requests.  _______________________________________________________  Thank you for choosing me and [facility].  Dr. Rush Landmark

## 2022-01-15 LAB — PROTIME-INR
INR: 1.1 ratio — ABNORMAL HIGH (ref 0.8–1.0)
Prothrombin Time: 12.3 s (ref 9.6–13.1)

## 2022-01-15 LAB — IBC + FERRITIN
Ferritin: 26.5 ng/mL (ref 22.0–322.0)
Iron: 45 ug/dL (ref 42–165)
Saturation Ratios: 11.6 % — ABNORMAL LOW (ref 20.0–50.0)
TIBC: 387.8 ug/dL (ref 250.0–450.0)
Transferrin: 277 mg/dL (ref 212.0–360.0)

## 2022-01-15 LAB — VITAMIN B12: Vitamin B-12: 1128 pg/mL — ABNORMAL HIGH (ref 211–911)

## 2022-01-15 LAB — FOLATE: Folate: >24.2 ng/mL (ref >5.9)

## 2022-01-19 DIAGNOSIS — R198 Other specified symptoms and signs involving the digestive system and abdomen: Secondary | ICD-10-CM | POA: Insufficient documentation

## 2022-01-19 DIAGNOSIS — T189XXA Foreign body of alimentary tract, part unspecified, initial encounter: Secondary | ICD-10-CM | POA: Insufficient documentation

## 2022-01-21 LAB — VITAMIN D 1,25 DIHYDROXY
Vitamin D 1, 25 (OH)2 Total: 59 pg/mL (ref 18–72)
Vitamin D2 1, 25 (OH)2: <8 pg/mL
Vitamin D3 1, 25 (OH)2: 59 pg/mL

## 2022-01-21 LAB — VITAMIN A: Vitamin A (Retinoic Acid): 30 ug/dL — ABNORMAL LOW (ref 38–98)

## 2022-01-21 LAB — SELENIUM SERUM: Selenium, Blood: 140 mcg/L (ref 63–160)

## 2022-01-21 LAB — CALCIUM, IONIZED: Calcium, Ion: 5 mg/dL (ref 4.7–5.5)

## 2022-01-21 LAB — ZINC: Zinc: 53 ug/dL — ABNORMAL LOW (ref 60–130)

## 2022-01-22 ENCOUNTER — Other Ambulatory Visit: Payer: Self-pay | Admitting: Gastroenterology

## 2022-01-22 ENCOUNTER — Other Ambulatory Visit (HOSPITAL_COMMUNITY): Payer: Self-pay

## 2022-01-22 ENCOUNTER — Other Ambulatory Visit: Payer: Self-pay | Admitting: Cardiovascular Disease

## 2022-01-22 NOTE — Anesthesia Preprocedure Evaluation (Signed)
Anesthesia Evaluation  Patient identified by MRN, date of birth, ID band Patient awake    Reviewed: Allergy & Precautions, NPO status , Patient's Chart, lab work & pertinent test results, reviewed documented beta blocker date and time   Airway Mallampati: I  TM Distance: >3 FB Neck ROM: Full    Dental no notable dental hx. (+) Teeth Intact, Dental Advisory Given   Pulmonary sleep apnea (BiPAP) , former smoker,    Pulmonary exam normal breath sounds clear to auscultation       Cardiovascular hypertension, Pt. on home beta blockers and Pt. on medications + CAD, + Past MI and + Cardiac Stents  Normal cardiovascular exam+ dysrhythmias Supra Ventricular Tachycardia  Rhythm:Regular Rate:Normal  TTE 2022 1. Left ventricular ejection fraction, by estimation, is 50 to 55%. The  left ventricle has low normal function. The left ventricle demonstrates  regional wall motion abnormalities. Apical hypokinesis. There is moderate  asymmetric left ventricular  hypertrophy of the basal-septal segment. Left ventricular diastolic  parameters are indeterminate.  2. Right ventricular systolic function is normal. The right ventricular  size is mildly enlarged. Tricuspid regurgitation signal is inadequate for  assessing PA pressure.  3. The mitral valve is normal in structure. No evidence of mitral valve  regurgitation. No evidence of mitral stenosis.  4. The aortic valve is tricuspid. Aortic valve regurgitation is not  visualized. Mild aortic valve sclerosis is present, with no evidence of  aortic valve stenosis.  5. Aortic dilatation noted. There is dilatation of the ascending aorta,  measuring 44 mm.  Cath 2022 .  Prox LAD lesion is 80% stenosed. .  1st Diag lesion is 90% stenosed. .  A drug-eluting stent was successfully placed. Marland Kitchen  Post intervention, there is a 0% residual stenosis.    Neuro/Psych negative neurological ROS  negative  psych ROS   GI/Hepatic Neg liver ROS, GERD  ,  Endo/Other  diabetes, Well Controlled, Type 2Morbid obesity  Renal/GU negative Renal ROS  negative genitourinary   Musculoskeletal  (+) Arthritis ,   Abdominal   Peds  Hematology  (+) Blood dyscrasia (on plavix), ,   Anesthesia Other Findings   Reproductive/Obstetrics                            Anesthesia Physical Anesthesia Plan  ASA: 3  Anesthesia Plan: MAC   Post-op Pain Management:    Induction: Intravenous  PONV Risk Score and Plan: Propofol infusion and Treatment may vary due to age or medical condition  Airway Management Planned: Natural Airway  Additional Equipment:   Intra-op Plan:   Post-operative Plan:   Informed Consent: I have reviewed the patients History and Physical, chart, labs and discussed the procedure including the risks, benefits and alternatives for the proposed anesthesia with the patient or authorized representative who has indicated his/her understanding and acceptance.     Dental advisory given  Plan Discussed with: CRNA  Anesthesia Plan Comments:         Anesthesia Quick Evaluation

## 2022-01-23 ENCOUNTER — Encounter (HOSPITAL_COMMUNITY)
Admission: RE | Disposition: A | Discharge status: Home / Self Care | Payer: Self-pay | Source: Home / Self Care | Attending: Gastroenterology

## 2022-01-23 ENCOUNTER — Ambulatory Visit (HOSPITAL_COMMUNITY)
Admission: RE | Admit: 2022-01-23 | Discharge: 2022-01-23 | Disposition: A | Discharge status: Home / Self Care | Payer: PRIVATE HEALTH INSURANCE | Attending: Gastroenterology | Admitting: Gastroenterology

## 2022-01-23 ENCOUNTER — Other Ambulatory Visit: Payer: Self-pay

## 2022-01-23 ENCOUNTER — Other Ambulatory Visit (HOSPITAL_COMMUNITY): Payer: Self-pay

## 2022-01-23 ENCOUNTER — Ambulatory Visit (HOSPITAL_COMMUNITY): Payer: PRIVATE HEALTH INSURANCE | Admitting: Anesthesiology

## 2022-01-23 ENCOUNTER — Encounter (HOSPITAL_COMMUNITY): Payer: Self-pay | Admitting: Gastroenterology

## 2022-01-23 ENCOUNTER — Ambulatory Visit (HOSPITAL_BASED_OUTPATIENT_CLINIC_OR_DEPARTMENT_OTHER): Payer: PRIVATE HEALTH INSURANCE | Admitting: Anesthesiology

## 2022-01-23 DIAGNOSIS — I5030 Unspecified diastolic (congestive) heart failure: Secondary | ICD-10-CM | POA: Insufficient documentation

## 2022-01-23 DIAGNOSIS — Z980 Intestinal bypass and anastomosis status: Secondary | ICD-10-CM | POA: Diagnosis not present

## 2022-01-23 DIAGNOSIS — I252 Old myocardial infarction: Secondary | ICD-10-CM | POA: Insufficient documentation

## 2022-01-23 DIAGNOSIS — K2289 Other specified disease of esophagus: Secondary | ICD-10-CM | POA: Insufficient documentation

## 2022-01-23 DIAGNOSIS — Z7982 Long term (current) use of aspirin: Secondary | ICD-10-CM | POA: Diagnosis not present

## 2022-01-23 DIAGNOSIS — E119 Type 2 diabetes mellitus without complications: Secondary | ICD-10-CM | POA: Diagnosis not present

## 2022-01-23 DIAGNOSIS — T182XXA Foreign body in stomach, initial encounter: Secondary | ICD-10-CM | POA: Diagnosis not present

## 2022-01-23 DIAGNOSIS — Z7902 Long term (current) use of antithrombotics/antiplatelets: Secondary | ICD-10-CM | POA: Diagnosis not present

## 2022-01-23 DIAGNOSIS — K9589 Other complications of other bariatric procedure: Secondary | ICD-10-CM | POA: Diagnosis not present

## 2022-01-23 DIAGNOSIS — R1310 Dysphagia, unspecified: Secondary | ICD-10-CM

## 2022-01-23 DIAGNOSIS — Z9049 Acquired absence of other specified parts of digestive tract: Secondary | ICD-10-CM | POA: Insufficient documentation

## 2022-01-23 DIAGNOSIS — Z955 Presence of coronary angioplasty implant and graft: Secondary | ICD-10-CM | POA: Insufficient documentation

## 2022-01-23 DIAGNOSIS — E785 Hyperlipidemia, unspecified: Secondary | ICD-10-CM | POA: Insufficient documentation

## 2022-01-23 DIAGNOSIS — G4733 Obstructive sleep apnea (adult) (pediatric): Secondary | ICD-10-CM | POA: Insufficient documentation

## 2022-01-23 DIAGNOSIS — I2510 Atherosclerotic heart disease of native coronary artery without angina pectoris: Secondary | ICD-10-CM

## 2022-01-23 DIAGNOSIS — Z9884 Bariatric surgery status: Secondary | ICD-10-CM | POA: Diagnosis not present

## 2022-01-23 DIAGNOSIS — Z87891 Personal history of nicotine dependence: Secondary | ICD-10-CM | POA: Diagnosis not present

## 2022-01-23 DIAGNOSIS — K219 Gastro-esophageal reflux disease without esophagitis: Secondary | ICD-10-CM | POA: Diagnosis not present

## 2022-01-23 DIAGNOSIS — I110 Hypertensive heart disease with heart failure: Secondary | ICD-10-CM | POA: Diagnosis not present

## 2022-01-23 HISTORY — PX: ESOPHAGOGASTRODUODENOSCOPY (EGD) WITH PROPOFOL: SHX5813

## 2022-01-23 HISTORY — PX: ESOPHAGEAL DILATION: SHX303

## 2022-01-23 HISTORY — PX: GASTROINTESTINAL STENT REMOVAL: SHX6384

## 2022-01-23 HISTORY — PX: STERIOD INJECTION: SHX5046

## 2022-01-23 LAB — GLUCOSE, CAPILLARY
Glucose-Capillary: 132 mg/dL — ABNORMAL HIGH (ref 70–99)
Glucose-Capillary: 140 mg/dL — ABNORMAL HIGH (ref 70–99)

## 2022-01-23 SURGERY — ESOPHAGOGASTRODUODENOSCOPY (EGD) WITH PROPOFOL
Anesthesia: Monitor Anesthesia Care

## 2022-01-23 MED ORDER — TRIAMCINOLONE ACETONIDE 40 MG/ML IJ SUSP
INTRAMUSCULAR | Status: AC
  Filled 2022-01-23: qty 1

## 2022-01-23 MED ORDER — PROPOFOL 500 MG/50ML IV EMUL
INTRAVENOUS | Status: DC | PRN
  Administered 2022-01-23: 125 ug/kg/min via INTRAVENOUS

## 2022-01-23 MED ORDER — LACTATED RINGERS IV SOLN: INTRAVENOUS | Status: DC

## 2022-01-23 MED ORDER — PROPOFOL 10 MG/ML IV BOLUS
INTRAVENOUS | Status: DC | PRN
  Administered 2022-01-23: 70 mg via INTRAVENOUS

## 2022-01-23 MED ORDER — CLOPIDOGREL BISULFATE 75 MG PO TABS
75.0000 mg | ORAL_TABLET | Freq: Every day | ORAL | 2 refills | Status: DC
  Filled 2022-01-23: qty 90, 90d supply, fill #0

## 2022-01-23 MED ORDER — TRIAMCINOLONE ACETONIDE 40 MG/ML IJ SUSP
INTRAMUSCULAR | Status: DC | PRN
  Administered 2022-01-23: 40 mg via INTRAMUSCULAR

## 2022-01-23 MED ORDER — LIDOCAINE 2% (20 MG/ML) 5 ML SYRINGE
INTRAMUSCULAR | Status: DC | PRN
  Administered 2022-01-23: 100 mg via INTRAVENOUS

## 2022-01-23 MED FILL — Clopidogrel Bisulfate Tab 75 MG (Base Equiv): ORAL | 90 days supply | Qty: 90 | Fill #0 | Status: CN

## 2022-01-23 SURGICAL SUPPLY — 15 items

## 2022-01-23 NOTE — Anesthesia Procedure Notes (Signed)
Procedure Name: MAC Date/Time: 01/23/2022 7:49 AM Performed by: Lowella Dell, CRNA Pre-anesthesia Checklist: Patient identified, Emergency Drugs available, Suction available, Patient being monitored and Timeout performed Patient Re-evaluated:Patient Re-evaluated prior to induction Oxygen Delivery Method: Nasal cannula Placement Confirmation: positive ETCO2 Dental Injury: Teeth and Oropharynx as per pre-operative assessment

## 2022-01-23 NOTE — Transfer of Care (Signed)
Immediate Anesthesia Transfer of Care Note  Patient: Hector Curry  Procedure(s) Performed: ESOPHAGOGASTRODUODENOSCOPY (EGD) WITH PROPOFOL AXIOS STENT REMOVAL ESOPHAGEAL DILATION STEROID INJECTION  Patient Location: PACU  Anesthesia Type:MAC  Level of Consciousness: awake and patient cooperative  Airway & Oxygen Therapy: Patient Spontanous Breathing  Post-op Assessment: Report given to RN and Post -op Vital signs reviewed and stable  Post vital signs: Reviewed and stable  Last Vitals:  Vitals Value Taken Time  BP 104/70 01/23/22 0836  Temp    Pulse 59 01/23/22 0835  Resp 17 01/23/22 0835  SpO2 94 % 01/23/22 0835  Vitals shown include unvalidated device data.  Last Pain:  Vitals:   01/23/22 0653  TempSrc: Temporal  PainSc: 0-No pain         Complications: No notable events documented.

## 2022-01-23 NOTE — Anesthesia Postprocedure Evaluation (Signed)
Anesthesia Post Note  Patient: Hector Curry  Procedure(s) Performed: ESOPHAGOGASTRODUODENOSCOPY (EGD) WITH PROPOFOL AXIOS STENT REMOVAL ESOPHAGEAL DILATION STEROID INJECTION     Patient location during evaluation: PACU Anesthesia Type: MAC Level of consciousness: awake and alert Pain management: pain level controlled Vital Signs Assessment: post-procedure vital signs reviewed and stable Respiratory status: spontaneous breathing, nonlabored ventilation, respiratory function stable and patient connected to nasal cannula oxygen Cardiovascular status: stable and blood pressure returned to baseline Postop Assessment: no apparent nausea or vomiting Anesthetic complications: no   No notable events documented.  Last Vitals:  Vitals:   01/23/22 0835 01/23/22 0850  BP: 104/70 102/71  Pulse: 62 (!) 51  Resp: 19 16  Temp: 36.5 C   SpO2: 95% 94%    Last Pain:  Vitals:   01/23/22 0850  TempSrc:   PainSc: 0-No pain                 Liviana Mills L Turrell Severt

## 2022-01-23 NOTE — Telephone Encounter (Signed)
Please advise If ok to refill cardiac medication. Last filled by Dr. Meridee Score

## 2022-01-23 NOTE — Op Note (Signed)
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 Doy Hutching, MD Medicines:                Monitored Anesthesia Care Complications:            No immediate complications. Estimated Blood Loss:     Estimated blood loss was minimal. Procedure:                Pre-Anesthesia Assessment:                           - Prior to the procedure, a History and Physical                            was performed, and patient medications and                            allergies were reviewed. The patient's tolerance of                            previous anesthesia was also reviewed. The risks                            and benefits of the procedure and the sedation                            options and risks were discussed with the patient.                            All questions were answered, and informed consent                            was obtained. Prior Anticoagulants: The patient has  taken Plavix (clopidogrel), last dose was 6 days                            prior to procedure. ASA Grade Assessment: III - A                            patient with severe systemic disease. After                            reviewing the risks and benefits, the patient was                            deemed in  satisfactory condition to undergo the                            procedure.                           After obtaining informed consent, the endoscope was                            passed under direct vision. Throughout the                            procedure, the patient's blood pressure, pulse, and                            oxygen saturations were monitored continuously. The                            [id number]-H190 ([id number]) Olympus endoscope was introduced                            through the mouth, and advanced to the jejunum. The                            upper GI endoscopy was accomplished without                            difficulty. The patient tolerated the procedure. Scope In: Scope Out: Findings:      No gross lesions were noted in the entire esophagus.      The Z-line was irregular and was found 44 cm from the incisors.      A metal stent was found in the stomach pouch - previously placed AXIOS.       Stent removal was accomplished with a rat-toothed forceps.      Evidence of the gastrojejunostomy was found in the stomach. This was       characterized by inflammation since the stent was just removed.      A staple was found in the anastomosis. Removal was accomplished with a       regular forceps. A suture was also found and this was cut to the base       with endoscopic scissors.      After looking at the overall anastomosis, it  certainly has enlarged.       However, there was some narrowing in the area just above the proximal       portion of where the stent had been placed. A TTS dilator was passed       through the scope. Dilation with a 15-16.5-18 mm anastomotic balloon       dilator was performed at this region and at the anastomosis. There was       mucosal wrent noted after dilating the proximal portion.      The previously stenosed region at the anastomosis was successfully       injected with 4 mL of triamcinolone (10 mg/mL) to decrease risk of       restenosis.       The examined jejunum was normal. Impression:               - No gross lesions in esophagus. Z-line irregular,                            44 cm from the incisors.                           - Pre-existing AXIOS stent - removed from the                            anastomosis.                           - We could then see the gastrojejunostomy                            characterized by inflammation and oozing from                            recent stent being pulled. Stenosis no longer                            present in this area.                           - A staple was found at the anastomosis and was                            removed. A suture was found and was cut to the base.                           - Dilation performed at the anastomosis and the                            area just proximal to where the stent had been                            placed as there was some narrowing present there as                            well. Mucosal wrent noted in the proximal  portion,                            but no other changes to the previously dilated                            anastomosis.                           - The anastomosis was successfully injected with                            Triamcinolone.                           - Normal examined jejunum. Recommendation:           - The patient will be observed post-procedure,                            until all discharge criteria are met.                           - Discharge patient to home.                           - Patient has a contact number available for                            emergencies. The signs and symptoms of potential                            delayed complications were discussed with the                            patient. Return to normal activities tomorrow.                            Written discharge instructions were provided to the                            patient.                           - Full  liquid diet today.                           - Continue twice daily PPI.                           - Restart Carafate 4 times daily.                           - Plavix restart in 48 hours (6/3).                           - Continue present medications otherwise.                           -  Repeat upper endoscopy in 3-5 weeks for                            surveillance and retreatment with balloon dilations                            if necessary.                           - If issues persist, then referral to quatern[facility]                            center to consider mucosectomy or additional                            therapies but could ultimately need revision to his                            anastomosis surgically if this fails.                           - The findings and recommendations were discussed                            with the patient.                           - The findings and recommendations were discussed                            with the patient's family. Procedure Code(s):        --- Professional ---                           [phone], Esophagogastroduodenoscopy, flexible,                            transoral; with removal of foreign body(s)                           43245, Esophagogastroduodenoscopy, flexible,                            transoral; with dilation of gastric/duodenal                            stricture(s) (eg, balloon, bougie) Diagnosis Code(s):        --- Professional ---                           K22.8, Other specified diseases of esophagus                           Z97.8, Presence of other specified devices                           Z98.0, Intestinal  bypass and anastomosis status                           T18.2XXA, Foreign body in stomach, initial encounter                           R13.10, Dysphagia, unspecified                           Z09, Encounter for follow-up examination after                            completed treatment for conditions  other than                            malignant neoplasm                           Q42.10, Other complications of other bariatric                            procedure CPT copyright 2019 [employer]. All rights reserved. The codes documented in this report are preliminary and upon coder review may  be revised to meet current compliance requirements. Justice Britain, MD 01/23/2022 8:56:07 AM Number of Addenda: 0

## 2022-01-23 NOTE — Interval H&P Note (Signed)
History and Physical Interval Note:  01/23/2022 7:41 AM  Hector Curry  has presented today for surgery, with the diagnosis of post-bariatric anastomotic stenosis.  The various methods of treatment have been discussed with the patient and family. After consideration of risks, benefits and other options for treatment, the patient has consented to  Procedure(s): ESOPHAGOGASTRODUODENOSCOPY (EGD) WITH PROPOFOL (N/A) as a surgical intervention.  The patient's history has been reviewed, patient examined, no change in status, stable for surgery.  I have reviewed the patient's chart and labs.  Questions were answered to the patient's satisfaction.     [employer]

## 2022-01-24 ENCOUNTER — Encounter (HOSPITAL_COMMUNITY): Payer: Self-pay | Admitting: Gastroenterology

## 2022-01-24 LAB — COPPER, BLOOD

## 2022-01-24 LAB — EXTRA SPECIMEN

## 2022-01-24 LAB — COPPER, SERUM: Copper: 128 ug/dL (ref 70–175)

## 2022-01-24 LAB — VITAMIN B1: Vitamin B1 (Thiamine): 43 nmol/L — ABNORMAL HIGH (ref 8–30)

## 2022-01-29 ENCOUNTER — Other Ambulatory Visit (HOSPITAL_COMMUNITY): Payer: Self-pay

## 2022-02-04 NOTE — Progress Notes (Unsigned)
[facility] [address] [place] [address]   CLINIC:  Medical Oncology/Hematology  CONSULT NOTE  Patient Care Team: Marguarite Arbour, MD as PCP - General (Internal Medicine) Mariah Milling Tollie Pizza, MD as PCP - Cardiology (Cardiology) Antonieta Iba, MD as Consulting Physician (Cardiology)  CHIEF COMPLAINTS/PURPOSE OF CONSULTATION:  Iron deficiency anemia  HISTORY OF PRESENTING ILLNESS:  Hector [facility] 61 y.o. male is here at the request of his primary care provider (Dr. Aram Beecham) for evaluation and treatment of iron deficiency anemia with IV iron infusions requested.  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  He was hospitalized from 08/15/2021 through 08/17/2021 with complaints of hematemesis and maroon-colored stools with concern for acute blood loss anemia, thought to be secondary to his stenting while on antiplatelet therapy.  Throughout hospital stay, Hgb dropped to 7.8, transfused 1 unit PRBC.  Most recent EGD (01/23/2022) with completion of GJ stent removal; no gross lesions in the esophagus, but gastrojejunostomy was characterized by inflammation and oozing from recent stent being pulled.  He continues to follow closely with Dr. Irish Lack Roddy, his gastroenterologist.  Review of labs shows normal Hgb prior to December 2022, with lowest Hgb 7.8 (08/16/2021). Most recent CBC (10/30/2021) with Hgb 13.7/MCV 86.0.  Additional labs (01/14/2022) show ferritin 26.5 with 11.6% iron saturation and TIBC 387.8; normal B12, folate, copper.  *** Bleeding  *** Fatigue *** Pica, RLS, headaches *** CP, DOE, palpitations, LH, syncope He reports *** energy and *** appetite.  *** Weight.  *** Vegetarian diet? *** Iron supplement? *** PPI, blood thinner, NSAID *** History of blood transfusion (December 20 22x1) or donation  He has been taking  ferrous sulfate 325 mg *** since ***. He also takes Plavix, but denies any other blood thinners.  *** He takes Protonix 40 mg daily, which he takes ***.  PMH: Sleep apnea, hyperlipidemia, GERD, hypertension, morbid obesity (s/p Roux-en-Y bariatric surgery), type 2 diabetes mellitus, coronary artery disease, iron deficiency anemia ***  SOCIAL: ***  FAMILY: ***   MEDICAL HISTORY:  Past Medical History:  Diagnosis Date   Allergic rhinitis    Atrial tachycardia (HCC)    CAD (coronary artery disease)    a. 05/08/2015 Cath: no significant CAD, LVEF nl-->Med; b. 07/2017 MV: attenuation artifact, no ischemia, EF 65%-->Low risk; c. 03/2021 NSTEMI/PCI: LM nl, LAD 80p (3.0x18 Onyx Frontier DES), D1 90 (too small for PCI), LCX nl, RCA nl.   Diabetes mellitus without complication (HCC)    Diastolic dysfunction    a. 04/2015 Echo: EF 60-65%; b. 05/2016 Echo: EF 60-65%, GrI DD; c. 07/2017 Echo: EF 55-60%, Ao root 31mm; d. 03/2019 Echo: EF 55-60%, Ao root/Asc Ao 71mm; e. 03/2021 Echo: EF 50-55%, apical HK, mod asymm basal-septal hypertrophy. Nl RV fxn. Asc Ao 43mm.   Dilated aortic root (HCC)    a. 07/2017 Echo: 39mm Ao root - mildly dil; b. 03/2019 Echo: Ao root 10mm; c. 03/2021 Echo: Asc Ao 34mm.   Diverticulosis 10/03/2014   Dysrhythmia    Esophageal stricture    a.  In setting of lap band June 2022-stricture at the GJ anastomosis requiring gastrostomy tube; b. 05/2021 s/p esoph stenting (29mm); c. 07/2021 s/p esoph stenting (10mm).   Family history of premature CAD    a. father passed from MI at 44   GERD (gastroesophageal reflux disease)    GIB (gastrointestinal bleeding)    a.  07/2021 following esoph stenting.   Hemorrhoids    a. internal hemorrhoids s/p surgery 1999   History of kidney stones    History of tobacco abuse    Hyperlipidemia    Hyperplastic colon polyp 10/03/2014   a. x 2    Hypertension    Inflammatory arthritis    a. CCP antibodies & x-rays negative. Rheumatoid factor 14, felt  to be crystaline over RA or psoriatic   Morbid obesity ([place])    a. s/p LAP-BAND - complicated by esoph stricture.   OSA (obstructive sleep apnea)    a. on CPAP   Osteoarthritis    PSVT (paroxysmal supraventricular tachycardia) (HCC)    a. 48 hr Holter 04/2015: NSR w/ rare PVC, short runs of narrow complex tachycardiac, possible atrial tach, longest run 7 beats, PACs noted (2% of all beats 3600 total) they did not seem to correlate w/ significant arrythmia; b. 08/2017 Event monitor: no significant arrhythmias.    SURGICAL HISTORY: Past Surgical History:  Procedure Laterality Date   BALLOON DILATION N/A 08/14/2021   Procedure: BALLOON DILATION;  Surgeon: Mansouraty, Telford Nab., MD;  [facility];  Service: Gastroenterology;  Laterality: N/A;   BARIATRIC SURGERY  01/2021   lap band    BILIARY STENT PLACEMENT N/A 08/14/2021   Procedure: AXIOS STENT PLACEMENT;  Surgeon: Irving Copas., MD;  [facility];  Service: Gastroenterology;  Laterality: N/A;   BIOPSY  07/08/2021   Procedure: BIOPSY;  Surgeon: Rush Landmark Telford Nab., MD;  [facility];  Service: Gastroenterology;;   BIOPSY  08/14/2021   Procedure: BIOPSY;  Surgeon: Irving Copas., MD;  Location: [facility];  Service: Gastroenterology;;   CARDIAC CATHETERIZATION N/A 05/08/2015   Procedure: Left Heart Cath and Coronary Angiography;  Surgeon: Jettie Booze, MD;  Location: [facility];  Service: Cardiovascular;  Laterality: N/A;   CARPAL TUNNEL RELEASE Left    CHOLECYSTECTOMY     COLONOSCOPY  06/27/2005   COLONOSCOPY  10/03/2014   CORONARY STENT INTERVENTION N/A 04/15/2021   Procedure: CORONARY STENT INTERVENTION;  Surgeon: Lorretta Harp, MD;  Location: [facility];  Service: Cardiovascular;  Laterality: N/A;   DUODENAL STENT PLACEMENT N/A 11/18/2021   Procedure: DUODENAL STENT PLACEMENT;  Surgeon: Rush Landmark Telford Nab., MD;  [facility];  Service:  Gastroenterology;  Laterality: N/A;  axios placed at [place] anastamosis   ESOPHAGEAL DILATION  05/27/2021   Procedure: ESOPHAGEAL DILATION;  Surgeon: Rush Landmark Telford Nab., MD;  [facility];  Service: Gastroenterology;;   ESOPHAGEAL DILATION  01/23/2022   Procedure: ESOPHAGEAL DILATION;  Surgeon: Irving Copas., MD;  Location: [facility];  Service: Gastroenterology;;   ESOPHAGEAL STENT PLACEMENT N/A 05/27/2021   Procedure: ESOPHAGEAL STENT PLACEMENT;  Surgeon: Irving Copas., MD;  [facility];  Service: Gastroenterology;  Laterality: N/A;   ESOPHAGOGASTRODUODENOSCOPY  06/27/2005   ESOPHAGOGASTRODUODENOSCOPY N/A 03/19/2021   Procedure: ESOPHAGOGASTRODUODENOSCOPY (EGD);  Surgeon: Kieth Brightly, Arta Bruce, MD;  [facility];  Service: General;  Laterality: N/A;   ESOPHAGOGASTRODUODENOSCOPY N/A 08/16/2021   Procedure: ESOPHAGOGASTRODUODENOSCOPY (EGD);  Surgeon: Irving Copas., MD;  Location: [facility];  Service: Gastroenterology;  Laterality: N/A;   ESOPHAGOGASTRODUODENOSCOPY (EGD) WITH PROPOFOL N/A 05/27/2021   Procedure: ESOPHAGOGASTRODUODENOSCOPY (EGD) WITH PROPOFOL;  Surgeon: Rush Landmark Telford Nab., MD;  [facility];  Service: Gastroenterology;  Laterality: N/A;   ESOPHAGOGASTRODUODENOSCOPY (EGD) WITH PROPOFOL N/A 07/08/2021   Procedure: ESOPHAGOGASTRODUODENOSCOPY (EGD) WITH PROPOFOL;  Surgeon: Rush Landmark Telford Nab., MD;  [facility];  Service:  Gastroenterology;  Laterality: N/A;  fluoro   ESOPHAGOGASTRODUODENOSCOPY (EGD) WITH PROPOFOL N/A 08/14/2021   Procedure: ESOPHAGOGASTRODUODENOSCOPY (EGD) WITH PROPOFOL;  Surgeon: Rush Landmark Telford Nab., MD;  [facility];  Service: Gastroenterology;  Laterality: N/A;  fluoro Axios stent (20 mm)   ESOPHAGOGASTRODUODENOSCOPY (EGD) WITH PROPOFOL N/A 10/16/2021   Procedure: ESOPHAGOGASTRODUODENOSCOPY (EGD) WITH PROPOFOL;  Surgeon: Rush Landmark Telford Nab., MD;  [facility];  Service: Gastroenterology;  Laterality: N/A;  axios stent pull fluoro   ESOPHAGOGASTRODUODENOSCOPY (EGD) WITH PROPOFOL N/A 11/18/2021   Procedure: ESOPHAGOGASTRODUODENOSCOPY (EGD) WITH PROPOFOL;  Surgeon: Rush Landmark Telford Nab., MD;  [facility];  Service: Gastroenterology;  Laterality: N/A;   ESOPHAGOGASTRODUODENOSCOPY (EGD) WITH PROPOFOL N/A 01/23/2022   Procedure: ESOPHAGOGASTRODUODENOSCOPY (EGD) WITH PROPOFOL;  Surgeon: Rush Landmark Telford Nab., MD;  Location: [facility];  Service: Gastroenterology;  Laterality: N/A;   FLEXIBLE SIGMOIDOSCOPY N/A 08/16/2021   Procedure: FLEXIBLE SIGMOIDOSCOPY;  Surgeon: Rush Landmark Telford Nab., MD;  Location: [facility];  Service: Gastroenterology;  Laterality: N/A;   GASTRIC ROUX-EN-Y N/A 02/18/2021   Procedure: LAPAROSCOPIC ROUX-EN-Y GASTRIC BYPASS WITH UPPER ENDOSCOPY,;  Surgeon: Kieth Brightly, Arta Bruce, MD;  [facility];  Service: General;  Laterality: N/A;   GASTROINTESTINAL STENT REMOVAL  01/23/2022   Procedure: Theora Master STENT REMOVAL;  Surgeon: Irving Copas., MD;  Location: [facility];  Service: Gastroenterology;;   HEMORRHOID SURGERY  08/25/1997   IR [place] TUBE CHANGE  03/27/2021   LAPAROSCOPIC INSERTION GASTROSTOMY TUBE N/A 03/20/2021   Procedure: LAPAROSCOPIC INSERTION GASTROSTOMY TUBE;  Surgeon: Mickeal Skinner, MD;  [facility];  Service: General;  Laterality: N/A;   LEFT HEART CATH AND CORONARY ANGIOGRAPHY N/A 04/15/2021   Procedure: LEFT HEART CATH AND CORONARY ANGIOGRAPHY;  Surgeon: Lorretta Harp, MD;  Location: [facility];  Service: Cardiovascular;  Laterality: N/A;   MOUTH SURGERY     removed area which was benign   STENT REMOVAL  07/08/2021   Procedure: STENT REMOVAL;  Surgeon: Irving Copas., MD;  [facility];  Service: Gastroenterology;;   Lavell Islam REMOVAL  10/16/2021   Procedure: STENT REMOVAL;  Surgeon: Irving Copas., MD;  [facility];   Service: Gastroenterology;;   STERIOD INJECTION  01/23/2022   Procedure: STEROID INJECTION;  Surgeon: Irving Copas., MD;  Location: [facility];  Service: Gastroenterology;;   SUBMUCOSAL INJECTION  11/18/2021   Procedure: SUBMUCOSAL INJECTION;  Surgeon: Irving Copas., MD;  Location[facility];  Service: Gastroenterology;;   TONSILLECTOMY     UPPER GI ENDOSCOPY N/A 03/20/2021   Procedure: UPPER GI ENDOSCOPY;  Surgeon: Kinsinger, Arta Bruce, MD;  [facility];  Service: General;  Laterality: N/A;    SOCIAL HISTORY: Social History   Socioeconomic History   Marital status: Married    Spouse name: Not on file   Number of children: Not on file   Years of education: Not on file   Highest education level: Not on file  Occupational History   Not on file  Tobacco Use   Smoking status: Former    Packs/day: 1.00    Years: 15.00    Total pack years: 15.00    Types: Cigarettes    Quit date: 08/05/1989    Years since quitting: 32.5   Smokeless tobacco: Never  Vaping Use   Vaping Use: Never used  Substance and Sexual Activity   Alcohol use: Yes    Comment: occ   Drug use: Never   Sexual activity: Not on file  Other Topics Concern   Not  on file  Social History Narrative   Not on file   Social Determinants of Health   Financial Resource Strain: Not on file  Food Insecurity: Not on file  Transportation Needs: Not on file  Physical Activity: Not on file  Stress: Not on file  Social Connections: Not on file  Intimate Partner Violence: Not on file    FAMILY HISTORY: Family History  Problem Relation Age of Onset   Hypertension Mother    Diabetes Mother    Heart attack Father 57   CAD Father    Hypertension Sister    Diabetes Other    Hypertension Other    Heart disease Other    Colon cancer Neg Hx    Esophageal cancer Neg Hx    Pancreatic cancer Neg Hx    Stomach cancer Neg Hx    Liver disease Neg Hx    Inflammatory bowel disease Neg Hx     Rectal cancer Neg Hx     ALLERGIES:  is allergic to other.  MEDICATIONS:  Current Outpatient Medications  Medication Sig Dispense Refill   atorvastatin (LIPITOR) 10 MG tablet Take 1 tablet (10 mg total) by mouth daily. 90 tablet 2   b complex vitamins capsule Take by mouth daily.     Calcium Citrate-Vitamin D (BL CALCIUM CITRATE + D PO) Take 500 mg by mouth. With 500 IU of vitamin D     clopidogrel (PLAVIX) 75 MG tablet Take 1 tablet by mouth daily. 90 tablet 2   Continuous Blood Gluc Sensor (FREESTYLE LIBRE 2 SENSOR) MISC Use as directed every 14 days 10 each 1   ferrous sulfate 325 (65 FE) MG tablet Take 1 tablet by mouth 2 times daily with a meal. 600 tablet 0   fluticasone (FLONASE) 50 MCG/ACT nasal spray Place 1 spray into both nostrils daily as needed for allergies or rhinitis.     metoprolol tartrate (LOPRESSOR) 50 MG tablet Take 1 tablet by mouth 2  times daily. 180 tablet 3   mometasone (ELOCON) 0.1 % lotion Apply to affected areas once daily 60 mL 5   Multiple Vitamin (MULTI-VITAMIN DAILY PO) Take by mouth. Bariartic     nitroGLYCERIN (NITROSTAT) 0.4 MG SL tablet Place 1 tablet (0.4 mg total) under the tongue every 5 (five) minutes as needed for chest pain. 25 tablet 3   pantoprazole (PROTONIX) 40 MG tablet Take 1 tablet (40 mg total) by mouth 2 (two) times daily before a meal. 60 tablet 12   Potassium Chloride ER 20 MEQ TBCR Take 1 tablet by mouth 2 times daily 60 tablet 5   PRESCRIPTION MEDICATION at bedtime. Bipap     Probiotic Product (PROBIOTIC ADVANCED PO) Take 1 capsule by mouth. And PREBIOTIC - bariatric     propranolol (INDERAL) 10 MG tablet Take 1 to 2 tablets  by mouth 3 times daily as needed for tachycardia. (Patient taking differently: Take 1 to 2 tablets  by mouth qhs for tachycardia.) 60 tablet 3   Propylene Glycol (SYSTANE BALANCE OP) Place 1 drop into both eyes daily as needed (dry eyes).     SODIUM FLUORIDE 5000 SENSITIVE 1.1-5 % GEL Place 1 application onto teeth  daily.     sucralfate (CARAFATE) 1 g tablet Take 1 tablet by mouth 4 times daily -  with meals and at bedtime.  ***Can dissolve tablet in a small amount of water prior to taking*** 120 tablet 6   No current facility-administered medications for this visit.  REVIEW OF SYSTEMS:   Review of Systems - Oncology    PHYSICAL EXAMINATION: ECOG PERFORMANCE STATUS: {CHL ONC ECOG [phone]}  There were no vitals filed for this visit. There were no vitals filed for this visit.  Physical Exam    LABORATORY DATA:  I have reviewed the data as listed Recent Results (from the past 2160 hour(s))  Glucose, capillary     Status: Abnormal   Collection Time: 11/18/21 10:24 AM  Result Value Ref Range   Glucose-Capillary 108 (H) 70 - 99 mg/dL    Comment: Glucose reference range applies only to samples taken after fasting for at least 8 hours.  Vitamin B1     Status: Abnormal   Collection Time: 01/14/22  4:37 PM  Result Value Ref Range   Vitamin B1 (Thiamine) 43 (H) 8 - 30 nmol/L    Comment: Marland Kitchen Vitamin supplementation within 24 hours prior to blood draw may affect the accuracy of the results. . This test was developed and its analytical performance characteristics have been determined by [place], [place]. It has not been cleared or approved by the U.S. Food and Drug Administration. This assay has been validated pursuant to the CLIA regulations and is used for clinical purposes. .   Selenium serum     Status: None   Collection Time: 01/14/22  4:37 PM  Result Value Ref Range   Selenium, Blood 140 63 - 160 mcg/L    Comment: . This test was developed and its analytical performance characteristics have been determined by [place], [place]. It has not been cleared or approved by the U.S. Food and Drug Administration. This assay has been validated pursuant to the CLIA regulations and is used for clinical purposes. .    Copper, Blood     Status: None   Collection Time: 01/14/22  4:37 PM  Result Value Ref Range   Copper, Blood CANCELED     Comment: TEST NOT PERFORMED . Test cancelled for reorder purposes.  Result canceled by the ancillary.   Zinc     Status: Abnormal   Collection Time: 01/14/22  4:37 PM  Result Value Ref Range   Zinc 53 (L) 60 - 130 mcg/dL    Comment: . This test was developed and its analytical performance characteristics have been determined by [place], [place]. It has not been cleared or approved by the U.S. Food and Drug Administration. This assay has been validated pursuant to the CLIA regulations and is used for clinical purposes. .   Vitamin D 1,25 dihydroxy     Status: None   Collection Time: 01/14/22  4:37 PM  Result Value Ref Range   Vitamin D 1, 25 (OH)2 Total 59 18 - 72 pg/mL   Vitamin D3 1, 25 (OH)2 59 pg/mL   Vitamin D2 1, 25 (OH)2 <8 pg/mL    Comment: Marland Kitchen Vitamin D3, 1,25(OH)2 indicates both endogenous production and supplementation. Vitamin D2, 1,25(OH)2 is an indicator of exogenous sources, such as diet or supplementation.  Interpretation and therapy are based on measurement of Vitamin D,1,25(OH)2, Total. . . This test was developed and its analytical performance characteristics have been determined by [facility], [place], [place]. It has not been cleared or approved by the FDA. This assay has been validated pursuant to the CLIA regulations and is used for clinical purposes. .   Vitamin A     Status: Abnormal   Collection Time: 01/14/22  4:37 PM  Result Value Ref Range   Vitamin A (Retinoic Acid) 30 (L) 38 - 98 mcg/dL    Comment: Marland Kitchen Vitamin supplementation within 24 hours prior to blood draw may affect the accuracy of the results. . This test was developed and its analytical performance characteristics have been determined by [place], [place]. It has not  been cleared or approved by the U.S. Food and Drug Administration. This assay has been validated pursuant to the CLIA regulations and is used for clinical purposes. .   INR/PT     Status: Abnormal   Collection Time: 01/14/22  4:37 PM  Result Value Ref Range   INR 1.1 (H) 0.8 - 1.0 ratio   Prothrombin Time 12.3 9.6 - 13.1 sec  Folate     Status: None   Collection Time: 01/14/22  4:37 PM  Result Value Ref Range   Folate >24.2 >5.9 ng/mL  B12     Status: Abnormal   Collection Time: 01/14/22  4:37 PM  Result Value Ref Range   Vitamin B-12 1,128 (H) 211 - 911 pg/mL  IBC + Ferritin     Status: Abnormal   Collection Time: 01/14/22  4:37 PM  Result Value Ref Range   Iron 45 42 - 165 ug/dL   Transferrin 277.0 212.0 - 360.0 mg/dL   Saturation Ratios 11.6 (L) 20.0 - 50.0 %   Ferritin 26.5 22.0 - 322.0 ng/mL   TIBC 387.8 250.0 - 450.0 mcg/dL  Calcium, ionized     Status: None   Collection Time: 01/14/22  4:37 PM  Result Value Ref Range   Calcium, Ion 5.0 4.7 - 5.5 mg/dL  Extra Specimen     Status: None   Collection Time: 01/14/22  4:37 PM  Result Value Ref Range   Extra tube recieved      Comment: An extra specimen was received with no test requested. The specimen will be maintained in storage in case  additional testing is needed. Please call the client service department for further assistance. Marland Kitchen    Specimen type recieved Metal-free Vial;   Copper, serum     Status: None   Collection Time: 01/14/22  4:37 PM  Result Value Ref Range   Copper 128 70 - 175 mcg/dL    Comment: . This test was developed and its analytical performance characteristics have been determined by [place], [place]. It has not been cleared or approved by the U.S. Food and Drug Administration. This assay has been validated pursuant to the CLIA regulations and is used for clinical purposes. .   Glucose, capillary     Status: Abnormal   Collection Time: 01/23/22  7:09 AM   Result Value Ref Range   Glucose-Capillary 132 (H) 70 - 99 mg/dL    Comment: Glucose reference range applies only to samples taken after fasting for at least 8 hours.  Glucose, capillary     Status: Abnormal   Collection Time: 01/23/22  8:35 AM  Result Value Ref Range   Glucose-Capillary 140 (H) 70 - 99 mg/dL    Comment: Glucose reference range applies only to samples taken after fasting for at least 8 hours.    RADIOGRAPHIC STUDIES: I have personally reviewed the radiological images as listed and agreed with the findings in the report. SLEEP STUDY DOCUMENTS  Result Date: 02/04/2022 Ordered by an unspecified provider.  SLEEP STUDY DOCUMENTS  Result Date: 02/04/2022 Ordered by an unspecified provider.  SLEEP STUDY DOCUMENTS  Result Date: 01/16/2022  Ordered by an unspecified provider.   ASSESSMENT & PLAN: ***   PLAN SUMMARY & DISPOSITION: ***  All questions were answered. The patient knows to call the clinic with any problems, questions or concerns.   Medical decision making: ***  Time spent on visit: I spent {CHL ONC TIME VISIT - [id number] counseling the patient face to face. The total time spent in the appointment was {CHL ONC TIME VISIT - [id number] and more than 50% was on counseling.  I, Tarri Abernethy PA-C, have seen this patient in conjunction with Dr. Derek Jack.  Greater than 50% of visit was performed by Dr. Delton Coombes.   Harriett Rush, PA-C *** ***  DR. Delton CoombesMarland Kitchen

## 2022-02-05 ENCOUNTER — Inpatient Hospital Stay (HOSPITAL_COMMUNITY): Payer: No Typology Code available for payment source | Attending: Hematology | Admitting: Hematology

## 2022-02-05 ENCOUNTER — Inpatient Hospital Stay (HOSPITAL_COMMUNITY): Payer: No Typology Code available for payment source

## 2022-02-05 ENCOUNTER — Other Ambulatory Visit: Payer: No Typology Code available for payment source

## 2022-02-05 ENCOUNTER — Encounter (HOSPITAL_COMMUNITY): Payer: Self-pay | Admitting: Hematology

## 2022-02-05 ENCOUNTER — Encounter: Payer: No Typology Code available for payment source | Admitting: Oncology

## 2022-02-05 VITALS — BP 119/75 | HR 62 | Temp 97.5°F | Resp 18

## 2022-02-05 VITALS — BP 132/86 | HR 89 | Temp 98.0°F | Resp 16 | Ht 74.0 in | Wt 228.4 lb

## 2022-02-05 DIAGNOSIS — D649 Anemia, unspecified: Secondary | ICD-10-CM

## 2022-02-05 DIAGNOSIS — G4733 Obstructive sleep apnea (adult) (pediatric): Secondary | ICD-10-CM | POA: Diagnosis not present

## 2022-02-05 DIAGNOSIS — E119 Type 2 diabetes mellitus without complications: Secondary | ICD-10-CM | POA: Diagnosis not present

## 2022-02-05 DIAGNOSIS — I10 Essential (primary) hypertension: Secondary | ICD-10-CM | POA: Diagnosis not present

## 2022-02-05 DIAGNOSIS — Z79899 Other long term (current) drug therapy: Secondary | ICD-10-CM | POA: Insufficient documentation

## 2022-02-05 DIAGNOSIS — Z9884 Bariatric surgery status: Secondary | ICD-10-CM | POA: Diagnosis not present

## 2022-02-05 DIAGNOSIS — E785 Hyperlipidemia, unspecified: Secondary | ICD-10-CM | POA: Insufficient documentation

## 2022-02-05 DIAGNOSIS — D508 Other iron deficiency anemias: Secondary | ICD-10-CM | POA: Insufficient documentation

## 2022-02-05 DIAGNOSIS — Z87891 Personal history of nicotine dependence: Secondary | ICD-10-CM | POA: Diagnosis not present

## 2022-02-05 DIAGNOSIS — D509 Iron deficiency anemia, unspecified: Secondary | ICD-10-CM

## 2022-02-05 HISTORY — DX: Iron deficiency anemia, unspecified: D50.9

## 2022-02-05 LAB — CBC WITH DIFFERENTIAL/PLATELET
Abs Immature Granulocytes: 0.18 10*3/uL — ABNORMAL HIGH (ref 0.00–0.07)
Basophils Absolute: 0.1 10*3/uL (ref 0.0–0.1)
Basophils Relative: 0 %
Eosinophils Absolute: 0.2 10*3/uL (ref 0.0–0.5)
Eosinophils Relative: 2 %
HCT: 44.5 % (ref 39.0–52.0)
Hemoglobin: 14.6 g/dL (ref 13.0–17.0)
Immature Granulocytes: 1 %
Lymphocytes Relative: 13 %
Lymphs Abs: 1.6 10*3/uL (ref 0.7–4.0)
MCH: 29.7 pg (ref 26.0–34.0)
MCHC: 32.8 g/dL (ref 30.0–36.0)
MCV: 90.4 fL (ref 80.0–100.0)
Monocytes Absolute: 0.8 10*3/uL (ref 0.1–1.0)
Monocytes Relative: 6 %
Neutro Abs: 9.7 10*3/uL — ABNORMAL HIGH (ref 1.7–7.7)
Neutrophils Relative %: 78 %
Platelets: 178 10*3/uL (ref 150–400)
RBC: 4.92 MIL/uL (ref 4.22–5.81)
RDW: 14.8 % (ref 11.5–15.5)
WBC: 12.5 10*3/uL — ABNORMAL HIGH (ref 4.0–10.5)
nRBC: 0 % (ref 0.0–0.2)

## 2022-02-05 LAB — RETICULOCYTES
Immature Retic Fract: 11.3 % (ref 2.3–15.9)
RBC.: 4.95 MIL/uL (ref 4.22–5.81)
Retic Count, Absolute: 72.3 10*3/uL (ref 19.0–186.0)
Retic Ct Pct: 1.5 % (ref 0.4–3.1)

## 2022-02-05 LAB — LACTATE DEHYDROGENASE: LDH: 116 U/L (ref 98–192)

## 2022-02-05 MED ORDER — SODIUM CHLORIDE 0.9 % IV SOLN
300.0000 mg | Freq: Once | INTRAVENOUS | Status: AC
  Administered 2022-02-05: 300 mg via INTRAVENOUS
  Filled 2022-02-05: qty 300

## 2022-02-05 MED ORDER — ACETAMINOPHEN 325 MG PO TABS
650.0000 mg | ORAL_TABLET | Freq: Once | ORAL | Status: AC
  Administered 2022-02-05: 650 mg via ORAL
  Filled 2022-02-05: qty 2

## 2022-02-05 MED ORDER — SODIUM CHLORIDE 0.9 % IV SOLN: Freq: Once | INTRAVENOUS | Status: AC

## 2022-02-05 MED ORDER — LORATADINE 10 MG PO TABS
10.0000 mg | ORAL_TABLET | Freq: Once | ORAL | Status: AC
  Administered 2022-02-05: 10 mg via ORAL
  Filled 2022-02-05: qty 1

## 2022-02-05 NOTE — Patient Instructions (Addendum)
[place] at [facility] Discharge Instructions  You were seen today by Tarri Abernethy PA-C for your iron deficiency anemia, which is likely a complication of a gastric bypass surgery from June 2022 and gastrointestinal bleeding in December 2022.  We will check labs today to verify your blood count and investigate a few other anemia related markers.  We will schedule you for IV iron x3 doses.  We will recheck labs and see you for follow-up in 2 months.  You can continue to take your oral iron, but this should be taken at least 2 hours apart from your Protonix and Carafate, since these can limit your body's ability to absorb it.    Thank you for choosing [place] at [facility] to provide your oncology and hematology care.  To afford each patient quality time with our provider, please arrive at least 15 minutes before your scheduled appointment time.   If you have a lab appointment with the [facility] please come in thru the Main Entrance and check in at the main information desk.  You need to re-schedule your appointment should you arrive 10 or more minutes late.  We strive to give you quality time with our providers, and arriving late affects you and other patients whose appointments are after yours.  Also, if you no show three or more times for appointments you may be dismissed from the clinic at the providers discretion.     Again, thank you for choosing [facility].  Our hope is that these requests will decrease the amount of time that you wait before being seen by our physicians.       _____________________________________________________________  Should you have questions after your visit to [facility], please contact our office at [phone] and follow the prompts.  Our office hours are 8:00 a.m. and 4:30 p.m. Monday - Friday.  Please note that voicemails left after 4:00 p.m. may not be returned until  the following business day.  We are closed weekends and major holidays.  You do have access to a nurse 24-7, just call the main number to the clinic [phone] and do not press any options, hold on the line and a nurse will answer the phone.    For prescription refill requests, have your pharmacy contact our office and allow 72 hours.    Due to Covid, you will need to wear a mask upon entering the hospital. If you do not have a mask, a mask will be given to you at the Main Entrance upon arrival. For doctor visits, patients may have 1 support person age 14 or older with them. For treatment visits, patients can not have anyone with them due to social distancing guidelines and our immunocompromised population.

## 2022-02-05 NOTE — Progress Notes (Signed)
Patient presents today for iron infusion.  Patient is in satisfactory condition with no complaints voiced.  Vital signs are stable.  We will proceed with infusion per orders by Rojelio Brenner, PA-C.   Patient tolerated iron treatment well with no complaints voiced.  Patient left ambulatory in stable condition.  Vital signs stable at discharge.  Follow up as scheduled.

## 2022-02-05 NOTE — Patient Instructions (Signed)
[facility]  Discharge Instructions: Thank you for choosing [facility] to provide your oncology and hematology care.  If you have a lab appointment with the Cancer Center, please come in thru the Main Entrance and check in at the main information desk.  Wear comfortable clothing and clothing appropriate for easy access to any Portacath or PICC line.   We strive to give you quality time with your provider. You may need to reschedule your appointment if you arrive late (15 or more minutes).  Arriving late affects you and other patients whose appointments are after yours.  Also, if you miss three or more appointments without notifying the office, you may be dismissed from the clinic at the provider's discretion.      For prescription refill requests, have your pharmacy contact our office and allow 72 hours for refills to be completed.         To help prevent nausea and vomiting after your treatment, we encourage you to take your nausea medication as directed.  BELOW ARE SYMPTOMS THAT SHOULD BE REPORTED IMMEDIATELY: *FEVER GREATER THAN 100.4 F (38 C) OR HIGHER *CHILLS OR SWEATING *NAUSEA AND VOMITING THAT IS NOT CONTROLLED WITH YOUR NAUSEA MEDICATION *UNUSUAL SHORTNESS OF BREATH *UNUSUAL BRUISING OR BLEEDING *URINARY PROBLEMS (pain or burning when urinating, or frequent urination) *BOWEL PROBLEMS (unusual diarrhea, constipation, pain near the anus) TENDERNESS IN MOUTH AND THROAT WITH OR WITHOUT PRESENCE OF ULCERS (sore throat, sores in mouth, or a toothache) UNUSUAL RASH, SWELLING OR PAIN  UNUSUAL VAGINAL DISCHARGE OR ITCHING   Items with * indicate a potential emergency and should be followed up as soon as possible or go to the Emergency Department if any problems should occur.  Please show the CHEMOTHERAPY ALERT CARD or IMMUNOTHERAPY ALERT CARD at check-in to the Emergency Department and triage nurse.  Should you have questions after your visit or need to  cancel or reschedule your appointment, please contact [facility] [phone]  and follow the prompts.  Office hours are 8:00 a.m. to 4:30 p.m. Monday - Friday. Please note that voicemails left after 4:00 p.m. may not be returned until the following business day.  We are closed weekends and major holidays. You have access to a nurse at all times for urgent questions. Please call the main number to the clinic [phone] and follow the prompts.  For any non-urgent questions, you may also contact your provider using MyChart. We now offer e-Visits for anyone 18 and older to request care online for non-urgent symptoms. For details visit [email or web address].   Also download the MyChart app! Go to the app store, search "MyChart", open the app, select , and log in with your MyChart username and password.  Masks are optional in the cancer centers. If you would like for your care team to wear a mask while they are taking care of you, please let them know. For doctor visits, patients may have with them one support person who is at least 60 years old. At this time, visitors are not allowed in the infusion area.  

## 2022-02-06 ENCOUNTER — Encounter: Payer: Self-pay | Admitting: Gastroenterology

## 2022-02-06 ENCOUNTER — Telehealth: Payer: Self-pay

## 2022-02-06 ENCOUNTER — Other Ambulatory Visit: Payer: Self-pay

## 2022-02-06 DIAGNOSIS — R1319 Other dysphagia: Secondary | ICD-10-CM

## 2022-02-06 DIAGNOSIS — K311 Adult hypertrophic pyloric stenosis: Secondary | ICD-10-CM

## 2022-02-06 LAB — PROTEIN ELECTROPHORESIS, SERUM
A/G Ratio: 1.2 (ref 0.7–1.7)
Albumin ELP: 3.5 g/dL (ref 2.9–4.4)
Alpha-1-Globulin: 0.2 g/dL (ref 0.0–0.4)
Alpha-2-Globulin: 0.7 g/dL (ref 0.4–1.0)
Beta Globulin: 1.1 g/dL (ref 0.7–1.3)
Gamma Globulin: 0.8 g/dL (ref 0.4–1.8)
Globulin, Total: 2.9 g/dL (ref 2.2–3.9)
Total Protein ELP: 6.4 g/dL (ref 6.0–8.5)

## 2022-02-06 NOTE — Telephone Encounter (Signed)
Repeat upper endoscopy in 3-5 weeks for surveillance and retreatment with balloon dilations if necessary.

## 2022-02-06 NOTE — Telephone Encounter (Signed)
The pt has been scheduled for EGD on 7/17 at 11 am at [facility] with GM.  EGD scheduled, pt instructed and medications reviewed.  Patient instructions mailed to home and sent to My Chart.  Patient to call with any questions or concerns.

## 2022-02-07 LAB — HOMOCYSTEINE: Homocysteine: 7.5 umol/L (ref 0.0–14.5)

## 2022-02-07 LAB — METHYLMALONIC ACID, SERUM: Methylmalonic Acid, Quantitative: 136 nmol/L (ref 0–378)

## 2022-02-11 ENCOUNTER — Other Ambulatory Visit (HOSPITAL_COMMUNITY): Payer: Self-pay

## 2022-02-12 ENCOUNTER — Encounter: Payer: Self-pay | Admitting: Gastroenterology

## 2022-02-12 NOTE — Telephone Encounter (Signed)
Unfortunately I will be away for the next few weeks and do not have availability for repeat endoscopy.  He is already scheduled with me on 17 July.  I have discussed the case with my partner Dr. Christella Hartigan, who is willing to move forward with an endoscopy next week with likely balloon dilation of this recurrent stricturing anastomosis. He will need to be stopped on his Plavix for 5 days prior to the procedure. I have updated the patient via MyChart and I have already spoken with the patient's wife.  [name], Please work on scheduling this patient for EGD with fluoroscopy and dilation next Thursday with Dr. Christella Hartigan at 7:30 AM. Plavix hold for 5 days as per prior.  FYI DJ about patient we discussed.  Thanks. GM

## 2022-02-13 ENCOUNTER — Other Ambulatory Visit (HOSPITAL_BASED_OUTPATIENT_CLINIC_OR_DEPARTMENT_OTHER): Payer: Self-pay

## 2022-02-13 ENCOUNTER — Inpatient Hospital Stay (HOSPITAL_COMMUNITY): Payer: No Typology Code available for payment source

## 2022-02-13 VITALS — BP 120/75 | HR 57 | Temp 99.0°F | Resp 18

## 2022-02-13 DIAGNOSIS — D508 Other iron deficiency anemias: Secondary | ICD-10-CM

## 2022-02-13 MED ORDER — ACETAMINOPHEN 325 MG PO TABS
650.0000 mg | ORAL_TABLET | Freq: Once | ORAL | Status: AC
  Administered 2022-02-13: 650 mg via ORAL
  Filled 2022-02-13: qty 2

## 2022-02-13 MED ORDER — SODIUM CHLORIDE 0.9 % IV SOLN: Freq: Once | INTRAVENOUS | Status: AC

## 2022-02-13 MED ORDER — LORATADINE 10 MG PO TABS
10.0000 mg | ORAL_TABLET | Freq: Once | ORAL | Status: AC
  Administered 2022-02-13: 10 mg via ORAL
  Filled 2022-02-13: qty 1

## 2022-02-13 MED ORDER — SODIUM CHLORIDE 0.9 % IV SOLN
300.0000 mg | Freq: Once | INTRAVENOUS | Status: AC
  Administered 2022-02-13: 300 mg via INTRAVENOUS
  Filled 2022-02-13: qty 300

## 2022-02-13 NOTE — Progress Notes (Signed)
Pt presents today for Venofer IV iron infusion per provider's order. Vital signs stable and pt voiced no mew complaints at this time.  Peripheral IV started with good blood return pre and post infusion.  Venofer 300 mg given today per MD orders. Tolerated infusion without adverse affects. Vital signs stable. No complaints at this time. Discharged from clinic ambulatory in stable condition. Alert and oriented x 3. F/U with [facility] as scheduled.

## 2022-02-13 NOTE — Patient Instructions (Signed)
[facility]  Discharge Instructions: Thank you for choosing [facility] to provide your oncology and hematology care.  If you have a lab appointment with the Cancer Center, please come in thru the Main Entrance and check in at the main information desk.  Wear comfortable clothing and clothing appropriate for easy access to any Portacath or PICC line.   We strive to give you quality time with your provider. You may need to reschedule your appointment if you arrive late (15 or more minutes).  Arriving late affects you and other patients whose appointments are after yours.  Also, if you miss three or more appointments without notifying the office, you may be dismissed from the clinic at the provider's discretion.      For prescription refill requests, have your pharmacy contact our office and allow 72 hours for refills to be completed.    Today you received Venofer IV iron.    BELOW ARE SYMPTOMS THAT SHOULD BE REPORTED IMMEDIATELY: *FEVER GREATER THAN 100.4 F (38 C) OR HIGHER *CHILLS OR SWEATING *NAUSEA AND VOMITING THAT IS NOT CONTROLLED WITH YOUR NAUSEA MEDICATION *UNUSUAL SHORTNESS OF BREATH *UNUSUAL BRUISING OR BLEEDING *URINARY PROBLEMS (pain or burning when urinating, or frequent urination) *BOWEL PROBLEMS (unusual diarrhea, constipation, pain near the anus) TENDERNESS IN MOUTH AND THROAT WITH OR WITHOUT PRESENCE OF ULCERS (sore throat, sores in mouth, or a toothache) UNUSUAL RASH, SWELLING OR PAIN  UNUSUAL VAGINAL DISCHARGE OR ITCHING   Items with * indicate a potential emergency and should be followed up as soon as possible or go to the Emergency Department if any problems should occur.  Please show the CHEMOTHERAPY ALERT CARD or IMMUNOTHERAPY ALERT CARD at check-in to the Emergency Department and triage nurse.  Should you have questions after your visit or need to cancel or reschedule your appointment, please contact [facility] [phone]   and follow the prompts.  Office hours are 8:00 a.m. to 4:30 p.m. Monday - Friday. Please note that voicemails left after 4:00 p.m. may not be returned until the following business day.  We are closed weekends and major holidays. You have access to a nurse at all times for urgent questions. Please call the main number to the clinic [phone] and follow the prompts.  For any non-urgent questions, you may also contact your provider using MyChart. We now offer e-Visits for anyone 18 and older to request care online for non-urgent symptoms. For details visit [email or web address].   Also download the MyChart app! Go to the app store, search "MyChart", open the app, select [place], and log in with your MyChart username and password.  Masks are optional in the cancer centers. If you would like for your care team to wear a mask while they are taking care of you, please let them know. For doctor visits, patients may have with them one support person who is at least 60 years old. At this time, visitors are not allowed in the infusion area.  

## 2022-02-17 ENCOUNTER — Encounter (HOSPITAL_COMMUNITY): Payer: Self-pay | Admitting: Hematology

## 2022-02-19 NOTE — Anesthesia Preprocedure Evaluation (Signed)
Anesthesia Evaluation  Patient identified by MRN, date of birth, ID band Patient awake    Reviewed: Allergy & Precautions, NPO status , Patient's Chart, lab work & pertinent test results  History of Anesthesia Complications Negative for: history of anesthetic complications  Airway Mallampati: III  TM Distance: >3 FB Neck ROM: Full    Dental no notable dental hx. (+) Dental Advisory Given   Pulmonary sleep apnea (BiPAP) , former smoker,    Pulmonary exam normal        Cardiovascular hypertension, Pt. on home beta blockers and Pt. on medications + CAD, + Past MI and + Cardiac Stents  Normal cardiovascular exam+ dysrhythmias Supra Ventricular Tachycardia   TTE 2022 1. Left ventricular ejection fraction, by estimation, is 50 to 55%. The  left ventricle has low normal function. The left ventricle demonstrates  regional wall motion abnormalities. Apical hypokinesis. There is moderate  asymmetric left ventricular  hypertrophy of the basal-septal segment. Left ventricular diastolic  parameters are indeterminate.  2. Right ventricular systolic function is normal. The right ventricular  size is mildly enlarged. Tricuspid regurgitation signal is inadequate for  assessing PA pressure.  3. The mitral valve is normal in structure. No evidence of mitral valve  regurgitation. No evidence of mitral stenosis.  4. The aortic valve is tricuspid. Aortic valve regurgitation is not  visualized. Mild aortic valve sclerosis is present, with no evidence of  aortic valve stenosis.  5. Aortic dilatation noted. There is dilatation of the ascending aorta,  measuring 44 mm.  Cath 2022 .  Prox LAD lesion is 80% stenosed. .  1st Diag lesion is 90% stenosed. .  A drug-eluting stent was successfully placed. Marland Kitchen  Post intervention, there is a 0% residual stenosis.    Neuro/Psych negative neurological ROS  negative psych ROS   GI/Hepatic Neg liver  ROS, GERD  ,  Endo/Other  diabetes, Well Controlled, Type 2  Renal/GU negative Renal ROS  negative genitourinary   Musculoskeletal  (+) Arthritis ,   Abdominal   Peds  Hematology  (+) Blood dyscrasia (on plavix), ,   Anesthesia Other Findings   Reproductive/Obstetrics                            Anesthesia Physical  Anesthesia Plan  ASA: 3  Anesthesia Plan: MAC   Post-op Pain Management: Minimal or no pain anticipated   Induction: Intravenous  PONV Risk Score and Plan: 1 and Propofol infusion and Treatment may vary due to age or medical condition  Airway Management Planned: Natural Airway  Additional Equipment:   Intra-op Plan:   Post-operative Plan:   Informed Consent: I have reviewed the patients History and Physical, chart, labs and discussed the procedure including the risks, benefits and alternatives for the proposed anesthesia with the patient or authorized representative who has indicated his/her understanding and acceptance.     Dental advisory given  Plan Discussed with: Anesthesiologist and CRNA  Anesthesia Plan Comments:        Anesthesia Quick Evaluation

## 2022-02-20 ENCOUNTER — Ambulatory Visit (HOSPITAL_COMMUNITY): Payer: No Typology Code available for payment source | Admitting: Anesthesiology

## 2022-02-20 ENCOUNTER — Ambulatory Visit (HOSPITAL_COMMUNITY): Payer: No Typology Code available for payment source

## 2022-02-20 ENCOUNTER — Other Ambulatory Visit: Payer: Self-pay

## 2022-02-20 ENCOUNTER — Encounter (HOSPITAL_COMMUNITY)
Admission: AD | Disposition: A | Discharge status: Home / Self Care | Payer: Self-pay | Source: Home / Self Care | Attending: Gastroenterology

## 2022-02-20 ENCOUNTER — Ambulatory Visit (HOSPITAL_COMMUNITY)
Admission: AD | Admit: 2022-02-20 | Discharge: 2022-02-20 | Disposition: A | Discharge status: Home / Self Care | Payer: No Typology Code available for payment source | Attending: Gastroenterology | Admitting: Gastroenterology

## 2022-02-20 ENCOUNTER — Other Ambulatory Visit (HOSPITAL_COMMUNITY): Payer: Self-pay

## 2022-02-20 ENCOUNTER — Ambulatory Visit (HOSPITAL_BASED_OUTPATIENT_CLINIC_OR_DEPARTMENT_OTHER): Payer: No Typology Code available for payment source | Admitting: Anesthesiology

## 2022-02-20 ENCOUNTER — Encounter (HOSPITAL_COMMUNITY): Payer: Self-pay | Admitting: Gastroenterology

## 2022-02-20 DIAGNOSIS — Z7902 Long term (current) use of antithrombotics/antiplatelets: Secondary | ICD-10-CM | POA: Insufficient documentation

## 2022-02-20 DIAGNOSIS — G4733 Obstructive sleep apnea (adult) (pediatric): Secondary | ICD-10-CM | POA: Insufficient documentation

## 2022-02-20 DIAGNOSIS — Z955 Presence of coronary angioplasty implant and graft: Secondary | ICD-10-CM | POA: Diagnosis not present

## 2022-02-20 DIAGNOSIS — I471 Supraventricular tachycardia: Secondary | ICD-10-CM | POA: Diagnosis not present

## 2022-02-20 DIAGNOSIS — E119 Type 2 diabetes mellitus without complications: Secondary | ICD-10-CM | POA: Diagnosis not present

## 2022-02-20 DIAGNOSIS — I252 Old myocardial infarction: Secondary | ICD-10-CM | POA: Insufficient documentation

## 2022-02-20 DIAGNOSIS — K3189 Other diseases of stomach and duodenum: Secondary | ICD-10-CM | POA: Diagnosis not present

## 2022-02-20 DIAGNOSIS — I10 Essential (primary) hypertension: Secondary | ICD-10-CM | POA: Insufficient documentation

## 2022-02-20 DIAGNOSIS — Z980 Intestinal bypass and anastomosis status: Secondary | ICD-10-CM | POA: Diagnosis not present

## 2022-02-20 DIAGNOSIS — T189XXA Foreign body of alimentary tract, part unspecified, initial encounter: Secondary | ICD-10-CM | POA: Diagnosis not present

## 2022-02-20 DIAGNOSIS — R1319 Other dysphagia: Secondary | ICD-10-CM

## 2022-02-20 DIAGNOSIS — K913 Postprocedural intestinal obstruction, unspecified as to partial versus complete: Secondary | ICD-10-CM | POA: Diagnosis not present

## 2022-02-20 DIAGNOSIS — Y832 Surgical operation with anastomosis, bypass or graft as the cause of abnormal reaction of the patient, or of later complication, without mention of misadventure at the time of the procedure: Secondary | ICD-10-CM | POA: Diagnosis not present

## 2022-02-20 DIAGNOSIS — D759 Disease of blood and blood-forming organs, unspecified: Secondary | ICD-10-CM | POA: Diagnosis not present

## 2022-02-20 DIAGNOSIS — I2510 Atherosclerotic heart disease of native coronary artery without angina pectoris: Secondary | ICD-10-CM | POA: Diagnosis not present

## 2022-02-20 DIAGNOSIS — R1310 Dysphagia, unspecified: Secondary | ICD-10-CM | POA: Diagnosis not present

## 2022-02-20 DIAGNOSIS — K9589 Other complications of other bariatric procedure: Secondary | ICD-10-CM | POA: Insufficient documentation

## 2022-02-20 DIAGNOSIS — K910 Vomiting following gastrointestinal surgery: Secondary | ICD-10-CM | POA: Insufficient documentation

## 2022-02-20 DIAGNOSIS — Z9884 Bariatric surgery status: Secondary | ICD-10-CM | POA: Insufficient documentation

## 2022-02-20 DIAGNOSIS — K311 Adult hypertrophic pyloric stenosis: Secondary | ICD-10-CM

## 2022-02-20 HISTORY — PX: ESOPHAGOGASTRODUODENOSCOPY (EGD) WITH PROPOFOL: SHX5813

## 2022-02-20 HISTORY — PX: FOREIGN BODY REMOVAL: SHX962

## 2022-02-20 HISTORY — PX: BALLOON DILATION: SHX5330

## 2022-02-20 LAB — GLUCOSE, CAPILLARY
Glucose-Capillary: 144 mg/dL — ABNORMAL HIGH (ref 70–99)
Glucose-Capillary: 137 mg/dL — ABNORMAL HIGH (ref 70–99)

## 2022-02-20 SURGERY — ESOPHAGOGASTRODUODENOSCOPY (EGD) WITH PROPOFOL
Anesthesia: Monitor Anesthesia Care

## 2022-02-20 MED ORDER — PROPOFOL 500 MG/50ML IV EMUL
INTRAVENOUS | Status: DC | PRN
  Administered 2022-02-20: 125 ug/kg/min via INTRAVENOUS

## 2022-02-20 MED ORDER — PROPOFOL 10 MG/ML IV BOLUS
INTRAVENOUS | Status: AC
  Filled 2022-02-20: qty 20

## 2022-02-20 MED ORDER — PROPOFOL 500 MG/50ML IV EMUL
INTRAVENOUS | Status: AC
  Filled 2022-02-20: qty 50

## 2022-02-20 MED ORDER — LIDOCAINE HCL (CARDIAC) PF 100 MG/5ML IV SOSY
PREFILLED_SYRINGE | INTRAVENOUS | Status: DC | PRN
  Administered 2022-02-20: 100 mg via INTRAVENOUS

## 2022-02-20 MED ORDER — LACTATED RINGERS IV SOLN: INTRAVENOUS | Status: DC | PRN

## 2022-02-20 MED ORDER — DOXYCYCLINE HYCLATE 100 MG PO TABS
ORAL_TABLET | ORAL | 0 refills | Status: DC
  Filled 2022-02-20: qty 14, 7d supply, fill #0

## 2022-02-20 MED ORDER — LACTATED RINGERS IV SOLN: INTRAVENOUS | Status: DC

## 2022-02-20 MED ORDER — SODIUM CHLORIDE 0.9 % IV SOLN: INTRAVENOUS | Status: DC

## 2022-02-20 MED ORDER — PROPOFOL 10 MG/ML IV BOLUS
INTRAVENOUS | Status: DC | PRN
  Administered 2022-02-20: 40 mg via INTRAVENOUS
  Administered 2022-02-20: 20 mg via INTRAVENOUS
  Administered 2022-02-20: 10 mg via INTRAVENOUS
  Administered 2022-02-20: 20 mg via INTRAVENOUS
  Administered 2022-02-20: 30 mg via INTRAVENOUS

## 2022-02-20 SURGICAL SUPPLY — 15 items

## 2022-02-20 NOTE — Discharge Instructions (Signed)
YOU HAD AN ENDOSCOPIC PROCEDURE TODAY: Refer to the procedure report and other information in the discharge instructions given to you for any specific questions about what was found during the examination. If this information does not answer your questions, please call [place] office at [phone] to clarify.  ° °YOU SHOULD EXPECT: Some feelings of bloating in the abdomen. Passage of more gas than usual. Walking can help get rid of the air that was put into your GI tract during the procedure and reduce the bloating. If you had a lower endoscopy (such as a colonoscopy or flexible sigmoidoscopy) you may notice spotting of blood in your stool or on the toilet paper. Some abdominal soreness may be present for a day or two, also. ° °DIET: Your first meal following the procedure should be a light meal and then it is ok to progress to your normal diet. A half-sandwich or bowl of soup is an example of a good first meal. Heavy or fried foods are harder to digest and may make you feel nauseous or bloated. Drink plenty of fluids but you should avoid alcoholic beverages for 24 hours. If you had a esophageal dilation, please see attached instructions for diet.   ° °ACTIVITY: Your care partner should take you home directly after the procedure. You should plan to take it easy, moving slowly for the rest of the day. You can resume normal activity the day after the procedure however YOU SHOULD NOT DRIVE, use power tools, machinery or perform tasks that involve climbing or major physical exertion for 24 hours (because of the sedation medicines used during the test).  ° °SYMPTOMS TO REPORT IMMEDIATELY: °A gastroenterologist can be reached at any hour. Please call [phone]  for any of the following symptoms:  °Following lower endoscopy (colonoscopy, flexible sigmoidoscopy) °Excessive amounts of blood in the stool  °Significant tenderness, worsening of abdominal pains  °Swelling of the abdomen that is new, acute  °Fever of 100° or  higher  °Following upper endoscopy (EGD, EUS, ERCP, esophageal dilation) °Vomiting of blood or coffee ground material  °New, significant abdominal pain  °New, significant chest pain or pain under the shoulder blades  °Painful or persistently difficult swallowing  °New shortness of breath  °Black, tarry-looking or red, bloody stools ° °FOLLOW UP:  °If any biopsies were taken you will be contacted by phone or by letter within the next 1-3 weeks. Call [phone]  if you have not heard about the biopsies in 3 weeks.  °Please also call with any specific questions about appointments or follow up tests. ° °

## 2022-02-20 NOTE — Anesthesia Postprocedure Evaluation (Signed)
Anesthesia Post Note  Patient: Hector Curry  Procedure(s) Performed: ESOPHAGOGASTRODUODENOSCOPY (EGD) WITH PROPOFOL FOREIGN BODY REMOVAL BALLOON DILATION     Patient location during evaluation: PACU Anesthesia Type: MAC Level of consciousness: awake and alert Pain management: pain level controlled Vital Signs Assessment: post-procedure vital signs reviewed and stable Respiratory status: spontaneous breathing and respiratory function stable Cardiovascular status: stable Postop Assessment: no apparent nausea or vomiting Anesthetic complications: no   No notable events documented.  Last Vitals:  Vitals:   02/20/22 0810 02/20/22 0820  BP: 126/81 127/81  Pulse: (!) 58 (!) 57  Resp: 18 (!) 9  Temp:    SpO2: 98% 96%    Last Pain:  Vitals:   02/20/22 0820  TempSrc:   PainSc: 0-No pain                 Alexya Mcdaris DANIEL

## 2022-02-20 NOTE — Op Note (Signed)
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                            Meridee Score 01/23/2022 removed axios stent, removed                            nearby staple, cut nearby suture, dilated the area                            to 25mm with TTS balloon dilator, site injected                            with steroids Providers:                Rachael Fee, MD, Lorenza Evangelist, RN,                            Kandice Robinsons, Technician, Irene Shipper, Technician Referring MD:             Molli Posey, MD Medicines:                Monitored Anesthesia Care Complications:            No immediate complications. Estimated blood loss:                            None. Estimated Blood Loss:     Estimated blood loss: none. Procedure:                Pre-Anesthesia Assessment:                           - Prior to the procedure, a History and Physical                            was performed, and patient medications and                            allergies were reviewed. The patient's tolerance of                            previous anesthesia was also reviewed. The risks                            and benefits of the procedure and the sedation  options and risks were discussed with the patient.                            All questions were answered, and informed consent                            was obtained. Prior Anticoagulants: The patient has                            taken Plavix (clopidogrel), last dose was 5 days                            prior to procedure. ASA Grade Assessment: III - A                             patient with severe systemic disease. After                            reviewing the risks and benefits, the patient was                            deemed in satisfactory condition to undergo the                            procedure.                           After obtaining informed consent, the endoscope was                            passed under direct vision. Throughout the                            procedure, the patient's blood pressure, pulse, and                            oxygen saturations were monitored continuously. The                            [id number]-[id number] ([id number]) Olympus endoscope was introduced                            through the mouth, and advanced to the afferent and                            efferent jejunal loops. The upper GI endoscopy was                            accomplished without difficulty. The patient                            tolerated the procedure well. Scope In: Scope Out: Findings:      White colored pill impacted at GJ anastomosis stricture. I removed this  with a Lucina Mellow net and then inspected the stricture. The lumen through the       stricture was quite tight, about 4 or 5 mm across. I was unable to       advance the adult gastroscope through the stricture. There was a staple       evident at the stricture site which helps as a size reference. I dilated       the stricture using a TTS balloon dilator up to 15 mm. This resulted in       substantial mucosal rent and self-limited oozing. There was no obvious       perforation. I was able to advance the adult gastroscope through the       stricture site very easily after dilation. The afferent and E ferret       limbs were normal-appearing. 3 perianastomotic staples were noted and I       removed all of them with forceps.      The exam was otherwise without abnormality. Impression:               - White colored pill impacted at GJ anastomosis                            stricture. I  removed this with a Lucina Mellow net and then                            inspected the stricture. The lumen through the                            stricture was quite tight, about 4 or 5 mm across.                            I was unable to advance the adult gastroscope                            through the stricture. There was a staple evident                            at the stricture site which helps as a size                            reference. I dilated the stricture using a TTS                            balloon dilator up to 15 mm. This resulted in                            substantial mucosal rent and self-limited oozing.                            There was no obvious perforation. I was able to                            advance the adult gastroscope through the stricture  site very easily after dilation. The afferent and E                            ferret limbs were normal-appearing. 3                            perianastomotic staples were noted and I removed                            all of them with forceps.                           - The exam was otherwise normal. Moderate Sedation:      Not Applicable - Patient had care per Anesthesia. Recommendation:           - Follow-up as already planned with Dr. Meridee Score                           - You can resume your Plavix tomorrow.                           - Advance diet as tolerated starting tomorrow,                            today should be full liquids only. Procedure Code(s):        --- Professional ---                           [phone], Esophagogastroduodenoscopy, flexible,                            transoral; diagnostic, including collection of                            specimen(s) by brushing or washing, when performed                            (separate procedure) Diagnosis Code(s):        --- Professional ---                           K31.89, Other diseases of stomach and duodenum                            R11.2, Nausea with vomiting, unspecified CPT copyright 2019 [employer]. All rights reserved. The codes documented in this report are preliminary and upon coder review may  be revised to meet current compliance requirements. Rachael Fee, MD 02/20/2022 7:59:14 AM This report has been signed electronically. Number of Addenda: 0

## 2022-02-20 NOTE — H&P (Signed)
HPI: This is a man with GJ anastomotic stricture, dysphagia  Last EGD Dr. Meridee Score 01/23/2022 removed axios stent, removed nearby staple, cut nearby suture, dilated the area to 45mm with TTS balloon dilator   ROS: complete GI ROS as described in HPI, all other review negative.  Constitutional:  No unintentional weight loss   Past Medical History:  Diagnosis Date   Allergic rhinitis    Atrial tachycardia (HCC)    CAD (coronary artery disease)    a. 05/08/2015 Cath: no significant CAD, LVEF nl-->Med; b. 07/2017 MV: attenuation artifact, no ischemia, EF 65%-->Low risk; c. 03/2021 NSTEMI/PCI: LM nl, LAD 80p (3.0x18 Onyx Frontier DES), D1 90 (too small for PCI), LCX nl, RCA nl.   Diabetes mellitus without complication (HCC)    Diastolic dysfunction    a. 04/2015 Echo: EF 60-65%; b. 05/2016 Echo: EF 60-65%, GrI DD; c. 07/2017 Echo: EF 55-60%, Ao root 3mm; d. 03/2019 Echo: EF 55-60%, Ao root/Asc Ao 37mm; e. 03/2021 Echo: EF 50-55%, apical HK, mod asymm basal-septal hypertrophy. Nl RV fxn. Asc Ao 21mm.   Dilated aortic root (HCC)    a. 07/2017 Echo: 73mm Ao root - mildly dil; b. 03/2019 Echo: Ao root 47mm; c. 03/2021 Echo: Asc Ao 51mm.   Diverticulosis 10/03/2014   Dysrhythmia    Esophageal stricture    a.  In setting of lap band June 2022-stricture at the GJ anastomosis requiring gastrostomy tube; b. 05/2021 s/p esoph stenting (27mm); c. 07/2021 s/p esoph stenting (28mm).   Family history of premature CAD    a. father passed from MI at 66   GERD (gastroesophageal reflux disease)    GIB (gastrointestinal bleeding)    a. 07/2021 following esoph stenting.   Hemorrhoids    a. internal hemorrhoids s/p surgery 1999   History of kidney stones    History of tobacco abuse    Hyperlipidemia    Hyperplastic colon polyp 10/03/2014   a. x 2    Hypertension    Inflammatory arthritis    a. CCP antibodies & x-rays negative. Rheumatoid factor 14, felt to be crystaline over RA or psoriatic   Iron  deficiency anemia 02/05/2022   Morbid obesity (HCC)    a. s/p LAP-BAND - complicated by esoph stricture.   OSA (obstructive sleep apnea)    a. on CPAP   Osteoarthritis    PSVT (paroxysmal supraventricular tachycardia) (HCC)    a. 48 hr Holter 04/2015: NSR w/ rare PVC, short runs of narrow complex tachycardiac, possible atrial tach, longest run 7 beats, PACs noted (2% of all beats 3600 total) they did not seem to correlate w/ significant arrythmia; b. 08/2017 Event monitor: no significant arrhythmias.    Past Surgical History:  Procedure Laterality Date   BALLOON DILATION N/A 08/14/2021   Procedure: BALLOON DILATION;  Surgeon: Mansouraty, Netty Starring., MD;  Location: [facility];  Service: Gastroenterology;  Laterality: N/A;   BARIATRIC SURGERY  01/2021   lap band    BILIARY STENT PLACEMENT N/A 08/14/2021   Procedure: AXIOS STENT PLACEMENT;  Surgeon: Lemar Lofty., MD;  Location: [facility];  Service: Gastroenterology;  Laterality: N/A;   BIOPSY  07/08/2021   Procedure: BIOPSY;  Surgeon: Meridee Score Netty Starring., MD;  Location: [facility];  Service: Gastroenterology;;   BIOPSY  08/14/2021   Procedure: BIOPSY;  Surgeon: Lemar Lofty., MD;  Location: [facility];  Service: Gastroenterology;;   CARDIAC CATHETERIZATION N/A 05/08/2015   Procedure: Left Heart Cath and Coronary Angiography;  Surgeon: Corky Crafts, MD;  Location: [place] [facility];  Service: Cardiovascular;  Laterality: N/A;   CARPAL TUNNEL RELEASE Left    CHOLECYSTECTOMY     COLONOSCOPY  06/27/2005   COLONOSCOPY  10/03/2014   CORONARY STENT INTERVENTION N/A 04/15/2021   Procedure: CORONARY STENT INTERVENTION;  Surgeon: Lorretta Harp, MD;  Location: [place] [facility];  Service: Cardiovascular;  Laterality: N/A;   DUODENAL STENT PLACEMENT N/A 11/18/2021   Procedure: DUODENAL STENT PLACEMENT;  Surgeon: Rush Landmark Telford Nab., MD;  Location: [facility];  Service: Gastroenterology;  Laterality:  N/A;  axios placed at [place] anastamosis   ESOPHAGEAL DILATION  05/27/2021   Procedure: ESOPHAGEAL DILATION;  Surgeon: Rush Landmark Telford Nab., MD;  Location: [facility];  Service: Gastroenterology;;   ESOPHAGEAL DILATION  01/23/2022   Procedure: ESOPHAGEAL DILATION;  Surgeon: Irving Copas., MD;  Location: [place];  Service: Gastroenterology;;   ESOPHAGEAL STENT PLACEMENT N/A 05/27/2021   Procedure: ESOPHAGEAL STENT PLACEMENT;  Surgeon: Irving Copas., MD;  Location: [facility];  Service: Gastroenterology;  Laterality: N/A;   ESOPHAGOGASTRODUODENOSCOPY  06/27/2005   ESOPHAGOGASTRODUODENOSCOPY N/A 03/19/2021   Procedure: ESOPHAGOGASTRODUODENOSCOPY (EGD);  Surgeon: Kieth Brightly, Arta Bruce, MD;  Location: [facility];  Service: General;  Laterality: N/A;   ESOPHAGOGASTRODUODENOSCOPY N/A 08/16/2021   Procedure: ESOPHAGOGASTRODUODENOSCOPY (EGD);  Surgeon: Irving Copas., MD;  Location: [place];  Service: Gastroenterology;  Laterality: N/A;   ESOPHAGOGASTRODUODENOSCOPY (EGD) WITH PROPOFOL N/A 05/27/2021   Procedure: ESOPHAGOGASTRODUODENOSCOPY (EGD) WITH PROPOFOL;  Surgeon: Rush Landmark Telford Nab., MD;  [facility]: [facility];  Service: Gastroenterology;  Laterality: N/A;   ESOPHAGOGASTRODUODENOSCOPY (EGD) WITH PROPOFOL N/A 07/08/2021   Procedure: ESOPHAGOGASTRODUODENOSCOPY (EGD) WITH PROPOFOL;  Surgeon: Rush Landmark Telford Nab., MD;  Location: [facility];  Service: Gastroenterology;  Laterality: N/A;  fluoro   ESOPHAGOGASTRODUODENOSCOPY (EGD) WITH PROPOFOL N/A 08/14/2021   Procedure: ESOPHAGOGASTRODUODENOSCOPY (EGD) WITH PROPOFOL;  Surgeon: Rush Landmark Telford Nab., MD;  Location: [facility];  Service: Gastroenterology;  Laterality: N/A;  fluoro Axios stent (20 mm)   ESOPHAGOGASTRODUODENOSCOPY (EGD) WITH PROPOFOL N/A 10/16/2021   Procedure: ESOPHAGOGASTRODUODENOSCOPY (EGD) WITH PROPOFOL;  Surgeon: Rush Landmark Telford Nab., MD;  Location: [facility];  Service:  Gastroenterology;  Laterality: N/A;  axios stent pull fluoro   ESOPHAGOGASTRODUODENOSCOPY (EGD) WITH PROPOFOL N/A 11/18/2021   Procedure: ESOPHAGOGASTRODUODENOSCOPY (EGD) WITH PROPOFOL;  Surgeon: Rush Landmark Telford Nab., MD;  Location: [facility];  Service: Gastroenterology;  Laterality: N/A;   ESOPHAGOGASTRODUODENOSCOPY (EGD) WITH PROPOFOL N/A 01/23/2022   Procedure: ESOPHAGOGASTRODUODENOSCOPY (EGD) WITH PROPOFOL;  Surgeon: Rush Landmark Telford Nab., MD;  Location: [place];  Service: Gastroenterology;  Laterality: N/A;   FLEXIBLE SIGMOIDOSCOPY N/A 08/16/2021   Procedure: FLEXIBLE SIGMOIDOSCOPY;  Surgeon: Rush Landmark Telford Nab., MD;  Location: [place];  Service: Gastroenterology;  Laterality: N/A;   GASTRIC ROUX-EN-Y N/A 02/18/2021   Procedure: LAPAROSCOPIC ROUX-EN-Y GASTRIC BYPASS WITH UPPER ENDOSCOPY,;  Surgeon: Kieth Brightly, Arta Bruce, MD;  Location: [facility];  Service: General;  Laterality: N/A;   GASTROINTESTINAL STENT REMOVAL  01/23/2022   Procedure: Theora Master STENT REMOVAL;  Surgeon: Irving Copas., MD;  Location: [place];  Service: Gastroenterology;;   HEMORRHOID SURGERY  08/25/1997   IR [place] TUBE CHANGE  03/27/2021   LAPAROSCOPIC INSERTION GASTROSTOMY TUBE N/A 03/20/2021   Procedure: LAPAROSCOPIC INSERTION GASTROSTOMY TUBE;  Surgeon: Mickeal Skinner, MD;  Location: [facility];  Service: General;  Laterality: N/A;   LEFT HEART CATH AND CORONARY ANGIOGRAPHY N/A 04/15/2021   Procedure: LEFT HEART CATH AND CORONARY ANGIOGRAPHY;  Surgeon: Lorretta Harp, MD;  Location: [place] [facility];  Service: Cardiovascular;  Laterality:  N/A;   MOUTH SURGERY     removed area which was benign   STENT REMOVAL  07/08/2021   Procedure: STENT REMOVAL;  Surgeon: Lemar Lofty., MD;  Location: [facility];  Service: Gastroenterology;;   Francine Graven REMOVAL  10/16/2021   Procedure: STENT REMOVAL;  Surgeon: Lemar Lofty., MD;  Location: [facility];  Service: Gastroenterology;;    STERIOD INJECTION  01/23/2022   Procedure: STEROID INJECTION;  Surgeon: Lemar Lofty., MD;  Location: [facility];  Service: Gastroenterology;;   SUBMUCOSAL INJECTION  11/18/2021   Procedure: SUBMUCOSAL INJECTION;  Surgeon: Lemar Lofty., MD;  Location: [facility];  Service: Gastroenterology;;   TONSILLECTOMY     UPPER GI ENDOSCOPY N/A 03/20/2021   Procedure: UPPER GI ENDOSCOPY;  Surgeon: Sheliah Hatch De Blanch, MD;  Location: [facility];  Service: General;  Laterality: N/A;      Allergies as of 02/06/2022 - Review Complete 02/05/2022  Allergen Reaction Noted   Other Other (See Comments) 02/11/2021    Family History  Problem Relation Age of Onset   Hypertension Mother    Diabetes Mother    Heart attack Father 26   CAD Father    Hypertension Sister    Diabetes Other    Hypertension Other    Heart disease Other    Colon cancer Neg Hx    Esophageal cancer Neg Hx    Pancreatic cancer Neg Hx    Stomach cancer Neg Hx    Liver disease Neg Hx    Inflammatory bowel disease Neg Hx    Rectal cancer Neg Hx     Social History   Socioeconomic History   Marital status: Married    Spouse name: Not on file   Number of children: Not on file   Years of education: Not on file   Highest education level: Not on file  Occupational History   Not on file  Tobacco Use   Smoking status: Former    Packs/day: 1.00    Years: 15.00    Total pack years: 15.00    Types: Cigarettes    Quit date: 08/05/1989    Years since quitting: 32.5   Smokeless tobacco: Never  Vaping Use   Vaping Use: Never used  Substance and Sexual Activity   Alcohol use: Yes    Comment: occ   Drug use: Never   Sexual activity: Not on file  Other Topics Concern   Not on file  Social History Narrative   Not on file   Social Determinants of Health   Financial Resource Strain: Not on file  Food Insecurity: Not on file  Transportation Needs: Not on file  Physical Activity: Not on file   Stress: Not on file  Social Connections: Not on file  Intimate Partner Violence: Not on file     Physical Exam:  Constitutional: generally well-appearing Psychiatric: alert and oriented x3 Abdomen: soft, nontender, nondistended, no obvious ascites, no peritoneal signs, normal bowel sounds No peripheral edema noted in lower extremities  Assessment and plan: 61 y.o. male with with GJ anastomotic stricture  Repeat EGD today, likely dilation  Please see the "Patient Instructions" section for addition details about the plan.  Rob Bunting, MD [facility] 02/20/2022, 7:03 AM

## 2022-02-20 NOTE — Transfer of Care (Signed)
Immediate Anesthesia Transfer of Care Note  Patient: Hector Curry  Procedure(s) Performed: ESOPHAGOGASTRODUODENOSCOPY (EGD) WITH PROPOFOL FOREIGN BODY REMOVAL BALLOON DILATION  Patient Location: Endoscopy Unit  Anesthesia Type:MAC  Level of Consciousness: awake, alert , oriented and patient cooperative  Airway & Oxygen Therapy: Patient Spontanous Breathing  Post-op Assessment: Report given to RN and Post -op Vital signs reviewed and stable  Post vital signs: Reviewed and stable  Last Vitals:  Vitals Value Taken Time  BP 116/65 0750  Temp    Pulse 57   Resp 15   SpO2 97%     Last Pain:  Vitals:   02/20/22 0707  TempSrc: Temporal         Complications: No notable events documented.

## 2022-02-21 ENCOUNTER — Encounter (HOSPITAL_COMMUNITY): Payer: Self-pay | Admitting: Gastroenterology

## 2022-02-26 ENCOUNTER — Inpatient Hospital Stay (HOSPITAL_COMMUNITY): Payer: No Typology Code available for payment source | Attending: Hematology

## 2022-02-26 VITALS — BP 124/77 | HR 68 | Temp 98.3°F | Resp 18

## 2022-02-26 DIAGNOSIS — D508 Other iron deficiency anemias: Secondary | ICD-10-CM | POA: Diagnosis present

## 2022-02-26 MED ORDER — SODIUM CHLORIDE 0.9 % IV SOLN
300.0000 mg | Freq: Once | INTRAVENOUS | Status: AC
  Administered 2022-02-26: 300 mg via INTRAVENOUS
  Filled 2022-02-26: qty 10

## 2022-02-26 MED ORDER — SODIUM CHLORIDE 0.9 % IV SOLN: Freq: Once | INTRAVENOUS | Status: AC

## 2022-02-26 MED ORDER — LORATADINE 10 MG PO TABS
10.0000 mg | ORAL_TABLET | Freq: Once | ORAL | Status: AC
  Administered 2022-02-26: 10 mg via ORAL
  Filled 2022-02-26: qty 1

## 2022-02-26 MED ORDER — ACETAMINOPHEN 325 MG PO TABS
650.0000 mg | ORAL_TABLET | Freq: Once | ORAL | Status: AC
  Administered 2022-02-26: 650 mg via ORAL
  Filled 2022-02-26: qty 2

## 2022-02-26 NOTE — Progress Notes (Signed)
Patient presents today for iron infusion.  Patient is in satisfactory condition with no complaints voiced.  Vital signs are stable.  We will proceed with infusion per MD orders.   Patient tolerated iron infusion well with no complaints voiced.  Patient left ambulatory in stable condition.  Vital signs stable at discharge.  Follow up as scheduled.

## 2022-02-26 NOTE — Patient Instructions (Signed)
CANCER CENTER  Discharge Instructions: Thank you for choosing [facility] to provide your oncology and hematology care.  If you have a lab appointment with the Cancer Center, please come in thru the Main Entrance and check in at the main information desk.  Wear comfortable clothing and clothing appropriate for easy access to any Portacath or PICC line.   We strive to give you quality time with your provider. You may need to reschedule your appointment if you arrive late (15 or more minutes).  Arriving late affects you and other patients whose appointments are after yours.  Also, if you miss three or more appointments without notifying the office, you may be dismissed from the clinic at the provider's discretion.      For prescription refill requests, have your pharmacy contact our office and allow 72 hours for refills to be completed.    Iron Sucrose Injection What is this medication? IRON SUCROSE (EYE ern SOO krose) treats low levels of iron (iron deficiency anemia) in people with kidney disease. Iron is a mineral that plays an important role in making red blood cells, which carry oxygen from your lungs to the rest of your body. This medicine may be used for other purposes; ask your health care provider or pharmacist if you have questions. COMMON BRAND NAME(S): Venofer What should I tell my care team before I take this medication? They need to know if you have any of these conditions: Anemia not caused by low iron levels Heart disease High levels of iron in the blood Kidney disease Liver disease An unusual or allergic reaction to iron, other medications, foods, dyes, or preservatives Pregnant or trying to get pregnant Breast-feeding How should I use this medication? This medication is for infusion into a vein. It is given in a hospital or clinic setting. Talk to your care team about the use of this medication in children. While this medication may be prescribed  for children as young as 2 years for selected conditions, precautions do apply. Overdosage: If you think you have taken too much of this medicine contact a poison control center or emergency room at once. NOTE: This medicine is only for you. Do not share this medicine with others. What if I miss a dose? It is important not to miss your dose. Call your care team if you are unable to keep an appointment. What may interact with this medication? Do not take this medication with any of the following: Deferoxamine Dimercaprol Other iron products This medication may also interact with the following: Chloramphenicol Deferasirox This list may not describe all possible interactions. Give your health care provider a list of all the medicines, herbs, non-prescription drugs, or dietary supplements you use. Also tell them if you smoke, drink alcohol, or use illegal drugs. Some items may interact with your medicine. What should I watch for while using this medication? Visit your care team regularly. Tell your care team if your symptoms do not start to get better or if they get worse. You may need blood work done while you are taking this medication. You may need to follow a special diet. Talk to your care team. Foods that contain iron include: whole grains/cereals, dried fruits, beans, or peas, leafy green vegetables, and organ meats (liver, kidney). What side effects may I notice from receiving this medication? Side effects that you should report to your care team as soon as possible: Allergic reactions--skin rash, itching, hives, swelling of the face, lips, tongue,   or throat Low blood pressure--dizziness, feeling faint or lightheaded, blurry vision Shortness of breath Side effects that usually do not require medical attention (report to your care team if they continue or are bothersome): Flushing Headache Joint pain Muscle pain Nausea Pain, redness, or irritation at injection site This list may not  describe all possible side effects. Call your doctor for medical advice about side effects. You may report side effects to FDA at 1-800-FDA-1088. Where should I keep my medication? This medication is given in a hospital or clinic and will not be stored at home. NOTE: This sheet is a summary. It may not cover all possible information. If you have questions about this medicine, talk to your doctor, pharmacist, or health care provider.  2023 Elsevier/Gold Standard (2021-01-04 00:00:00)    To help prevent nausea and vomiting after your treatment, we encourage you to take your nausea medication as directed.  BELOW ARE SYMPTOMS THAT SHOULD BE REPORTED IMMEDIATELY: *FEVER GREATER THAN 100.4 F (38 C) OR HIGHER *CHILLS OR SWEATING *NAUSEA AND VOMITING THAT IS NOT CONTROLLED WITH YOUR NAUSEA MEDICATION *UNUSUAL SHORTNESS OF BREATH *UNUSUAL BRUISING OR BLEEDING *URINARY PROBLEMS (pain or burning when urinating, or frequent urination) *BOWEL PROBLEMS (unusual diarrhea, constipation, pain near the anus) TENDERNESS IN MOUTH AND THROAT WITH OR WITHOUT PRESENCE OF ULCERS (sore throat, sores in mouth, or a toothache) UNUSUAL RASH, SWELLING OR PAIN  UNUSUAL VAGINAL DISCHARGE OR ITCHING   Items with * indicate a potential emergency and should be followed up as soon as possible or go to the Emergency Department if any problems should occur.  Please show the CHEMOTHERAPY ALERT CARD or IMMUNOTHERAPY ALERT CARD at check-in to the Emergency Department and triage nurse.  Should you have questions after your visit or need to cancel or reschedule your appointment, please contact [facility] [phone]  and follow the prompts.  Office hours are 8:00 a.m. to 4:30 p.m. Monday - Friday. Please note that voicemails left after 4:00 p.m. may not be returned until the following business day.  We are closed weekends and major holidays. You have access to a nurse at all times for urgent questions. Please call  the main number to the clinic [phone] and follow the prompts.  For any non-urgent questions, you may also contact your provider using MyChart. We now offer e-Visits for anyone 18 and older to request care online for non-urgent symptoms. For details visit [email or web address].   Also download the MyChart app! Go to the app store, search "MyChart", open the app, select [facility], and log in with your MyChart username and password.  Masks are optional in the cancer centers. If you would like for your care team to wear a mask while they are taking care of you, please let them know. For doctor visits, patients may have with them one support person who is at least 60 years old. At this time, visitors are not allowed in the infusion area.  

## 2022-03-03 ENCOUNTER — Encounter: Payer: Self-pay | Admitting: Gastroenterology

## 2022-03-05 ENCOUNTER — Other Ambulatory Visit: Payer: Self-pay

## 2022-03-05 DIAGNOSIS — K9189 Other postprocedural complications and disorders of digestive system: Secondary | ICD-10-CM

## 2022-03-05 DIAGNOSIS — K311 Adult hypertrophic pyloric stenosis: Secondary | ICD-10-CM

## 2022-03-05 DIAGNOSIS — R1319 Other dysphagia: Secondary | ICD-10-CM

## 2022-03-05 NOTE — Telephone Encounter (Signed)
[name], Please place this patient on my hospital week (not 7/31 or 6/28).  EGD 1 hour case possible AXIOS stent versus repeat balloon dilation. Thanks. GM

## 2022-03-07 ENCOUNTER — Encounter: Payer: Self-pay | Admitting: Gastroenterology

## 2022-03-10 ENCOUNTER — Other Ambulatory Visit (HOSPITAL_COMMUNITY): Payer: Self-pay

## 2022-03-10 ENCOUNTER — Encounter: Payer: Self-pay | Admitting: Cardiovascular Disease

## 2022-03-13 ENCOUNTER — Other Ambulatory Visit (HOSPITAL_COMMUNITY)
Admission: RE | Admit: 2022-03-13 | Discharge: 2022-03-13 | Disposition: A | Discharge status: Home / Self Care | Payer: No Typology Code available for payment source | Source: Ambulatory Visit | Attending: Internal Medicine | Admitting: Internal Medicine

## 2022-03-13 DIAGNOSIS — E7800 Pure hypercholesterolemia, unspecified: Secondary | ICD-10-CM | POA: Insufficient documentation

## 2022-03-13 LAB — COMPREHENSIVE METABOLIC PANEL
ALT: 88 U/L — ABNORMAL HIGH (ref 0–44)
AST: 49 U/L — ABNORMAL HIGH (ref 15–41)
Albumin: 3.9 g/dL (ref 3.5–5.0)
Alkaline Phosphatase: 99 U/L (ref 38–126)
Anion gap: 7 (ref 5–15)
BUN: 6 mg/dL (ref 6–20)
CO2: 30 mmol/L (ref 22–32)
Calcium: 9 mg/dL (ref 8.9–10.3)
Chloride: 102 mmol/L (ref 98–111)
Creatinine, Ser: 0.53 mg/dL — ABNORMAL LOW (ref 0.61–1.24)
GFR, Estimated: >60 mL/min (ref >60)
Glucose, Bld: 143 mg/dL — ABNORMAL HIGH (ref 70–99)
Potassium: 4.5 mmol/L (ref 3.5–5.1)
Sodium: 139 mmol/L (ref 135–145)
Total Bilirubin: 0.9 mg/dL (ref 0.3–1.2)
Total Protein: 7 g/dL (ref 6.5–8.1)

## 2022-03-13 LAB — CBC WITH DIFFERENTIAL/PLATELET
Abs Immature Granulocytes: 0.01 10*3/uL (ref 0.00–0.07)
Basophils Absolute: 0 10*3/uL (ref 0.0–0.1)
Basophils Relative: 1 %
Eosinophils Absolute: 0.2 10*3/uL (ref 0.0–0.5)
Eosinophils Relative: 4 %
HCT: 45.7 % (ref 39.0–52.0)
Hemoglobin: 15.1 g/dL (ref 13.0–17.0)
Immature Granulocytes: 0 %
Lymphocytes Relative: 27 %
Lymphs Abs: 1.5 10*3/uL (ref 0.7–4.0)
MCH: 30.6 pg (ref 26.0–34.0)
MCHC: 33 g/dL (ref 30.0–36.0)
MCV: 92.7 fL (ref 80.0–100.0)
Monocytes Absolute: 0.5 10*3/uL (ref 0.1–1.0)
Monocytes Relative: 9 %
Neutro Abs: 3.5 10*3/uL (ref 1.7–7.7)
Neutrophils Relative %: 59 %
Platelets: 147 10*3/uL — ABNORMAL LOW (ref 150–400)
RBC: 4.93 MIL/uL (ref 4.22–5.81)
RDW: 13.7 % (ref 11.5–15.5)
WBC: 5.8 10*3/uL (ref 4.0–10.5)
nRBC: 0 % (ref 0.0–0.2)

## 2022-03-13 LAB — LIPID PANEL
Cholesterol: 92 mg/dL (ref 0–200)
HDL: 28 mg/dL — ABNORMAL LOW (ref >40)
LDL Cholesterol: 49 mg/dL (ref 0–99)
Total CHOL/HDL Ratio: 3.3 RATIO
Triglycerides: 77 mg/dL (ref <150)
VLDL: 15 mg/dL (ref 0–40)

## 2022-03-13 LAB — HEMOGLOBIN A1C
Hgb A1c MFr Bld: 6.3 % — ABNORMAL HIGH (ref 4.8–5.6)
Mean Plasma Glucose: 134.11 mg/dL

## 2022-03-14 LAB — THYROID STIMULATING IMMUNOGLOBULIN: Thyroid Stimulating Immunoglob: <0.1 IU/L (ref 0.00–0.55)

## 2022-03-20 ENCOUNTER — Encounter (HOSPITAL_COMMUNITY): Payer: Self-pay | Admitting: Gastroenterology

## 2022-03-20 LAB — URINALYSIS, ROUTINE W REFLEX MICROSCOPIC
Bilirubin Urine: NEGATIVE
Glucose, UA: NEGATIVE mg/dL
Hgb urine dipstick: NEGATIVE
Ketones, ur: NEGATIVE mg/dL
Leukocytes,Ua: NEGATIVE
Nitrite: NEGATIVE
Protein, ur: NEGATIVE mg/dL
Specific Gravity, Urine: 1.021 (ref 1.005–1.030)
pH: 6 (ref 5.0–8.0)

## 2022-03-20 NOTE — Progress Notes (Signed)
Attempted to obtain medical history via telephone, unable to reach at this time. HIPAA compliant voicemail message left requesting return call to pre surgical testing department. 

## 2022-03-24 ENCOUNTER — Other Ambulatory Visit (HOSPITAL_COMMUNITY): Payer: Self-pay

## 2022-03-24 ENCOUNTER — Other Ambulatory Visit: Payer: Self-pay | Admitting: Cardiovascular Disease

## 2022-03-24 MED ORDER — PROPRANOLOL HCL 10 MG PO TABS
ORAL_TABLET | ORAL | 1 refills | Status: DC
  Filled 2022-03-24: qty 60, 10d supply, fill #0
  Filled 2022-04-10: qty 60, 10d supply, fill #1

## 2022-03-26 ENCOUNTER — Other Ambulatory Visit (HOSPITAL_COMMUNITY): Payer: Self-pay

## 2022-03-26 NOTE — Anesthesia Preprocedure Evaluation (Addendum)
Anesthesia Evaluation  Patient identified by MRN, date of birth, ID band Patient awake    Reviewed: Allergy & Precautions, NPO status , Patient's Chart, lab work & pertinent test results  History of Anesthesia Complications Negative for: history of anesthetic complications  Airway Mallampati: III  TM Distance: >3 FB Neck ROM: Full    Dental  (+) Teeth Intact   Pulmonary sleep apnea and Continuous Positive Airway Pressure Ventilation , former smoker,    Pulmonary exam normal        Cardiovascular hypertension, + CAD and + Cardiac Stents  Normal cardiovascular exam+ dysrhythmias Supra Ventricular Tachycardia      Neuro/Psych negative neurological ROS     GI/Hepatic Neg liver ROS, GERD  ,H/o REY bypass with subsequent GJ anastomotic stricture s/p multiple dilations and stent placements   Endo/Other  diabetesMorbid obesity  Renal/GU negative Renal ROS  negative genitourinary   Musculoskeletal  (+) Arthritis ,   Abdominal   Peds  Hematology negative hematology ROS (+)   Anesthesia Other Findings   Reproductive/Obstetrics                           Anesthesia Physical Anesthesia Plan  ASA: 3  Anesthesia Plan: MAC   Post-op Pain Management: Minimal or no pain anticipated   Induction: Intravenous  PONV Risk Score and Plan: 1 and Propofol infusion, TIVA and Treatment may vary due to age or medical condition  Airway Management Planned: Natural Airway, Nasal Cannula and Simple Face Mask  Additional Equipment: None  Intra-op Plan:   Post-operative Plan:   Informed Consent: I have reviewed the patients History and Physical, chart, labs and discussed the procedure including the risks, benefits and alternatives for the proposed anesthesia with the patient or authorized representative who has indicated his/her understanding and acceptance.       Plan Discussed with:   Anesthesia Plan  Comments: (Patient still symptomatic from his obstruction but has been on liquids only for several weeks. He has had multiple deep sedation anesthetics recently without issues. Discussed with patient the increased risk of aspiration with sedation because of his obstruction, but overall with his diet I believe the risk to be low. Will proceed with sedation, patient agrees. )       Anesthesia Quick Evaluation

## 2022-03-27 ENCOUNTER — Ambulatory Visit (HOSPITAL_COMMUNITY): Payer: No Typology Code available for payment source | Admitting: Anesthesiology

## 2022-03-27 ENCOUNTER — Other Ambulatory Visit (HOSPITAL_COMMUNITY): Payer: Self-pay

## 2022-03-27 ENCOUNTER — Ambulatory Visit (HOSPITAL_COMMUNITY)
Admission: RE | Admit: 2022-03-27 | Discharge: 2022-03-27 | Disposition: A | Discharge status: Home / Self Care | Payer: No Typology Code available for payment source | Attending: Gastroenterology | Admitting: Gastroenterology

## 2022-03-27 ENCOUNTER — Telehealth: Payer: Self-pay

## 2022-03-27 ENCOUNTER — Encounter (HOSPITAL_COMMUNITY)
Admission: RE | Disposition: A | Discharge status: Home / Self Care | Payer: Self-pay | Source: Home / Self Care | Attending: Gastroenterology

## 2022-03-27 ENCOUNTER — Encounter (HOSPITAL_COMMUNITY): Payer: Self-pay | Admitting: Gastroenterology

## 2022-03-27 ENCOUNTER — Other Ambulatory Visit: Payer: Self-pay

## 2022-03-27 ENCOUNTER — Ambulatory Visit (HOSPITAL_BASED_OUTPATIENT_CLINIC_OR_DEPARTMENT_OTHER): Payer: No Typology Code available for payment source | Admitting: Anesthesiology

## 2022-03-27 DIAGNOSIS — I2510 Atherosclerotic heart disease of native coronary artery without angina pectoris: Secondary | ICD-10-CM

## 2022-03-27 DIAGNOSIS — I10 Essential (primary) hypertension: Secondary | ICD-10-CM

## 2022-03-27 DIAGNOSIS — K3189 Other diseases of stomach and duodenum: Secondary | ICD-10-CM | POA: Diagnosis not present

## 2022-03-27 DIAGNOSIS — K9589 Other complications of other bariatric procedure: Secondary | ICD-10-CM

## 2022-03-27 DIAGNOSIS — R1310 Dysphagia, unspecified: Secondary | ICD-10-CM | POA: Diagnosis present

## 2022-03-27 DIAGNOSIS — G4733 Obstructive sleep apnea (adult) (pediatric): Secondary | ICD-10-CM | POA: Insufficient documentation

## 2022-03-27 DIAGNOSIS — Z980 Intestinal bypass and anastomosis status: Secondary | ICD-10-CM | POA: Diagnosis not present

## 2022-03-27 DIAGNOSIS — Z6827 Body mass index (BMI) 27.0-27.9, adult: Secondary | ICD-10-CM | POA: Insufficient documentation

## 2022-03-27 DIAGNOSIS — Z955 Presence of coronary angioplasty implant and graft: Secondary | ICD-10-CM | POA: Insufficient documentation

## 2022-03-27 DIAGNOSIS — Z9884 Bariatric surgery status: Secondary | ICD-10-CM | POA: Insufficient documentation

## 2022-03-27 DIAGNOSIS — E119 Type 2 diabetes mellitus without complications: Secondary | ICD-10-CM | POA: Insufficient documentation

## 2022-03-27 HISTORY — PX: DUODENAL STENT PLACEMENT: SHX5541

## 2022-03-27 HISTORY — PX: BALLOON DILATION: SHX5330

## 2022-03-27 HISTORY — PX: ESOPHAGOGASTRODUODENOSCOPY (EGD) WITH PROPOFOL: SHX5813

## 2022-03-27 HISTORY — PX: FOREIGN BODY REMOVAL ESOPHAGEAL: SHX5322

## 2022-03-27 LAB — GLUCOSE, CAPILLARY
Glucose-Capillary: 117 mg/dL — ABNORMAL HIGH (ref 70–99)
Glucose-Capillary: 116 mg/dL — ABNORMAL HIGH (ref 70–99)

## 2022-03-27 SURGERY — ESOPHAGOGASTRODUODENOSCOPY (EGD) WITH PROPOFOL
Anesthesia: Monitor Anesthesia Care

## 2022-03-27 MED ORDER — LACTATED RINGERS IV SOLN: INTRAVENOUS | Status: DC | PRN

## 2022-03-27 MED ORDER — ONDANSETRON HCL 4 MG/2ML IJ SOLN
INTRAMUSCULAR | Status: DC | PRN
  Administered 2022-03-27: 4 mg via INTRAVENOUS

## 2022-03-27 MED ORDER — CLOPIDOGREL BISULFATE 75 MG PO TABS: 75.0000 mg | ORAL_TABLET | Freq: Every day | ORAL | 2 refills | Status: DC

## 2022-03-27 MED ORDER — SODIUM CHLORIDE 0.9 % IV SOLN: INTRAVENOUS | Status: DC | PRN

## 2022-03-27 MED ORDER — LIDOCAINE 2% (20 MG/ML) 5 ML SYRINGE
INTRAMUSCULAR | Status: DC | PRN
  Administered 2022-03-27: 100 mg via INTRAVENOUS

## 2022-03-27 MED ORDER — PROPOFOL 10 MG/ML IV BOLUS
INTRAVENOUS | Status: DC | PRN
  Administered 2022-03-27: 30 mg via INTRAVENOUS
  Administered 2022-03-27: 20 mg via INTRAVENOUS
  Administered 2022-03-27: 50 mg via INTRAVENOUS

## 2022-03-27 MED ORDER — PROPOFOL 500 MG/50ML IV EMUL
INTRAVENOUS | Status: DC | PRN
  Administered 2022-03-27: 100 ug/kg/min via INTRAVENOUS

## 2022-03-27 SURGICAL SUPPLY — 15 items

## 2022-03-27 NOTE — Telephone Encounter (Signed)
-----   Message from Lemar Lofty., MD sent at 03/27/2022  8:29 AM EDT ----- Regarding: Follow up AXIOS Hector Curry, Patient needs EGD with AXIOS stent removal in 2 months.  Thanks. GM

## 2022-03-27 NOTE — Telephone Encounter (Signed)
Recall has been entered for 2 month EGD

## 2022-03-27 NOTE — Anesthesia Postprocedure Evaluation (Signed)
Anesthesia Post Note  Patient: Hector Curry  Procedure(s) Performed: ESOPHAGOGASTRODUODENOSCOPY (EGD) WITH PROPOFOL REMOVAL FOREIGN BODY gastric GASTRIC STENT PLACEMENT BALLOON DILATION     Patient location during evaluation: Endoscopy Anesthesia Type: MAC Level of consciousness: awake and alert Pain management: pain level controlled Vital Signs Assessment: post-procedure vital signs reviewed and stable Respiratory status: spontaneous breathing, nonlabored ventilation and respiratory function stable Cardiovascular status: blood pressure returned to baseline and stable Postop Assessment: no apparent nausea or vomiting Anesthetic complications: no   No notable events documented.  Last Vitals:  Vitals:   03/27/22 0815 03/27/22 0830  BP: 120/73 120/81  Pulse: (!) 59 (!) 51  Resp: 16 14  Temp: 37.1 C   SpO2: 97% 96%    Last Pain:  Vitals:   03/27/22 0642  TempSrc: Temporal  PainSc: 0-No pain                 Lidia Collum

## 2022-03-27 NOTE — Transfer of Care (Signed)
Immediate Anesthesia Transfer of Care Note  Patient: LYON DUMONT  Procedure(s) Performed: ESOPHAGOGASTRODUODENOSCOPY (EGD) WITH PROPOFOL REMOVAL FOREIGN BODY gastric GASTRIC STENT PLACEMENT BALLOON DILATION  Patient Location: PACU  Anesthesia Type:General  Level of Consciousness: awake and alert   Airway & Oxygen Therapy: Patient Spontanous Breathing and Patient connected to nasal cannula oxygen  Post-op Assessment: Report given to RN and Post -op Vital signs reviewed and stable  Post vital signs: Reviewed and stable  Last Vitals:  Vitals Value Taken Time  BP 120/73 03/27/22 0816  Temp 37.1 C 03/27/22 0815  Pulse 56 03/27/22 0819  Resp 14 03/27/22 0819  SpO2 97 % 03/27/22 0819  Vitals shown include unvalidated device data.  Last Pain:  Vitals:   03/27/22 0642  TempSrc: Temporal  PainSc: 0-No pain         Complications: No notable events documented.

## 2022-03-27 NOTE — H&P (Signed)
GASTROENTEROLOGY PROCEDURE H&P NOTE   Primary Care Physician: Marguarite Arbour, MD  HPI: Hector Curry is a 61 y.o. male who presents for EGD evaluation of GJ anastomosis stricture.  Past Medical History:  Diagnosis Date   Allergic rhinitis    Atrial tachycardia (HCC)    CAD (coronary artery disease)    a. 05/08/2015 Cath: no significant CAD, LVEF nl-->Med; b. 07/2017 MV: attenuation artifact, no ischemia, EF 65%-->Low risk; c. 03/2021 NSTEMI/PCI: LM nl, LAD 80p (3.0x18 Onyx Frontier DES), D1 90 (too small for PCI), LCX nl, RCA nl.   Diabetes mellitus without complication (HCC)    Diastolic dysfunction    a. 04/2015 Echo: EF 60-65%; b. 05/2016 Echo: EF 60-65%, GrI DD; c. 07/2017 Echo: EF 55-60%, Ao root 48mm; d. 03/2019 Echo: EF 55-60%, Ao root/Asc Ao 19mm; e. 03/2021 Echo: EF 50-55%, apical HK, mod asymm basal-septal hypertrophy. Nl RV fxn. Asc Ao 3mm.   Dilated aortic root (HCC)    a. 07/2017 Echo: 75mm Ao root - mildly dil; b. 03/2019 Echo: Ao root 50mm; c. 03/2021 Echo: Asc Ao 51mm.   Diverticulosis 10/03/2014   Dysrhythmia    Esophageal stricture    a.  In setting of lap band June 2022-stricture at the GJ anastomosis requiring gastrostomy tube; b. 05/2021 s/p esoph stenting (53mm); c. 07/2021 s/p esoph stenting (80mm).   Family history of premature CAD    a. father passed from MI at 38   GERD (gastroesophageal reflux disease)    GIB (gastrointestinal bleeding)    a. 07/2021 following esoph stenting.   Hemorrhoids    a. internal hemorrhoids s/p surgery 1999   History of kidney stones    History of tobacco abuse    Hyperlipidemia    Hyperplastic colon polyp 10/03/2014   a. x 2    Hypertension    Inflammatory arthritis    a. CCP antibodies & x-rays negative. Rheumatoid factor 14, felt to be crystaline over RA or psoriatic   Iron deficiency anemia 02/05/2022   Morbid obesity (HCC)    a. s/p LAP-BAND - complicated by esoph stricture.   OSA (obstructive sleep apnea)    a.  on CPAP   Osteoarthritis    PSVT (paroxysmal supraventricular tachycardia) (HCC)    a. 48 hr Holter 04/2015: NSR w/ rare PVC, short runs of narrow complex tachycardiac, possible atrial tach, longest run 7 beats, PACs noted (2% of all beats 3600 total) they did not seem to correlate w/ significant arrythmia; b. 08/2017 Event monitor: no significant arrhythmias.   Past Surgical History:  Procedure Laterality Date   BALLOON DILATION N/A 08/14/2021   Procedure: BALLOON DILATION;  Surgeon: Mansouraty, Netty Starring., MD;  Location: [facility];  Service: Gastroenterology;  Laterality: N/A;   BALLOON DILATION N/A 02/20/2022   Procedure: BALLOON DILATION;  Surgeon: Rachael Fee, MD;  Location: [facility];  Service: Gastroenterology;  Laterality: N/A;   BARIATRIC SURGERY  01/2021   lap band    BILIARY STENT PLACEMENT N/A 08/14/2021   Procedure: AXIOS STENT PLACEMENT;  Surgeon: Lemar Lofty., MD;  Location: [facility];  Service: Gastroenterology;  Laterality: N/A;   BIOPSY  07/08/2021   Procedure: BIOPSY;  Surgeon: Meridee Score Netty Starring., MD;  Location: [facility];  Service: Gastroenterology;;   BIOPSY  08/14/2021   Procedure: BIOPSY;  Surgeon: Lemar Lofty., MD;  Location: [facility];  Service: Gastroenterology;;   CARDIAC CATHETERIZATION N/A 05/08/2015   Procedure: Left Heart Cath and Coronary Angiography;  Surgeon: Renelda Loma  Hoyle Barr, MD;  Location: [facility];  Service: Cardiovascular;  Laterality: N/A;   CARPAL TUNNEL RELEASE Left    CHOLECYSTECTOMY     COLONOSCOPY  06/27/2005   COLONOSCOPY  10/03/2014   CORONARY STENT INTERVENTION N/A 04/15/2021   Procedure: CORONARY STENT INTERVENTION;  Surgeon: Runell Gess, MD;  Location: [facility];  Service: Cardiovascular;  Laterality: N/A;   DUODENAL STENT PLACEMENT N/A 11/18/2021   Procedure: DUODENAL STENT PLACEMENT;  Surgeon: Meridee Score Netty Starring., MD;  Location: [facility];  Service:  Gastroenterology;  Laterality: N/A;  axios placed at GJ anastamosis   ESOPHAGEAL DILATION  05/27/2021   Procedure: ESOPHAGEAL DILATION;  Surgeon: Meridee Score Netty Starring., MD;  Location: [facility];  Service: Gastroenterology;;   ESOPHAGEAL DILATION  01/23/2022   Procedure: ESOPHAGEAL DILATION;  Surgeon: Lemar Lofty., MD;  Location: [facility];  Service: Gastroenterology;;   ESOPHAGEAL STENT PLACEMENT N/A 05/27/2021   Procedure: ESOPHAGEAL STENT PLACEMENT;  Surgeon: Lemar Lofty., MD;  Location: [facility];  Service: Gastroenterology;  Laterality: N/A;   ESOPHAGOGASTRODUODENOSCOPY  06/27/2005   ESOPHAGOGASTRODUODENOSCOPY N/A 03/19/2021   Procedure: ESOPHAGOGASTRODUODENOSCOPY (EGD);  Surgeon: Sheliah Hatch, De Blanch, MD;  Location: [facility];  Service: General;  Laterality: N/A;   ESOPHAGOGASTRODUODENOSCOPY N/A 08/16/2021   Procedure: ESOPHAGOGASTRODUODENOSCOPY (EGD);  Surgeon: Lemar Lofty., MD;  Location: [facility];  Service: Gastroenterology;  Laterality: N/A;   ESOPHAGOGASTRODUODENOSCOPY (EGD) WITH PROPOFOL N/A 05/27/2021   Procedure: ESOPHAGOGASTRODUODENOSCOPY (EGD) WITH PROPOFOL;  Surgeon: Meridee Score Netty Starring., MD;  Location: [facility];  Service: Gastroenterology;  Laterality: N/A;   ESOPHAGOGASTRODUODENOSCOPY (EGD) WITH PROPOFOL N/A 07/08/2021   Procedure: ESOPHAGOGASTRODUODENOSCOPY (EGD) WITH PROPOFOL;  Surgeon: Meridee Score Netty Starring., MD;  Location: [facility];  Service: Gastroenterology;  Laterality: N/A;  fluoro   ESOPHAGOGASTRODUODENOSCOPY (EGD) WITH PROPOFOL N/A 08/14/2021   Procedure: ESOPHAGOGASTRODUODENOSCOPY (EGD) WITH PROPOFOL;  Surgeon: Meridee Score Netty Starring., MD;  Location: [facility];  Service: Gastroenterology;  Laterality: N/A;  fluoro Axios stent (20 mm)   ESOPHAGOGASTRODUODENOSCOPY (EGD) WITH PROPOFOL N/A 10/16/2021   Procedure: ESOPHAGOGASTRODUODENOSCOPY (EGD) WITH PROPOFOL;  Surgeon: Meridee Score Netty Starring., MD;  Location:  [facility];  Service: Gastroenterology;  Laterality: N/A;  axios stent pull fluoro   ESOPHAGOGASTRODUODENOSCOPY (EGD) WITH PROPOFOL N/A 11/18/2021   Procedure: ESOPHAGOGASTRODUODENOSCOPY (EGD) WITH PROPOFOL;  Surgeon: Meridee Score Netty Starring., MD;  Location: [facility];  Service: Gastroenterology;  Laterality: N/A;   ESOPHAGOGASTRODUODENOSCOPY (EGD) WITH PROPOFOL N/A 01/23/2022   Procedure: ESOPHAGOGASTRODUODENOSCOPY (EGD) WITH PROPOFOL;  Surgeon: Meridee Score Netty Starring., MD;  Location: [facility] ENDOSCOPY;  Service: Gastroenterology;  Laterality: N/A;   ESOPHAGOGASTRODUODENOSCOPY (EGD) WITH PROPOFOL N/A 02/20/2022   Procedure: ESOPHAGOGASTRODUODENOSCOPY (EGD) WITH PROPOFOL;  Surgeon: Rachael Fee, MD;  Location: [facility];  Service: Gastroenterology;  Laterality: N/A;   FLEXIBLE SIGMOIDOSCOPY N/A 08/16/2021   Procedure: FLEXIBLE SIGMOIDOSCOPY;  Surgeon: Meridee Score Netty Starring., MD;  Location: [facility];  Service: Gastroenterology;  Laterality: N/A;   FOREIGN BODY REMOVAL  02/20/2022   Procedure: FOREIGN BODY REMOVAL;  Surgeon: Rachael Fee, MD;  Location: [facility];  Service: Gastroenterology;;   GASTRIC ROUX-EN-Y N/A 02/18/2021   Procedure: LAPAROSCOPIC ROUX-EN-Y GASTRIC BYPASS WITH UPPER ENDOSCOPY,;  Surgeon: Sheliah Hatch De Blanch, MD;  Location: [facility];  Service: General;  Laterality: N/A;   GASTROINTESTINAL STENT REMOVAL  01/23/2022   Procedure: Daine Gip STENT REMOVAL;  Surgeon: Lemar Lofty., MD;  Location: [facility] ENDOSCOPY;  Service: Gastroenterology;;   HEMORRHOID SURGERY  08/25/1997   IR GJ TUBE CHANGE  03/27/2021   LAPAROSCOPIC INSERTION GASTROSTOMY TUBE  N/A 03/20/2021   Procedure: LAPAROSCOPIC INSERTION GASTROSTOMY TUBE;  Surgeon: Sheliah Hatch De Blanch, MD;  Location: [facility];  Service: General;  Laterality: N/A;   LEFT HEART CATH AND CORONARY ANGIOGRAPHY N/A 04/15/2021   Procedure: LEFT HEART CATH AND CORONARY ANGIOGRAPHY;  Surgeon: Runell Gess, MD;  Location: [facility];  Service: Cardiovascular;  Laterality: N/A;   MOUTH SURGERY     removed area which was benign   STENT REMOVAL  07/08/2021   Procedure: STENT REMOVAL;  Surgeon: Lemar Lofty., MD;  Location: [facility];  Service: Gastroenterology;;   Francine Graven REMOVAL  10/16/2021   Procedure: STENT REMOVAL;  Surgeon: Lemar Lofty., MD;  Location: [facility];  Service: Gastroenterology;;   Karna Dupes INJECTION  01/23/2022   Procedure: STEROID INJECTION;  Surgeon: Lemar Lofty., MD;  Location: [facility] END[facility]COPY;  Service: Gastroenterology;;   Sunnie Nielsen INJECTION  11/18/2021   Procedure: SUBMUCOSAL INJECTION;  Surgeon: Lemar Lofty., MD;  Location: [facility];  Service: Gastroenterology;;   TONSILLECTOMY     UPPER GI ENDOSCOPY N/A 03/20/2021   Procedure: UPPER GI ENDOSCOPY;  Surgeon: Sheliah Hatch De Blanch, MD;  Location: [facility];  Service: General;  Laterality: N/A;   No current facility-administered medications for this encounter.   No current facility-administered medications for this encounter. Allergies  Allergen Reactions   Other Other (See Comments)    Cats   Family History  Problem Relation Age of Onset   Hypertension Mother    Diabetes Mother    Heart attack Father 38   CAD Father    Hypertension Sister    Diabetes Other    Hypertension Other    Heart disease Other    Colon cancer Neg Hx    Esophageal cancer Neg Hx    Pancreatic cancer Neg Hx    Stomach cancer Neg Hx    Liver disease Neg Hx    Inflammatory bowel disease Neg Hx    Rectal cancer Neg Hx    Social History   Socioeconomic History   Marital status: Married    Spouse name: Not on file   Number of children: Not on file   Years of education: Not on file   Highest education level: Not on file  Occupational History   Not on file  Tobacco Use   Smoking status: Former    Packs/day: 1.00    Years: 15.00    Total pack years: 15.00    Types: Cigarettes    Quit date:  08/05/1989    Years since quitting: 32.6   Smokeless tobacco: Never  Vaping Use   Vaping Use: Never used  Substance and Sexual Activity   Alcohol use: Yes    Comment: occ   Drug use: Never   Sexual activity: Not on file  Other Topics Concern   Not on file  Social History Narrative   Not on file   Social Determinants of Health   Financial Resource Strain: Not on file  Food Insecurity: Not on file  Transportation Needs: Not on file  Physical Activity: Not on file  Stress: Not on file  Social Connections: Not on file  Intimate Partner Violence: Not on file    Physical Exam: Today's Vitals   03/27/22 0642 03/27/22 0645  BP:  134/82  Pulse: (!) 56   Resp: 16   Temp: 97.6 F (36.4 C)   TempSrc: Temporal   SpO2: 95%   Weight: 97.5 kg   Height: 6\' 2"  (1.88 m)  PainSc: 0-No pain    Body mass index is 27.6 kg/m. GEN: NAD EYE: Sclerae anicteric ENT: MMM CV: Non-tachycardic GI: Soft, NT/ND NEURO:  Alert & Oriented x 3  Lab Results: No results for input(s): "WBC", "HGB", "HCT", "PLT" in the last 72 hours. BMET No results for input(s): "NA", "K", "CL", "CO2", "GLUCOSE", "BUN", "CREATININE", "CALCIUM" in the last 72 hours. LFT No results for input(s): "PROT", "ALBUMIN", "AST", "ALT", "ALKPHOS", "BILITOT", "BILIDIR", "IBILI" in the last 72 hours. PT/INR No results for input(s): "LABPROT", "INR" in the last 72 hours.   Impression / Plan: This is a 61 y.o.male who presents for EGD evaluation of GJ anastomosis stricture.  The risks and benefits of endoscopic evaluation/treatment were discussed with the patient and/or family; these include but are not limited to the risk of perforation, infection, bleeding, missed lesions, lack of diagnosis, severe illness requiring hospitalization, as well as anesthesia and sedation related illnesses.  The patient's history has been reviewed, patient examined, no change in status, and deemed stable for procedure.  The patient and/or family  is agreeable to proceed.    Corliss Parish, MD [facility] Office # [phone]

## 2022-03-27 NOTE — Op Note (Signed)
[facility] Patient Name: Hector Curry Procedure Date : 03/27/2022 MRN: [id number] Attending MD: Justice Britain , MD Date of Birth: February 01, 1961 CSN: [id number] Age: 61 Admit Type: Outpatient Procedure:                Upper GI endoscopy Indications:              Dysphagia, Failure to respond to medical treatment,                            Post-bariatric anastomotic stenosis, Follow-up of                            post-bariatric anastomotic stenosis Providers:                Justice Britain, MD, Burtis Junes, RN, Luan Moore, Technician Referring MD:             Arta Bruce Kinsinger MD, MD Medicines:                Monitored Anesthesia Care Complications:            No immediate complications. Estimated Blood Loss:     Estimated blood loss was minimal. Procedure:                Pre-Anesthesia Assessment:                           - Prior to the procedure, a History and Physical                            was performed, and patient medications and                            allergies were reviewed. The patient's tolerance of                            previous anesthesia was also reviewed. The risks                            and benefits of the procedure and the sedation                            options and risks were discussed with the patient.                            All questions were answered, and informed consent                            was obtained. Prior Anticoagulants: The patient                            last took Plavix (clopidogrel) 5 days prior to the  procedure and has taken no previous anticoagulant                            or antiplatelet agents except for aspirin. ASA                            Grade Assessment: III - A patient with severe                            systemic disease. After reviewing the risks and                            benefits, the patient was deemed in  satisfactory                            condition to undergo the procedure.                           After obtaining informed consent, the endoscope was                            passed under direct vision. Throughout the                            procedure, the patient's blood pressure, pulse, and                            oxygen saturations were monitored continuously. The                            GIF-H190 ([id number]) Olympus endoscope was introduced                            through the mouth, and advanced to the gastric                            cardia. The GIF-[id number] ([id number]) Therapeutic                            endoscope was introduced through the mouth, and                            advanced to the jejunum. The upper GI endoscopy was                            accomplished without difficulty. The patient                            tolerated the procedure. Scope In: Scope Out: Findings:      No gross lesions were noted in the entire esophagus.      3 staples were removed from the area of gastric pouch (2 proximal to the       anastomosis and 1 at the anastomosis stricture).      A benign-appearing,   intrinsic severe stenosis was found at the       anastomosis. This was non-traversed. It probably 4 mm in length       currently. As I could see beyond this region, and I have previous       stented and had dilations, I pursued attempt at endoscopic dilation. He       has failed balloon dilations and AXIOS stentings (only after the stent       is removed). This was stented with an AXIOS 20 mm x 10 mm. To allow for       the stent to expand, a TTS dilator was passed through the scope.       Dilation with an 04-02-09 mm anastomotic balloon dilator was performed (it       will expand further over the next week). Impression:               - No gross lesions in esophagus.                           - 3 staples noted and removed.                           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                            Prosthesis placed. Dilated. Recommendation:           - The patient will be observed post-procedure,                            until all discharge criteria are met.                           - Discharge patient to home.                           - Advance diet as tolerated.                           - Restart Plavix in 48 hours. Can continue Aspirin                            for now.                           - Continue present medications.                           - Repeat upper endoscopy in 2 months per protocol.                           - Follow up with Bariatric surgery at [facility] in interim if other management options are  available.                           - Consider UGIS in 3-weeks to see patency and                            overall anatomy for future surgical planning                            purposes.                           - While at [facility], could ask if [facility]                            would consider Stricturotomy as a means of                            endoscopic maintenance of this, but I think overall                            issues over last year suggest surgical needs                            probably are necessary at this point.                           - The findings and recommendations were discussed                            with the patient.                           - The findings and recommendations were discussed                            with the patient's family. Procedure Code(s):        --- Professional ---                           43266, Esophagogastroduodenoscopy, flexible,                            transoral; with placement of endoscopic stent                            (includes pre- and post-dilation and guide wire                            passage, when performed) Diagnosis Code(s):        --- Professional  ---                           K31.89, Other diseases of stomach and duodenum                             R13.10, Dysphagia, unspecified                           M22.63, Other complications of other bariatric                            procedure CPT copyright 2019 [employer]. All rights reserved. The codes documented in this report are preliminary and upon coder review may  be revised to meet current compliance requirements. Justice Britain, MD 03/27/2022 8:30:08 AM Number of Addenda: 0

## 2022-03-28 ENCOUNTER — Other Ambulatory Visit (HOSPITAL_COMMUNITY): Payer: Self-pay

## 2022-03-28 ENCOUNTER — Telehealth: Payer: Self-pay | Admitting: Gastroenterology

## 2022-03-28 ENCOUNTER — Other Ambulatory Visit: Payer: Self-pay | Admitting: Gastroenterology

## 2022-03-28 MED ORDER — ONDANSETRON 4 MG PO TBDP
4.0000 mg | ORAL_TABLET | Freq: Four times a day (QID) | ORAL | 0 refills | Status: DC | PRN
  Filled 2022-03-28: qty 20, 5d supply, fill #0

## 2022-03-28 NOTE — Telephone Encounter (Signed)
The pt has been advised that zofran was sent to the pharmacy today per GM.  The pt has been advised of the information and verbalized understanding.

## 2022-03-28 NOTE — Telephone Encounter (Signed)
PT had Egd done yesterday and is extremely nauseated. Was given Zofran and wants to have it refilled and sent to [facility]. Please reach out to advise. Thank you.

## 2022-03-29 ENCOUNTER — Encounter (HOSPITAL_COMMUNITY): Payer: Self-pay | Admitting: Gastroenterology

## 2022-03-31 ENCOUNTER — Other Ambulatory Visit: Payer: Self-pay

## 2022-03-31 ENCOUNTER — Encounter: Payer: Self-pay | Admitting: Gastroenterology

## 2022-03-31 DIAGNOSIS — R1319 Other dysphagia: Secondary | ICD-10-CM

## 2022-03-31 DIAGNOSIS — R079 Chest pain, unspecified: Secondary | ICD-10-CM

## 2022-03-31 DIAGNOSIS — R109 Unspecified abdominal pain: Secondary | ICD-10-CM

## 2022-03-31 DIAGNOSIS — K311 Adult hypertrophic pyloric stenosis: Secondary | ICD-10-CM

## 2022-03-31 DIAGNOSIS — R197 Diarrhea, unspecified: Secondary | ICD-10-CM

## 2022-03-31 DIAGNOSIS — K9189 Other postprocedural complications and disorders of digestive system: Secondary | ICD-10-CM

## 2022-04-01 ENCOUNTER — Other Ambulatory Visit (HOSPITAL_COMMUNITY): Payer: Self-pay

## 2022-04-01 NOTE — Addendum Note (Signed)
Addended by: Loretha Stapler on: 04/01/2022 12:31 PM   Modules accepted: Orders

## 2022-04-01 NOTE — Telephone Encounter (Signed)
See the note from the pt.  He is feeling better.

## 2022-04-01 NOTE — Telephone Encounter (Signed)
Glad to hear that he is doing better. I would like to order a barium swallow/upper GI series to evaluate his anatomy post stenting. Diagnosis is dysphagia and gastrojejunal anastomosis stricture Thanks. GM

## 2022-04-03 NOTE — Telephone Encounter (Signed)
[name], Okay for GI pathogen panel to be drawn. Please have patient come in for the previously ordered chest x-ray and KUB. If he can get those stool studies done at [facility] I am perfectly fine with that. Thanks. GM

## 2022-04-04 ENCOUNTER — Encounter: Payer: Self-pay | Admitting: Gastroenterology

## 2022-04-04 ENCOUNTER — Ambulatory Visit (HOSPITAL_COMMUNITY)
Admission: RE | Admit: 2022-04-04 | Discharge: 2022-04-04 | Disposition: A | Discharge status: Home / Self Care | Payer: No Typology Code available for payment source | Source: Ambulatory Visit | Attending: Gastroenterology | Admitting: Gastroenterology

## 2022-04-04 DIAGNOSIS — R079 Chest pain, unspecified: Secondary | ICD-10-CM | POA: Insufficient documentation

## 2022-04-04 DIAGNOSIS — R1319 Other dysphagia: Secondary | ICD-10-CM | POA: Insufficient documentation

## 2022-04-04 DIAGNOSIS — K311 Adult hypertrophic pyloric stenosis: Secondary | ICD-10-CM | POA: Insufficient documentation

## 2022-04-04 DIAGNOSIS — R109 Unspecified abdominal pain: Secondary | ICD-10-CM | POA: Diagnosis present

## 2022-04-04 NOTE — Addendum Note (Signed)
Addended by: Loretha Stapler on: 04/04/2022 08:51 AM   Modules accepted: Orders

## 2022-04-06 ENCOUNTER — Other Ambulatory Visit (HOSPITAL_COMMUNITY)
Admission: RE | Admit: 2022-04-06 | Discharge: 2022-04-06 | Disposition: A | Discharge status: Home / Self Care | Payer: No Typology Code available for payment source | Source: Other Acute Inpatient Hospital | Attending: Gastroenterology | Admitting: Gastroenterology

## 2022-04-06 DIAGNOSIS — R197 Diarrhea, unspecified: Secondary | ICD-10-CM | POA: Insufficient documentation

## 2022-04-07 ENCOUNTER — Inpatient Hospital Stay: Payer: No Typology Code available for payment source | Attending: Physician Assistant

## 2022-04-07 DIAGNOSIS — Z87891 Personal history of nicotine dependence: Secondary | ICD-10-CM | POA: Insufficient documentation

## 2022-04-07 DIAGNOSIS — Z9884 Bariatric surgery status: Secondary | ICD-10-CM | POA: Diagnosis not present

## 2022-04-07 DIAGNOSIS — K219 Gastro-esophageal reflux disease without esophagitis: Secondary | ICD-10-CM | POA: Diagnosis not present

## 2022-04-07 DIAGNOSIS — D508 Other iron deficiency anemias: Secondary | ICD-10-CM

## 2022-04-07 DIAGNOSIS — I2510 Atherosclerotic heart disease of native coronary artery without angina pectoris: Secondary | ICD-10-CM | POA: Insufficient documentation

## 2022-04-07 DIAGNOSIS — G4733 Obstructive sleep apnea (adult) (pediatric): Secondary | ICD-10-CM | POA: Insufficient documentation

## 2022-04-07 DIAGNOSIS — D509 Iron deficiency anemia, unspecified: Secondary | ICD-10-CM | POA: Insufficient documentation

## 2022-04-07 DIAGNOSIS — I10 Essential (primary) hypertension: Secondary | ICD-10-CM | POA: Diagnosis not present

## 2022-04-07 DIAGNOSIS — Z800 Family history of malignant neoplasm of digestive organs: Secondary | ICD-10-CM | POA: Insufficient documentation

## 2022-04-07 DIAGNOSIS — E119 Type 2 diabetes mellitus without complications: Secondary | ICD-10-CM | POA: Insufficient documentation

## 2022-04-07 DIAGNOSIS — E785 Hyperlipidemia, unspecified: Secondary | ICD-10-CM | POA: Insufficient documentation

## 2022-04-07 LAB — CBC WITH DIFFERENTIAL/PLATELET
Abs Immature Granulocytes: 0.04 10*3/uL (ref 0.00–0.07)
Basophils Absolute: 0 10*3/uL (ref 0.0–0.1)
Basophils Relative: 1 %
Eosinophils Absolute: 0.3 10*3/uL (ref 0.0–0.5)
Eosinophils Relative: 4 %
HCT: 42.8 % (ref 39.0–52.0)
Hemoglobin: 14.5 g/dL (ref 13.0–17.0)
Immature Granulocytes: 1 %
Lymphocytes Relative: 19 %
Lymphs Abs: 1.4 10*3/uL (ref 0.7–4.0)
MCH: 30.6 pg (ref 26.0–34.0)
MCHC: 33.9 g/dL (ref 30.0–36.0)
MCV: 90.3 fL (ref 80.0–100.0)
Monocytes Absolute: 0.5 10*3/uL (ref 0.1–1.0)
Monocytes Relative: 7 %
Neutro Abs: 5 10*3/uL (ref 1.7–7.7)
Neutrophils Relative %: 68 %
Platelets: 152 10*3/uL (ref 150–400)
RBC: 4.74 MIL/uL (ref 4.22–5.81)
RDW: 12.8 % (ref 11.5–15.5)
WBC: 7.3 10*3/uL (ref 4.0–10.5)
nRBC: 0 % (ref 0.0–0.2)

## 2022-04-07 LAB — MISC LABCORP TEST (SEND OUT): Labcorp test code: 183480

## 2022-04-07 LAB — IRON AND TIBC
Iron: 64 ug/dL (ref 45–182)
Saturation Ratios: 21 % (ref 17.9–39.5)
TIBC: 303 ug/dL (ref 250–450)
UIBC: 239 ug/dL

## 2022-04-07 LAB — FERRITIN: Ferritin: 92 ng/mL (ref 24–336)

## 2022-04-08 ENCOUNTER — Encounter: Payer: Self-pay | Admitting: Gastroenterology

## 2022-04-10 ENCOUNTER — Other Ambulatory Visit (HOSPITAL_COMMUNITY): Payer: Self-pay

## 2022-04-10 ENCOUNTER — Ambulatory Visit (HOSPITAL_COMMUNITY)
Admission: RE | Admit: 2022-04-10 | Discharge: 2022-04-10 | Disposition: A | Discharge status: Home / Self Care | Payer: No Typology Code available for payment source | Source: Ambulatory Visit | Attending: Gastroenterology | Admitting: Gastroenterology

## 2022-04-10 ENCOUNTER — Other Ambulatory Visit: Payer: Self-pay | Admitting: Gastroenterology

## 2022-04-10 DIAGNOSIS — R1319 Other dysphagia: Secondary | ICD-10-CM

## 2022-04-10 DIAGNOSIS — R109 Unspecified abdominal pain: Secondary | ICD-10-CM | POA: Diagnosis present

## 2022-04-10 DIAGNOSIS — R079 Chest pain, unspecified: Secondary | ICD-10-CM

## 2022-04-10 DIAGNOSIS — K9189 Other postprocedural complications and disorders of digestive system: Secondary | ICD-10-CM

## 2022-04-10 NOTE — Progress Notes (Signed)
[facility] [address], [place] [address]   CLINIC:  Medical Oncology/Hematology  PCP:  Marguarite Arbour, MD [address] [facility] [place] [address] [phone]   REASON FOR VISIT:  Follow-up for iron deficiency anemia  PRIOR THERAPY: Oral iron tablets  CURRENT THERAPY: Intermittent IV iron  INTERVAL HISTORY:  [name] 61 y.o. male returns for routine follow-up of his iron deficiency anemia.  He was seen for initial consultation by Dr. Ellin Saba and Rojelio Brenner PA-C on 02/05/2022.  He received IV Venofer 300 mg x 3 from 02/05/2022 through 02/26/2022.  After IV iron, he felt some improved energy.  However, after his most recent GI stenting on 03/27/2022, he has had severe watery diarrhea and reports that anything he eats "goes straight through him."  This is caused a resurgence of his fatigue.  He denies any bright red blood per rectum or melena.  No pica, restless legs, headaches, chest pain, dyspnea on exertion, lightheadedness, or syncope.  He has chronic palpitations, on propranolol.  He has 30% energy and 80% appetite. He endorses that he is maintaining a stable weight.     REVIEW OF SYSTEMS:    Review of Systems  Constitutional:  Positive for fatigue. Negative for appetite change, chills, diaphoresis, fever and unexpected weight change.  HENT:   Negative for lump/mass and nosebleeds.   Eyes:  Negative for eye problems.  Respiratory:  Negative for cough, hemoptysis and shortness of breath.   Cardiovascular:  Negative for chest pain, leg swelling and palpitations.  Gastrointestinal:  Negative for abdominal pain, blood in stool, constipation, diarrhea, nausea and vomiting.  Genitourinary:  Negative for hematuria.   Skin: Negative.   Neurological:  Negative for dizziness, headaches and light-headedness.  Hematological:  Does not bruise/bleed easily.      PAST MEDICAL/SURGICAL HISTORY:  Past Medical History:   Diagnosis Date   Allergic rhinitis    Atrial tachycardia (HCC)    CAD (coronary artery disease)    a. 05/08/2015 Cath: no significant CAD, LVEF nl-->Med; b. 07/2017 MV: attenuation artifact, no ischemia, EF 65%-->Low risk; c. 03/2021 NSTEMI/PCI: LM nl, LAD 80p (3.0x18 Onyx Frontier DES), D1 90 (too small for PCI), LCX nl, RCA nl.   Diabetes mellitus without complication (HCC)    Diastolic dysfunction    a. 04/2015 Echo: EF 60-65%; b. 05/2016 Echo: EF 60-65%, GrI DD; c. 07/2017 Echo: EF 55-60%, Ao root 44mm; d. 03/2019 Echo: EF 55-60%, Ao root/Asc Ao 16mm; e. 03/2021 Echo: EF 50-55%, apical HK, mod asymm basal-septal hypertrophy. Nl RV fxn. Asc Ao 37mm.   Dilated aortic root (HCC)    a. 07/2017 Echo: 59mm Ao root - mildly dil; b. 03/2019 Echo: Ao root 46mm; c. 03/2021 Echo: Asc Ao 18mm.   Diverticulosis 10/03/2014   Dysrhythmia    Esophageal stricture    a.  In setting of lap band June 2022-stricture at the GJ anastomosis requiring gastrostomy tube; b. 05/2021 s/p esoph stenting (48mm); c. 07/2021 s/p esoph stenting (6mm).   Family history of premature CAD    a. father passed from MI at 58   GERD (gastroesophageal reflux disease)    GIB (gastrointestinal bleeding)    a. 07/2021 following esoph stenting.   Hemorrhoids    a. internal hemorrhoids s/p surgery 1999   History of kidney stones    History of tobacco abuse    Hyperlipidemia    Hyperplastic colon polyp 10/03/2014   a. x 2  Hypertension    Inflammatory arthritis    a. CCP antibodies & x-rays negative. Rheumatoid factor 14, felt to be crystaline over RA or psoriatic   Iron deficiency anemia 02/05/2022   Morbid obesity ([place])    a. s/p LAP-BAND - complicated by esoph stricture.   OSA (obstructive sleep apnea)    a. on CPAP   Osteoarthritis    PSVT (paroxysmal supraventricular tachycardia) (HCC)    a. 48 hr Holter 04/2015: NSR w/ rare PVC, short runs of narrow complex tachycardiac, possible atrial tach, longest run 7 beats, PACs  noted (2% of all beats 3600 total) they did not seem to correlate w/ significant arrythmia; b. 08/2017 Event monitor: no significant arrhythmias.   Past Surgical History:  Procedure Laterality Date   BALLOON DILATION N/A 08/14/2021   Procedure: BALLOON DILATION;  Surgeon: Mansouraty, Telford Nab., MD;  Location: [facility];  Service: Gastroenterology;  Laterality: N/A;   BALLOON DILATION N/A 02/20/2022   Procedure: BALLOON DILATION;  Surgeon: Milus Banister, MD;  Location: [facility];  Service: Gastroenterology;  Laterality: N/A;   BALLOON DILATION N/A 03/27/2022   Procedure: BALLOON DILATION;  Surgeon: Rush Landmark Telford Nab., MD;  Location: [place];  Service: Gastroenterology;  Laterality: N/A;   BARIATRIC SURGERY  01/2021   lap band    BILIARY STENT PLACEMENT N/A 08/14/2021   Procedure: AXIOS STENT PLACEMENT;  Surgeon: Irving Copas., MD;  Location: [facility];  Service: Gastroenterology;  Laterality: N/A;   BIOPSY  07/08/2021   Procedure: BIOPSY;  Surgeon: Rush Landmark Telford Nab., MD;  Location: [facility];  Service: Gastroenterology;;   BIOPSY  08/14/2021   Procedure: BIOPSY;  Surgeon: Irving Copas., MD;  Location: [facility];  Service: Gastroenterology;;   CARDIAC CATHETERIZATION N/A 05/08/2015   Procedure: Left Heart Cath and Coronary Angiography;  Surgeon: Jettie Booze, MD;  Location: [place] [facility];  Service: Cardiovascular;  Laterality: N/A;   CARPAL TUNNEL RELEASE Left    CHOLECYSTECTOMY     COLONOSCOPY  06/27/2005   COLONOSCOPY  10/03/2014   CORONARY STENT INTERVENTION N/A 04/15/2021   Procedure: CORONARY STENT INTERVENTION;  Surgeon: Lorretta Harp, MD;  Location: [place] [facility];  Service: Cardiovascular;  Laterality: N/A;   DUODENAL STENT PLACEMENT N/A 11/18/2021   Procedure: DUODENAL STENT PLACEMENT;  Surgeon: Rush Landmark Telford Nab., MD;  Location: [facility];  Service: Gastroenterology;  Laterality: N/A;  axios placed at   anastamosis   DUODENAL STENT PLACEMENT  03/27/2022   Procedure: GASTRIC STENT PLACEMENT;  Surgeon: Rush Landmark Telford Nab., MD;  Location: [place];  Service: Gastroenterology;;   ESOPHAGEAL DILATION  05/27/2021   Procedure: ESOPHAGEAL DILATION;  Surgeon: Irving Copas., MD;  Location: [facility];  Service: Gastroenterology;;   ESOPHAGEAL DILATION  01/23/2022   Procedure: ESOPHAGEAL DILATION;  Surgeon: Irving Copas., MD;  Location: [place];  Service: Gastroenterology;;   ESOPHAGEAL STENT PLACEMENT N/A 05/27/2021   Procedure: ESOPHAGEAL STENT PLACEMENT;  Surgeon: Irving Copas., MD;  Location: [facility];  Service: Gastroenterology;  Laterality: N/A;   ESOPHAGOGASTRODUODENOSCOPY  06/27/2005   ESOPHAGOGASTRODUODENOSCOPY N/A 03/19/2021   Procedure: ESOPHAGOGASTRODUODENOSCOPY (EGD);  Surgeon: Kieth Brightly, Arta Bruce, MD;  Location: [facility];  Service: General;  Laterality: N/A;   ESOPHAGOGASTRODUODENOSCOPY N/A 08/16/2021   Procedure: ESOPHAGOGASTRODUODENOSCOPY (EGD);  Surgeon: Irving Copas., MD;  Location: [place];  Service: Gastroenterology;  Laterality: N/A;   ESOPHAGOGASTRODUODENOSCOPY (EGD) WITH PROPOFOL N/A 05/27/2021   Procedure: ESOPHAGOGASTRODUODENOSCOPY (EGD) WITH PROPOFOL;  Surgeon: Rush Landmark Telford Nab., MD;  Location:  [facility];  Service: Gastroenterology;  Laterality: N/A;   ESOPHAGOGASTRODUODENOSCOPY (EGD) WITH PROPOFOL N/A 07/08/2021   Procedure: ESOPHAGOGASTRODUODENOSCOPY (EGD) WITH PROPOFOL;  Surgeon: Meridee Score Netty Starring., MD;  Location: [facility];  Service: Gastroenterology;  Laterality: N/A;  fluoro   ESOPHAGOGASTRODUODENOSCOPY (EGD) WITH PROPOFOL N/A 08/14/2021   Procedure: ESOPHAGOGASTRODUODENOSCOPY (EGD) WITH PROPOFOL;  Surgeon: Meridee Score Netty Starring., MD;  Location: [facility];  Service: Gastroenterology;  Laterality: N/A;  fluoro Axios stent (20 mm)   ESOPHAGOGASTRODUODENOSCOPY (EGD) WITH PROPOFOL N/A  10/16/2021   Procedure: ESOPHAGOGASTRODUODENOSCOPY (EGD) WITH PROPOFOL;  Surgeon: Meridee Score Netty Starring., MD;  Location: [facility];  Service: Gastroenterology;  Laterality: N/A;  axios stent pull fluoro   ESOPHAGOGASTRODUODENOSCOPY (EGD) WITH PROPOFOL N/A 11/18/2021   Procedure: ESOPHAGOGASTRODUODENOSCOPY (EGD) WITH PROPOFOL;  Surgeon: Meridee Score Netty Starring., MD;  Location: [facility];  Service: Gastroenterology;  Laterality: N/A;   ESOPHAGOGASTRODUODENOSCOPY (EGD) WITH PROPOFOL N/A 01/23/2022   Procedure: ESOPHAGOGASTRODUODENOSCOPY (EGD) WITH PROPOFOL;  Surgeon: Meridee Score Netty Starring., MD;  Location: [facility];  Service: Gastroenterology;  Laterality: N/A;   ESOPHAGOGASTRODUODENOSCOPY (EGD) WITH PROPOFOL N/A 02/20/2022   Procedure: ESOPHAGOGASTRODUODENOSCOPY (EGD) WITH PROPOFOL;  Surgeon: Rachael Fee, MD;  [facility];  Service: Gastroenterology;  Laterality: N/A;   ESOPHAGOGASTRODUODENOSCOPY (EGD) WITH PROPOFOL N/A 03/27/2022   Procedure: ESOPHAGOGASTRODUODENOSCOPY (EGD) WITH PROPOFOL;  Surgeon: Meridee Score Netty Starring., MD;  Location: [facility];  Service: Gastroenterology;  Laterality: N/A;   FLEXIBLE SIGMOIDOSCOPY N/A 08/16/2021   Procedure: FLEXIBLE SIGMOIDOSCOPY;  Surgeon: Meridee Score Netty Starring., MD;  Location: [facility];  Service: Gastroenterology;  Laterality: N/A;   FOREIGN BODY REMOVAL  02/20/2022   Procedure: FOREIGN BODY REMOVAL;  Surgeon: Rachael Fee, MD;  Location: [facility];  Service: Gastroenterology;;   FOREIGN BODY REMOVAL ESOPHAGEAL  03/27/2022   Procedure: REMOVAL FOREIGN BODY;  Surgeon: Meridee Score Netty Starring., MD;  Location: [facility] END[facility]COPY;  Service: Gastroenterology;;   GASTRIC ROUX-EN-Y N/A 02/18/2021   Procedure: LAPAROSCOPIC ROUX-EN-Y GASTRIC BYPASS WITH UPPER ENDOSCOPY,;  Surgeon: Sheliah Hatch De Blanch, MD;  Location: [facility];  Service: General;  Laterality: N/A;   GASTROINTESTINAL STENT REMOVAL  01/23/2022   Procedure: Daine Gip STENT REMOVAL;   Surgeon: Lemar Lofty., MD;  Location: [facility] END[facility];  Service: Gastroenterology;;   HEMORRHOID SURGERY  08/25/1997   IR GJ TUBE CHANGE  03/27/2021   LAPAROSCOPIC INSERTION GASTROSTOMY TUBE N/A 03/20/2021   Procedure: LAPAROSCOPIC INSERTION GASTROSTOMY TUBE;  Surgeon: Rodman Pickle, MD;  Location: [facility];  Service: General;  Laterality: N/A;   LEFT HEART CATH AND CORONARY ANGIOGRAPHY N/A 04/15/2021   Procedure: LEFT HEART CATH AND CORONARY ANGIOGRAPHY;  Surgeon: Runell Gess, MD;  Location: [facility];  Service: Cardiovascular;  Laterality: N/A;   MOUTH SURGERY     removed area which was benign   STENT REMOVAL  07/08/2021   Procedure: STENT REMOVAL;  Surgeon: Lemar Lofty., MD;  Location: [facility];  Service: Gastroenterology;;   Francine Graven REMOVAL  10/16/2021   Procedure: STENT REMOVAL;  Surgeon: Lemar Lofty., MD;  Location: [facility];  Service: Gastroenterology;;   Karna Dupes INJECTION  01/23/2022   Procedure: STEROID INJECTION;  Surgeon: Lemar Lofty., MD;  Location: [facility];  Service: Gastroenterology;;   SUBMUCOSAL INJECTION  11/18/2021   Procedure: SUBMUCOSAL INJECTION;  Surgeon: Lemar Lofty., MD;  Location: [facility];  Service: Gastroenterology;;   TONSILLECTOMY     UPPER GI ENDOSCOPY N/A 03/20/2021   Procedure: UPPER GI ENDOSCOPY;  Surgeon: Sheliah Hatch De Blanch, MD;  Location: [facility];  Service: General;  Laterality: N/A;     SOCIAL HISTORY:  Social History   Socioeconomic History   Marital status: Married    Spouse name: Not on file   Number of children: Not on file   Years of education: Not on file   Highest education level: Not on file  Occupational History   Not on file  Tobacco Use   Smoking status: Former    Packs/day: 1.00    Years: 15.00    Total pack years: 15.00    Types: Cigarettes    Quit date: 08/05/1989    Years since quitting: 32.7   Smokeless tobacco: Never  Vaping Use    Vaping Use: Never used  Substance and Sexual Activity   Alcohol use: Yes    Comment: occ   Drug use: Never   Sexual activity: Not on file  Other Topics Concern   Not on file  Social History Narrative   Not on file   Social Determinants of Health   Financial Resource Strain: Not on file  Food Insecurity: Not on file  Transportation Needs: Not on file  Physical Activity: Not on file  Stress: Not on file  Social Connections: Not on file  Intimate Partner Violence: Not on file    FAMILY HISTORY:  Family History  Problem Relation Age of Onset   Hypertension Mother    Diabetes Mother    Heart attack Father 69   CAD Father    Hypertension Sister    Diabetes Other    Hypertension Other    Heart disease Other    Colon cancer Neg Hx    Esophageal cancer Neg Hx    Pancreatic cancer Neg Hx    Stomach cancer Neg Hx    Liver disease Neg Hx    Inflammatory bowel disease Neg Hx    Rectal cancer Neg Hx     CURRENT MEDICATIONS: Listed elsewhere in EMR.  Reviewed by me as of 04/11/2022.   ALLERGIES:  Allergies  Allergen Reactions   Other Other (See Comments)    Cats     PHYSICAL EXAM:    ECOG PERFORMANCE STATUS: 1 - Symptomatic but completely ambulatory  There were no vitals filed for this visit. There were no vitals filed for this visit. Physical Exam Constitutional:      Appearance: Normal appearance. He is obese.  HENT:     Head: Normocephalic and atraumatic.     Mouth/Throat:     Mouth: Mucous membranes are moist.  Eyes:     Extraocular Movements: Extraocular movements intact.     Pupils: Pupils are equal, round, and reactive to light.  Cardiovascular:     Rate and Rhythm: Normal rate and regular rhythm.     Pulses: Normal pulses.     Heart sounds: Normal heart sounds.  Pulmonary:     Effort: Pulmonary effort is normal.     Breath sounds: Normal breath sounds.  Abdominal:     General: Bowel sounds are normal.     Palpations: Abdomen is soft.      Tenderness: There is no abdominal tenderness.  Musculoskeletal:        General: No swelling.     Right lower leg: No edema.     Left lower leg: No edema.  Lymphadenopathy:     Cervical: No cervical adenopathy.  Skin:    General: Skin is warm and dry.  Neurological:     General: No focal deficit present.     Mental Status: He is  alert and oriented to person, place, and time.  Psychiatric:        Mood and Affect: Mood normal.        Behavior: Behavior normal.      LABORATORY DATA:  I have reviewed the labs as listed.  CBC    Component Value Date/Time   WBC 7.3 04/07/2022 1351   RBC 4.74 04/07/2022 1351   HGB 14.5 04/07/2022 1351   HCT 42.8 04/07/2022 1351   PLT 152 04/07/2022 1351   MCV 90.3 04/07/2022 1351   MCH 30.6 04/07/2022 1351   MCHC 33.9 04/07/2022 1351   RDW 12.8 04/07/2022 1351   LYMPHSABS 1.4 04/07/2022 1351   MONOABS 0.5 04/07/2022 1351   EOSABS 0.3 04/07/2022 1351   BASOSABS 0.0 04/07/2022 1351      Latest Ref Rng & Units 03/13/2022    8:03 AM 08/16/2021    2:36 AM 08/15/2021    9:15 AM  CMP  Glucose 70 - 99 mg/dL 143  178  254   BUN 6 - 20 mg/dL 6  20  25    Creatinine 0.61 - 1.24 mg/dL 0.53  0.78  0.74   Sodium 135 - 145 mmol/L 139  137  137   Potassium 3.5 - 5.1 mmol/L 4.5  3.9  4.7   Chloride 98 - 111 mmol/L 102  104  101   CO2 22 - 32 mmol/L 30  29  25    Calcium 8.9 - 10.3 mg/dL 9.0  8.4  8.4   Total Protein 6.5 - 8.1 g/dL 7.0  5.2  5.7   Total Bilirubin 0.3 - 1.2 mg/dL 0.9  0.5  0.6   Alkaline Phos 38 - 126 U/L 99  64  73   AST 15 - 41 U/L 49  17  23   ALT 0 - 44 U/L 88  21  26     DIAGNOSTIC IMAGING:  I have independently reviewed the relevant imaging and discussed with the patient.  ASSESSMENT & PLAN: 1.  Iron deficiency anemia - Seen  at the request of his primary care provider (Dr. Fulton Reek) for evaluation and treatment of iron deficiency anemia with IV iron infusions requested. - Gastric Roux-en-Y surgery in June [id number],  complicated by gastrojejunal anastomotic stricture requiring multiple stents - Hospitalized from 08/15/2021 through 08/17/2021 due to upper GI bleed thought to be related to placement of GJ stent.  During hospitalization, Hgb dropped to 7.8, transfused 1 unit PRBC and IV iron (Ferrlecit 250 mg) - Most recent EGD (03/27/2022) was performed for staple removal and treatment of gastric stenosis with prosthesis.  No bleeding was noted.  He continues to follow closely with Dr. Rush Landmark, his gastroenterologist. - Normal Hgb prior to December 2022, lowest Hgb 7.8 (08/16/2021) - Hematology work-up (02/05/2022): Normal B12, folate, copper, methylmalonic acid, homocystine. Normal LDH, reticulocytes, SPEP. - He has been taking ferrous sulfate 325 mg twice daily since last year.  Currently taking Plavix, Protonix, and Carafate. - Received IV Venofer 300 mg x 3 from 02/05/2022 through 02/26/2022 - Most recent labs (04/07/2022): Hgb 14.5, ferritin 92, iron saturation 21% - Symptoms after iron initially improved, but recurrent fatigue secondary to ongoing complications from GI surgery - Patient denies any recent hematemesis, hematochezia, or melena.   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 - PLAN: No IV iron needed at this time - Repeat CBC/iron panel  and RTC in 3 months  - Patient can continue to take oral iron supplement ONCE daily dosing, at least 2 hours separate from his PPI and Carafate    2.  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  He lives at home with his wife.  He has a remote history of tobacco use, smoked  1 PPD cigarettes for 9 years, but quit in 1991.  He denies any alcohol or illicit drug use. -- FAMILY:  Maternal grandfather with pancreatic cancer.  Patient's sister also has iron deficiency anemia secondary to history of gastric bypass surgery.   PLAN SUMMARY & DISPOSITION: Labs and office visit in 3 months  All questions were answered. The patient knows to call the clinic with any problems, questions or concerns.  Medical decision making: Low  Time spent on visit: I spent 15 minutes counseling the patient face to face. The total time spent in the appointment was 22 minutes and more than 50% was on counseling.   Harriett Rush, PA-C  04/11/2022 8:46 AM

## 2022-04-11 ENCOUNTER — Inpatient Hospital Stay (HOSPITAL_BASED_OUTPATIENT_CLINIC_OR_DEPARTMENT_OTHER): Payer: No Typology Code available for payment source | Admitting: Physician Assistant

## 2022-04-11 ENCOUNTER — Other Ambulatory Visit (HOSPITAL_COMMUNITY): Payer: Self-pay

## 2022-04-11 VITALS — BP 126/84 | HR 87 | Temp 98.0°F | Resp 18 | Ht 74.0 in | Wt 222.2 lb

## 2022-04-11 DIAGNOSIS — D509 Iron deficiency anemia, unspecified: Secondary | ICD-10-CM | POA: Diagnosis not present

## 2022-04-11 DIAGNOSIS — D508 Other iron deficiency anemias: Secondary | ICD-10-CM

## 2022-04-11 MED ORDER — AZITHROMYCIN 250 MG PO TABS
ORAL_TABLET | ORAL | 0 refills | Status: DC
  Filled 2022-04-11: qty 6, 5d supply, fill #0

## 2022-04-11 MED ORDER — DIPHENOXYLATE-ATROPINE 2.5-0.025 MG PO TABS
ORAL_TABLET | ORAL | 0 refills | Status: DC
Start: 2022-04-11 — End: 2022-07-01
  Filled 2022-04-11: qty 15, 3d supply, fill #0

## 2022-04-14 ENCOUNTER — Inpatient Hospital Stay: Payer: No Typology Code available for payment source | Admitting: Physician Assistant

## 2022-04-22 ENCOUNTER — Encounter: Payer: Self-pay | Admitting: Gastroenterology

## 2022-04-23 ENCOUNTER — Other Ambulatory Visit (HOSPITAL_COMMUNITY): Payer: Self-pay

## 2022-04-23 ENCOUNTER — Other Ambulatory Visit: Payer: Self-pay | Admitting: Cardiovascular Disease

## 2022-04-25 ENCOUNTER — Other Ambulatory Visit (HOSPITAL_COMMUNITY): Payer: Self-pay

## 2022-04-25 ENCOUNTER — Telehealth: Payer: Self-pay | Admitting: Cardiovascular Disease

## 2022-04-25 ENCOUNTER — Other Ambulatory Visit: Payer: Self-pay | Admitting: Cardiovascular Disease

## 2022-04-25 MED ORDER — PROPRANOLOL HCL 10 MG PO TABS
ORAL_TABLET | ORAL | 3 refills | Status: DC
  Filled 2022-04-25: qty 60, 10d supply, fill #0
  Filled 2022-05-16: qty 60, 10d supply, fill #1
  Filled 2022-05-28: qty 60, 10d supply, fill #2
  Filled 2022-06-10: qty 60, 10d supply, fill #3

## 2022-04-25 NOTE — Telephone Encounter (Signed)
*  STAT* If patient is at the pharmacy, call can be transferred to refill team.   1. Which medications need to be refilled? (please list name of each medication and dose if known)   propranolol (INDERAL) 10 MG tablet  2. Which pharmacy/location (including street and city if local pharmacy) is medication to be sent to?  [facility]  3. Do they need a 30 day or 90 day supply?   Patient called stating he has been unable to get refills for this medication and he is completely out.

## 2022-04-25 NOTE — Telephone Encounter (Signed)
Pt's medication was sent to pt's pharmacy as requested. Confirmation received.  °

## 2022-05-12 ENCOUNTER — Other Ambulatory Visit: Payer: Self-pay | Admitting: Gastroenterology

## 2022-05-12 ENCOUNTER — Other Ambulatory Visit (HOSPITAL_COMMUNITY): Payer: Self-pay

## 2022-05-12 MED ORDER — ONDANSETRON 4 MG PO TBDP
4.0000 mg | ORAL_TABLET | Freq: Four times a day (QID) | ORAL | 0 refills | Status: DC | PRN
  Filled 2022-05-12: qty 20, 5d supply, fill #0

## 2022-05-14 ENCOUNTER — Other Ambulatory Visit (HOSPITAL_COMMUNITY): Payer: Self-pay

## 2022-05-16 ENCOUNTER — Other Ambulatory Visit (HOSPITAL_COMMUNITY): Payer: Self-pay

## 2022-05-23 ENCOUNTER — Encounter: Payer: Self-pay | Admitting: Gastroenterology

## 2022-05-26 ENCOUNTER — Other Ambulatory Visit (HOSPITAL_COMMUNITY): Payer: Self-pay

## 2022-05-26 ENCOUNTER — Other Ambulatory Visit: Payer: Self-pay | Admitting: Gastroenterology

## 2022-05-28 ENCOUNTER — Other Ambulatory Visit (HOSPITAL_COMMUNITY): Payer: Self-pay

## 2022-05-28 ENCOUNTER — Telehealth: Payer: Self-pay | Admitting: Cardiovascular Disease

## 2022-05-28 MED ORDER — CLOPIDOGREL BISULFATE 75 MG PO TABS: 75.0000 mg | ORAL_TABLET | Freq: Every day | ORAL | 2 refills | Status: DC

## 2022-05-28 NOTE — Telephone Encounter (Signed)
*  STAT* If patient is at the pharmacy, call can be transferred to refill team.   1. Which medications need to be refilled? (please list name of each medication and dose if known) clopidogrel (PLAVIX) 75 MG tablet  2. Which pharmacy/location (including street and city if local pharmacy) is medication to be sent to? [place]  3. Do they need a 30 day or 90 day supply? 90  Pt was told that this needed to be switch to his cardiologist to fill. Requesting refill.

## 2022-05-28 NOTE — Telephone Encounter (Signed)
Refill to pharmacy 

## 2022-06-03 ENCOUNTER — Other Ambulatory Visit: Payer: Self-pay | Admitting: Cardiovascular Disease

## 2022-06-03 ENCOUNTER — Other Ambulatory Visit (HOSPITAL_COMMUNITY): Payer: Self-pay

## 2022-06-04 ENCOUNTER — Other Ambulatory Visit (HOSPITAL_COMMUNITY): Payer: Self-pay

## 2022-06-04 MED ORDER — CLOPIDOGREL BISULFATE 75 MG PO TABS
75.0000 mg | ORAL_TABLET | Freq: Every day | ORAL | 0 refills | Status: DC
  Filled 2022-06-04: qty 90, 90d supply, fill #0

## 2022-06-05 ENCOUNTER — Encounter (HOSPITAL_COMMUNITY): Payer: Self-pay | Admitting: Hematology

## 2022-06-05 ENCOUNTER — Other Ambulatory Visit (HOSPITAL_COMMUNITY): Payer: Self-pay

## 2022-06-05 MED ORDER — XIFAXAN 200 MG PO TABS
200.0000 mg | ORAL_TABLET | Freq: Three times a day (TID) | ORAL | 0 refills | Status: AC
  Filled 2022-06-05: qty 9, 3d supply, fill #0

## 2022-06-10 ENCOUNTER — Other Ambulatory Visit (HOSPITAL_COMMUNITY): Payer: Self-pay

## 2022-06-10 ENCOUNTER — Other Ambulatory Visit: Payer: Self-pay | Admitting: Cardiovascular Disease

## 2022-06-10 MED ORDER — PROPRANOLOL HCL 10 MG PO TABS
ORAL_TABLET | ORAL | 3 refills | Status: DC
  Filled 2022-06-10: qty 90, 15d supply, fill #0
  Filled 2022-07-28: qty 90, 15d supply, fill #1
  Filled 2022-08-13: qty 90, 15d supply, fill #2
  Filled 2022-08-27: qty 90, 15d supply, fill #3

## 2022-06-13 ENCOUNTER — Other Ambulatory Visit (HOSPITAL_COMMUNITY): Payer: Self-pay

## 2022-06-16 ENCOUNTER — Other Ambulatory Visit (HOSPITAL_COMMUNITY): Payer: Self-pay

## 2022-06-20 ENCOUNTER — Other Ambulatory Visit (HOSPITAL_COMMUNITY): Payer: Self-pay

## 2022-06-24 ENCOUNTER — Ambulatory Visit: Payer: No Typology Code available for payment source | Admitting: Gastroenterology

## 2022-07-01 ENCOUNTER — Ambulatory Visit (INDEPENDENT_AMBULATORY_CARE_PROVIDER_SITE_OTHER): Payer: No Typology Code available for payment source | Admitting: Gastroenterology

## 2022-07-01 ENCOUNTER — Encounter: Payer: Self-pay | Admitting: Gastroenterology

## 2022-07-01 VITALS — BP 100/70 | HR 76 | Ht 69.5 in | Wt 224.5 lb

## 2022-07-01 DIAGNOSIS — R432 Parageusia: Secondary | ICD-10-CM | POA: Diagnosis not present

## 2022-07-01 DIAGNOSIS — R1310 Dysphagia, unspecified: Secondary | ICD-10-CM | POA: Diagnosis not present

## 2022-07-01 DIAGNOSIS — Z8639 Personal history of other endocrine, nutritional and metabolic disease: Secondary | ICD-10-CM

## 2022-07-01 DIAGNOSIS — K9189 Other postprocedural complications and disorders of digestive system: Secondary | ICD-10-CM

## 2022-07-01 DIAGNOSIS — Z1211 Encounter for screening for malignant neoplasm of colon: Secondary | ICD-10-CM

## 2022-07-01 NOTE — Patient Instructions (Signed)
Contact office in 1 week, if you have not heard any updates on [facility] referral. Contact office at [phone] or you may send my chart message.   _______________________________________________________  If you are age 60 or older, your body mass index should be between 23-30. Your Body mass index is 32.68 kg/m. If this is out of the aforementioned range listed, please consider follow up with your Primary Care Provider.  If you are age 55 or younger, your body mass index should be between 19-25. Your Body mass index is 32.68 kg/m. If this is out of the aformentioned range listed, please consider follow up with your Primary Care Provider.   ________________________________________________________  The [facility] providers would like to encourage you to use [facility] to communicate with providers for non-urgent requests or questions.  Due to long hold times on the telephone, sending your provider a message by [facility] may be a faster and more efficient way to get a response.  Please allow 48 business hours for a response.  Please remember that this is for non-urgent requests.  _______________________________________________________  Thank you for choosing me and [facility].  Dr. Rush Landmark

## 2022-07-04 ENCOUNTER — Inpatient Hospital Stay: Payer: No Typology Code available for payment source | Attending: Physician Assistant

## 2022-07-04 ENCOUNTER — Encounter: Payer: Self-pay | Admitting: Gastroenterology

## 2022-07-04 DIAGNOSIS — D508 Other iron deficiency anemias: Secondary | ICD-10-CM

## 2022-07-04 DIAGNOSIS — I2510 Atherosclerotic heart disease of native coronary artery without angina pectoris: Secondary | ICD-10-CM | POA: Insufficient documentation

## 2022-07-04 DIAGNOSIS — I10 Essential (primary) hypertension: Secondary | ICD-10-CM | POA: Insufficient documentation

## 2022-07-04 DIAGNOSIS — Z79899 Other long term (current) drug therapy: Secondary | ICD-10-CM | POA: Insufficient documentation

## 2022-07-04 DIAGNOSIS — Z01810 Encounter for preprocedural cardiovascular examination: Secondary | ICD-10-CM | POA: Insufficient documentation

## 2022-07-04 DIAGNOSIS — E119 Type 2 diabetes mellitus without complications: Secondary | ICD-10-CM | POA: Diagnosis not present

## 2022-07-04 DIAGNOSIS — D509 Iron deficiency anemia, unspecified: Secondary | ICD-10-CM | POA: Diagnosis not present

## 2022-07-04 DIAGNOSIS — G4733 Obstructive sleep apnea (adult) (pediatric): Secondary | ICD-10-CM | POA: Insufficient documentation

## 2022-07-04 DIAGNOSIS — Z87891 Personal history of nicotine dependence: Secondary | ICD-10-CM | POA: Diagnosis not present

## 2022-07-04 DIAGNOSIS — Z800 Family history of malignant neoplasm of digestive organs: Secondary | ICD-10-CM | POA: Diagnosis not present

## 2022-07-04 DIAGNOSIS — R432 Parageusia: Secondary | ICD-10-CM | POA: Insufficient documentation

## 2022-07-04 DIAGNOSIS — R002 Palpitations: Secondary | ICD-10-CM | POA: Insufficient documentation

## 2022-07-04 DIAGNOSIS — E785 Hyperlipidemia, unspecified: Secondary | ICD-10-CM | POA: Diagnosis not present

## 2022-07-04 DIAGNOSIS — Z1211 Encounter for screening for malignant neoplasm of colon: Secondary | ICD-10-CM | POA: Insufficient documentation

## 2022-07-04 DIAGNOSIS — Z9884 Bariatric surgery status: Secondary | ICD-10-CM | POA: Insufficient documentation

## 2022-07-04 DIAGNOSIS — Z8639 Personal history of other endocrine, nutritional and metabolic disease: Secondary | ICD-10-CM | POA: Insufficient documentation

## 2022-07-04 LAB — CBC WITH DIFFERENTIAL/PLATELET
Abs Immature Granulocytes: 0.04 10*3/uL (ref 0.00–0.07)
Basophils Absolute: 0 10*3/uL (ref 0.0–0.1)
Basophils Relative: 0 %
Eosinophils Absolute: 0.3 10*3/uL (ref 0.0–0.5)
Eosinophils Relative: 3 %
HCT: 45.7 % (ref 39.0–52.0)
Hemoglobin: 15.3 g/dL (ref 13.0–17.0)
Immature Granulocytes: 0 %
Lymphocytes Relative: 17 %
Lymphs Abs: 1.5 10*3/uL (ref 0.7–4.0)
MCH: 30.1 pg (ref 26.0–34.0)
MCHC: 33.5 g/dL (ref 30.0–36.0)
MCV: 90 fL (ref 80.0–100.0)
Monocytes Absolute: 0.8 10*3/uL (ref 0.1–1.0)
Monocytes Relative: 9 %
Neutro Abs: 6.3 10*3/uL (ref 1.7–7.7)
Neutrophils Relative %: 71 %
Platelets: 173 10*3/uL (ref 150–400)
RBC: 5.08 MIL/uL (ref 4.22–5.81)
RDW: 13 % (ref 11.5–15.5)
WBC: 9 10*3/uL (ref 4.0–10.5)
nRBC: 0 % (ref 0.0–0.2)

## 2022-07-04 LAB — IRON AND TIBC
Iron: 76 ug/dL (ref 45–182)
Saturation Ratios: 23 % (ref 17.9–39.5)
TIBC: 325 ug/dL (ref 250–450)
UIBC: 249 ug/dL

## 2022-07-04 LAB — FERRITIN: Ferritin: 81 ng/mL (ref 24–336)

## 2022-07-04 NOTE — Progress Notes (Signed)
GASTROENTEROLOGY OUTPATIENT CLINIC VISIT   Primary Care Provider Marguarite ArbourSparks, Jeffrey D, MD [address] [facility] [place] [id number] [phone]  Referring Provider Dr. Sheliah HatchKinsinger   Patient Profile: Hector BolognaGary K Ager is a 61 y.o. 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  The [facility] for an evaluation and management of problem(s) noted below:  Problem List 1. Gastrojejunal anastomotic stricture   2. Dysphagia, unspecified type   3. Dysgeusia   4. Colon cancer screening   5. History of iron deficiency     History of Present Illness Please see prior progress notes for full details of HPI.  Interval History The patient returns for follow-up.  He has done relatively well with the AXIOS stent at his anastomosis stricture, as he is done previously.  It has been nearly 3 months since the placement of it.  He followed up with his bariatric surgeon and [facility] and then referred back to his primary bariatric surgeon.  They discussed potential revisional surgery to his Roux-en-Y but he wanted another opinion.  [facility] see if further treatments could be offered.  He was scheduled with  Dr[facility] Cleda [facility]semi [facility] rather than advanced end[facility].  Patient has had stable weight overall.  He is very hesitant about having complete revisional gastric bypass surgery.  He would like to know if any other endoscopic therapies could be considered.  His bowels are stable without any evidence of overt bleeding.  GI Review of Systems Positive as above Negative for dysphagia,  odynophagia, alteration of bowel habits, melena, hematochezia, pain   Review of Systems General: Denies fevers/chills/unintentional weight loss Cardiovascular: Denies chest pain Pulmonary: Denies shortness of breath Gastroenterological: See HPI Genitourinary: Denies darkened urine Hematological: Positive for easy bruising/bleeding due to antiplatelet therapy Dermatological: Denies jaundice Psychological: Mood is stable   Medications Current Outpatient Medications  Medication Sig Dispense Refill   ascorbic acid (VITAMIN C) 500 MG tablet Take 1,000 mg by mouth daily.     atorvastatin (LIPITOR) 10 MG tablet Take 1 tablet (10 mg total) by mouth daily. 90 tablet 2   b complex vitamins capsule Take by mouth daily.     Calcium Citrate-Vitamin D (BL CALCIUM CITRATE + D PO) Take 500 mg by mouth. With 500 IU of vitamin D     clopidogrel (PLAVIX) 75 MG tablet Take 1 tablet (75 mg total) by mouth daily. 90 tablet 0   Continuous Blood Gluc Sensor (FREESTYLE LIBRE 2 SENSOR) MISC Use as directed every 14 days 10 each 1   ferrous sulfate 325 (65 FE) MG tablet Take 1 tablet by mouth 2 times daily with a meal. 600 tablet 0   fluticasone (FLONASE) 50 MCG/ACT nasal spray Place 1 spray into both nostrils daily as needed for allergies or rhinitis.     metoprolol tartrate (LOPRESSOR) 50 MG tablet Take 1 tablet by mouth 2  times daily. 180 tablet 3   mometasone (ELOCON) 0.1 % lotion Apply to affected areas once daily 60 mL 5   Multiple Vitamin (MULTI-VITAMIN DAILY PO) Take by mouth. Bariartic     pantoprazole (PROTONIX) 40 MG tablet Take 1 tablet (40 mg total) by mouth 2 (two)  times daily before a meal. 60 tablet 12   Potassium Chloride ER 20 MEQ TBCR Take 1 tablet by mouth 2 times daily 60 tablet 5   PRESCRIPTION MEDICATION at bedtime. Bipap     Probiotic Product (PROBIOTIC ADVANCED PO) Take 1 capsule by mouth. And PREBIOTIC - bariatric     propranolol (INDERAL) 10 MG tablet Take 1 to 2 tablets by mouth 3  times daily as needed for tachycardia. 90 tablet 3   Propylene Glycol (SYSTANE BALANCE OP) Place 1 drop into both eyes daily as needed (dry eyes).     SODIUM FLUORIDE 5000 SENSITIVE 1.1-5 % GEL Place 1 application onto teeth daily.     nitroGLYCERIN (NITROSTAT) 0.4 MG SL tablet Place 1 tablet (0.4 mg total) under the tongue every 5 (five) minutes as needed for chest pain. (Patient not taking: Reported on 07/01/2022) 25 tablet 3   ondansetron (ZOFRAN-ODT) 4 MG disintegrating tablet DIssolve 1 tablet by mouth every 6 (six) hours as needed for nausea or vomiting. (Patient not taking: Reported on 07/01/2022) 20 tablet 0   No current facility-administered medications for this visit.    Allergies Allergies  Allergen Reactions   Other Other (See Comments)    Cats    Histories Past Medical History:  Diagnosis Date   Allergic rhinitis    Atrial tachycardia    CAD (coronary artery disease)    a. 05/08/2015 Cath: no significant CAD, LVEF nl-->Med; b. 07/2017 MV: attenuation artifact, no ischemia, EF 65%-->Low risk; c. 03/2021 NSTEMI/PCI: LM nl, LAD 80p (3.0x18 Onyx Frontier DES), D1 90 (too small for PCI), LCX nl, RCA nl.   Diabetes mellitus without complication (HCC)    Diastolic dysfunction    a. 04/2015 Echo: EF 60-65%; b. 05/2016 Echo: EF 60-65%, GrI DD; c. 07/2017 Echo: EF 55-60%, Ao root 3mm; d. 03/2019 Echo: EF 55-60%, Ao root/Asc Ao 28mm; e. 03/2021 Echo: EF 50-55%, apical HK, mod asymm basal-septal hypertrophy. Nl RV fxn. Asc Ao 67mm.   Dilated aortic root (HCC)    a. 07/2017 Echo: 71mm Ao root - mildly dil; b. 03/2019 Echo: Ao root 10mm; c. 03/2021 Echo: Asc Ao 71mm.   Diverticulosis 10/03/2014   Dysrhythmia    Esophageal stricture    a.  In setting of lap band June 2022-stricture at the GJ anastomosis requiring gastrostomy tube; b. 05/2021 s/p esoph stenting (69mm); c. 07/2021 s/p esoph stenting (72mm).   Family history of premature CAD    a. father passed from MI at 44   GERD  (gastroesophageal reflux disease)    GIB (gastrointestinal bleeding)    a. 07/2021 following esoph stenting.   Hemorrhoids    a. internal hemorrhoids s/p surgery 1999   History of kidney stones    History of tobacco abuse    Hyperlipidemia    Hyperplastic colon polyp 10/03/2014   a. x 2    Hypertension    Inflammatory arthritis    a. CCP antibodies & x-rays negative. Rheumatoid factor 14, felt to be crystaline over RA or psoriatic   Iron deficiency anemia 02/05/2022   Morbid obesity (HCC)    a. s/p LAP-BAND - complicated by esoph stricture.   OSA (obstructive sleep apnea)    a. on CPAP   Osteoarthritis    PSVT (paroxysmal supraventricular tachycardia)    a. 48 hr Holter 04/2015: NSR w/ rare PVC, short runs of narrow complex tachycardiac, possible atrial tach, longest run 7 beats, PACs noted (2% of all beats 3600 total) they did not  seem to correlate w/ significant arrythmia; b. 08/2017 Event monitor: no significant arrhythmias.   Past Surgical History:  Procedure Laterality Date   BALLOON DILATION N/A 08/14/2021   Procedure: BALLOON DILATION;  Surgeon: Mansouraty, Netty Starring., MD;  Location: [facility];  Service: Gastroenterology;  Laterality: N/A;   BALLOON DILATION N/A 02/20/2022   Procedure: BALLOON DILATION;  Surgeon: Rachael Fee, MD;  Location: [facility];  Service: Gastroenterology;  Laterality: N/A;   BALLOON DILATION N/A 03/27/2022   Procedure: BALLOON DILATION;  Surgeon: Meridee Score Netty Starring., MD;  Location: [facility] ENDOSCOPY;  Service: Gastroenterology;  Laterality: N/A;   BARIATRIC SURGERY  01/2021   lap band    BILIARY STENT PLACEMENT N/A 08/14/2021   Procedure: AXIOS STENT PLACEMENT;  Surgeon: Lemar Lofty., MD;  Location: [facility];  Service: Gastroenterology;  Laterality: N/A;   BIOPSY  07/08/2021   Procedure: BIOPSY;  Surgeon: Meridee Score Netty Starring., MD;  Location: [facility];  Service: Gastroenterology;;   BIOPSY  08/14/2021   Procedure: BIOPSY;   Surgeon: Lemar Lofty., MD;  Location: [facility];  Service: Gastroenterology;;   CARDIAC CATHETERIZATION N/A 05/08/2015   Procedure: Left Heart Cath and Coronary Angiography;  Surgeon: Corky Crafts, MD;  Location: [facility] INVASIVE CV LAB;  Service: Cardiovascular;  Laterality: N/A;   CARPAL TUNNEL RELEASE Left    CHOLECYSTECTOMY     COLONOSCOPY  06/27/2005   COLONOSCOPY  10/03/2014   CORONARY STENT INTERVENTION N/A 04/15/2021   Procedure: CORONARY STENT INTERVENTION;  Surgeon: Runell Gess, MD;  Location: [facility] INVASIVE CV LAB;  Service: Cardiovascular;  Laterality: N/A;   DUODENAL STENT PLACEMENT N/A 11/18/2021   Procedure: DUODENAL STENT PLACEMENT;  Surgeon: Meridee Score Netty Starring., MD;  Location: [facility];  Service: Gastroenterology;  Laterality: N/A;  axios placed at GJ anastamosis   DUODENAL STENT PLACEMENT  03/27/2022   Procedure: GASTRIC STENT PLACEMENT;  Surgeon: Meridee Score Netty Starring., MD;  Location: [facility] ENDOSCOPY;  Service: Gastroenterology;;   ESOPHAGEAL DILATION  05/27/2021   Procedure: ESOPHAGEAL DILATION;  Surgeon: Lemar Lofty., MD;  Location: [facility];  Service: Gastroenterology;;   ESOPHAGEAL DILATION  01/23/2022   Procedure: ESOPHAGEAL DILATION;  Surgeon: Lemar Lofty., MD;  Location: [facility] ENDOSCOPY;  Service: Gastroenterology;;   ESOPHAGEAL STENT PLACEMENT N/A 05/27/2021   Procedure: ESOPHAGEAL STENT PLACEMENT;  Surgeon: Lemar Lofty., MD;  Location: [facility];  Service: Gastroenterology;  Laterality: N/A;   ESOPHAGOGASTRODUODENOSCOPY  06/27/2005   ESOPHAGOGASTRODUODENOSCOPY N/A 03/19/2021   Procedure: ESOPHAGOGASTRODUODENOSCOPY (EGD);  Surgeon: Sheliah Hatch, De Blanch, MD;  Location: [facility]OSCOPY;  Service: General;  Laterality: N/A;   ESOPHAGOGASTRODUODENOSCOPY N/A 08/16/2021   Procedure: ESOPHAGOGASTRODUODENOSCOPY (EGD);  Surgeon: Lemar Lofty., MD;  Location: [facility] ENDOSCOPY;  Service: Gastroenterology;   Laterality: N/A;   ESOPHAGOGASTRODUODENOSCOPY (EGD) WITH PROPOFOL N/A 05/27/2021   Procedure: ESOPHAGOGASTRODUODENOSCOPY (EGD) WITH PROPOFOL;  Surgeon: Meridee Score Netty Starring., MD;  Location: [facility];  Service: Gastroenterology;  Laterality: N/A;   ESOPHAGOGASTRODUODENOSCOPY (EGD) WITH PROPOFOL N/A 07/08/2021   Procedure: ESOPHAGOGASTRODUODENOSCOPY (EGD) WITH PROPOFOL;  Surgeon: Meridee Score Netty Starring., MD;  Location: [facility];  Service: Gastroenterology;  Laterality: N/A;  fluoro   ESOPHAGOGASTRODUODENOSCOPY (EGD) WITH PROPOFOL N/A 08/14/2021   Procedure: ESOPHAGOGASTRODUODENOSCOPY (EGD) WITH PROPOFOL;  Surgeon: Meridee Score Netty Starring., MD;  Location: [facility];  Service: Gastroenterology;  Laterality: N/A;  fluoro Axios stent (20 mm)   ESOPHAGOGASTRODUODENOSCOPY (EGD) WITH PROPOFOL N/A 10/16/2021   Procedure: ESOPHAGOGASTRODUODENOSCOPY (EGD) WITH PROPOFOL;  Surgeon: Meridee Score Netty Starring., MD;  Location: [facility];  Service: Gastroenterology;  Laterality: N/A;  axios stent pull fluoro   ESOPHAGOGASTRODUODENOSCOPY (EGD) WITH PROPOFOL N/A 11/18/2021   Procedure: ESOPHAGOGASTRODUODENOSCOPY (EGD) WITH PROPOFOL;  Surgeon: Meridee Score Netty Starring., MD;  Location: [facility];  Service: Gastroenterology;  Laterality: N/A;   ESOPHAGOGASTRODUODENOSCOPY (EGD) WITH PROPOFOL N/A 01/23/2022   Procedure: ESOPHAGOGASTRODUODENOSCOPY (EGD) WITH PROPOFOL;  Surgeon: Meridee Score Netty Starring., MD;  Location: [facility] ENDOSCOPY;  Service: Gastroenterology;  Laterality: N/A;   ESOPHAGOGASTRODUODENOSCOPY (EGD) WITH PROPOFOL N/A 02/20/2022   Procedure: ESOPHAGOGASTRODUODENOSCOPY (EGD) WITH PROPOFOL;  Surgeon: Rachael Fee, MD;  Location: [facility] END[facility];  Service: Gastroenterology;  Laterality: N/A;   ESOPHAGOGASTRODUODENOSCOPY (EGD) WITH PROPOFOL N/A 03/27/2022   Procedure: ESOPHAGOGASTRODUODENOSCOPY (EGD) WITH PROPOFOL;  Surgeon: Meridee Score Netty Starring., MD;  Location: [facility] ENDOSCOPY;  Service: Gastroenterology;   Laterality: N/A;   FLEXIBLE SIGMOIDOSCOPY N/A 08/16/2021   Procedure: FLEXIBLE SIGMOIDOSCOPY;  Surgeon: Meridee Score Netty Starring., MD;  Location: [facility] ENDOSCOPY;  Service: Gastroenterology;  Laterality: N/A;   FOREIGN BODY REMOVAL  02/20/2022   Procedure: FOREIGN BODY REMOVAL;  Surgeon: Rachael Fee, MD;  Location: [facility] ENDOSCOPY;  Service: Gastroenterology;;   FOREIGN BODY REMOVAL ESOPHAGEAL  03/27/2022   Procedure: REMOVAL FOREIGN BODY;  Surgeon: Meridee Score Netty Starring., MD;  Location: [facility] ENDOSCOPY;  Service: Gastroenterology;;   GASTRIC ROUX-EN-Y N/A 02/18/2021   Procedure: LAPAROSCOPIC ROUX-EN-Y GASTRIC BYPASS WITH UPPER ENDOSCOPY,;  Surgeon: Sheliah Hatch De Blanch, MD;  Location: [facility] ORS;  Service: General;  Laterality: N/A;   GASTROINTESTINAL STENT REMOVAL  01/23/2022   Procedure: Daine Gip STENT REMOVAL;  Surgeon: Lemar Lofty., MD;  Location: [facility] ENDOSCOPY;  Service: Gastroenterology;;   HEMORRHOID SURGERY  08/25/1997   IR GJ TUBE CHANGE  03/27/2021   LAPAROSCOPIC INSERTION GASTROSTOMY TUBE N/A 03/20/2021   Procedure: LAPAROSCOPIC INSERTION GASTROSTOMY TUBE;  Surgeon: Rodman Pickle, MD;  Location: [facility] ORS;  Service: General;  Laterality: N/A;   LEFT HEART CATH AND CORONARY ANGIOGRAPHY N/A 04/15/2021   Procedure: LEFT HEART CATH AND CORONARY ANGIOGRAPHY;  Surgeon: Runell Gess, MD;  Location: [facility] INVASIVE CV LAB;  Service: Cardiovascular;  Laterality: N/A;   MOUTH SURGERY     removed area which was benign   STENT REMOVAL  07/08/2021   Procedure: STENT REMOVAL;  Surgeon: Lemar Lofty., MD;  Location: [facility] ENDOSCOPY;  Service: Gastroenterology;;   Francine Graven REMOVAL  10/16/2021   Procedure: STENT REMOVAL;  Surgeon: Lemar Lofty., MD;  Location: [facility] ENDOSCOPY;  Service: Gastroenterology;;   STERIOD INJECTION  01/23/2022   Procedure: STEROID INJECTION;  Surgeon: Lemar Lofty., MD;  Location[facility];  Service: Gastroenterology;;   SUBMUCOSAL INJECTION   11/18/2021   Procedure: SUBMUCOSAL INJECTION;  Surgeon: Lemar Lofty., MD;  Location: [facility]OSCOPY;  Service: Gastroenterology;;   TONSILLECTOMY     UPPER GI ENDOSCOPY N/A 03/20/2021   Procedure: UPPER GI ENDOSCOPY;  Surgeon: Kinsinger, De Blanch, MD;  Location: [facility] ORS;  Service: General;  Laterality: N/A;   Social History   Socioeconomic History   Marital status: Married    Spouse name: Not on file   Number of children: 3   Years of education: Not on file   Highest education level: Not on file  Occupational History   Occupation: IT IT trainer  Tobacco Use   Smoking status: Former    Packs/day: 1.00    Years: 15.00    Total pack years: 15.00    Types: Cigarettes    Quit date: 08/05/1989    Years since quitting: 32.9   Smokeless tobacco: Never  Vaping Use  Vaping Use: Never used  Substance and Sexual Activity   Alcohol use: Yes    Comment: occ   Drug use: Never   Sexual activity: Not on file  Other Topics Concern   Not on file  Social History Narrative   Not on file   Social Determinants of Health   Financial Resource Strain: Not on file  Food Insecurity: Not on file  Transportation Needs: Not on file  Physical Activity: Not on file  Stress: Not on file  Social Connections: Not on file  Intimate Partner Violence: Not on file   Family History  Problem Relation Age of Onset   Hypertension Mother    Diabetes Mother    Heart attack Father 39   CAD Father    Hypertension Sister    Diabetes Other    Hypertension Other    Heart disease Other    Colon cancer Neg Hx    Esophageal cancer Neg Hx    Pancreatic cancer Neg Hx    Stomach cancer Neg Hx    Liver disease Neg Hx    Inflammatory bowel disease Neg Hx    Rectal cancer Neg Hx    I have reviewed his medical, social, and family history in detail and updated the electronic medical record as necessary.    PHYSICAL EXAMINATION  BP 100/70 (BP Location: Left Arm, Patient Position: Sitting, Cuff Size:  Normal)   Pulse 76   Ht 5' 9.5" (1.765 m) Comment: height measured without shoes  Wt 224 lb 8 oz (101.8 kg)   BMI 32.68 kg/m  Wt Readings from Last 3 Encounters:  07/01/22 224 lb 8 oz (101.8 kg)  04/11/22 222 lb 3.2 oz (100.8 kg)  03/27/22 215 lb (97.5 kg)  GEN: NAD, appears stated age, doesn't appear chronically ill PSYCH: Cooperative, without pressured speech EYE: Conjunctivae pink, sclerae anicteric ENT: MMM CV: Nontachycardic RESP: No audible wheezing GI: NABS, soft, protuberant abdomen, surgical scars present, without rebound MSK/EXT: No significant lower extremity edema SKIN: No jaundice NEURO:  Alert & Oriented x 3, no focal deficits   REVIEW OF DATA  I reviewed the following data at the time of this encounter:  GI Procedures and Studies  August 2023 EGD - No gross lesions in esophagus. - 3 staples noted and removed. 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 Prosthesis placed. Dilated.  Laboratory Studies  Reviewed those in epic   Imaging Studies  August 2023 upper GI IMPRESSION: Anatomy consistent with Roux-en-y bypass. Stent within GJ anastomosis allows for non-impeded contrast passage into proximal bowel.   ASSESSMENT  Mr. Bise is a 61 y.o. 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  The patient is seen today for evaluation and management of:  1. Gastrojejunal anastomotic stricture   2. Dysphagia, unspecified type   3. Dysgeusia   4. Colon cancer screening   5. History of iron deficiency    The patient is hemodynamically and clinically stable at this time.  Unfortunately, this persisting anastomosis stricture has been very problematic  for the patient.  I have unfortunately no other endoscopic therapies that I can offer.  I think the only other endoscopic therapy that may be of use would be Stricturotomy attempt to try to salvage his bariatric surgery.  Otherwise revisional  bariatric surgery will need to be considered.  We had a long discussion about the length of time that his current cold AXIOS LAMS has been in place.  In normal circumstances we remove these at approximately 2 months because of the concern for tissue ingrowth and increased risk for bleeding.  This is a little bit less of an issue when we are not dealing with pancreatic pseudocysts but certainly something still we need to keep in mind and the patient accepts this increased risk.  For now he is going to keep the current [facility] appointment by we will reach out to my mentors to see if the individual that he is currently scheduled with does stricturotomy otherwise we will need to get the referral to the advanced endoscopy colleagues at [facility].  We will update the patient in the next 1 to 2 weeks with this information.  I suspect that prior to stricturotomy, I will need to remove his stent for at least 2 to 3 weeks before a stricturotomy so that there is enough time and safety for this to be pursued.  The etiology of the patient's dysgeusia remains unclear to me.  It has been an issue since his gastric bypass surgery.  He will maintain his current vitamins and supplements and his previous iron deficiency has improved.  We will not plan to pursue colonoscopy unless he has recurrent iron deficiency until 2026 when he is currently due based on prior outside colonoscopy.  All patient questions were answered to the best of my ability, and the patient agrees to the aforementioned plan of action with follow-up as indicated.  PLAN  Maintain AXIOS stent for now Keep current [facility] endoscopy referral but I will reach out to my mentors of advanced endoscopy to see if the individual he  is currently scheduled to see does stricturotomy otherwise will make sure referral goes to advanced endoscopy at [facility] Likely remove AXIOS stent a few weeks before the patient's stricturotomy attempt (if they agree) - Would need to get Plavix hold as we have done most recently Colon cancer screening still due in 2026 unless recurrent IDA is found in future   No orders of the defined types were placed in this encounter.   New Prescriptions   No medications on file   Modified Medications   No medications on file    Planned Follow Up No follow-ups on file.    Total Time in Face-to-Face and in Coordination of Care for patient including independent/personal interpretation/review of prior testing, medical history, examination, medication adjustment, communicating results with the patient directly, and documentation with the EHR is 25 minutes.  Corliss Parish, MD [facility] Advanced [facility]oscopy Office # [phone]

## 2022-07-09 ENCOUNTER — Encounter: Payer: Self-pay | Admitting: Gastroenterology

## 2022-07-10 NOTE — Progress Notes (Signed)
[facility] [address] [place], [place] [address]   CLINIC:  Medical Oncology/Hematology  PCP:  Marguarite Arbour, MD [address] [facility], [place] [address] [phone]   REASON FOR VISIT:  Follow-up for iron deficiency anemia  PRIOR THERAPY: Oral iron tablets  CURRENT THERAPY: Intermittent IV iron (last given June/July 2023)  INTERVAL HISTORY:  Hector Curry 61 y.o. male returns for routine follow-up of his iron deficiency anemia.  He was seen for initial consultation by Dr. Ellin Saba and Rojelio Brenner PA-C on 04/11/2022.  He reports mild fatigue with energy about 70%.  Reports episode of epistaxis lasting 25 minutes about 2 weeks ago, secondary to mechanical fall.  He denies any bright red blood per rectum or melena.  No pica, restless legs, headaches, chest pain, dyspnea on exertion, lightheadedness, or syncope. He has chronic palpitations, on propranolol.  He has 60-70% energy and 40% appetite. He endorses that he is maintaining a stable weight since his last appointment.   REVIEW OF SYSTEMS:    Review of Systems  Constitutional:  Positive for fatigue. Negative for appetite change, chills, diaphoresis, fever and unexpected weight change.  HENT:   Positive for trouble swallowing. Negative for lump/mass and nosebleeds.   Eyes:  Negative for eye problems.  Respiratory:  Negative for cough, hemoptysis and shortness of breath.   Cardiovascular:  Negative for chest pain, leg swelling and palpitations.  Gastrointestinal:  Positive for vomiting. Negative for abdominal pain, blood in stool, constipation, diarrhea and nausea.  Genitourinary:  Negative for hematuria.   Skin: Negative.   Neurological:  Negative for dizziness, headaches and light-headedness.  Hematological:  Does not bruise/bleed easily.      PAST MEDICAL/SURGICAL HISTORY:  Past Medical History:  Diagnosis Date   Allergic rhinitis    Atrial tachycardia    CAD  (coronary artery disease)    a. 05/08/2015 Cath: no significant CAD, LVEF nl-->Med; b. 07/2017 MV: attenuation artifact, no ischemia, EF 65%-->Low risk; c. 03/2021 NSTEMI/PCI: LM nl, LAD 80p (3.0x18 Onyx Frontier DES), D1 90 (too small for PCI), LCX nl, RCA nl.   Diabetes mellitus without complication (HCC)    Diastolic dysfunction    a. 04/2015 Echo: EF 60-65%; b. 05/2016 Echo: EF 60-65%, GrI DD; c. 07/2017 Echo: EF 55-60%, Ao root 42mm; d. 03/2019 Echo: EF 55-60%, Ao root/Asc Ao 42mm; e. 03/2021 Echo: EF 50-55%, apical HK, mod asymm basal-septal hypertrophy. Nl RV fxn. Asc Ao 44mm.   Dilated aortic root (HCC)    a. 07/2017 Echo: 42mm Ao root - mildly dil; b. 03/2019 Echo: Ao root 42mm; c. 03/2021 Echo: Asc Ao 44mm.   Diverticulosis 10/03/2014   Dysrhythmia    Esophageal stricture    a.  In setting of lap band June 2022-stricture at the GJ anastomosis requiring gastrostomy tube; b. 05/2021 s/p esoph stenting (15mm); c. 07/2021 s/p esoph stenting (20mm).   Family history of premature CAD    a. father passed from MI at 37   GERD (gastroesophageal reflux disease)    GIB (gastrointestinal bleeding)    a. 07/2021 following esoph stenting.   Hemorrhoids    a. internal hemorrhoids s/p surgery 1999   History of kidney stones    History of tobacco abuse    Hyperlipidemia    Hyperplastic colon polyp 10/03/2014   a. x 2    Hypertension    Inflammatory arthritis    a. CCP antibodies & x-rays negative. Rheumatoid factor 14, felt to be  crystaline over RA or psoriatic   Iron deficiency anemia 02/05/2022   Morbid obesity (HCC)    a. s/p LAP-BAND - complicated by esoph stricture.   OSA (obstructive sleep apnea)    a. on CPAP   Osteoarthritis    PSVT (paroxysmal supraventricular tachycardia)    a. 48 hr Holter 04/2015: NSR w/ rare PVC, short runs of narrow complex tachycardiac, possible atrial tach, longest run 7 beats, PACs noted (2% of all beats 3600 total) they did not seem to correlate w/ significant  arrythmia; b. 08/2017 Event monitor: no significant arrhythmias.   Past Surgical History:  Procedure Laterality Date   BALLOON DILATION N/A 08/14/2021   Procedure: BALLOON DILATION;  Surgeon: Mansouraty, Netty Starring., MD;  Location: [facility];  Service: Gastroenterology;  Laterality: N/A;   BALLOON DILATION N/A 02/20/2022   Procedure: BALLOON DILATION;  Surgeon: Rachael Fee, MD;  Location: [facility];  Service: Gastroenterology;  Laterality: N/A;   BALLOON DILATION N/A 03/27/2022   Procedure: BALLOON DILATION;  Surgeon: Meridee Score Netty Starring., MD;  Location: [facility] ENDOSCOPY;  Service: Gastroenterology;  Laterality: N/A;   BARIATRIC SURGERY  01/2021   lap band    BILIARY STENT PLACEMENT N/A 08/14/2021   Procedure: AXIOS STENT PLACEMENT;  Surgeon: Lemar Lofty., MD;  Location: [facility]COPY;  Service: Gastroenterology;  Laterality: N/A;   BIOPSY  07/08/2021   Procedure: BIOPSY;  Surgeon: Meridee Score Netty Starring., MD;  Location: [facility];  Service: Gastroenterology;;   BIOPSY  08/14/2021   Procedure: BIOPSY;  Surgeon: Lemar Lofty., MD;  Location: [facility];  Service: Gastroenterology;;   CARDIAC CATHETERIZATION N/A 05/08/2015   Procedure: Left Heart Cath and Coronary Angiography;  Surgeon: Corky Crafts, MD;  Location: [facility] INVASIVE CV LAB;  Service: Cardiovascular;  Laterality: N/A;   CARPAL TUNNEL RELEASE Left    CHOLECYSTECTOMY     COLONOSCOPY  06/27/2005   COLONOSCOPY  10/03/2014   CORONARY STENT INTERVENTION N/A 04/15/2021   Procedure: CORONARY STENT INTERVENTION;  Surgeon: Runell Gess, MD;  Location: [facility] INVASIVE CV LAB;  Service: Cardiovascular;  Laterality: N/A;   DUODENAL STENT PLACEMENT N/A 11/18/2021   Procedure: DUODENAL STENT PLACEMENT;  Surgeon: Meridee Score Netty Starring., MD;  Location: [facility] ENDOSCOPY;  Service: Gastroenterology;  Laterality: N/A;  axios placed at GJ anastamosis   DUODENAL STENT PLACEMENT  03/27/2022   Procedure: GASTRIC STENT  PLACEMENT;  Surgeon: Meridee Score Netty Starring., MD;  Location: [facility] ENDOSCOPY;  Service: Gastroenterology;;   ESOPHAGEAL DILATION  05/27/2021   Procedure: ESOPHAGEAL DILATION;  Surgeon: Lemar Lofty., MD;  Location: [facility];  Service: Gastroenterology;;   ESOPHAGEAL DILATION  01/23/2022   Procedure: ESOPHAGEAL DILATION;  Surgeon: Lemar Lofty., MD;  Location: [facility] ENDOSCOPY;  Service: Gastroenterology;;   ESOPHAGEAL STENT PLACEMENT N/A 05/27/2021   Procedure: ESOPHAGEAL STENT PLACEMENT;  Surgeon: Lemar Lofty., MD;  Location: [facility];  Service: Gastroenterology;  Laterality: N/A;   ESOPHAGOGASTRODUODENOSCOPY  06/27/2005   ESOPHAGOGASTRODUODENOSCOPY N/A 03/19/2021   Procedure: ESOPHAGOGASTRODUODENOSCOPY (EGD);  Surgeon: Sheliah Hatch, De Blanch, MD;  Location: [facility];  Service: General;  Laterality: N/A;   ESOPHAGOGASTRODUODENOSCOPY N/A 08/16/2021   Procedure: ESOPHAGOGASTRODUODENOSCOPY (EGD);  Surgeon: Lemar Lofty., MD;  Location: [facility] ENDOSCOPY;  Service: Gastroenterology;  Laterality: N/A;   ESOPHAGOGASTRODUODENOSCOPY (EGD) WITH PROPOFOL N/A 05/27/2021   Procedure: ESOPHAGOGASTRODUODENOSCOPY (EGD) WITH PROPOFOL;  Surgeon: Meridee Score Netty Starring., MD;  Location: [facility];  Service: Gastroenterology;  Laterality: N/A;   ESOPHAGOGASTRODUODENOSCOPY (EGD) WITH PROPOFOL N/A 07/08/2021   Procedure: ESOPHAGOGASTRODUODENOSCOPY (EGD) WITH  PROPOFOL;  Surgeon: Lemar Lofty., MD;  Location: [facility];  Service: Gastroenterology;  Laterality: N/A;  fluoro   ESOPHAGOGASTRODUODENOSCOPY (EGD) WITH PROPOFOL N/A 08/14/2021   Procedure: ESOPHAGOGASTRODUODENOSCOPY (EGD) WITH PROPOFOL;  Surgeon: Meridee Score Netty Starring., MD;  Location: [facility] ENDOSCOPY;  Service: Gastroenterology;  Laterality: N/A;  fluoro Axios stent (20 mm)   ESOPHAGOGASTRODUODENOSCOPY (EGD) WITH PROPOFOL N/A 10/16/2021   Procedure: ESOPHAGOGASTRODUODENOSCOPY (EGD) WITH PROPOFOL;  Surgeon:  Meridee Score Netty Starring., MD;  Location: [facility];  Service: Gastroenterology;  Laterality: N/A;  axios stent pull fluoro   ESOPHAGOGASTRODUODENOSCOPY (EGD) WITH PROPOFOL N/A 11/18/2021   Procedure: ESOPHAGOGASTRODUODENOSCOPY (EGD) WITH PROPOFOL;  Surgeon: Meridee Score Netty Starring., MD;  Location: [facility];  Service: Gastroenterology;  Laterality: N/A;   ESOPHAGOGASTRODUODENOSCOPY (EGD) WITH PROPOFOL N/A 01/23/2022   Procedure: ESOPHAGOGASTRODUODENOSCOPY (EGD) WITH PROPOFOL;  Surgeon: Meridee Score Netty Starring., MD;  Location: [facility] ENDOSCOPY;  Service: Gastroenterology;  Laterality: N/A;   ESOPHAGOGASTRODUODENOSCOPY (EGD) WITH PROPOFOL N/A 02/20/2022   Procedure: ESOPHAGOGASTRODUODENOSCOPY (EGD) WITH PROPOFOL;  Surgeon: Rachael Fee, MD;  Location: [facility];  Service: Gastroenterology;  Laterality: N/A;   ESOPHAGOGASTRODUODENOSCOPY (EGD) WITH PROPOFOL N/A 03/27/2022   Procedure: ESOPHAGOGASTRODUODENOSCOPY (EGD) WITH PROPOFOL;  Surgeon: Meridee Score Netty Starring., MD;  Location: [facility] ENDOSCOPY;  Service: Gastroenterology;  Laterality: N/A;   FLEXIBLE SIGMOIDOSCOPY N/A 08/16/2021   Procedure: FLEXIBLE SIGMOIDOSCOPY;  Surgeon: Meridee Score Netty Starring., MD;  Location: [facility] ENDOSCOPY;  Service: Gastroenterology;  Laterality: N/A;   FOREIGN BODY REMOVAL  02/20/2022   Procedure: FOREIGN BODY REMOVAL;  Surgeon: Rachael Fee, MD;  Location: [facility];  Service: Gastroenterology;;   FOREIGN BODY REMOVAL ESOPHAGEAL  03/27/2022   Procedure: REMOVAL FOREIGN BODY;  Surgeon: Meridee Score Netty Starring., MD;  Location: [facility] ENDOSCOPY;  Service: Gastroenterology;;   GASTRIC ROUX-EN-Y N/A 02/18/2021   Procedure: LAPAROSCOPIC ROUX-EN-Y GASTRIC BYPASS WITH UPPER ENDOSCOPY,;  Surgeon: Sheliah Hatch De Blanch, MD;  Location: [facility] ORS;  Service: General;  Laterality: N/A;   GASTROINTESTINAL STENT REMOVAL  01/23/2022   Procedure: Daine Gip STENT REMOVAL;  Surgeon: Lemar Lofty., MD;  Location: [facility] ENDOSCOPY;  Service:  Gastroenterology;;   HEMORRHOID SURGERY  08/25/1997   IR GJ TUBE CHANGE  03/27/2021   LAPAROSCOPIC INSERTION GASTROSTOMY TUBE N/A 03/20/2021   Procedure: LAPAROSCOPIC INSERTION GASTROSTOMY TUBE;  Surgeon: Rodman Pickle, MD;  Location: [facility] ORS;  Service: General;  Laterality: N/A;   LEFT HEART CATH AND CORONARY ANGIOGRAPHY N/A 04/15/2021   Procedure: LEFT HEART CATH AND CORONARY ANGIOGRAPHY;  Surgeon: Runell Gess, MD;  Location: [facility] INVASIVE CV LAB;  Service: Cardiovascular;  Laterality: N/A;   MOUTH SURGERY     removed area which was benign   STENT REMOVAL  07/08/2021   Procedure: STENT REMOVAL;  Surgeon: Lemar Lofty., MD;  Location: [facility];  Service: Gastroenterology;;   Francine Graven REMOVAL  10/16/2021   Procedure: STENT REMOVAL;  Surgeon: Lemar Lofty., MD;  Location: [facility];  Service: Gastroenterology;;   Karna Dupes INJECTION  01/23/2022   Procedure: STEROID INJECTION;  Surgeon: Lemar Lofty., MD;  Location: [facility] ENDOSCOPY;  Service: Gastroenterology;;   SUBMUCOSAL INJECTION  11/18/2021   Procedure: SUBMUCOSAL INJECTION;  Surgeon: Lemar Lofty., MD;  Location: [facility];  Service: Gastroenterology;;   TONSILLECTOMY     UPPER GI ENDOSCOPY N/A 03/20/2021   Procedure: UPPER GI ENDOSCOPY;  Surgeon: Sheliah Hatch De Blanch, MD;  Location: [facility] ORS;  Service: General;  Laterality: N/A;     SOCIAL HISTORY:  Social History   Socioeconomic History   Marital status: Married  Spouse name: Not on file   Number of children: 3   Years of education: Not on file   Highest education level: Not on file  Occupational History   Occupation: IT IT trainer  Tobacco Use   Smoking status: Former    Packs/day: 1.00    Years: 15.00    Total pack years: 15.00    Types: Cigarettes    Quit date: 08/05/1989    Years since quitting: 32.9   Smokeless tobacco: Never  Vaping Use   Vaping Use: Never used  Substance and Sexual Activity   Alcohol use:  Yes    Comment: occ   Drug use: Never   Sexual activity: Not on file  Other Topics Concern   Not on file  Social History Narrative   Not on file   Social Determinants of Health   Financial Resource Strain: Not on file  Food Insecurity: Not on file  Transportation Needs: Not on file  Physical Activity: Not on file  Stress: Not on file  Social Connections: Not on file  Intimate Partner Violence: Not on file    FAMILY HISTORY:  Family History  Problem Relation Age of Onset   Hypertension Mother    Diabetes Mother    Heart attack Father 35   CAD Father    Hypertension Sister    Diabetes Other    Hypertension Other    Heart disease Other    Colon cancer Neg Hx    Esophageal cancer Neg Hx    Pancreatic cancer Neg Hx    Stomach cancer Neg Hx    Liver disease Neg Hx    Inflammatory bowel disease Neg Hx    Rectal cancer Neg Hx     CURRENT MEDICATIONS: Listed elsewhere in EMR.  Reviewed by me as of 04/11/2022.   ALLERGIES:  Allergies  Allergen Reactions   Other Other (See Comments)    Cats     PHYSICAL EXAM:     ECOG PERFORMANCE STATUS: 1 - Symptomatic but completely ambulatory  There were no vitals filed for this visit. There were no vitals filed for this visit. Physical Exam Constitutional:      Appearance: Normal appearance. He is obese.  HENT:     Head: Normocephalic and atraumatic.     Mouth/Throat:     Mouth: Mucous membranes are moist.  Eyes:     Extraocular Movements: Extraocular movements intact.     Pupils: Pupils are equal, round, and reactive to light.  Cardiovascular:     Rate and Rhythm: Normal rate and regular rhythm.     Pulses: Normal pulses.     Heart sounds: Normal heart sounds.  Pulmonary:     Effort: Pulmonary effort is normal.     Breath sounds: Normal breath sounds.  Abdominal:     General: Bowel sounds are normal.     Palpations: Abdomen is soft.     Tenderness: There is no abdominal tenderness.  Musculoskeletal:         General: No swelling.     Right lower leg: No edema.     Left lower leg: No edema.  Lymphadenopathy:     Cervical: No cervical adenopathy.  Skin:    General: Skin is warm and dry.  Neurological:     General: No focal deficit present.     Mental Status: He is alert and oriented to person, place, and time.  Psychiatric:        Mood and Affect: Mood normal.  Behavior: Behavior normal.     LABORATORY DATA:  I have reviewed the labs as listed.  CBC    Component Value Date/Time   WBC 9.0 07/04/2022 1021   RBC 5.08 07/04/2022 1021   HGB 15.3 07/04/2022 1021   HCT 45.7 07/04/2022 1021   PLT 173 07/04/2022 1021   MCV 90.0 07/04/2022 1021   MCH 30.1 07/04/2022 1021   MCHC 33.5 07/04/2022 1021   RDW 13.0 07/04/2022 1021   LYMPHSABS 1.5 07/04/2022 1021   MONOABS 0.8 07/04/2022 1021   EOSABS 0.3 07/04/2022 1021   BASOSABS 0.0 07/04/2022 1021      Latest Ref Rng & Units 03/13/2022    8:03 AM 08/16/2021    2:36 AM 08/15/2021    9:15 AM  CMP  Glucose 70 - 99 mg/dL 062143  376178  [id number]   BUN 6 - 20 mg/dL 6  20  25    Creatinine 0.61 - 1.24 mg/dL 1.510.53  7.610.78  6.070.74   Sodium 135 - 145 mmol/L 139  137  137   Potassium 3.5 - 5.1 mmol/L 4.5  3.9  4.7   Chloride 98 - 111 mmol/L 102  104  101   CO2 22 - 32 mmol/L 30  29  25    Calcium 8.9 - 10.3 mg/dL 9.0  8.4  8.4   Total Protein 6.5 - 8.1 g/dL 7.0  5.2  5.7   Total Bilirubin 0.3 - 1.2 mg/dL 0.9  0.5  0.6   Alkaline Phos 38 - 126 U/L 99  64  73   AST 15 - 41 U/L 49  17  23   ALT 0 - 44 U/L 88  21  26     DIAGNOSTIC IMAGING:  I have independently reviewed the relevant imaging and discussed with the patient.  ASSESSMENT & PLAN: 1.  Iron deficiency anemia - Seen  at the request of his primary care provider (Dr. Aram BeechamJeffrey Sparks) for evaluation and treatment of iron deficiency anemia with IV iron infusions requested. - Gastric Roux-en-Y surgery in June 2022, complicated by gastrojejunal anastomotic stricture requiring multiple stents -  Hospitalized from 08/15/2021 through 08/17/2021 due to upper GI bleed thought to be related to placement of GJ stent.  During hospitalization, Hgb dropped to 7.8, transfused 1 unit PRBC and IV iron (Ferrlecit 250 mg) - Most recent EGD (03/27/2022) was performed for staple removal and treatment of gastric stenosis with prosthesis.  No bleeding was noted.  He continues to follow closely with Dr. Meridee ScoreMansouraty, his gastroenterologist. - Normal Hgb prior to December 2022, lowest Hgb 7.8 (08/16/2021) - Hematology work-up (02/05/2022): Normal B12, folate, copper, methylmalonic acid, homocystine. Normal LDH, reticulocytes, SPEP. - He has been taking ferrous sulfate 325 mg twice daily since last year.  Currently taking Plavix, Protonix, and Carafate. - Received IV Venofer 300 mg x 3 from 02/05/2022 through 02/26/2022 - Most recent labs (07/04/2022): Hgb 15.3, ferritin 81, iron saturation 23% - Patient denies any recent hematemesis, hematochezia, or melena.   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 - PLAN: No IV iron needed at this time - Repeat CBC/iron panel and RTC in 6 months, or sooner if needed based on symptoms - Patient can continue to take oral iron supplement ONCE daily dosing, at least 2 hours separate from his PPI and Carafate   2.  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  He lives at home with his wife.  He has a remote history of tobacco use, smoked 1 PPD cigarettes for 9 years, but quit in 1991.  He denies any alcohol or illicit drug use. -- FAMILY:  Maternal grandfather with pancreatic cancer.   Patient's sister also has iron deficiency anemia secondary to history of gastric bypass surgery.   PLAN SUMMARY: >> Labs in 3 months (CBC/D, ferritin, iron/TIBC) >> Office visit 1 day after labs    All questions were answered. The patient knows to call the clinic with any problems, questions or concerns.  Medical decision making: Low  Time spent on visit: I spent 15 minutes counseling the patient face to face. The total time spent in the appointment was 22 minutes and more than 50% was on counseling.   Carnella Guadalajara, PA-C  07/11/2022 9:28 AM

## 2022-07-11 ENCOUNTER — Inpatient Hospital Stay (HOSPITAL_BASED_OUTPATIENT_CLINIC_OR_DEPARTMENT_OTHER): Payer: No Typology Code available for payment source | Admitting: Physician Assistant

## 2022-07-11 VITALS — BP 111/75 | HR 81 | Temp 97.9°F | Resp 16 | Wt 227.3 lb

## 2022-07-11 DIAGNOSIS — D509 Iron deficiency anemia, unspecified: Secondary | ICD-10-CM | POA: Diagnosis not present

## 2022-07-11 DIAGNOSIS — D508 Other iron deficiency anemias: Secondary | ICD-10-CM

## 2022-07-11 NOTE — Patient Instructions (Signed)
[facility] at [facility] **VISIT SUMMARY & IMPORTANT INSTRUCTIONS **   You were seen today by Rojelio Brenner PA-C for your follow-up visit.    Iron deficiency anemia Your blood and iron levels look great! You do not need any IV iron at this time. Continue to take your iron tablet once daily, at least 2 hours before your acid blocking medication and Carafate. We will recheck your labs and see you for follow-up visit in 6 months. HOWEVER, if you feel any increased fatigue, have any episodes of possible blood loss, or any other concerning symptoms, please reach out beforehand and we will schedule you for labs and visit ahead of time if needed.  ** Thank you for trusting me with your healthcare!  I strive to provide all of my patients with quality care at each visit.  If you receive a survey for this visit, I would be so grateful to you for taking the time to provide feedback.  Thank you in advance!  ~ Harry Shuck                   Dr. Doreatha Massed   &   Rojelio Brenner, PA-C   - - - - - - - - - - - - - - - - - -    Thank you for choosing [facility] at [facility] to provide your oncology and hematology care.  To afford each patient quality time with our provider, please arrive at least 15 minutes before your scheduled appointment time.   If you have a lab appointment with the Cancer Center please come in thru the Main Entrance and check in at the main information desk.  You need to re-schedule your appointment should you arrive 10 or more minutes late.  We strive to give you quality time with our providers, and arriving late affects you and other patients whose appointments are after yours.  Also, if you no show three or more times for appointments you may be dismissed from the clinic at the providers discretion.     Again, thank you for choosing [facility].  Our hope is that these requests will decrease the amount of time  that you wait before being seen by our physicians.       _____________________________________________________________  Should you have questions after your visit to [facility], please contact our office at [phone] and follow the prompts.  Our office hours are 8:00 a.m. and 4:30 p.m. Monday - Friday.  Please note that voicemails left after 4:00 p.m. may not be returned until the following business day.  We are closed weekends and major holidays.  You do have access to a nurse 24-7, just call the main number to the clinic [phone] and do not press any options, hold on the line and a nurse will answer the phone.    For prescription refill requests, have your pharmacy contact our office and allow 72 hours.

## 2022-07-13 ENCOUNTER — Ambulatory Visit
Admission: EM | Admit: 2022-07-13 | Discharge: 2022-07-13 | Disposition: A | Discharge status: Home / Self Care | Payer: No Typology Code available for payment source | Attending: Emergency Medicine | Admitting: Emergency Medicine

## 2022-07-13 DIAGNOSIS — J029 Acute pharyngitis, unspecified: Secondary | ICD-10-CM | POA: Diagnosis present

## 2022-07-13 DIAGNOSIS — R590 Localized enlarged lymph nodes: Secondary | ICD-10-CM | POA: Diagnosis present

## 2022-07-13 DIAGNOSIS — H9202 Otalgia, left ear: Secondary | ICD-10-CM | POA: Diagnosis present

## 2022-07-13 LAB — CBC WITH DIFFERENTIAL/PLATELET
Abs Immature Granulocytes: 0.04 10*3/uL (ref 0.00–0.07)
Basophils Absolute: 0 10*3/uL (ref 0.0–0.1)
Basophils Relative: 0 %
Eosinophils Absolute: 0.4 10*3/uL (ref 0.0–0.5)
Eosinophils Relative: 4 %
HCT: 45.3 % (ref 39.0–52.0)
Hemoglobin: 15.2 g/dL (ref 13.0–17.0)
Immature Granulocytes: 0 %
Lymphocytes Relative: 17 %
Lymphs Abs: 1.7 10*3/uL (ref 0.7–4.0)
MCH: 30.1 pg (ref 26.0–34.0)
MCHC: 33.6 g/dL (ref 30.0–36.0)
MCV: 89.7 fL (ref 80.0–100.0)
Monocytes Absolute: 0.9 10*3/uL (ref 0.1–1.0)
Monocytes Relative: 8 %
Neutro Abs: 7 10*3/uL (ref 1.7–7.7)
Neutrophils Relative %: 71 %
Platelets: 200 10*3/uL (ref 150–400)
RBC: 5.05 MIL/uL (ref 4.22–5.81)
RDW: 13.2 % (ref 11.5–15.5)
WBC: 10.1 10*3/uL (ref 4.0–10.5)
nRBC: 0 % (ref 0.0–0.2)

## 2022-07-13 LAB — GROUP A STREP BY PCR: Group A Strep by PCR: NOT DETECTED

## 2022-07-13 LAB — MONONUCLEOSIS SCREEN: Mono Screen: NEGATIVE

## 2022-07-13 NOTE — Discharge Instructions (Signed)
I will contact you if your mono CBC come back abnormal.  In the meantime, make sure you drink plenty of extra fluids.  Some people find salt water gargles and  Tr[employer] Medic[employer]'s "Throat Coat" tea helpful. Take 5 mL of liquid Benadryl and 5 mL of Maalox. Mix it together, and then hold it in your mouth for as long as you can and then swallow. You may do this 4 times a day.  Honey and lemon dissolved in hot water can also be soothing.  Go to www.goodrx.com  or www.costplusdrugs.com to look up your medications. This will give you a list of where you can find your prescriptions at the most affordable prices. Or ask the pharmacist what the cash price is, or if they have any other discount programs available to help make your medication more affordable. This can be less expensive than what you would pay with insurance.

## 2022-07-13 NOTE — ED Triage Notes (Signed)
Patient presents to [facility] for intermittent sore throat x 3 weeks. Treating with dayquil and nyquil. States he has a hx of seasonal allergies.

## 2022-07-13 NOTE — ED Provider Notes (Signed)
HPI  SUBJECTIVE:  Patient reports sore throat starting 3 weeks ago.  He has been taking DayQuil, NyQuil, Allegra, Zyrtec, Flonase, and is not sure which one is working.  No aggravating factors.  He also reports left ear pain during this time starting after swimming.  He states that the pain radiates into his throat.  No change in hearing, otorrhea, popping or clicking, pain with opening his mouth fully.  He uses Q-tips.  There are no aggravating or alleviating factors to this ear pain.  + States that he has felt feverish, but has not measured his temperature + Intermittently swollen neck glands   No neck stiffness  No Cough, wheezing +nasal congestion,, clear rhinorrhea No postnasal drip, sinus pain or pressure No Myalgias No Headache No Rash  No loss of taste or smell No shortness of breath or difficulty breathing No nausea, vomiting No diarrhea No abdominal pain     No reflux sxs No Allergy sxs  No Breathing difficulty, voice changes, sensation of throat swelling shut No Drooling No Trismus No abx in past month.  No antipyretic in past 6 hrs  Patient has an extensive medical history including coronary artery disease, diabetes, esophageal stricture, GERD, GI bleed, PSVT, hyperlipidemia, hypertension, NSTEMI, status post gastric bypass, tonsillectomy PCP: [facility].  Past Medical History:  Diagnosis Date   Allergic rhinitis    Atrial tachycardia    CAD (coronary artery disease)    a. 05/08/2015 Cath: no significant CAD, LVEF nl-->Med; b. 07/2017 MV: attenuation artifact, no ischemia, EF 65%-->Low risk; c. 03/2021 NSTEMI/PCI: LM nl, LAD 80p (3.0x18 Onyx Frontier DES), D1 90 (too small for PCI), LCX nl, RCA nl.   Diabetes mellitus without complication (HCC)    Diastolic dysfunction    a. 04/2015 Echo: EF 60-65%; b. 05/2016 Echo: EF 60-65%, GrI DD; c. 07/2017 Echo: EF 55-60%, Ao root 42mm; d. 03/2019 Echo: EF 55-60%, Ao root/Asc Ao 42mm; e. 03/2021 Echo: EF 50-55%, apical HK,  mod asymm basal-septal hypertrophy. Nl RV fxn. Asc Ao 44mm.   Dilated aortic root (HCC)    a. 07/2017 Echo: 42mm Ao root - mildly dil; b. 03/2019 Echo: Ao root 42mm; c. 03/2021 Echo: Asc Ao 44mm.   Diverticulosis 10/03/2014   Dysrhythmia    Esophageal stricture    a.  In setting of lap band June 2022-stricture at the GJ anastomosis requiring gastrostomy tube; b. 05/2021 s/p esoph stenting (15mm); c. 07/2021 s/p esoph stenting (20mm).   Family history of premature CAD    a. father passed from MI at 77   GERD (gastroesophageal reflux disease)    GIB (gastrointestinal bleeding)    a. 07/2021 following esoph stenting.   Hemorrhoids    a. internal hemorrhoids s/p surgery 1999   History of kidney stones    History of tobacco abuse    Hyperlipidemia    Hyperplastic colon polyp 10/03/2014   a. x 2    Hypertension    Inflammatory arthritis    a. CCP antibodies & x-rays negative. Rheumatoid factor 14, felt to be crystaline over RA or psoriatic   Iron deficiency anemia 02/05/2022   Morbid obesity (HCC)    a. s/p LAP-BAND - complicated by esoph stricture.   OSA (obstructive sleep apnea)    a. on CPAP   Osteoarthritis    PSVT (paroxysmal supraventricular tachycardia)    a. 48 hr Holter 04/2015: NSR w/ rare PVC, short runs of narrow complex tachycardiac, possible atrial tach, longest run 7 beats, PACs noted (2%  of all beats 3600 total) they did not seem to correlate w/ significant arrythmia; b. 08/2017 Event monitor: no significant arrhythmias.    Past Surgical History:  Procedure Laterality Date   BALLOON DILATION N/A 08/14/2021   Procedure: BALLOON DILATION;  Surgeon: Mansouraty, Netty StarringGabriel Jr., MD;  [facility];  Service: Gastroenterology;  Laterality: N/A;   BALLOON DILATION N/A 02/20/2022   Procedure: BALLOON DILATION;  Surgeon: Rachael FeeJacobs, Daniel P, MD;  [facility];  Service: Gastroenterology;  Laterality: N/A;   BALLOON DILATION N/A 03/27/2022   Procedure: BALLOON DILATION;   Surgeon: Meridee ScoreMansouraty, Netty StarringGabriel Jr., MD;  [facility];  Service: Gastroenterology;  Laterality: N/A;   BARIATRIC SURGERY  01/2021   lap band    BILIARY STENT PLACEMENT N/A 08/14/2021   Procedure: AXIOS STENT PLACEMENT;  Surgeon: Lemar LoftyMansouraty, Gabriel Jr., MD;  [facility];  Service: Gastroenterology;  Laterality: N/A;   BIOPSY  07/08/2021   Procedure: BIOPSY;  Surgeon: Meridee ScoreMansouraty, Netty StarringGabriel Jr., MD;  [facility];  Service: Gastroenterology;;   BIOPSY  08/14/2021   Procedure: BIOPSY;  Surgeon: Lemar LoftyMansouraty, Gabriel Jr., MD;  [facility];  Service: Gastroenterology;;   CARDIAC CATHETERIZATION N/A 05/08/2015   Procedure: Left Heart Cath and Coronary Angiography;  Surgeon: Corky CraftsJayadeep S Varanasi, MD;  [facility];  Service: Cardiovascular;  Laterality: N/A;   CARPAL TUNNEL RELEASE Left    CHOLECYSTECTOMY     COLONOSCOPY  06/27/2005   COLONOSCOPY  10/03/2014   CORONARY STENT INTERVENTION N/A 04/15/2021   Procedure: CORONARY STENT INTERVENTION;  Surgeon: Runell GessBerry, Jonathan J, MD;  [facility];  Service: Cardiovascular;  Laterality: N/A;   DUODENAL STENT PLACEMENT N/A 11/18/2021   Procedure: DUODENAL STENT PLACEMENT;  Surgeon: Meridee ScoreMansouraty, Netty StarringGabriel Jr., MD;  [facility];  Service: Gastroenterology;  Laterality: N/A;  axios placed at GJ anastamosis   DUODENAL STENT PLACEMENT  03/27/2022   Procedure: GASTRIC STENT PLACEMENT;  Surgeon: Meridee ScoreMansouraty, Netty StarringGabriel Jr., MD;  [facility];  Service: Gastroenterology;;   ESOPHAGEAL DILATION  05/27/2021   Procedure: ESOPHAGEAL DILATION;  Surgeon: Lemar LoftyMansouraty, Gabriel Jr., MD;  [facility];  Service: Gastroenterology;;   ESOPHAGEAL DILATION  01/23/2022   Procedure: ESOPHAGEAL DILATION;  Surgeon: Lemar LoftyMansouraty, Gabriel Jr., MD;  [facility];  Service: Gastroenterology;;   ESOPHAGEAL STENT PLACEMENT N/A 05/27/2021   Procedure: ESOPHAGEAL STENT PLACEMENT;  Surgeon:  Lemar LoftyMansouraty, Gabriel Jr., MD;  [facility];  Service: Gastroenterology;  Laterality: N/A;   ESOPHAGOGASTRODUODENOSCOPY  06/27/2005   ESOPHAGOGASTRODUODENOSCOPY N/A 03/19/2021   Procedure: ESOPHAGOGASTRODUODENOSCOPY (EGD);  Surgeon: Sheliah HatchKinsinger, De BlanchLuke Aaron, MD;  [facility];  Service: General;  Laterality: N/A;   ESOPHAGOGASTRODUODENOSCOPY N/A 08/16/2021   Procedure: ESOPHAGOGASTRODUODENOSCOPY (EGD);  Surgeon: Lemar LoftyMansouraty, Gabriel Jr., MD;  [facility];  Service: Gastroenterology;  Laterality: N/A;   ESOPHAGOGASTRODUODENOSCOPY (EGD) WITH PROPOFOL N/A 05/27/2021   Procedure: ESOPHAGOGASTRODUODENOSCOPY (EGD) WITH PROPOFOL;  Surgeon: Meridee ScoreMansouraty, Netty StarringGabriel Jr., MD;  [facility];  Service: Gastroenterology;  Laterality: N/A;   ESOPHAGOGASTRODUODENOSCOPY (EGD) WITH PROPOFOL N/A 07/08/2021   Procedure: ESOPHAGOGASTRODUODENOSCOPY (EGD) WITH PROPOFOL;  Surgeon: Meridee ScoreMansouraty, Netty StarringGabriel Jr., MD;  [facility];  Service: Gastroenterology;  Laterality: N/A;  fluoro   ESOPHAGOGASTRODUODENOSCOPY (EGD) WITH PROPOFOL N/A 08/14/2021   Procedure: ESOPHAGOGASTRODUODENOSCOPY (EGD) WITH PROPOFOL;  Surgeon: Meridee ScoreMansouraty, Netty StarringGabriel Jr., MD;  [facility];  Service: Gastroenterology;  Laterality: N/A;  fluoro Axios stent (20 mm)   ESOPHAGOGASTRODUODENOSCOPY (EGD) WITH PROPOFOL N/A 10/16/2021   Procedure: ESOPHAGOGASTRODUODENOSCOPY (EGD) WITH PROPOFOL;  Surgeon: Lemar LoftyMansouraty, Gabriel Jr.,  MD;  [facility];  Service: Gastroenterology;  Laterality: N/A;  axios stent pull fluoro   ESOPHAGOGASTRODUODENOSCOPY (EGD) WITH PROPOFOL N/A 11/18/2021   Procedure: ESOPHAGOGASTRODUODENOSCOPY (EGD) WITH PROPOFOL;  Surgeon: Meridee Score Netty Starring., MD;  [facility];  Service: Gastroenterology;  Laterality: N/A;   ESOPHAGOGASTRODUODENOSCOPY (EGD) WITH PROPOFOL N/A 01/23/2022   Procedure: ESOPHAGOGASTRODUODENOSCOPY (EGD) WITH PROPOFOL;  Surgeon: Meridee Score Netty Starring., MD;   Location: [facility] ENDOSCOPY;  Service: Gastroenterology;  Laterality: N/A;   ESOPHAGOGASTRODUODENOSCOPY (EGD) WITH PROPOFOL N/A 02/20/2022   Procedure: ESOPHAGOGASTRODUODENOSCOPY (EGD) WITH PROPOFOL;  Surgeon: Rachael Fee, MD;  [facility];  Service: Gastroenterology;  Laterality: N/A;   ESOPHAGOGASTRODUODENOSCOPY (EGD) WITH PROPOFOL N/A 03/27/2022   Procedure: ESOPHAGOGASTRODUODENOSCOPY (EGD) WITH PROPOFOL;  Surgeon: Meridee Score Netty Starring., MD;  Location: [facility] END[facility]COPY;  Service: Gastroenterology;  Laterality: N/A;   FLEXIBLE SIGMOIDOSCOPY N/A 08/16/2021   Procedure: FLEXIBLE SIGMOIDOSCOPY;  Surgeon: Meridee Score Netty Starring., MD;  Location: [facility] END[facility];  Service: Gastroenterology;  Laterality: N/A;   FOREIGN BODY REMOVAL  02/20/2022   Procedure: FOREIGN BODY REMOVAL;  Surgeon: Rachael Fee, MD;  [facility];  Service: Gastroenterology;;   FOREIGN BODY REMOVAL ESOPHAGEAL  03/27/2022   Procedure: REMOVAL FOREIGN BODY;  Surgeon: Meridee Score Netty Starring., MD;  Location: [facility] ENDOSCOPY;  Service: Gastroenterology;;   GASTRIC ROUX-EN-Y N/A 02/18/2021   Procedure: LAPAROSCOPIC ROUX-EN-Y GASTRIC BYPASS WITH UPPER ENDOSCOPY,;  Surgeon: Sheliah Hatch De Blanch, MD;  [facility];  Service: General;  Laterality: N/A;   GASTROINTESTINAL STENT REMOVAL  01/23/2022   Procedure: Daine Gip STENT REMOVAL;  Surgeon: Lemar Lofty., MD;  Location: [facility] ENDOSCOPY;  Service: Gastroenterology;;   HEMORRHOID SURGERY  08/25/1997   IR GJ TUBE CHANGE  03/27/2021   LAPAROSCOPIC INSERTION GASTROSTOMY TUBE N/A 03/20/2021   Procedure: LAPAROSCOPIC INSERTION GASTROSTOMY TUBE;  Surgeon: Rodman Pickle, MD;  [facility];  Service: General;  Laterality: N/A;   LEFT HEART CATH AND CORONARY ANGIOGRAPHY N/A 04/15/2021   Procedure: LEFT HEART CATH AND CORONARY ANGIOGRAPHY;  Surgeon: Runell Gess, MD;  [facility];  Service: Cardiovascular;  Laterality: N/A;   MOUTH SURGERY      removed area which was benign   STENT REMOVAL  07/08/2021   Procedure: STENT REMOVAL;  Surgeon: Lemar Lofty., MD;  [facility];  Service: Gastroenterology;;   Francine Graven REMOVAL  10/16/2021   Procedure: STENT REMOVAL;  Surgeon: Lemar Lofty., MD;  [facility];  Service: Gastroenterology;;   STERIOD INJECTION  01/23/2022   Procedure: STEROID INJECTION;  Surgeon: Lemar Lofty., MD;  [facility];  Service: Gastroenterology;;   SUBMUCOSAL INJECTION  11/18/2021   Procedure: SUBMUCOSAL INJECTION;  Surgeon: Lemar Lofty., MD;  [facility];  Service: Gastroenterology;;   TONSILLECTOMY     UPPER GI ENDOSCOPY N/A 03/20/2021   Procedure: UPPER GI ENDOSCOPY;  Surgeon: Rodman Pickle, MD;  [facility];  Service: General;  Laterality: N/A;    Family History  Problem Relation Age of Onset   Hypertension Mother    Diabetes Mother    Heart attack Father 41   CAD Father    Hypertension Sister    Diabetes Other    Hypertension Other    Heart disease Other    Colon cancer Neg Hx    Esophageal cancer Neg Hx    Pancreatic cancer Neg Hx    Stomach cancer Neg Hx    Liver disease Neg Hx    Inflammatory bowel disease Neg Hx  Rectal cancer Neg Hx     Social History   Tobacco Use   Smoking status: Former    Packs/day: 1.00    Years: 15.00    Total pack years: 15.00    Types: Cigarettes    Quit date: 08/05/1989    Years since quitting: 32.9   Smokeless tobacco: Never  Vaping Use   Vaping Use: Never used  Substance Use Topics   Alcohol use: Yes    Comment: occ   Drug use: Never    No current facility-administered medications for this encounter.  Current Outpatient Medications:    ascorbic acid (VITAMIN C) 500 MG tablet, Take 1,000 mg by mouth daily., Disp: , Rfl:    atorvastatin (LIPITOR) 10 MG tablet, Take 1 tablet (10 mg total) by mouth daily., Disp: 90 tablet, Rfl: 2   b complex vitamins capsule,  Take by mouth daily., Disp: , Rfl:    Calcium Citrate-Vitamin D (BL CALCIUM CITRATE + D PO), Take 500 mg by mouth. With 500 IU of vitamin D, Disp: , Rfl:    clopidogrel (PLAVIX) 75 MG tablet, Take 1 tablet (75 mg total) by mouth daily., Disp: 90 tablet, Rfl: 0   Continuous Blood Gluc Sensor (FREESTYLE LIBRE 2 SENSOR) MISC, Use as directed every 14 days, Disp: 10 each, Rfl: 1   ferrous sulfate 325 (65 FE) MG tablet, Take 1 tablet by mouth 2 times daily with a meal., Disp: 600 tablet, Rfl: 0   fluticasone (FLONASE) 50 MCG/ACT nasal spray, Place 1 spray into both nostrils daily as needed for allergies or rhinitis., Disp: , Rfl:    metoprolol tartrate (LOPRESSOR) 50 MG tablet, Take 1 tablet by mouth 2  times daily., Disp: 180 tablet, Rfl: 3   mometasone (ELOCON) 0.1 % lotion, Apply to affected areas once daily, Disp: 60 mL, Rfl: 5   Multiple Vitamin (MULTI-VITAMIN DAILY PO), Take by mouth. Bariartic, Disp: , Rfl:    nitroGLYCERIN (NITROSTAT) 0.4 MG SL tablet, Place 1 tablet (0.4 mg total) under the tongue every 5 (five) minutes as needed for chest pain. (Patient not taking: Reported on 07/01/2022), Disp: 25 tablet, Rfl: 3   pantoprazole (PROTONIX) 40 MG tablet, Take 1 tablet (40 mg total) by mouth 2 (two) times daily before a meal., Disp: 60 tablet, Rfl: 12   Potassium Chloride ER 20 MEQ TBCR, Take 1 tablet by mouth 2 times daily, Disp: 60 tablet, Rfl: 5   PRESCRIPTION MEDICATION, at bedtime. Bipap, Disp: , Rfl:    Probiotic Product (PROBIOTIC ADVANCED PO), Take 1 capsule by mouth. And PREBIOTIC - bariatric, Disp: , Rfl:    propranolol (INDERAL) 10 MG tablet, Take 1 to 2 tablets by mouth 3 times daily as needed for tachycardia., Disp: 90 tablet, Rfl: 3   Propylene Glycol (SYSTANE BALANCE OP), Place 1 drop into both eyes daily as needed (dry eyes)., Disp: , Rfl:    SODIUM FLUORIDE 5000 SENSITIVE 1.1-5 % GEL, Place 1 application onto teeth daily., Disp: , Rfl:   Allergies  Allergen Reactions   Other  Other (See Comments)    Cats     ROS  As noted in HPI.   Physical Exam  BP 131/83 (BP Location: Left Arm)   Pulse 78   Temp 98.1 F (36.7 C) (Oral)   Resp 16   SpO2 94%   Constitutional: Well developed, well nourished, no acute distress Eyes:  EOMI, conjunctiva normal bilaterally HENT: Normocephalic, atraumatic,mucus membranes moist.  Mild nasal congestion.  Normal turbinates.  No maxillary, frontal sinus tenderness.  Tonsils surgically absent.  Normal oropharynx.  Uvula midline.  No postnasal drip, cobblestoning.  No drooling, trismus, muffled voice.  Left external ear, EAC, TM normal.  No pain with palpation of tragus, mastoid, traction on pinna.  No tenderness of the EAC.  TMJ nontender, no crepitus bilaterally.  Right TM normal. Respiratory: Normal inspiratory effort Cardiovascular: Normal rate, no murmurs, rubs, gallops GI: nondistended, nontender. No appreciable splenomegaly skin: No rash, skin intact Lymph: Positive tender anterior cervical LN.  No posterior cervical lymphadenopathy Musculoskeletal: no deformities Neurologic: Alert & oriented x 3, no focal neuro deficits Psychiatric: Speech and behavior appropriate. .   ED Course   Medications - No data to display  Orders Placed This Encounter  Procedures   Group A Strep by PCR    Standing Status:   Standing    Number of Occurrences:   1    Order Specific Question:   Patient immune status    Answer:   Normal   CBC with Differential    Standing Status:   Standing    Number of Occurrences:   1   Mononucleosis screen    Standing Status:   Standing    Number of Occurrences:   1    Results for orders placed or performed during the hospital encounter of 07/13/22 (from the past 24 hour(s))  Group A Strep by PCR     Status: None   Collection Time: 07/13/22  8:46 AM   Specimen: Throat; Sterile Swab  Result Value Ref Range   Group A Strep by PCR NOT DETECTED NOT DETECTED  CBC with Differential     Status: None    Collection Time: 07/13/22  9:43 AM  Result Value Ref Range   WBC 10.1 4.0 - 10.5 K/uL   RBC 5.05 4.22 - 5.81 MIL/uL   Hemoglobin 15.2 13.0 - 17.0 g/dL   HCT 43.3 29.5 - 18.8 %   MCV 89.7 80.0 - 100.0 fL   MCH 30.1 26.0 - 34.0 pg   MCHC 33.6 30.0 - 36.0 g/dL   RDW 41.6 60.6 - 30.1 %   Platelets 200 150 - 400 K/uL   nRBC 0.0 0.0 - 0.2 %   Neutrophils Relative % 71 %   Neutro Abs 7.0 1.7 - 7.7 K/uL   Lymphocytes Relative 17 %   Lymphs Abs 1.7 0.7 - 4.0 K/uL   Monocytes Relative 8 %   Monocytes Absolute 0.9 0.1 - 1.0 K/uL   Eosinophils Relative 4 %   Eosinophils Absolute 0.4 0.0 - 0.5 K/uL   Basophils Relative 0 %   Basophils Absolute 0.0 0.0 - 0.1 K/uL   Immature Granulocytes 0 %   Abs Immature Granulocytes 0.04 0.00 - 0.07 K/uL   No results found.  ED Clinical Impression  1. Sore throat   2. Otalgia of left ear   3. Anterior cervical lymphadenopathy      ED Assessment/Plan    Strep PCR negative.  1.  Sore throat.  Checking CBC, mono due to the lymphadenopathy.  We will contact patient at [phone] if these are abnormal.  Benadryl/Maalox mixture. Patient to followup with PCP as scheduled in several weeks.  In the meantime, Benadryl/Maalox mixture.  CBC normal.  Mono negative.  Follow-up with PCP.  2.  Left otalgia.  No evidence of otitis media, otitis externa, shingles, TMJ arthralgia.  Unsure as to the etiology of this, could be referred pain from his throat.  Discussed labs,  MDM, plan and followup with patient. Discussed sn/sx that should prompt return to the ED. patient agrees with plan.   No orders of the defined types were placed in this encounter.    *This clinic note was created using Dragon dictation software. Therefore, there may be occasional mistakes despite careful proofreading.     Domenick Gong, MD 07/15/22 [phone]

## 2022-07-14 ENCOUNTER — Other Ambulatory Visit: Payer: Self-pay

## 2022-07-14 DIAGNOSIS — D649 Anemia, unspecified: Secondary | ICD-10-CM

## 2022-07-14 DIAGNOSIS — D508 Other iron deficiency anemias: Secondary | ICD-10-CM

## 2022-07-16 ENCOUNTER — Other Ambulatory Visit (HOSPITAL_COMMUNITY): Payer: Self-pay

## 2022-07-18 ENCOUNTER — Other Ambulatory Visit (HOSPITAL_COMMUNITY): Payer: Self-pay

## 2022-07-18 ENCOUNTER — Encounter (HOSPITAL_COMMUNITY): Payer: Self-pay | Admitting: Hematology

## 2022-07-21 ENCOUNTER — Encounter: Payer: Self-pay | Admitting: Gastroenterology

## 2022-07-21 NOTE — Telephone Encounter (Addendum)
[name], [facility] told me that he was scheduled for that particular day. I need to remove his stent at 2 weeks before that particular date. This will be a scheduled as a typical EGD time.   Please place this for one of my hospital dates. Thanks. GM

## 2022-07-21 NOTE — Telephone Encounter (Signed)
Dr Mansouraty please advise  

## 2022-07-22 ENCOUNTER — Telehealth: Payer: Self-pay

## 2022-07-22 NOTE — Telephone Encounter (Signed)
Mansouraty, Netty Starring., MD  Loretha Stapler, RN      Previous Messages    ----- Message ----- From: Loretha Stapler, RN Sent: 07/21/2022   3:56 PM EST To: Lemar Lofty., MD Subject: Jan 2nd appointment                            ----- Message from Loretha Stapler, RN sent at 07/21/2022  3:56 PM EST -----    ----- Message from [name] to Mansouraty, Netty Starring., MD sent at 07/21/2022  3:40 PM ----- No.  I need the Stent removed.  Dr.M and I discussed this on my last visit with him.   ----- Message -----      From: (proxy for Nurse Miryah Ralls P)      Sent:07/21/2022  3:38 PM EST        XB:MWUX K Leite   Subject:Jan 2nd appointment  Are you referring to an office visit?   ----- Message -----      From:Bayden K Coulon      Sent:07/21/2022  2:49 PM EST        LK:[id number] Argentina Donovan, MD   Subject:Jan 2nd appointment  I have a Jan 2nd appointment at [facility] to have a consultation at 0800 and the procedure at 0845.  I need to have my stent taken out.  Please schedule an appointment 3 weeks prior to Jan 2nd.  Once that is scheduled, I will back up 5 days from your procedure and stop taking Plavix.  Thanks.  Jan 2nd appointment (Newest Message First) View All Conversations on this Encounter  Mansouraty, Netty Starring., MD  You16 hours ago (4:02 PM)    Alexia Freestone, [facility] told me that he was scheduled for that particular day. I need to remove his stent at 2 weeks before that particular date. This will be a scheduled as a typical EGD time.   Please place this for one of my hospital dates. Thanks. GM      Note   You routed conversation to Mansouraty, Netty Starring., MD16 hours ago (3:56 PM)   You16 hours ago (3:56 PM)    Dr Meridee Score please advise       Note   Epifanio Labrador Shek  P [facility] Clinical Pool (supporting Lemar Lofty., MD)17 hours ago (3:40 PM)    No.  I need the Stent removed.  Dr.M and I discussed this on my last visit  with him.    You  Abran Cantor Tranbarger17 hours ago (3:38 PM)    Are you referring to an office visit?    Abran Cantor Stuhr  P [facility] Clinical Pool (supporting Lemar Lofty., MD)18 hours ago (2:49 PM)    I have a Jan 2nd appointment at [facility] to have a consultation at 0800 and the procedure at 0845.  I need to have my stent taken out.  Please schedule an appointment 3 weeks prior to Jan 2nd.  Once that is scheduled, I will back up 5 days from your procedure and stop taking Plavix.  Thanks.

## 2022-07-23 ENCOUNTER — Other Ambulatory Visit: Payer: Self-pay

## 2022-07-23 DIAGNOSIS — K9189 Other postprocedural complications and disorders of digestive system: Secondary | ICD-10-CM

## 2022-07-23 DIAGNOSIS — R1310 Dysphagia, unspecified: Secondary | ICD-10-CM

## 2022-07-23 NOTE — Telephone Encounter (Signed)
The pt has been scheduled for 08/14/22 at 130 pm EGD at [facility]  All information has been sent to the pt via My Chart.  Per his communication preference.

## 2022-07-28 ENCOUNTER — Other Ambulatory Visit (HOSPITAL_COMMUNITY): Payer: Self-pay

## 2022-07-29 ENCOUNTER — Other Ambulatory Visit (HOSPITAL_COMMUNITY): Payer: Self-pay

## 2022-07-31 ENCOUNTER — Other Ambulatory Visit (HOSPITAL_COMMUNITY): Payer: Self-pay

## 2022-08-07 ENCOUNTER — Encounter (HOSPITAL_COMMUNITY): Payer: Self-pay | Admitting: Gastroenterology

## 2022-08-07 NOTE — Progress Notes (Signed)
Attempted to obtain medical history via telephone, unable to reach at this time. HIPAA compliant voicemail message left requesting return call to pre surgical testing department. 

## 2022-08-09 ENCOUNTER — Other Ambulatory Visit (HOSPITAL_COMMUNITY): Payer: Self-pay

## 2022-08-13 ENCOUNTER — Other Ambulatory Visit (HOSPITAL_COMMUNITY): Payer: Self-pay

## 2022-08-14 ENCOUNTER — Other Ambulatory Visit (HOSPITAL_COMMUNITY): Payer: Self-pay

## 2022-08-14 ENCOUNTER — Other Ambulatory Visit: Payer: Self-pay

## 2022-08-14 ENCOUNTER — Encounter (HOSPITAL_COMMUNITY): Payer: Self-pay | Admitting: Gastroenterology

## 2022-08-14 ENCOUNTER — Ambulatory Visit (HOSPITAL_BASED_OUTPATIENT_CLINIC_OR_DEPARTMENT_OTHER): Payer: No Typology Code available for payment source | Admitting: Certified Registered Nurse Anesthetist

## 2022-08-14 ENCOUNTER — Ambulatory Visit (HOSPITAL_COMMUNITY): Payer: No Typology Code available for payment source | Admitting: Certified Registered Nurse Anesthetist

## 2022-08-14 ENCOUNTER — Ambulatory Visit (HOSPITAL_COMMUNITY)
Admission: RE | Admit: 2022-08-14 | Discharge: 2022-08-14 | Disposition: A | Discharge status: Home / Self Care | Payer: No Typology Code available for payment source | Source: Ambulatory Visit | Attending: Gastroenterology | Admitting: Gastroenterology

## 2022-08-14 ENCOUNTER — Encounter (HOSPITAL_COMMUNITY)
Admission: RE | Disposition: A | Discharge status: Home / Self Care | Payer: Self-pay | Source: Ambulatory Visit | Attending: Gastroenterology

## 2022-08-14 DIAGNOSIS — I2510 Atherosclerotic heart disease of native coronary artery without angina pectoris: Secondary | ICD-10-CM | POA: Insufficient documentation

## 2022-08-14 DIAGNOSIS — I10 Essential (primary) hypertension: Secondary | ICD-10-CM | POA: Diagnosis not present

## 2022-08-14 DIAGNOSIS — K2970 Gastritis, unspecified, without bleeding: Secondary | ICD-10-CM

## 2022-08-14 DIAGNOSIS — Z9884 Bariatric surgery status: Secondary | ICD-10-CM | POA: Insufficient documentation

## 2022-08-14 DIAGNOSIS — I252 Old myocardial infarction: Secondary | ICD-10-CM | POA: Insufficient documentation

## 2022-08-14 DIAGNOSIS — Z4659 Encounter for fitting and adjustment of other gastrointestinal appliance and device: Secondary | ICD-10-CM

## 2022-08-14 DIAGNOSIS — Z87891 Personal history of nicotine dependence: Secondary | ICD-10-CM | POA: Insufficient documentation

## 2022-08-14 DIAGNOSIS — Y838 Other surgical procedures as the cause of abnormal reaction of the patient, or of later complication, without mention of misadventure at the time of the procedure: Secondary | ICD-10-CM | POA: Diagnosis not present

## 2022-08-14 DIAGNOSIS — Z955 Presence of coronary angioplasty implant and graft: Secondary | ICD-10-CM | POA: Insufficient documentation

## 2022-08-14 DIAGNOSIS — Z6828 Body mass index (BMI) 28.0-28.9, adult: Secondary | ICD-10-CM | POA: Insufficient documentation

## 2022-08-14 DIAGNOSIS — K9589 Other complications of other bariatric procedure: Secondary | ICD-10-CM | POA: Insufficient documentation

## 2022-08-14 DIAGNOSIS — K3189 Other diseases of stomach and duodenum: Secondary | ICD-10-CM

## 2022-08-14 DIAGNOSIS — I471 Supraventricular tachycardia, unspecified: Secondary | ICD-10-CM | POA: Diagnosis not present

## 2022-08-14 DIAGNOSIS — T182XXA Foreign body in stomach, initial encounter: Secondary | ICD-10-CM | POA: Diagnosis not present

## 2022-08-14 DIAGNOSIS — E119 Type 2 diabetes mellitus without complications: Secondary | ICD-10-CM | POA: Insufficient documentation

## 2022-08-14 DIAGNOSIS — W44E9XA Other non-magnetic metal objects entering into or through a natural orifice, initial encounter: Secondary | ICD-10-CM | POA: Insufficient documentation

## 2022-08-14 DIAGNOSIS — Z980 Intestinal bypass and anastomosis status: Secondary | ICD-10-CM | POA: Insufficient documentation

## 2022-08-14 HISTORY — PX: SUBMUCOSAL INJECTION: SHX5543

## 2022-08-14 HISTORY — PX: STENT REMOVAL: SHX6421

## 2022-08-14 HISTORY — PX: ESOPHAGOGASTRODUODENOSCOPY (EGD) WITH PROPOFOL: SHX5813

## 2022-08-14 LAB — GLUCOSE, CAPILLARY: Glucose-Capillary: 115 mg/dL — ABNORMAL HIGH (ref 70–99)

## 2022-08-14 SURGERY — ESOPHAGOGASTRODUODENOSCOPY (EGD) WITH PROPOFOL
Anesthesia: Monitor Anesthesia Care

## 2022-08-14 MED ORDER — LACTATED RINGERS IV SOLN: INTRAVENOUS | Status: DC

## 2022-08-14 MED ORDER — PROPOFOL 500 MG/50ML IV EMUL
INTRAVENOUS | Status: DC | PRN
  Administered 2022-08-14: 125 ug/kg/min via INTRAVENOUS

## 2022-08-14 MED ORDER — TRIAMCINOLONE ACETONIDE 40 MG/ML IJ SUSP
INTRAMUSCULAR | Status: AC
  Filled 2022-08-14: qty 1

## 2022-08-14 MED ORDER — LIDOCAINE 2% (20 MG/ML) 5 ML SYRINGE
INTRAMUSCULAR | Status: DC | PRN
  Administered 2022-08-14: 100 mg via INTRAVENOUS

## 2022-08-14 MED ORDER — ONDANSETRON HCL 4 MG/2ML IJ SOLN
INTRAMUSCULAR | Status: DC | PRN
  Administered 2022-08-14 (×2): 4 mg via INTRAVENOUS

## 2022-08-14 MED ORDER — PROPOFOL 10 MG/ML IV BOLUS
INTRAVENOUS | Status: DC | PRN
  Administered 2022-08-14: 30 mg via INTRAVENOUS
  Administered 2022-08-14: 70 mg via INTRAVENOUS

## 2022-08-14 MED ORDER — SUCRALFATE 1 G PO TABS: 1.0000 g | ORAL_TABLET | Freq: Four times a day (QID) | ORAL | 1 refills | Status: DC

## 2022-08-14 MED ORDER — PROPOFOL 500 MG/50ML IV EMUL
INTRAVENOUS | Status: AC
  Filled 2022-08-14: qty 50

## 2022-08-14 MED ORDER — TRIAMCINOLONE ACETONIDE 40 MG/ML IJ SUSP
INTRAMUSCULAR | Status: DC | PRN
  Administered 2022-08-14: 40 mg via INTRAMUSCULAR

## 2022-08-14 MED ORDER — CLOPIDOGREL BISULFATE 75 MG PO TABS: 75.0000 mg | ORAL_TABLET | Freq: Every day | ORAL | 0 refills | Status: DC

## 2022-08-14 MED ORDER — ASPIRIN 81 MG PO TBEC: 81.0000 mg | DELAYED_RELEASE_TABLET | Freq: Every day | ORAL | 2 refills | Status: DC

## 2022-08-14 SURGICAL SUPPLY — 15 items

## 2022-08-14 NOTE — Op Note (Signed)
[facility] Patient Name: Hector Curry Procedure Date: 08/14/2022 MRN: [id number] Attending MD: Justice Britain , MD, [id number] Date of Birth: May 23, 1961 CSN: [id number] Age: 61 Admit Type: Outpatient Procedure:                Upper GI endoscopy Indications:              Failure to respond to medical treatment, Foreign                            body in the stomach, Post-bariatric anastomotic                            stenosis, Follow-up of post-bariatric anastomotic                            stenosis Providers:                Justice Britain, MD, Carlyn Reichert, RN, Luan Moore, Technician, Heide Scales, CRNA Referring MD:             Lucilla Edin Branch, Mickeal Skinner MD,                            MD, Leonie Douglas. Doy Hutching, MD Medicines:                Monitored Anesthesia Care Complications:            No immediate complications. Estimated Blood Loss:     Estimated blood loss was minimal. Procedure:                Pre-Anesthesia Assessment:                           - Prior to the procedure, a History and Physical                            was performed, and patient medications and                            allergies were reviewed. The patient's tolerance of                            previous anesthesia was also reviewed. The risks                            and benefits of the procedure and the sedation                            options and risks were discussed with the patient.                            All questions were answered, and informed consent  was obtained. Prior Anticoagulants: The patient                            last took Plavix (clopidogrel) 5 days prior to the                            procedure and has taken no anticoagulant or                            antiplatelet agents except for aspirin. ASA Grade                            Assessment: III - A patient with  severe systemic                            disease. After reviewing the risks and benefits,                            the patient was deemed in satisfactory condition to                            undergo the procedure.                           After obtaining informed consent, the endoscope was                            passed under direct vision. Throughout the                            procedure, the patient's blood pressure, pulse, and                            oxygen saturations were monitored continuously. The                            [id number] ([id number]) Olympus therapeutic endoscope                            was introduced through the mouth, and advanced to                            the second part of duodenum. The upper GI endoscopy                            was accomplished without difficulty. The patient                            tolerated the procedure. Scope In: Scope Out: Findings:      No gross lesions were noted in the entire esophagus.      The Z-line was regular and was found 44 cm from the incisors.      A metal AXIOS stent was found in the stomach. Stent removal was  accomplished with a Raptor grasping device.      Moderate mucosal changes characterized by congestion, hemorrhagic       appearance, inflammation and altered texture were found in the gastric       pouch (from rencet stent having been in this place/position for the last       few months (kept in place longer due to his history of recurrent       issues). The total gastric pouch is 4 cm in length.      Evidence of a Roux-en-Y gastrojejunostomy was found at the anastomosis.       The gastrojejunal anastomosis was characterized by congestion, erythema,       friable mucosa, a hemorrhagic appearance, inflammation and a visible       suture (due to the recent AXIOS being in place). This was traversed. The       jejunojejunal anastomosis was characterized by healthy appearing mucosa.       The  duodenum-to-jejunum limb was not examined as it could not be       reached. The excluded stomach was not examined as it could not be       reached. Removal of the retained suture was accomplished with a regular       forceps. The anastomosis was then successfully injected with 1 mL of       triamcinolone (40 mg/mL) for An effort of decreasing risk of stenosis       recurrence. Impression:               - No gross lesions in the entire esophagus. Z-line                            regular, 44 cm from the incisors.                           - Pre-existing AXIOS gastric stent, removed.                           - Congested, hemorrhagic appearing, inflamed and                            texture changed mucosa in the gastric body. 4 cm                            gastric pouch noted.                           - Roux-en-Y gastrojejunostomy with gastrojejunal                            anastomosis characterized by congestion, erythema,                            friable mucosa, a hemorrhagic appearance,                            inflammation and visible sutures. Injected. Moderate Sedation:      Not Applicable - Patient had care per Anesthesia. Recommendation:           - The patient  will be observed post-procedure,                            until all discharge criteria are met.                           - Discharge patient to home.                           - Patient has a contact number available for                            emergencies. The signs and symptoms of potential                            delayed complications were discussed with the                            patient. Return to normal activities tomorrow.                            Written discharge instructions were provided to the                            patient.                           - Full liquid diet today.                           - Monitor diet closely and do no more than soft                            foods.                            - Day before repeat procedure at [facility], ensure you                            are on clear liquids the entire day.                           - Continue PPI twice daily.                           - Continue Carafate 2-4 times daily.                           - Restart Plavix on 12/23 to decrease post                            interventional bleeding risk.                           - The findings and recommendations were discussed  with the patient.                           - The findings and recommendations were discussed                            with the patient's family. Procedure Code(s):        --- Professional ---                           [id number], Esophagogastroduodenoscopy, flexible,                            transoral; with removal of foreign body(s) Diagnosis Code(s):        --- Professional ---                           Z97.8, Presence of other specified devices                           K92.2, Gastrointestinal hemorrhage, unspecified                           K29.70, Gastritis, unspecified, without bleeding                           K31.89, Other diseases of stomach and duodenum                           Z98.0, Intestinal bypass and anastomosis status                           T18.2XXA, Foreign body in stomach, initial encounter                           N02.72, Other complications of other bariatric                            procedure CPT copyright 2022 [employer]. All rights reserved. The codes documented in this report are preliminary and upon coder review may  be revised to meet current compliance requirements. Justice Britain, MD 08/14/2022 1:20:37 PM Number of Addenda: 0

## 2022-08-14 NOTE — H&P (Signed)
GASTROENTEROLOGY PROCEDURE H&P NOTE   Primary Care Physician: Marguarite Arbour, MD  HPI: Hector Curry is a 61 y.o. male who presents for EGD for stent pull in setting of recurrent GJ anastomosis stricture and upcoming attempt at stricturotomy.  Past Medical History:  Diagnosis Date   Allergic rhinitis    Atrial tachycardia    CAD (coronary artery disease)    a. 05/08/2015 Cath: no significant CAD, LVEF nl-->Med; b. 07/2017 MV: attenuation artifact, no ischemia, EF 65%-->Low risk; c. 03/2021 NSTEMI/PCI: LM nl, LAD 80p (3.0x18 Onyx Frontier DES), D1 90 (too small for PCI), LCX nl, RCA nl.   Diabetes mellitus without complication (HCC)    Diastolic dysfunction    a. 04/2015 Echo: EF 60-65%; b. 05/2016 Echo: EF 60-65%, GrI DD; c. 07/2017 Echo: EF 55-60%, Ao root 58mm; d. 03/2019 Echo: EF 55-60%, Ao root/Asc Ao 56mm; e. 03/2021 Echo: EF 50-55%, apical HK, mod asymm basal-septal hypertrophy. Nl RV fxn. Asc Ao 23mm.   Dilated aortic root (HCC)    a. 07/2017 Echo: 44mm Ao root - mildly dil; b. 03/2019 Echo: Ao root 66mm; c. 03/2021 Echo: Asc Ao 54mm.   Diverticulosis 10/03/2014   Dysrhythmia    Esophageal stricture    a.  In setting of lap band June 2022-stricture at the GJ anastomosis requiring gastrostomy tube; b. 05/2021 s/p esoph stenting (5mm); c. 07/2021 s/p esoph stenting (64mm).   Family history of premature CAD    a. father passed from MI at 61   GERD (gastroesophageal reflux disease)    GIB (gastrointestinal bleeding)    a. 07/2021 following esoph stenting.   Hemorrhoids    a. internal hemorrhoids s/p surgery 1999   History of kidney stones    History of tobacco abuse    Hyperlipidemia    Hyperplastic colon polyp 10/03/2014   a. x 2    Hypertension    Inflammatory arthritis    a. CCP antibodies & x-rays negative. Rheumatoid factor 14, felt to be crystaline over RA or psoriatic   Iron deficiency anemia 02/05/2022   Morbid obesity (HCC)    a. s/p LAP-BAND - complicated by  esoph stricture.   OSA (obstructive sleep apnea)    a. on CPAP   Osteoarthritis    PSVT (paroxysmal supraventricular tachycardia)    a. 48 hr Holter 04/2015: NSR w/ rare PVC, short runs of narrow complex tachycardiac, possible atrial tach, longest run 7 beats, PACs noted (2% of all beats 3600 total) they did not seem to correlate w/ significant arrythmia; b. 08/2017 Event monitor: no significant arrhythmias.   Past Surgical History:  Procedure Laterality Date   BALLOON DILATION N/A 08/14/2021   Procedure: BALLOON DILATION;  Surgeon: Mansouraty, Netty Starring., MD;  Location: [facility]OSCOPY;  Service: Gastroenterology;  Laterality: N/A;   BALLOON DILATION N/A 02/20/2022   Procedure: BALLOON DILATION;  Surgeon: Rachael Fee, MD;  Location: [facility]COPY;  Service: Gastroenterology;  Laterality: N/A;   BALLOON DILATION N/A 03/27/2022   Procedure: BALLOON DILATION;  Surgeon: Meridee Score Netty Starring., MD;  Location: [facility] ENDOSCOPY;  Service: Gastroenterology;  Laterality: N/A;   BARIATRIC SURGERY  01/2021   lap band    BILIARY STENT PLACEMENT N/A 08/14/2021   Procedure: AXIOS STENT PLACEMENT;  Surgeon: Lemar Lofty., MD;  Location: [facility] ENDOSCOPY;  Service: Gastroenterology;  Laterality: N/A;   BIOPSY  07/08/2021   Procedure: BIOPSY;  Surgeon: Lemar Lofty., MD;  Location: [facility] ENDOSCOPY;  Service: Gastroenterology;;   BIOPSY  08/14/2021  Procedure: BIOPSY;  Surgeon: Lemar Lofty., MD;  Location: [facility];  Service: Gastroenterology;;   CARDIAC CATHETERIZATION N/A 05/08/2015   Procedure: Left Heart Cath and Coronary Angiography;  Surgeon: Corky Crafts, MD;  Location: [facility] INVASIVE CV LAB;  Service: Cardiovascular;  Laterality: N/A;   CARPAL TUNNEL RELEASE Left    CHOLECYSTECTOMY     COLONOSCOPY  06/27/2005   COLONOSCOPY  10/03/2014   CORONARY STENT INTERVENTION N/A 04/15/2021   Procedure: CORONARY STENT INTERVENTION;  Surgeon: Runell Gess, MD;  Location: [facility]  INVASIVE CV LAB;  Service: Cardiovascular;  Laterality: N/A;   DUODENAL STENT PLACEMENT N/A 11/18/2021   Procedure: DUODENAL STENT PLACEMENT;  Surgeon: Meridee Score Netty Starring., MD;  Location: [facility] ENDOSCOPY;  Service: Gastroenterology;  Laterality: N/A;  axios placed at GJ anastamosis   DUODENAL STENT PLACEMENT  03/27/2022   Procedure: GASTRIC STENT PLACEMENT;  Surgeon: Meridee Score Netty Starring., MD;  Location: [facility] ENDOSCOPY;  Service: Gastroenterology;;   ESOPHAGEAL DILATION  05/27/2021   Procedure: ESOPHAGEAL DILATION;  Surgeon: Lemar Lofty., MD;  Location: [facility] ENDOSCOPY;  Service: Gastroenterology;;   ESOPHAGEAL DILATION  01/23/2022   Procedure: ESOPHAGEAL DILATION;  Surgeon: Lemar Lofty., MD;  Location: [facility] ENDOSCOPY;  Service: Gastroenterology;;   ESOPHAGEAL STENT PLACEMENT N/A 05/27/2021   Procedure: ESOPHAGEAL STENT PLACEMENT;  Surgeon: Lemar Lofty., MD;  Location: [facility] ENDOSCOPY;  Service: Gastroenterology;  Laterality: N/A;   ESOPHAGOGASTRODUODENOSCOPY  06/27/2005   ESOPHAGOGASTRODUODENOSCOPY N/A 03/19/2021   Procedure: ESOPHAGOGASTRODUODENOSCOPY (EGD);  Surgeon: Sheliah Hatch, De Blanch, MD;  Location: [facility] ENDOSCOPY;  Service: General;  Laterality: N/A;   ESOPHAGOGASTRODUODENOSCOPY N/A 08/16/2021   Procedure: ESOPHAGOGASTRODUODENOSCOPY (EGD);  Surgeon: Lemar Lofty., MD;  Location: [facility] ENDOSCOPY;  Service: Gastroenterology;  Laterality: N/A;   ESOPHAGOGASTRODUODENOSCOPY (EGD) WITH PROPOFOL N/A 05/27/2021   Procedure: ESOPHAGOGASTRODUODENOSCOPY (EGD) WITH PROPOFOL;  Surgeon: Meridee Score Netty Starring., MD;  Location: [facility] ENDOSCOPY;  Service: Gastroenterology;  Laterality: N/A;   ESOPHAGOGASTRODUODENOSCOPY (EGD) WITH PROPOFOL N/A 07/08/2021   Procedure: ESOPHAGOGASTRODUODENOSCOPY (EGD) WITH PROPOFOL;  Surgeon: Meridee Score Netty Starring., MD;  Location: [facility] ENDOSCOPY;  Service: Gastroenterology;  Laterality: N/A;  fluoro   ESOPHAGOGASTRODUODENOSCOPY (EGD) WITH PROPOFOL N/A  08/14/2021   Procedure: ESOPHAGOGASTRODUODENOSCOPY (EGD) WITH PROPOFOL;  Surgeon: Meridee Score Netty Starring., MD;  Location: [facility] ENDOSCOPY;  Service: Gastroenterology;  Laterality: N/A;  fluoro Axios stent (20 mm)   ESOPHAGOGASTRODUODENOSCOPY (EGD) WITH PROPOFOL N/A 10/16/2021   Procedure: ESOPHAGOGASTRODUODENOSCOPY (EGD) WITH PROPOFOL;  Surgeon: Meridee Score Netty Starring., MD;  Location: [facility] ENDOSCOPY;  Service: Gastroenterology;  Laterality: N/A;  axios stent pull fluoro   ESOPHAGOGASTRODUODENOSCOPY (EGD) WITH PROPOFOL N/A 11/18/2021   Procedure: ESOPHAGOGASTRODUODENOSCOPY (EGD) WITH PROPOFOL;  Surgeon: Meridee Score Netty Starring., MD;  Location: [facility] ENDOSCOPY;  Service: Gastroenterology;  Laterality: N/A;   ESOPHAGOGASTRODUODENOSCOPY (EGD) WITH PROPOFOL N/A 01/23/2022   Procedure: ESOPHAGOGASTRODUODENOSCOPY (EGD) WITH PROPOFOL;  Surgeon: Meridee Score Netty Starring., MD;  Location: [facility] ENDOSCOPY;  Service: Gastroenterology;  Laterality: N/A;   ESOPHAGOGASTRODUODENOSCOPY (EGD) WITH PROPOFOL N/A 02/20/2022   Procedure: ESOPHAGOGASTRODUODENOSCOPY (EGD) WITH PROPOFOL;  Surgeon: Rachael Fee, MD;  Location: [facility] ENDOSCOPY;  Service: Gastroenterology;  Laterality: N/A;   ESOPHAGOGASTRODUODENOSCOPY (EGD) WITH PROPOFOL N/A 03/27/2022   Procedure: ESOPHAGOGASTRODUODENOSCOPY (EGD) WITH PROPOFOL;  Surgeon: Meridee Score Netty Starring., MD;  Location: [facility] ENDOSCOPY;  Service: Gastroenterology;  Laterality: N/A;   FLEXIBLE SIGMOIDOSCOPY N/A 08/16/2021   Procedure: FLEXIBLE SIGMOIDOSCOPY;  Surgeon: Meridee Score Netty Starring., MD;  Location: [facility] ENDOSCOPY;  Service: Gastroenterology;  Laterality: N/A;   FOREIGN BODY REMOVAL  02/20/2022   Procedure: FOREIGN BODY REMOVAL;  Surgeon: Rachael Fee, MD;  Location: [facility] ENDOSCOPY;  Service: Gastroenterology;;   FOREIGN BODY REMOVAL ESOPHAGEAL  03/27/2022   Procedure: REMOVAL FOREIGN BODY;  Surgeon: Meridee Score Netty Starring., MD;  Location: [facility] ENDOSCOPY;  Service: Gastroenterology;;   GASTRIC ROUX-EN-Y  N/A 02/18/2021   Procedure: LAPAROSCOPIC ROUX-EN-Y GASTRIC BYPASS WITH UPPER ENDOSCOPY,;  Surgeon: Sheliah Hatch De Blanch, MD;  Location: [facility] ORS;  Service: General;  Laterality: N/A;   GASTROINTESTINAL STENT REMOVAL  01/23/2022   Procedure: Daine Gip STENT REMOVAL;  Surgeon: Lemar Lofty., MD;  Location: [facility] ENDOSCOPY;  Service: Gastroenterology;;   HEMORRHOID SURGERY  08/25/1997   IR GJ TUBE CHANGE  03/27/2021   LAPAROSCOPIC INSERTION GASTROSTOMY TUBE N/A 03/20/2021   Procedure: LAPAROSCOPIC INSERTION GASTROSTOMY TUBE;  Surgeon: Rodman Pickle, MD;  Location: [facility] ORS;  Service: General;  Laterality: N/A;   LEFT HEART CATH AND CORONARY ANGIOGRAPHY N/A 04/15/2021   Procedure: LEFT HEART CATH AND CORONARY ANGIOGRAPHY;  Surgeon: Runell Gess, MD;  Location: [facility] INVASIVE CV LAB;  Service: Cardiovascular;  Laterality: N/A;   MOUTH SURGERY     removed area which was benign   STENT REMOVAL  07/08/2021   Procedure: STENT REMOVAL;  Surgeon: Lemar Lofty., MD;  Location: [facility] ENDOSCOPY;  Service: Gastroenterology;;   Francine Graven REMOVAL  10/16/2021   Procedure: STENT REMOVAL;  Surgeon: Lemar Lofty., MD;  Location: [facility] ENDOSCOPY;  Service: Gastroenterology;;   Karna Dupes INJECTION  01/23/2022   Procedure: STEROID INJECTION;  Surgeon: Lemar Lofty., MD;  Location: [facility] ENDOSCOPY;  Service: Gastroenterology;;   Sunnie Nielsen INJECTION  11/18/2021   Procedure: SUBMUCOSAL INJECTION;  Surgeon: Lemar Lofty., MD;  Location: [facility] ENDOSCOPY;  Service: Gastroenterology;;   TONSILLECTOMY     UPPER GI ENDOSCOPY N/A 03/20/2021   Procedure: UPPER GI ENDOSCOPY;  Surgeon: Kinsinger, De Blanch, MD;  Location: [facility] ORS;  Service: General;  Laterality: N/A;   Current Facility-Administered Medications  Medication Dose Route Frequency Provider Last Rate Last Admin   lactated ringers infusion   Intravenous Continuous Mansouraty, Netty Starring., MD 10 mL/hr at 08/14/22 1241 Continued from Pre-op  at 08/14/22 1241    Current Facility-Administered Medications:    lactated ringers infusion, , Intravenous, Continuous, Mansouraty, Netty Starring., MD, Last Rate: 10 mL/hr at 08/14/22 1241, Continued from Pre-op at 08/14/22 1241 Allergies  Allergen Reactions   Other Other (See Comments)    Cats   Family History  Problem Relation Age of Onset   Hypertension Mother    Diabetes Mother    Heart attack Father 70   CAD Father    Hypertension Sister    Diabetes Other    Hypertension Other    Heart disease Other    Colon cancer Neg Hx    Esophageal cancer Neg Hx    Pancreatic cancer Neg Hx    Stomach cancer Neg Hx    Liver disease Neg Hx    Inflammatory bowel disease Neg Hx    Rectal cancer Neg Hx    Social History   Socioeconomic History   Marital status: Married    Spouse name: Not on file   Number of children: 3   Years of education: Not on file   Highest education level: Not on file  Occupational History   Occupation: IT IT trainer  Tobacco Use   Smoking status: Former    Packs/day: 1.00    Years: 15.00    Total pack years: 15.00    Types: Cigarettes    Quit date: 08/05/1989  Years since quitting: 33.0   Smokeless tobacco: Never  Vaping Use   Vaping Use: Never used  Substance and Sexual Activity   Alcohol use: Yes    Comment: occ   Drug use: Never   Sexual activity: Not on file  Other Topics Concern   Not on file  Social History Narrative   Not on file   Social Determinants of Health   Financial Resource Strain: Not on file  Food Insecurity: Not on file  Transportation Needs: Not on file  Physical Activity: Not on file  Stress: Not on file  Social Connections: Not on file  Intimate Partner Violence: Not on file    Physical Exam: Today's Vitals   08/14/22 1216 08/14/22 1313 08/14/22 1320 08/14/22 1330  BP: (!) 152/87 115/76 117/85 133/78  Pulse: 63 (!) 55 63 63  Resp: 16 16 17 15   Temp: 98.5 F (36.9 C)     TempSrc: Temporal     SpO2: 96% 100%  96% 98%  Weight: 99.8 kg     Height: 6\' 2"  (1.88 m)     PainSc: 0-No pain Asleep 0-No pain    Body mass index is 28.25 kg/m. GEN: NAD EYE: Sclerae anicteric ENT: MMM CV: Non-tachycardic GI: Soft, NT/ND NEURO:  Alert & Oriented x 3  Lab Results: No results for input(s): "WBC", "HGB", "HCT", "PLT" in the last 72 hours. BMET No results for input(s): "NA", "K", "CL", "CO2", "GLUCOSE", "BUN", "CREATININE", "CALCIUM" in the last 72 hours. LFT No results for input(s): "PROT", "ALBUMIN", "AST", "ALT", "ALKPHOS", "BILITOT", "BILIDIR", "IBILI" in the last 72 hours. PT/INR No results for input(s): "LABPROT", "INR" in the last 72 hours.   Impression / Plan: This is a 61 y.o.male  who presents for EGD for stent pull in setting of recurrent GJ anastomosis stricture and upcoming attempt at stricturotomy.  The risks and benefits of endoscopic evaluation/treatment were discussed with the patient and/or family; these include but are not limited to the risk of perforation, infection, bleeding, missed lesions, lack of diagnosis, severe illness requiring hospitalization, as well as anesthesia and sedation related illnesses.  The patient's history has been reviewed, patient examined, no change in status, and deemed stable for procedure.  The patient and/or family is agreeable to proceed.    Corliss ParishGabriel Mansouraty, MD [facility] Office # [phone]

## 2022-08-14 NOTE — Anesthesia Procedure Notes (Signed)
Procedure Name: MAC Date/Time: 08/14/2022 12:44 PM  Performed by: Deliah Boston, CRNAPre-anesthesia Checklist: Patient identified, Emergency Drugs available, Suction available and Patient being monitored Patient Re-evaluated:Patient Re-evaluated prior to induction Oxygen Delivery Method: Simple face mask Placement Confirmation: positive ETCO2 and breath sounds checked- equal and bilateral

## 2022-08-14 NOTE — Discharge Instructions (Signed)
YOU HAD AN ENDOSCOPIC PROCEDURE TODAY: Refer to the procedure report and other information in the discharge instructions given to you for any specific questions about what was found during the examination. If this information does not answer your questions, please call [place] office at [phone] to clarify.  ° °YOU SHOULD EXPECT: Some feelings of bloating in the abdomen. Passage of more gas than usual. Walking can help get rid of the air that was put into your GI tract during the procedure and reduce the bloating. If you had a lower endoscopy (such as a colonoscopy or flexible sigmoidoscopy) you may notice spotting of blood in your stool or on the toilet paper. Some abdominal soreness may be present for a day or two, also. ° °DIET: Your first meal following the procedure should be a light meal and then it is ok to progress to your normal diet. A half-sandwich or bowl of soup is an example of a good first meal. Heavy or fried foods are harder to digest and may make you feel nauseous or bloated. Drink plenty of fluids but you should avoid alcoholic beverages for 24 hours. If you had a esophageal dilation, please see attached instructions for diet.   ° °ACTIVITY: Your care partner should take you home directly after the procedure. You should plan to take it easy, moving slowly for the rest of the day. You can resume normal activity the day after the procedure however YOU SHOULD NOT DRIVE, use power tools, machinery or perform tasks that involve climbing or major physical exertion for 24 hours (because of the sedation medicines used during the test).  ° °SYMPTOMS TO REPORT IMMEDIATELY: °A gastroenterologist can be reached at any hour. Please call [phone]  for any of the following symptoms:  °Following lower endoscopy (colonoscopy, flexible sigmoidoscopy) °Excessive amounts of blood in the stool  °Significant tenderness, worsening of abdominal pains  °Swelling of the abdomen that is new, acute  °Fever of 100° or  higher  °Following upper endoscopy (EGD, EUS, ERCP, esophageal dilation) °Vomiting of blood or coffee ground material  °New, significant abdominal pain  °New, significant chest pain or pain under the shoulder blades  °Painful or persistently difficult swallowing  °New shortness of breath  °Black, tarry-looking or red, bloody stools ° °FOLLOW UP:  °If any biopsies were taken you will be contacted by phone or by letter within the next 1-3 weeks. Call [phone]  if you have not heard about the biopsies in 3 weeks.  °Please also call with any specific questions about appointments or follow up tests. ° °

## 2022-08-14 NOTE — Transfer of Care (Signed)
Immediate Anesthesia Transfer of Care Note Immediate Anesthesia Transfer of Care Note  Patient: YECHEZKEL FERTIG  Procedure(s) Performed: Procedure(s): ESOPHAGOGASTRODUODENOSCOPY (EGD) WITH PROPOFOL (N/A) STENT REMOVAL SUBMUCOSAL INJECTION  Patient Location: PACU  Anesthesia Type:MAC  Level of Consciousness: Patient easily awoken, sedated, comfortable, cooperative, following commands, responds to stimulation.   Airway & Oxygen Therapy: Patient spontaneously breathing, ventilating well, oxygen via simple oxygen mask.  Post-op Assessment: Report given to PACU RN, vital signs reviewed and stable, moving all extremities.   Post vital signs: Reviewed and stable.  Complications: No apparent anesthesia complications Last Vitals:  Vitals Value Taken Time  BP 115/76 08/14/22 1310  Temp    Pulse 58 08/14/22 1311  Resp 18 08/14/22 1311  SpO2 100 % 08/14/22 1311  Vitals shown include unvalidated device data.  Last Pain:  Vitals:   08/14/22 1216  TempSrc: Temporal  PainSc: 0-No pain         Complications: No notable events documented.

## 2022-08-14 NOTE — Anesthesia Preprocedure Evaluation (Signed)
Anesthesia Evaluation  Patient identified by MRN, date of birth, ID band Patient awake    Reviewed: Allergy & Precautions, NPO status , Patient's Chart, lab work & pertinent test results  History of Anesthesia Complications Negative for: history of anesthetic complications  Airway Mallampati: III  TM Distance: >3 FB Neck ROM: Full    Dental  (+) Teeth Intact, Dental Advisory Given   Pulmonary sleep apnea and Continuous Positive Airway Pressure Ventilation , former smoker   breath sounds clear to auscultation       Cardiovascular hypertension, + CAD, + Past MI and + Cardiac Stents  + dysrhythmias Supra Ventricular Tachycardia  Rhythm:Regular   1. Left ventricular ejection fraction, by estimation, is 50 to 55%. The  left ventricle has low normal function. The left ventricle demonstrates  regional wall motion abnormalities. Apical hypokinesis. There is moderate  asymmetric left ventricular  hypertrophy of the basal-septal segment. Left ventricular diastolic  parameters are indeterminate.   2. Right ventricular systolic function is normal. The right ventricular  size is mildly enlarged. Tricuspid regurgitation signal is inadequate for  assessing PA pressure.   3. The mitral valve is normal in structure. No evidence of mitral valve  regurgitation. No evidence of mitral stenosis.   4. The aortic valve is tricuspid. Aortic valve regurgitation is not  visualized. Mild aortic valve sclerosis is present, with no evidence of  aortic valve stenosis.   5. Aortic dilatation noted. There is dilatation of the ascending aorta,  measuring 44 mm.     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   Endo/Other  diabetes  Morbid obesity  Renal/GU negative Renal ROSLab Results      Component                Value                Date                      CREATININE               0.53 (L)            03/13/2022             negative genitourinary   Musculoskeletal  (+) Arthritis ,    Abdominal   Peds  Hematology negative hematology ROS (+) Lab Results      Component                Value               Date                      WBC                      10.1                07/13/2022                HGB                      15.2                07/13/2022                HCT  45.3                07/13/2022                MCV                      89.7                07/13/2022                PLT                      200                 07/13/2022            plavix   Anesthesia Other Findings   Reproductive/Obstetrics                             Anesthesia Physical Anesthesia Plan  ASA: 3  Anesthesia Plan: MAC   Post-op Pain Management: Minimal or no pain anticipated   Induction: Intravenous  PONV Risk Score and Plan: 1 and Propofol infusion and Treatment may vary due to age or medical condition  Airway Management Planned: Natural Airway and Nasal Cannula  Additional Equipment: None  Intra-op Plan:   Post-operative Plan:   Informed Consent: I have reviewed the patients History and Physical, chart, labs and discussed the procedure including the risks, benefits and alternatives for the proposed anesthesia with the patient or authorized representative who has indicated his/her understanding and acceptance.     Dental advisory given  Plan Discussed with: CRNA  Anesthesia Plan Comments: (Patient still symptomatic from his obstruction but has been on liquids only for several weeks. He has had multiple deep sedation anesthetics recently without issues. Discussed with patient the increased risk of aspiration with sedation because of his obstruction, but overall with his diet I believe the risk to be low. Will proceed with sedation, patient agrees. )         Anesthesia Quick Evaluation

## 2022-08-15 NOTE — Anesthesia Postprocedure Evaluation (Signed)
Anesthesia Post Note  Patient: Hector Curry  Procedure(s) Performed: ESOPHAGOGASTRODUODENOSCOPY (EGD) WITH PROPOFOL STENT REMOVAL SUBMUCOSAL INJECTION     Patient location during evaluation: Endoscopy Anesthesia Type: MAC Level of consciousness: awake and alert Pain management: pain level controlled Vital Signs Assessment: post-procedure vital signs reviewed and stable Respiratory status: spontaneous breathing, nonlabored ventilation and respiratory function stable Cardiovascular status: stable and blood pressure returned to baseline Postop Assessment: no apparent nausea or vomiting Anesthetic complications: no  No notable events documented.  Last Vitals:  Vitals:   08/14/22 1320 08/14/22 1330  BP: 117/85 133/78  Pulse: 63 63  Resp: 17 15  Temp:    SpO2: 96% 98%    Last Pain:  Vitals:   08/14/22 1330  TempSrc:   PainSc: 0-No pain                 [name]

## 2022-08-17 IMAGING — DX DG ABDOMEN 2V
3 series · 3 of 3 positions shown · non-contrast
Comparison: CT abdomen 04/14/2021

CLINICAL DATA: Gastric stent. Recent feeding tube and esophageal
stent placement after gastric bypass.

EXAM:
ABDOMEN - 2 VIEW

[abdomen erect]
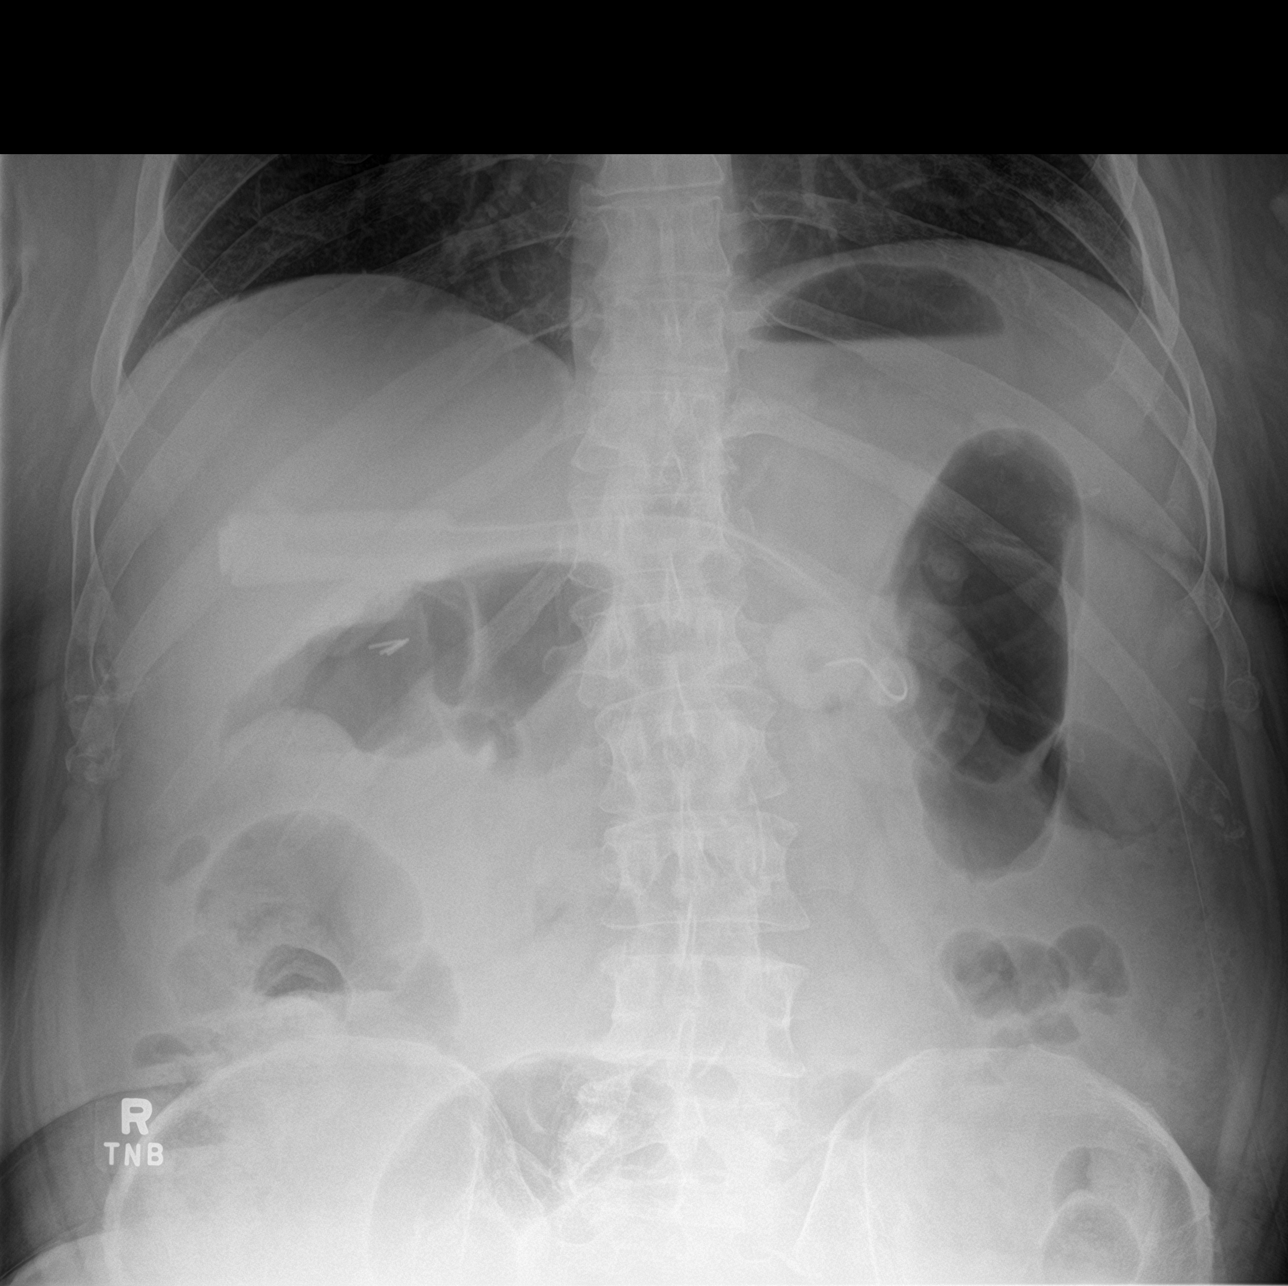

[abdomen supine (1 of 2)]
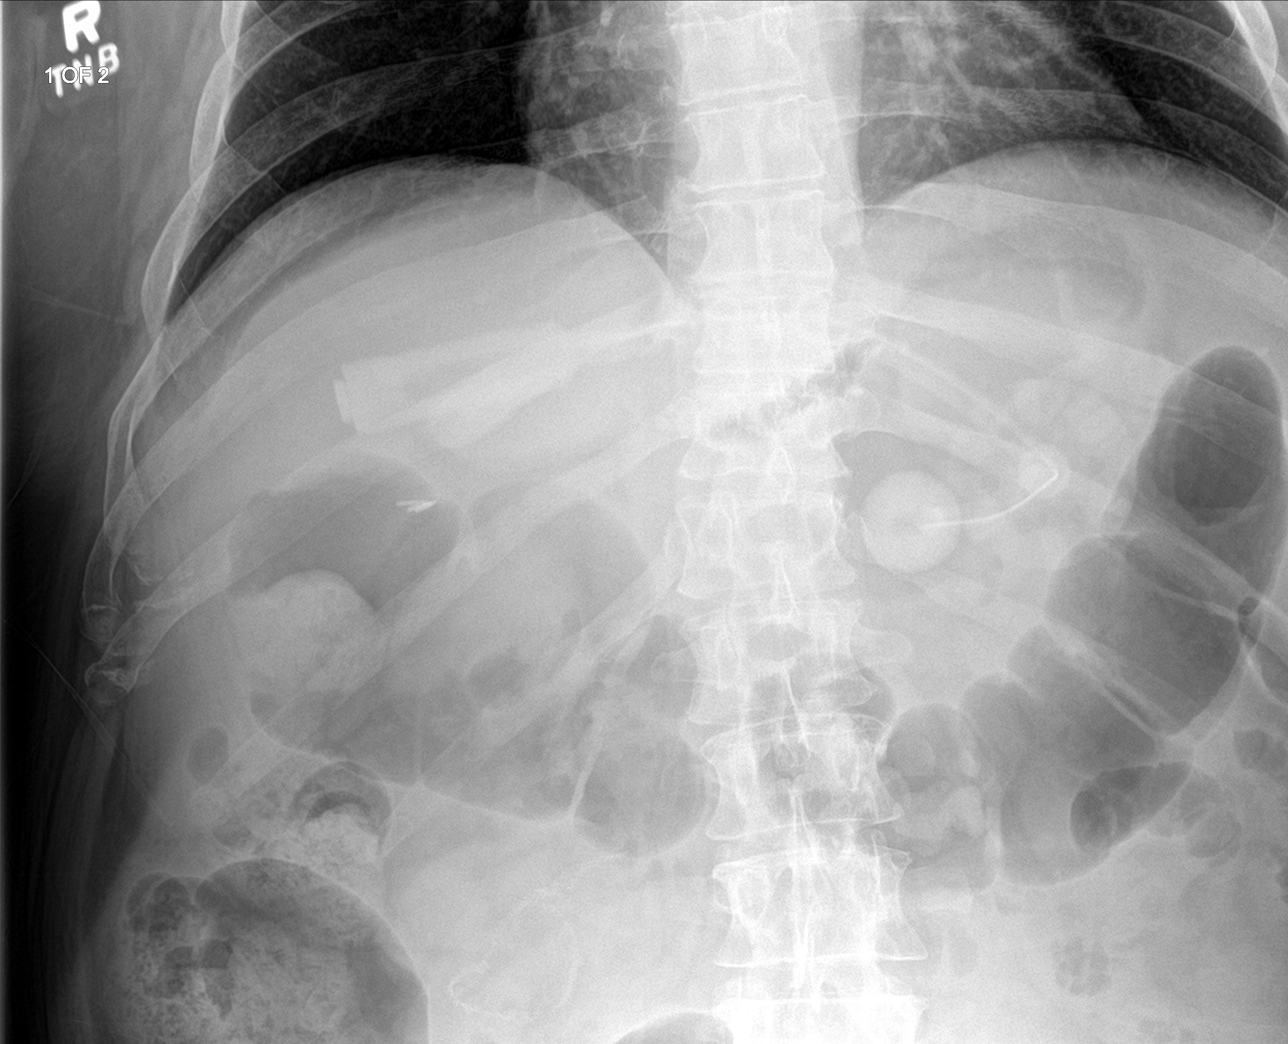

[abdomen supine (2 of 2)]
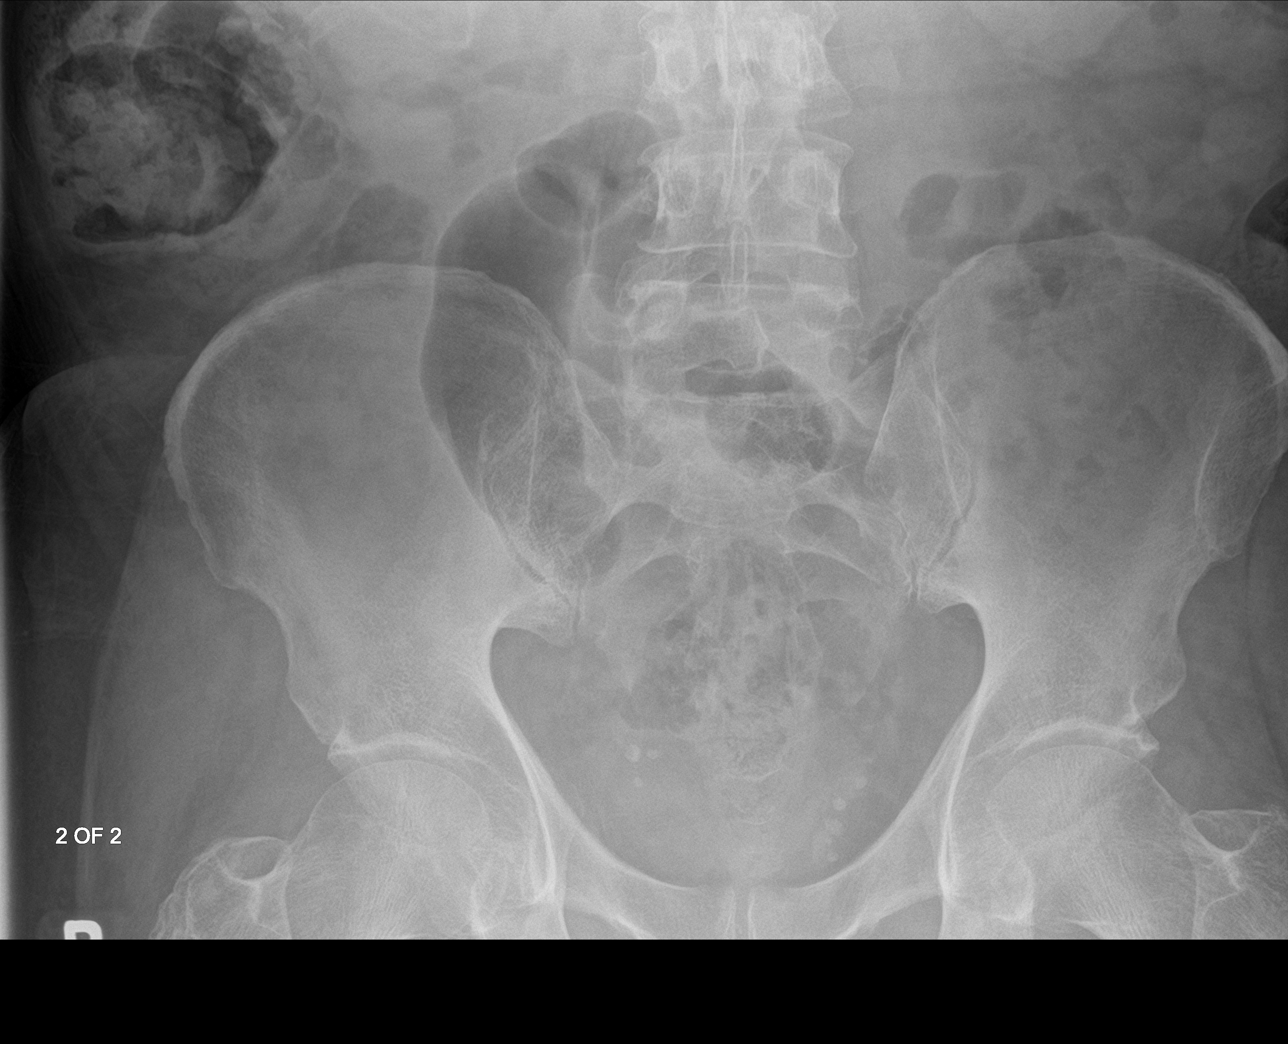

[3 of 3 positions shown; findings below may reference images not displayed]

FINDINGS: No dilated loops of bowel to indicate ileus or obstruction. G-tube
again seen in the left upper quadrant. Right upper quadrant surgical
clips likely related to prior cholecystectomy. No suspicious
abdominal calcifications. Surgical sutures seen within the abdomen
consistent with prior gastric bypass. No esophageal stent is
identified.
IMPRESSION: 1. Nonobstructive bowel gas pattern.
2. No soft esophageal stent identified.
3. Left upper quadrant gastrostomy tube again seen.

## 2022-08-17 IMAGING — DX DG CHEST 2V
2 series · 2 of 2 positions shown · non-contrast
Comparison: Chest x-ray 04/14/2021.

CLINICAL DATA: 59-year-old male with history of recent esophageal
stent placement.

EXAM:
CHEST - 2 VIEW

[chest pa]
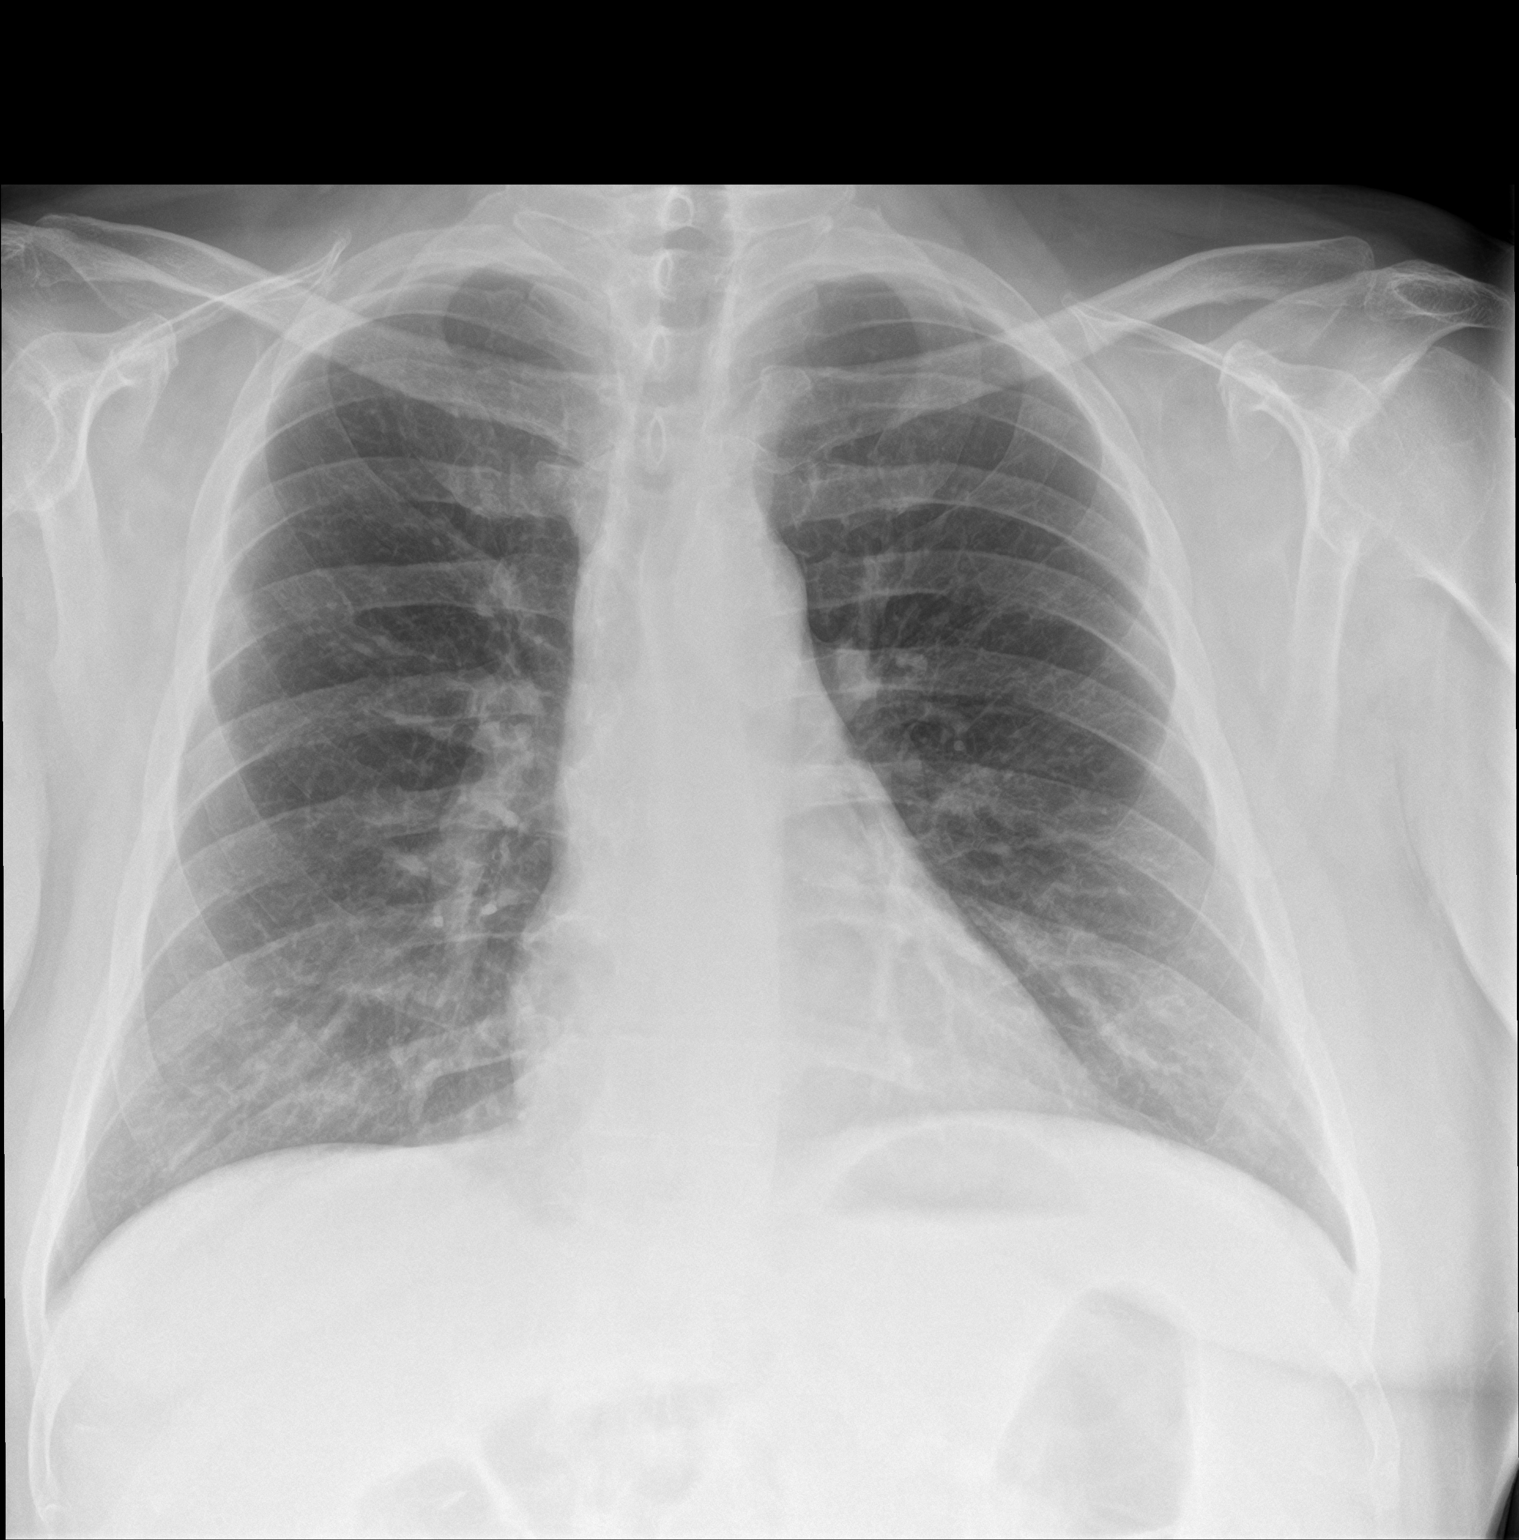

[chest lat]
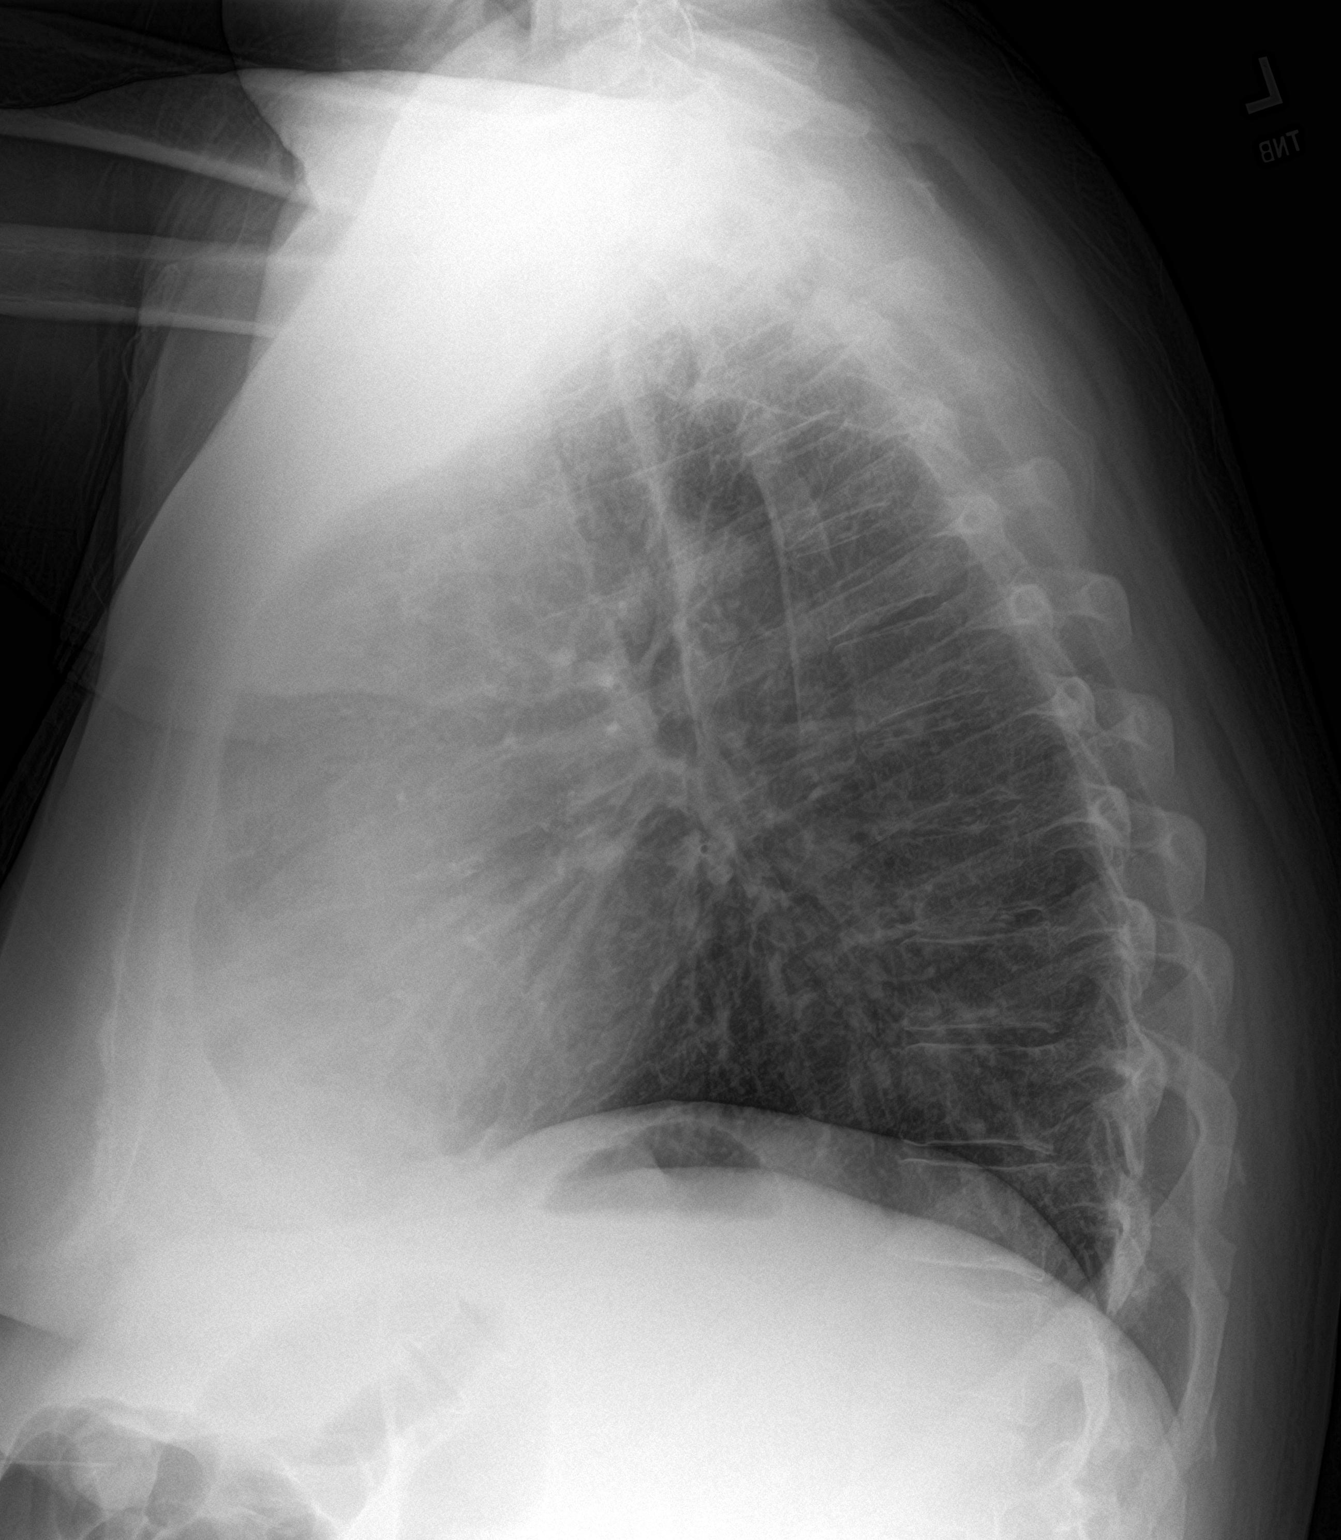

[2 of 2 positions shown; findings below may reference images not displayed]

FINDINGS: Lung volumes are normal. No consolidative airspace disease. No
pleural effusions. No pneumothorax. No pulmonary nodule or mass
noted. Pulmonary vasculature and the cardiomediastinal silhouette
are within normal limits.
IMPRESSION: 1.  No radiographic evidence of acute cardiopulmonary disease.
2. No esophageal stent confidently identified.

## 2022-08-18 ENCOUNTER — Encounter (HOSPITAL_COMMUNITY): Payer: Self-pay | Admitting: Gastroenterology

## 2022-08-26 DIAGNOSIS — K9189 Other postprocedural complications and disorders of digestive system: Secondary | ICD-10-CM | POA: Diagnosis not present

## 2022-08-26 DIAGNOSIS — K56699 Other intestinal obstruction unspecified as to partial versus complete obstruction: Secondary | ICD-10-CM | POA: Diagnosis not present

## 2022-08-27 ENCOUNTER — Other Ambulatory Visit (HOSPITAL_COMMUNITY): Payer: Self-pay

## 2022-08-27 ENCOUNTER — Encounter (HOSPITAL_COMMUNITY): Payer: Self-pay | Admitting: Hematology

## 2022-09-01 ENCOUNTER — Other Ambulatory Visit: Payer: Self-pay | Admitting: Cardiovascular Disease

## 2022-09-01 ENCOUNTER — Other Ambulatory Visit: Payer: Self-pay

## 2022-09-01 ENCOUNTER — Other Ambulatory Visit (HOSPITAL_COMMUNITY): Payer: Self-pay

## 2022-09-01 MED ORDER — ATORVASTATIN CALCIUM 10 MG PO TABS
10.0000 mg | ORAL_TABLET | Freq: Every day | ORAL | 0 refills | Status: DC
  Filled 2022-09-01: qty 90, 90d supply, fill #0

## 2022-09-08 ENCOUNTER — Other Ambulatory Visit: Payer: Self-pay

## 2022-09-08 ENCOUNTER — Other Ambulatory Visit (HOSPITAL_COMMUNITY): Payer: Self-pay

## 2022-09-08 ENCOUNTER — Other Ambulatory Visit: Payer: Self-pay | Admitting: Cardiovascular Disease

## 2022-09-08 MED ORDER — PROPRANOLOL HCL 10 MG PO TABS
ORAL_TABLET | ORAL | 0 refills | Status: DC
  Filled 2022-09-08: qty 90, 15d supply, fill #0

## 2022-09-09 ENCOUNTER — Other Ambulatory Visit (HOSPITAL_COMMUNITY): Payer: Self-pay

## 2022-09-10 ENCOUNTER — Other Ambulatory Visit (HOSPITAL_COMMUNITY): Payer: Self-pay

## 2022-09-22 ENCOUNTER — Other Ambulatory Visit (HOSPITAL_COMMUNITY): Payer: Self-pay

## 2022-09-22 ENCOUNTER — Other Ambulatory Visit: Payer: Self-pay

## 2022-09-22 ENCOUNTER — Other Ambulatory Visit: Payer: Self-pay | Admitting: Cardiovascular Disease

## 2022-09-22 ENCOUNTER — Other Ambulatory Visit: Payer: Self-pay | Admitting: Gastroenterology

## 2022-09-22 ENCOUNTER — Telehealth: Payer: Self-pay | Admitting: Cardiovascular Disease

## 2022-09-22 MED ORDER — PROPRANOLOL HCL 10 MG PO TABS
10.0000 mg | ORAL_TABLET | Freq: Three times a day (TID) | ORAL | 3 refills | Status: DC | PRN
  Filled 2022-09-22: qty 90, 15d supply, fill #0

## 2022-09-22 MED ORDER — CLOPIDOGREL BISULFATE 75 MG PO TABS: 75.0000 mg | ORAL_TABLET | Freq: Every day | ORAL | 0 refills | Status: DC

## 2022-09-22 NOTE — Telephone Encounter (Signed)
Pt has changed cardiologists to [place]. Declined to schedule.

## 2022-09-22 NOTE — Telephone Encounter (Signed)
Please schedule 12 month f/u with Dr. Rockey Situ, last seen 09-23-21. Medication refills pending appt. Thank you.

## 2022-09-22 NOTE — Telephone Encounter (Signed)
Dr Rush Landmark please advise if we are suppose to be refilling patients plavix?

## 2022-09-22 NOTE — Telephone Encounter (Signed)
*  STAT* If patient is at the pharmacy, call can be transferred to refill team.   1. Which medications need to be refilled? (please list name of each medication and dose if known)   propranolol (INDERAL) 10 MG tablet   2. Which pharmacy/location (including street and city if local pharmacy) is medication to be sent to?  [place]   3. Do they need a 30 day or 90 day supply? 30 day  Patient stated he has 2 days left of this medication.

## 2022-09-22 NOTE — Telephone Encounter (Addendum)
[place], Plavix should be refilled by Cardiology or PCP. Thanks. GM

## 2022-09-22 NOTE — Telephone Encounter (Signed)
Disp Refills Start End   propranolol (INDERAL) 10 MG tablet 90 tablet 3 09/22/2022    Sig: Take 1 to 2 tablets by mouth 3 times daily as needed for tachycardia.   Sent to pharmacy as: propranolol (INDERAL) 10 MG tablet    [place]

## 2022-09-23 ENCOUNTER — Other Ambulatory Visit: Payer: Self-pay

## 2022-09-23 ENCOUNTER — Encounter: Payer: Self-pay | Admitting: Gastroenterology

## 2022-09-23 ENCOUNTER — Other Ambulatory Visit (HOSPITAL_COMMUNITY): Payer: Self-pay

## 2022-09-23 DIAGNOSIS — K9189 Other postprocedural complications and disorders of digestive system: Secondary | ICD-10-CM

## 2022-09-26 ENCOUNTER — Encounter (HOSPITAL_COMMUNITY): Payer: Self-pay | Admitting: Hematology

## 2022-09-26 ENCOUNTER — Other Ambulatory Visit: Payer: Self-pay

## 2022-09-26 ENCOUNTER — Ambulatory Visit: Payer: 59 | Attending: Internal Medicine | Admitting: Internal Medicine

## 2022-09-26 ENCOUNTER — Ambulatory Visit: Payer: Self-pay | Admitting: Internal Medicine

## 2022-09-26 ENCOUNTER — Telehealth: Payer: Self-pay

## 2022-09-26 ENCOUNTER — Encounter: Payer: Self-pay | Admitting: Internal Medicine

## 2022-09-26 VITALS — BP 114/70 | HR 61 | Ht 74.0 in | Wt 225.0 lb

## 2022-09-26 DIAGNOSIS — I77810 Thoracic aortic ectasia: Secondary | ICD-10-CM

## 2022-09-26 DIAGNOSIS — I4719 Other supraventricular tachycardia: Secondary | ICD-10-CM | POA: Diagnosis not present

## 2022-09-26 MED ORDER — PROPRANOLOL HCL 20 MG PO TABS
20.0000 mg | ORAL_TABLET | Freq: Three times a day (TID) | ORAL | 3 refills | Status: DC
  Filled 2022-09-26: qty 270, 90d supply, fill #0
  Filled 2022-12-09: qty 270, 90d supply, fill #1

## 2022-09-26 NOTE — Progress Notes (Addendum)
Cardiology Office Note  Date: 09/26/2022   ID: Hector Curry, DOB 02/26/1961, MRN [id number]  PCP:  [name], MD  Cardiologist:  Jenkins Rouge, MD Electrophysiologist:  None   Reason for Office Visit: CAD follow-up   History of Present Illness: Hector Curry is a 62 y.o. male known to have CAD s/p LAD PCI in 2022 with residual diagonal 90% stenosis on Plavix monotherapy and normal LVEF, atrial tachycardia, DM2, HLD, gastric bypass surgery history complicated by stricturing of gastrojejunal anastomosis (s/p AX IOS cold stenting x 4), OSA on BiPAP presented to cardiology clinic for follow-up visit.  Patient has no interval hospitalizations/ER visits for chest pain. He has dysphagia to solid diet and currently can take on a liquid diet due to presence of stricture in the gastrojejunal segment. He is scheduled for EGD on 10/16/2022.  He reported having palpitations at nighttime for which he was taking propranolol 10 to 20 mg 3 times as needed which was adequately controlling his palpitations but kept running and out of prescription.  Otherwise, he takes metoprolol tartrate 50 mg in the morning but does not take any metoprolol in the nighttime. He also states medical does not quite control his palpitations but propranolol significant proved his symptoms.  He denies any angina, DOE, lightheadedness, syncope, leg swelling. He is overall doing great, exercises, goes to the gym and lifts weights.  Past Medical History:  Diagnosis Date   Allergic rhinitis    Atrial tachycardia    CAD (coronary artery disease)    a. 05/08/2015 Cath: no significant CAD, LVEF nl-->Med; b. 07/2017 MV: attenuation artifact, no ischemia, EF 65%-->Low risk; c. 03/2021 NSTEMI/PCI: LM nl, LAD 80p (3.0x18 Onyx Frontier DES), D1 90 (too small for PCI), LCX nl, RCA nl.   Diabetes mellitus without complication (HCC)    Diastolic dysfunction    a. 04/2015 Echo: EF 60-65%; b. 05/2016 Echo: EF 60-65%, GrI DD; c.  07/2017 Echo: EF 55-60%, Ao root 106mm; d. 03/2019 Echo: EF 55-60%, Ao root/Asc Ao 44mm; e. 03/2021 Echo: EF 50-55%, apical HK, mod asymm basal-septal hypertrophy. Nl RV fxn. Asc Ao 20mm.   Dilated aortic root ([place])    a. 07/2017 Echo: 25mm Ao root - mildly dil; b. 03/2019 Echo: Ao root 16mm; c. 03/2021 Echo: Asc Ao 30mm.   Diverticulosis 10/03/2014   Dysrhythmia    Esophageal stricture    a.  In setting of lap band June 2022-stricture at the [place] anastomosis requiring gastrostomy tube; b. 05/2021 s/p esoph stenting (77mm); c. 07/2021 s/p esoph stenting (43mm).   Family history of premature CAD    a. father passed from MI at 46   GERD (gastroesophageal reflux disease)    GIB (gastrointestinal bleeding)    a. 07/2021 following esoph stenting.   Hemorrhoids    a. internal hemorrhoids s/p surgery 1999   History of kidney stones    History of tobacco abuse    Hyperlipidemia    Hyperplastic colon polyp 10/03/2014   a. x 2    Hypertension    Inflammatory arthritis    a. CCP antibodies & x-rays negative. Rheumatoid factor 14, felt to be crystaline over RA or psoriatic   Iron deficiency anemia 02/05/2022   Morbid obesity ([place])    a. s/p LAP-BAND - complicated by esoph stricture.   OSA (obstructive sleep apnea)    a. on CPAP   Osteoarthritis    PSVT (paroxysmal supraventricular tachycardia)    a. 48 hr Holter 04/2015: NSR w/  rare PVC, short runs of narrow complex tachycardiac, possible atrial tach, longest run 7 beats, PACs noted (2% of all beats 3600 total) they did not seem to correlate w/ significant arrythmia; b. 08/2017 Event monitor: no significant arrhythmias.    Past Surgical History:  Procedure Laterality Date   BALLOON DILATION N/A 08/14/2021   Procedure: BALLOON DILATION;  Surgeon: Mansouraty, Telford Nab., MD;  Location: [facility];  Service: Gastroenterology;  Laterality: N/A;   BALLOON DILATION N/A 02/20/2022   Procedure: BALLOON DILATION;  Surgeon: Milus Banister, MD;  Location: [facility];  Service: Gastroenterology;  Laterality: N/A;   BALLOON DILATION N/A 03/27/2022   Procedure: BALLOON DILATION;  Surgeon: Rush Landmark Telford Nab., MD;  Location: [place];  Service: Gastroenterology;  Laterality: N/A;   BARIATRIC SURGERY  01/2021   lap band    BILIARY STENT PLACEMENT N/A 08/14/2021   Procedure: AXIOS STENT PLACEMENT;  Surgeon: Irving Copas., MD;  Location: [facility];  Service: Gastroenterology;  Laterality: N/A;   BIOPSY  07/08/2021   Procedure: BIOPSY;  Surgeon: Rush Landmark Telford Nab., MD;  Location: [facility];  Service: Gastroenterology;;   BIOPSY  08/14/2021   Procedure: BIOPSY;  Surgeon: Irving Copas., MD;  Location: [facility];  Service: Gastroenterology;;   CARDIAC CATHETERIZATION N/A 05/08/2015   Procedure: Left Heart Cath and Coronary Angiography;  Surgeon: Jettie Booze, MD;  Location: [place] [facility];  Service: Cardiovascular;  Laterality: N/A;   CARPAL TUNNEL RELEASE Left    CHOLECYSTECTOMY     COLONOSCOPY  06/27/2005   COLONOSCOPY  10/03/2014   CORONARY STENT INTERVENTION N/A 04/15/2021   Procedure: CORONARY STENT INTERVENTION;  Surgeon: Lorretta Harp, MD;  Location: [place] [facility];  Service: Cardiovascular;  Laterality: N/A;   DUODENAL STENT PLACEMENT N/A 11/18/2021   Procedure: DUODENAL STENT PLACEMENT;  Surgeon: Rush Landmark Telford Nab., MD;  [facility];  Service: Gastroenterology;  Laterality: N/A;  axios placed at [place] anastamosis   DUODENAL STENT PLACEMENT  03/27/2022   Procedure: GASTRIC STENT PLACEMENT;  Surgeon: Rush Landmark Telford Nab., MD;  Location: [place];  Service: Gastroenterology;;   ESOPHAGEAL DILATION  05/27/2021   Procedure: ESOPHAGEAL DILATION;  Surgeon: Irving Copas., MD;  Location: [facility];  Service: Gastroenterology;;   ESOPHAGEAL DILATION  01/23/2022   Procedure: ESOPHAGEAL DILATION;  Surgeon: Irving Copas., MD;  Location: [place];  Service:  Gastroenterology;;   ESOPHAGEAL STENT PLACEMENT N/A 05/27/2021   Procedure: ESOPHAGEAL STENT PLACEMENT;  Surgeon: Irving Copas., MD;  Location: [facility];  Service: Gastroenterology;  Laterality: N/A;   ESOPHAGOGASTRODUODENOSCOPY  06/27/2005   ESOPHAGOGASTRODUODENOSCOPY N/A 03/19/2021   Procedure: ESOPHAGOGASTRODUODENOSCOPY (EGD);  Surgeon: Kieth Brightly, Arta Bruce, MD;  Location: [facility];  Service: General;  Laterality: N/A;   ESOPHAGOGASTRODUODENOSCOPY N/A 08/16/2021   Procedure: ESOPHAGOGASTRODUODENOSCOPY (EGD);  Surgeon: Irving Copas., MD;  Location: [place];  Service: Gastroenterology;  Laterality: N/A;   ESOPHAGOGASTRODUODENOSCOPY (EGD) WITH PROPOFOL N/A 05/27/2021   Procedure: ESOPHAGOGASTRODUODENOSCOPY (EGD) WITH PROPOFOL;  Surgeon: Rush Landmark Telford Nab., MD;  [facility];  Service: Gastroenterology;  Laterality: N/A;   ESOPHAGOGASTRODUODENOSCOPY (EGD) WITH PROPOFOL N/A 07/08/2021   Procedure: ESOPHAGOGASTRODUODENOSCOPY (EGD) WITH PROPOFOL;  Surgeon: Rush Landmark Telford Nab., MD;  [facility];  Service: Gastroenterology;  Laterality: N/A;  fluoro   ESOPHAGOGASTRODUODENOSCOPY (EGD) WITH PROPOFOL N/A 08/14/2021   Procedure: ESOPHAGOGASTRODUODENOSCOPY (EGD) WITH PROPOFOL;  Surgeon: Rush Landmark Telford Nab., MD;  [facility];  Service: Gastroenterology;  Laterality: N/A;  fluoro Axios stent (20 mm)  ESOPHAGOGASTRODUODENOSCOPY (EGD) WITH PROPOFOL N/A 10/16/2021   Procedure: ESOPHAGOGASTRODUODENOSCOPY (EGD) WITH PROPOFOL;  Surgeon: Rush Landmark Telford Nab., MD;  Location: [facility];  Service: Gastroenterology;  Laterality: N/A;  axios stent pull fluoro   ESOPHAGOGASTRODUODENOSCOPY (EGD) WITH PROPOFOL N/A 11/18/2021   Procedure: ESOPHAGOGASTRODUODENOSCOPY (EGD) WITH PROPOFOL;  Surgeon: Rush Landmark Telford Nab., MD;  [facility];  Service: Gastroenterology;  Laterality: N/A;   ESOPHAGOGASTRODUODENOSCOPY (EGD) WITH PROPOFOL  N/A 01/23/2022   Procedure: ESOPHAGOGASTRODUODENOSCOPY (EGD) WITH PROPOFOL;  Surgeon: Rush Landmark Telford Nab., MD;  Location: [place];  Service: Gastroenterology;  Laterality: N/A;   ESOPHAGOGASTRODUODENOSCOPY (EGD) WITH PROPOFOL N/A 02/20/2022   Procedure: ESOPHAGOGASTRODUODENOSCOPY (EGD) WITH PROPOFOL;  Surgeon: Milus Banister, MD;  [facility];  Service: Gastroenterology;  Laterality: N/A;   ESOPHAGOGASTRODUODENOSCOPY (EGD) WITH PROPOFOL N/A 03/27/2022   Procedure: ESOPHAGOGASTRODUODENOSCOPY (EGD) WITH PROPOFOL;  Surgeon: Rush Landmark Telford Nab., MD;  Location: [place];  Service: Gastroenterology;  Laterality: N/A;   ESOPHAGOGASTRODUODENOSCOPY (EGD) WITH PROPOFOL N/A 08/14/2022   Procedure: ESOPHAGOGASTRODUODENOSCOPY (EGD) WITH PROPOFOL;  Surgeon: Rush Landmark Telford Nab., MD;  [facility];  Service: Gastroenterology;  Laterality: N/A;   FLEXIBLE SIGMOIDOSCOPY N/A 08/16/2021   Procedure: FLEXIBLE SIGMOIDOSCOPY;  Surgeon: Rush Landmark Telford Nab., MD;  Location: [place];  Service: Gastroenterology;  Laterality: N/A;   FOREIGN BODY REMOVAL  02/20/2022   Procedure: FOREIGN BODY REMOVAL;  Surgeon: Milus Banister, MD;  [facility];  Service: Gastroenterology;;   FOREIGN BODY REMOVAL ESOPHAGEAL  03/27/2022   Procedure: REMOVAL FOREIGN BODY;  Surgeon: Rush Landmark Telford Nab., MD;  Location: [place];  Service: Gastroenterology;;   GASTRIC ROUX-EN-Y N/A 02/18/2021   Procedure: LAPAROSCOPIC ROUX-EN-Y GASTRIC BYPASS WITH UPPER ENDOSCOPY,;  Surgeon: Kieth Brightly Arta Bruce, MD;  Location[facility];  Service: General;  Laterality: N/A;   GASTROINTESTINAL STENT REMOVAL  01/23/2022   Procedure: Theora Master STENT REMOVAL;  Surgeon: Irving Copas., MD;  Location: [place];  Service: Gastroenterology;;   HEMORRHOID SURGERY  08/25/1997   IR [place] TUBE CHANGE  03/27/2021   LAPAROSCOPIC INSERTION GASTROSTOMY TUBE N/A 03/20/2021   Procedure: LAPAROSCOPIC INSERTION  GASTROSTOMY TUBE;  Surgeon: Mickeal Skinner, MD;  [facility];  Service: General;  Laterality: N/A;   LEFT HEART CATH AND CORONARY ANGIOGRAPHY N/A 04/15/2021   Procedure: LEFT HEART CATH AND CORONARY ANGIOGRAPHY;  Surgeon: Lorretta Harp, MD;  Location: [place] [facility];  Service: Cardiovascular;  Laterality: N/A;   MOUTH SURGERY     removed area which was benign   STENT REMOVAL  07/08/2021   Procedure: STENT REMOVAL;  Surgeon: Irving Copas., MD;  Location: [facility];  Service: Gastroenterology;;   Lavell Islam REMOVAL  10/16/2021   Procedure: STENT REMOVAL;  Surgeon: Irving Copas., MD;  Location: [facility];  Service: Gastroenterology;;   Lavell Islam REMOVAL  08/14/2022   Procedure: STENT REMOVAL;  Surgeon: Irving Copas., MD;  Location: [facility];  Service: Gastroenterology;;   STERIOD INJECTION  01/23/2022   Procedure: STEROID INJECTION;  Surgeon: Irving Copas., MD;  Location: [place];  Service: Gastroenterology;;   SUBMUCOSAL INJECTION  11/18/2021   Procedure: SUBMUCOSAL INJECTION;  Surgeon: Irving Copas., MD;  Location: [facility];  Service: Gastroenterology;;   SUBMUCOSAL INJECTION  08/14/2022   Procedure: SUBMUCOSAL INJECTION;  Surgeon: Irving Copas., MD;  Location: [facility];  Service: Gastroenterology;;   TONSILLECTOMY     UPPER GI ENDOSCOPY N/A 03/20/2021   Procedure: UPPER GI ENDOSCOPY;  Surgeon: Kieth Brightly Arta Bruce, MD;  [facility];  Service: General;  Laterality: N/A;    Current Outpatient Medications  Medication Sig Dispense Refill   ascorbic acid (VITAMIN C) 500 MG tablet Take 1,000 mg by mouth daily.     atorvastatin (LIPITOR) 10 MG tablet Take 1 tablet (10 mg total) by mouth daily. 90 tablet 0   b complex vitamins capsule Take by mouth daily.     Calcium Citrate-Vitamin D (BL CALCIUM CITRATE + D PO) Take 500 mg by mouth. With 500 IU of vitamin D     clopidogrel (PLAVIX) 75 MG tablet Take  1 tablet (75 mg total) by mouth daily. 90 tablet 0   Continuous Blood Gluc Sensor (FREESTYLE LIBRE 2 SENSOR) MISC Use as directed every 14 days 10 each 1   fluticasone (FLONASE) 50 MCG/ACT nasal spray Place 1 spray into both nostrils daily as needed for allergies or rhinitis.     mometasone (ELOCON) 0.1 % lotion Apply to affected areas once daily 60 mL 5   Multiple Vitamin (MULTI-VITAMIN DAILY PO) Take by mouth. Bariartic     nitroGLYCERIN (NITROSTAT) 0.4 MG SL tablet Place 1 tablet (0.4 mg total) under the tongue every 5 (five) minutes as needed for chest pain. 25 tablet 3   pantoprazole (PROTONIX) 40 MG tablet Take 1 tablet (40 mg total) by mouth 2 (two) times daily before a meal. 60 tablet 12   PRESCRIPTION MEDICATION at bedtime. Bipap     Probiotic Product (PROBIOTIC ADVANCED PO) Take 1 capsule by mouth. And PREBIOTIC - bariatric     propranolol (INDERAL) 20 MG tablet Take 1 tablet (20 mg total) by mouth every 8 (eight) hours. 270 tablet 3   Propylene Glycol (SYSTANE BALANCE OP) Place 1 drop into both eyes daily as needed (dry eyes).     SODIUM FLUORIDE 5000 SENSITIVE 1.1-5 % GEL Place 1 application onto teeth daily.     aspirin EC 81 MG tablet Take 1 tablet (81 mg total) by mouth daily. Use while holding Plavix. (Patient not taking: Reported on 09/26/2022) 150 tablet 2   ferrous sulfate 325 (65 FE) MG tablet Take 1 tablet by mouth 2 times daily with a meal. (Patient not taking: Reported on 09/26/2022) 600 tablet 0   Potassium Chloride ER 20 MEQ TBCR Take 1 tablet by mouth 2 times daily (Patient not taking: Reported on 09/26/2022) 60 tablet 5   sucralfate (CARAFATE) 1 g tablet Take 1 tablet (1 g total) by mouth 4 (four) times daily. (Patient not taking: Reported on 09/26/2022) 120 tablet 1   No current facility-administered medications for this visit.   Allergies:  Other   Social History: The patient  reports that he quit smoking about 33 years ago. His smoking use included cigarettes. He has a  15.00 pack-year smoking history. He has never used smokeless tobacco. He reports current alcohol use. He reports that he does not use drugs.   Family History: The patient's family history includes CAD in his father; Diabetes in his mother and another family member; Heart attack (age of onset: 23) in his father; Heart disease in an other family member; Hypertension in his mother, sister, and another family member.   ROS:  Please see the history of present illness. Otherwise, complete review of systems is positive for none.  All other systems are reviewed and negative.   Physical Exam: VS:  BP 114/70   Pulse 61   Ht 6\' 2"  (1.88 m)   Wt 225 lb (102.1 kg)   SpO2 97%   BMI 28.89 kg/m ,  BMI Body mass index is 28.89 kg/m.  Wt Readings from Last 3 Encounters:  09/26/22 225 lb (102.1 kg)  08/14/22 220 lb (99.8 kg)  07/11/22 227 lb 4.7 oz (103.1 kg)    General: Patient appears comfortable at rest. HEENT: Conjunctiva and lids normal, oropharynx clear with moist mucosa. Neck: Supple, no elevated JVP or carotid bruits, no thyromegaly. Lungs: Clear to auscultation, nonlabored breathing at rest. Cardiac: Regular rate and rhythm, no S3 or significant systolic murmur, no pericardial rub. Abdomen: Soft, nontender, no hepatomegaly, bowel sounds present, no guarding or rebound. Extremities: No pitting edema, distal pulses 2+. Skin: Warm and dry. Musculoskeletal: No kyphosis. Neuropsychiatric: Alert and oriented x3, affect grossly appropriate.  ECG:  An ECG dated 09/26/2022 was personally reviewed today and demonstrated:  NSR and RBBB  Recent Labwork: 03/13/2022: ALT 88; AST 49; BUN 6; Creatinine, Ser 0.53; Potassium 4.5; Sodium 139 07/13/2022: Hemoglobin 15.2; Platelets 200     Component Value Date/Time   CHOL 92 03/13/2022 0803   TRIG 77 03/13/2022 0803   HDL 28 (L) 03/13/2022 0803   CHOLHDL 3.3 03/13/2022 0803   VLDL 15 03/13/2022 0803   LDLCALC 49 03/13/2022 0803    Other Studies  Reviewed Today:   Assessment and Plan: Patient is a 62 year old M known to have CAD s/p LAD PCI in 2022 with residual diagonal 90% stenosis on Plavix monotherapy and normal LVEF, atrial tachycardia, DM2, HLD, gastric bypass surgery history complicated by stricturing of gastrojejunal anastomosis (s/p AX IOS cold stenting x 4), OSA on BiPAP presented to cardiology clinic for follow-up visit.  # CAD s/p LAD PCI in 2022 with residual diagonal 90% stenosis on Plavix monotherapy and normal LVEF, currently angina free -Continue Plavix 75 mg once daily -Continue atorvastatin 10 mg nightly -ER precautions for chest pain  # HLD, unknown values -Continue atorvastatin 10 mg nightly.  Goal LDL less than 70.  Patient has upcoming lipid panel checked with his PCP.  # gastric bypass surgery history complicated by stricturing of gastrojejunal anastomosis (s/p AX IOS cold stenting x 4) Addendum for pre-op clearance -Patient denied any angina or DOE. EKG showed NSR and RBBB. Echo from 2022 showed normal LVEF 50-55%, moderate LVH and mild dilatation of the ascending aorta 44 mm. He is at an intermediate risk for any peri-operative cardiac complications based on his CAD Hx. Patient can hold Plavix 5-7 days prior to the procedure. In the meantime, can start taking aspirin 81 mg once daily and does not have to be held for the procedure. After the procedure, he can stop aspirin and resume taking Plavix 75 mg once daily whenever safe from a bleeding standpoint.  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 Stop metoprolol and start propranolol 20 mg every 8 hours. Will need to go up on the dose based on his symptoms.  I have spent a total of 33 minutes with patient reviewing chart , telemetry, EKGs, labs and examining patient as well as  establishing an assessment and plan that was discussed with the patient.  > 50% of time was spent in direct patient care.     Medication Adjustments/Labs and Tests Ordered: Current medicines are reviewed at length with the patient today.  Concerns regarding medicines are outlined above.   Tests Ordered: Orders Placed This Encounter  Procedures   EKG 12-Lead  Medication Changes: Meds ordered this encounter  Medications   propranolol (INDERAL) 20 MG tablet    Sig: Take 1 tablet (20 mg total) by mouth every 8 (eight) hours.    Dispense:  270 tablet    Refill:  3    Disposition:  Follow up  6 months  Signed, Rhemi Balbach Fidel Levy, MD, 09/26/2022 10:02 AM    [facility] [place] [address] [place], [place] [address]

## 2022-09-26 NOTE — Patient Instructions (Signed)
Medication Instructions:  Your physician has recommended you make the following change in your medication:  Stop Metoprolol Stop Propanolol PRN Start Propanolol 20 mg tablets every 8 hours   Labwork: None  Testing/Procedures: None  Follow-Up: Follow up with Dr. Dellia Cloud in 6 months.   Any Other Special Instructions Will Be Listed Below (If Applicable).     If you need a refill on your cardiac medications before your next appointment, please call your pharmacy.

## 2022-09-26 NOTE — Telephone Encounter (Signed)
Orders entered. Patient notified via MyChart.  

## 2022-09-26 NOTE — Telephone Encounter (Signed)
Per Dr. Dellia Cloud:  Patient had dilated ascending aorta, 44 mm on echo from 2022. I did not discuss this with the patient during the clinic visit. He will need CTA thoracic aorta for accurate measurements of his aorta. Serum creatinine was within normal limits from last year. Can you call the patient and let him know of this new study. Thank you.

## 2022-10-02 DIAGNOSIS — Z125 Encounter for screening for malignant neoplasm of prostate: Secondary | ICD-10-CM | POA: Diagnosis not present

## 2022-10-02 DIAGNOSIS — I25118 Atherosclerotic heart disease of native coronary artery with other forms of angina pectoris: Secondary | ICD-10-CM | POA: Diagnosis not present

## 2022-10-02 DIAGNOSIS — E118 Type 2 diabetes mellitus with unspecified complications: Secondary | ICD-10-CM | POA: Diagnosis not present

## 2022-10-02 DIAGNOSIS — Z79899 Other long term (current) drug therapy: Secondary | ICD-10-CM | POA: Diagnosis not present

## 2022-10-02 DIAGNOSIS — D508 Other iron deficiency anemias: Secondary | ICD-10-CM | POA: Diagnosis not present

## 2022-10-02 DIAGNOSIS — I10 Essential (primary) hypertension: Secondary | ICD-10-CM | POA: Diagnosis not present

## 2022-10-02 DIAGNOSIS — E7800 Pure hypercholesterolemia, unspecified: Secondary | ICD-10-CM | POA: Diagnosis not present

## 2022-10-03 ENCOUNTER — Other Ambulatory Visit (HOSPITAL_COMMUNITY)
Admission: RE | Admit: 2022-10-03 | Discharge: 2022-10-03 | Disposition: A | Discharge status: Home / Self Care | Payer: 59 | Source: Ambulatory Visit | Attending: Internal Medicine | Admitting: Internal Medicine

## 2022-10-03 DIAGNOSIS — Z79899 Other long term (current) drug therapy: Secondary | ICD-10-CM | POA: Diagnosis not present

## 2022-10-03 LAB — COMPREHENSIVE METABOLIC PANEL
ALT: 38 U/L (ref 0–44)
AST: 30 U/L (ref 15–41)
Albumin: 4.3 g/dL (ref 3.5–5.0)
Alkaline Phosphatase: 121 U/L (ref 38–126)
Anion gap: 10 (ref 5–15)
BUN: 9 mg/dL (ref 6–20)
CO2: 28 mmol/L (ref 22–32)
Calcium: 9.1 mg/dL (ref 8.9–10.3)
Chloride: 100 mmol/L (ref 98–111)
Creatinine, Ser: 0.63 mg/dL (ref 0.61–1.24)
GFR, Estimated: >60 mL/min (ref >60)
Glucose, Bld: 168 mg/dL — ABNORMAL HIGH (ref 70–99)
Potassium: 3.8 mmol/L (ref 3.5–5.1)
Sodium: 138 mmol/L (ref 135–145)
Total Bilirubin: 1.1 mg/dL (ref 0.3–1.2)
Total Protein: 7.6 g/dL (ref 6.5–8.1)

## 2022-10-03 LAB — LIPID PANEL
Cholesterol: 140 mg/dL (ref 0–200)
HDL: 24 mg/dL — ABNORMAL LOW (ref >40)
LDL Cholesterol: 93 mg/dL (ref 0–99)
Total CHOL/HDL Ratio: 5.8 RATIO
Triglycerides: 113 mg/dL (ref <150)
VLDL: 23 mg/dL (ref 0–40)

## 2022-10-03 LAB — URINALYSIS, COMPLETE (UACMP) WITH MICROSCOPIC
Bacteria, UA: NONE SEEN
Bilirubin Urine: NEGATIVE
Glucose, UA: NEGATIVE mg/dL
Hgb urine dipstick: NEGATIVE
Ketones, ur: 5 mg/dL — AB
Leukocytes,Ua: NEGATIVE
Nitrite: NEGATIVE
Protein, ur: 30 mg/dL — AB
Specific Gravity, Urine: 1.028 (ref 1.005–1.030)
pH: 5 (ref 5.0–8.0)

## 2022-10-03 LAB — IRON AND TIBC
Iron: 95 ug/dL (ref 45–182)
Saturation Ratios: 27 % (ref 17.9–39.5)
TIBC: 352 ug/dL (ref 250–450)
UIBC: 257 ug/dL

## 2022-10-03 LAB — FERRITIN: Ferritin: 100 ng/mL (ref 24–336)

## 2022-10-03 LAB — HEMOGLOBIN A1C
Hgb A1c MFr Bld: 6.7 % — ABNORMAL HIGH (ref 4.8–5.6)
Mean Plasma Glucose: 145.59 mg/dL

## 2022-10-03 LAB — TSH: TSH: 0.472 u[IU]/mL (ref 0.350–4.500)

## 2022-10-03 LAB — VITAMIN B12: Vitamin B-12: 398 pg/mL (ref 180–914)

## 2022-10-03 LAB — PSA: Prostatic Specific Antigen: 0.94 ng/mL (ref 0.00–4.00)

## 2022-10-06 ENCOUNTER — Other Ambulatory Visit (HOSPITAL_COMMUNITY): Payer: Self-pay

## 2022-10-06 ENCOUNTER — Other Ambulatory Visit: Payer: Self-pay

## 2022-10-06 ENCOUNTER — Other Ambulatory Visit: Payer: Self-pay | Admitting: Internal Medicine

## 2022-10-06 ENCOUNTER — Encounter: Payer: Self-pay | Admitting: Internal Medicine

## 2022-10-06 MED ORDER — CLOPIDOGREL BISULFATE 75 MG PO TABS: 75.0000 mg | ORAL_TABLET | Freq: Every day | ORAL | 3 refills | Status: DC

## 2022-10-08 ENCOUNTER — Encounter (HOSPITAL_COMMUNITY): Payer: Self-pay | Admitting: Gastroenterology

## 2022-10-08 NOTE — Progress Notes (Signed)
Attempted to obtain medical history via telephone, unable to reach at this time. HIPAA compliant voicemail message left requesting return call to pre surgical testing department. 

## 2022-10-10 ENCOUNTER — Other Ambulatory Visit: Payer: Self-pay

## 2022-10-10 ENCOUNTER — Inpatient Hospital Stay: Payer: 59 | Attending: Physician Assistant

## 2022-10-10 ENCOUNTER — Other Ambulatory Visit (HOSPITAL_COMMUNITY): Payer: Self-pay

## 2022-10-10 ENCOUNTER — Other Ambulatory Visit: Payer: Self-pay | Admitting: Internal Medicine

## 2022-10-10 DIAGNOSIS — D509 Iron deficiency anemia, unspecified: Secondary | ICD-10-CM | POA: Insufficient documentation

## 2022-10-10 MED ORDER — CLOPIDOGREL BISULFATE 75 MG PO TABS
75.0000 mg | ORAL_TABLET | Freq: Every day | ORAL | 3 refills | Status: DC
  Filled 2022-10-10: qty 90, 90d supply, fill #0

## 2022-10-13 ENCOUNTER — Other Ambulatory Visit: Payer: Self-pay

## 2022-10-13 ENCOUNTER — Encounter (HOSPITAL_COMMUNITY): Payer: Self-pay | Admitting: Hematology

## 2022-10-13 ENCOUNTER — Inpatient Hospital Stay: Payer: 59

## 2022-10-13 DIAGNOSIS — D509 Iron deficiency anemia, unspecified: Secondary | ICD-10-CM | POA: Diagnosis not present

## 2022-10-13 DIAGNOSIS — D649 Anemia, unspecified: Secondary | ICD-10-CM

## 2022-10-13 DIAGNOSIS — D508 Other iron deficiency anemias: Secondary | ICD-10-CM

## 2022-10-13 LAB — CBC WITH DIFFERENTIAL/PLATELET
Abs Immature Granulocytes: 0.02 10*3/uL (ref 0.00–0.07)
Basophils Absolute: 0 10*3/uL (ref 0.0–0.1)
Basophils Relative: 1 %
Eosinophils Absolute: 0.2 10*3/uL (ref 0.0–0.5)
Eosinophils Relative: 3 %
HCT: 47 % (ref 39.0–52.0)
Hemoglobin: 15.7 g/dL (ref 13.0–17.0)
Immature Granulocytes: 0 %
Lymphocytes Relative: 18 %
Lymphs Abs: 1.3 10*3/uL (ref 0.7–4.0)
MCH: 30.4 pg (ref 26.0–34.0)
MCHC: 33.4 g/dL (ref 30.0–36.0)
MCV: 91.1 fL (ref 80.0–100.0)
Monocytes Absolute: 0.6 10*3/uL (ref 0.1–1.0)
Monocytes Relative: 8 %
Neutro Abs: 5.1 10*3/uL (ref 1.7–7.7)
Neutrophils Relative %: 70 %
Platelets: 161 10*3/uL (ref 150–400)
RBC: 5.16 MIL/uL (ref 4.22–5.81)
RDW: 13.1 % (ref 11.5–15.5)
WBC: 7.2 10*3/uL (ref 4.0–10.5)
nRBC: 0 % (ref 0.0–0.2)

## 2022-10-13 LAB — IRON AND TIBC
Iron: 105 ug/dL (ref 45–182)
Saturation Ratios: 34 % (ref 17.9–39.5)
TIBC: 310 ug/dL (ref 250–450)
UIBC: 205 ug/dL

## 2022-10-13 LAB — FERRITIN: Ferritin: 76 ng/mL (ref 24–336)

## 2022-10-14 ENCOUNTER — Other Ambulatory Visit (HOSPITAL_COMMUNITY): Payer: Self-pay

## 2022-10-16 ENCOUNTER — Encounter (HOSPITAL_COMMUNITY): Payer: Self-pay | Admitting: Gastroenterology

## 2022-10-16 ENCOUNTER — Ambulatory Visit (HOSPITAL_BASED_OUTPATIENT_CLINIC_OR_DEPARTMENT_OTHER): Payer: 59 | Admitting: Certified Registered Nurse Anesthetist

## 2022-10-16 ENCOUNTER — Telehealth: Payer: Self-pay

## 2022-10-16 ENCOUNTER — Ambulatory Visit (HOSPITAL_COMMUNITY)
Admission: RE | Admit: 2022-10-16 | Discharge: 2022-10-16 | Disposition: A | Discharge status: Home / Self Care | Payer: 59 | Attending: Gastroenterology | Admitting: Gastroenterology

## 2022-10-16 ENCOUNTER — Other Ambulatory Visit (HOSPITAL_COMMUNITY): Payer: Self-pay

## 2022-10-16 ENCOUNTER — Other Ambulatory Visit: Payer: Self-pay

## 2022-10-16 ENCOUNTER — Ambulatory Visit (HOSPITAL_COMMUNITY): Payer: 59 | Admitting: Certified Registered Nurse Anesthetist

## 2022-10-16 ENCOUNTER — Encounter (HOSPITAL_COMMUNITY)
Admission: RE | Disposition: A | Discharge status: Home / Self Care | Payer: Self-pay | Source: Home / Self Care | Attending: Gastroenterology

## 2022-10-16 DIAGNOSIS — K2289 Other specified disease of esophagus: Secondary | ICD-10-CM | POA: Diagnosis not present

## 2022-10-16 DIAGNOSIS — I10 Essential (primary) hypertension: Secondary | ICD-10-CM

## 2022-10-16 DIAGNOSIS — K9589 Other complications of other bariatric procedure: Secondary | ICD-10-CM | POA: Diagnosis not present

## 2022-10-16 DIAGNOSIS — I2510 Atherosclerotic heart disease of native coronary artery without angina pectoris: Secondary | ICD-10-CM | POA: Diagnosis not present

## 2022-10-16 DIAGNOSIS — R1310 Dysphagia, unspecified: Secondary | ICD-10-CM | POA: Insufficient documentation

## 2022-10-16 DIAGNOSIS — K9189 Other postprocedural complications and disorders of digestive system: Secondary | ICD-10-CM

## 2022-10-16 DIAGNOSIS — Z87891 Personal history of nicotine dependence: Secondary | ICD-10-CM

## 2022-10-16 DIAGNOSIS — K219 Gastro-esophageal reflux disease without esophagitis: Secondary | ICD-10-CM | POA: Insufficient documentation

## 2022-10-16 DIAGNOSIS — K3189 Other diseases of stomach and duodenum: Secondary | ICD-10-CM | POA: Insufficient documentation

## 2022-10-16 DIAGNOSIS — I252 Old myocardial infarction: Secondary | ICD-10-CM

## 2022-10-16 DIAGNOSIS — Z9884 Bariatric surgery status: Secondary | ICD-10-CM | POA: Diagnosis not present

## 2022-10-16 DIAGNOSIS — E119 Type 2 diabetes mellitus without complications: Secondary | ICD-10-CM | POA: Diagnosis not present

## 2022-10-16 DIAGNOSIS — Z7902 Long term (current) use of antithrombotics/antiplatelets: Secondary | ICD-10-CM | POA: Insufficient documentation

## 2022-10-16 DIAGNOSIS — G4733 Obstructive sleep apnea (adult) (pediatric): Secondary | ICD-10-CM | POA: Insufficient documentation

## 2022-10-16 HISTORY — PX: BILIARY STENT PLACEMENT: SHX5538

## 2022-10-16 HISTORY — PX: BIOPSY: SHX5522

## 2022-10-16 HISTORY — PX: ESOPHAGOGASTRODUODENOSCOPY (EGD) WITH PROPOFOL: SHX5813

## 2022-10-16 LAB — GLUCOSE, CAPILLARY: Glucose-Capillary: 113 mg/dL — ABNORMAL HIGH (ref 70–99)

## 2022-10-16 SURGERY — ESOPHAGOGASTRODUODENOSCOPY (EGD) WITH PROPOFOL
Anesthesia: Monitor Anesthesia Care

## 2022-10-16 MED ORDER — PROPOFOL 1000 MG/100ML IV EMUL
INTRAVENOUS | Status: AC
  Filled 2022-10-16: qty 100

## 2022-10-16 MED ORDER — PROPOFOL 500 MG/50ML IV EMUL
INTRAVENOUS | Status: DC | PRN
  Administered 2022-10-16: 125 ug/kg/min via INTRAVENOUS

## 2022-10-16 MED ORDER — LACTATED RINGERS IV SOLN: INTRAVENOUS | Status: DC

## 2022-10-16 MED ORDER — LIDOCAINE 2% (20 MG/ML) 5 ML SYRINGE
INTRAMUSCULAR | Status: DC | PRN
  Administered 2022-10-16: 100 mg via INTRAVENOUS

## 2022-10-16 MED ORDER — CLOPIDOGREL BISULFATE 75 MG PO TABS
75.0000 mg | ORAL_TABLET | Freq: Every day | ORAL | 3 refills | Status: DC
  Filled 2022-10-16: qty 90, 90d supply, fill #0

## 2022-10-16 MED ORDER — PROPOFOL 10 MG/ML IV BOLUS
INTRAVENOUS | Status: AC
  Filled 2022-10-16: qty 20

## 2022-10-16 MED ORDER — SODIUM CHLORIDE 0.9 % IV SOLN: INTRAVENOUS | Status: DC

## 2022-10-16 MED ORDER — PROPOFOL 10 MG/ML IV BOLUS
INTRAVENOUS | Status: DC | PRN
  Administered 2022-10-16 (×2): 20 mg via INTRAVENOUS
  Administered 2022-10-16: 40 mg via INTRAVENOUS

## 2022-10-16 SURGICAL SUPPLY — 15 items

## 2022-10-16 NOTE — Telephone Encounter (Signed)
-----   Message from Irving Copas., MD sent at 10/16/2022 10:06 AM EST ----- Regarding: Followup Hector Curry, This patient needs an EGD recall for AXIOS stent removal in 3 months. Thanks. GM

## 2022-10-16 NOTE — Progress Notes (Signed)
VISIT NOT COMPLETED: We were unable to reach Mr. Lowell via telephone for his visit today, despite multiple attempts.  MyChart message has been sent to patient and we will reschedule him at his convenience.  Harriett Rush, PA-C  10/17/22 6:20 PM

## 2022-10-16 NOTE — Anesthesia Preprocedure Evaluation (Addendum)
Anesthesia Evaluation  Patient identified by MRN, date of birth, ID band Patient awake    Reviewed: Allergy & Precautions, NPO status , Patient's Chart, lab work & pertinent test results, reviewed documented beta blocker date and time   History of Anesthesia Complications Negative for: history of anesthetic complications  Airway Mallampati: II  TM Distance: >3 FB Neck ROM: Full    Dental  (+) Missing,    Pulmonary sleep apnea and Continuous Positive Airway Pressure Ventilation , former smoker   Pulmonary exam normal        Cardiovascular hypertension, Pt. on home beta blockers and Pt. on medications + CAD and + Past MI  Normal cardiovascular exam+ dysrhythmias Supra Ventricular Tachycardia      Neuro/Psych negative neurological ROS  negative psych ROS   GI/Hepatic Neg liver ROS,GERD  Medicated,,Gastrojejunal anastomosis stricture   Endo/Other  diabetes, Type 2    Renal/GU negative Renal ROS  negative genitourinary   Musculoskeletal  (+) Arthritis ,    Abdominal   Peds  Hematology negative hematology ROS (+)   Anesthesia Other Findings Day of surgery medications reviewed with patient.  Reproductive/Obstetrics negative OB ROS                             Anesthesia Physical Anesthesia Plan  ASA: 3  Anesthesia Plan: MAC   Post-op Pain Management: Minimal or no pain anticipated   Induction:   PONV Risk Score and Plan: 1 and Treatment may vary due to age or medical condition and Propofol infusion  Airway Management Planned: Natural Airway and Nasal Cannula  Additional Equipment: None  Intra-op Plan:   Post-operative Plan:   Informed Consent: I have reviewed the patients History and Physical, chart, labs and discussed the procedure including the risks, benefits and alternatives for the proposed anesthesia with the patient or authorized representative who has indicated his/her  understanding and acceptance.       Plan Discussed with: CRNA  Anesthesia Plan Comments:        Anesthesia Quick Evaluation

## 2022-10-16 NOTE — H&P (Signed)
GASTROENTEROLOGY PROCEDURE H&P NOTE   Primary Care Physician: [name], MD  HPI: Hector Curry is a 62 y.o. male who presents for EGD for attempt at AXIOS placement for anastomosis stricture failed previous attempt at stricturotomy awaiting surgical evaluation for redo.  Past Medical History:  Diagnosis Date   Allergic rhinitis    Atrial tachycardia    CAD (coronary artery disease)    a. 05/08/2015 Cath: no significant CAD, LVEF nl-->Med; b. 07/2017 MV: attenuation artifact, no ischemia, EF 65%-->Low risk; c. 03/2021 NSTEMI/PCI: LM nl, LAD 80p (3.0x18 Onyx Frontier DES), D1 90 (too small for PCI), LCX nl, RCA nl.   Diabetes mellitus without complication (HCC)    Diastolic dysfunction    a. 04/2015 Echo: EF 60-65%; b. 05/2016 Echo: EF 60-65%, GrI DD; c. 07/2017 Echo: EF 55-60%, Ao root 89m; d. 03/2019 Echo: EF 55-60%, Ao root/Asc Ao 420m e. 03/2021 Echo: EF 50-55%, apical HK, mod asymm basal-septal hypertrophy. Nl RV fxn. Asc Ao 4445m  Dilated aortic root ([place]  a. 07/2017 Echo: 31m38m root - mildly dil; b. 03/2019 Echo: Ao root 31mm32m 03/2021 Echo: Asc Ao 44mm.63miverticulosis 10/03/2014   Dysrhythmia    Esophageal stricture    a.  In setting of lap band June 2022-stricture at the GJ anaVeronaomosis requiring gastrostomy tube; b. 05/2021 s/p esoph stenting (15mm);23m12/2022 s/p esoph stenting (20mm). 40mmily history of premature CAD    a. father passed from MI at 49   GER75(gastroesophageal reflux disease)    GIB (gastrointestinal bleeding)    a. 07/2021 following esoph stenting.   Hemorrhoids    a. internal hemorrhoids s/p surgery 1999   History of kidney stones    History of tobacco abuse    Hyperlipidemia    Hyperplastic colon polyp 10/03/2014   a. x 2    Hypertension    Inflammatory arthritis    a. CCP antibodies & x-rays negative. Rheumatoid factor 14, felt to be crystaline over RA or psoriatic   Iron deficiency anemia 02/05/2022   Morbid obesity (HCC)    [place] s/p LAP-BAND - complicated by esoph stricture.   OSA (obstructive sleep apnea)    a. on CPAP   Osteoarthritis    PSVT (paroxysmal supraventricular tachycardia)    a. 48 hr Holter 04/2015: NSR w/ rare PVC, short runs of narrow complex tachycardiac, possible atrial tach, longest run 7 beats, PACs noted (2% of all beats 3600 total) they did not seem to correlate w/ significant arrythmia; b. 08/2017 Event monitor: no significant arrhythmias.   Past Surgical History:  Procedure Laterality Date   BALLOON DILATION N/A 08/14/2021   Procedure: BALLOON DILATION;  Surgeon: Mansouraty, Betha Shadix Telford NabLocation: [facility];  Service: Gastroenterology;  Laterality: N/A;   BALLOON DILATION N/A 02/20/2022   Procedure: BALLOON DILATION;  Surgeon: Jacobs, Milus Banisterocation: [facility];  Service: Gastroenterology;  Laterality: N/A;   BALLOON DILATION N/A 03/27/2022   Procedure: BALLOON DILATION;  Surgeon: MansouraRush Landmark Telford NabLocation: [facility]: [facility]ology;  Laterality: N/A;   BARIATRIC SURGERY  01/2021   lap band    BILIARY STENT PLACEMENT N/A 08/14/2021   Procedure: AXIOS STENT PLACEMENT;  Surgeon: MansouraIrving CopasLocation: [facility];  Service: Gastroenterology;  Laterality: N/A;   BIOPSY  07/08/2021   Procedure: BIOPSY;  Surgeon: MansouraRush Landmark Telford NabLocation: [facility];  Service: Gastroenterology;;  BIOPSY  08/14/2021   Procedure: BIOPSY;  Surgeon: Rush Landmark Telford Nab., MD;  [facility];  Service: Gastroenterology;;   CARDIAC CATHETERIZATION N/A 05/08/2015   Procedure: Left Heart Cath and Coronary Angiography;  Surgeon: Jettie Booze, MD;  Location: [place] [facility];  Service: Cardiovascular;  Laterality: N/A;   CARPAL TUNNEL RELEASE Left    CHOLECYSTECTOMY     COLONOSCOPY  06/27/2005   COLONOSCOPY  10/03/2014   CORONARY STENT INTERVENTION N/A 04/15/2021   Procedure: CORONARY STENT INTERVENTION;  Surgeon: Lorretta Harp, MD;  Location: [place] [facility];  Service: Cardiovascular;  Laterality: N/A;   DUODENAL STENT PLACEMENT N/A 11/18/2021   Procedure: DUODENAL STENT PLACEMENT;  Surgeon: Rush Landmark Telford Nab., MD;  [facility];  Service: Gastroenterology;  Laterality: N/A;  axios placed at [place] anastamosis   DUODENAL STENT PLACEMENT  03/27/2022   Procedure: GASTRIC STENT PLACEMENT;  Surgeon: Rush Landmark Telford Nab., MD;  Location: [place];  Service: Gastroenterology;;   ESOPHAGEAL DILATION  05/27/2021   Procedure: ESOPHAGEAL DILATION;  Surgeon: Irving Copas., MD;  [facility];  Service: Gastroenterology;;   ESOPHAGEAL DILATION  01/23/2022   Procedure: ESOPHAGEAL DILATION;  Surgeon: Irving Copas., MD;  Location: [place];  Service: Gastroenterology;;   ESOPHAGEAL STENT PLACEMENT N/A 05/27/2021   Procedure: ESOPHAGEAL STENT PLACEMENT;  Surgeon: Irving Copas., MD;  [facility];  Service: Gastroenterology;  Laterality: N/A;   ESOPHAGOGASTRODUODENOSCOPY  06/27/2005   ESOPHAGOGASTRODUODENOSCOPY N/A 03/19/2021   Procedure: ESOPHAGOGASTRODUODENOSCOPY (EGD);  Surgeon: Kieth Brightly, Arta Bruce, MD;  [facility];  Service: General;  Laterality: N/A;   ESOPHAGOGASTRODUODENOSCOPY N/A 08/16/2021   Procedure: ESOPHAGOGASTRODUODENOSCOPY (EGD);  Surgeon: Irving Copas., MD;  Location: [place];  Service: Gastroenterology;  Laterality: N/A;   ESOPHAGOGASTRODUODENOSCOPY (EGD) WITH PROPOFOL N/A 05/27/2021   Procedure: ESOPHAGOGASTRODUODENOSCOPY (EGD) WITH PROPOFOL;  Surgeon: Rush Landmark Telford Nab., MD;  [facility];  Service: Gastroenterology;  Laterality: N/A;   ESOPHAGOGASTRODUODENOSCOPY (EGD) WITH PROPOFOL N/A 07/08/2021   Procedure: ESOPHAGOGASTRODUODENOSCOPY (EGD) WITH PROPOFOL;  Surgeon: Rush Landmark Telford Nab., MD;  [facility];  Service: Gastroenterology;  Laterality: N/A;  fluoro    ESOPHAGOGASTRODUODENOSCOPY (EGD) WITH PROPOFOL N/A 08/14/2021   Procedure: ESOPHAGOGASTRODUODENOSCOPY (EGD) WITH PROPOFOL;  Surgeon: Rush Landmark Telford Nab., MD;  [facility];  Service: Gastroenterology;  Laterality: N/A;  fluoro Axios stent (20 mm)   ESOPHAGOGASTRODUODENOSCOPY (EGD) WITH PROPOFOL N/A 10/16/2021   Procedure: ESOPHAGOGASTRODUODENOSCOPY (EGD) WITH PROPOFOL;  Surgeon: Rush Landmark Telford Nab., MD;  [facility];  Service: Gastroenterology;  Laterality: N/A;  axios stent pull fluoro   ESOPHAGOGASTRODUODENOSCOPY (EGD) WITH PROPOFOL N/A 11/18/2021   Procedure: ESOPHAGOGASTRODUODENOSCOPY (EGD) WITH PROPOFOL;  Surgeon: Rush Landmark Telford Nab., MD;  [facility];  Service: Gastroenterology;  Laterality: N/A;   ESOPHAGOGASTRODUODENOSCOPY (EGD) WITH PROPOFOL N/A 01/23/2022   Procedure: ESOPHAGOGASTRODUODENOSCOPY (EGD) WITH PROPOFOL;  Surgeon: Rush Landmark Telford Nab., MD;  Location: [place];  Service: Gastroenterology;  Laterality: N/A;   ESOPHAGOGASTRODUODENOSCOPY (EGD) WITH PROPOFOL N/A 02/20/2022   Procedure: ESOPHAGOGASTRODUODENOSCOPY (EGD) WITH PROPOFOL;  Surgeon: Milus Banister, MD;  [facility];  Service: Gastroenterology;  Laterality: N/A;   ESOPHAGOGASTRODUODENOSCOPY (EGD) WITH PROPOFOL N/A 03/27/2022   Procedure: ESOPHAGOGASTRODUODENOSCOPY (EGD) WITH PROPOFOL;  Surgeon: Rush Landmark Telford Nab., MD;  Location: [place];  Service: Gastroenterology;  Laterality: N/A;   ESOPHAGOGASTRODUODENOSCOPY (EGD) WITH PROPOFOL N/A 08/14/2022   Procedure: ESOPHAGOGASTRODUODENOSCOPY (EGD) WITH PROPOFOL;  Surgeon: Rush Landmark Telford Nab., MD;  [facility];  Service: Gastroenterology;  Laterality: N/A;   FLEXIBLE SIGMOIDOSCOPY N/A  08/16/2021   Procedure: FLEXIBLE SIGMOIDOSCOPY;  Surgeon: Rush Landmark Telford Nab., MD;  Location: [place];  Service: Gastroenterology;  Laterality: N/A;   FOREIGN BODY REMOVAL  02/20/2022   Procedure: FOREIGN BODY  REMOVAL;  Surgeon: Milus Banister, MD;  [facility];  Service: Gastroenterology;;   FOREIGN BODY REMOVAL ESOPHAGEAL  03/27/2022   Procedure: REMOVAL FOREIGN BODY;  Surgeon: Rush Landmark Telford Nab., MD;  Location: [place];  Service: Gastroenterology;;   GASTRIC ROUX-EN-Y N/A 02/18/2021   Procedure: LAPAROSCOPIC ROUX-EN-Y GASTRIC BYPASS WITH UPPER ENDOSCOPY,;  Surgeon: Kieth Brightly Arta Bruce, MD;  [facility];  Service: General;  Laterality: N/A;   GASTROINTESTINAL STENT REMOVAL  01/23/2022   Procedure: Theora Master STENT REMOVAL;  Surgeon: Irving Copas., MD;  Location: [place];  Service: Gastroenterology;;   HEMORRHOID SURGERY  08/25/1997   IR [place] TUBE CHANGE  03/27/2021   LAPAROSCOPIC INSERTION GASTROSTOMY TUBE N/A 03/20/2021   Procedure: LAPAROSCOPIC INSERTION GASTROSTOMY TUBE;  Surgeon: Mickeal Skinner, MD;  [facility];  Service: General;  Laterality: N/A;   LEFT HEART CATH AND CORONARY ANGIOGRAPHY N/A 04/15/2021   Procedure: LEFT HEART CATH AND CORONARY ANGIOGRAPHY;  Surgeon: Lorretta Harp, MD;  Location: [place] [facility];  Service: Cardiovascular;  Laterality: N/A;   MOUTH SURGERY     removed area which was benign   STENT REMOVAL  07/08/2021   Procedure: STENT REMOVAL;  Surgeon: Irving Copas., MD;  [facility];  Service: Gastroenterology;;   Lavell Islam REMOVAL  10/16/2021   Procedure: STENT REMOVAL;  Surgeon: Irving Copas., MD;  [facility];  Service: Gastroenterology;;   Lavell Islam REMOVAL  08/14/2022   Procedure: STENT REMOVAL;  Surgeon: Irving Copas., MD;  [facility];  Service: Gastroenterology;;   STERIOD INJECTION  01/23/2022   Procedure: STEROID INJECTION;  Surgeon: Irving Copas., MD;  Location: [place];  Service: Gastroenterology;;   SUBMUCOSAL INJECTION  11/18/2021   Procedure: SUBMUCOSAL INJECTION;  Surgeon: Irving Copas., MD;  [facility];  Service:  Gastroenterology;;   Lia [place] INJECTION  08/14/2022   Procedure: SUBMUCOSAL INJECTION;  Surgeon: Irving Copas., MD;  [facility];  Service: Gastroenterology;;   TONSILLECTOMY     UPPER GI ENDOSCOPY N/A 03/20/2021   Procedure: UPPER GI ENDOSCOPY;  Surgeon: Kinsinger, Arta Bruce, MD;  [facility];  Service: General;  Laterality: N/A;   No current facility-administered medications for this encounter.   No current facility-administered medications for this encounter. Allergies  Allergen Reactions   Other Other (See Comments)    Cats   Family History  Problem Relation Age of Onset   Hypertension Mother    Diabetes Mother    Heart attack Father 19   CAD Father    Hypertension Sister    Diabetes Other    Hypertension Other    Heart disease Other    Colon cancer Neg Hx    Esophageal cancer Neg Hx    Pancreatic cancer Neg Hx    Stomach cancer Neg Hx    Liver disease Neg Hx    Inflammatory bowel disease Neg Hx    Rectal cancer Neg Hx    Social History   Socioeconomic History   Marital status: Married    Spouse name: Not on file   Number of children: 3   Years of education: Not on file   Highest education level: Not on file  Occupational History   Occupation: IT Designer, multimedia  Tobacco Use   Smoking  status: Former    Packs/day: 1.00    Years: 15.00    Total pack years: 15.00    Types: Cigarettes    Quit date: 08/05/1989    Years since quitting: 33.2   Smokeless tobacco: Never  Vaping Use   Vaping Use: Never used  Substance and Sexual Activity   Alcohol use: Yes    Comment: occ   Drug use: Never   Sexual activity: Not on file  Other Topics Concern   Not on file  Social History Narrative   Not on file   Social Determinants of Health   Financial Resource Strain: Not on file  Food Insecurity: Not on file  Transportation Needs: Not on file  Physical Activity: Not on file  Stress: Not on file  Social Connections: Not on file  Intimate  Partner Violence: Not on file    Physical Exam: There were no vitals filed for this visit. There is no height or weight on file to calculate BMI. GEN: NAD EYE: Sclerae anicteric ENT: MMM CV: Non-tachycardic GI: Soft, NT/ND NEURO:  Alert & Oriented x 3  Lab Results: Recent Labs    10/13/22 0854  WBC 7.2  HGB 15.7  HCT 47.0  PLT 161   BMET No results for input(s): "NA", "K", "CL", "CO2", "GLUCOSE", "BUN", "CREATININE", "CALCIUM" in the last 72 hours. LFT No results for input(s): "PROT", "ALBUMIN", "AST", "ALT", "ALKPHOS", "BILITOT", "BILIDIR", "IBILI" in the last 72 hours. PT/INR No results for input(s): "LABPROT", "INR" in the last 72 hours.   Impression / Plan: This is a 62 y.o.male  who presents for EGD for attempt at AXIOS placement for anastomosis stricture failed previous attempt at stricturotomy awaiting surgical evaluation for redo.  The risks and benefits of endoscopic evaluation/treatment were discussed with the patient and/or family; these include but are not limited to the risk of perforation, infection, bleeding, missed lesions, lack of diagnosis, severe illness requiring hospitalization, as well as anesthesia and sedation related illnesses.  The patient's history has been reviewed, patient examined, no change in status, and deemed stable for procedure.  The patient and/or family is agreeable to proceed.    Justice Britain, MD [facility] Office # [phone]

## 2022-10-16 NOTE — Anesthesia Postprocedure Evaluation (Signed)
Anesthesia Post Note  Patient: Hector Curry  Procedure(s) Performed: ESOPHAGOGASTRODUODENOSCOPY (EGD) WITH PROPOFOL BIOPSY BILIARY STENT PLACEMENT     Patient location during evaluation: PACU Anesthesia Type: MAC Level of consciousness: awake and alert Pain management: pain level controlled Vital Signs Assessment: post-procedure vital signs reviewed and stable Respiratory status: spontaneous breathing, nonlabored ventilation and respiratory function stable Cardiovascular status: blood pressure returned to baseline Postop Assessment: no apparent nausea or vomiting Anesthetic complications: no   No notable events documented.  Last Vitals:  Vitals:   10/16/22 1000 10/16/22 1010  BP: 109/76 134/74  Pulse: 63 63  Resp: 14 17  Temp:    SpO2: 96% 96%    Last Pain:  Vitals:   10/16/22 0953  TempSrc: Temporal  PainSc: 0-No pain                 Marthenia Rolling

## 2022-10-16 NOTE — Discharge Instructions (Signed)
YOU HAD AN ENDOSCOPIC PROCEDURE TODAY: Refer to the procedure report and other information in the discharge instructions given to you for any specific questions about what was found during the examination. If this information does not answer your questions, please call [place] office at [phone] to clarify.   YOU SHOULD EXPECT: Some feelings of bloating in the abdomen. Passage of more gas than usual. Walking can help get rid of the air that was put into your GI tract during the procedure and reduce the bloating. If you had a lower endoscopy (such as a colonoscopy or flexible sigmoidoscopy) you may notice spotting of blood in your stool or on the toilet paper. Some abdominal soreness may be present for a day or two, also.  DIET: Your first meal following the procedure should be a light meal and then it is ok to progress to your normal diet. A half-sandwich or bowl of soup is an example of a good first meal. Heavy or fried foods are harder to digest and may make you feel nauseous or bloated. Drink plenty of fluids but you should avoid alcoholic beverages for 24 hours. If you had a esophageal dilation, please see attached instructions for diet.    ACTIVITY: Your care partner should take you home directly after the procedure. You should plan to take it easy, moving slowly for the rest of the day. You can resume normal activity the day after the procedure however YOU SHOULD NOT DRIVE, use power tools, machinery or perform tasks that involve climbing or major physical exertion for 24 hours (because of the sedation medicines used during the test).   SYMPTOMS TO REPORT IMMEDIATELY: A gastroenterologist can be reached at any hour. Please call [phone]  for any of the following symptoms:   Following upper endoscopy (EGD, EUS, ERCP, esophageal dilation) Vomiting of blood or coffee ground material  New, significant abdominal pain  New, significant chest pain or pain under the shoulder blades  Painful or  persistently difficult swallowing  New shortness of breath  Black, tarry-looking or red, bloody stools  FOLLOW UP:  If any biopsies were taken you will be contacted by phone or by letter within the next 1-3 weeks. Call [phone]  if you have not heard about the biopsies in 3 weeks.  Please also call with any specific questions about appointments or follow up tests.  

## 2022-10-16 NOTE — Transfer of Care (Signed)
Immediate Anesthesia Transfer of Care Note  Patient: Hector Curry  Procedure(s) Performed: ESOPHAGOGASTRODUODENOSCOPY (EGD) WITH PROPOFOL BIOPSY BILIARY STENT PLACEMENT  Patient Location: Endoscopy Unit  Anesthesia Type:MAC  Level of Consciousness: drowsy and patient cooperative  Airway & Oxygen Therapy: Patient Spontanous Breathing and Patient connected to face mask oxygen  Post-op Assessment: Report given to RN and Post -op Vital signs reviewed and stable  Post vital signs: Reviewed and stable  Last Vitals:  Vitals Value Taken Time  BP 117/76 10/16/22 0953  Temp 36.7 C 10/16/22 0953  Pulse 63 10/16/22 0953  Resp 19 10/16/22 0953  SpO2 100 % 10/16/22 0953  Vitals shown include unvalidated device data.  Last Pain:  Vitals:   10/16/22 0953  TempSrc: Temporal  PainSc: 0-No pain         Complications: No notable events documented.

## 2022-10-16 NOTE — Op Note (Signed)
[facility] Patient Name: Hector Curry Procedure Date: 10/16/2022 MRN: [id number] Attending MD: Justice Britain , MD, [id number] Date of Birth: 03/09/1961 CSN: [id number] Age: 62 Admit Type: Outpatient Procedure:                Upper GI endoscopy Indications:              Dysphagia, Post-bariatric anastomotic stenosis,                            Follow-up of post-bariatric anastomotic stenosis Providers:                Justice Britain, MD, Burtis Junes, RN, Luan Moore, Technician, Adair Laundry, CRNA Referring MD:             Arta Bruce Kinsinger MD, MD, Leonie Douglas. Sparks, MD Medicines:                 Complications:            No immediate complications. Estimated Blood Loss:     Estimated blood loss was minimal. Procedure:                Pre-Anesthesia Assessment:                           - Prior to the procedure, a History and Physical                            was performed, and patient medications and                            allergies were reviewed. The patient's tolerance of                            previous anesthesia was also reviewed. The risks                            and benefits of the procedure and the sedation                            options and risks were discussed with the patient.                            All questions were answered, and informed consent                            was obtained. Prior Anticoagulants: The patient                            last took Plavix (clopidogrel) 5 days prior to the                            procedure and has taken no anticoagulant or  antiplatelet agents except for aspirin. ASA Grade                            Assessment: II - A patient with mild systemic                            disease. After reviewing the risks and benefits,                            the patient was deemed in satisfactory condition to                             undergo the procedure.                           After obtaining informed consent, the endoscope was                            passed under direct vision. Throughout the                            procedure, the patient's blood pressure, pulse, and                            oxygen saturations were monitored continuously. The                            [id number] [employer]:[id number]) Olympus therapeutic endoscope                            was introduced through the mouth, and advanced to                            the body of the stomach. The upper GI endoscopy was                            accomplished without difficulty. The patient                            tolerated the procedure. Scope In: Scope Out: Findings:      White nummular lesions were noted in the entire esophagus. Biopsies were       taken with a cold forceps for histology to rule out Candida.      A benign-appearing, intrinsic severe stenosis (4 mm in size) was found       at the anastomosis. This was non-traversed. As he is awaiting surgical       consultation for other potential interventions, decision was made to       replace an AXIOS stent. This was stented with an AXIOS 20 mm x 10 mm. I       could visualize jejunum beyond. I did not dilate the tract (previously       has had GI bleeding post dilation of AXIOS stenting). Impression:               - White nummular lesions  in esophageal mucosa.                            Biopsied to rule out Candida.                           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                            AXIOS 20 mm x 10 mm stent placed. Not dilated today                            with prior history of hemorrhage after dilation. Moderate Sedation:      Not Applicable - Patient had care per Anesthesia. Recommendation:           - The patient will be observed post-procedure,                            until all discharge criteria are met.                           - Discharge  patient to home.                           - Patient has a contact number available for                            emergencies. The signs and symptoms of potential                            delayed complications were discussed with the                            patient. Return to normal activities tomorrow.                            Written discharge instructions were provided to the                            patient.                           - The AXIOS stent will exapnd over the next week.                           - Full liquid diet for 2 days. May advance to soft                            diet as he has done previously thereafter.                           - Continue Aspirin for now. May restart Plavix in  48 hours (2/24). Defer to Cardiology team if                            patient needs both Aspirin and Plavix thereafter or                            just monotherapy.                           - Unfortunately, there are no further endoscopic                            therapies that will lead to durable improvement of                            this stricture/stenosis                           - Repeat EGD in 3-4 months (or sooner) depending on                            the surgical intervention that will be pursued for                            stent removal. - The findings and recommendations                            were discussed with the patient.                           - The findings and recommendations were discussed                            with the patient's family. Procedure Code(s):        --- Professional ---                           [phone], [phone], Esophagogastroduodenoscopy, flexible,                            transoral; with placement of endoscopic stent                            (includes pre- and post-dilation and guide wire                            passage, when performed)                           43239, 59,52,  Esophagogastroduodenoscopy, flexible,                            transoral; with biopsy, single or multiple Diagnosis Code(s):        --- Professional ---  K22.89, Other specified disease of esophagus                           K31.89, Other diseases of stomach and duodenum                           R13.10, Dysphagia, unspecified                           123456, Other complications of other bariatric                            procedure CPT copyright 2022 American Medical Association. All rights reserved. The codes documented in this report are preliminary and upon coder review may  be revised to meet current compliance requirements. Justice Britain, MD 10/16/2022 9:56:21 AM Number of Addenda: 0

## 2022-10-17 ENCOUNTER — Other Ambulatory Visit: Payer: Self-pay

## 2022-10-17 ENCOUNTER — Inpatient Hospital Stay (HOSPITAL_BASED_OUTPATIENT_CLINIC_OR_DEPARTMENT_OTHER): Payer: 59 | Admitting: Physician Assistant

## 2022-10-17 ENCOUNTER — Encounter: Payer: Self-pay | Admitting: Physician Assistant

## 2022-10-17 DIAGNOSIS — Z538 Procedure and treatment not carried out for other reasons: Secondary | ICD-10-CM

## 2022-10-17 DIAGNOSIS — K9189 Other postprocedural complications and disorders of digestive system: Secondary | ICD-10-CM

## 2022-10-17 NOTE — Telephone Encounter (Signed)
EGD scheduled, pt instructed and medications reviewed.  Patient instructions mailed to home.  Patient to call with any questions or concerns.  

## 2022-10-17 NOTE — Telephone Encounter (Signed)
EGD scheduled for 12/25/22 at 730 am at [facility] with GM   Left message on machine to call back

## 2022-10-19 ENCOUNTER — Encounter (HOSPITAL_COMMUNITY): Payer: Self-pay | Admitting: Gastroenterology

## 2022-10-20 ENCOUNTER — Encounter: Payer: Self-pay | Admitting: Pharmacist

## 2022-10-20 ENCOUNTER — Other Ambulatory Visit: Payer: Self-pay

## 2022-10-20 ENCOUNTER — Encounter: Payer: Self-pay | Admitting: Gastroenterology

## 2022-10-20 DIAGNOSIS — D508 Other iron deficiency anemias: Secondary | ICD-10-CM

## 2022-10-20 DIAGNOSIS — D649 Anemia, unspecified: Secondary | ICD-10-CM

## 2022-10-20 LAB — SURGICAL PATHOLOGY

## 2022-10-20 MED ORDER — FLUCONAZOLE 200 MG PO TABS
ORAL_TABLET | ORAL | 0 refills | Status: AC
  Filled 2022-10-20: qty 15, 14d supply, fill #0

## 2022-11-05 ENCOUNTER — Telehealth: Payer: Self-pay | Admitting: *Deleted

## 2022-11-05 DIAGNOSIS — K9189 Other postprocedural complications and disorders of digestive system: Secondary | ICD-10-CM | POA: Diagnosis not present

## 2022-11-05 DIAGNOSIS — Z9884 Bariatric surgery status: Secondary | ICD-10-CM | POA: Diagnosis not present

## 2022-11-05 NOTE — Telephone Encounter (Signed)
   Pre-operative Risk Assessment    Patient Name: Hector Curry  DOB: [birth year] MRN: [id number]      Request for Surgical Clearance    Procedure:   BARIATRIC REVISION OR REVERSAL SURGERY  Date of Surgery:  Clearance TBD                                 Surgeon:  Gurney Maxin, MD Surgeon's Group or Practice Name:  [facility] Phone number:  [phone] Fax number:  [phone]   Type of Clearance Requested:   - Medical  - Pharmacy:  Hold Clopidogrel (Plavix) NOT INDICATED HOW LONG   Type of Anesthesia:  General    Additional requests/questions:    Astrid Divine   11/05/2022, 10:19 AM

## 2022-11-10 NOTE — Telephone Encounter (Signed)
Preoperative team,   I received recommendations from Dr. Dellia Cloud and she added an addendum to her note to clear patient. I will forward her note to the requesting physician. Will you please contact the patient and ensure he understands his instructions as per Dr. Dellia Cloud:  "Plavix can be held 5 to 7 days prior to the procedure.  In the meantime, aspirin 81 mg can be started and can be continued throughout the perioperative procedure.  After the procedure is completed, stop aspirin and start Plavix 75 mg whenever safe from bleeding standpoint."  Thank you!  DW

## 2022-11-10 NOTE — Telephone Encounter (Signed)
I left a detailed message for patient about instructions given from Dr. Dellia Cloud  about holding Plavix for procedure. Please refer to previous note.

## 2022-11-10 NOTE — Telephone Encounter (Signed)
Dr. Dellia Cloud,   [facility] saw this patient on 09/26/2022. Will you please comment on clearance for bariatric revision/reversal surgery. They are requesting patient hold Plavix.    Please route your response to [facility] Preop. I will communicate with requesting office once you have given recommendations.   Thank you!  Mayra Reel, NP

## 2022-11-12 ENCOUNTER — Ambulatory Visit: Payer: Self-pay | Admitting: General Surgery

## 2022-11-12 DIAGNOSIS — K9189 Other postprocedural complications and disorders of digestive system: Secondary | ICD-10-CM

## 2022-11-12 DIAGNOSIS — J449 Chronic obstructive pulmonary disease, unspecified: Secondary | ICD-10-CM | POA: Diagnosis not present

## 2022-11-12 DIAGNOSIS — G4733 Obstructive sleep apnea (adult) (pediatric): Secondary | ICD-10-CM | POA: Diagnosis not present

## 2022-11-14 ENCOUNTER — Telehealth: Payer: Self-pay

## 2022-11-14 NOTE — Telephone Encounter (Signed)
Pts EGD with stent removal moved to [facility] hospital on 11/28/22 at 12noon, pt knows to arrive at 10:30am and be NPO after midnight. Also aware to hold Plavix for 5 days prior to procedure.

## 2022-11-14 NOTE — Telephone Encounter (Signed)
----- Message from Irving Copas., MD sent at 11/13/2022  4:25 PM EDT ----- Regarding: RE: reversal plans LKA, Thanks for reaching out. Unfortunately that is not can work for me to be able to come in and remove the stent.  But I have been able to find a slot where in which I could bring him on the Friday before and remove the AXIOS stent and then he will be able to not have the stent for your surgery on Tuesday. If you think that that is okay, which suspected should be, then I can have my team work on getting him scheduled in reaching out to him.  I tried calling him and his wife but were not able to reach him this afternoon. Let me know if that works out. I am going to have my team go ahead and start working on setting up the date for the procedure. Thanks. GM  Covering RN for [employer], This patient needs an EGD with stent pull.  It is currently scheduled for May but I need to do it on 4/5.  This will be at [facility] for 12:00.  I have already gotten approval from endoscopy staff for this to be done.  We just need to reach out to the patient and let him know about the date.  He will need to hold his Plavix as he has done previously for 5 days and okay for him to be on aspirin. This needs to be completed before his upcoming surgery and April 9. Thanks. GM ----- Message ----- From: Mickeal Skinner, MD Sent: 11/13/2022   3:17 PM EDT To: Irving Copas., MD Subject: RE: reversal plans                             [name],  I've got him scheduled for surgery on 4/9. I asked Hassan Rowan to reach out to your office to coordinate for stent removal. Does that day work?  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 We do not often consider trying to do this on purpose.  I also have not formally done 1 of these procedures before but wonder if that is what is he is talking about. 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 GM ----- Message ----- From: Mickeal Skinner, MD Sent: 11/05/2022  12:33 PM EDT To: Irving Copas., MD Subject: reversal plans                                 [name],  I'm planning to reverse Hector Curry in April (hopefully 4/8 or 4/9). I think having you remove it at the start of the case would be fine and we can work around your schedule for that. Otherwise  it would probably be ok to remove it with 4 days of the surgery and have him go on liquids as tolerated.  [facility]sch[facility]te always takes about endoscopic reversal using an axios stent as an option to avoid surgery. That seems really weird to me and I would be concerned it wouldn't take and then we have even more scar tissue to contend with in a surgical reconstruction, but I wanted to get your thoughts there regardless.  Lurena Joiner

## 2022-11-15 NOTE — Progress Notes (Signed)
COVID Vaccine received:  [x]  No []  Yes Date of any COVID positive Test in last 90 days:  None  PCP - Fulton Reek, MD Cardiologist - Jenkins Rouge, MD Pulmonary- Ottie Glazier, MD at [facility]    Chest x-ray - 04-04-2022  2v Epic EKG -  09-26-2022  Epic Stress Test -  ECHO - 04-15-2021 epic Cardiac Cath - 04-15-2021 LHC/ DES by Dr. Gwenlyn Found  PCR screen: []  Ordered & Completed                      []   No Order but Needs PROFEND                      [x]   N/A for this surgery  Surgery Plan:  []  Ambulatory                            []  Outpatient in bed                            [x]  Admit  Anesthesia:    [x]  General  []  Spinal                           []   Choice []   MAC  Bowel Prep - []  No  []   Yes ______  Pacemaker / ICD device [x]  No []  Yes   Spinal Cord Stimulator:[x]  No []  Yes      History of Sleep Apnea? []  No [x]  Yes   CPAP used?- []  No [x]  Yes    Does the patient monitor blood sugar? []  No [x]  Yes  []  N/A  Patient has: []  Pre-DM   []  DM1  [x]   DM2 Does patient have a [employer] or Dexacom? 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  Activity level: Patient is able to climb a flight of stairs without difficulty; [x]  No CP  [x]  No SOB. Patient can perform ADLs without assistance.   Anesthesia review: CAD (NSTEMI) had LHC- DES x 1, PSVT, Palps, DM2, HTN, OSA-CPAP, COPD  Patient denies shortness of breath, fever, cough and chest pain at PAT appointment.  Patient verbalized understanding and agreement to the Pre-Surgical Instructions that were given to them  at this PAT appointment. Patient was also educated of the need to review these PAT instructions again prior to his surgery.I reviewed the appropriate phone numbers to call if they have any and questions or concerns.

## 2022-11-15 NOTE — Patient Instructions (Signed)
SURGICAL WAITING ROOM VISITATION Patients having surgery or a procedure may have no more than 2 support people in the waiting area - these visitors may rotate in the visitor waiting room.   Due to an increase in RSV and influenza rates and associated hospitalizations, children ages 40 and under may not visit patients in [place]. If the patient needs to stay at the hospital during part of their recovery, the visitor guidelines for inpatient rooms apply.  PRE-OP VISITATION  Pre-op nurse will coordinate an appropriate time for 1 support person to accompany the patient in pre-op.  This support person may not rotate.  This visitor will be contacted when the time is appropriate for the visitor to come back in the pre-op area.  Please refer to the [facility] website for the visitor guidelines for Inpatients (after your surgery is over and you are in a regular room).  You are not required to quarantine at this time prior to your surgery. However, you must do this: Hand Hygiene often Do NOT share personal items Notify your provider if you are in close contact with someone who has COVID or you develop fever 100.4 or greater, new onset of sneezing, cough, sore throat, shortness of breath or body aches.  If you test positive for Covid or have been in contact with anyone that has tested positive in the last 10 days please notify you surgeon.    Your procedure is scheduled on:  Tuesday  December 02, 2022  Report to [facility] Main Entrance: [place] entrance where the [employer] is available.   Report to admitting at: 07:15 AM  +++++Call this number if you have any questions or problems the morning of surgery [phone]  Do not eat food after Midnight the night prior to your surgery/procedure.  After Midnight you may have the following liquids until  07:00 AM DAY OF SURGERY  Clear Liquid Diet Water Black Coffee (sugar ok, NO MILK/CREAM OR CREAMERS)  Tea (sugar ok, NO  MILK/CREAM OR CREAMERS) regular and decaf                             Plain Jell-O  with no fruit (NO RED)                                           Fruit ices (not with fruit pulp, NO RED)                                     Popsicles (NO RED)                                                                  Juice: apple, WHITE grape, WHITE cranberry Sports drinks like Gatorade or Powerade (NO RED)                    The day of surgery:  Drink ONE (1) Pre-Surgery G2 at 07:00 AM the morning of surgery. Drink in one sitting. Do not sip.  This  drink was given to you during your hospital pre-op appointment visit. Nothing else to drink after completing the Pre-Surgery G2 : No candy, chewing gum or throat lozenges.    FOLLOW BOWEL PREP AND ANY ADDITIONAL PRE OP INSTRUCTIONS YOU RECEIVED FROM YOUR SURGEON'S OFFICE!!!   Oral Hygiene is also important to reduce your risk of infection.        Remember - BRUSH YOUR TEETH THE MORNING OF SURGERY WITH YOUR REGULAR TOOTHPASTE  Do NOT smoke after Midnight the night before surgery.  Take ONLY these medicines the morning of surgery with A SIP OF WATER: pantoprazole (Protonix), Propranolol (Inderal)   If You have been diagnosed with Sleep Apnea - Bring CPAP mask and tubing day of surgery. We will provide you with a CPAP machine on the day of your surgery.                   You may not have any metal on your body including jewelry, and body piercing  Do not wear lotions, powders,cologne, or deodorant  Men may shave face and neck.  You may bring a small overnight bag with you on the day of surgery, only pack items that are not valuable. [facility] IS NOT RESPONSIBLE   FOR VALUABLES THAT ARE LOST OR STOLEN.   Do not bring your home medications to the hospital. The Pharmacy will dispense medications listed on your medication list to you during your admission in the Hospital.  Special Instructions: Bring a copy of your healthcare power of attorney  and living will documents the day of surgery, if you wish to have them scanned into your [facility] Records- EPIC  Please read over the following fact sheets you were given: IF YOU HAVE QUESTIONS ABOUT YOUR [place], [place] [phone].   [facility] - Preparing for Surgery Before surgery, you can play an important role.  Because skin is not sterile, your skin needs to be as free of germs as possible.  You can reduce the number of germs on your skin by washing with CHG (chlorahexidine gluconate) soap before surgery.  CHG is an antiseptic cleaner which kills germs and bonds with the skin to continue killing germs even after washing. Please DO NOT use if you have an allergy to CHG or antibacterial soaps.  If your skin becomes reddened/irritated stop using the CHG and inform your nurse when you arrive at Short Stay. Do not shave (including legs and underarms) for at least 48 hours prior to the first CHG shower.  You may shave your face/neck.  Please follow these instructions carefully:  1.  Shower with CHG Soap the night before surgery and the  morning of surgery.  2.  If you choose to wash your hair, wash your hair first as usual with your normal  shampoo.  3.  After you shampoo, rinse your hair and body thoroughly to remove the shampoo.                             4.  Use CHG as you would any other liquid soap.  You can apply chg directly to the skin and wash.  Gently with a scrungie or clean washcloth.  5.  Apply the CHG Soap to your body ONLY FROM THE NECK DOWN.   Do not use on face/ open  Wound or open sores. Avoid contact with eyes, ears mouth and genitals (private parts).                       Wash face,  Genitals (private parts) with your normal soap.             6.  Wash thoroughly, paying special attention to the area where your  surgery  will be performed.  7.  Thoroughly rinse your body with warm water from the neck down.  8.  DO NOT  shower/wash with your normal soap after using and rinsing off the CHG Soap.            9.  Pat yourself dry with a clean towel.            10.  Wear clean pajamas.            11.  Place clean sheets on your bed the night of your first shower and do not  sleep with pets.  ON THE DAY OF SURGERY : Do not apply any lotions/deodorants the morning of surgery.  Please wear clean clothes to the hospital/surgery center.    FAILURE TO FOLLOW THESE INSTRUCTIONS MAY RESULT IN THE CANCELLATION OF YOUR SURGERY  PATIENT SIGNATURE_________________________________  NURSE SIGNATURE__________________________________  ________________________________________________________________________        Hector Curry    An incentive spirometer is a tool that can help keep your lungs clear and active. This tool measures how well you are filling your lungs with each breath. Taking long deep breaths may help reverse or decrease the chance of developing breathing (pulmonary) problems (especially infection) following: A long period of time when you are unable to move or be active. BEFORE THE PROCEDURE  If the spirometer includes an indicator to show your best effort, your nurse or respiratory therapist will set it to a desired goal. If possible, sit up straight or lean slightly forward. Try not to slouch. Hold the incentive spirometer in an upright position. INSTRUCTIONS FOR USE  Sit on the edge of your bed if possible, or sit up as far as you can in bed or on a chair. Hold the incentive spirometer in an upright position. Breathe out normally. Place the mouthpiece in your mouth and seal your lips tightly around it. Breathe in slowly and as deeply as possible, raising the piston or the ball toward the top of the column. Hold your breath for 3-5 seconds or for as long as possible. Allow the piston or ball to fall to the bottom of the column. Remove the mouthpiece from your mouth and breathe out  normally. Rest for a few seconds and repeat Steps 1 through 7 at least 10 times every 1-2 hours when you are awake. Take your time and take a few normal breaths between deep breaths. The spirometer may include an indicator to show your best effort. Use the indicator as a goal to work toward during each repetition. After each set of 10 deep breaths, practice coughing to be sure your lungs are clear. If you have an incision (the cut made at the time of surgery), support your incision when coughing by placing a pillow or rolled up towels firmly against it. Once you are able to get out of bed, walk around indoors and cough well. You may stop using the incentive spirometer when instructed by your caregiver.  RISKS AND COMPLICATIONS Take your time so you do not get dizzy or light-headed. If you  are in pain, you may need to take or ask for pain medication before doing incentive spirometry. It is harder to take a deep breath if you are having pain. AFTER USE Rest and breathe slowly and easily. It can be helpful to keep track of a log of your progress. Your caregiver can provide you with a simple table to help with this. If you are using the spirometer at home, follow these instructions: Hector Curry IF:  You are having difficultly using the spirometer. You have trouble using the spirometer as often as instructed. Your pain medication is not giving enough relief while using the spirometer. You develop fever of 100.5 F (38.1 C) or higher.                                                                                                    SEEK IMMEDIATE MEDICAL CARE IF:  You cough up bloody sputum that had not been present before. You develop fever of 102 F (38.9 C) or greater. You develop worsening pain at or near the incision site. MAKE SURE YOU:  Understand these instructions. Will watch your condition. Will get help right away if you are not doing well or get worse. Document Released:  12/22/2006 Document Revised: 11/03/2011 Document Reviewed: 02/22/2007 Hector Curry Patient Information 2014 [place], [place].

## 2022-11-19 ENCOUNTER — Other Ambulatory Visit: Payer: Self-pay

## 2022-11-19 ENCOUNTER — Encounter (HOSPITAL_COMMUNITY): Payer: Self-pay

## 2022-11-19 ENCOUNTER — Encounter (HOSPITAL_COMMUNITY)
Admission: RE | Admit: 2022-11-19 | Discharge: 2022-11-19 | Disposition: A | Discharge status: Home / Self Care | Payer: 59 | Source: Ambulatory Visit | Attending: General Surgery | Admitting: General Surgery

## 2022-11-19 VITALS — BP 121/76 | HR 80 | Temp 97.9°F | Resp 18 | Ht 74.0 in | Wt 228.0 lb

## 2022-11-19 DIAGNOSIS — Z794 Long term (current) use of insulin: Secondary | ICD-10-CM | POA: Insufficient documentation

## 2022-11-19 DIAGNOSIS — K9189 Other postprocedural complications and disorders of digestive system: Secondary | ICD-10-CM | POA: Diagnosis not present

## 2022-11-19 DIAGNOSIS — Z01812 Encounter for preprocedural laboratory examination: Secondary | ICD-10-CM | POA: Insufficient documentation

## 2022-11-19 DIAGNOSIS — E119 Type 2 diabetes mellitus without complications: Secondary | ICD-10-CM

## 2022-11-19 HISTORY — DX: Acute myocardial infarction, unspecified: I21.9

## 2022-11-19 LAB — CBC WITH DIFFERENTIAL/PLATELET
Abs Immature Granulocytes: 0.05 10*3/uL (ref 0.00–0.07)
Basophils Absolute: 0 10*3/uL (ref 0.0–0.1)
Basophils Relative: 1 %
Eosinophils Absolute: 0.2 10*3/uL (ref 0.0–0.5)
Eosinophils Relative: 2 %
HCT: 46.4 % (ref 39.0–52.0)
Hemoglobin: 15.1 g/dL (ref 13.0–17.0)
Immature Granulocytes: 1 %
Lymphocytes Relative: 20 %
Lymphs Abs: 1.7 10*3/uL (ref 0.7–4.0)
MCH: 29.7 pg (ref 26.0–34.0)
MCHC: 32.5 g/dL (ref 30.0–36.0)
MCV: 91.2 fL (ref 80.0–100.0)
Monocytes Absolute: 0.7 10*3/uL (ref 0.1–1.0)
Monocytes Relative: 8 %
Neutro Abs: 6.1 10*3/uL (ref 1.7–7.7)
Neutrophils Relative %: 68 %
Platelets: 190 10*3/uL (ref 150–400)
RBC: 5.09 MIL/uL (ref 4.22–5.81)
RDW: 13.1 % (ref 11.5–15.5)
WBC: 8.8 10*3/uL (ref 4.0–10.5)
nRBC: 0 % (ref 0.0–0.2)

## 2022-11-19 LAB — COMPREHENSIVE METABOLIC PANEL
ALT: 37 U/L (ref 0–44)
AST: 23 U/L (ref 15–41)
Albumin: 3.9 g/dL (ref 3.5–5.0)
Alkaline Phosphatase: 105 U/L (ref 38–126)
Anion gap: 9 (ref 5–15)
BUN: 9 mg/dL (ref 6–20)
CO2: 26 mmol/L (ref 22–32)
Calcium: 8.8 mg/dL — ABNORMAL LOW (ref 8.9–10.3)
Chloride: 104 mmol/L (ref 98–111)
Creatinine, Ser: 0.55 mg/dL — ABNORMAL LOW (ref 0.61–1.24)
GFR, Estimated: >60 mL/min (ref >60)
Glucose, Bld: 126 mg/dL — ABNORMAL HIGH (ref 70–99)
Potassium: 3.5 mmol/L (ref 3.5–5.1)
Sodium: 139 mmol/L (ref 135–145)
Total Bilirubin: 1 mg/dL (ref 0.3–1.2)
Total Protein: 6.8 g/dL (ref 6.5–8.1)

## 2022-11-19 LAB — TYPE AND SCREEN
ABO/RH(D): B POS
Antibody Screen: NEGATIVE

## 2022-11-19 LAB — GLUCOSE, CAPILLARY: Glucose-Capillary: 135 mg/dL — ABNORMAL HIGH (ref 70–99)

## 2022-11-24 ENCOUNTER — Other Ambulatory Visit (HOSPITAL_COMMUNITY): Payer: Self-pay

## 2022-11-24 NOTE — Progress Notes (Signed)
Case: [id number] Date/Time: 11/28/22 1200   Procedure: ESOPHAGOGASTRODUODENOSCOPY (EGD) WITH PROPOFOL WITH AXIOS STENT REMOVAL   Anesthesia type: Monitor Anesthesia Care   Pre-op diagnosis: axois stent remocal   Location: [facility]   Surgeons: Mansouraty, Telford Nab., MD       DISCUSSION: Hector Curry is a 62 year old male with hx of obesity s/p lap band c/b esophageal stricture, NSTEMI, CAD s/p PCI, PSVT, atrial tachycardia, dCHF, former smoker (quit 30 years ago), OSA on BiPAP, COPD, insulin dependent DM.  VS: BP 121/76 Comment: right arm sitting  Pulse 80   Temp 36.6 C (Oral)   Resp 18   Ht 6\' 2"  (1.88 m)   Wt 103.4 kg   SpO2 96%   BMI 29.27 kg/m   PROVIDERS: PCP: [name], MD Cardiologist:  Jenkins Rouge, MD  Pulmonologist: Dr. Zetta Bills  #Hx of NSTEMI  #CAD s/p PCI #dCHF #PSVT #Cardiology Pre-operative clearance from 09/26/22: -Patient denied any angina or DOE. "He is at an intermediate risk for any peri-operative cardiac complications based on his CAD Hx. Patient can hold Plavix 5-7 days prior to the procedure. In the meantime, can start taking aspirin 81 mg once daily and does not have to be held for the procedure. After the procedure, he can stop aspirin and resume taking Plavix 75 mg once daily whenever safe from a bleeding standpoint."  #Dilated aortic root -Cardiology recommending f/u CTA   #OSA on Bipap #COPD -Stable per Pulmonology -Does not use inhalers  #IDDM -HgA1c 6.7 on 10/03/22  LABS: Labs reviewed: Acceptable for surgery.   Labs Reviewed  COMPREHENSIVE METABOLIC PANEL - Abnormal; Notable for the following components:      Result Value   Glucose, Bld 126 (*)    Creatinine, Ser 0.55 (*)    Calcium 8.8 (*)    All other components within normal limits  GLUCOSE, CAPILLARY - Abnormal; Notable for the following components:   Glucose-Capillary 135 (*)    All other components within normal limits  CBC WITH  DIFFERENTIAL/PLATELET  TYPE AND SCREEN     IMAGES: n/a   EKG 09/26/22: NSR and RBBB  CV:   Echo 04/15/21: 1. Left ventricular ejection fraction, by estimation, is 50 to 55%. The  left ventricle has low normal function. The left ventricle demonstrates  regional wall motion abnormalities. Apical hypokinesis. There is moderate  asymmetric left ventricular  hypertrophy of the basal-septal segment. Left ventricular diastolic  parameters are indeterminate.   2. Right ventricular systolic function is normal. The right ventricular  size is mildly enlarged. Tricuspid regurgitation signal is inadequate for  assessing PA pressure.   3. The mitral valve is normal in structure. No evidence of mitral valve  regurgitation. No evidence of mitral stenosis.   4. The aortic valve is tricuspid. Aortic valve regurgitation is not  visualized. Mild aortic valve sclerosis is present, with no evidence of  aortic valve stenosis.   5. Aortic dilatation noted. There is dilatation of the ascending aorta,  measuring 44 mm.  from 2022 showed normal LVEF 50-55%, moderate LVH and mild dilatation of the ascending aorta 44 mm  LHC 04/15/21:   Prox LAD lesion is 80% stenosed.   1st Diag lesion is 90% stenosed.   A drug-eluting stent was successfully placed.   Post intervention, there is a 0% residual stenosis.  Past Medical History:  Diagnosis Date   Allergic rhinitis    Atrial tachycardia    CAD (coronary artery  disease)    a. 05/08/2015 Cath: no significant CAD, LVEF nl-->Med; b. 07/2017 MV: attenuation artifact, no ischemia, EF 65%-->Low risk; c. 03/2021 NSTEMI/PCI: LM nl, LAD 80p (3.0x18 Onyx Frontier DES), D1 90 (too small for PCI), LCX nl, RCA nl.   Diabetes mellitus without complication (HCC)    Diastolic dysfunction    a. 04/2015 Echo: EF 60-65%; b. 05/2016 Echo: EF 60-65%, GrI DD; c. 07/2017 Echo: EF 55-60%, Ao root 3mm; d. 03/2019 Echo: EF 55-60%, Ao root/Asc Ao 64mm; e. 03/2021 Echo: EF 50-55%, apical HK,  mod asymm basal-septal hypertrophy. Nl RV fxn. Asc Ao 70mm.   Dilated aortic root ([place])    a. 07/2017 Echo: 62mm Ao root - mildly dil; b. 03/2019 Echo: Ao root 56mm; c. 03/2021 Echo: Asc Ao 33mm.   Diverticulosis 10/03/2014   Dysrhythmia    Esophageal stricture    a.  In setting of lap band June 2022-stricture at the [place] anastomosis requiring gastrostomy tube; b. 05/2021 s/p esoph stenting (74mm); c. 07/2021 s/p esoph stenting (27mm).   Family history of premature CAD    a. father passed from MI at 8   GERD (gastroesophageal reflux disease)    GIB (gastrointestinal bleeding)    a. 07/2021 following esoph stenting.   Hemorrhoids    a. internal hemorrhoids s/p surgery 1999   History of kidney stones    History of tobacco abuse    Hyperlipidemia    Hyperplastic colon polyp 10/03/2014   a. x 2    Hypertension    Inflammatory arthritis    a. CCP antibodies & x-rays negative. Rheumatoid factor 14, felt to be crystaline over RA or psoriatic   Iron deficiency anemia 02/05/2022   Morbid obesity ([place])    a. s/p LAP-BAND - complicated by esoph stricture.   Myocardial infarction [facility])    NSTEMI   OSA (obstructive sleep apnea)    a. on CPAP   Osteoarthritis    PSVT (paroxysmal supraventricular tachycardia)    a. 48 hr Holter 04/2015: NSR w/ rare PVC, short runs of narrow complex tachycardiac, possible atrial tach, longest run 7 beats, PACs noted (2% of all beats 3600 total) they did not seem to correlate w/ significant arrythmia; b. 08/2017 Event monitor: no significant arrhythmias.    Past Surgical History:  Procedure Laterality Date   BALLOON DILATION N/A 08/14/2021   Procedure: BALLOON DILATION;  Surgeon: Mansouraty, Telford Nab., MD;  Location: [facility];  Service: Gastroenterology;  Laterality: N/A;   BALLOON DILATION N/A 02/20/2022   Procedure: BALLOON DILATION;  Surgeon: Milus Banister, MD;  Location: [facility];  Service: Gastroenterology;  Laterality: N/A;   BALLOON DILATION N/A  03/27/2022   Procedure: BALLOON DILATION;  Surgeon: Rush Landmark Telford Nab., MD;  Location: [place];  Service: Gastroenterology;  Laterality: N/A;   BARIATRIC SURGERY  01/2021   lap band    BILIARY STENT PLACEMENT N/A 08/14/2021   Procedure: AXIOS STENT PLACEMENT;  Surgeon: Irving Copas., MD;  Location: [facility];  Service: Gastroenterology;  Laterality: N/A;   BILIARY STENT PLACEMENT N/A 10/16/2022   Procedure: STENT PLACEMENT;  Surgeon: Irving Copas., MD;  Location: [facility];  Service: Gastroenterology;  Laterality: N/A;   BIOPSY  07/08/2021   Procedure: BIOPSY;  Surgeon: Rush Landmark Telford Nab., MD;  Location: [facility];  Service: Gastroenterology;;   BIOPSY  08/14/2021   Procedure: BIOPSY;  Surgeon: Irving Copas., MD;  [facility];  Service: Gastroenterology;;   BIOPSY  10/16/2022   Procedure: BIOPSY;  Surgeon: Irving Copas., MD;  Location: [facility];  Service: Gastroenterology;;   CARDIAC CATHETERIZATION N/A 05/08/2015   Procedure: Left Heart Cath and Coronary Angiography;  Surgeon: Jettie Booze, MD;  Location: [place] [facility];  Service: Cardiovascular;  Laterality: N/A;   CARPAL TUNNEL RELEASE Bilateral    CHOLECYSTECTOMY     COLONOSCOPY  06/27/2005   COLONOSCOPY  10/03/2014   CORONARY STENT INTERVENTION N/A 04/15/2021   Procedure: CORONARY STENT INTERVENTION;  Surgeon: Lorretta Harp, MD;  Location: [place] [facility];  Service: Cardiovascular;  Laterality: N/A;   DUODENAL STENT PLACEMENT N/A 11/18/2021   Procedure: DUODENAL STENT PLACEMENT;  Surgeon: Rush Landmark Telford Nab., MD;  Location: [facility];  Service: Gastroenterology;  Laterality: N/A;  axios placed at [place] anastamosis   DUODENAL STENT PLACEMENT  03/27/2022   Procedure: GASTRIC STENT PLACEMENT;  Surgeon: Rush Landmark Telford Nab., MD;  Location: [place];  Service: Gastroenterology;;   ESOPHAGEAL DILATION  05/27/2021   Procedure:  ESOPHAGEAL DILATION;  Surgeon: Irving Copas., MD;  Location: [facility];  Service: Gastroenterology;;   ESOPHAGEAL DILATION  01/23/2022   Procedure: ESOPHAGEAL DILATION;  Surgeon: Irving Copas., MD;  Location: [place];  Service: Gastroenterology;;   ESOPHAGEAL STENT PLACEMENT N/A 05/27/2021   Procedure: ESOPHAGEAL STENT PLACEMENT;  Surgeon: Irving Copas., MD;  Location: [facility];  Service: Gastroenterology;  Laterality: N/A;   ESOPHAGOGASTRODUODENOSCOPY  06/27/2005   ESOPHAGOGASTRODUODENOSCOPY N/A 03/19/2021   Procedure: ESOPHAGOGASTRODUODENOSCOPY (EGD);  Surgeon: Kieth Brightly, Arta Bruce, MD;  Location: [facility];  Service: General;  Laterality: N/A;   ESOPHAGOGASTRODUODENOSCOPY N/A 08/16/2021   Procedure: ESOPHAGOGASTRODUODENOSCOPY (EGD);  Surgeon: Irving Copas., MD;  Location: [place];  Service: Gastroenterology;  Laterality: N/A;   ESOPHAGOGASTRODUODENOSCOPY (EGD) WITH PROPOFOL N/A 05/27/2021   Procedure: ESOPHAGOGASTRODUODENOSCOPY (EGD) WITH PROPOFOL;  Surgeon: Rush Landmark Telford Nab., MD;  [facility];  Service: Gastroenterology;  Laterality: N/A;   ESOPHAGOGASTRODUODENOSCOPY (EGD) WITH PROPOFOL N/A 07/08/2021   Procedure: ESOPHAGOGASTRODUODENOSCOPY (EGD) WITH PROPOFOL;  Surgeon: Rush Landmark Telford Nab., MD;  [facility];  Service: Gastroenterology;  Laterality: N/A;  fluoro   ESOPHAGOGASTRODUODENOSCOPY (EGD) WITH PROPOFOL N/A 08/14/2021   Procedure: ESOPHAGOGASTRODUODENOSCOPY (EGD) WITH PROPOFOL;  Surgeon: Rush Landmark Telford Nab., MD;  [facility];  Service: Gastroenterology;  Laterality: N/A;  fluoro Axios stent (20 mm)   ESOPHAGOGASTRODUODENOSCOPY (EGD) WITH PROPOFOL N/A 10/16/2021   Procedure: ESOPHAGOGASTRODUODENOSCOPY (EGD) WITH PROPOFOL;  Surgeon: Rush Landmark Telford Nab., MD;  [facility];  Service: Gastroenterology;  Laterality: N/A;  axios stent pull fluoro   ESOPHAGOGASTRODUODENOSCOPY  (EGD) WITH PROPOFOL N/A 11/18/2021   Procedure: ESOPHAGOGASTRODUODENOSCOPY (EGD) WITH PROPOFOL;  Surgeon: Rush Landmark Telford Nab., MD;  [facility];  Service: Gastroenterology;  Laterality: N/A;   ESOPHAGOGASTRODUODENOSCOPY (EGD) WITH PROPOFOL N/A 01/23/2022   Procedure: ESOPHAGOGASTRODUODENOSCOPY (EGD) WITH PROPOFOL;  Surgeon: Rush Landmark Telford Nab., MD;  Location: [place];  Service: Gastroenterology;  Laterality: N/A;   ESOPHAGOGASTRODUODENOSCOPY (EGD) WITH PROPOFOL N/A 02/20/2022   Procedure: ESOPHAGOGASTRODUODENOSCOPY (EGD) WITH PROPOFOL;  Surgeon: Milus Banister, MD;  [facility];  Service: Gastroenterology;  Laterality: N/A;   ESOPHAGOGASTRODUODENOSCOPY (EGD) WITH PROPOFOL N/A 03/27/2022   Procedure: ESOPHAGOGASTRODUODENOSCOPY (EGD) WITH PROPOFOL;  Surgeon: Rush Landmark Telford Nab., MD;  Location: [place];  Service: Gastroenterology;  Laterality: N/A;   ESOPHAGOGASTRODUODENOSCOPY (EGD) WITH PROPOFOL N/A 08/14/2022   Procedure: ESOPHAGOGASTRODUODENOSCOPY (EGD) WITH PROPOFOL;  Surgeon: Rush Landmark Telford Nab., MD;  [facility];  Service: Gastroenterology;  Laterality: N/A;   ESOPHAGOGASTRODUODENOSCOPY (EGD) WITH PROPOFOL N/A 10/16/2022   Procedure: ESOPHAGOGASTRODUODENOSCOPY (EGD)  WITH PROPOFOL;  Surgeon: Mansouraty, Telford Nab., MD;  Location: [facility];  Service: Gastroenterology;  Laterality: N/A;   FLEXIBLE SIGMOIDOSCOPY N/A 08/16/2021   Procedure: FLEXIBLE SIGMOIDOSCOPY;  Surgeon: Rush Landmark Telford Nab., MD;  Location: [place];  Service: Gastroenterology;  Laterality: N/A;   FOREIGN BODY REMOVAL  02/20/2022   Procedure: FOREIGN BODY REMOVAL;  Surgeon: Milus Banister, MD;  [facility];  Service: Gastroenterology;;   FOREIGN BODY REMOVAL ESOPHAGEAL  03/27/2022   Procedure: REMOVAL FOREIGN BODY;  Surgeon: Rush Landmark Telford Nab., MD;  Location: [place];  Service: Gastroenterology;;   GASTRIC ROUX-EN-Y N/A 02/18/2021    Procedure: LAPAROSCOPIC ROUX-EN-Y GASTRIC BYPASS WITH UPPER ENDOSCOPY,;  Surgeon: Kieth Brightly Arta Bruce, MD;  [facility];  Service: General;  Laterality: N/A;   GASTROINTESTINAL STENT REMOVAL  01/23/2022   Procedure: Theora Master STENT REMOVAL;  Surgeon: Irving Copas., MD;  Location: [place];  Service: Gastroenterology;;   HEMORRHOID SURGERY  08/25/1997   IR [place] TUBE CHANGE  03/27/2021   LAPAROSCOPIC INSERTION GASTROSTOMY TUBE N/A 03/20/2021   Procedure: LAPAROSCOPIC INSERTION GASTROSTOMY TUBE;  Surgeon: Mickeal Skinner, MD;  [facility];  Service: General;  Laterality: N/A;   LEFT HEART CATH AND CORONARY ANGIOGRAPHY N/A 04/15/2021   Procedure: LEFT HEART CATH AND CORONARY ANGIOGRAPHY;  Surgeon: Lorretta Harp, MD;  Location: [place] [facility];  Service: Cardiovascular;  Laterality: N/A;   MOUTH SURGERY     removed area which was benign   STENT REMOVAL  07/08/2021   Procedure: STENT REMOVAL;  Surgeon: Irving Copas., MD;  Location: [facility];  Service: Gastroenterology;;   Lavell Islam REMOVAL  10/16/2021   Procedure: STENT REMOVAL;  Surgeon: Irving Copas., MD;  Location: [facility];  Service: Gastroenterology;;   Lavell Islam REMOVAL  08/14/2022   Procedure: STENT REMOVAL;  Surgeon: Irving Copas., MD;  [facility];  Service: Gastroenterology;;   Lehman Prom INJECTION  01/23/2022   Procedure: STEROID INJECTION;  Surgeon: Irving Copas., MD;  Location: [place];  Service: Gastroenterology;;   Lia Foyer INJECTION  11/18/2021   Procedure: SUBMUCOSAL INJECTION;  Surgeon: Irving Copas., MD;  [facility];  Service: Gastroenterology;;   Lia Foyer INJECTION  08/14/2022   Procedure: SUBMUCOSAL INJECTION;  Surgeon: Irving Copas., MD;  Location: [facility];  Service: Gastroenterology;;   TONSILLECTOMY     UPPER GI ENDOSCOPY N/A 03/20/2021   Procedure: UPPER GI ENDOSCOPY;  Surgeon: Kieth Brightly Arta Bruce, MD;  [facility];  Service: General;  Laterality: N/A;    MEDICATIONS:  aspirin EC 81 MG tablet   atorvastatin (LIPITOR) 10 MG tablet   clopidogrel (PLAVIX) 75 MG tablet   ferrous sulfate 325 (65 FE) MG tablet   fluticasone (FLONASE) 50 MCG/ACT nasal spray   loratadine (CLARITIN) 10 MG tablet   mometasone (ELOCON) 0.1 % lotion   Multiple Vitamin (MULTI-VITAMIN DAILY PO)   nitroGLYCERIN (NITROSTAT) 0.4 MG SL tablet   pantoprazole (PROTONIX) 40 MG tablet   Potassium Chloride ER 20 MEQ TBCR   PRESCRIPTION MEDICATION   propranolol (INDERAL) 20 MG tablet   Propylene Glycol (SYSTANE BALANCE OP)   Pseudoeph-Doxylamine-DM-APAP (NYQUIL PO)   sucralfate (CARAFATE) 1 g tablet   VITAMIN E PO   No current facility-administered medications for this encounter.

## 2022-11-26 ENCOUNTER — Other Ambulatory Visit: Payer: Self-pay

## 2022-11-26 ENCOUNTER — Other Ambulatory Visit (HOSPITAL_COMMUNITY): Payer: Self-pay

## 2022-11-26 MED ORDER — FREESTYLE LIBRE 14 DAY SENSOR MISC
1 refills | Status: DC
  Filled 2022-11-26: qty 6, 84d supply, fill #0
  Filled 2022-11-28: qty 1, 14d supply, fill #0
  Filled 2022-11-28: qty 2, 28d supply, fill #0
  Filled 2022-12-20: qty 2, 28d supply, fill #1
  Filled 2023-01-16: qty 2, 28d supply, fill #2
  Filled 2023-02-11 – 2023-02-24 (×4): qty 2, 28d supply, fill #3
  Filled 2023-03-27 (×2): qty 2, 28d supply, fill #4
  Filled 2023-05-11: qty 2, 28d supply, fill #5
  Filled 2023-07-10: qty 1, 14d supply, fill #6

## 2022-11-26 MED ORDER — INSULIN LISPRO (1 UNIT DIAL) 100 UNIT/ML (KWIKPEN)
PEN_INJECTOR | SUBCUTANEOUS | 1 refills | Status: DC
  Filled 2022-11-26: qty 15, 30d supply, fill #0

## 2022-11-28 ENCOUNTER — Encounter (HOSPITAL_COMMUNITY)
Admission: RE | Disposition: A | Discharge status: Home / Self Care | Payer: Self-pay | Source: Ambulatory Visit | Attending: Gastroenterology

## 2022-11-28 ENCOUNTER — Ambulatory Visit (HOSPITAL_COMMUNITY): Payer: 59 | Admitting: Emergency Medicine

## 2022-11-28 ENCOUNTER — Encounter (HOSPITAL_COMMUNITY): Payer: Self-pay | Admitting: Hematology

## 2022-11-28 ENCOUNTER — Other Ambulatory Visit (HOSPITAL_COMMUNITY): Payer: Self-pay

## 2022-11-28 ENCOUNTER — Other Ambulatory Visit: Payer: Self-pay

## 2022-11-28 ENCOUNTER — Ambulatory Visit (HOSPITAL_COMMUNITY): Payer: 59

## 2022-11-28 ENCOUNTER — Ambulatory Visit (HOSPITAL_BASED_OUTPATIENT_CLINIC_OR_DEPARTMENT_OTHER): Payer: 59 | Admitting: Anesthesiology

## 2022-11-28 ENCOUNTER — Encounter (HOSPITAL_COMMUNITY): Payer: Self-pay | Admitting: Gastroenterology

## 2022-11-28 ENCOUNTER — Ambulatory Visit (HOSPITAL_COMMUNITY)
Admission: RE | Admit: 2022-11-28 | Discharge: 2022-11-28 | Disposition: A | Discharge status: Home / Self Care | Payer: 59 | Source: Ambulatory Visit | Attending: Gastroenterology | Admitting: Gastroenterology

## 2022-11-28 DIAGNOSIS — K9189 Other postprocedural complications and disorders of digestive system: Secondary | ICD-10-CM

## 2022-11-28 DIAGNOSIS — Z978 Presence of other specified devices: Secondary | ICD-10-CM

## 2022-11-28 DIAGNOSIS — I2510 Atherosclerotic heart disease of native coronary artery without angina pectoris: Secondary | ICD-10-CM | POA: Diagnosis not present

## 2022-11-28 DIAGNOSIS — Z9884 Bariatric surgery status: Secondary | ICD-10-CM | POA: Diagnosis not present

## 2022-11-28 DIAGNOSIS — E785 Hyperlipidemia, unspecified: Secondary | ICD-10-CM | POA: Diagnosis not present

## 2022-11-28 DIAGNOSIS — G4733 Obstructive sleep apnea (adult) (pediatric): Secondary | ICD-10-CM | POA: Diagnosis not present

## 2022-11-28 DIAGNOSIS — T85898A Other specified complication of other internal prosthetic devices, implants and grafts, initial encounter: Secondary | ICD-10-CM | POA: Insufficient documentation

## 2022-11-28 DIAGNOSIS — K219 Gastro-esophageal reflux disease without esophagitis: Secondary | ICD-10-CM | POA: Diagnosis not present

## 2022-11-28 DIAGNOSIS — I252 Old myocardial infarction: Secondary | ICD-10-CM

## 2022-11-28 DIAGNOSIS — Z794 Long term (current) use of insulin: Secondary | ICD-10-CM | POA: Insufficient documentation

## 2022-11-28 DIAGNOSIS — M1990 Unspecified osteoarthritis, unspecified site: Secondary | ICD-10-CM | POA: Diagnosis not present

## 2022-11-28 DIAGNOSIS — Z87891 Personal history of nicotine dependence: Secondary | ICD-10-CM

## 2022-11-28 DIAGNOSIS — T182XXA Foreign body in stomach, initial encounter: Secondary | ICD-10-CM | POA: Insufficient documentation

## 2022-11-28 DIAGNOSIS — E119 Type 2 diabetes mellitus without complications: Secondary | ICD-10-CM | POA: Diagnosis not present

## 2022-11-28 DIAGNOSIS — W449XXA Unspecified foreign body entering into or through a natural orifice, initial encounter: Secondary | ICD-10-CM | POA: Insufficient documentation

## 2022-11-28 DIAGNOSIS — Z79899 Other long term (current) drug therapy: Secondary | ICD-10-CM | POA: Insufficient documentation

## 2022-11-28 DIAGNOSIS — I10 Essential (primary) hypertension: Secondary | ICD-10-CM | POA: Insufficient documentation

## 2022-11-28 DIAGNOSIS — Z955 Presence of coronary angioplasty implant and graft: Secondary | ICD-10-CM | POA: Diagnosis not present

## 2022-11-28 HISTORY — PX: ESOPHAGOGASTRODUODENOSCOPY (EGD) WITH PROPOFOL: SHX5813

## 2022-11-28 HISTORY — PX: STENT REMOVAL: SHX6421

## 2022-11-28 LAB — GLUCOSE, CAPILLARY: Glucose-Capillary: 116 mg/dL — ABNORMAL HIGH (ref 70–99)

## 2022-11-28 SURGERY — ESOPHAGOGASTRODUODENOSCOPY (EGD) WITH PROPOFOL
Anesthesia: Monitor Anesthesia Care

## 2022-11-28 MED ORDER — SUCRALFATE 1 G PO TABS
1.0000 g | ORAL_TABLET | Freq: Two times a day (BID) | ORAL | 1 refills | Status: DC
  Filled 2022-11-28: qty 120, 60d supply, fill #0

## 2022-11-28 MED ORDER — LIDOCAINE 2% (20 MG/ML) 5 ML SYRINGE
INTRAMUSCULAR | Status: DC | PRN
  Administered 2022-11-28: 60 mg via INTRAVENOUS

## 2022-11-28 MED ORDER — PROPOFOL 500 MG/50ML IV EMUL
INTRAVENOUS | Status: DC | PRN
  Administered 2022-11-28: 125 ug/kg/min via INTRAVENOUS

## 2022-11-28 MED ORDER — PROPOFOL 10 MG/ML IV BOLUS
INTRAVENOUS | Status: DC | PRN
  Administered 2022-11-28: 10 mg via INTRAVENOUS
  Administered 2022-11-28: 20 mg via INTRAVENOUS
  Administered 2022-11-28: 10 mg via INTRAVENOUS
  Administered 2022-11-28: 20 mg via INTRAVENOUS
  Administered 2022-11-28: 10 mg via INTRAVENOUS

## 2022-11-28 MED ORDER — CLOPIDOGREL BISULFATE 75 MG PO TABS: 75.0000 mg | ORAL_TABLET | Freq: Every day | ORAL | 3 refills | Status: DC

## 2022-11-28 MED ORDER — PANTOPRAZOLE SODIUM 40 MG PO TBEC
40.0000 mg | DELAYED_RELEASE_TABLET | Freq: Two times a day (BID) | ORAL | 12 refills | Status: DC
  Filled 2022-11-28: qty 60, 30d supply, fill #0
  Filled 2022-12-22: qty 180, 90d supply, fill #0

## 2022-11-28 MED ORDER — SODIUM CHLORIDE 0.9 % IV SOLN: INTRAVENOUS | Status: DC

## 2022-11-28 MED ORDER — LACTATED RINGERS IV SOLN: INTRAVENOUS | Status: DC

## 2022-11-28 SURGICAL SUPPLY — 15 items

## 2022-11-28 NOTE — H&P (Signed)
GASTROENTEROLOGY PROCEDURE H&P NOTE   Primary Care Physician: Marguarite ArbourSparks, Jeffrey D, MD  HPI: Hector Curry is a 62 y.o. male who presents for EGD for removal of AXIOS stent for GJ anastomosis stricture.  Past Medical History:  Diagnosis Date   Allergic rhinitis    Atrial tachycardia    CAD (coronary artery disease)    a. 05/08/2015 Cath: no significant CAD, LVEF nl-->Med; b. 07/2017 MV: attenuation artifact, no ischemia, EF 65%-->Low risk; c. 03/2021 NSTEMI/PCI: LM nl, LAD 80p (3.0x18 Onyx Frontier DES), D1 90 (too small for PCI), LCX nl, RCA nl.   Diabetes mellitus without complication    Diastolic dysfunction    a. 04/2015 Echo: EF 60-65%; b. 05/2016 Echo: EF 60-65%, GrI DD; c. 07/2017 Echo: EF 55-60%, Ao root 42mm; d. 03/2019 Echo: EF 55-60%, Ao root/Asc Ao 42mm; e. 03/2021 Echo: EF 50-55%, apical HK, mod asymm basal-septal hypertrophy. Nl RV fxn. Asc Ao 44mm.   Dilated aortic root    a. 07/2017 Echo: 42mm Ao root - mildly dil; b. 03/2019 Echo: Ao root 42mm; c. 03/2021 Echo: Asc Ao 44mm.   Diverticulosis 10/03/2014   Dysrhythmia    Esophageal stricture    a.  In setting of lap band June 2022-stricture at the GJ anastomosis requiring gastrostomy tube; b. 05/2021 s/p esoph stenting (15mm); c. 07/2021 s/p esoph stenting (20mm).   Family history of premature CAD    a. father passed from MI at 7249   GERD (gastroesophageal reflux disease)    GIB (gastrointestinal bleeding)    a. 07/2021 following esoph stenting.   Hemorrhoids    a. internal hemorrhoids s/p surgery 1999   History of kidney stones    History of tobacco abuse    Hyperlipidemia    Hyperplastic colon polyp 10/03/2014   a. x 2    Hypertension    Inflammatory arthritis    a. CCP antibodies & x-rays negative. Rheumatoid factor 14, felt to be crystaline over RA or psoriatic   Iron deficiency anemia 02/05/2022   Morbid obesity    a. s/p LAP-BAND - complicated by esoph stricture.   Myocardial infarction    NSTEMI   OSA  (obstructive sleep apnea)    a. on CPAP   Osteoarthritis    PSVT (paroxysmal supraventricular tachycardia)    a. 48 hr Holter 04/2015: NSR w/ rare PVC, short runs of narrow complex tachycardiac, possible atrial tach, longest run 7 beats, PACs noted (2% of all beats 3600 total) they did not seem to correlate w/ significant arrythmia; b. 08/2017 Event monitor: no significant arrhythmias.   Past Surgical History:  Procedure Laterality Date   BALLOON DILATION N/A 08/14/2021   Procedure: BALLOON DILATION;  Surgeon: Mansouraty, Netty StarringGabriel Jr., MD;  [facility];  Service: Gastroenterology;  Laterality: N/A;   BALLOON DILATION N/A 02/20/2022   Procedure: BALLOON DILATION;  Surgeon: Rachael FeeJacobs, Daniel P, MD;  [facility];  Service: Gastroenterology;  Laterality: N/A;   BALLOON DILATION N/A 03/27/2022   Procedure: BALLOON DILATION;  Surgeon: Meridee ScoreMansouraty, Netty StarringGabriel Jr., MD;  [facility];  Service: Gastroenterology;  Laterality: N/A;   BARIATRIC SURGERY  01/2021   lap band    BILIARY STENT PLACEMENT N/A 08/14/2021   Procedure: AXIOS STENT PLACEMENT;  Surgeon: Lemar LoftyMansouraty, Railyn House Jr., MD;  [facility];  Service: Gastroenterology;  Laterality: N/A;   BILIARY STENT PLACEMENT N/A 10/16/2022   Procedure: STENT PLACEMENT;  Surgeon: Lemar LoftyMansouraty, Tameika Heckmann Jr., MD;  [facility];  Service: Gastroenterology;  Laterality: N/A;  BIOPSY  07/08/2021   Procedure: BIOPSY;  Surgeon: Meridee Score Netty Starring., MD;  [facility];  Service: Gastroenterology;;   BIOPSY  08/14/2021   Procedure: BIOPSY;  Surgeon: Lemar Lofty., MD;  [facility];  Service: Gastroenterology;;   BIOPSY  10/16/2022   Procedure: BIOPSY;  Surgeon: Lemar Lofty., MD;  [facility];  Service: Gastroenterology;;   CARDIAC CATHETERIZATION N/A 05/08/2015   Procedure: Left Heart Cath and Coronary Angiography;  Surgeon: Corky Crafts, MD;  Location: [facility]  INVASIVE CV LAB;  Service: Cardiovascular;  Laterality: N/A;   CARPAL TUNNEL RELEASE Bilateral    CHOLECYSTECTOMY     COLONOSCOPY  06/27/2005   COLONOSCOPY  10/03/2014   CORONARY STENT INTERVENTION N/A 04/15/2021   Procedure: CORONARY STENT INTERVENTION;  Surgeon: Runell Gess, MD;  [facility]: [facility];  Service: Cardiovascular;  Laterality: N/A;   DUODENAL STENT PLACEMENT N/A 11/18/2021   Procedure: DUODENAL STENT PLACEMENT;  Surgeon: Meridee Score Netty Starring., MD;  [facility];  Service: Gastroenterology;  Laterality: N/A;  axios placed at GJ anastamosis   DUODENAL STENT PLACEMENT  03/27/2022   Procedure: GASTRIC STENT PLACEMENT;  Surgeon: Meridee Score Netty Starring., MD;  Location: [facility]OSCOPY;  Service: Gastroenterology;;   ESOPHAGEAL DILATION  05/27/2021   Procedure: ESOPHAGEAL DILATION;  Surgeon: Lemar Lofty., MD;  [facility];  Service: Gastroenterology;;   ESOPHAGEAL DILATION  01/23/2022   Procedure: ESOPHAGEAL DILATION;  Surgeon: Lemar Lofty., MD;  Location: [facility] ENDOSCOPY;  Service: Gastroenterology;;   ESOPHAGEAL STENT PLACEMENT N/A 05/27/2021   Procedure: ESOPHAGEAL STENT PLACEMENT;  Surgeon: Lemar Lofty., MD;  [facility];  Service: Gastroenterology;  Laterality: N/A;   ESOPHAGOGASTRODUODENOSCOPY  06/27/2005   ESOPHAGOGASTRODUODENOSCOPY N/A 03/19/2021   Procedure: ESOPHAGOGASTRODUODENOSCOPY (EGD);  Surgeon: Sheliah Hatch, De Blanch, MD;  [facility];  Service: General;  Laterality: N/A;   ESOPHAGOGASTRODUODENOSCOPY N/A 08/16/2021   Procedure: ESOPHAGOGASTRODUODENOSCOPY (EGD);  Surgeon: Lemar Lofty., MD;  Location: [facility] ENDOSCOPY;  Service: Gastroenterology;  Laterality: N/A;   ESOPHAGOGASTRODUODENOSCOPY (EGD) WITH PROPOFOL N/A 05/27/2021   Procedure: ESOPHAGOGASTRODUODENOSCOPY (EGD) WITH PROPOFOL;  Surgeon: Meridee Score Netty Starring., MD;  [facility];  Service: Gastroenterology;   Laterality: N/A;   ESOPHAGOGASTRODUODENOSCOPY (EGD) WITH PROPOFOL N/A 07/08/2021   Procedure: ESOPHAGOGASTRODUODENOSCOPY (EGD) WITH PROPOFOL;  Surgeon: Meridee Score Netty Starring., MD;  [facility];  Service: Gastroenterology;  Laterality: N/A;  fluoro   ESOPHAGOGASTRODUODENOSCOPY (EGD) WITH PROPOFOL N/A 08/14/2021   Procedure: ESOPHAGOGASTRODUODENOSCOPY (EGD) WITH PROPOFOL;  Surgeon: Meridee Score Netty Starring., MD;  [facility];  Service: Gastroenterology;  Laterality: N/A;  fluoro Axios stent (20 mm)   ESOPHAGOGASTRODUODENOSCOPY (EGD) WITH PROPOFOL N/A 10/16/2021   Procedure: ESOPHAGOGASTRODUODENOSCOPY (EGD) WITH PROPOFOL;  Surgeon: Meridee Score Netty Starring., MD;  [facility];  Service: Gastroenterology;  Laterality: N/A;  axios stent pull fluoro   ESOPHAGOGASTRODUODENOSCOPY (EGD) WITH PROPOFOL N/A 11/18/2021   Procedure: ESOPHAGOGASTRODUODENOSCOPY (EGD) WITH PROPOFOL;  Surgeon: Meridee Score Netty Starring., MD;  [facility];  Service: Gastroenterology;  Laterality: N/A;   ESOPHAGOGASTRODUODENOSCOPY (EGD) WITH PROPOFOL N/A 01/23/2022   Procedure: ESOPHAGOGASTRODUODENOSCOPY (EGD) WITH PROPOFOL;  Surgeon: Meridee Score Netty Starring., MD;  Location[facility] ENDOSCOPY;  Service: Gastroenterology;  Laterality: N/A;   ESOPHAGOGASTRODUODENOSCOPY (EGD) WITH PROPOFOL N/A 02/20/2022   Procedure: ESOPHAGOGASTRODUODENOSCOPY (EGD) WITH PROPOFOL;  Surgeon: Rachael Fee, MD;  [facility];  Service: Gastroenterology;  Laterality: N/A;   ESOPHAGOGASTRODUODENOSCOPY (EGD) WITH PROPOFOL N/A 03/27/2022   Procedure: ESOPHAGOGASTRODUODENOSCOPY (EGD) WITH PROPOFOL;  Surgeon: Meridee Score Netty Starring., MD;  [facility];  Service: Gastroenterology;  Laterality: N/A;   ESOPHAGOGASTRODUODENOSCOPY (EGD) WITH PROPOFOL N/A 08/14/2022   Procedure: ESOPHAGOGASTRODUODENOSCOPY (EGD) WITH PROPOFOL;  Surgeon: Meridee ScoreMansouraty, Netty StarringGabriel Jr., MD;  [facility];  Service: Gastroenterology;   Laterality: N/A;   ESOPHAGOGASTRODUODENOSCOPY (EGD) WITH PROPOFOL N/A 10/16/2022   Procedure: ESOPHAGOGASTRODUODENOSCOPY (EGD) WITH PROPOFOL;  Surgeon: Meridee ScoreMansouraty, Netty StarringGabriel Jr., MD;  [facility];  Service: Gastroenterology;  Laterality: N/A;   FLEXIBLE SIGMOIDOSCOPY N/A 08/16/2021   Procedure: FLEXIBLE SIGMOIDOSCOPY;  Surgeon: Meridee ScoreMansouraty, Netty StarringGabriel Jr., MD;  [facility];  Service: Gastroenterology;  Laterality: N/A;   FOREIGN BODY REMOVAL  02/20/2022   Procedure: FOREIGN BODY REMOVAL;  Surgeon: Rachael FeeJacobs, Daniel P, MD;  [facility];  Service: Gastroenterology;;   FOREIGN BODY REMOVAL ESOPHAGEAL  03/27/2022   Procedure: REMOVAL FOREIGN BODY;  Surgeon: Meridee ScoreMansouraty, Netty StarringGabriel Jr., MD;  [facility];  Service: Gastroenterology;;   GASTRIC ROUX-EN-Y N/A 02/18/2021   Procedure: LAPAROSCOPIC ROUX-EN-Y GASTRIC BYPASS WITH UPPER ENDOSCOPY,;  Surgeon: Sheliah HatchKinsinger, De BlanchLuke Aaron, MD;  [facility]: [facility];  Service: General;  Laterality: N/A;   GASTROINTESTINAL STENT REMOVAL  01/23/2022   Procedure: Daine GipAXIOS STENT REMOVAL;  Surgeon: Lemar LoftyMansouraty, Manveer Gomes Jr., MD;  [facility];  Service: Gastroenterology;;   HEMORRHOID SURGERY  08/25/1997   IR GJ TUBE CHANGE  03/27/2021   LAPAROSCOPIC INSERTION GASTROSTOMY TUBE N/A 03/20/2021   Procedure: LAPAROSCOPIC INSERTION GASTROSTOMY TUBE;  Surgeon: Rodman PickleKinsinger, Luke Aaron, MD;  [facility];  Service: General;  Laterality: N/A;   LEFT HEART CATH AND CORONARY ANGIOGRAPHY N/A 04/15/2021   Procedure: LEFT HEART CATH AND CORONARY ANGIOGRAPHY;  Surgeon: Runell GessBerry, Jonathan J, MD;  [facility] CV LAB;  Service: Cardiovascular;  Laterality: N/A;   MOUTH SURGERY     removed area which was benign   STENT REMOVAL  07/08/2021   Procedure: STENT REMOVAL;  Surgeon: Lemar LoftyMansouraty, Carlia Bomkamp Jr., MD;  [facility];  Service: Gastroenterology;;   Francine GravenSTENT REMOVAL  10/16/2021   Procedure: STENT REMOVAL;  Surgeon: Lemar LoftyMansouraty, Melford Tullier Jr.,  MD;  [facility];  Service: Gastroenterology;;   Francine GravenSTENT REMOVAL  08/14/2022   Procedure: STENT REMOVAL;  Surgeon: Lemar LoftyMansouraty, Quetzalli Clos Jr., MD;  [facility];  Service: Gastroenterology;;   STERIOD INJECTION  01/23/2022   Procedure: STEROID INJECTION;  Surgeon: Lemar LoftyMansouraty, Zaylin Pistilli Jr., MD;  [facility];  Service: Gastroenterology;;   SUBMUCOSAL INJECTION  11/18/2021   Procedure: SUBMUCOSAL INJECTION;  Surgeon: Lemar LoftyMansouraty, Rosana Farnell Jr., MD;  [facility];  Service: Gastroenterology;;   SUBMUCOSAL INJECTION  08/14/2022   Procedure: SUBMUCOSAL INJECTION;  Surgeon: Lemar LoftyMansouraty, Jonita Hirota Jr., MD;  [facility];  Service: Gastroenterology;;   TONSILLECTOMY     UPPER GI ENDOSCOPY N/A 03/20/2021   Procedure: UPPER GI ENDOSCOPY;  Surgeon: Kinsinger, De BlanchLuke Aaron, MD;  Location: [facility];  Service: General;  Laterality: N/A;   Current Facility-Administered Medications  Medication Dose Route Frequency Provider Last Rate Last Admin   0.9 %  sodium chloride infusion   Intravenous Continuous Mansouraty, Netty StarringGabriel Jr., MD       lactated ringers infusion   Intravenous Continuous Mansouraty, Netty StarringGabriel Jr., MD 10 mL/hr at 11/28/22 1121 New Bag at 11/28/22 1121    Current Facility-Administered Medications:    0.9 %  sodium chloride infusion, , Intravenous, Continuous, Mansouraty, Netty StarringGabriel Jr., MD   lactated ringers infusion, , Intravenous, Continuous, Mansouraty, Netty StarringGabriel Jr., MD, Last Rate: 10 mL/hr at 11/28/22 1121, New Bag at 11/28/22 1121 Allergies  Allergen Reactions   Other Itching and Other (See Comments)  Cats Wheezing, runny nose/eyes, congestion    Family History  Problem Relation Age of Onset   Hypertension Mother    Diabetes Mother    Heart attack Father 45   CAD Father    Hypertension Sister    Diabetes Other    Hypertension Other    Heart disease Other    Colon cancer Neg Hx    Esophageal cancer Neg Hx    Pancreatic cancer Neg Hx    Stomach  cancer Neg Hx    Liver disease Neg Hx    Inflammatory bowel disease Neg Hx    Rectal cancer Neg Hx    Social History   Socioeconomic History   Marital status: Married    Spouse name: Not on file   Number of children: 3   Years of education: Not on file   Highest education level: Not on file  Occupational History   Occupation: IT IT trainer  Tobacco Use   Smoking status: Former    Packs/day: 1.00    Years: 15.00    Additional pack years: 0.00    Total pack years: 15.00    Types: Cigarettes    Quit date: 08/05/1989    Years since quitting: 33.3   Smokeless tobacco: Never  Vaping Use   Vaping Use: Never used  Substance and Sexual Activity   Alcohol use: Yes    Comment: occ   Drug use: Never   Sexual activity: Not Currently  Other Topics Concern   Not on file  Social History Narrative   Not on file   Social Determinants of Health   Financial Resource Strain: Not on file  Food Insecurity: Not on file  Transportation Needs: Not on file  Physical Activity: Not on file  Stress: Not on file  Social Connections: Not on file  Intimate Partner Violence: Not on file    Physical Exam: Today's Vitals   11/28/22 1105  BP: 138/84  Pulse: 62  Resp: 16  Temp: 97.6 F (36.4 C)  TempSrc: Temporal  SpO2: 94%  Weight: 103.4 kg  Height: 6\' 2"  (1.88 m)  PainSc: 0-No pain   Body mass index is 29.27 kg/m. GEN: NAD EYE: Sclerae anicteric ENT: MMM CV: Non-tachycardic GI: Soft, NT/ND NEURO:  Alert & Oriented x 3  Lab Results: No results for input(s): "WBC", "HGB", "HCT", "PLT" in the last 72 hours. BMET No results for input(s): "NA", "K", "CL", "CO2", "GLUCOSE", "BUN", "CREATININE", "CALCIUM" in the last 72 hours. LFT No results for input(s): "PROT", "ALBUMIN", "AST", "ALT", "ALKPHOS", "BILITOT", "BILIDIR", "IBILI" in the last 72 hours. PT/INR No results for input(s): "LABPROT", "INR" in the last 72 hours.   Impression / Plan: This is a 62 y.o.male who presents for  EGD for removal of AXIOS stent for GJ anastomosis stricture.  The risks and benefits of endoscopic evaluation/treatment were discussed with the patient and/or family; these include but are not limited to the risk of perforation, infection, bleeding, missed lesions, lack of diagnosis, severe illness requiring hospitalization, as well as anesthesia and sedation related illnesses.  The patient's history has been reviewed, patient examined, no change in status, and deemed stable for procedure.  The patient and/or family is agreeable to proceed.    Corliss Parish, MD [facility] Advanced [facility]oscopy Office # [phone]

## 2022-11-28 NOTE — Discharge Instructions (Signed)

## 2022-11-28 NOTE — Anesthesia Postprocedure Evaluation (Signed)
Anesthesia Post Note  Patient: Hector Curry  Procedure(s) Performed: ESOPHAGOGASTRODUODENOSCOPY (EGD) WITH PROPOFOL WITH AXIOS STENT REMOVAL STENT REMOVAL     Patient location during evaluation: Endoscopy Anesthesia Type: MAC Level of consciousness: awake Pain management: pain level controlled Vital Signs Assessment: post-procedure vital signs reviewed and stable Respiratory status: spontaneous breathing, nonlabored ventilation and respiratory function stable Cardiovascular status: blood pressure returned to baseline and stable Postop Assessment: no apparent nausea or vomiting Anesthetic complications: no   No notable events documented.  Last Vitals:  Vitals:   11/28/22 1300 11/28/22 1305  BP: 114/71   Pulse: 65 70  Resp: 18 14  Temp:  36.4 C  SpO2: 95% 96%    Last Pain:  Vitals:   11/28/22 1305  TempSrc:   PainSc: 0-No pain                 Nikai Quest P Macarthur Lorusso

## 2022-11-28 NOTE — Transfer of Care (Signed)
Immediate Anesthesia Transfer of Care Note  Patient: Hector Curry  Procedure(s) Performed: ESOPHAGOGASTRODUODENOSCOPY (EGD) WITH PROPOFOL WITH AXIOS STENT REMOVAL STENT REMOVAL  Patient Location: PACU  Anesthesia Type:MAC  Level of Consciousness: awake, alert , and oriented  Airway & Oxygen Therapy: Patient Spontanous Breathing  Post-op Assessment: Report given to RN and Post -op Vital signs reviewed and stable  Post vital signs: Reviewed and stable  Last Vitals:  Vitals Value Taken Time  BP 111/70 11/28/22 1255  Temp    Pulse 65 11/28/22 1259  Resp 18 11/28/22 1259  SpO2 95 % 11/28/22 1259  Vitals shown include unvalidated device data.  Last Pain:  Vitals:   11/28/22 1105  TempSrc: Temporal  PainSc: 0-No pain         Complications: No notable events documented.

## 2022-11-28 NOTE — Anesthesia Procedure Notes (Signed)
Procedure Name: MAC Date/Time: 11/28/2022 12:32 PM  Performed by: Marena Chancy, CRNAPre-anesthesia Checklist: Patient identified, Emergency Drugs available, Suction available, Patient being monitored and Timeout performed Patient Re-evaluated:Patient Re-evaluated prior to induction Oxygen Delivery Method: Simple face mask

## 2022-11-28 NOTE — Anesthesia Preprocedure Evaluation (Signed)
Anesthesia Evaluation  Patient identified by MRN, date of birth, ID band Patient awake    Reviewed: Allergy & Precautions, NPO status , Patient's Chart, lab work & pertinent test results  Airway Mallampati: II  TM Distance: >3 FB Neck ROM: Full    Dental no notable dental hx.    Pulmonary sleep apnea and Continuous Positive Airway Pressure Ventilation , former smoker   Pulmonary exam normal        Cardiovascular hypertension, + CAD, + Past MI and + Cardiac Stents  Normal cardiovascular exam+ dysrhythmias      Neuro/Psych negative neurological ROS  negative psych ROS   GI/Hepatic Neg liver ROS,GERD  Medicated and Controlled,,  Endo/Other  diabetes, Insulin Dependent    Renal/GU Renal disease     Musculoskeletal  (+) Arthritis ,    Abdominal   Peds  Hematology  (+) Blood dyscrasia (Plavix)   Anesthesia Other Findings axois stent remocal  Reproductive/Obstetrics                             Anesthesia Physical Anesthesia Plan  ASA: 3  Anesthesia Plan: MAC   Post-op Pain Management:    Induction: Intravenous  PONV Risk Score and Plan: 1 and Propofol infusion and Treatment may vary due to age or medical condition  Airway Management Planned: Nasal Cannula  Additional Equipment:   Intra-op Plan:   Post-operative Plan:   Informed Consent: I have reviewed the patients History and Physical, chart, labs and discussed the procedure including the risks, benefits and alternatives for the proposed anesthesia with the patient or authorized representative who has indicated his/her understanding and acceptance.     Dental advisory given  Plan Discussed with: CRNA  Anesthesia Plan Comments:        Anesthesia Quick Evaluation

## 2022-11-28 NOTE — Op Note (Signed)
[facility] Patient Name: Hector Curry Procedure Date : 11/28/2022 MRN: [id number] Attending MD: Corliss Parish , MD, [id number] Date of Birth: March 22, 1961 CSN: [id number] Age: 62 Admit Type: Outpatient Procedure:                Upper GI endoscopy Indications:              Failure to respond to medical treatment, Foreign                            body in the stomach, Follow-up of post-surgical                            anastomotic stenosis Providers:                Corliss Parish, MD, Fransisca Connors, Harrington Challenger, Technician Referring MD:             De Blanch Kinsinger MD, MD, Duane Lope. Judithann Sheen, MD Medicines:                Monitored Anesthesia Care Complications:            No immediate complications. Estimated Blood Loss:     Estimated blood loss was minimal. Procedure:                Pre-Anesthesia Assessment:                           - Prior to the procedure, a History and Physical                            was performed, and patient medications and                            allergies were reviewed. The patient's tolerance of                            previous anesthesia was also reviewed. The risks                            and benefits of the procedure and the sedation                            options and risks were discussed with the patient.                            All questions were answered, and informed consent                            was obtained. Prior Anticoagulants: The patient                            last took Plavix (clopidogrel) 5 days prior to the  procedure and has taken no anticoagulant or                            antiplatelet agents except for aspirin. ASA Grade                            Assessment: III - A patient with severe systemic                            disease. After reviewing the risks and benefits,                            the patient was deemed in  satisfactory condition to                            undergo the procedure.                           After obtaining informed consent, the endoscope was                            passed under direct vision. Throughout the                            procedure, the patient's blood pressure, pulse, and                            oxygen saturations were monitored continuously. The                            [id number]-[id number] ([id number]) Therapeutic endoscope was                            introduced through the mouth, and advanced to the                            jejunum. The upper GI endoscopy was accomplished                            without difficulty. The patient tolerated the                            procedure. Scope In: Scope Out: Findings:      No gross lesions were noted in the entire esophagus.      A metal AXIOS stent was found at the anastomosis. Stent removal was       accomplished with a Raptor grasping device.      Staples were found at the anastomosis under the previously placed stent       (these had not been visualized previously). Removal was accomplished       with a Raptor grasping device of 2 of these staples.      Normal mucosa was found in the examined jejunum. Impression:               - No gross lesions in the  entire esophagus.                           - Pre-existing anastomois stent, removed.                           - Staples were found at the anastomosis. Removal                            was successful of 2 of the staples.                           - Normal mucosa was found in the visualized jejunum. Recommendation:           - The patient will be observed post-procedure,                            until all discharge criteria are met.                           - Discharge patient to home.                           - Patient has a contact number available for                            emergencies. The signs and symptoms of potential                             delayed complications were discussed with the                            patient. Return to normal activities tomorrow.                            Written discharge instructions were provided to the                            patient.                           - Resume previous diet.                           - Patient may continue aspirin for now. If the                            patient was going to need to restart Plavix then he                            could restart that tomorrow. However as he has an                            upcoming surgery next week I think he will continue  to hold that (as per instructions from his surgery                            team).                           - Restart PPI twice daily.                           - Carafate slurry (crush/dissolve tablet in 15 mL                            of fluid) and take twice daily.                           - Observe patient's clinical course.                           - Followup as needed after his Gastric Bypass                            revision.                           - The findings and recommendations were discussed                            with the patient.                           - The findings and recommendations were discussed                            with the patient's family. Procedure Code(s):        --- Professional ---                           [phone] [id number], Esophagogastroduodenoscopy, flexible,                            transoral; with removal of foreign body(s) Diagnosis Code(s):        --- Professional ---                           Z97.8, Presence of other specified devices                           T18.2XXA, Foreign body in stomach, initial encounter                           K91.89, Other postprocedural complications and                            disorders of digestive system CPT copyright 2022 American [employer]. All rights reserved. The codes documented  in this report are preliminary and upon coder review may  be revised to meet current compliance requirements. Vicente SereneGabriel  Mansouraty, MD 11/28/2022 1:03:25 PM Number of Addenda: 0

## 2022-12-01 ENCOUNTER — Encounter (HOSPITAL_COMMUNITY): Payer: Self-pay | Admitting: Gastroenterology

## 2022-12-02 ENCOUNTER — Other Ambulatory Visit: Payer: Self-pay

## 2022-12-02 ENCOUNTER — Inpatient Hospital Stay (HOSPITAL_COMMUNITY): Payer: 59 | Admitting: Emergency Medicine

## 2022-12-02 ENCOUNTER — Inpatient Hospital Stay (HOSPITAL_COMMUNITY)
Admission: RE | Admit: 2022-12-02 | Discharge: 2022-12-06 | DRG: 327 | Disposition: A | Discharge status: Home / Self Care | Payer: 59 | Attending: General Surgery | Admitting: General Surgery

## 2022-12-02 ENCOUNTER — Encounter (HOSPITAL_COMMUNITY): Payer: Self-pay | Admitting: General Surgery

## 2022-12-02 ENCOUNTER — Encounter (HOSPITAL_COMMUNITY)
Admission: RE | Disposition: A | Discharge status: Home / Self Care | Payer: Self-pay | Source: Home / Self Care | Attending: General Surgery

## 2022-12-02 DIAGNOSIS — E785 Hyperlipidemia, unspecified: Secondary | ICD-10-CM | POA: Diagnosis present

## 2022-12-02 DIAGNOSIS — Z8249 Family history of ischemic heart disease and other diseases of the circulatory system: Secondary | ICD-10-CM

## 2022-12-02 DIAGNOSIS — Z7902 Long term (current) use of antithrombotics/antiplatelets: Secondary | ICD-10-CM | POA: Diagnosis not present

## 2022-12-02 DIAGNOSIS — G4733 Obstructive sleep apnea (adult) (pediatric): Secondary | ICD-10-CM | POA: Diagnosis present

## 2022-12-02 DIAGNOSIS — K219 Gastro-esophageal reflux disease without esophagitis: Secondary | ICD-10-CM | POA: Diagnosis present

## 2022-12-02 DIAGNOSIS — Z87442 Personal history of urinary calculi: Secondary | ICD-10-CM | POA: Diagnosis not present

## 2022-12-02 DIAGNOSIS — K222 Esophageal obstruction: Principal | ICD-10-CM

## 2022-12-02 DIAGNOSIS — Z9884 Bariatric surgery status: Secondary | ICD-10-CM

## 2022-12-02 DIAGNOSIS — Z87891 Personal history of nicotine dependence: Secondary | ICD-10-CM

## 2022-12-02 DIAGNOSIS — D509 Iron deficiency anemia, unspecified: Secondary | ICD-10-CM | POA: Diagnosis not present

## 2022-12-02 DIAGNOSIS — K9189 Other postprocedural complications and disorders of digestive system: Principal | ICD-10-CM

## 2022-12-02 DIAGNOSIS — R509 Fever, unspecified: Secondary | ICD-10-CM | POA: Diagnosis not present

## 2022-12-02 DIAGNOSIS — Z833 Family history of diabetes mellitus: Secondary | ICD-10-CM

## 2022-12-02 DIAGNOSIS — I10 Essential (primary) hypertension: Secondary | ICD-10-CM | POA: Diagnosis not present

## 2022-12-02 DIAGNOSIS — K9589 Other complications of other bariatric procedure: Principal | ICD-10-CM | POA: Diagnosis present

## 2022-12-02 DIAGNOSIS — Z7982 Long term (current) use of aspirin: Secondary | ICD-10-CM | POA: Diagnosis not present

## 2022-12-02 DIAGNOSIS — I2510 Atherosclerotic heart disease of native coronary artery without angina pectoris: Secondary | ICD-10-CM | POA: Diagnosis not present

## 2022-12-02 DIAGNOSIS — I252 Old myocardial infarction: Secondary | ICD-10-CM

## 2022-12-02 DIAGNOSIS — Z79899 Other long term (current) drug therapy: Secondary | ICD-10-CM | POA: Diagnosis not present

## 2022-12-02 DIAGNOSIS — K660 Peritoneal adhesions (postprocedural) (postinfection): Secondary | ICD-10-CM

## 2022-12-02 DIAGNOSIS — Z794 Long term (current) use of insulin: Secondary | ICD-10-CM | POA: Diagnosis not present

## 2022-12-02 DIAGNOSIS — Z955 Presence of coronary angioplasty implant and graft: Secondary | ICD-10-CM

## 2022-12-02 DIAGNOSIS — Z5331 Laparoscopic surgical procedure converted to open procedure: Secondary | ICD-10-CM

## 2022-12-02 DIAGNOSIS — K5669 Other partial intestinal obstruction: Secondary | ICD-10-CM | POA: Diagnosis not present

## 2022-12-02 DIAGNOSIS — Z8719 Personal history of other diseases of the digestive system: Secondary | ICD-10-CM | POA: Diagnosis not present

## 2022-12-02 DIAGNOSIS — K3189 Other diseases of stomach and duodenum: Secondary | ICD-10-CM | POA: Diagnosis not present

## 2022-12-02 DIAGNOSIS — E119 Type 2 diabetes mellitus without complications: Secondary | ICD-10-CM | POA: Diagnosis not present

## 2022-12-02 DIAGNOSIS — K56699 Other intestinal obstruction unspecified as to partial versus complete obstruction: Secondary | ICD-10-CM | POA: Diagnosis present

## 2022-12-02 HISTORY — PX: GASTROSTOMY: SHX5249

## 2022-12-02 HISTORY — PX: GASTRIC ROUX-EN-Y: SHX5262

## 2022-12-02 LAB — CREATININE, SERUM
Creatinine, Ser: 0.82 mg/dL (ref 0.61–1.24)
GFR, Estimated: >60 mL/min (ref >60)

## 2022-12-02 LAB — GLUCOSE, CAPILLARY
Glucose-Capillary: 153 mg/dL — ABNORMAL HIGH (ref 70–99)
Glucose-Capillary: 171 mg/dL — ABNORMAL HIGH (ref 70–99)

## 2022-12-02 LAB — HEMOGLOBIN AND HEMATOCRIT, BLOOD
HCT: 45.2 % (ref 39.0–52.0)
Hemoglobin: 15 g/dL (ref 13.0–17.0)

## 2022-12-02 LAB — CBC
HCT: 47.7 % (ref 39.0–52.0)
Hemoglobin: 15.7 g/dL (ref 13.0–17.0)
MCH: 30.5 pg (ref 26.0–34.0)
MCHC: 32.9 g/dL (ref 30.0–36.0)
MCV: 92.8 fL (ref 80.0–100.0)
Platelets: 159 10*3/uL (ref 150–400)
RBC: 5.14 MIL/uL (ref 4.22–5.81)
RDW: 13 % (ref 11.5–15.5)
WBC: 13.9 10*3/uL — ABNORMAL HIGH (ref 4.0–10.5)
nRBC: 0 % (ref 0.0–0.2)

## 2022-12-02 SURGERY — LAPAROSCOPIC ROUX-EN-Y GASTRIC BYPASS WITH UPPER ENDOSCOPY
Anesthesia: General | Site: Abdomen

## 2022-12-02 MED ORDER — SUGAMMADEX SODIUM 200 MG/2ML IV SOLN
INTRAVENOUS | Status: DC | PRN
  Administered 2022-12-02: 200 mg via INTRAVENOUS

## 2022-12-02 MED ORDER — DEXTROSE-NACL 5-0.45 % IV SOLN: INTRAVENOUS | Status: DC

## 2022-12-02 MED ORDER — DEXAMETHASONE SODIUM PHOSPHATE 10 MG/ML IJ SOLN
INTRAMUSCULAR | Status: AC
  Filled 2022-12-02: qty 1

## 2022-12-02 MED ORDER — MIDAZOLAM HCL 5 MG/5ML IJ SOLN
INTRAMUSCULAR | Status: DC | PRN
  Administered 2022-12-02: 2 mg via INTRAVENOUS

## 2022-12-02 MED ORDER — ROCURONIUM BROMIDE 10 MG/ML (PF) SYRINGE
PREFILLED_SYRINGE | INTRAVENOUS | Status: AC
  Filled 2022-12-02: qty 10

## 2022-12-02 MED ORDER — OXYCODONE HCL 5 MG/5ML PO SOLN: 5.0000 mg | Freq: Once | ORAL | Status: DC | PRN

## 2022-12-02 MED ORDER — CHLORHEXIDINE GLUCONATE CLOTH 2 % EX PADS: 6.0000 | MEDICATED_PAD | Freq: Once | CUTANEOUS | Status: DC

## 2022-12-02 MED ORDER — METOPROLOL TARTRATE 25 MG PO TABS
25.0000 mg | ORAL_TABLET | Freq: Once | ORAL | Status: AC
  Administered 2022-12-02: 25 mg via ORAL

## 2022-12-02 MED ORDER — PROPOFOL 10 MG/ML IV BOLUS
INTRAVENOUS | Status: DC | PRN
  Administered 2022-12-02: 120 mg via INTRAVENOUS

## 2022-12-02 MED ORDER — APREPITANT 40 MG PO CAPS
40.0000 mg | ORAL_CAPSULE | ORAL | Status: AC
  Administered 2022-12-02: 40 mg via ORAL
  Filled 2022-12-02: qty 1

## 2022-12-02 MED ORDER — ORAL CARE MOUTH RINSE: 15.0000 mL | Freq: Once | OROMUCOSAL | Status: AC

## 2022-12-02 MED ORDER — BUPIVACAINE LIPOSOME 1.3 % IJ SUSP
INTRAMUSCULAR | Status: AC
  Filled 2022-12-02: qty 20

## 2022-12-02 MED ORDER — LIDOCAINE HCL (PF) 2 % IJ SOLN
INTRAMUSCULAR | Status: AC
  Filled 2022-12-02: qty 5

## 2022-12-02 MED ORDER — ACETAMINOPHEN 500 MG PO TABS
1000.0000 mg | ORAL_TABLET | ORAL | Status: AC
  Administered 2022-12-02: 1000 mg via ORAL
  Filled 2022-12-02: qty 2

## 2022-12-02 MED ORDER — FENTANYL CITRATE (PF) 100 MCG/2ML IJ SOLN
INTRAMUSCULAR | Status: AC
  Filled 2022-12-02: qty 2

## 2022-12-02 MED ORDER — MIDAZOLAM HCL 2 MG/2ML IJ SOLN
INTRAMUSCULAR | Status: AC
  Filled 2022-12-02: qty 2

## 2022-12-02 MED ORDER — METOPROLOL TARTRATE 25 MG PO TABS
ORAL_TABLET | ORAL | Status: AC
  Filled 2022-12-02: qty 1

## 2022-12-02 MED ORDER — PHENYLEPHRINE HCL-NACL 20-0.9 MG/250ML-% IV SOLN
INTRAVENOUS | Status: DC | PRN
  Administered 2022-12-02: 10 ug/min via INTRAVENOUS

## 2022-12-02 MED ORDER — ALBUMIN HUMAN 5 % IV SOLN
INTRAVENOUS | Status: AC
  Filled 2022-12-02: qty 250

## 2022-12-02 MED ORDER — PROMETHAZINE HCL 25 MG/ML IJ SOLN: 6.2500 mg | INTRAMUSCULAR | Status: DC | PRN

## 2022-12-02 MED ORDER — 0.9 % SODIUM CHLORIDE (POUR BTL) OPTIME
TOPICAL | Status: DC | PRN
  Administered 2022-12-02 (×2): 1000 mL

## 2022-12-02 MED ORDER — HEPARIN SODIUM (PORCINE) 5000 UNIT/ML IJ SOLN
5000.0000 [IU] | INTRAMUSCULAR | Status: AC
  Administered 2022-12-02: 5000 [IU] via SUBCUTANEOUS
  Filled 2022-12-02: qty 1

## 2022-12-02 MED ORDER — MEPERIDINE HCL 50 MG/ML IJ SOLN: 6.2500 mg | INTRAMUSCULAR | Status: DC | PRN

## 2022-12-02 MED ORDER — ROCURONIUM BROMIDE 10 MG/ML (PF) SYRINGE
PREFILLED_SYRINGE | INTRAVENOUS | Status: DC | PRN
  Administered 2022-12-02: 60 mg via INTRAVENOUS
  Administered 2022-12-02 (×2): 20 mg via INTRAVENOUS
  Administered 2022-12-02: 40 mg via INTRAVENOUS
  Administered 2022-12-02: 20 mg via INTRAVENOUS
  Administered 2022-12-02: 40 mg via INTRAVENOUS

## 2022-12-02 MED ORDER — HYDROMORPHONE HCL 1 MG/ML IJ SOLN
INTRAMUSCULAR | Status: AC
  Filled 2022-12-02: qty 1

## 2022-12-02 MED ORDER — LIDOCAINE 2% (20 MG/ML) 5 ML SYRINGE
INTRAMUSCULAR | Status: DC | PRN
  Administered 2022-12-02: 1.5 mg/kg/h via INTRAVENOUS
  Administered 2022-12-02: 20 mg via INTRAVENOUS

## 2022-12-02 MED ORDER — DEXAMETHASONE SODIUM PHOSPHATE 4 MG/ML IJ SOLN
4.0000 mg | INTRAMUSCULAR | Status: AC
  Administered 2022-12-02: 4 mg via INTRAVENOUS

## 2022-12-02 MED ORDER — CHLORHEXIDINE GLUCONATE 0.12 % MT SOLN
15.0000 mL | Freq: Once | OROMUCOSAL | Status: AC
  Administered 2022-12-02: 15 mL via OROMUCOSAL

## 2022-12-02 MED ORDER — EPHEDRINE SULFATE-NACL 50-0.9 MG/10ML-% IV SOSY
PREFILLED_SYRINGE | INTRAVENOUS | Status: DC | PRN
  Administered 2022-12-02 (×2): 5 mg via INTRAVENOUS

## 2022-12-02 MED ORDER — EPHEDRINE 5 MG/ML INJ
INTRAVENOUS | Status: AC
  Filled 2022-12-02: qty 5

## 2022-12-02 MED ORDER — SCOPOLAMINE 1 MG/3DAYS TD PT72
1.0000 | MEDICATED_PATCH | TRANSDERMAL | Status: DC
  Administered 2022-12-02: 1.5 mg via TRANSDERMAL
  Filled 2022-12-02: qty 1

## 2022-12-02 MED ORDER — ONDANSETRON HCL 4 MG/2ML IJ SOLN
INTRAMUSCULAR | Status: AC
  Filled 2022-12-02: qty 2

## 2022-12-02 MED ORDER — MORPHINE SULFATE (PF) 2 MG/ML IV SOLN
1.0000 mg | INTRAVENOUS | Status: DC | PRN
  Administered 2022-12-02 – 2022-12-04 (×7): 2 mg via INTRAVENOUS
  Filled 2022-12-02 (×8): qty 1

## 2022-12-02 MED ORDER — BUPIVACAINE HCL 0.25 % IJ SOLN
INTRAMUSCULAR | Status: AC
  Filled 2022-12-02: qty 1

## 2022-12-02 MED ORDER — HEPARIN SODIUM (PORCINE) 5000 UNIT/ML IJ SOLN
5000.0000 [IU] | Freq: Three times a day (TID) | INTRAMUSCULAR | Status: DC
  Administered 2022-12-02 – 2022-12-06 (×11): 5000 [IU] via SUBCUTANEOUS
  Filled 2022-12-02 (×11): qty 1

## 2022-12-02 MED ORDER — FENTANYL CITRATE (PF) 100 MCG/2ML IJ SOLN
INTRAMUSCULAR | Status: DC | PRN
  Administered 2022-12-02 (×4): 50 ug via INTRAVENOUS
  Administered 2022-12-02: 100 ug via INTRAVENOUS

## 2022-12-02 MED ORDER — LIDOCAINE HCL 2 % IJ SOLN
INTRAMUSCULAR | Status: AC
  Filled 2022-12-02: qty 20

## 2022-12-02 MED ORDER — ALBUMIN HUMAN 5 % IV SOLN: INTRAVENOUS | Status: DC | PRN

## 2022-12-02 MED ORDER — ONDANSETRON HCL 4 MG/2ML IJ SOLN: 4.0000 mg | INTRAMUSCULAR | Status: DC | PRN

## 2022-12-02 MED ORDER — BUPIVACAINE LIPOSOME 1.3 % IJ SUSP
INTRAMUSCULAR | Status: DC | PRN
  Administered 2022-12-02: 20 mL

## 2022-12-02 MED ORDER — FAMOTIDINE IN NACL 20-0.9 MG/50ML-% IV SOLN
20.0000 mg | Freq: Two times a day (BID) | INTRAVENOUS | Status: DC
  Administered 2022-12-02 – 2022-12-05 (×7): 20 mg via INTRAVENOUS
  Filled 2022-12-02 (×8): qty 50

## 2022-12-02 MED ORDER — MIDAZOLAM HCL 2 MG/2ML IJ SOLN: 0.5000 mg | Freq: Once | INTRAMUSCULAR | Status: DC | PRN

## 2022-12-02 MED ORDER — OXYCODONE HCL 5 MG PO TABS: 5.0000 mg | ORAL_TABLET | Freq: Once | ORAL | Status: DC | PRN

## 2022-12-02 MED ORDER — HYDRALAZINE HCL 20 MG/ML IJ SOLN: 10.0000 mg | INTRAMUSCULAR | Status: DC | PRN

## 2022-12-02 MED ORDER — STERILE WATER FOR IRRIGATION IR SOLN
Status: DC | PRN
  Administered 2022-12-02: 1000 mL

## 2022-12-02 MED ORDER — BUPIVACAINE HCL 0.25 % IJ SOLN
INTRAMUSCULAR | Status: DC | PRN
  Administered 2022-12-02: 50 mL

## 2022-12-02 MED ORDER — FENTANYL CITRATE (PF) 250 MCG/5ML IJ SOLN
INTRAMUSCULAR | Status: AC
  Filled 2022-12-02: qty 5

## 2022-12-02 MED ORDER — HYDROMORPHONE HCL 1 MG/ML IJ SOLN
0.2500 mg | INTRAMUSCULAR | Status: DC | PRN
  Administered 2022-12-02: 0.5 mg via INTRAVENOUS

## 2022-12-02 MED ORDER — PROPOFOL 10 MG/ML IV BOLUS
INTRAVENOUS | Status: AC
  Filled 2022-12-02: qty 20

## 2022-12-02 MED ORDER — BUPIVACAINE LIPOSOME 1.3 % IJ SUSP: 20.0000 mL | Freq: Once | INTRAMUSCULAR | Status: DC

## 2022-12-02 MED ORDER — LACTATED RINGERS IR SOLN
Status: DC | PRN
  Administered 2022-12-02: 1000 mL

## 2022-12-02 MED ORDER — SODIUM CHLORIDE 0.9 % IV SOLN
2.0000 g | INTRAVENOUS | Status: AC
  Administered 2022-12-02: 2 g via INTRAVENOUS
  Filled 2022-12-02: qty 2

## 2022-12-02 MED ORDER — ACETAMINOPHEN 500 MG PO TABS
1000.0000 mg | ORAL_TABLET | Freq: Once | ORAL | Status: AC
  Filled 2022-12-02: qty 2

## 2022-12-02 MED ORDER — LACTATED RINGERS IV SOLN: INTRAVENOUS | Status: DC

## 2022-12-02 SURGICAL SUPPLY — 102 items
ANTIFOG SOL W/FOAM PAD STRL (MISCELLANEOUS) ×2
APL PRP STRL LF DISP 70% ISPRP (MISCELLANEOUS) ×4
APL SKNCLS STERI-STRIP NONHPOA (GAUZE/BANDAGES/DRESSINGS)
APPLIER CLIP 5 13 M/L LIGAMAX5 (MISCELLANEOUS)
APPLIER CLIP ROT 10 11.4 M/L (STAPLE)
APPLIER CLIP ROT 13.4 12 LRG (CLIP)
APR CLP LRG 13.4X12 ROT 20 MLT (CLIP)
APR CLP MED LRG 11.4X10 (STAPLE)
APR CLP MED LRG 5 ANG JAW (MISCELLANEOUS)
BAG COUNTER SPONGE SURGICOUNT (BAG) ×2 IMPLANT
BAG SPNG CNTER NS LX DISP (BAG)
BENZOIN TINCTURE PRP APPL 2/3 (GAUZE/BANDAGES/DRESSINGS) IMPLANT
BLADE EXTENDED COATED 6.5IN (ELECTRODE) IMPLANT
BLADE SURG SZ10 CARB STEEL (BLADE) IMPLANT
BLADE SURG SZ11 CARB STEEL (BLADE) ×2 IMPLANT
BNDG ADH 1X3 SHEER STRL LF (GAUZE/BANDAGES/DRESSINGS) IMPLANT
BNDG ADH THN 3X1 STRL LF (GAUZE/BANDAGES/DRESSINGS)
CABLE HIGH FREQUENCY MONO STRZ (ELECTRODE) IMPLANT
CHLORAPREP W/TINT 26 (MISCELLANEOUS) ×2 IMPLANT
CLIP APPLIE 5 13 M/L LIGAMAX5 (MISCELLANEOUS) IMPLANT
CLIP APPLIE ROT 10 11.4 M/L (STAPLE) IMPLANT
CLIP APPLIE ROT 13.4 12 LRG (CLIP) IMPLANT
COVER SURGICAL LIGHT HANDLE (MISCELLANEOUS) ×2 IMPLANT
DEVICE SUTURE ENDOST 10MM (ENDOMECHANICALS) ×2 IMPLANT
DRAIN CHANNEL 19F RND (DRAIN) IMPLANT
DRAIN PENROSE 0.25X18 (DRAIN) ×2 IMPLANT
DRAPE WARM FLUID 44X44 (DRAPES) IMPLANT
ELECT L-HOOK LAP 45CM DISP (ELECTROSURGICAL) ×2
ELECTRODE L-HOOK LAP 45CM DISP (ELECTROSURGICAL) ×2 IMPLANT
EVACUATOR SILICONE 100CC (DRAIN) IMPLANT
GAUZE 4X4 16PLY ~~LOC~~+RFID DBL (SPONGE) ×2 IMPLANT
GAUZE SPONGE 4X4 12PLY STRL (GAUZE/BANDAGES/DRESSINGS) IMPLANT
GLOVE BIOGEL PI IND STRL 7.0 (GLOVE) ×2 IMPLANT
GLOVE SURG SS PI 7.0 STRL IVOR (GLOVE) ×2 IMPLANT
GOWN STRL REUS W/ TWL LRG LVL3 (GOWN DISPOSABLE) ×2 IMPLANT
GOWN STRL REUS W/ TWL XL LVL3 (GOWN DISPOSABLE) IMPLANT
GOWN STRL REUS W/TWL LRG LVL3 (GOWN DISPOSABLE) ×2
GOWN STRL REUS W/TWL XL LVL3 (GOWN DISPOSABLE)
GRASPER SUT TROCAR 14GX15 (MISCELLANEOUS) IMPLANT
HANDLE SUCTION POOLE (INSTRUMENTS) IMPLANT
IRRIG SUCT STRYKERFLOW 2 WTIP (MISCELLANEOUS) ×2
IRRIGATION SUCT STRKRFLW 2 WTP (MISCELLANEOUS) ×2 IMPLANT
KIT BASIN OR (CUSTOM PROCEDURE TRAY) ×2 IMPLANT
KIT GASTRIC LAVAGE 34FR ADT (SET/KITS/TRAYS/PACK) IMPLANT
KIT TURNOVER KIT A (KITS) IMPLANT
MARKER SKIN DUAL TIP RULER LAB (MISCELLANEOUS) ×2 IMPLANT
MAT PREVALON FULL STRYKER (MISCELLANEOUS) ×2 IMPLANT
NDL SPNL 22GX3.5 QUINCKE BK (NEEDLE) ×2 IMPLANT
NEEDLE SPNL 22GX3.5 QUINCKE BK (NEEDLE) ×2 IMPLANT
PACK CARDIOVASCULAR III (CUSTOM PROCEDURE TRAY) ×2 IMPLANT
PENCIL HANDSWITCHING (ELECTRODE) IMPLANT
PENCIL SMOKE EVACUATOR (MISCELLANEOUS) IMPLANT
RELOAD BL CONTOUR (ENDOMECHANICALS) ×2 IMPLANT
RELOAD ENDO STITCH 2.0 (ENDOMECHANICALS)
RELOAD STAPLE 40 BLU REG (ENDOMECHANICALS) IMPLANT
RELOAD STAPLE 60 2.6 WHT THN (STAPLE) ×6 IMPLANT
RELOAD STAPLE 60 3.6 BLU REG (STAPLE) ×8 IMPLANT
RELOAD STAPLE 60 3.8 GOLD REG (STAPLE) IMPLANT
RELOAD STAPLER BLUE 60MM (STAPLE) ×4 IMPLANT
RELOAD STAPLER GOLD 60MM (STAPLE) IMPLANT
RELOAD STAPLER WHITE 60MM (STAPLE) IMPLANT
RELOAD SUT SNGL STCH ABSRB 2-0 (ENDOMECHANICALS) ×10 IMPLANT
RELOAD SUT SNGL STCH BLK 2-0 (ENDOMECHANICALS) ×12 IMPLANT
SCISSORS LAP 5X45 EPIX DISP (ENDOMECHANICALS) ×2 IMPLANT
SET TUBE SMOKE EVAC HIGH FLOW (TUBING) ×2 IMPLANT
SHEARS HARMONIC ACE PLUS 45CM (MISCELLANEOUS) ×2 IMPLANT
SLEEVE Z-THREAD 12X100MM (TROCAR) IMPLANT
SLEEVE Z-THREAD 5X100MM (TROCAR) ×6 IMPLANT
SOLUTION ANTFG W/FOAM PAD STRL (MISCELLANEOUS) ×2 IMPLANT
SPONGE T-LAP 18X18 ~~LOC~~+RFID (SPONGE) IMPLANT
STAPLE ECHEON FLEX 60 POW ENDO (STAPLE) IMPLANT
STAPLER AUT SUT 4.8 EEAXL 21 (STAPLE) IMPLANT
STAPLER CVD CUT BL 40 RELOAD (ENDOMECHANICALS) ×2 IMPLANT
STAPLER CVD CUT BLU 40 RELOAD (ENDOMECHANICALS) IMPLANT
STAPLER ECHELON LONG 3000 60 (ENDOMECHANICALS) ×2 IMPLANT
STAPLER RELOAD BLUE 60MM (STAPLE) ×4
STAPLER RELOAD GOLD 60MM (STAPLE)
STAPLER RELOAD WHITE 60MM (STAPLE)
STAPLER VISISTAT 35W (STAPLE) IMPLANT
STRIP CLOSURE SKIN 1/2X4 (GAUZE/BANDAGES/DRESSINGS) IMPLANT
SUCTION POOLE HANDLE (INSTRUMENTS) ×2
SUT ETHIBOND 0 36 GRN (SUTURE) IMPLANT
SUT ETHILON 2 0 PS N (SUTURE) IMPLANT
SUT MNCRL AB 4-0 PS2 18 (SUTURE) ×2 IMPLANT
SUT PDS AB 0 CT1 36 (SUTURE) IMPLANT
SUT RELOAD ENDO STITCH 2 48X1 (ENDOMECHANICALS)
SUT RELOAD ENDO STITCH 2.0 (ENDOMECHANICALS)
SUT SILK 0 SH 30 (SUTURE) IMPLANT
SUT SILK 3 0 SH CR/8 (SUTURE) IMPLANT
SUT VICRYL 0 TIES 12 18 (SUTURE) IMPLANT
SUTURE RELOAD END STTCH 2 48X1 (ENDOMECHANICALS) IMPLANT
SUTURE RELOAD ENDO STITCH 2.0 (ENDOMECHANICALS) IMPLANT
SYR 10ML LL (SYRINGE) IMPLANT
SYR 20ML LL LF (SYRINGE) ×2 IMPLANT
SYR 50ML LL SCALE MARK (SYRINGE) ×2 IMPLANT
TOWEL OR 17X26 10 PK STRL BLUE (TOWEL DISPOSABLE) ×2 IMPLANT
TOWEL OR NON WOVEN STRL DISP B (DISPOSABLE) ×2 IMPLANT
TROCAR Z THREAD OPTICAL 12X100 (TROCAR) ×2 IMPLANT
TROCAR Z-THREAD OPTICAL 5X100M (TROCAR) ×2 IMPLANT
TUBE GASTRO BOLUS 18FR ENFIT (TUBING) IMPLANT
TUBING CONNECTING 10 (TUBING) ×2 IMPLANT
YANKAUER SUCT BULB TIP 10FT TU (MISCELLANEOUS) IMPLANT

## 2022-12-02 NOTE — Anesthesia Procedure Notes (Signed)
Procedure Name: Intubation Date/Time: 12/02/2022 9:45 AM  Performed by: Epimenio Sarin, CRNAPre-anesthesia Checklist: Patient identified, Emergency Drugs available, Suction available, Patient being monitored and Timeout performed Patient Re-evaluated:Patient Re-evaluated prior to induction Oxygen Delivery Method: Circle system utilized Preoxygenation: Pre-oxygenation with 100% oxygen Induction Type: IV induction Ventilation: Mask ventilation without difficulty and Oral airway inserted - appropriate to patient size Laryngoscope Size: Glidescope and 4 Grade View: Grade I Tube type: Oral Tube size: 7.5 mm Number of attempts: 1 Airway Equipment and Method: Rigid stylet and Video-laryngoscopy Placement Confirmation: ETT inserted through vocal cords under direct vision, positive ETCO2 and breath sounds checked- equal and bilateral Secured at: 23 cm Tube secured with: Tape Dental Injury: Teeth and Oropharynx as per pre-operative assessment  Comments: Elective glidescope

## 2022-12-02 NOTE — Transfer of Care (Signed)
Immediate Anesthesia Transfer of Care Note  Patient: Hector Curry  Procedure(s) Performed: LAPAROSCOPIC converted to OPEN GASTRIC BYPASS REVERSAL, TAKEDOWN OF GASTROJEJUNAL ANASTOMOSIS, GASTRO GASTRIC ANASTOMOSIS WITH UPPER ENDOSCOPY, laparoscopic lysis of adhesions INSERTION OF GASTROSTOMY TUBE (Abdomen)  Patient Location: PACU  Anesthesia Type:General  Level of Consciousness: drowsy and patient cooperative  Airway & Oxygen Therapy: Patient Spontanous Breathing and Patient connected to face mask oxygen  Post-op Assessment: Report given to RN and Post -op Vital signs reviewed and stable  Post vital signs: Reviewed and stable  Last Vitals:  Vitals Value Taken Time  BP 146/78 12/02/22 1453  Temp    Pulse 70 12/02/22 1455  Resp 14 12/02/22 1455  SpO2 100 % 12/02/22 1455  Vitals shown include unvalidated device data.  Last Pain:  Vitals:   12/02/22 0800  TempSrc:   PainSc: 0-No pain         Complications: No notable events documented.

## 2022-12-02 NOTE — Anesthesia Preprocedure Evaluation (Signed)
Anesthesia Evaluation  Patient identified by MRN, date of birth, ID band Patient awake    Reviewed: Allergy & Precautions, NPO status , Patient's Chart, lab work & pertinent test results, reviewed documented beta blocker date and time   History of Anesthesia Complications Negative for: history of anesthetic complications  Airway Mallampati: III  TM Distance: >3 FB Neck ROM: Full    Dental  (+) Dental Advisory Given   Pulmonary sleep apnea and Continuous Positive Airway Pressure Ventilation , former smoker   breath sounds clear to auscultation       Cardiovascular hypertension, Pt. on medications and Pt. on home beta blockers (-) angina + CAD, + Past MI and + Cardiac Stents  + dysrhythmias Supra Ventricular Tachycardia  Rhythm:Regular Rate:Normal  '22 CATH: LM nl, LAD 80p (3.0x18 Onyx Frontier DES), D1 90 (too small for PCI), LCX nl, RCA nl. '22 ECHO: EF 50-55%, apical HK, mod asymm basal-septal hypertrophy. Nl RV fxn.   Neuro/Psych negative neurological ROS  negative psych ROS   GI/Hepatic Neg liver ROS,GERD  Controlled,,S/p lap band   Endo/Other  diabetes (glu 153), Insulin Dependent    Renal/GU negative Renal ROS     Musculoskeletal  (+) Arthritis ,    Abdominal   Peds  Hematology plavix   Anesthesia Other Findings   Reproductive/Obstetrics                             Anesthesia Physical Anesthesia Plan  ASA: 3  Anesthesia Plan: General   Post-op Pain Management: Tylenol PO (pre-op)*   Induction: Intravenous  PONV Risk Score and Plan: 2 and Ondansetron and Dexamethasone  Airway Management Planned: Oral ETT and Video Laryngoscope Planned  Additional Equipment: None  Intra-op Plan:   Post-operative Plan: Extubation in OR  Informed Consent: I have reviewed the patients History and Physical, chart, labs and discussed the procedure including the risks, benefits and alternatives  for the proposed anesthesia with the patient or authorized representative who has indicated his/her understanding and acceptance.     Dental advisory given  Plan Discussed with: CRNA and Surgeon  Anesthesia Plan Comments:        Anesthesia Quick Evaluation

## 2022-12-02 NOTE — Progress Notes (Signed)
PHARMACY CONSULT FOR:  Risk Assessment for Post-Discharge VTE Following Bariatric Surgery  Post-Discharge VTE Risk Assessment: This patient's probability of 30-day post-discharge VTE is increased due to the factors marked:  Sleeve gastrectomy   Liver disorder (transplant, cirrhosis, or nonalcoholic steatohepatitis)   Hx of VTE   Hemorrhage requiring transfusion   GI perforation, leak, or obstruction   ====================================================   x Male   x Age >/=60 years    BMI >/=50 kg/m2    CHF    Dyspnea at Rest    Paraplegia    Non-gastric-band surgery   x Operation Time >/=3 hr    Return to OR     Length of Stay >/= 3 d   Hypercoagulable condition   Significant venous stasis      Predicted probability of 30-day post-discharge VTE: 0.93%  **Patient here for gastric bypass reversal. Per discussion with Dr. Sheliah HatchKinsinger, patient likely does not qualify for post-discharge bariatric VTE prophylaxis given procedure. However, if indicated please see recommendation below based on above risk factors in patient normally following bariatric surgery.**   Recommendation for Discharge if indicated:  Enoxaparin 40 mg [place] q12h x 2 weeks post-discharge    Hector Curry is a 62 y.o. male who underwent gastric bypass reversal on 4/9   Case start: 1012 Case end: 1432   Allergies  Allergen Reactions   Other Itching and Other (See Comments)    Cats Wheezing, runny nose/eyes, congestion     Patient Measurements: Height: 6\' 2"  (188 cm) Weight: 103 kg (227 lb 1.2 oz) IBW/kg (Calculated) : 82.2 Body mass index is 29.15 kg/m.  No results for input(s): "WBC", "HGB", "HCT", "PLT", "APTT", "CREATININE", "LABCREA", "CREAT24HRUR", "MG", "PHOS", "ALBUMIN", "PROT", "AST", "ALT", "ALKPHOS", "BILITOT", "BILIDIR", "IBILI" in the last 72 hours. Estimated Creatinine Clearance: 125.7 mL/min (A) (by C-G formula based on SCr of 0.55 mg/dL (L)).    Past Medical History:  Diagnosis  Date   Allergic rhinitis    Atrial tachycardia    CAD (coronary artery disease)    a. 05/08/2015 Cath: no significant CAD, LVEF nl-->Med; b. 07/2017 MV: attenuation artifact, no ischemia, EF 65%-->Low risk; c. 03/2021 NSTEMI/PCI: LM nl, LAD 80p (3.0x18 Onyx Frontier DES), D1 90 (too small for PCI), LCX nl, RCA nl.   Diabetes mellitus without complication    Diastolic dysfunction    a. 04/2015 Echo: EF 60-65%; b. 05/2016 Echo: EF 60-65%, GrI DD; c. 07/2017 Echo: EF 55-60%, Ao root 42mm; d. 03/2019 Echo: EF 55-60%, Ao root/Asc Ao 42mm; e. 03/2021 Echo: EF 50-55%, apical HK, mod asymm basal-septal hypertrophy. Nl RV fxn. Asc Ao 44mm.   Dilated aortic root    a. 07/2017 Echo: 42mm Ao root - mildly dil; b. 03/2019 Echo: Ao root 42mm; c. 03/2021 Echo: Asc Ao 44mm.   Diverticulosis 10/03/2014   Dysrhythmia    Esophageal stricture    a.  In setting of lap band June 2022-stricture at the GJ anastomosis requiring gastrostomy tube; b. 05/2021 s/p esoph stenting (15mm); c. 07/2021 s/p esoph stenting (20mm).   Family history of premature CAD    a. father passed from MI at 3849   GERD (gastroesophageal reflux disease)    GIB (gastrointestinal bleeding)    a. 07/2021 following esoph stenting.   Hemorrhoids    a. internal hemorrhoids s/p surgery 1999   History of kidney stones    History of tobacco abuse    Hyperlipidemia    Hyperplastic colon polyp 10/03/2014   a. x 2  Hypertension    Inflammatory arthritis    a. CCP antibodies & x-rays negative. Rheumatoid factor 14, felt to be crystaline over RA or psoriatic   Iron deficiency anemia 02/05/2022   Morbid obesity    a. s/p LAP-BAND - complicated by esoph stricture.   Myocardial infarction    NSTEMI   OSA (obstructive sleep apnea)    a. on CPAP   Osteoarthritis    PSVT (paroxysmal supraventricular tachycardia)    a. 48 hr Holter 04/2015: NSR w/ rare PVC, short runs of narrow complex tachycardiac, possible atrial tach, longest run 7 beats, PACs noted (2%  of all beats 3600 total) they did not seem to correlate w/ significant arrythmia; b. 08/2017 Event monitor: no significant arrhythmias.     Medications Prior to Admission  Medication Sig Dispense Refill Last Dose   aspirin EC 81 MG tablet Take 1 tablet (81 mg total) by mouth daily. Use while holding Plavix. 150 tablet 2 12/01/2022   atorvastatin (LIPITOR) 10 MG tablet Take 1 tablet (10 mg total) by mouth daily. 90 tablet 0 12/01/2022   Continuous Blood Gluc Sensor (FREESTYLE LIBRE 14 DAY SENSOR) MISC Insert 1 sensor to the back of the arm as directed every 14 days for continuous glucose monitoring 6 each 1 12/01/2022   ferrous sulfate 325 (65 FE) MG tablet Take 1 tablet by mouth 2 times daily with a meal. (Patient taking differently: Take 325 mg by mouth daily.) 600 tablet 0 12/01/2022   fluticasone (FLONASE) 50 MCG/ACT nasal spray Place 2-3 sprays into both nostrils daily as needed for allergies or rhinitis.   11/18/2022   insulin lispro (HUMALOG) 100 UNIT/ML KwikPen Inject subcutaneously 3 (three) times daily with meals Per sliding scale 15 mL 1 Past Month   loratadine (CLARITIN) 10 MG tablet Take 10 mg by mouth daily.   12/01/2022   METOPROLOL SUCCINATE PO Take by mouth.   12/01/2022 at 0730   mometasone (ELOCON) 0.1 % lotion Apply to affected areas once daily (Patient taking differently: Apply 1 Application topically as needed (psoriasis).) 60 mL 5 Past Month   Multiple Vitamin (MULTI-VITAMIN DAILY PO) Take 2 tablets by mouth daily. Bariartic   12/01/2022   nitroGLYCERIN (NITROSTAT) 0.4 MG SL tablet Place 1 tablet (0.4 mg total) under the tongue every 5 (five) minutes as needed for chest pain. 25 tablet 3 has not used, not needed   pantoprazole (PROTONIX) 40 MG tablet Take 1 tablet (40 mg total) by mouth 2 (two) times daily before a meal. 60 tablet 12 12/01/2022   PRESCRIPTION MEDICATION at bedtime. Bipap   12/01/2022   propranolol (INDERAL) 20 MG tablet Take 1 tablet (20 mg total) by mouth every 8 (eight) hours.  (Patient taking differently: Take 20 mg by mouth daily.) 270 tablet 3 12/01/2022 at 2100   Propylene Glycol (SYSTANE BALANCE OP) Place 2-3 drops into both eyes daily as needed (dry eyes).   Past Week   Pseudoeph-Doxylamine-DM-APAP (NYQUIL PO) Take 2 tablets by mouth at bedtime as needed (pain).   12/01/2022   sucralfate (CARAFATE) 1 g tablet Take 1 tablet (1 g total) by mouth 2 (two) times daily. 120 tablet 1 12/01/2022   VITAMIN E PO Take 1 tablet by mouth daily.   12/01/2022   clopidogrel (PLAVIX) 75 MG tablet Take 1 tablet (75 mg total) by mouth daily. 90 tablet 3 11/22/2022   Potassium Chloride ER 20 MEQ TBCR Take 1 tablet by mouth 2 times daily (Patient not taking: Reported on 11/19/2022) 60  tablet 5 Not Taking     Pricilla Riffle, PharmD, BCPS Clinical Pharmacist 12/02/2022 3:26 PM

## 2022-12-02 NOTE — Op Note (Signed)
Preoperative diagnosis: gastrojejunal stricture  Postoperative diagnosis: same   Procedures:  laparoscopic lysis of adhesions 2. Partial gastrectomy 3. Small bowel resection 4. Creation of esophagus to gastric anastomosis 5. Creation of gastrostomy  Surgeon: Feliciana Rossetti, M.D.  Asst: Phylliss Blakes, M.D.  Anesthesia: GETA  Indications for procedure: Hector Curry is a 62 y.o. year old male with symptoms of GJ stricture after RNY gastric bypass. This did not respond to stent and after discussion of all available options, decision was made to proceed with gastric bypass reversal.  Description of procedure: The patient was brought into the operative suite. Anesthesia was administered with General endotracheal anesthesia. WHO checklist was applied. The patient was then placed in supine position. The area was prepped and draped in the usual sterile fashion.  Next, a right subcostal incision was made. A 100mm trocar was used to gain access to the peritoneal cavity by optical entry technique. Pneumoperitoneum was applied with a high flow and low pressure. The laparoscope was reinserted to confirm position.  4 additional incisions were made. 1 5 mm trocar was placed into the left lateral abdomen, 1 5 mm trocar was placed into the left subcostal area, 1 12 mm trocar was placed in the right mid abdomen, and 1 5 mm trocar was placed in the left periumbilical area. Exparel:Marcaine mix was placed into the right and left transversus planes.  There were moderate adhesions of the omentum to the upper abdominal wall and the previous gastrostomy site was seen as the stomach came up to the left subcostal area. Harmonic scalpel was used to dissect this area free. Next, a Satira Mccallum was placed in the subxiphoid region.  The JJ anastomosis was inspected.  There is no inflammatory changes to the staple lines.  The Roux limb was then run up to the gastrojejunal anastomosis.  The gastrojejunal anastomosis was  densely scarred to the liver.  Scalpel was used to free adhesions of the omentum to the Roux limb.  Harmonic scalpel and scissors were used to slowly and methodically dissected the liver off of the Roux limb and anastomosis and pouch.  This proved very difficult.  Once the anastomosis was completely visible being dissected beyond the liver scar, Dr. Andrey Campanile came in and performed an upper endoscopy showing a very small pouch and a stenotic anastomosis.  It was also noted at the time that there were bubbles from an area of dissection near the anastomosis consistent with gastrotomy.  Dissection was continued and the remnant was completely freed from the left lateral edge of the pouch.  The pouch was partially freed from the diaphragm on the left side.  Additional dissection was done on the anterior portion away from the liver.  This resulted in a full-thickness gastrotomy.  Since this was likely near the GE junction, decision was made to proceed to an open procedure. Total laparoscopic lysis of adhesions time was 150 minutes.  An upper midline incision was made.  Cautery was used dissect down through the cutaneous tissues and fascia was entered in the midline.  Bookwalter was placed.  Liver was retracted.  Digital palpation was used to further define the edges of the pouch and the hiatus of the diaphragm.  Additional sharp dissection with Metzenbaum scissors and cautery was used to dissect the liver free of the anterior pouch.  In order to better see the posterior area, a window was created on the Roux just before the JJ anastomosis.  A blue load contour stapler was used to divide  the Roux limb.  Harmonic scalpel was used to divide the mesentery of the Roux limb.  The mesentery of the JJ was closed with interrupted 3-0 silks.  The left gastric was taken and tied off with 3-0 silk.  Additional scar on the posterior was removed with harmonic scalpel.  This allowed complete mobilization of the pouch and distal esophagus.   The GE junction was then divided with a blue load contour stapler.  Next, decision was made to proceed with a Orvil type EEA anastomosis of the esophagus to the stomach.  The 21 fr Orval was passed and brought just posterior to the staple line.  Next the gastrotomy was made at previous G-tube site.  And the stapler was passed into the remnant stomach up to the anterior fundus.  The spike was passed out and connected to the Orvil anvil and anastomosis was created.  Donuts were inspected and intact in 360 degrees.  The gastrotomy was closed with 2 60 mm blue load laparoscopic Echelon stapler.  Next, Dr. Fredricka Bonine performed an upper endoscopy which showed passage of air across the anastomosis without leak.  A 19 [place] Blake drain was brought through the right subcostal incision with tip posterior to the anastomosis.  Next, a portion of the distal stomach after the gastrotomy site closure was chosen for G-tube.  3-0 silk was used for pursestring.  36 French G-tube was then brought through previous G-tube skin location, a gastrotomy was made, and the G-tube was inserted into the stomach.  10 cc of saline was inserted into the balloon.  Pursestring was tied down.  3 3-0 silks were used to secure the stomach to the abdominal wall.  The G-tube was located at 4-1/2 cm at the skin.    Midline incision was closed with 0 PDS in running fashion.  Skin staplers were used to close all skin incisions.  Bandages were put in place.  Patient awoke from anesthesia brought to PACU in stable condition.  All counts were correct.  Findings: No leak of esophagogastric anastomosis  Specimen: Gastrojejunal anastomosis and Roux limb (this specimen represents partial gastrectomy and small intestine procedures)  Implant: 106 French Blake drain in the right subcostal area, 18 [place] G-tube in the left subcostal area  Blood loss: 150 ml  Local anesthesia:  50 ml Exparel:Marcaine Mix  Complications: none  Feliciana Rossetti,  M.D. General, Bariatric, & Minimally Invasive Surgery [facility] [place]

## 2022-12-02 NOTE — H&P (Signed)
**Note Hector-Identified via Obfuscation** Hector Curry is an 62 y.o. male.  HPI: He is a 62 yo male who underwent band removal and conversion to gastric bypass which was complicated with GJ stricture treated with stent for 1 year without resolution. He presents for bypass reversal.  Past Medical History:  Diagnosis Date   Allergic rhinitis    Atrial tachycardia    CAD (coronary artery disease)    a. 05/08/2015 Cath: no significant CAD, LVEF nl-->Med; b. 07/2017 MV: attenuation artifact, no ischemia, EF 65%-->Low risk; c. 03/2021 NSTEMI/PCI: LM nl, LAD 80p (3.0x18 Onyx Frontier DES), D1 90 (too small for PCI), LCX nl, RCA nl.   Diabetes mellitus without complication    Diastolic dysfunction    a. 04/2015 Echo: EF 60-65%; b. 05/2016 Echo: EF 60-65%, GrI DD; c. 07/2017 Echo: EF 55-60%, Ao root 67mm; d. 03/2019 Echo: EF 55-60%, Ao root/Asc Ao 5mm; e. 03/2021 Echo: EF 50-55%, apical HK, mod asymm basal-septal hypertrophy. Nl RV fxn. Asc Ao 42mm.   Dilated aortic root    a. 07/2017 Echo: 83mm Ao root - mildly dil; b. 03/2019 Echo: Ao root 21mm; c. 03/2021 Echo: Asc Ao 35mm.   Diverticulosis 10/03/2014   Dysrhythmia    Esophageal stricture    a.  In setting of lap band June 2022-stricture at the GJ anastomosis requiring gastrostomy tube; b. 05/2021 s/p esoph stenting (10mm); c. 07/2021 s/p esoph stenting (91mm).   Family history of premature CAD    a. father passed from MI at 69   GERD (gastroesophageal reflux disease)    GIB (gastrointestinal bleeding)    a. 07/2021 following esoph stenting.   Hemorrhoids    a. internal hemorrhoids s/p surgery 1999   History of kidney stones    History of tobacco abuse    Hyperlipidemia    Hyperplastic colon polyp 10/03/2014   a. x 2    Hypertension    Inflammatory arthritis    a. CCP antibodies & x-rays negative. Rheumatoid factor 14, felt to be crystaline over RA or psoriatic   Iron deficiency anemia 02/05/2022   Morbid obesity    a. s/p LAP-BAND - complicated by esoph stricture.    Myocardial infarction    NSTEMI   OSA (obstructive sleep apnea)    a. on CPAP   Osteoarthritis    PSVT (paroxysmal supraventricular tachycardia)    a. 48 hr Holter 04/2015: NSR w/ rare PVC, short runs of narrow complex tachycardiac, possible atrial tach, longest run 7 beats, PACs noted (2% of all beats 3600 total) they did not seem to correlate w/ significant arrythmia; b. 08/2017 Event monitor: no significant arrhythmias.    Past Surgical History:  Procedure Laterality Date   BALLOON DILATION N/A 08/14/2021   Procedure: BALLOON DILATION;  Surgeon: Mansouraty, Netty Starring., MD;  Location: [facility];  Service: Gastroenterology;  Laterality: N/A;   BALLOON DILATION N/A 02/20/2022   Procedure: BALLOON DILATION;  Surgeon: Rachael Fee, MD;  Location: [facility];  Service: Gastroenterology;  Laterality: N/A;   BALLOON DILATION N/A 03/27/2022   Procedure: BALLOON DILATION;  Surgeon: Meridee Score Netty Starring., MD;  Location: [facility] ENDOSCOPY;  Service: Gastroenterology;  Laterality: N/A;   BARIATRIC SURGERY  01/2021   lap band    BILIARY STENT PLACEMENT N/A 08/14/2021   Procedure: AXIOS STENT PLACEMENT;  Surgeon: Lemar Lofty., MD;  Location: [facility];  Service: Gastroenterology;  Laterality: N/A;   BILIARY STENT PLACEMENT N/A 10/16/2022   Procedure: STENT PLACEMENT;  Surgeon: Lemar Lofty., MD;  [facility]: [facility];  Service: Gastroenterology;  Laterality: N/A;   BIOPSY  07/08/2021   Procedure: BIOPSY;  Surgeon: Meridee ScoreMansouraty, Netty StarringGabriel Jr., MD;  [facility];  Service: Gastroenterology;;   BIOPSY  08/14/2021   Procedure: BIOPSY;  Surgeon: Lemar LoftyMansouraty, Gabriel Jr., MD;  [facility];  Service: Gastroenterology;;   BIOPSY  10/16/2022   Procedure: BIOPSY;  Surgeon: Lemar LoftyMansouraty, Gabriel Jr., MD;  [facility];  Service: Gastroenterology;;   CARDIAC CATHETERIZATION N/A 05/08/2015   Procedure: Left Heart Cath and Coronary Angiography;  Surgeon:  Corky CraftsJayadeep S Varanasi, MD;  [facility];  Service: Cardiovascular;  Laterality: N/A;   CARPAL TUNNEL RELEASE Bilateral    CHOLECYSTECTOMY     COLONOSCOPY  06/27/2005   COLONOSCOPY  10/03/2014   CORONARY STENT INTERVENTION N/A 04/15/2021   Procedure: CORONARY STENT INTERVENTION;  Surgeon: Runell GessBerry, Jonathan J, MD;  [facility]: [facility];  Service: Cardiovascular;  Laterality: N/A;   DUODENAL STENT PLACEMENT N/A 11/18/2021   Procedure: DUODENAL STENT PLACEMENT;  Surgeon: Meridee ScoreMansouraty, Netty StarringGabriel Jr., MD;  [facility];  Service: Gastroenterology;  Laterality: N/A;  axios placed at GJ anastamosis   DUODENAL STENT PLACEMENT  03/27/2022   Procedure: GASTRIC STENT PLACEMENT;  Surgeon: Meridee ScoreMansouraty, Netty StarringGabriel Jr., MD;  [facility];  Service: Gastroenterology;;   ESOPHAGEAL DILATION  05/27/2021   Procedure: ESOPHAGEAL DILATION;  Surgeon: Lemar LoftyMansouraty, Gabriel Jr., MD;  [facility];  Service: Gastroenterology;;   ESOPHAGEAL DILATION  01/23/2022   Procedure: ESOPHAGEAL DILATION;  Surgeon: Lemar LoftyMansouraty, Gabriel Jr., MD;  [facility];  Service: Gastroenterology;;   ESOPHAGEAL STENT PLACEMENT N/A 05/27/2021   Procedure: ESOPHAGEAL STENT PLACEMENT;  Surgeon: Lemar LoftyMansouraty, Gabriel Jr., MD;  [facility];  Service: Gastroenterology;  Laterality: N/A;   ESOPHAGOGASTRODUODENOSCOPY  06/27/2005   ESOPHAGOGASTRODUODENOSCOPY N/A 03/19/2021   Procedure: ESOPHAGOGASTRODUODENOSCOPY (EGD);  Surgeon: Sheliah HatchKinsinger, Hector BlanchLuke Aaron, MD;  [facility];  Service: General;  Laterality: N/A;   ESOPHAGOGASTRODUODENOSCOPY N/A 08/16/2021   Procedure: ESOPHAGOGASTRODUODENOSCOPY (EGD);  Surgeon: Lemar LoftyMansouraty, Gabriel Jr., MD;  [facility];  Service: Gastroenterology;  Laterality: N/A;   ESOPHAGOGASTRODUODENOSCOPY (EGD) WITH PROPOFOL N/A 05/27/2021   Procedure: ESOPHAGOGASTRODUODENOSCOPY (EGD) WITH PROPOFOL;  Surgeon: Meridee ScoreMansouraty, Netty StarringGabriel Jr., MD;  [facility];  Service: Gastroenterology;  Laterality: N/A;   ESOPHAGOGASTRODUODENOSCOPY (EGD) WITH PROPOFOL N/A 07/08/2021   Procedure: ESOPHAGOGASTRODUODENOSCOPY (EGD) WITH PROPOFOL;  Surgeon: Meridee ScoreMansouraty, Netty StarringGabriel Jr., MD;  [facility];  Service: Gastroenterology;  Laterality: N/A;  fluoro   ESOPHAGOGASTRODUODENOSCOPY (EGD) WITH PROPOFOL N/A 08/14/2021   Procedure: ESOPHAGOGASTRODUODENOSCOPY (EGD) WITH PROPOFOL;  Surgeon: Meridee ScoreMansouraty, Netty StarringGabriel Jr., MD;  [facility];  Service: Gastroenterology;  Laterality: N/A;  fluoro Axios stent (20 mm)   ESOPHAGOGASTRODUODENOSCOPY (EGD) WITH PROPOFOL N/A 10/16/2021   Procedure: ESOPHAGOGASTRODUODENOSCOPY (EGD) WITH PROPOFOL;  Surgeon: Meridee ScoreMansouraty, Netty StarringGabriel Jr., MD;  [facility];  Service: Gastroenterology;  Laterality: N/A;  axios stent pull fluoro   ESOPHAGOGASTRODUODENOSCOPY (EGD) WITH PROPOFOL N/A 11/18/2021   Procedure: ESOPHAGOGASTRODUODENOSCOPY (EGD) WITH PROPOFOL;  Surgeon: Meridee ScoreMansouraty, Netty StarringGabriel Jr., MD;  [facility];  Service: Gastroenterology;  Laterality: N/A;   ESOPHAGOGASTRODUODENOSCOPY (EGD) WITH PROPOFOL N/A 01/23/2022   Procedure: ESOPHAGOGASTRODUODENOSCOPY (EGD) WITH PROPOFOL;  Surgeon: Meridee ScoreMansouraty, Netty StarringGabriel Jr., MD;  [facility];  Service: Gastroenterology;  Laterality: N/A;   ESOPHAGOGASTRODUODENOSCOPY (EGD) WITH PROPOFOL N/A 02/20/2022   Procedure: ESOPHAGOGASTRODUODENOSCOPY (EGD) WITH PROPOFOL;  Surgeon: Rachael FeeJacobs, Daniel P, MD;  [facility];  Service: Gastroenterology;  Laterality: N/A;   ESOPHAGOGASTRODUODENOSCOPY (EGD) WITH PROPOFOL N/A 03/27/2022   Procedure: ESOPHAGOGASTRODUODENOSCOPY (  EGD) WITH PROPOFOL;  Surgeon: Mansouraty, Netty Starring., MD;  Location: [facility] ENDOSCOPY;  Service: Gastroenterology;  Laterality: N/A;   ESOPHAGOGASTRODUODENOSCOPY (EGD) WITH PROPOFOL N/A 08/14/2022   Procedure: ESOPHAGOGASTRODUODENOSCOPY (EGD) WITH PROPOFOL;  Surgeon: Meridee Score Netty Starring., MD;  [facility];  Service: Gastroenterology;  Laterality: N/A;   ESOPHAGOGASTRODUODENOSCOPY (EGD) WITH PROPOFOL N/A 10/16/2022   Procedure: ESOPHAGOGASTRODUODENOSCOPY (EGD) WITH PROPOFOL;  Surgeon: Meridee Score Netty Starring., MD;  [facility];  Service: Gastroenterology;  Laterality: N/A;   ESOPHAGOGASTRODUODENOSCOPY (EGD) WITH PROPOFOL N/A 11/28/2022   Procedure: ESOPHAGOGASTRODUODENOSCOPY (EGD) WITH PROPOFOL WITH AXIOS STENT REMOVAL;  Surgeon: Meridee Score Netty Starring., MD;  Location: [facility] ENDOSCOPY;  Service: Gastroenterology;  Laterality: N/A;   FLEXIBLE SIGMOIDOSCOPY N/A 08/16/2021   Procedure: FLEXIBLE SIGMOIDOSCOPY;  Surgeon: Meridee Score Netty Starring., MD;  Location: [facility] ENDOSCOPY;  Service: Gastroenterology;  Laterality: N/A;   FOREIGN BODY REMOVAL  02/20/2022   Procedure: FOREIGN BODY REMOVAL;  Surgeon: Rachael Fee, MD;  [facility];  Service: Gastroenterology;;   FOREIGN BODY REMOVAL ESOPHAGEAL  03/27/2022   Procedure: REMOVAL FOREIGN BODY;  Surgeon: Meridee Score Netty Starring., MD;  Location: [facility] ENDOSCOPY;  Service: Gastroenterology;;   GASTRIC ROUX-EN-Y N/A 02/18/2021   Procedure: LAPAROSCOPIC ROUX-EN-Y GASTRIC BYPASS WITH UPPER ENDOSCOPY,;  Surgeon: Sheliah Hatch Hector Blanch, MD;  Location: [facility];  Service: General;  Laterality: N/A;   GASTROINTESTINAL STENT REMOVAL  01/23/2022   Procedure: Daine Gip STENT REMOVAL;  Surgeon: Lemar Lofty., MD;  Location[facility] ENDOSCOPY;  Service: Gastroenterology;;   HEMORRHOID SURGERY  08/25/1997   IR GJ TUBE CHANGE  03/27/2021   LAPAROSCOPIC INSERTION GASTROSTOMY TUBE N/A 03/20/2021   Procedure: LAPAROSCOPIC INSERTION GASTROSTOMY TUBE;  Surgeon: Rodman Pickle, MD;  Location: [facility];  Service: General;  Laterality: N/A;   LEFT HEART CATH AND CORONARY ANGIOGRAPHY N/A 04/15/2021   Procedure: LEFT HEART CATH AND CORONARY ANGIOGRAPHY;  Surgeon: Runell Gess, MD;  [facility];  Service: Cardiovascular;  Laterality:  N/A;   MOUTH SURGERY     removed area which was benign   STENT REMOVAL  07/08/2021   Procedure: STENT REMOVAL;  Surgeon: Lemar Lofty., MD;  Location: [facility];  Service: Gastroenterology;;   Francine Graven REMOVAL  10/16/2021   Procedure: STENT REMOVAL;  Surgeon: Lemar Lofty., MD;  Location: [facility];  Service: Gastroenterology;;   Francine Graven REMOVAL  08/14/2022   Procedure: STENT REMOVAL;  Surgeon: Lemar Lofty., MD;  Location: [facility];  Service: Gastroenterology;;   Francine Graven REMOVAL  11/28/2022   Procedure: STENT REMOVAL;  Surgeon: Lemar Lofty., MD;  Location: [facility] ENDOSCOPY;  Service: Gastroenterology;;   STERIOD INJECTION  01/23/2022   Procedure: STEROID INJECTION;  Surgeon: Lemar Lofty., MD;  [facility];  Service: Gastroenterology;;   SUBMUCOSAL INJECTION  11/18/2021   Procedure: SUBMUCOSAL INJECTION;  Surgeon: Lemar Lofty., MD;  Location: [facility];  Service: Gastroenterology;;   SUBMUCOSAL INJECTION  08/14/2022   Procedure: SUBMUCOSAL INJECTION;  Surgeon: Lemar Lofty., MD;  Location: [facility];  Service: Gastroenterology;;   TONSILLECTOMY     UPPER GI ENDOSCOPY N/A 03/20/2021   Procedure: UPPER GI ENDOSCOPY;  Surgeon: Kinleigh Nault, Hector Blanch, MD;  Location: [facility];  Service: General;  Laterality: N/A;    Family History  Problem Relation Age of Onset   Hypertension Mother    Diabetes Mother    Heart attack Father 73   CAD Father    Hypertension Sister    Diabetes Other    Hypertension Other  Heart disease Other    Colon cancer Neg Hx    Esophageal cancer Neg Hx    Pancreatic cancer Neg Hx    Stomach cancer Neg Hx    Liver disease Neg Hx    Inflammatory bowel disease Neg Hx    Rectal cancer Neg Hx     Social History:  reports that he quit smoking about 33 years ago. His smoking use included cigarettes. He has a 15.00 pack-year smoking history. He has never used smokeless tobacco. He  reports current alcohol use. He reports that he does not use drugs.  Allergies:  Allergies  Allergen Reactions   Other Itching and Other (See Comments)    Cats Wheezing, runny nose/eyes, congestion     Medications: I have reviewed the patient's current medications.  Results for orders placed or performed during the hospital encounter of 12/02/22 (from the past 48 hour(s))  Glucose, capillary     Status: Abnormal   Collection Time: 12/02/22  7:49 AM  Result Value Ref Range   Glucose-Capillary 153 (H) 70 - 99 mg/dL    Comment: Glucose reference range applies only to samples taken after fasting for at least 8 hours.   Comment 1 Notify RN    Comment 2 Document in Chart     No results found.  Review of Systems  Constitutional: Negative.   HENT: Negative.    Eyes: Negative.   Respiratory: Negative.    Cardiovascular: Negative.   Gastrointestinal: Negative.   Genitourinary: Negative.   Musculoskeletal: Negative.   Skin: Negative.   Neurological: Negative.   Endo/Heme/Allergies: Negative.   Psychiatric/Behavioral: Negative.      PE Blood pressure 132/88, pulse (!) 59, temperature 97.7 F (36.5 C), temperature source Oral, resp. rate 15, height 6\' 2"  (1.88 m), weight 103 kg, SpO2 96 %. Constitutional: NAD; conversant; no deformities Eyes: Moist conjunctiva; no lid lag; anicteric; PERRL Neck: Trachea midline; no thyromegaly Lungs: Normal respiratory effort; no tactile fremitus CV: RRR; no palpable thrills; no pitting edema GI: Abd soft, NT; no palpable hepatosplenomegaly MSK: Normal gait; no clubbing/cyanosis Psychiatric: Appropriate affect; alert and oriented x3 Lymphatic: No palpable cervical or axillary lymphadenopathy Skin: No major subcutaneous nodules. Warm and dry   Assessment/Plan: 62 yo male with GJ stricture after RNY gastric bypass, stent removed 4 days ago. -lap gastric bypass reversal -admit to surgical inpatient floor post op  I reviewed last 24 h vitals  and pain scores, last 48 h intake and output, last 24 h labs and trends, and last 24 h imaging results.  This care required high  level of medical decision making.   Hector Curry 12/02/2022, 8:46 AM

## 2022-12-02 NOTE — Anesthesia Postprocedure Evaluation (Signed)
Anesthesia Post Note  Patient: Hector Curry  Procedure(s) Performed: LAPAROSCOPIC converted to OPEN GASTRIC BYPASS REVERSAL, TAKEDOWN OF GASTROJEJUNAL ANASTOMOSIS, GASTRO GASTRIC ANASTOMOSIS WITH UPPER ENDOSCOPY, laparoscopic lysis of adhesions (partial gastrectomy, small bowel resection, esophageal gastrectomy) INSERTION OF GASTROSTOMY TUBE (Abdomen)     Patient location during evaluation: PACU Anesthesia Type: General Level of consciousness: awake and alert and oriented Pain management: pain level controlled Vital Signs Assessment: post-procedure vital signs reviewed and stable Respiratory status: spontaneous breathing, nonlabored ventilation and respiratory function stable Cardiovascular status: blood pressure returned to baseline and stable Postop Assessment: no apparent nausea or vomiting Anesthetic complications: no   No notable events documented.  Last Vitals:  Vitals:   12/02/22 1515 12/02/22 1530  BP: (!) 142/81 (!) 145/85  Pulse: 67 74  Resp: 15 10  Temp:    SpO2: 94% 93%    Last Pain:  Vitals:   12/02/22 1530  TempSrc:   PainSc: Asleep                 Particia Strahm A.

## 2022-12-03 ENCOUNTER — Encounter (HOSPITAL_COMMUNITY): Payer: Self-pay | Admitting: General Surgery

## 2022-12-03 ENCOUNTER — Inpatient Hospital Stay (HOSPITAL_COMMUNITY): Payer: 59

## 2022-12-03 LAB — CBC WITH DIFFERENTIAL/PLATELET
Abs Immature Granulocytes: 0.08 10*3/uL — ABNORMAL HIGH (ref 0.00–0.07)
Basophils Absolute: 0 10*3/uL (ref 0.0–0.1)
Basophils Relative: 0 %
Eosinophils Absolute: 0 10*3/uL (ref 0.0–0.5)
Eosinophils Relative: 0 %
HCT: 43.8 % (ref 39.0–52.0)
Hemoglobin: 14.4 g/dL (ref 13.0–17.0)
Immature Granulocytes: 1 %
Lymphocytes Relative: 6 %
Lymphs Abs: 1 10*3/uL (ref 0.7–4.0)
MCH: 30.3 pg (ref 26.0–34.0)
MCHC: 32.9 g/dL (ref 30.0–36.0)
MCV: 92 fL (ref 80.0–100.0)
Monocytes Absolute: 0.9 10*3/uL (ref 0.1–1.0)
Monocytes Relative: 5 %
Neutro Abs: 15.4 10*3/uL — ABNORMAL HIGH (ref 1.7–7.7)
Neutrophils Relative %: 88 %
Platelets: 167 10*3/uL (ref 150–400)
RBC: 4.76 MIL/uL (ref 4.22–5.81)
RDW: 13.1 % (ref 11.5–15.5)
WBC: 17.5 10*3/uL — ABNORMAL HIGH (ref 4.0–10.5)
nRBC: 0 % (ref 0.0–0.2)

## 2022-12-03 MED ORDER — IOHEXOL 300 MG/ML  SOLN
50.0000 mL | Freq: Once | INTRAMUSCULAR | Status: AC | PRN
  Administered 2022-12-03: 50 mL via ORAL

## 2022-12-03 NOTE — Progress Notes (Signed)
Patient has home CPAP unit with him this admission. Wife at bedside and both have conveyed to me that CPAP will not be utilized tonight due to their understanding from the surgeon to hold CPAP for now. RT will continue to follow and assist with home unit when needed.

## 2022-12-03 NOTE — Progress Notes (Addendum)
  1 Day Post-Op   Chief Complaint/Subjective: Pain is moderately controlled. No nausea or spitting  Objective: Vital signs in last 24 hours: Temp:  [97.7 F (36.5 C)-98.9 F (37.2 C)] 98.4 F (36.9 C) (04/10 0533) Pulse Rate:  [59-77] 71 (04/10 0533) Resp:  [10-19] 18 (04/10 0533) BP: (115-153)/(72-90) 115/72 (04/10 0533) SpO2:  [93 %-100 %] 96 % (04/10 0533) FiO2 (%):  [21 %] 21 % (04/09 2141) Weight:  [103 kg] 103 kg (04/09 0800) Last BM Date : 12/02/22 Intake/Output from previous day: 04/09 0701 - 04/10 0700 In: 4211.1 [I.V.:3561.1; IV Piggyback:650] Out: 1680 [Urine:1360; Drains:170; Blood:150]  PE: Gen: NAD Resp: nonlabored Card: RRR Abd: soft, incisions c/d/I, drain with serosanguinous output  Lab Results:  Recent Labs    12/02/22 1651 12/02/22 1922 12/03/22 0433  WBC 13.9*  --  17.5*  HGB 15.7 15.0 14.4  HCT 47.7 45.2 43.8  PLT 159  --  167   Recent Labs    12/02/22 1651  CREATININE 0.82   No results for input(s): "LABPROT", "INR" in the last 72 hours.    Component Value Date/Time   NA 139 11/19/2022 1452   K 3.5 11/19/2022 1452   CL 104 11/19/2022 1452   CO2 26 11/19/2022 1452   GLUCOSE 126 (H) 11/19/2022 1452   BUN 9 11/19/2022 1452   CREATININE 0.82 12/02/2022 1651   CALCIUM 8.8 (L) 11/19/2022 1452   PROT 6.8 11/19/2022 1452   ALBUMIN 3.9 11/19/2022 1452   AST 23 11/19/2022 1452   ALT 37 11/19/2022 1452   ALKPHOS 105 11/19/2022 1452   BILITOT 1.0 11/19/2022 1452   GFRNONAA >60 12/02/2022 1651   GFRAA >60 05/22/2020 0827    Assessment/Plan  s/p Procedure(s): LAPAROSCOPIC converted to OPEN GASTRIC BYPASS REVERSAL, TAKEDOWN OF GASTROJEJUNAL ANASTOMOSIS, GASTRO GASTRIC ANASTOMOSIS WITH UPPER ENDOSCOPY 1. Laparoscopic lysis of adhesions 2. Partial gastrectomy 3. Small bowel resection 4. Creation of esophagus to gastric anastomosis 5. Creation of gastrostomy INSERTION OF GASTROSTOMY TUBE 12/02/2022    FEN - NPO, UGI today VTE - lovenox ID -  periop coverage Disposition - inpatient, clear liquids if UGI looks good, ambulate   LOS: 1 day   I reviewed last 24 h vitals and pain scores, last 48 h intake and output, last 24 h labs and trends, and last 24 h imaging results.  This care required high  level of medical decision making.   De Blanch [facility] at [facility] 12/03/2022, 7:26 AM Please see Amion for pager number during day hours 7:00am-4:30pm or 7:00am -11:30am on weekends  Addendum - UGI negative for leak with good passage of contrast into stomach. I updated the wife over the phone. -bariatric clear liquids

## 2022-12-04 ENCOUNTER — Inpatient Hospital Stay (HOSPITAL_COMMUNITY): Payer: 59

## 2022-12-04 LAB — SURGICAL PATHOLOGY

## 2022-12-04 LAB — GLUCOSE, CAPILLARY
Glucose-Capillary: 202 mg/dL — ABNORMAL HIGH (ref 70–99)
Glucose-Capillary: 181 mg/dL — ABNORMAL HIGH (ref 70–99)
Glucose-Capillary: 198 mg/dL — ABNORMAL HIGH (ref 70–99)

## 2022-12-04 LAB — CBC WITH DIFFERENTIAL/PLATELET
Abs Immature Granulocytes: 0.06 10*3/uL (ref 0.00–0.07)
Basophils Absolute: 0 10*3/uL (ref 0.0–0.1)
Basophils Relative: 0 %
Eosinophils Absolute: 0.1 10*3/uL (ref 0.0–0.5)
Eosinophils Relative: 1 %
HCT: 40.8 % (ref 39.0–52.0)
Hemoglobin: 13.4 g/dL (ref 13.0–17.0)
Immature Granulocytes: 1 %
Lymphocytes Relative: 8 %
Lymphs Abs: 0.8 10*3/uL (ref 0.7–4.0)
MCH: 30.6 pg (ref 26.0–34.0)
MCHC: 32.8 g/dL (ref 30.0–36.0)
MCV: 93.2 fL (ref 80.0–100.0)
Monocytes Absolute: 0.9 10*3/uL (ref 0.1–1.0)
Monocytes Relative: 9 %
Neutro Abs: 8 10*3/uL — ABNORMAL HIGH (ref 1.7–7.7)
Neutrophils Relative %: 81 %
Platelets: 114 10*3/uL — ABNORMAL LOW (ref 150–400)
RBC: 4.38 MIL/uL (ref 4.22–5.81)
RDW: 13.2 % (ref 11.5–15.5)
WBC: 9.8 10*3/uL (ref 4.0–10.5)
nRBC: 0 % (ref 0.0–0.2)

## 2022-12-04 LAB — CBC
HCT: 41.8 % (ref 39.0–52.0)
Hemoglobin: 13.2 g/dL (ref 13.0–17.0)
MCH: 29.9 pg (ref 26.0–34.0)
MCHC: 31.6 g/dL (ref 30.0–36.0)
MCV: 94.6 fL (ref 80.0–100.0)
Platelets: 122 10*3/uL — ABNORMAL LOW (ref 150–400)
RBC: 4.42 MIL/uL (ref 4.22–5.81)
RDW: 13.3 % (ref 11.5–15.5)
WBC: 8.9 10*3/uL (ref 4.0–10.5)
nRBC: 0 % (ref 0.0–0.2)

## 2022-12-04 LAB — BASIC METABOLIC PANEL
Anion gap: 18 — ABNORMAL HIGH (ref 5–15)
BUN: <5 mg/dL — ABNORMAL LOW (ref 6–20)
CO2: 25 mmol/L (ref 22–32)
Calcium: 8.5 mg/dL — ABNORMAL LOW (ref 8.9–10.3)
Chloride: 91 mmol/L — ABNORMAL LOW (ref 98–111)
Creatinine, Ser: 0.57 mg/dL — ABNORMAL LOW (ref 0.61–1.24)
GFR, Estimated: >60 mL/min (ref >60)
Glucose, Bld: 187 mg/dL — ABNORMAL HIGH (ref 70–99)
Potassium: 3.5 mmol/L (ref 3.5–5.1)
Sodium: 134 mmol/L — ABNORMAL LOW (ref 135–145)

## 2022-12-04 MED ORDER — PROPRANOLOL HCL 10 MG PO TABS
20.0000 mg | ORAL_TABLET | Freq: Three times a day (TID) | ORAL | Status: DC
  Administered 2022-12-04 – 2022-12-05 (×6): 20 mg via ORAL
  Filled 2022-12-04 (×6): qty 2

## 2022-12-04 MED ORDER — OXYCODONE HCL 5 MG/5ML PO SOLN
5.0000 mg | ORAL | Status: DC | PRN
  Administered 2022-12-04 – 2022-12-06 (×2): 5 mg via ORAL
  Filled 2022-12-04 (×3): qty 5

## 2022-12-04 MED ORDER — ENSURE ENLIVE PO LIQD
237.0000 mL | Freq: Two times a day (BID) | ORAL | Status: DC
  Administered 2022-12-04 – 2022-12-05 (×4): 237 mL via ORAL

## 2022-12-04 MED ORDER — ACETAMINOPHEN 160 MG/5ML PO SOLN
500.0000 mg | Freq: Four times a day (QID) | ORAL | Status: DC | PRN
  Administered 2022-12-04 – 2022-12-05 (×3): 500 mg via ORAL
  Filled 2022-12-04 (×3): qty 20.3

## 2022-12-04 MED ORDER — INSULIN ASPART 100 UNIT/ML IJ SOLN
0.0000 [IU] | Freq: Three times a day (TID) | INTRAMUSCULAR | Status: DC
  Administered 2022-12-04: 3 [IU] via SUBCUTANEOUS
  Administered 2022-12-04: 5 [IU] via SUBCUTANEOUS
  Administered 2022-12-05 (×2): 3 [IU] via SUBCUTANEOUS

## 2022-12-04 MED ORDER — PROPRANOLOL HCL 20 MG/5ML PO SOLN
20.0000 mg | Freq: Three times a day (TID) | ORAL | Status: DC
  Filled 2022-12-04 (×2): qty 5

## 2022-12-04 MED ORDER — ASPIRIN 81 MG PO CHEW
81.0000 mg | CHEWABLE_TABLET | Freq: Every day | ORAL | Status: DC
  Administered 2022-12-04 – 2022-12-05 (×2): 81 mg via ORAL
  Filled 2022-12-04 (×2): qty 1

## 2022-12-04 NOTE — Progress Notes (Signed)
  2 Days Post-Op   Chief Complaint/Subjective: Tolerating clear liquids, pain moderate, ambulating well, no flatus.  Objective: Vital signs in last 24 hours: Temp:  [98.1 F (36.7 C)-99.6 F (37.6 C)] 99.6 F (37.6 C) (04/11 0616) Pulse Rate:  [77-87] 87 (04/11 0616) Resp:  [18] 18 (04/11 0616) BP: (127-142)/(79-81) 142/80 (04/11 0616) SpO2:  [93 %-96 %] 94 % (04/11 0616) Last BM Date : 12/01/22 Intake/Output from previous day: 04/10 0701 - 04/11 0700 In: 3085.6 [P.O.:840; I.V.:2145.6; IV Piggyback:100] Out: 3085 [Urine:2950; Drains:135]  PE: Gen: NAD Resp: nonlabored Card: RRR Abd: soft, incisions c/d/I, drain with serosanguinous output  Lab Results:  Recent Labs    12/03/22 0433 12/04/22 0445  WBC 17.5* 9.8  HGB 14.4 13.4  HCT 43.8 40.8  PLT 167 114*   Recent Labs    12/02/22 1651  CREATININE 0.82   No results for input(s): "LABPROT", "INR" in the last 72 hours.    Component Value Date/Time   NA 139 11/19/2022 1452   K 3.5 11/19/2022 1452   CL 104 11/19/2022 1452   CO2 26 11/19/2022 1452   GLUCOSE 126 (H) 11/19/2022 1452   BUN 9 11/19/2022 1452   CREATININE 0.82 12/02/2022 1651   CALCIUM 8.8 (L) 11/19/2022 1452   PROT 6.8 11/19/2022 1452   ALBUMIN 3.9 11/19/2022 1452   AST 23 11/19/2022 1452   ALT 37 11/19/2022 1452   ALKPHOS 105 11/19/2022 1452   BILITOT 1.0 11/19/2022 1452   GFRNONAA >60 12/02/2022 1651   GFRAA >60 05/22/2020 0827    Assessment/Plan  s/p Procedure(s): LAPAROSCOPIC converted to OPEN GASTRIC BYPASS REVERSAL, TAKEDOWN OF GASTROJEJUNAL ANASTOMOSIS, GASTRO GASTRIC ANASTOMOSIS WITH UPPER ENDOSCOPY 1. Laparoscopic lysis of adhesions 2. Partial gastrectomy 3. Small bowel resection 4. Creation of esophagus to gastric anastomosis 5. Creation of gastrostomy INSERTION OF GASTROSTOMY TUBE 12/02/2022    FEN - adv to full liquids, ensure, continue to clamp G tube, saline lock IV VTE - lovenox ID - periop abx Disposition - inpatient, adding  liquid version of home meds  OSA - plan to resume CPAP POD 7   LOS: 2 days   I reviewed last 24 h vitals and pain scores, last 48 h intake and output, last 24 h labs and trends, and last 24 h imaging results.  This care required high  level of medical decision making.   De Blanch [facility] at [facility] 12/04/2022, 9:58 AM Please see Amion for pager number during day hours 7:00am-4:30pm or 7:00am -11:30am on weekends

## 2022-12-04 NOTE — Plan of Care (Addendum)
A/ox4. Denied any n/v. Been up ambulating from bed to bathroom throughout the night. Voiding adequately. No BM over night. JP intact and to bulb suction. Abd dressing intact with scant serosanguineous drainage.  Received pain meds once. Pt resting at the moment will continue to monitor.    Problem: Education: Goal: Knowledge of General Education information will improve Description: Including pain rating scale, medication(s)/side effects and non-pharmacologic comfort measures Outcome: Progressing   Problem: Clinical Measurements: Goal: Ability to maintain clinical measurements within normal limits will improve Outcome: Progressing Goal: Will remain free from infection Outcome: Progressing Goal: Diagnostic test results will improve Outcome: Progressing Goal: Respiratory complications will improve Outcome: Progressing Goal: Cardiovascular complication will be avoided Outcome: Progressing   Problem: Activity: Goal: Risk for activity intolerance will decrease Outcome: Progressing   Problem: Nutrition: Goal: Adequate nutrition will be maintained Outcome: Progressing   Problem: Pain Managment: Goal: General experience of comfort will improve Outcome: Progressing   Problem: Skin Integrity: Goal: Risk for impaired skin integrity will decrease Outcome: Progressing

## 2022-12-05 LAB — CBC
HCT: 41.6 % (ref 39.0–52.0)
Hemoglobin: 13.6 g/dL (ref 13.0–17.0)
MCH: 30.4 pg (ref 26.0–34.0)
MCHC: 32.7 g/dL (ref 30.0–36.0)
MCV: 93.1 fL (ref 80.0–100.0)
Platelets: 115 10*3/uL — ABNORMAL LOW (ref 150–400)
RBC: 4.47 MIL/uL (ref 4.22–5.81)
RDW: 13.4 % (ref 11.5–15.5)
WBC: 7.4 10*3/uL (ref 4.0–10.5)
nRBC: 0 % (ref 0.0–0.2)

## 2022-12-05 LAB — BASIC METABOLIC PANEL
Anion gap: 9 (ref 5–15)
BUN: <5 mg/dL — ABNORMAL LOW (ref 6–20)
CO2: 28 mmol/L (ref 22–32)
Calcium: 8.6 mg/dL — ABNORMAL LOW (ref 8.9–10.3)
Chloride: 96 mmol/L — ABNORMAL LOW (ref 98–111)
Creatinine, Ser: 0.61 mg/dL (ref 0.61–1.24)
GFR, Estimated: >60 mL/min (ref >60)
Glucose, Bld: 191 mg/dL — ABNORMAL HIGH (ref 70–99)
Potassium: 3.8 mmol/L (ref 3.5–5.1)
Sodium: 133 mmol/L — ABNORMAL LOW (ref 135–145)

## 2022-12-05 LAB — GLUCOSE, CAPILLARY
Glucose-Capillary: 181 mg/dL — ABNORMAL HIGH (ref 70–99)
Glucose-Capillary: 195 mg/dL — ABNORMAL HIGH (ref 70–99)

## 2022-12-05 LAB — MAGNESIUM: Magnesium: 2 mg/dL (ref 1.7–2.4)

## 2022-12-05 LAB — PHOSPHORUS: Phosphorus: 2.9 mg/dL (ref 2.5–4.6)

## 2022-12-05 NOTE — Progress Notes (Signed)
PHARMACY CONSULT FOR:  Risk Assessment for Post-Discharge VTE Following Bariatric Surgery  Post-Discharge VTE Risk Assessment: This patient's probability of 30-day post-discharge VTE is increased due to the factors marked:  Sleeve gastrectomy   Liver disorder (transplant, cirrhosis, or nonalcoholic steatohepatitis)   Hx of VTE   Hemorrhage requiring transfusion   GI perforation, leak, or obstruction   ====================================================   x Male   x Age >/=60 years    BMI >/=50 kg/m2    CHF    Dyspnea at Rest    Paraplegia  x  Non-gastric-band surgery   x Operation Time >/=3 hr    Return to OR    x Length of Stay >/= 3 d   Hypercoagulable condition   Significant venous stasis      Predicted probability of 30-day post-discharge VTE: 1.46%  **Patient here for gastric bypass reversal. Per discussion with Dr. Sheliah Hatch on 4/9, patient likely does not qualify for post-discharge bariatric VTE prophylaxis algorithm given procedure.  However, if indicated please see recommendation below based on above risk factors in patient normally following bariatric surgery algorithm. Risk being recalculated today as LOS now >3 midnights.**  Recommendation for Discharge if indicated: Enoxaparin 40 mg [place] q12h x 4 weeks post-discharge   Hector Curry is a 62 y.o. male who underwent gastric bypass reversal on 4/9   Case start: 1012 Case end: 1432   Allergies  Allergen Reactions   Other Itching and Other (See Comments)    Cats Wheezing, runny nose/eyes, congestion     Patient Measurements: Height: 6\' 2"  (188 cm) Weight: 103 kg (227 lb 1.2 oz) IBW/kg (Calculated) : 82.2 Body mass index is 29.15 kg/m.  Recent Labs    12/02/22 1651 12/02/22 1922 12/04/22 0445 12/04/22 1601 12/05/22 0540 12/05/22 0639  WBC 13.9*   < > 9.8 8.9 7.4  --   HGB 15.7   < > 13.4 13.2 13.6  --   HCT 47.7   < > 40.8 41.8 41.6  --   PLT 159   < > 114* 122* 115*  --   CREATININE 0.82   --   --  0.57* 0.61  --   MG  --   --   --   --   --  2.0  PHOS  --   --   --   --   --  2.9   < > = values in this interval not displayed.   Estimated Creatinine Clearance: 125.7 mL/min (by C-G formula based on SCr of 0.61 mg/dL).    Past Medical History:  Diagnosis Date   Allergic rhinitis    Atrial tachycardia    CAD (coronary artery disease)    a. 05/08/2015 Cath: no significant CAD, LVEF nl-->Med; b. 07/2017 MV: attenuation artifact, no ischemia, EF 65%-->Low risk; c. 03/2021 NSTEMI/PCI: LM nl, LAD 80p (3.0x18 Onyx Frontier DES), D1 90 (too small for PCI), LCX nl, RCA nl.   Diabetes mellitus without complication    Diastolic dysfunction    a. 04/2015 Echo: EF 60-65%; b. 05/2016 Echo: EF 60-65%, GrI DD; c. 07/2017 Echo: EF 55-60%, Ao root 32mm; d. 03/2019 Echo: EF 55-60%, Ao root/Asc Ao 65mm; e. 03/2021 Echo: EF 50-55%, apical HK, mod asymm basal-septal hypertrophy. Nl RV fxn. Asc Ao 72mm.   Dilated aortic root    a. 07/2017 Echo: 94mm Ao root - mildly dil; b. 03/2019 Echo: Ao root 27mm; c. 03/2021 Echo: Asc Ao 51mm.   Diverticulosis 10/03/2014   Dysrhythmia  Esophageal stricture    a.  In setting of lap band June 2022-stricture at the GJ anastomosis requiring gastrostomy tube; b. 05/2021 s/p esoph stenting (15mm); c. 07/2021 s/p esoph stenting (20mm).   Family history of premature CAD    a. father passed from MI at 60   GERD (gastroesophageal reflux disease)    GIB (gastrointestinal bleeding)    a. 07/2021 following esoph stenting.   Hemorrhoids    a. internal hemorrhoids s/p surgery 1999   History of kidney stones    History of tobacco abuse    Hyperlipidemia    Hyperplastic colon polyp 10/03/2014   a. x 2    Hypertension    Inflammatory arthritis    a. CCP antibodies & x-rays negative. Rheumatoid factor 14, felt to be crystaline over RA or psoriatic   Iron deficiency anemia 02/05/2022   Morbid obesity    a. s/p LAP-BAND - complicated by esoph stricture.   Myocardial  infarction    NSTEMI   OSA (obstructive sleep apnea)    a. on CPAP   Osteoarthritis    PSVT (paroxysmal supraventricular tachycardia)    a. 48 hr Holter 04/2015: NSR w/ rare PVC, short runs of narrow complex tachycardiac, possible atrial tach, longest run 7 beats, PACs noted (2% of all beats 3600 total) they did not seem to correlate w/ significant arrythmia; b. 08/2017 Event monitor: no significant arrhythmias.     Medications Prior to Admission  Medication Sig Dispense Refill Last Dose   aspirin EC 81 MG tablet Take 1 tablet (81 mg total) by mouth daily. Use while holding Plavix. 150 tablet 2 12/01/2022   atorvastatin (LIPITOR) 10 MG tablet Take 1 tablet (10 mg total) by mouth daily. 90 tablet 0 12/01/2022   Continuous Blood Gluc Sensor (FREESTYLE LIBRE 14 DAY SENSOR) MISC Insert 1 sensor to the back of the arm as directed every 14 days for continuous glucose monitoring 6 each 1 12/01/2022   ferrous sulfate 325 (65 FE) MG tablet Take 1 tablet by mouth 2 times daily with a meal. (Patient taking differently: Take 325 mg by mouth daily.) 600 tablet 0 12/01/2022   fluticasone (FLONASE) 50 MCG/ACT nasal spray Place 2-3 sprays into both nostrils daily as needed for allergies or rhinitis.   11/18/2022   insulin lispro (HUMALOG) 100 UNIT/ML KwikPen Inject subcutaneously 3 (three) times daily with meals Per sliding scale 15 mL 1 Past Month   loratadine (CLARITIN) 10 MG tablet Take 10 mg by mouth daily.   12/01/2022   METOPROLOL SUCCINATE PO Take by mouth.   12/01/2022 at 0730   mometasone (ELOCON) 0.1 % lotion Apply to affected areas once daily (Patient taking differently: Apply 1 Application topically as needed (psoriasis).) 60 mL 5 Past Month   Multiple Vitamin (MULTI-VITAMIN DAILY PO) Take 2 tablets by mouth daily. Bariartic   12/01/2022   nitroGLYCERIN (NITROSTAT) 0.4 MG SL tablet Place 1 tablet (0.4 mg total) under the tongue every 5 (five) minutes as needed for chest pain. 25 tablet 3 has not used, not needed    pantoprazole (PROTONIX) 40 MG tablet Take 1 tablet (40 mg total) by mouth 2 (two) times daily before a meal. 60 tablet 12 12/01/2022   PRESCRIPTION MEDICATION at bedtime. Bipap   12/01/2022   propranolol (INDERAL) 20 MG tablet Take 1 tablet (20 mg total) by mouth every 8 (eight) hours. (Patient taking differently: Take 20 mg by mouth daily.) 270 tablet 3 12/01/2022 at 2100   Propylene Glycol (SYSTANE BALANCE  OP) Place 2-3 drops into both eyes daily as needed (dry eyes).   Past Week   Pseudoeph-Doxylamine-DM-APAP (NYQUIL PO) Take 2 tablets by mouth at bedtime as needed (pain).   12/01/2022   sucralfate (CARAFATE) 1 g tablet Take 1 tablet (1 g total) by mouth 2 (two) times daily. 120 tablet 1 12/01/2022   VITAMIN E PO Take 1 tablet by mouth daily.   12/01/2022   clopidogrel (PLAVIX) 75 MG tablet Take 1 tablet (75 mg total) by mouth daily. 90 tablet 3 11/22/2022   Potassium Chloride ER 20 MEQ TBCR Take 1 tablet by mouth 2 times daily (Patient not taking: Reported on 11/19/2022) 60 tablet 5 Not Taking     Cindi Carbon, PharmD, BCPS Clinical Pharmacist 12/05/2022 12:10 PM

## 2022-12-05 NOTE — TOC CM/SW Note (Signed)
Transition of Care [facility]) Screening Note  Patient Details  Name: EDRIK STOOPS Date of Birth: May 23, 1961  Transition of Care [facility]) CM/SW Contact:    Ewing Schlein, LCSW Phone Number: 12/05/2022, 10:19 AM  Transition of Care Department [facility]) has reviewed patient and no TOC needs have been identified at this time. We will continue to monitor patient advancement through interdisciplinary progression rounds. If new patient transition needs arise, please place a TOC consult.

## 2022-12-05 NOTE — Progress Notes (Signed)
  3 Days Post-Op   Chief Complaint/Subjective: Feeling better today, fever subsided, tolerated ensure  Objective: Vital signs in last 24 hours: Temp:  [97.7 F (36.5 C)-100.9 F (38.3 C)] 97.7 F (36.5 C) (04/12 1322) Pulse Rate:  [74-83] 74 (04/12 1322) Resp:  [16-18] 18 (04/12 0542) BP: (115-141)/(74-82) 115/77 (04/12 1322) SpO2:  [92 %-97 %] 97 % (04/12 1322) FiO2 (%):  [21 %] 21 % (04/11 2245) Last BM Date : 12/01/22 Intake/Output from previous day: 04/11 0701 - 04/12 0700 In: 2678 [P.O.:480; I.V.:2098; IV Piggyback:100] Out: 1959 [Urine:1925; Drains:35]  PE: Gen: NAd Resp: nonlabored Card: RRR Abd: incisions c/d/I, drain with serosanguinous output  Lab Results:  Recent Labs    12/04/22 1601 12/05/22 0540  WBC 8.9 7.4  HGB 13.2 13.6  HCT 41.8 41.6  PLT 122* 115*   Recent Labs    12/04/22 1601 12/05/22 0540  NA 134* 133*  K 3.5 3.8  CL 91* 96*  CO2 25 28  GLUCOSE 187* 191*  BUN <5* <5*  CREATININE 0.57* 0.61  CALCIUM 8.5* 8.6*   No results for input(s): "LABPROT", "INR" in the last 72 hours.    Component Value Date/Time   NA 133 (L) 12/05/2022 0540   K 3.8 12/05/2022 0540   CL 96 (L) 12/05/2022 0540   CO2 28 12/05/2022 0540   GLUCOSE 191 (H) 12/05/2022 0540   BUN <5 (L) 12/05/2022 0540   CREATININE 0.61 12/05/2022 0540   CALCIUM 8.6 (L) 12/05/2022 0540   PROT 6.8 11/19/2022 1452   ALBUMIN 3.9 11/19/2022 1452   AST 23 11/19/2022 1452   ALT 37 11/19/2022 1452   ALKPHOS 105 11/19/2022 1452   BILITOT 1.0 11/19/2022 1452   GFRNONAA >60 12/05/2022 0540   GFRAA >60 05/22/2020 0827    Assessment/Plan  s/p Procedure(s): LAPAROSCOPIC converted to OPEN GASTRIC BYPASS REVERSAL, TAKEDOWN OF GASTROJEJUNAL ANASTOMOSIS, GASTRO GASTRIC ANASTOMOSIS WITH UPPER ENDOSCOPY 1. Laparoscopic lysis of adhesions 2. Partial gastrectomy 3. Small bowel resection 4. Creation of esophagus to gastric anastomosis 5. Creation of gastrostomy INSERTION OF GASTROSTOMY TUBE  12/02/2022    FEN - full liquids and ensure VTE - lovenox ID - no issues Disposition - inpatient   LOS: 3 days   I reviewed last 24 h vitals and pain scores, last 48 h intake and output, last 24 h labs and trends, and last 24 h imaging results.  This care required high  level of medical decision making.   De Blanch [facility] 12/05/2022, 7:19 PM Please see Amion for pager number during day hours 7:00am-4:30pm or 7:00am -11:30am on weekends

## 2022-12-05 NOTE — Progress Notes (Signed)
Pt is refusing to let staff do CBG, is insisting that staff use his reading from his Freestyle Libra device readings to give his SSI coverage. Instructed pt on P/P on this, MD notified of above.

## 2022-12-06 LAB — CBC
HCT: 41.6 % (ref 39.0–52.0)
Hemoglobin: 13.7 g/dL (ref 13.0–17.0)
MCH: 30.4 pg (ref 26.0–34.0)
MCHC: 32.9 g/dL (ref 30.0–36.0)
MCV: 92.2 fL (ref 80.0–100.0)
Platelets: 142 10*3/uL — ABNORMAL LOW (ref 150–400)
RBC: 4.51 MIL/uL (ref 4.22–5.81)
RDW: 13.2 % (ref 11.5–15.5)
WBC: 7.3 10*3/uL (ref 4.0–10.5)
nRBC: 0 % (ref 0.0–0.2)

## 2022-12-06 LAB — BASIC METABOLIC PANEL
Anion gap: 9 (ref 5–15)
BUN: 7 mg/dL (ref 6–20)
CO2: 30 mmol/L (ref 22–32)
Calcium: 8.7 mg/dL — ABNORMAL LOW (ref 8.9–10.3)
Chloride: 97 mmol/L — ABNORMAL LOW (ref 98–111)
Creatinine, Ser: 0.63 mg/dL (ref 0.61–1.24)
GFR, Estimated: >60 mL/min (ref >60)
Glucose, Bld: 185 mg/dL — ABNORMAL HIGH (ref 70–99)
Potassium: 3.2 mmol/L — ABNORMAL LOW (ref 3.5–5.1)
Sodium: 136 mmol/L (ref 135–145)

## 2022-12-06 NOTE — Discharge Summary (Signed)
Physician Discharge Summary  [name]:[id number] DOB: 1961-06-18 DOA: 12/02/2022  PCP: Hector Arbour, MD  Admit date: 12/02/2022 Discharge date:  12/06/2022   Recommendations for Outpatient Follow-up:  none (include homehealth, outpatient follow-up instructions, specific recommendations for PCP to follow-up on, etc.)   Discharge Diagnoses:  Principal Problem:   Gastrojejunal anastomotic stricture   Surgical Procedure: partial gastrectomy and esophagectomy with E-G anastomosis, g tube, small bowel resection  Discharge Condition: Good Disposition: Home  Diet recommendation: full liquid diet   Hospital Course:  62 yo male with long history of GJ stricture after RNY gastric bypass presented for bypass reversal. He was admitted to the surgical floor post op. He underwent UGI POD 1 which was negative for leak. He was slowly advanced on diet and discharged home POD 4.  Discharge Instructions  Discharge Instructions     Ambulate hourly while awake   Complete by: As directed    Call MD for:  difficulty breathing, headache or visual disturbances   Complete by: As directed    Call MD for:  persistant dizziness or light-headedness   Complete by: As directed    Call MD for:  persistant nausea and vomiting   Complete by: As directed    Call MD for:  redness, tenderness, or signs of infection (pain, swelling, redness, odor or green/yellow discharge around incision site)   Complete by: As directed    Call MD for:  severe uncontrolled pain   Complete by: As directed    Call MD for:  temperature >101 F   Complete by: As directed    Discharge wound care:   Complete by: As directed    Remove Bandaids tomorrow, ok to shower tomorrow. Steristrips may fall off in 1-3 weeks.   Discharge wound care:   Complete by: As directed    Ok to shower. Maintain drain and empty daily, call if volume increases. G tube clamped, can open to vent for bloating.   Incentive spirometry    Complete by: As directed    Perform hourly while awake      Allergies as of 12/06/2022       Reactions   Other Itching, Other (See Comments)   Cats Wheezing, runny nose/eyes, congestion         Medication List     STOP taking these medications    FeroSul 325 (65 FE) MG tablet Generic drug: ferrous sulfate   sucralfate 1 g tablet Commonly known as: Carafate       TAKE these medications    aspirin EC 81 MG tablet Take 1 tablet (81 mg total) by mouth daily. Use while holding Plavix.   atorvastatin 10 MG tablet Commonly known as: LIPITOR Take 1 tablet (10 mg total) by mouth daily.   clopidogrel 75 MG tablet Commonly known as: PLAVIX Take 1 tablet (75 mg total) by mouth daily.   fluticasone 50 MCG/ACT nasal spray Commonly known as: FLONASE Place 2-3 sprays into both nostrils daily as needed for allergies or rhinitis.   FreeStyle Libre 14 Day Sensor Misc Insert 1 sensor to the back of the arm as directed every 14 days for continuous glucose monitoring   insulin lispro 100 UNIT/ML KwikPen Commonly known as: HUMALOG Inject subcutaneously 3 (three) times daily with meals Per sliding scale   loratadine 10 MG tablet Commonly known as: CLARITIN Take 10 mg by mouth daily.   METOPROLOL SUCCINATE PO Take by mouth.   mometasone 0.1 % lotion Commonly known as: ELOCON  Apply to affected areas once daily What changed:  how much to take when to take this reasons to take this   MULTI-VITAMIN DAILY PO Take 2 tablets by mouth daily. Bariartic   nitroGLYCERIN 0.4 MG SL tablet Commonly known as: NITROSTAT Place 1 tablet (0.4 mg total) under the tongue every 5 (five) minutes as needed for chest pain.   NYQUIL PO Take 2 tablets by mouth at bedtime as needed (pain).   pantoprazole 40 MG tablet Commonly known as: PROTONIX Take 1 tablet (40 mg total) by mouth 2 (two) times daily before a meal.   Potassium Chloride ER 20 MEQ Tbcr Take 1 tablet by mouth 2 times  daily   PRESCRIPTION MEDICATION at bedtime. Bipap   propranolol 20 MG tablet Commonly known as: INDERAL Take 1 tablet (20 mg total) by mouth every 8 (eight) hours. What changed: when to take this   SYSTANE BALANCE OP Place 2-3 drops into both eyes daily as needed (dry eyes).   VITAMIN E PO Take 1 tablet by mouth daily.               Discharge Care Instructions  (From admission, onward)           Start     Ordered   12/06/22 0000  Discharge wound care:       Comments: Remove Bandaids tomorrow, ok to shower tomorrow. Steristrips may fall off in 1-3 weeks.   12/06/22 0753   12/06/22 0000  Discharge wound care:       Comments: Ok to shower. Maintain drain and empty daily, call if volume increases. G tube clamped, can open to vent for bloating.   12/06/22 0754              The results of significant diagnostics from this hospitalization (including imaging, microbiology, ancillary and laboratory) are listed below for reference.    Significant Diagnostic Studies: DG CHEST PORT 1 VIEW  Result Date: 12/04/2022 CLINICAL DATA:  Fever and body aches. EXAM: PORTABLE CHEST 1 VIEW COMPARISON:  April 04, 2022 FINDINGS: The heart size and mediastinal contours are within normal limits. Both lungs are clear. The visualized skeletal structures are unremarkable. IMPRESSION: No active disease. Electronically Signed   By: Ted Mcalpine M.D.   On: 12/04/2022 19:01   DG UGI W SINGLE CM (SOL OR THIN BA)  Result Date: 12/03/2022 CLINICAL DATA:  Status post gastric bypass reversal with gastroesophageal anastomosis yesterday. Evaluate for leak. EXAM: UPPER GI SERIES WITH KUB TECHNIQUE: After obtaining a scout radiograph a routine upper GI series was performed using water-soluble contrast. FLUOROSCOPY: Radiation Exposure Index (as provided by the fluoroscopic device): 168.7 mGy Kerma COMPARISON:  None Available. FINDINGS: Single contrast examination was performed with 100 mL  Omnipaque 300 administered orally. Scout x-ray demonstrates surgical drain in the central upper abdomen and gastrostomy tube. Bowel gas pattern is normal. Contrast was swallowed without difficulty and easily traverses the esophagus. Contrast promptly empties through the gastroesophageal anastomosis into the stomach. There is also significant reflux of contrast into the upper esophagus during peristalsis. Some narrowing of the anastomosis is likely to postoperative edema. There is no extraluminal contrast extravasation to suggest a leak. Severe spontaneous reflux of contrast into the upper esophagus occurred with the patient lying down. The duodenum is unremarkable. IMPRESSION: 1. No evidence of a leak. 2. Severe gastroesophageal reflux. Electronically Signed   By: Obie Dredge M.D.   On: 12/03/2022 09:12    Labs: Basic Metabolic Panel: Recent Labs  Lab 12/02/22 1651 12/04/22 1601 12/05/22 0540 12/05/22 0639 12/06/22 0438  NA  --  134* 133*  --  136  K  --  3.5 3.8  --  3.2*  CL  --  91* 96*  --  97*  CO2  --  25 28  --  30  GLUCOSE  --  187* 191*  --  185*  BUN  --  <5* <5*  --  7  CREATININE 0.82 0.57* 0.61  --  0.63  CALCIUM  --  8.5* 8.6*  --  8.7*  MG  --   --   --  2.0  --   PHOS  --   --   --  2.9  --    Liver Function Tests: No results for input(s): "AST", "ALT", "ALKPHOS", "BILITOT", "PROT", "ALBUMIN" in the last 168 hours.  CBC: Recent Labs  Lab 12/02/22 1651 12/02/22 1922 12/03/22 0433 12/04/22 0445 12/04/22 1601 12/05/22 0540 12/06/22 0438  WBC 13.9*  --  17.5* 9.8 8.9 7.4 7.3  NEUTROABS  --   --  15.4* 8.0*  --   --   --   HGB 15.7   < > 14.4 13.4 13.2 13.6 13.7  HCT 47.7   < > 43.8 40.8 41.8 41.6 41.6  MCV 92.8  --  92.0 93.2 94.6 93.1 92.2  PLT 159  --  167 114* 122* 115* 142*   < > = values in this interval not displayed.    CBG: Recent Labs  Lab 12/04/22 1158 12/04/22 1630 12/04/22 2134 12/05/22 0742 12/05/22 1115  GLUCAP 202* 181* 198* 181* 195*     Principal Problem:   Gastrojejunal anastomotic stricture   Time coordinating discharge: 15 min

## 2022-12-06 NOTE — Plan of Care (Signed)
Problem: Education: Goal: Knowledge of General Education information will improve Description: Including pain rating scale, medication(s)/side effects and non-pharmacologic comfort measures Outcome: Adequate for Discharge   Problem: Health Behavior/Discharge Planning: Goal: Ability to manage health-related needs will improve Outcome: Adequate for Discharge   Problem: Clinical Measurements: Goal: Ability to maintain clinical measurements within normal limits will improve Outcome: Adequate for Discharge Goal: Will remain free from infection Outcome: Adequate for Discharge Goal: Diagnostic test results will improve Outcome: Adequate for Discharge Goal: Respiratory complications will improve Outcome: Adequate for Discharge Goal: Cardiovascular complication will be avoided Outcome: Adequate for Discharge   Problem: Activity: Goal: Risk for activity intolerance will decrease Outcome: Adequate for Discharge   Problem: Nutrition: Goal: Adequate nutrition will be maintained Outcome: Adequate for Discharge   Problem: Coping: Goal: Level of anxiety will decrease Outcome: Adequate for Discharge   Problem: Elimination: Goal: Will not experience complications related to bowel motility Outcome: Adequate for Discharge Goal: Will not experience complications related to urinary retention Outcome: Adequate for Discharge   Problem: Pain Managment: Goal: General experience of comfort will improve Outcome: Adequate for Discharge   Problem: Safety: Goal: Ability to remain free from injury will improve Outcome: Adequate for Discharge   Problem: Skin Integrity: Goal: Risk for impaired skin integrity will decrease Outcome: Adequate for Discharge   Problem: Education: Goal: Ability to state signs and symptoms to report to health care provider will improve Outcome: Adequate for Discharge Goal: Knowledge of the prescribed self-care regimen will improve Outcome: Adequate for  Discharge Goal: Knowledge of discharge needs will improve Outcome: Adequate for Discharge   Problem: Activity: Goal: Ability to tolerate increased activity will improve Outcome: Adequate for Discharge   Problem: Bowel/Gastric: Goal: Gastrointestinal status for postoperative course will improve Outcome: Adequate for Discharge Goal: Occurrences of nausea will decrease Outcome: Adequate for Discharge   Problem: Coping: Goal: Development of coping mechanisms to deal with changes in body function or appearance will improve Outcome: Adequate for Discharge   Problem: Fluid Volume: Goal: Maintenance of adequate hydration will improve Outcome: Adequate for Discharge   Problem: Nutritional: Goal: Nutritional status will improve Outcome: Adequate for Discharge   Problem: Clinical Measurements: Goal: Will show no signs or symptoms of venous thromboembolism Outcome: Adequate for Discharge Goal: Will remain free from infection Outcome: Adequate for Discharge Goal: Will show no signs of GI Leak Outcome: Adequate for Discharge   Problem: Respiratory: Goal: Will regain and/or maintain adequate ventilation Outcome: Adequate for Discharge   Problem: Pain Management: Goal: Pain level will decrease Outcome: Adequate for Discharge   Problem: Skin Integrity: Goal: Demonstration of wound healing without infection will improve Outcome: Adequate for Discharge   Problem: Education: Goal: Ability to describe self-care measures that may prevent or decrease complications (Diabetes Survival Skills Education) will improve Outcome: Adequate for Discharge Goal: Individualized Educational Video(s) Outcome: Adequate for Discharge   Problem: Coping: Goal: Ability to adjust to condition or change in health will improve Outcome: Adequate for Discharge   Problem: Fluid Volume: Goal: Ability to maintain a balanced intake and output will improve Outcome: Adequate for Discharge   Problem: Health  Behavior/Discharge Planning: Goal: Ability to identify and utilize available resources and services will improve Outcome: Adequate for Discharge Goal: Ability to manage health-related needs will improve Outcome: Adequate for Discharge   Problem: Metabolic: Goal: Ability to maintain appropriate glucose levels will improve Outcome: Adequate for Discharge   Problem: Nutritional: Goal: Maintenance of adequate nutrition will improve Outcome: Adequate for Discharge Goal: Progress toward  achieving an optimal weight will improve Outcome: Adequate for Discharge   Problem: Skin Integrity: Goal: Risk for impaired skin integrity will decrease Outcome: Adequate for Discharge   Problem: Tissue Perfusion: Goal: Adequacy of tissue perfusion will improve Outcome: Adequate for Discharge

## 2022-12-06 NOTE — Progress Notes (Signed)
Reviewed written d/c instructions w pt and his wife and all questions answered. Instructed/demonstrated to both pt and his wife about care of JP drain and 1 wk of supplies given to pt. They both verbalized understanding of above. D/C via w/c w all belongings in stable condition.

## 2022-12-09 ENCOUNTER — Other Ambulatory Visit (HOSPITAL_COMMUNITY): Payer: Self-pay

## 2022-12-19 ENCOUNTER — Other Ambulatory Visit: Payer: Self-pay

## 2022-12-20 ENCOUNTER — Other Ambulatory Visit (HOSPITAL_COMMUNITY): Payer: Self-pay

## 2022-12-22 ENCOUNTER — Other Ambulatory Visit (HOSPITAL_COMMUNITY): Payer: Self-pay

## 2022-12-23 ENCOUNTER — Other Ambulatory Visit (HOSPITAL_COMMUNITY): Payer: Self-pay

## 2022-12-23 MED ORDER — UNIFINE PENTIPS 32G X 4 MM MISC
3 refills | Status: DC
  Filled 2022-12-23: qty 100, 33d supply, fill #0
  Filled 2023-03-25: qty 100, 33d supply, fill #1

## 2022-12-24 ENCOUNTER — Other Ambulatory Visit (HOSPITAL_COMMUNITY): Payer: Self-pay

## 2023-01-01 ENCOUNTER — Other Ambulatory Visit: Payer: Self-pay

## 2023-01-01 ENCOUNTER — Other Ambulatory Visit (HOSPITAL_COMMUNITY): Payer: Self-pay

## 2023-01-01 MED ORDER — SUCRALFATE 1 G PO TABS
1.0000 g | ORAL_TABLET | Freq: Four times a day (QID) | ORAL | 11 refills | Status: DC
  Filled 2023-01-01: qty 120, 30d supply, fill #0

## 2023-01-08 ENCOUNTER — Other Ambulatory Visit (HOSPITAL_COMMUNITY): Payer: Self-pay | Admitting: General Surgery

## 2023-01-08 DIAGNOSIS — R1310 Dysphagia, unspecified: Secondary | ICD-10-CM

## 2023-01-15 ENCOUNTER — Ambulatory Visit (HOSPITAL_COMMUNITY)
Admission: RE | Admit: 2023-01-15 | Discharge: 2023-01-15 | Disposition: A | Discharge status: Home / Self Care | Payer: 59 | Source: Ambulatory Visit | Attending: General Surgery | Admitting: General Surgery

## 2023-01-15 DIAGNOSIS — R1310 Dysphagia, unspecified: Secondary | ICD-10-CM | POA: Insufficient documentation

## 2023-01-15 DIAGNOSIS — K224 Dyskinesia of esophagus: Secondary | ICD-10-CM | POA: Diagnosis not present

## 2023-01-15 DIAGNOSIS — K219 Gastro-esophageal reflux disease without esophagitis: Secondary | ICD-10-CM | POA: Diagnosis not present

## 2023-01-16 ENCOUNTER — Other Ambulatory Visit (HOSPITAL_COMMUNITY): Payer: Self-pay

## 2023-01-20 DIAGNOSIS — Z6833 Body mass index (BMI) 33.0-33.9, adult: Secondary | ICD-10-CM | POA: Diagnosis not present

## 2023-01-20 DIAGNOSIS — Z9884 Bariatric surgery status: Secondary | ICD-10-CM | POA: Diagnosis not present

## 2023-01-20 DIAGNOSIS — E6609 Other obesity due to excess calories: Secondary | ICD-10-CM | POA: Diagnosis not present

## 2023-01-20 DIAGNOSIS — R1310 Dysphagia, unspecified: Secondary | ICD-10-CM | POA: Diagnosis not present

## 2023-01-20 DIAGNOSIS — K912 Postsurgical malabsorption, not elsewhere classified: Secondary | ICD-10-CM | POA: Diagnosis not present

## 2023-01-26 ENCOUNTER — Telehealth: Payer: Self-pay

## 2023-01-26 NOTE — Telephone Encounter (Signed)
-----   Message from Lemar Lofty., MD sent at 01/26/2023  5:45 AM EDT ----- Regarding: RE: same problem, new location LAK, You do that.  Terrell Ostrand, This patient upper endoscopy in the hospital scheduled for potential dilation or minus stenting with AXIOS for dysphagia. Please look for an August date for him. If he is doing well at that time and not requiring endoscopy we can certainly cancel. Thanks. GM ----- Message ----- From: Rodman Pickle, MD Sent: 01/22/2023  11:18 AM EDT To: Lemar Lofty., MD Subject: RE: same problem, new location                 Yes I would go ahead and do that for July or August  ----- Message ----- From: Lemar Lofty., MD Sent: 01/20/2023   5:24 PM EDT To: De Blanch Kinsinger, MD Subject: RE: same problem, new location                 LAK, I am so sorry to hear about this. This is certainly frustrating. I may just go ahead and book him for July or August for a procedure as I am almost booked out until then. Let me know if you think that is okay or you want a wait. Thanks. GM ----- Message ----- From: Rodman Pickle, MD Sent: 01/20/2023   2:32 PM EDT To: Lemar Lofty., MD Subject: same problem, new location                     [name],  Hector Curry's surgery is done. He is tolerating secretions and some liquids but the E-G is quite narrow. I got an UGI and the barium tablet did not pass.   He is 6 weeks out from surgery now. I'm seeing him in the office on 5/30. He has a g tube in place. I don't think he has another surgical option as this surgery was quite difficult. I don't think we need to be overly aggressive but he may need endoscopy with dilation sometime this summer.  [name]

## 2023-01-29 ENCOUNTER — Other Ambulatory Visit: Payer: Self-pay

## 2023-01-29 DIAGNOSIS — R1310 Dysphagia, unspecified: Secondary | ICD-10-CM

## 2023-01-29 DIAGNOSIS — K9189 Other postprocedural complications and disorders of digestive system: Secondary | ICD-10-CM

## 2023-01-29 NOTE — Telephone Encounter (Signed)
EGD scheduled, pt instructed and medications reviewed.  Patient instructions mailed to home.  Patient to call with any questions or concerns.  He will hold plavix 5 days prior to the procedure.  He will call if he has any questions or concerns

## 2023-01-29 NOTE — Telephone Encounter (Signed)
EGD dil scheduled for 04/09/23 at 330 pm at [facility]   Left message on machine to call back

## 2023-02-02 ENCOUNTER — Other Ambulatory Visit (HOSPITAL_COMMUNITY): Payer: Self-pay

## 2023-02-11 ENCOUNTER — Other Ambulatory Visit (HOSPITAL_COMMUNITY): Payer: Self-pay

## 2023-02-24 ENCOUNTER — Other Ambulatory Visit: Payer: Self-pay

## 2023-02-24 ENCOUNTER — Encounter (HOSPITAL_COMMUNITY): Payer: Self-pay | Admitting: Hematology

## 2023-02-24 ENCOUNTER — Other Ambulatory Visit (HOSPITAL_COMMUNITY): Payer: Self-pay

## 2023-02-27 ENCOUNTER — Other Ambulatory Visit (HOSPITAL_COMMUNITY): Payer: Self-pay

## 2023-03-13 ENCOUNTER — Telehealth: Payer: Self-pay | Admitting: Gastroenterology

## 2023-03-13 NOTE — Telephone Encounter (Signed)
Inbound call from patient's requesting to have a call back to her if there is any sooner availability were to come up for patient procedure at [facility] for 8/15. Please advise, thank you.

## 2023-03-13 NOTE — Telephone Encounter (Signed)
The pt wife advised that we have no sooner appts at this time.  I will call if we have any cancellations.  The pt has been advised of the information and verbalized understanding.

## 2023-03-20 ENCOUNTER — Encounter (HOSPITAL_COMMUNITY): Payer: Self-pay | Admitting: Hematology

## 2023-03-24 ENCOUNTER — Encounter (HOSPITAL_COMMUNITY): Payer: Self-pay | Admitting: Hematology

## 2023-03-25 ENCOUNTER — Other Ambulatory Visit (HOSPITAL_COMMUNITY): Payer: Self-pay

## 2023-03-25 ENCOUNTER — Encounter (HOSPITAL_COMMUNITY): Payer: Self-pay | Admitting: Hematology

## 2023-03-27 ENCOUNTER — Ambulatory Visit: Payer: 59 | Admitting: Internal Medicine

## 2023-03-27 ENCOUNTER — Other Ambulatory Visit (HOSPITAL_COMMUNITY): Payer: Self-pay

## 2023-04-02 ENCOUNTER — Telehealth: Payer: Self-pay

## 2023-04-02 ENCOUNTER — Encounter (HOSPITAL_COMMUNITY): Payer: Self-pay | Admitting: Gastroenterology

## 2023-04-02 NOTE — Telephone Encounter (Signed)
Hector Curry, this pt is for next Thurs 8/15 he had a stent removal on 4/5 which you did an anesthesia note on 3/27. Then on 4/9 had partial gastrectomy and esophagectomy with E-G anastomosis, g tube, small bowel resection. I havent spoken to the patient yet but before I called him was wondering if you think he needs a new cardiac clearance or anything?   10:48 AM CB Last cards office visit was 09/26/22 with f/u 6 months and they did cards clearance for anticoag hold on 11/10/22 so I think they are using that same clearance for this as well  59 mins KG [name], PA-C I think they need to see if he needs this CTA chest first  40 mins CB Hector Shipper, Hector Curry Okay I will tag in the office  38 mins You were added by Hector Shipper, Hector Curry. 38 mins KG [name], PA-C Looks like it was ordered by Hector. [name] in February but is not scheduled  37 mins CB Hector Shipper, Hector Curry Hey  this pt is for next Thurs and is needing CTA chest before procedure  35 mins Hector Lofty., Hector Curry was added by you. 21 mins Hector Curry please advise   21 mins Hector Lofty., Hector Curry I don't know that he absolutely needs that completed persea if he had a major surgery redo in April without it having been completed.   GM I certainly will defer to our anesthesia colleagues to decide, but again, he had major redo GI surgery in April and did not have the CT-A completed before then.   19 mins KG [name], PA-C I'm not sure that he does either, it would be nice if Cardiology could comment on it prior to another anesthesia event. I can run it by the anesthesiologist for the day and see what they think  16 mins GM Hector Lofty., Hector Curry Agree, just let us know. Thanks for helping.  15 mins KG [name], PA-C Discussed with Hector. [name] - she wants the CTA done prior  7 mins CB GM Hector Lofty., Hector Curry OK. Please let patient know Anesthesia concerns and  need for completion of radiology imaging before his EGD, please reschedule if we cannot get CTA done prior. Thanks.

## 2023-04-02 NOTE — Telephone Encounter (Signed)
Hector Curry see the messages regarding this pt and the recommendations.  He needs to have completed before 8/15.  Thank you      , the order already is in place. We just need radiology to schedule it. It was placed back in 2/24, so I would just have that order work. Otherwise, we need the ordering provider team to get this done, so that Anesthesia can have what they need to allow [place] to do procedure. Thanks.  1  GM CT Angio chest/aorta order is in place under imaging

## 2023-04-03 ENCOUNTER — Telehealth: Payer: Self-pay | Admitting: Internal Medicine

## 2023-04-03 NOTE — Telephone Encounter (Signed)
Checking percert on the following patient for testing scheduled at [facility].    CT ANGIO CHEST    04-08-2023

## 2023-04-03 NOTE — Telephone Encounter (Signed)
Hector Curry, If this is the case and we do not have the ability to have cardiology optimization prior to the CT angiography, then he will need to be rescheduled. Lets see what cardiology (Dr. Jenene Slicker) has to say by Tuesday of next week before we cancel next week's procedure. If we need to reschedule then reschedule this patient at the end of August or in September. Thanks. GM

## 2023-04-03 NOTE — Telephone Encounter (Signed)
Dr Meridee Score see note

## 2023-04-03 NOTE — Telephone Encounter (Signed)
I called the patient today to discuss the issues surrounding our ability to perform endoscopy next week as result of anesthesia feeling that he needed either cardiology clearance or the CT angiography that had been ordered back in February to be completed before he could have the scheduled endoscopy and potential stenting of his anastomosis next week. The patient is continuing to experience symptoms of dysphagia and really is only tolerating liquids even after his redo bypass surgery. After speaking with the patient, he told me that he has confirmed that his cardiologist Dr. Jenene Slicker, is still in network. As such, he should be able to have the CT that had been ordered back in February (even before his large redo surgery that was performed) completed, and be in network.  At this point I do not want to remove him from the list for next week, because he can be open and available for the imaging study to be completed if necessary or if he needs to be seen by cardiology to give optimization.  I am putting the cardiology team on here as well as in the preanesthesia team as well as the preprocedure hospital team on here so that hopefully we can figure out if the patient can have his procedure done next week and hopefully have the CT scans completed so that everything can move forward.  I will not release the endoscopy date until next week.    Cardiology team, can we work on getting the CTA chest completed, he is in network?  Or, is the patient optimized for potential endoscopy with sedation (propofol versus general)?  Anesthesia team, if patient does not have CTA chest completed, but is felt to be optimized from a cardiology standpoint can he still have his procedure next week?  [name], for now keep the current scheduled date and we can reconvene by next week to see what we can do for the patient who continues to have symptoms of dysphagia even after his redo bypass.   Corliss Parish, MD [facility] Office # [phone]

## 2023-04-03 NOTE — Telephone Encounter (Signed)
Spoke to pt who stated that he is no longer our cardiology pt as his insurance has changed his network and declined to have CTA Chest completed at this time because it will be out of network and he will have to pay a huge chunk of money out of pocket and he is not willing to do so at this time

## 2023-04-03 NOTE — Telephone Encounter (Signed)
Patient states that his insurance is no longer in network. He will not have the CT Angio done.

## 2023-04-03 NOTE — Telephone Encounter (Signed)
VPM, Thanks for this input. I believe that he has done well, and really seems he had the bigger test during his surgery in April. Will allow our Anesthesia team to discuss this early next week to help [place] determine being able to move forward with the procedure or not on Thursday. Thanks to all. GM

## 2023-04-04 NOTE — Telephone Encounter (Signed)
Thanks for this follow-up. I agree I think reasonable to move forward with endoscopy, will see our anesthesia colleagues follow-up on this. GM

## 2023-04-06 ENCOUNTER — Encounter: Payer: Self-pay | Admitting: Gastroenterology

## 2023-04-06 ENCOUNTER — Encounter: Payer: Self-pay | Admitting: Internal Medicine

## 2023-04-06 NOTE — Telephone Encounter (Signed)
Noted    GM

## 2023-04-06 NOTE — Progress Notes (Signed)
Choose an anesthesia record to view details        DISCUSSION: Hector Curry is a 62 yo male who is being evaluated prior to EGD. PMH significant for hx of obesity s/p lap band c/b esophageal stricture, NSTEMI, CAD s/p PCI, PSVT, atrial tachycardia, dCHF, former smoker (quit 30 years ago), OSA on BiPAP, COPD, insulin dependent DM.  No prior anesthesia complications. He most recently had a redo of his gastric bypass on 12/02/22 but still has ongoing dysphagia so is scheduled for an EGD with Dr. Meridee Score on 04/09/23.  Patient follows with Cardiology for hx of NSTEMI and CAD. Also has TAA measuring 44mm on Echo on 04/15/2021. A CTA chest was ordered for surveillance at his last office visit in Feb 2024 by Dr. Jenene Slicker which has not yet been completed. Cardiology recommendations prior to EGD by Dr. Jenene Slicker.  "per my evaluation in 09/2022, he is at an intermediate risk for any perioperative complications for the GI procedure. He did not report any angina or DOE at that time.  If he did not have any angina in the interval period, similar preop recommendations would apply. CTA chest/aorta is mainly for surveillance of aorta dilatation. He can safely undergo GI procedure without having to undergo CTA chest/aorta.   He underwent partial gastrectomy and esophagectomy with E-G anastomosis, g tube, small bowel resection in 04/24 and tolerated the procedure very well. EGD is a low risk procedure and I would not delay the procedure for CTA"  VS: There were no vitals taken for this visit.  PROVIDERS: Marguarite Arbour, MD   LABS: n/a (all labs ordered are listed, but only abnormal results are displayed)  Labs Reviewed - No data to display   IMAGES:   EKG:   CV:  Past Medical History:  Diagnosis Date   Allergic rhinitis    Atrial tachycardia    CAD (coronary artery disease)    a. 05/08/2015 Cath: no significant CAD, LVEF nl-->Med; b. 07/2017 MV: attenuation artifact, no ischemia, EF  65%-->Low risk; c. 03/2021 NSTEMI/PCI: LM nl, LAD 80p (3.0x18 Onyx Frontier DES), D1 90 (too small for PCI), LCX nl, RCA nl.   Diabetes mellitus without complication (HCC)    Diastolic dysfunction    a. 04/2015 Echo: EF 60-65%; b. 05/2016 Echo: EF 60-65%, GrI DD; c. 07/2017 Echo: EF 55-60%, Ao root 42mm; d. 03/2019 Echo: EF 55-60%, Ao root/Asc Ao 42mm; e. 03/2021 Echo: EF 50-55%, apical HK, mod asymm basal-septal hypertrophy. Nl RV fxn. Asc Ao 44mm.   Dilated aortic root (HCC)    a. 07/2017 Echo: 42mm Ao root - mildly dil; b. 03/2019 Echo: Ao root 42mm; c. 03/2021 Echo: Asc Ao 44mm.   Diverticulosis 10/03/2014   Dysrhythmia    Esophageal stricture    a.  In setting of lap band June 2022-stricture at the GJ anastomosis requiring gastrostomy tube; b. 05/2021 s/p esoph stenting (15mm); c. 07/2021 s/p esoph stenting (20mm).   Family history of premature CAD    a. father passed from MI at 34   GERD (gastroesophageal reflux disease)    GIB (gastrointestinal bleeding)    a. 07/2021 following esoph stenting.   Hemorrhoids    a. internal hemorrhoids s/p surgery 1999   History of kidney stones    History of tobacco abuse    Hyperlipidemia    Hyperplastic colon polyp 10/03/2014   a. x 2    Hypertension    Inflammatory arthritis    a. CCP antibodies & x-rays negative. Rheumatoid  factor 14, felt to be crystaline over RA or psoriatic   Iron deficiency anemia 02/05/2022   Morbid obesity (HCC)    a. s/p LAP-BAND - complicated by esoph stricture.   Myocardial infarction [facility])    NSTEMI   OSA (obstructive sleep apnea)    a. on CPAP   Osteoarthritis    PSVT (paroxysmal supraventricular tachycardia)    a. 48 hr Holter 04/2015: NSR w/ rare PVC, short runs of narrow complex tachycardiac, possible atrial tach, longest run 7 beats, PACs noted (2% of all beats 3600 total) they did not seem to correlate w/ significant arrythmia; b. 08/2017 Event monitor: no significant arrhythmias.    Past Surgical History:   Procedure Laterality Date   BALLOON DILATION N/A 08/14/2021   Procedure: BALLOON DILATION;  Surgeon: Mansouraty, Netty Starring., MD;  Location: [facility] ENDOSCOPY;  Service: Gastroenterology;  Laterality: N/A;   BALLOON DILATION N/A 02/20/2022   Procedure: BALLOON DILATION;  Surgeon: Rachael Fee, MD;  Location: [facility] ENDOSCOPY;  Service: Gastroenterology;  Laterality: N/A;   BALLOON DILATION N/A 03/27/2022   Procedure: BALLOON DILATION;  Surgeon: Meridee Score Netty Starring., MD;  Location: [facility] ENDOSCOPY;  Service: Gastroenterology;  Laterality: N/A;   BARIATRIC SURGERY  01/2021   lap band    BILIARY STENT PLACEMENT N/A 08/14/2021   Procedure: AXIOS STENT PLACEMENT;  Surgeon: Lemar Lofty., MD;  Location: [facility] ENDOSCOPY;  Service: Gastroenterology;  Laterality: N/A;   BILIARY STENT PLACEMENT N/A 10/16/2022   Procedure: STENT PLACEMENT;  Surgeon: Lemar Lofty., MD;  Location: [facility] ENDOSCOPY;  Service: Gastroenterology;  Laterality: N/A;   BIOPSY  07/08/2021   Procedure: BIOPSY;  Surgeon: Meridee Score Netty Starring., MD;  Location: [facility] ENDOSCOPY;  Service: Gastroenterology;;   BIOPSY  08/14/2021   Procedure: BIOPSY;  Surgeon: Lemar Lofty., MD;  Location: [facility] ENDOSCOPY;  Service: Gastroenterology;;   BIOPSY  10/16/2022   Procedure: BIOPSY;  Surgeon: Lemar Lofty., MD;  Location: [facility] ENDOSCOPY;  Service: Gastroenterology;;   CARDIAC CATHETERIZATION N/A 05/08/2015   Procedure: Left Heart Cath and Coronary Angiography;  Surgeon: Corky Crafts, MD;  Location: [facility] INVASIVE CV LAB;  Service: Cardiovascular;  Laterality: N/A;   CARPAL TUNNEL RELEASE Bilateral    CHOLECYSTECTOMY     COLONOSCOPY  06/27/2005   COLONOSCOPY  10/03/2014   CORONARY STENT INTERVENTION N/A 04/15/2021   Procedure: CORONARY STENT INTERVENTION;  Surgeon: Runell Gess, MD;  Location: [facility] INVASIVE CV LAB;  Service: Cardiovascular;  Laterality: N/A;   DUODENAL STENT PLACEMENT N/A 11/18/2021    Procedure: DUODENAL STENT PLACEMENT;  Surgeon: Meridee Score Netty Starring., MD;  Location: [facility] ENDOSCOPY;  Service: Gastroenterology;  Laterality: N/A;  axios placed at GJ anastamosis   DUODENAL STENT PLACEMENT  03/27/2022   Procedure: GASTRIC STENT PLACEMENT;  Surgeon: Meridee Score Netty Starring., MD;  Location: [facility] ENDOSCOPY;  Service: Gastroenterology;;   ESOPHAGEAL DILATION  05/27/2021   Procedure: ESOPHAGEAL DILATION;  Surgeon: Lemar Lofty., MD;  Location: [facility];  Service: Gastroenterology;;   ESOPHAGEAL DILATION  01/23/2022   Procedure: ESOPHAGEAL DILATION;  Surgeon: Lemar Lofty., MD;  Location: [facility] ENDOSCOPY;  Service: Gastroenterology;;   ESOPHAGEAL STENT PLACEMENT N/A 05/27/2021   Procedure: ESOPHAGEAL STENT PLACEMENT;  Surgeon: Lemar Lofty., MD;  Location: [facility] ENDOSCOPY;  Service: Gastroenterology;  Laterality: N/A;   ESOPHAGOGASTRODUODENOSCOPY  06/27/2005   ESOPHAGOGASTRODUODENOSCOPY N/A 03/19/2021   Procedure: ESOPHAGOGASTRODUODENOSCOPY (EGD);  Surgeon: Sheliah Hatch, De Blanch, MD;  Location: [facility] ENDOSCOPY;  Service: General;  Laterality: N/A;   ESOPHAGOGASTRODUODENOSCOPY N/A 08/16/2021  Procedure: ESOPHAGOGASTRODUODENOSCOPY (EGD);  Surgeon: Lemar Lofty., MD;  Location: [facility] ENDOSCOPY;  Service: Gastroenterology;  Laterality: N/A;   ESOPHAGOGASTRODUODENOSCOPY (EGD) WITH PROPOFOL N/A 05/27/2021   Procedure: ESOPHAGOGASTRODUODENOSCOPY (EGD) WITH PROPOFOL;  Surgeon: Meridee Score Netty Starring., MD;  Location: [facility] ENDOSCOPY;  Service: Gastroenterology;  Laterality: N/A;   ESOPHAGOGASTRODUODENOSCOPY (EGD) WITH PROPOFOL N/A 07/08/2021   Procedure: ESOPHAGOGASTRODUODENOSCOPY (EGD) WITH PROPOFOL;  Surgeon: Meridee Score Netty Starring., MD;  Location: [facility] ENDOSCOPY;  Service: Gastroenterology;  Laterality: N/A;  fluoro   ESOPHAGOGASTRODUODENOSCOPY (EGD) WITH PROPOFOL N/A 08/14/2021   Procedure: ESOPHAGOGASTRODUODENOSCOPY (EGD) WITH PROPOFOL;  Surgeon: Meridee Score Netty Starring., MD;  Location: [facility] ENDOSCOPY;  Service: Gastroenterology;  Laterality: N/A;  fluoro Axios stent (20 mm)   ESOPHAGOGASTRODUODENOSCOPY (EGD) WITH PROPOFOL N/A 10/16/2021   Procedure: ESOPHAGOGASTRODUODENOSCOPY (EGD) WITH PROPOFOL;  Surgeon: Meridee Score Netty Starring., MD;  Location: [facility] ENDOS[facility];  Service: Gastroenterology;  Laterality: N/A;  axios stent pull fluoro   ESOPHAGOGASTRODUODENOSCOPY (EGD) WITH PROPOFOL N/A 11/18/2021   Procedure: ESOPHAGOGASTRODUODENOSCOPY (EGD) WITH PROPOFOL;  Surgeon: Meridee Score Netty Starring., MD;  Location: [facility];  Service: Gastroenterology;  Laterality: N/A;   ESOPHAGOGASTRODUODENOSCOPY (EGD) WITH PROPOFOL N/A 01/23/2022   Procedure: ESOPHAGOGASTRODUODENOSCOPY (EGD) WITH PROPOFOL;  Surgeon: Meridee Score Netty Starring., MD;  Location: [facility] ENDOSCOPY;  Service: Gastroenterology;  Laterality: N/A;   ESOPHAGOGASTRODUODENOSCOPY (EGD) WITH PROPOFOL N/A 02/20/2022   Procedure: ESOPHAGOGASTRODUODENOSCOPY (EGD) WITH PROPOFOL;  Surgeon: Rachael Fee, MD;  Location: [facility] ENDOSCOPY;  Service: Gastroenterology;  Laterality: N/A;   ESOPHAGOGASTRODUODENOSCOPY (EGD) WITH PROPOFOL N/A 03/27/2022   Procedure: ESOPHAGOGASTRODUODENOSCOPY (EGD) WITH PROPOFOL;  Surgeon: Meridee Score Netty Starring., MD;  Location: [facility] ENDOSCOPY;  Service: Gastroenterology;  Laterality: N/A;   ESOPHAGOGASTRODUODENOSCOPY (EGD) WITH PROPOFOL N/A 08/14/2022   Procedure: ESOPHAGOGASTRODUODENOSCOPY (EGD) WITH PROPOFOL;  Surgeon: Meridee Score Netty Starring., MD;  Location: [facility] ENDOS[facility];  Service: Gastroenterology;  Laterality: N/A;   ESOPHAGOGASTRODUODENOSCOPY (EGD) WITH PROPOFOL N/A 10/16/2022   Procedure: ESOPHAGOGASTRODUODENOSCOPY (EGD) WITH PROPOFOL;  Surgeon: Meridee Score Netty Starring., MD;  Location: [facility] ENDOS[facility];  Service: Gastroenterology;  Laterality: N/A;   ESOPHAGOGASTRODUODENOSCOPY (EGD) WITH PROPOFOL N/A 11/28/2022   Procedure: ESOPHAGOGASTRODUODENOSCOPY (EGD) WITH PROPOFOL WITH AXIOS STENT REMOVAL;  Surgeon:  Meridee Score Netty Starring., MD;  Location: [facility] ENDOSCOPY;  Service: Gastroenterology;  Laterality: N/A;   FLEXIBLE SIGMOIDOSCOPY N/A 08/16/2021   Procedure: FLEXIBLE SIGMOIDOSCOPY;  Surgeon: Meridee Score Netty Starring., MD;  Location: [facility] ENDOSCOPY;  Service: Gastroenterology;  Laterality: N/A;   FOREIGN BODY REMOVAL  02/20/2022   Procedure: FOREIGN BODY REMOVAL;  Surgeon: Rachael Fee, MD;  Location: [facility] ENDOSCOPY;  Service: Gastroenterology;;   FOREIGN BODY REMOVAL ESOPHAGEAL  03/27/2022   Procedure: REMOVAL FOREIGN BODY;  Surgeon: Meridee Score Netty Starring., MD;  Location: [facility] ENDOSCOPY;  Service: Gastroenterology;;   GASTRIC ROUX-EN-Y N/A 02/18/2021   Procedure: LAPAROSCOPIC ROUX-EN-Y GASTRIC BYPASS WITH UPPER ENDOSCOPY,;  Surgeon: Sheliah Hatch De Blanch, MD;  Location: [facility] ORS;  Service: General;  Laterality: N/A;   GASTRIC ROUX-EN-Y N/A 12/02/2022   Procedure: LAPAROSCOPIC converted to OPEN GASTRIC BYPASS REVERSAL, TAKEDOWN OF GASTROJEJUNAL ANASTOMOSIS, GASTRO GASTRIC ANASTOMOSIS WITH UPPER ENDOSCOPY 1. Laparoscopic lysis of adhesions 2. Partial gastrectomy 3. Small bowel resection 4. Creation of esophagus to gastric anastomosis 5. Creation of gastrostomy;  Surgeon: Kinsinger, De Blanch, MD;  Location: [facility] ORS;  Service: General;  Laterality:    GASTROINTESTINAL STENT REMOVAL  01/23/2022   Procedure: Christy Sartorius REMOVAL;  Surgeon: Lemar Lofty., MD;  Location: [facility] ENDOSCOPY;  Service: Gastroenterology;;   GASTROSTOMY N/A 12/02/2022   Procedure: INSERTION OF GASTROSTOMY TUBE;  Surgeon: Rodman Pickle, MD;  Location: [facility] ORS;  Service: General;  Laterality: N/A;   HEMORRHOID SURGERY  08/25/1997   IR GJ TUBE CHANGE  03/27/2021   LAPAROSCOPIC INSERTION GASTROSTOMY TUBE N/A 03/20/2021   Procedure: LAPAROSCOPIC INSERTION GASTROSTOMY TUBE;  Surgeon: Sheliah Hatch De Blanch, MD;  Location: [facility] OR[facility];  Service: General;  Laterality: N/A;   LEFT HEART CATH AND CORONARY ANGIOGRAPHY N/A 04/15/2021    Procedure: LEFT HEART CATH AND CORONARY ANGIOGRAPHY;  Surgeon: Runell Gess, MD;  Location: [facility] INVASIVE CV LAB;  Service: Cardiovascular;  Laterality: N/A;   MOUTH SURGERY     removed area which was benign   STENT REMOVAL  07/08/2021   Procedure: STENT REMOVAL;  Surgeon: Lemar Lofty., MD;  Location: [facility] ENDOSCOPY;  Service: Gastroenterology;;   Francine Graven REMOVAL  10/16/2021   Procedure: STENT REMOVAL;  Surgeon: Lemar Lofty., MD;  Location: [facility] ENDOSCOPY;  Service: Gastroenterology;;   Francine Graven REMOVAL  08/14/2022   Procedure: STENT REMOVAL;  Surgeon: Lemar Lofty., MD;  Location: [facility] ENDOSCOPY;  Service: Gastroenterology;;   Francine Graven REMOVAL  11/28/2022   Procedure: STENT REMOVAL;  Surgeon: Lemar Lofty., MD;  Location: [facility] ENDOSCOPY;  Service: Gastroenterology;;   STERIOD INJECTION  01/23/2022   Procedure: STEROID INJECTION;  Surgeon: Lemar Lofty., MD;  Location: [facility] ENDOSCOPY;  Service: Gastroenterology;;   SUBMUCOSAL INJECTION  11/18/2021   Procedure: SUBMUCOSAL INJECTION;  Surgeon: Lemar Lofty., MD;  Location: [facility] ENDOSCOPY;  Service: Gastroenterology;;   SUBMUCOSAL INJECTION  08/14/2022   Procedure: SUBMUCOSAL INJECTION;  Surgeon: Lemar Lofty., MD;  Location: [facility] ENDOSCOPY;  Service: Gastroenterology;;   TONSILLECTOMY     UPPER GI ENDOSCOPY N/A 03/20/2021   Procedure: UPPER GI ENDOSCOPY;  Surgeon: Kinsinger, De Blanch, MD;  Location: [facility] ORS;  Service: General;  Laterality: N/A;    MEDICATIONS: No current facility-administered medications for this encounter.    aspirin EC 81 MG tablet   atorvastatin (LIPITOR) 10 MG tablet   clopidogrel (PLAVIX) 75 MG tablet   Continuous Glucose Sensor (FREESTYLE LIBRE 14 DAY SENSOR) MISC   fluticasone (FLONASE) 50 MCG/ACT nasal spray   insulin lispro (HUMALOG) 100 UNIT/ML KwikPen   Insulin Pen Needle (UNIFINE PENTIPS) 32G X 4 MM MISC   loratadine (CLARITIN) 10 MG tablet   METOPROLOL  SUCCINATE PO   mometasone (ELOCON) 0.1 % lotion   Multiple Vitamin (MULTI-VITAMIN DAILY PO)   nitroGLYCERIN (NITROSTAT) 0.4 MG SL tablet   pantoprazole (PROTONIX) 40 MG tablet   Potassium Chloride ER 20 MEQ TBCR   PRESCRIPTION MEDICATION   propranolol (INDERAL) 20 MG tablet   Propylene Glycol (SYSTANE BALANCE OP)   Pseudoeph-Doxylamine-DM-APAP (NYQUIL PO)   sucralfate (CARAFATE) 1 g tablet   VITAMIN E PO   Ubaldo Glassing, PA-C [facility] Surgical Short Stay/Anesthesiology [facility] Phone [phone] 04/06/2023 9:19 AM

## 2023-04-06 NOTE — Telephone Encounter (Signed)
Thanks for everyone's help. GM

## 2023-04-06 NOTE — Anesthesia Preprocedure Evaluation (Signed)
Anesthesia Evaluation  Patient identified by MRN, date of birth, ID band Patient awake    Reviewed: Allergy & Precautions, NPO status , Patient's Chart, lab work & pertinent test results, reviewed documented beta blocker date and time   History of Anesthesia Complications Negative for: history of anesthetic complications  Airway Mallampati: III  TM Distance: >3 FB Neck ROM: Full    Dental  (+) Dental Advisory Given, Teeth Intact   Pulmonary sleep apnea and Continuous Positive Airway Pressure Ventilation , former smoker   breath sounds clear to auscultation       Cardiovascular hypertension, Pt. on medications and Pt. on home beta blockers (-) angina + CAD, + Past MI and + Cardiac Stents  + dysrhythmias Supra Ventricular Tachycardia  Rhythm:Regular Rate:Normal  '22 CATH: LM nl, LAD 80p (3.0x18 Onyx Frontier DES), D1 90 (too small for PCI), LCX nl, RCA nl. '22 ECHO: EF 50-55%, apical HK, mod asymm basal-septal hypertrophy. Nl RV fxn.  Hx/o NSTEMI post op lap gastric banding   Neuro/Psych negative neurological ROS  negative psych ROS   GI/Hepatic Neg liver ROS,GERD  Medicated and Controlled,,S/p lap band Dysphagia- can only swallow liquids Hx/o esophageal stricture with multiple dilations in the past   Endo/Other  diabetes, Well Controlled, Type 2, Insulin Dependent    Renal/GU Renal diseaseHx/o renal calculi     Musculoskeletal  (+) Arthritis ,    Abdominal   Peds  Hematology  (+) Blood dyscrasia (Plavix), anemia Plavix therapy- last dose more than a month ago   Anesthesia Other Findings dysphagia- stricture  Reproductive/Obstetrics                              Anesthesia Physical Anesthesia Plan  ASA: 3  Anesthesia Plan: MAC   Post-op Pain Management: Minimal or no pain anticipated   Induction: Intravenous  PONV Risk Score and Plan: 3 and Treatment may vary due to age or medical  condition and Propofol infusion  Airway Management Planned: Natural Airway and Simple Face Mask  Additional Equipment: None  Intra-op Plan:   Post-operative Plan:   Informed Consent: I have reviewed the patients History and Physical, chart, labs and discussed the procedure including the risks, benefits and alternatives for the proposed anesthesia with the patient or authorized representative who has indicated his/her understanding and acceptance.     Dental advisory given  Plan Discussed with: CRNA and Anesthesiologist  Anesthesia Plan Comments: (See PAT note from 8/12 by K Gekas PA-C )        Anesthesia Quick Evaluation

## 2023-04-07 ENCOUNTER — Ambulatory Visit: Payer: 59 | Admitting: Internal Medicine

## 2023-04-08 ENCOUNTER — Other Ambulatory Visit (HOSPITAL_COMMUNITY): Payer: Managed Care, Other (non HMO)

## 2023-04-09 ENCOUNTER — Encounter (HOSPITAL_COMMUNITY)
Admission: RE | Disposition: A | Discharge status: Home / Self Care | Payer: Self-pay | Source: Home / Self Care | Attending: Gastroenterology

## 2023-04-09 ENCOUNTER — Other Ambulatory Visit: Payer: Self-pay

## 2023-04-09 ENCOUNTER — Ambulatory Visit (HOSPITAL_BASED_OUTPATIENT_CLINIC_OR_DEPARTMENT_OTHER): Payer: Managed Care, Other (non HMO) | Admitting: Medical

## 2023-04-09 ENCOUNTER — Other Ambulatory Visit (HOSPITAL_COMMUNITY): Payer: Self-pay

## 2023-04-09 ENCOUNTER — Encounter (HOSPITAL_COMMUNITY): Payer: Self-pay | Admitting: Hematology

## 2023-04-09 ENCOUNTER — Telehealth: Payer: Self-pay

## 2023-04-09 ENCOUNTER — Ambulatory Visit (HOSPITAL_COMMUNITY): Payer: Managed Care, Other (non HMO) | Admitting: Medical

## 2023-04-09 ENCOUNTER — Encounter (HOSPITAL_COMMUNITY): Payer: Self-pay | Admitting: Gastroenterology

## 2023-04-09 ENCOUNTER — Ambulatory Visit (HOSPITAL_COMMUNITY): Payer: Managed Care, Other (non HMO)

## 2023-04-09 ENCOUNTER — Ambulatory Visit (HOSPITAL_COMMUNITY)
Admission: RE | Admit: 2023-04-09 | Discharge: 2023-04-09 | Disposition: A | Discharge status: Home / Self Care | Payer: Managed Care, Other (non HMO) | Attending: Gastroenterology | Admitting: Gastroenterology

## 2023-04-09 DIAGNOSIS — K222 Esophageal obstruction: Secondary | ICD-10-CM

## 2023-04-09 DIAGNOSIS — Z9889 Other specified postprocedural states: Secondary | ICD-10-CM | POA: Insufficient documentation

## 2023-04-09 DIAGNOSIS — J449 Chronic obstructive pulmonary disease, unspecified: Secondary | ICD-10-CM | POA: Insufficient documentation

## 2023-04-09 DIAGNOSIS — I4719 Other supraventricular tachycardia: Secondary | ICD-10-CM | POA: Insufficient documentation

## 2023-04-09 DIAGNOSIS — K209 Esophagitis, unspecified without bleeding: Secondary | ICD-10-CM | POA: Diagnosis not present

## 2023-04-09 DIAGNOSIS — I2510 Atherosclerotic heart disease of native coronary artery without angina pectoris: Secondary | ICD-10-CM | POA: Diagnosis not present

## 2023-04-09 DIAGNOSIS — G4733 Obstructive sleep apnea (adult) (pediatric): Secondary | ICD-10-CM | POA: Insufficient documentation

## 2023-04-09 DIAGNOSIS — I471 Supraventricular tachycardia, unspecified: Secondary | ICD-10-CM | POA: Insufficient documentation

## 2023-04-09 DIAGNOSIS — E119 Type 2 diabetes mellitus without complications: Secondary | ICD-10-CM | POA: Insufficient documentation

## 2023-04-09 DIAGNOSIS — K2100 Gastro-esophageal reflux disease with esophagitis, without bleeding: Secondary | ICD-10-CM | POA: Insufficient documentation

## 2023-04-09 DIAGNOSIS — Z79899 Other long term (current) drug therapy: Secondary | ICD-10-CM | POA: Diagnosis not present

## 2023-04-09 DIAGNOSIS — I252 Old myocardial infarction: Secondary | ICD-10-CM | POA: Insufficient documentation

## 2023-04-09 DIAGNOSIS — K9589 Other complications of other bariatric procedure: Secondary | ICD-10-CM | POA: Diagnosis not present

## 2023-04-09 DIAGNOSIS — Z9861 Coronary angioplasty status: Secondary | ICD-10-CM | POA: Insufficient documentation

## 2023-04-09 DIAGNOSIS — Z87891 Personal history of nicotine dependence: Secondary | ICD-10-CM | POA: Diagnosis not present

## 2023-04-09 DIAGNOSIS — Z794 Long term (current) use of insulin: Secondary | ICD-10-CM | POA: Insufficient documentation

## 2023-04-09 DIAGNOSIS — I10 Essential (primary) hypertension: Secondary | ICD-10-CM | POA: Diagnosis not present

## 2023-04-09 DIAGNOSIS — Z9884 Bariatric surgery status: Secondary | ICD-10-CM | POA: Diagnosis not present

## 2023-04-09 DIAGNOSIS — K9189 Other postprocedural complications and disorders of digestive system: Secondary | ICD-10-CM

## 2023-04-09 DIAGNOSIS — R1310 Dysphagia, unspecified: Secondary | ICD-10-CM

## 2023-04-09 DIAGNOSIS — Z7902 Long term (current) use of antithrombotics/antiplatelets: Secondary | ICD-10-CM | POA: Insufficient documentation

## 2023-04-09 HISTORY — PX: ESOPHAGOGASTRODUODENOSCOPY (EGD) WITH PROPOFOL: SHX5813

## 2023-04-09 HISTORY — PX: ESOPHAGEAL DILATION: SHX303

## 2023-04-09 HISTORY — PX: BILIARY STENT PLACEMENT: SHX5538

## 2023-04-09 LAB — GLUCOSE, CAPILLARY
Glucose-Capillary: 125 mg/dL — ABNORMAL HIGH (ref 70–99)
Glucose-Capillary: 124 mg/dL — ABNORMAL HIGH (ref 70–99)

## 2023-04-09 SURGERY — ESOPHAGOGASTRODUODENOSCOPY (EGD) WITH PROPOFOL
Anesthesia: Monitor Anesthesia Care

## 2023-04-09 MED ORDER — PROCHLORPERAZINE 25 MG RE SUPP: 25.0000 mg | Freq: Two times a day (BID) | RECTAL | 0 refills | Status: DC | PRN

## 2023-04-09 MED ORDER — CLOPIDOGREL BISULFATE 75 MG PO TABS: 75.0000 mg | ORAL_TABLET | Freq: Every day | ORAL | Status: DC

## 2023-04-09 MED ORDER — PROCHLORPERAZINE EDISYLATE 10 MG/2ML IJ SOLN
10.0000 mg | Freq: Once | INTRAMUSCULAR | Status: AC
  Administered 2023-04-09: 10 mg via INTRAVENOUS
  Filled 2023-04-09: qty 2

## 2023-04-09 MED ORDER — AMISULPRIDE (ANTIEMETIC) 5 MG/2ML IV SOLN
INTRAVENOUS | Status: AC
  Administered 2023-04-09: 10 mg via INTRAVENOUS
  Filled 2023-04-09: qty 4

## 2023-04-09 MED ORDER — LACTATED RINGERS IV SOLN: INTRAVENOUS | Status: DC

## 2023-04-09 MED ORDER — FENTANYL CITRATE (PF) 100 MCG/2ML IJ SOLN: 25.0000 ug | INTRAMUSCULAR | Status: DC | PRN

## 2023-04-09 MED ORDER — PROCHLORPERAZINE MALEATE 5 MG PO TABS: 5.0000 mg | ORAL_TABLET | Freq: Four times a day (QID) | ORAL | 0 refills | Status: DC | PRN

## 2023-04-09 MED ORDER — AMISULPRIDE (ANTIEMETIC) 5 MG/2ML IV SOLN
10.0000 mg | Freq: Once | INTRAVENOUS | Status: AC
  Filled 2023-04-09: qty 4

## 2023-04-09 MED ORDER — ONDANSETRON HCL 4 MG/2ML IJ SOLN
4.0000 mg | Freq: Once | INTRAMUSCULAR | Status: AC
  Administered 2023-04-09: 4 mg via INTRAVENOUS

## 2023-04-09 MED ORDER — LACTATED RINGERS IV SOLN: INTRAVENOUS | Status: DC | PRN

## 2023-04-09 MED ORDER — OMEPRAZOLE 40 MG PO CPDR
40.0000 mg | DELAYED_RELEASE_CAPSULE | Freq: Every day | ORAL | 12 refills | Status: DC
  Filled 2023-04-09 (×2): qty 60, 60d supply, fill #0

## 2023-04-09 MED ORDER — SODIUM CHLORIDE 0.9 % IV SOLN: INTRAVENOUS | Status: DC

## 2023-04-09 MED ORDER — PROPOFOL 500 MG/50ML IV EMUL
INTRAVENOUS | Status: DC | PRN
  Administered 2023-04-09: 125 ug/kg/min via INTRAVENOUS

## 2023-04-09 MED ORDER — ONDANSETRON HCL 4 MG/2ML IJ SOLN
INTRAMUSCULAR | Status: AC
  Filled 2023-04-09: qty 2

## 2023-04-09 MED ORDER — ONDANSETRON 4 MG PO TBDP
4.0000 mg | ORAL_TABLET | Freq: Four times a day (QID) | ORAL | 0 refills | Status: DC | PRN
Start: 2023-04-09 — End: 2024-07-12
  Filled 2023-04-09 (×2): qty 20, 5d supply, fill #0

## 2023-04-09 MED ORDER — ONDANSETRON HCL 4 MG/2ML IJ SOLN: 4.0000 mg | Freq: Once | INTRAMUSCULAR | Status: DC | PRN

## 2023-04-09 SURGICAL SUPPLY — 15 items

## 2023-04-09 NOTE — Op Note (Addendum)
[facility] Patient Name: Hector Curry Drakos Procedure Date: 04/09/2023 MRN: [id number] Attending MD: Corliss Parish , MD, [id number] Date of Birth: [birth year] CSN: [id number] Age: 62 Admit Type: Outpatient Procedure:                Upper GI endoscopy Indications:              Dysphagia, Exclusion of post-bariatric anastomotic                            stenosis, For therapy of post-bariatric anastomotic                            stenosis Providers:                Corliss Parish, MD, Doristine Mango, RN, Marja Kays, Technician Referring MD:             De Blanch Kinsinger MD, MD Medicines:                Monitored Anesthesia Care Complications:            No immediate complications. Estimated Blood Loss:     Estimated blood loss was minimal. Procedure:                Pre-Anesthesia Assessment:                           - Prior to the procedure, a History and Physical                            was performed, and patient medications and                            allergies were reviewed. The patient's tolerance of                            previous anesthesia was also reviewed. The risks                            and benefits of the procedure and the sedation                            options and risks were discussed with the patient.                            All questions were answered, and informed consent                            was obtained. Prior Anticoagulants: The patient has                            taken no anticoagulant or antiplatelet agents                            except  for aspirin. ASA Grade Assessment: III - A                            patient with severe systemic disease. After                            reviewing the risks and benefits, the patient was                            deemed in satisfactory condition to undergo the                            procedure.                           After  obtaining informed consent, the endoscope was                            passed under direct vision. Throughout the                            procedure, the patient's blood pressure, pulse, and                            oxygen saturations were monitored continuously. The                            GIF-H190 ([id number]) Olympus endoscope was introduced                            through the mouth, and advanced to the lower third                            of esophagus. The [id number]-[id number] ([id number]) Olympus                            therapeutic endoscope was introduced through the                            mouth, and advanced to the body of the stomach. The                            upper GI endoscopy was accomplished without                            difficulty. The patient tolerated the procedure. Scope In: Scope Out: Findings:      No gross lesions were noted in the proximal esophagus and in the mid       esophagus.      LA Grade C (one or more mucosal breaks continuous between tops of 2 or       more mucosal folds, less than 75% circumference) esophagitis with no       bleeding was found in the distal esophagus (40 to 43 cm).      One benign-appearing, intrinsic  severe (stenosis; an endoscope cannot       pass) stenosis was found at the esophageal anastomosis (at 43 cm). This       stenosis measured 4 mm (inner diameter) x less than one cm (in length).       This was stented with In AXIOS 20 mm x 10 mm. To allow the stent to       further dilate, a TTS dilator was passed through the scope. Dilation       with an 04-02-09 mm anastomotic balloon dilator was performed up to 10 mm.      No gross lesions were noted in the visualized proximal gastric body (did       not want to lose position of the just placed enteral stent). Impression:               - No gross lesions in the proximal esophagus and in                            the mid esophagus.                           - LA Grade C  esophagitis with no bleeding found                            distally.                           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                            Benign-appearing esophageal stenosis noted at 4 mm                            in diameter. 20 mm x 10 mm AXIOS LAMS placed.                            Dilated up to 10 mm.                           - No gross lesions in the visualized proximal                            gastric body (did not go deep into the stomach as                            stent was just placed). Moderate Sedation:      Not Applicable - Patient had care per Anesthesia. Recommendation:           - The patient will be observed post-procedure,                            until all discharge criteria are met.                           - Discharge patient to home.                           -  Patient has a contact number available for                            emergencies. The signs and symptoms of potential                            delayed complications were discussed with the                            patient. Return to normal activities tomorrow.                            Written discharge instructions were provided to the                            patient.                           - Clear liquid diet today. Full liquid diet for 2                            days. Then if doing well advance to soft diet.                           - Discontinue Protonix tablets.                           - Initiate omeprazole capsules 40 mg twice daily.                            Recommend opening up the capsule and mixing with                            applesauce to allow absorption to be improved.                            After a month, I would expect patient can then just                            go back to capsule use without opening as the                            anastomosis stricture will have been dilated by                            that time.                            - Continue Carafate 4 times daily. Crush tablet and                            dissolve/mix with 15 mL of fluid.                           -  Patient has not been taking Plavix as a result of                            inability to swallow for the last few months. Thus                            he is only been taking aspirin daily. He is going                            to restart that as soon as he is able to swallow                            which I think will be very soon. I have asked him                            to not reinitiate Plavix for 48 hours (8/17) to                            decrease risk of post interventional bleeding.                           - Observe patient's clinical course.                           - Repeat EGD in 3 months for AXIOS stent removal                            and anastomosis stricture injection with                            triamcinolone to decrease risk of recurrence.                           - Observe patient's clinical course.                           - The findings and recommendations were discussed                            with the patient.                           - The findings and recommendations were discussed                            with the patient's family. Procedure Code(s):        --- Professional ---                           [phone], 52, Esophagogastroduodenoscopy, flexible,                            transoral; with placement of endoscopic stent                            (  includes pre- and post-dilation and guide wire                            passage, when performed) Diagnosis Code(s):        --- Professional ---                           K20.90, Esophagitis, unspecified without bleeding                           Z98.890, Other specified postprocedural states                           K22.2, Esophageal obstruction                           R13.10, Dysphagia, unspecified                            K95.89, Other complications of other bariatric                            procedure CPT copyright 2022 [employer]. All rights reserved. The codes documented in this report are preliminary and upon coder review may  be revised to meet current compliance requirements. Corliss Parish, MD 04/09/2023 4:16:25 PM Number of Addenda: 0

## 2023-04-09 NOTE — Telephone Encounter (Signed)
-----   Message from [facility] sent at 04/09/2023  4:16 PM EDT ----- Regarding: Repeat EGD , This patient needs an AXIOS stent pull via EGD in 3 months.  Thanks. GM

## 2023-04-09 NOTE — Transfer of Care (Signed)
Immediate Anesthesia Transfer of Care Note  Patient: Hector Curry  Procedure(s) Performed: ESOPHAGOGASTRODUODENOSCOPY (EGD) WITH PROPOFOL AXIOS STENT PLACEMENT ESOPHAGEAL DILATION  Patient Location: PACU  Anesthesia Type:MAC  Level of Consciousness: awake and alert   Airway & Oxygen Therapy: Patient Spontanous Breathing and Patient connected to face mask oxygen  Post-op Assessment: Report given to RN and Post -op Vital signs reviewed and stable  Post vital signs: Reviewed and stable  Last Vitals:  Vitals Value Taken Time  BP    Temp    Pulse    Resp    SpO2      Last Pain:  Vitals:   04/09/23 1446  TempSrc: Temporal  PainSc: 0-No pain         Complications: No notable events documented.

## 2023-04-09 NOTE — Discharge Instructions (Addendum)
YOU HAD AN ENDOSCOPIC PROCEDURE TODAY: Refer to the procedure report and other information in the discharge instructions given to you for any specific questions about what was found during the examination. If this information does not answer your questions, please call [place] office at [phone] to clarify.   YOU SHOULD EXPECT: Some feelings of bloating in the abdomen. Passage of more gas than usual. Walking can help get rid of the air that was put into your GI tract during the procedure and reduce the bloating. If you had a lower endoscopy (such as a colonoscopy or flexible sigmoidoscopy) you may notice spotting of blood in your stool or on the toilet paper. Some abdominal soreness may be present for a day or two, also.  DIET: Clear liquids today, advance to full liquids for 2 days, then progress to soft diet as tolerated. Drink plenty of fluids but you should avoid alcoholic beverages for 24 hours. If you had a esophageal dilation, please see attached instructions for diet.    ACTIVITY: Your care partner should take you home directly after the procedure. You should plan to take it easy, moving slowly for the rest of the day. You can resume normal activity the day after the procedure however YOU SHOULD NOT DRIVE, use power tools, machinery or perform tasks that involve climbing or major physical exertion for 24 hours (because of the sedation medicines used during the test).   SYMPTOMS TO REPORT IMMEDIATELY: A gastroenterologist can be reached at any hour. Please call [phone]  for any of the following symptoms:  Following upper endoscopy (EGD, EUS, ERCP, esophageal dilation) Vomiting of blood or coffee ground material  New, significant abdominal pain  New, significant chest pain or pain under the shoulder blades  Painful or persistently difficult swallowing  New shortness of breath  Black, tarry-looking or red, bloody stools  FOLLOW UP:  If any biopsies were taken you will be contacted by  phone or by letter within the next 1-3 weeks. Call [phone]  if you have not heard about the biopsies in 3 weeks.  Please also call with any specific questions about appointments or follow up tests.

## 2023-04-09 NOTE — Progress Notes (Addendum)
Patient evaluated in the postprocedural recovery area. The patient is having nausea postprocedure.  We have given him 8 mg of Zofran. He is going to receive IV dose of Barhemsys in an effort of trying to help with the nausea.  If this does not help the patient's nausea, we will give 1 more dose of Compazine and plan for potential need for observation overnight. I will reach out to the medical service to see about the patient.   Corliss Parish, MD [facility] # [phone]   [phone] addendum Patient still having symptoms post Barhemsys as well as 8 of Zofran. 10 mg Compazine ordered for patient. Chest x-ray and KUB ordered to be done stat. Pending at this time. Patient moved to PACU as endoscopy unit is now closed. Patient's wife with patient at this time.   Corliss Parish, MD [facility] # [phone]    Patient re-evaluated.  Still having nausea.  He has been offered attempt at observation visit vs discharge home.  He would like to go home. Patient will be given compazine PR and Compazine to stagger with Zofran. PR Compazine this evening. Zofran 4 mg Q6H to stagger with Compazine Q6H. His esophageal anastomosis with peristalsis is different than the peristalsis from his previous GJ anastomosis. If he cannot tolerate this further, we will need to remove the stent and then proceed with just balloon dilations moving forward. He agrees to this plan of action.   Corliss Parish, MD [facility] Office # [phone]

## 2023-04-09 NOTE — H&P (Signed)
GASTROENTEROLOGY PROCEDURE H&P NOTE   Primary Care Physician: Marguarite Arbour, MD  HPI: Hector Curry is a 62 y.o. male who presents for EGD for evaluation of anastomosis in setting of issues of dysphagia status post previous bariatric surgery and revision for recurrent anastomotic stricturing.  Past Medical History:  Diagnosis Date   Allergic rhinitis    Atrial tachycardia    CAD (coronary artery disease)    a. 05/08/2015 Cath: no significant CAD, LVEF nl-->Med; b. 07/2017 MV: attenuation artifact, no ischemia, EF 65%-->Low risk; c. 03/2021 NSTEMI/PCI: LM nl, LAD 80p (3.0x18 Onyx Frontier DES), D1 90 (too small for PCI), LCX nl, RCA nl.   Diabetes mellitus without complication (HCC)    Diastolic dysfunction    a. 04/2015 Echo: EF 60-65%; b. 05/2016 Echo: EF 60-65%, GrI DD; c. 07/2017 Echo: EF 55-60%, Ao root 42mm; d. 03/2019 Echo: EF 55-60%, Ao root/Asc Ao 42mm; e. 03/2021 Echo: EF 50-55%, apical HK, mod asymm basal-septal hypertrophy. Nl RV fxn. Asc Ao 44mm.   Dilated aortic root (HCC)    a. 07/2017 Echo: 42mm Ao root - mildly dil; b. 03/2019 Echo: Ao root 42mm; c. 03/2021 Echo: Asc Ao 44mm.   Diverticulosis 10/03/2014   Dysrhythmia    Esophageal stricture    a.  In setting of lap band June 2022-stricture at the GJ anastomosis requiring gastrostomy tube; b. 05/2021 s/p esoph stenting (15mm); c. 07/2021 s/p esoph stenting (20mm).   Family history of premature CAD    a. father passed from MI at 29   GERD (gastroesophageal reflux disease)    GIB (gastrointestinal bleeding)    a. 07/2021 following esoph stenting.   Hemorrhoids    a. internal hemorrhoids s/p surgery 1999   History of kidney stones    History of tobacco abuse    Hyperlipidemia    Hyperplastic colon polyp 10/03/2014   a. x 2    Hypertension    Inflammatory arthritis    a. CCP antibodies & x-rays negative. Rheumatoid factor 14, felt to be crystaline over RA or psoriatic   Iron deficiency anemia 02/05/2022   Morbid  obesity (HCC)    a. s/p LAP-BAND - complicated by esoph stricture.   Myocardial infarction [facility])    NSTEMI   OSA (obstructive sleep apnea)    a. on CPAP   Osteoarthritis    PSVT (paroxysmal supraventricular tachycardia)    a. 48 hr Holter 04/2015: NSR w/ rare PVC, short runs of narrow complex tachycardiac, possible atrial tach, longest run 7 beats, PACs noted (2% of all beats 3600 total) they did not seem to correlate w/ significant arrythmia; b. 08/2017 Event monitor: no significant arrhythmias.   Past Surgical History:  Procedure Laterality Date   BALLOON DILATION N/A 08/14/2021   Procedure: BALLOON DILATION;  Surgeon: Mansouraty, Netty Starring., MD;  Location: [facility]OSCOPY;  Service: Gastroenterology;  Laterality: N/A;   BALLOON DILATION N/A 02/20/2022   Procedure: BALLOON DILATION;  Surgeon: Rachael Fee, MD;  Location: [facility]OSCOPY;  Service: Gastroenterology;  Laterality: N/A;   BALLOON DILATION N/A 03/27/2022   Procedure: BALLOON DILATION;  Surgeon: Meridee Score Netty Starring., MD;  Location: [facility] ENDOSCOPY;  Service: Gastroenterology;  Laterality: N/A;   BARIATRIC SURGERY  01/2021   lap band    BILIARY STENT PLACEMENT N/A 08/14/2021   Procedure: AXIOS STENT PLACEMENT;  Surgeon: Lemar Lofty., MD;  Location: [facility] ENDOSCOPY;  Service: Gastroenterology;  Laterality: N/A;   BILIARY STENT PLACEMENT N/A 10/16/2022   Procedure: STENT PLACEMENT;  Surgeon: Lemar Lofty., MD;  Location: [facility];  Service: Gastroenterology;  Laterality: N/A;   BIOPSY  07/08/2021   Procedure: BIOPSY;  Surgeon: Meridee Score Netty Starring., MD;  Location: [facility]OSCOPY;  Service: Gastroenterology;;   BIOPSY  08/14/2021   Procedure: BIOPSY;  Surgeon: Lemar Lofty., MD;  Location: [facility];  Service: Gastroenterology;;   BIOPSY  10/16/2022   Procedure: BIOPSY;  Surgeon: Lemar Lofty., MD;  Location: [facility] ENDOSCOPY;  Service: Gastroenterology;;   CARDIAC CATHETERIZATION N/A  05/08/2015   Procedure: Left Heart Cath and Coronary Angiography;  Surgeon: Corky Crafts, MD;  Location: [facility] INVASIVE CV LAB;  Service: Cardiovascular;  Laterality: N/A;   CARPAL TUNNEL RELEASE Bilateral    CHOLECYSTECTOMY     COLONOSCOPY  06/27/2005   COLONOSCOPY  10/03/2014   CORONARY STENT INTERVENTION N/A 04/15/2021   Procedure: CORONARY STENT INTERVENTION;  Surgeon: Runell Gess, MD;  Location: [facility] INVASIVE CV LAB;  Service: Cardiovascular;  Laterality: N/A;   DUODENAL STENT PLACEMENT N/A 11/18/2021   Procedure: DUODENAL STENT PLACEMENT;  Surgeon: Meridee Score Netty Starring., MD;  Location: [facility] ENDOSCOPY;  Service: Gastroenterology;  Laterality: N/A;  axios placed at GJ anastamosis   DUODENAL STENT PLACEMENT  03/27/2022   Procedure: GASTRIC STENT PLACEMENT;  Surgeon: Meridee Score Netty Starring., MD;  Location: [facility] ENDOSCOPY;  Service: Gastroenterology;;   ESOPHAGEAL DILATION  05/27/2021   Procedure: ESOPHAGEAL DILATION;  Surgeon: Lemar Lofty., MD;  Location: [facility];  Service: Gastroenterology;;   ESOPHAGEAL DILATION  01/23/2022   Procedure: ESOPHAGEAL DILATION;  Surgeon: Lemar Lofty., MD;  Location: [facility] ENDOSCOPY;  Service: Gastroenterology;;   ESOPHAGEAL STENT PLACEMENT N/A 05/27/2021   Procedure: ESOPHAGEAL STENT PLACEMENT;  Surgeon: Lemar Lofty., MD;  Location: [facility]OSCOPY;  Service: Gastroenterology;  Laterality: N/A;   ESOPHAGOGASTRODUODENOSCOPY  06/27/2005   ESOPHAGOGASTRODUODENOSCOPY N/A 03/19/2021   Procedure: ESOPHAGOGASTRODUODENOSCOPY (EGD);  Surgeon: Sheliah Hatch, De Blanch, MD;  Location: [facility] ENDOSCOPY;  Service: General;  Laterality: N/A;   ESOPHAGOGASTRODUODENOSCOPY N/A 08/16/2021   Procedure: ESOPHAGOGASTRODUODENOSCOPY (EGD);  Surgeon: Lemar Lofty., MD;  Location: [facility] ENDOSCOPY;  Service: Gastroenterology;  Laterality: N/A;   ESOPHAGOGASTRODUODENOSCOPY (EGD) WITH PROPOFOL N/A 05/27/2021   Procedure: ESOPHAGOGASTRODUODENOSCOPY  (EGD) WITH PROPOFOL;  Surgeon: Meridee Score Netty Starring., MD;  Location: [facility]OSCOPY;  Service: Gastroenterology;  Laterality: N/A;   ESOPHAGOGASTRODUODENOSCOPY (EGD) WITH PROPOFOL N/A 07/08/2021   Procedure: ESOPHAGOGASTRODUODENOSCOPY (EGD) WITH PROPOFOL;  Surgeon: Meridee Score Netty Starring., MD;  Location: [facility] ENDOSCOPY;  Service: Gastroenterology;  Laterality: N/A;  fluoro   ESOPHAGOGASTRODUODENOSCOPY (EGD) WITH PROPOFOL N/A 08/14/2021   Procedure: ESOPHAGOGASTRODUODENOSCOPY (EGD) WITH PROPOFOL;  Surgeon: Meridee Score Netty Starring., MD;  Location: [facility] ENDOSCOPY;  Service: Gastroenterology;  Laterality: N/A;  fluoro Axios stent (20 mm)   ESOPHAGOGASTRODUODENOSCOPY (EGD) WITH PROPOFOL N/A 10/16/2021   Procedure: ESOPHAGOGASTRODUODENOSCOPY (EGD) WITH PROPOFOL;  Surgeon: Meridee Score Netty Starring., MD;  Location: [facility] ENDOS[facility];  Service: Gastroenterology;  Laterality: N/A;  axios stent pull fluoro   ESOPHAGOGASTRODUODENOSCOPY (EGD) WITH PROPOFOL N/A 11/18/2021   Procedure: ESOPHAGOGASTRODUODENOSCOPY (EGD) WITH PROPOFOL;  Surgeon: Meridee Score Netty Starring., MD;  Location: [facility];  Service: Gastroenterology;  Laterality: N/A;   ESOPHAGOGASTRODUODENOSCOPY (EGD) WITH PROPOFOL N/A 01/23/2022   Procedure: ESOPHAGOGASTRODUODENOSCOPY (EGD) WITH PROPOFOL;  Surgeon: Meridee Score Netty Starring., MD;  Location: [facility] ENDOSCOPY;  Service: Gastroenterology;  Laterality: N/A;   ESOPHAGOGASTRODUODENOSCOPY (EGD) WITH PROPOFOL N/A 02/20/2022   Procedure: ESOPHAGOGASTRODUODENOSCOPY (EGD) WITH PROPOFOL;  Surgeon: Rachael Fee, MD;  Location: [facility] ENDOSCOPY;  Service: Gastroenterology;  Laterality: N/A;   ESOPHAGOGASTRODUODENOSCOPY (EGD) WITH PROPOFOL  N/A 03/27/2022   Procedure: ESOPHAGOGASTRODUODENOSCOPY (EGD) WITH PROPOFOL;  Surgeon: Meridee Score Netty Starring., MD;  Location: [facility] ENDOSCOPY;  Service: Gastroenterology;  Laterality: N/A;   ESOPHAGOGASTRODUODENOSCOPY (EGD) WITH PROPOFOL N/A 08/14/2022   Procedure: ESOPHAGOGASTRODUODENOSCOPY  (EGD) WITH PROPOFOL;  Surgeon: Meridee Score Netty Starring., MD;  Location: [facility] ENDOSCOPY;  Service: Gastroenterology;  Laterality: N/A;   ESOPHAGOGASTRODUODENOSCOPY (EGD) WITH PROPOFOL N/A 10/16/2022   Procedure: ESOPHAGOGASTRODUODENOSCOPY (EGD) WITH PROPOFOL;  Surgeon: Meridee Score Netty Starring., MD;  Location: [facility] ENDOSCOPY;  Service: Gastroenterology;  Laterality: N/A;   ESOPHAGOGASTRODUODENOSCOPY (EGD) WITH PROPOFOL N/A 11/28/2022   Procedure: ESOPHAGOGASTRODUODENOSCOPY (EGD) WITH PROPOFOL WITH AXIOS STENT REMOVAL;  Surgeon: Meridee Score Netty Starring., MD;  Location: [facility] ENDOSCOPY;  Service: Gastroenterology;  Laterality: N/A;   FLEXIBLE SIGMOIDOSCOPY N/A 08/16/2021   Procedure: FLEXIBLE SIGMOIDOSCOPY;  Surgeon: Meridee Score Netty Starring., MD;  Location: [facility] ENDOSCOPY;  Service: Gastroenterology;  Laterality: N/A;   FOREIGN BODY REMOVAL  02/20/2022   Procedure: FOREIGN BODY REMOVAL;  Surgeon: Rachael Fee, MD;  Location: [facility] ENDOSCOPY;  Service: Gastroenterology;;   FOREIGN BODY REMOVAL ESOPHAGEAL  03/27/2022   Procedure: REMOVAL FOREIGN BODY;  Surgeon: Meridee Score Netty Starring., MD;  Location: [facility] ENDOSCOPY;  Service: Gastroenterology;;   GASTRIC ROUX-EN-Y N/A 02/18/2021   Procedure: LAPAROSCOPIC ROUX-EN-Y GASTRIC BYPASS WITH UPPER ENDOSCOPY,;  Surgeon: Sheliah Hatch De Blanch, MD;  Location: [facility] ORS;  Service: General;  Laterality: N/A;   GASTRIC ROUX-EN-Y N/A 12/02/2022   Procedure: LAPAROSCOPIC converted to OPEN GASTRIC BYPASS REVERSAL, TAKEDOWN OF GASTROJEJUNAL ANASTOMOSIS, GASTRO GASTRIC ANASTOMOSIS WITH UPPER ENDOSCOPY 1. Laparoscopic lysis of adhesions 2. Partial gastrectomy 3. Small bowel resection 4. Creation of esophagus to gastric anastomosis 5. Creation of gastrostomy;  Surgeon: Kinsinger, De Blanch, MD;  Location: [facility] ORS;  Service: General;  Laterality:    GASTROINTESTINAL STENT REMOVAL  01/23/2022   Procedure: AXIOS STENT REMOVAL;  Surgeon: Lemar Lofty., MD;  Location: [facility] ENDOSCOPY;  Service:  Gastroenterology;;   GASTROSTOMY N/A 12/02/2022   Procedure: INSERTION OF GASTROSTOMY TUBE;  Surgeon: Rodman Pickle, MD;  Location: [facility] ORS;  Service: General;  Laterality: N/A;   HEMORRHOID SURGERY  08/25/1997   IR GJ TUBE CHANGE  03/27/2021   LAPAROSCOPIC INSERTION GASTROSTOMY TUBE N/A 03/20/2021   Procedure: LAPAROSCOPIC INSERTION GASTROSTOMY TUBE;  Surgeon: Rodman Pickle, MD;  Location: [facility] ORS;  Service: General;  Laterality: N/A;   LEFT HEART CATH AND CORONARY ANGIOGRAPHY N/A 04/15/2021   Procedure: LEFT HEART CATH AND CORONARY ANGIOGRAPHY;  Surgeon: Runell Gess, MD;  Location: [facility] INVASIVE CV LAB;  Service: Cardiovascular;  Laterality: N/A;   MOUTH SURGERY     removed area which was benign   STENT REMOVAL  07/08/2021   Procedure: STENT REMOVAL;  Surgeon: Lemar Lofty., MD;  Location: [facility] ENDOSCOPY;  Service: Gastroenterology;;   Francine Graven REMOVAL  10/16/2021   Procedure: STENT REMOVAL;  Surgeon: Lemar Lofty., MD;  Location: [facility] ENDOSCOPY;  Service: Gastroenterology;;   Francine Graven REMOVAL  08/14/2022   Procedure: STENT REMOVAL;  Surgeon: Lemar Lofty., MD;  Location: [facility] ENDOSCOPY;  Service: Gastroenterology;;   Francine Graven REMOVAL  11/28/2022   Procedure: STENT REMOVAL;  Surgeon: Lemar Lofty., MD;  Location: [facility] ENDOSCOPY;  Service: Gastroenterology;;   STERIOD INJECTION  01/23/2022   Procedure: STEROID INJECTION;  Surgeon: Lemar Lofty., MD;  Location: [facility] ENDOSCOPY;  Service: Gastroenterology;;   SUBMUCOSAL INJECTION  11/18/2021   Procedure: SUBMUCOSAL INJECTION;  Surgeon: Lemar Lofty., MD;  Location: [facility] ENDOSCOPY;  Service: Gastroenterology;;   Sunnie Nielsen INJECTION  08/14/2022  Procedure: SUBMUCOSAL INJECTION;  Surgeon: Lemar Lofty., MD;  Location: [facility] ENDOSCOPY;  Service: Gastroenterology;;   TONSILLECTOMY     UPPER GI ENDOSCOPY N/A 03/20/2021   Procedure: UPPER GI ENDOSCOPY;  Surgeon: Sheliah Hatch De Blanch,  MD;  Location: [facility] ORS;  Service: General;  Laterality: N/A;   No current facility-administered medications for this encounter.   No current facility-administered medications for this encounter. Allergies  Allergen Reactions   Other Itching and Other (See Comments)    Cats Wheezing, runny nose/eyes, congestion    Family History  Problem Relation Age of Onset   Hypertension Mother    Diabetes Mother    Heart attack Father 10   CAD Father    Hypertension Sister    Diabetes Other    Hypertension Other    Heart disease Other    Colon cancer Neg Hx    Esophageal cancer Neg Hx    Pancreatic cancer Neg Hx    Stomach cancer Neg Hx    Liver disease Neg Hx    Inflammatory bowel disease Neg Hx    Rectal cancer Neg Hx    Social History   Socioeconomic History   Marital status: Married    Spouse name: Not on file   Number of children: 3   Years of education: Not on file   Highest education level: Not on file  Occupational History   Occupation: IT IT trainer  Tobacco Use   Smoking status: Former    Current packs/day: 0.00    Average packs/day: 1 pack/day for 15.0 years (15.0 ttl pk-yrs)    Types: Cigarettes    Start date: 08/05/1974    Quit date: 08/05/1989    Years since quitting: 33.6   Smokeless tobacco: Never  Vaping Use   Vaping status: Never Used  Substance and Sexual Activity   Alcohol use: Yes    Comment: occ   Drug use: Never   Sexual activity: Not Currently  Other Topics Concern   Not on file  Social History Narrative   Not on file   Social Determinants of Health   Financial Resource Strain: Not on file  Food Insecurity: No Food Insecurity (12/02/2022)   Hunger Vital Sign    Worried About Running Out of Food in the Last Year: Never true    Ran Out of Food in the Last Year: Never true  Transportation Needs: No Transportation Needs (12/02/2022)   PRAPARE - Administrator, Civil Service (Medical): No    Lack of Transportation (Non-Medical): No   Physical Activity: Not on file  Stress: Not on file  Social Connections: Not on file  Intimate Partner Violence: Not At Risk (12/02/2022)   Humiliation, Afraid, Rape, and Kick questionnaire    Fear of Current or Ex-Partner: No    Emotionally Abused: No    Physically Abused: No    Sexually Abused: No    Physical Exam: There were no vitals filed for this visit. There is no height or weight on file to calculate BMI. GEN: NAD EYE: Sclerae anicteric ENT: MMM CV: Non-tachycardic GI: Soft, NT/ND NEURO:  Alert & Oriented x 3  Lab Results: No results for input(s): "WBC", "HGB", "HCT", "PLT" in the last 72 hours. BMET No results for input(s): "NA", "K", "CL", "CO2", "GLUCOSE", "BUN", "CREATININE", "CALCIUM" in the last 72 hours. LFT No results for input(s): "PROT", "ALBUMIN", "AST", "ALT", "ALKPHOS", "BILITOT", "BILIDIR", "IBILI" in the last 72 hours. PT/INR No results for input(s): "LABPROT", "INR" in the  last 72 hours.   Impression / Plan: This is a 62 y.o.male who presents for EGD for evaluation of anastomosis in setting of issues of dysphagia status post previous bariatric surgery and revision for recurrent anastomotic stricturing.   The risks and benefits of endoscopic evaluation/treatment were discussed with the patient and/or family; these include but are not limited to the risk of perforation, infection, bleeding, missed lesions, lack of diagnosis, severe illness requiring hospitalization, as well as anesthesia and sedation related illnesses.  The patient's history has been reviewed, patient examined, no change in status, and deemed stable for procedure.  The patient and/or family is agreeable to proceed.    Corliss Parish, MD [facility] Advanced [facility]oscopy Office # [phone]

## 2023-04-09 NOTE — Anesthesia Postprocedure Evaluation (Signed)
Anesthesia Post Note  Patient: Hector Curry  Procedure(s) Performed: ESOPHAGOGASTRODUODENOSCOPY (EGD) WITH PROPOFOL AXIOS STENT PLACEMENT ESOPHAGEAL DILATION     Patient location during evaluation: PACU Anesthesia Type: MAC Level of consciousness: awake and alert and oriented Pain management: pain level controlled Vital Signs Assessment: post-procedure vital signs reviewed and stable Respiratory status: spontaneous breathing, nonlabored ventilation and respiratory function stable Cardiovascular status: stable and blood pressure returned to baseline Anesthetic complications: no   No notable events documented.  Last Vitals:  Vitals:   04/09/23 1830 04/09/23 1845  BP: (!) 164/92 (!) 157/90  Pulse: 74 76  Resp: 17 16  Temp:  36.6 C  SpO2: 94% 96%    Last Pain:  Vitals:   04/09/23 1845  TempSrc:   PainSc: 0-No pain                 , A.

## 2023-04-09 NOTE — Progress Notes (Signed)
Pt c/o nausea on arrival to recovery from procedure. Dr Malen Gauze contacted and order for Ondansetron 4mg  given. Dr Meridee Score r/v patient in recovery and asked for additional 4mg  Ondansetron. 8mg  Ondansetron given IV. Pt vomited approx clear fluid and saliva post Ondansetron. Dr Malen Gauze contacted and order for 10mg  Barhemysys. Same given. Mentioned high BP- bp up tp 175/111. Curtrently settled at 168/89. Pain controlled. Further vomiting post Barhemsys. Discussed with Dr Meridee Score and pt for CXR and KUB. Order for Compazine given but await effect of Barhemsys first.

## 2023-04-09 NOTE — Progress Notes (Signed)
Pt transferred to PACU for further care. Dr Meridee Score would like nausea reassessed and if cont, can give Compazine as ordered. BP improved. Cont to deny pain. Cont to monitor. Await x-ray.

## 2023-04-10 ENCOUNTER — Other Ambulatory Visit: Payer: Self-pay

## 2023-04-10 DIAGNOSIS — K9189 Other postprocedural complications and disorders of digestive system: Secondary | ICD-10-CM

## 2023-04-10 DIAGNOSIS — R1310 Dysphagia, unspecified: Secondary | ICD-10-CM

## 2023-04-10 NOTE — Telephone Encounter (Signed)
Dr Meridee Score I have tried to reach the pt a couple of times today with no answer-FYI

## 2023-04-10 NOTE — Telephone Encounter (Signed)
[name], thanks for update. I called and spoke with the patient's wife this afternoon as well. He has tried to drink more fluids and so far is OK, but it is not an adequate amount <20 ounces total today. Thankfully no vomiting. I think issues with this AXIOS stent is that it was placed for an Esophagogastric anastomosis and I think, he probably is having continued peristalsis from his esophageal contractions and this is causing him more issues than when we had placed previous AXIOS stents in his gastrojejunostomy stenosis. I told patient's wife, to continue to monitor closely and if he is not increasing his fluids significantly over the next 12-24 hours, he will need to come into the hospital ([facility] or [facility]) for fluid hydration and likely EGD with stent removal. I have full confidence in my colleagues covering  this weekend with ability to remove this foreign body. If he needs removal of AXIOS stent, but there is continued concerns about ability to remove this, then I will be back on Monday and I can be of assistance with removing this with Anesthesia assistance at either hospital. I am hopeful he has further improvement, but if not, they know now to wait over the weekend if things progress/worsen. I will leave an updated message to my colleagues covering this weekend. GM

## 2023-04-10 NOTE — Telephone Encounter (Signed)
Left message on machine to call back  

## 2023-04-10 NOTE — Telephone Encounter (Signed)
-----   Message from [facility] sent at 04/10/2023  5:09 AM EDT ----- Regarding: Follow-up with patient Hector Curry, This patient was almost admitted last night after his EGD. He was having lots of nausea and inability to tolerate his secretions. We are hopeful that this would subside with the stent being placed overnight. Please check in on him and update me this afternoon as to how he is doing. Thanks. GM

## 2023-04-10 NOTE — Telephone Encounter (Signed)
Dr Meridee Score the pt wife called back and she tells me that Hector Curry has slept all day and is still nauseous.  He has only been able to get in 8 oz of fluids all day.  He has not vomited however.    He has been advised of the repeat EGD appt information

## 2023-04-11 ENCOUNTER — Observation Stay (HOSPITAL_COMMUNITY)
Admission: EM | Admit: 2023-04-11 | Discharge: 2023-04-12 | Disposition: A | Discharge status: Home / Self Care | Payer: Managed Care, Other (non HMO) | Attending: Internal Medicine | Admitting: Internal Medicine

## 2023-04-11 ENCOUNTER — Encounter (HOSPITAL_COMMUNITY)
Admission: EM | Disposition: A | Discharge status: Home / Self Care | Payer: Self-pay | Source: Home / Self Care | Attending: Emergency Medicine

## 2023-04-11 ENCOUNTER — Other Ambulatory Visit: Payer: Self-pay

## 2023-04-11 ENCOUNTER — Observation Stay (HOSPITAL_COMMUNITY): Payer: Managed Care, Other (non HMO) | Admitting: Certified Registered"

## 2023-04-11 ENCOUNTER — Observation Stay (HOSPITAL_BASED_OUTPATIENT_CLINIC_OR_DEPARTMENT_OTHER): Payer: Managed Care, Other (non HMO) | Admitting: Certified Registered"

## 2023-04-11 ENCOUNTER — Encounter (HOSPITAL_COMMUNITY): Payer: Self-pay

## 2023-04-11 DIAGNOSIS — Z955 Presence of coronary angioplasty implant and graft: Secondary | ICD-10-CM | POA: Diagnosis not present

## 2023-04-11 DIAGNOSIS — R112 Nausea with vomiting, unspecified: Secondary | ICD-10-CM | POA: Diagnosis present

## 2023-04-11 DIAGNOSIS — Z978 Presence of other specified devices: Secondary | ICD-10-CM | POA: Insufficient documentation

## 2023-04-11 DIAGNOSIS — Z87891 Personal history of nicotine dependence: Secondary | ICD-10-CM

## 2023-04-11 DIAGNOSIS — Z7982 Long term (current) use of aspirin: Secondary | ICD-10-CM | POA: Diagnosis not present

## 2023-04-11 DIAGNOSIS — T18198A Other foreign object in esophagus causing other injury, initial encounter: Secondary | ICD-10-CM

## 2023-04-11 DIAGNOSIS — Z79899 Other long term (current) drug therapy: Secondary | ICD-10-CM | POA: Insufficient documentation

## 2023-04-11 DIAGNOSIS — E119 Type 2 diabetes mellitus without complications: Secondary | ICD-10-CM | POA: Insufficient documentation

## 2023-04-11 DIAGNOSIS — I2510 Atherosclerotic heart disease of native coronary artery without angina pectoris: Secondary | ICD-10-CM

## 2023-04-11 DIAGNOSIS — R066 Hiccough: Secondary | ICD-10-CM

## 2023-04-11 DIAGNOSIS — K209 Esophagitis, unspecified without bleeding: Secondary | ICD-10-CM

## 2023-04-11 DIAGNOSIS — I10 Essential (primary) hypertension: Secondary | ICD-10-CM | POA: Insufficient documentation

## 2023-04-11 DIAGNOSIS — Z794 Long term (current) use of insulin: Secondary | ICD-10-CM | POA: Insufficient documentation

## 2023-04-11 DIAGNOSIS — Z7902 Long term (current) use of antithrombotics/antiplatelets: Secondary | ICD-10-CM | POA: Diagnosis not present

## 2023-04-11 DIAGNOSIS — K222 Esophageal obstruction: Secondary | ICD-10-CM

## 2023-04-11 DIAGNOSIS — K2289 Other specified disease of esophagus: Secondary | ICD-10-CM

## 2023-04-11 HISTORY — DX: Nausea with vomiting, unspecified: R11.2

## 2023-04-11 HISTORY — PX: ESOPHAGOGASTRODUODENOSCOPY (EGD) WITH PROPOFOL: SHX5813

## 2023-04-11 HISTORY — PX: STENT REMOVAL: SHX6421

## 2023-04-11 LAB — COMPREHENSIVE METABOLIC PANEL
ALT: 19 U/L (ref 0–44)
AST: 19 U/L (ref 15–41)
Albumin: 3.7 g/dL (ref 3.5–5.0)
Alkaline Phosphatase: 104 U/L (ref 38–126)
Anion gap: 12 (ref 5–15)
BUN: 8 mg/dL (ref 6–20)
CO2: 27 mmol/L (ref 22–32)
Calcium: 8.9 mg/dL (ref 8.9–10.3)
Chloride: 95 mmol/L — ABNORMAL LOW (ref 98–111)
Creatinine, Ser: 0.57 mg/dL — ABNORMAL LOW (ref 0.61–1.24)
GFR, Estimated: >60 mL/min (ref >60)
Glucose, Bld: 157 mg/dL — ABNORMAL HIGH (ref 70–99)
Potassium: 3.6 mmol/L (ref 3.5–5.1)
Sodium: 134 mmol/L — ABNORMAL LOW (ref 135–145)
Total Bilirubin: 1.8 mg/dL — ABNORMAL HIGH (ref 0.3–1.2)
Total Protein: 6.9 g/dL (ref 6.5–8.1)

## 2023-04-11 LAB — CBC WITH DIFFERENTIAL/PLATELET
Abs Immature Granulocytes: 0.03 10*3/uL (ref 0.00–0.07)
Basophils Absolute: 0 10*3/uL (ref 0.0–0.1)
Basophils Relative: 0 %
Eosinophils Absolute: 0 10*3/uL (ref 0.0–0.5)
Eosinophils Relative: 0 %
HCT: 49.1 % (ref 39.0–52.0)
Hemoglobin: 16.4 g/dL (ref 13.0–17.0)
Immature Granulocytes: 0 %
Lymphocytes Relative: 10 %
Lymphs Abs: 1 10*3/uL (ref 0.7–4.0)
MCH: 28.6 pg (ref 26.0–34.0)
MCHC: 33.4 g/dL (ref 30.0–36.0)
MCV: 85.5 fL (ref 80.0–100.0)
Monocytes Absolute: 0.7 10*3/uL (ref 0.1–1.0)
Monocytes Relative: 7 %
Neutro Abs: 7.9 10*3/uL — ABNORMAL HIGH (ref 1.7–7.7)
Neutrophils Relative %: 83 %
Platelets: 219 10*3/uL (ref 150–400)
RBC: 5.74 MIL/uL (ref 4.22–5.81)
RDW: 13.8 % (ref 11.5–15.5)
WBC: 9.5 10*3/uL (ref 4.0–10.5)
nRBC: 0 % (ref 0.0–0.2)

## 2023-04-11 LAB — HIV ANTIBODY (ROUTINE TESTING W REFLEX): HIV Screen 4th Generation wRfx: NONREACTIVE

## 2023-04-11 LAB — GLUCOSE, CAPILLARY: Glucose-Capillary: 151 mg/dL — ABNORMAL HIGH (ref 70–99)

## 2023-04-11 SURGERY — ESOPHAGOGASTRODUODENOSCOPY (EGD) WITH PROPOFOL
Anesthesia: Monitor Anesthesia Care

## 2023-04-11 MED ORDER — ACETAMINOPHEN 325 MG PO TABS: 650.0000 mg | ORAL_TABLET | Freq: Four times a day (QID) | ORAL | Status: DC | PRN

## 2023-04-11 MED ORDER — MORPHINE SULFATE (PF) 2 MG/ML IV SOLN: 1.0000 mg | INTRAVENOUS | Status: DC | PRN

## 2023-04-11 MED ORDER — LORATADINE 10 MG PO TABS: 10.0000 mg | ORAL_TABLET | Freq: Every day | ORAL | Status: DC

## 2023-04-11 MED ORDER — LACTATED RINGERS IV SOLN
INTRAVENOUS | Status: AC | PRN
  Administered 2023-04-11: 10 mL/h via INTRAVENOUS

## 2023-04-11 MED ORDER — DEXAMETHASONE SODIUM PHOSPHATE 10 MG/ML IJ SOLN
INTRAMUSCULAR | Status: DC | PRN
  Administered 2023-04-11: 8 mg via INTRAVENOUS

## 2023-04-11 MED ORDER — SODIUM CHLORIDE 0.9 % IV BOLUS
1000.0000 mL | Freq: Once | INTRAVENOUS | Status: AC
  Administered 2023-04-11: 1000 mL via INTRAVENOUS

## 2023-04-11 MED ORDER — CLOPIDOGREL BISULFATE 75 MG PO TABS
75.0000 mg | ORAL_TABLET | Freq: Every day | ORAL | Status: DC
  Filled 2023-04-11 (×2): qty 1

## 2023-04-11 MED ORDER — FENTANYL CITRATE (PF) 100 MCG/2ML IJ SOLN
INTRAMUSCULAR | Status: DC | PRN
  Administered 2023-04-11 (×2): 50 ug via INTRAVENOUS

## 2023-04-11 MED ORDER — FLUTICASONE PROPIONATE 50 MCG/ACT NA SUSP: 2.0000 | Freq: Every day | NASAL | Status: DC | PRN

## 2023-04-11 MED ORDER — PROPOFOL 10 MG/ML IV BOLUS
INTRAVENOUS | Status: AC
  Filled 2023-04-11: qty 20

## 2023-04-11 MED ORDER — SODIUM CHLORIDE 0.9 % IV SOLN: INTRAVENOUS | Status: DC

## 2023-04-11 MED ORDER — SUCCINYLCHOLINE CHLORIDE 200 MG/10ML IV SOSY
PREFILLED_SYRINGE | INTRAVENOUS | Status: DC | PRN
  Administered 2023-04-11: 100 mg via INTRAVENOUS

## 2023-04-11 MED ORDER — LIDOCAINE HCL (CARDIAC) PF 100 MG/5ML IV SOSY
PREFILLED_SYRINGE | INTRAVENOUS | Status: DC | PRN
  Administered 2023-04-11: 60 mg via INTRATRACHEAL

## 2023-04-11 MED ORDER — PROPOFOL 10 MG/ML IV BOLUS
INTRAVENOUS | Status: DC | PRN
  Administered 2023-04-11: 150 mg via INTRAVENOUS
  Administered 2023-04-11: 50 mg via INTRAVENOUS

## 2023-04-11 MED ORDER — PANTOPRAZOLE SODIUM 40 MG IV SOLR: 40.0000 mg | INTRAVENOUS | Status: DC

## 2023-04-11 MED ORDER — ONDANSETRON HCL 4 MG/2ML IJ SOLN: 4.0000 mg | Freq: Four times a day (QID) | INTRAMUSCULAR | Status: DC | PRN

## 2023-04-11 MED ORDER — ONDANSETRON HCL 4 MG PO TABS: 4.0000 mg | ORAL_TABLET | Freq: Four times a day (QID) | ORAL | Status: DC | PRN

## 2023-04-11 MED ORDER — ENOXAPARIN SODIUM 40 MG/0.4ML IJ SOSY
40.0000 mg | PREFILLED_SYRINGE | Freq: Every day | INTRAMUSCULAR | Status: DC
  Administered 2023-04-11 – 2023-04-12 (×2): 40 mg via SUBCUTANEOUS
  Filled 2023-04-11 (×2): qty 0.4

## 2023-04-11 MED ORDER — FENTANYL CITRATE (PF) 100 MCG/2ML IJ SOLN
INTRAMUSCULAR | Status: AC
  Filled 2023-04-11: qty 2

## 2023-04-11 MED ORDER — ACETAMINOPHEN 650 MG RE SUPP: 650.0000 mg | Freq: Four times a day (QID) | RECTAL | Status: DC | PRN

## 2023-04-11 MED ORDER — ALBUTEROL SULFATE (2.5 MG/3ML) 0.083% IN NEBU: 2.5000 mg | INHALATION_SOLUTION | RESPIRATORY_TRACT | Status: DC | PRN

## 2023-04-11 MED ORDER — SODIUM CHLORIDE 0.9 % IV SOLN
8.0000 mg | Freq: Once | INTRAVENOUS | Status: AC
  Administered 2023-04-11: 8 mg via INTRAVENOUS
  Filled 2023-04-11: qty 4

## 2023-04-11 SURGICAL SUPPLY — 15 items

## 2023-04-11 NOTE — Anesthesia Postprocedure Evaluation (Signed)
Anesthesia Post Note  Patient: Hector Curry  Procedure(s) Performed: ESOPHAGOGASTRODUODENOSCOPY (EGD) WITH PROPOFOL STENT REMOVAL     Patient location during evaluation: PACU Anesthesia Type: General Level of consciousness: awake and alert, oriented and patient cooperative Pain management: pain level controlled Vital Signs Assessment: post-procedure vital signs reviewed and stable Respiratory status: spontaneous breathing, nonlabored ventilation and respiratory function stable Cardiovascular status: blood pressure returned to baseline and stable Postop Assessment: no apparent nausea or vomiting Anesthetic complications: no   No notable events documented.  Last Vitals:  Vitals:   04/11/23 1500 04/11/23 1510  BP: (!) 151/89 (!) 144/76  Pulse: 78 79  Resp: 16 18  Temp:    SpO2: 100% 100%    Last Pain:  Vitals:   04/11/23 1500  TempSrc:   PainSc: 0-No pain                 Lannie Fields

## 2023-04-11 NOTE — Transfer of Care (Signed)
Immediate Anesthesia Transfer of Care Note  Patient: Hector Curry  Procedure(s) Performed: ESOPHAGOGASTRODUODENOSCOPY (EGD) WITH PROPOFOL STENT REMOVAL  Patient Location: PACU  Anesthesia Type:General  Level of Consciousness: awake and patient cooperative  Airway & Oxygen Therapy: Patient Spontanous Breathing and Patient connected to nasal cannula oxygen  Post-op Assessment: Report given to RN and Post -op Vital signs reviewed and stable  Post vital signs: Reviewed and stable  Last Vitals:  Vitals Value Taken Time  BP 151/97 04/11/23 1450  Temp 36.7 C 04/11/23 1447  Pulse 80 04/11/23 1456  Resp 16 04/11/23 1456  SpO2 100 % 04/11/23 1456  Vitals shown include unfiled device data.  Last Pain:  Vitals:   04/11/23 1447  TempSrc: Temporal  PainSc: 0-No pain         Complications: No notable events documented.

## 2023-04-11 NOTE — ED Provider Notes (Cosign Needed Addendum)
[facility] AT [facility] Provider Note   CSN: [id number] Arrival date & time: 04/11/23  2841     History  Chief Complaint  Patient presents with   Emesis    [facility] is a 62 y.o. male.  Patient with history of diabetes, high cholesterol -- presents to the emergency department today for evaluation of poor oral intake.  Patient has a history of [facility] surgery status post reversal.  This was complicated by anastomotic strictures.  He had Axios stent placed 2 days ago at the distal esophagus into the stomach by [facility].  He had significant nausea and vomiting after the procedure and was nearly admitted for observation.  He did go home.  Patient's wife at bedside states that he has had maybe 10 ounces of fluid intake over the past 2 days.  Small boluses of liquid cause intense nausea which is associated with painful hiccups and belching.  Other than that, no significant pain.  No diarrhea.  No fevers.  He has been in contact with [facility] who recommended that he come in for possible stent removal if symptoms are persistent.  Patient did have a feeding tube in however this spontaneously came out about 6 days ago.  It was not replaced, hoping that they will no longer rely on it.  Prior to that, patient was using it to supplement oral intake.       Home Medications Prior to Admission medications   Medication Sig Start Date End Date Taking? Authorizing Provider  aspirin EC 81 MG tablet Take 1 tablet (81 mg total) by mouth daily. Use while holding Plavix. 08/14/22 08/14/23  [facility], Netty Starring., MD  atorvastatin (LIPITOR) 10 MG tablet Take 1 tablet (10 mg total) by mouth daily. 09/01/22 12/02/22  Hector Iba, MD  clopidogrel (PLAVIX) 75 MG tablet Take 1 tablet (75 mg total) by mouth daily. 04/11/23   [facility], Netty Starring., MD  Continuous Glucose Sensor (FREESTYLE LIBRE 14 DAY SENSOR) MISC Insert 1 sensor to the back of the arm as directed every 14 days  for continuous glucose monitoring 11/24/22     fluticasone (FLONASE) 50 MCG/ACT nasal spray Place 2-3 sprays into both nostrils daily as needed for allergies or rhinitis.    [provider]  insulin lispro (HUMALOG) 100 UNIT/ML KwikPen Inject subcutaneously 3 (three) times daily with meals Per sliding scale 11/24/22     Insulin Pen Needle (UNIFINE PENTIPS) 32G X 4 MM MISC Use as needed as directed 12/22/22     loratadine (CLARITIN) 10 MG tablet Take 10 mg by mouth daily.    [provider]  mometasone (ELOCON) 0.1 % lotion Apply to affected areas once daily Patient taking differently: Apply 1 Application topically as needed (psoriasis). 11/19/21     Multiple Vitamin (MULTI-VITAMIN DAILY PO) Take 1 patch by mouth daily.    [provider]  nitroGLYCERIN (NITROSTAT) 0.4 MG SL tablet Place 1 tablet (0.4 mg total) under the tongue every 5 (five) minutes as needed for chest pain. 04/25/21 11/19/22  Hector Iba, MD  omeprazole (PRILOSEC) 40 MG capsule Take 1 capsule (40 mg total) by mouth daily. 04/09/23   [facility], Netty Starring., MD  ondansetron (ZOFRAN-ODT) 4 MG disintegrating tablet Take 1 tablet (4 mg total) by mouth every 6 (six) hours as needed for nausea or vomiting. 04/09/23   [facility], Netty Starring., MD  Potassium Chloride ER 20 MEQ TBCR Take 1 tablet by mouth 2 times daily Patient not  taking: Reported on 11/19/2022 08/27/21     PRESCRIPTION MEDICATION at bedtime. Bipap    [provider]  prochlorperazine (COMPAZINE) 25 MG suppository Place 1 suppository (25 mg total) rectally every 12 (twelve) hours as needed for nausea or vomiting. 04/09/23   [facility], Netty Starring., MD  prochlorperazine (COMPAZINE) 5 MG tablet Take 1 tablet (5 mg total) by mouth every 6 (six) hours as needed for nausea or vomiting. 04/09/23   [facility], Netty Starring., MD  propranolol (INDERAL) 20 MG tablet Take 1 tablet (20 mg total) by mouth every 8 (eight) hours. Patient taking differently: Take 20  mg by mouth daily. 09/26/22   Mallipeddi, Vishnu P, MD  Propylene Glycol (SYSTANE BALANCE OP) Place 2-3 drops into both eyes daily as needed (dry eyes).    [provider]  Pseudoeph-Doxylamine-DM-APAP (NYQUIL PO) Take 2 tablets by mouth at bedtime as needed (pain).    [provider]  sucralfate (CARAFATE) 1 g tablet Take 1 tablet (1 g total) by mouth 4 (four) times daily before meals and nightly 01/01/23     VITAMIN E PO Take 1 tablet by mouth daily.    [provider]      Allergies    Other    Review of Systems   Review of Systems  Physical Exam Updated Vital Signs BP (!) 152/94 (BP Location: Right Arm)   Pulse 97   Temp 98.4 F (36.9 C) (Oral)   Resp 18   Ht 6\' 2"  (1.88 m)   Wt 88 kg   SpO2 96%   BMI 24.91 kg/m   Physical Exam Vitals and nursing note reviewed.  Constitutional:      General: He is not in acute distress.    Appearance: He is well-developed.  HENT:     Head: Normocephalic and atraumatic.     Mouth/Throat:     Mouth: Mucous membranes are dry.  Eyes:     General:        Right eye: No discharge.        Left eye: No discharge.     Conjunctiva/sclera: Conjunctivae normal.  Cardiovascular:     Rate and Rhythm: Normal rate and regular rhythm.     Heart sounds: Normal heart sounds.  Pulmonary:     Effort: Pulmonary effort is normal.     Breath sounds: Normal breath sounds.  Abdominal:     Palpations: Abdomen is soft.     Tenderness: There is no abdominal tenderness. There is no guarding or rebound.  Musculoskeletal:     Cervical back: Normal range of motion and neck supple.  Skin:    General: Skin is warm and dry.  Neurological:     Mental Status: He is alert.     ED Results / Procedures / Treatments   Labs (all labs ordered are listed, but only abnormal results are displayed) Labs Reviewed  CBC WITH DIFFERENTIAL/PLATELET - Abnormal; Notable for the following components:      Result Value   Neutro Abs 7.9 (*)    All  other components within normal limits  COMPREHENSIVE METABOLIC PANEL - Abnormal; Notable for the following components:   Sodium 134 (*)    Chloride 95 (*)    Glucose, Bld 157 (*)    Creatinine, Ser 0.57 (*)    Total Bilirubin 1.8 (*)    All other components within normal limits    EKG None  Radiology DG Chest Port 1 View  Result Date: 04/09/2023 CLINICAL DATA:  Nausea  and vomiting. Status post EGD. Evaluate for bowel perforation. EXAM: PORTABLE CHEST 1 VIEW COMPARISON:  None Available. FINDINGS: The lungs are clear. There is no pleural effusion pneumothorax. The cardiac silhouette is within normal limits. No bowel dilatation or evidence of obstruction. No obvious free air. Evaluation however is limited due to body habitus. The osseous structures are intact. The soft tissues are unremarkable. IMPRESSION: 1. No active disease. 2. No obvious free air. Electronically Signed   By: Elgie Collard M.D.   On: 04/09/2023 18:20   DG Abd Portable 2V  Result Date: 04/09/2023 CLINICAL DATA:  Nausea and vomiting. Status post EGD. Evaluate for bowel perforation. EXAM: PORTABLE CHEST 1 VIEW COMPARISON:  None Available. FINDINGS: The lungs are clear. There is no pleural effusion pneumothorax. The cardiac silhouette is within normal limits. No bowel dilatation or evidence of obstruction. No obvious free air. Evaluation however is limited due to body habitus. The osseous structures are intact. The soft tissues are unremarkable. IMPRESSION: 1. No active disease. 2. No obvious free air. Electronically Signed   By: Elgie Collard M.D.   On: 04/09/2023 18:20    Procedures Procedures    Medications Ordered in ED Medications  sodium chloride 0.9 % bolus 1,000 mL (1,000 mLs Intravenous New Bag/Given 04/11/23 1053)  ondansetron (ZOFRAN) 8 mg in sodium chloride 0.9 % 50 mL IVPB (8 mg Intravenous New Bag/Given 04/11/23 1053)    ED Course/ Medical Decision Making/ A&P    Patient seen and examined. History  obtained directly from patient and wife.  Reviewed previous [facility] procedure notes.  Labs/EKG: Ordered CBC, CMP.  Imaging: None ordered  Medications/Fluids: Fluid bolus, Zofran  Most recent vital signs reviewed and are as follows: BP (!) 152/94 (BP Location: Right Arm)   Pulse 97   Temp 98.4 F (36.9 C) (Oral)   Resp 18   Ht 6\' 2"  (1.88 m)   Wt 88 kg   SpO2 96%   BMI 24.91 kg/m   Initial impression: Nausea and vomiting  11:28 AM Reassessment performed. Patient appears stable.  Labs personally reviewed and interpreted including: CBC unremarkable; CMP sodium minimally low at 134, chloride minimally low at 95, glucose 157, creatinine is actually low at 0.57 with a BUN of 8.  Plan: I have spoken with [place] PA-C of [facility], Dr. Rhea Belton is also on-call.  They are working to make arrangements to have the stent removed.  Unclear if this will be today or not.  They have asked for at least observation admission to the hospital for IV fluids and symptom control.  12:18 PM I spoke with Dr. Erenest Blank of [facility] who will see patient for admit.                                  Medical Decision Making Amount and/or Complexity of Data Reviewed Labs: ordered.  Risk Decision regarding hospitalization.   Patient with inability to tolerate recent stenting of the esophagogastric junction due to stenosis.  Not currently vomiting, but is having difficulty tolerating orals at home.  Labs at this point look okay.  [facility]ry plans to remove stent.  Patient may also need to be reevaluated for feeding tube placement.         Final Clinical Impression(s) / ED Diagnoses Final diagnoses:  Nausea and vomiting, unspecified vomiting type    Rx / DC Orders ED Discharge Orders     None  Renne Crigler, PA-C 04/11/23 1133    Renne Crigler, PA-C 04/11/23 1219    Maia Plan, MD 04/13/23 [phone]

## 2023-04-11 NOTE — Anesthesia Procedure Notes (Signed)
Procedure Name: Intubation Date/Time: 04/11/2023 2:27 PM  Performed by: Oletha Cruel, CRNAPre-anesthesia Checklist: Patient identified, Emergency Drugs available, Suction available and Patient being monitored Patient Re-evaluated:Patient Re-evaluated prior to induction Oxygen Delivery Method: Circle system utilized Preoxygenation: Pre-oxygenation with 100% oxygen Induction Type: IV induction Ventilation: Mask ventilation without difficulty Laryngoscope Size: Mac and 4 Grade View: Grade III Tube type: Oral Tube size: 7.5 mm Number of attempts: 1 Airway Equipment and Method: Stylet Placement Confirmation: ETT inserted through vocal cords under direct vision Secured at: 23 cm Tube secured with: Tape Dental Injury: Teeth and Oropharynx as per pre-operative assessment

## 2023-04-11 NOTE — Progress Notes (Signed)
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 Now unable to tolerate p.o. with intractable painful hiccups, regurgitation.  He has discussed several times with Dr. Meridee Score who has recommended to remove Axios.  Plan: -Remove Axios under GA with Dr Rhea Belton.   -Continue IV hydration -Reviewed endo notes, x-ray KUB, chest x-ray from 8/15  I have discussed procedure, risks and benefits.     SUBJECTIVE   Very painful hiccups-becomes "red" with pain Occurring every 30 minutes Painful regurgitation Unable to tolerate p.o.- Hardly had 10 oz of fluid intake since stent 8/15    OBJECTIVE:     Vital signs in last 24 hours: Temp:  [98.4 F (36.9 C)-98.6 F (37 C)] 98.6 F (37 C) (08/17 1224) Pulse Rate:  [80-97] 80 (08/17 1224) Resp:  [18-20] 20 (08/17 1224) BP: (152-157)/(85-94) 157/85 (08/17 1224) SpO2:  [96 %] 96 % (08/17 1224) Weight:  [88 kg] 88 kg (08/17 0906)   General:   Alert, well-developed male.  Gets quite miserable especially when he starts hiccuping and burping. EENT:  Normal hearing, non icteric sclera, conjunctive pink.  Heart:  Regular rate and rhythm; no murmur.  No lower extremity edema   Pulm: Normal respiratory effort, lungs CTA bilaterally without wheezes or crackles. Abdomen:  Soft, nondistended, nontender.  Normal bowel sounds,.       Neurologic:  Alert and  oriented x4;  grossly normal neurologically. Psych:  Pleasant, cooperative.    Intake/Output from previous day: No intake/output data recorded. Intake/Output this shift: No intake/output data recorded.  Lab Results: Recent Labs    04/11/23 1054  WBC 9.5  HGB 16.4  HCT 49.1  PLT 219   BMET Recent Labs    04/11/23 1054  NA 134*  K 3.6  CL 95*  CO2 27  GLUCOSE 157*  BUN 8  CREATININE 0.57*  CALCIUM 8.9   LFT Recent Labs    04/11/23 1054  PROT 6.9  ALBUMIN 3.7  AST 19  ALT 19   ALKPHOS 104  BILITOT 1.8*   PT/INR No results for input(s): "LABPROT", "INR" in the last 72 hours. Hepatitis Panel No results for input(s): "HEPBSAG", "HCVAB", "HEPAIGM", "HEPBIGM" in the last 72 hours.  DG Chest Port 1 View  Result Date: 04/09/2023 CLINICAL DATA:  Nausea and vomiting. Status post EGD. Evaluate for bowel perforation. EXAM: PORTABLE CHEST 1 VIEW COMPARISON:  None Available. FINDINGS: The lungs are clear. There is no pleural effusion pneumothorax. The cardiac silhouette is within normal limits. No bowel dilatation or evidence of obstruction. No obvious free air. Evaluation however is limited due to body habitus. The osseous structures are intact. The soft tissues are unremarkable. IMPRESSION: 1. No active disease. 2. No obvious free air. Electronically Signed   By: Elgie Collard M.D.   On: 04/09/2023 18:20   DG Abd Portable 2V  Result Date: 04/09/2023 CLINICAL DATA:  Nausea and vomiting. Status post EGD. Evaluate for bowel perforation. EXAM: PORTABLE CHEST 1 VIEW COMPARISON:  None Available. FINDINGS: The lungs are clear. There is no pleural effusion pneumothorax. The cardiac silhouette is within normal limits. No bowel dilatation or evidence of obstruction. No obvious free air. Evaluation however is limited due to body habitus. The osseous structures are intact. The soft tissues are unremarkable. IMPRESSION: 1. No active disease. 2. No obvious free air. Electronically Signed   By: Elgie Collard M.D.   On: 04/09/2023 18:20  Principal Problem:   Esophageal stricture     LOS: 0 days     [facility] Circle, MD 04/11/2023, 12:27 PM [facility] [phone]

## 2023-04-11 NOTE — Anesthesia Preprocedure Evaluation (Addendum)
Anesthesia Evaluation  Patient identified by MRN, date of birth, ID band Patient awake    Reviewed: Allergy & Precautions, NPO status , Patient's Chart, lab work & pertinent test results, reviewed documented beta blocker date and time   History of Anesthesia Complications Negative for: history of anesthetic complications  Airway Mallampati: III  TM Distance: >3 FB Neck ROM: Full    Dental no notable dental hx. (+) Dental Advisory Given, Teeth Intact   Pulmonary sleep apnea and Continuous Positive Airway Pressure Ventilation , former smoker   Pulmonary exam normal breath sounds clear to auscultation       Cardiovascular hypertension, Pt. on medications and Pt. on home beta blockers (-) angina + CAD, + Past MI and + Cardiac Stents  Normal cardiovascular exam+ dysrhythmias Supra Ventricular Tachycardia  Rhythm:Regular Rate:Normal  '22 CATH: LM nl, LAD 80p (3.0x18 Onyx Frontier DES), D1 90 (too small for PCI), LCX nl, RCA nl. '22 ECHO: EF 50-55%, apical HK, mod asymm basal-septal hypertrophy. Nl RV fxn.  Hx/o NSTEMI post op lap gastric banding   Neuro/Psych negative neurological ROS  negative psych ROS   GI/Hepatic Neg liver ROS,GERD  Medicated and Controlled,,S/p lap band Dysphagia- can only swallow liquids Hx/o esophageal stricture with multiple dilations in the past   Endo/Other  diabetes, Well Controlled, Type 2, Insulin Dependent    Renal/GU Renal diseaseHx/o renal calculi  negative genitourinary   Musculoskeletal  (+) Arthritis ,    Abdominal   Peds  Hematology  (+) Blood dyscrasia (Plavix), anemia Plavix therapy- last dose more than a month ago   Anesthesia Other Findings dysphagia- stricture  Reproductive/Obstetrics negative OB ROS                             Anesthesia Physical Anesthesia Plan  ASA: 3  Anesthesia Plan: MAC and General   Post-op Pain Management: Minimal or no  pain anticipated   Induction: Intravenous and Rapid sequence  PONV Risk Score and Plan: 3 and Treatment may vary due to age or medical condition, Ondansetron and Dexamethasone  Airway Management Planned: Natural Airway and Simple Face Mask  Additional Equipment: None  Intra-op Plan:   Post-operative Plan: Extubation in OR  Informed Consent: I have reviewed the patients History and Physical, chart, labs and discussed the procedure including the risks, benefits and alternatives for the proposed anesthesia with the patient or authorized representative who has indicated his/her understanding and acceptance.     Dental advisory given  Plan Discussed with: CRNA and Anesthesiologist  Anesthesia Plan Comments: (Actively vomiting bile, will RSI w/ ETT Glidescope available, has been used in the past )       Anesthesia Quick Evaluation

## 2023-04-11 NOTE — ED Notes (Signed)
ED TO INPATIENT HANDOFF REPORT  ED Nurse Name and Phone #: Rebecca Eaton RN  S Name/Age/Gender Hector Curry 62 y.o. male Room/Bed: WA25/WA25  Code Status   Code Status: Full Code  Home/SNF/Other Home Patient oriented to: self, place, time, and situation Is this baseline? Yes   Triage Complete: Triage complete  Chief Complaint Esophageal stricture [K22.2]  Triage Note Pt arrived via POV, recently had axios stent for esophagogastric anastomosis. Has not been able to tolerate POs due to n/v and pain. Has spoken with GI multiple times over the past couple days and told to come to ED if sx worsening.    Allergies Allergies  Allergen Reactions   Other Itching and Other (See Comments)    Cats Wheezing, runny nose/eyes, congestion     Level of Care/Admitting Diagnosis ED Disposition     ED Disposition  Admit   Condition  --   Comment  Hospital Area: [facility] [[id number]]  Level of Care: Med-Surg [16]  May place patient in observation at [facility] or [facility] if equivalent level of care is available:: Yes  Covid Evaluation: Asymptomatic - no recent exposure (last 10 days) testing not required  Diagnosis: Esophageal stricture [562130]  Admitting Physician: Maryln Gottron [[id number]]  Attending Physician: Kirby Crigler, MIR M [[id number]]          B Medical/Surgery History Past Medical History:  Diagnosis Date   Allergic rhinitis    Atrial tachycardia    CAD (coronary artery disease)    a. 05/08/2015 Cath: no significant CAD, LVEF nl-->Med; b. 07/2017 MV: attenuation artifact, no ischemia, EF 65%-->Low risk; c. 03/2021 NSTEMI/PCI: LM nl, LAD 80p (3.0x18 Onyx Frontier DES), D1 90 (too small for PCI), LCX nl, RCA nl.   Diabetes mellitus without complication (HCC)    Diastolic dysfunction    a. 04/2015 Echo: EF 60-65%; b. 05/2016 Echo: EF 60-65%, GrI DD; c. 07/2017 Echo: EF 55-60%, Ao root 42mm; d. 03/2019 Echo: EF 55-60%, Ao root/Asc Ao 42mm; e.  03/2021 Echo: EF 50-55%, apical HK, mod asymm basal-septal hypertrophy. Nl RV fxn. Asc Ao 44mm.   Dilated aortic root (HCC)    a. 07/2017 Echo: 42mm Ao root - mildly dil; b. 03/2019 Echo: Ao root 42mm; c. 03/2021 Echo: Asc Ao 44mm.   Diverticulosis 10/03/2014   Dysrhythmia    Esophageal stricture    a.  In setting of lap band June 2022-stricture at the GJ anastomosis requiring gastrostomy tube; b. 05/2021 s/p esoph stenting (15mm); c. 07/2021 s/p esoph stenting (20mm).   Family history of premature CAD    a. father passed from MI at 74   GERD (gastroesophageal reflux disease)    GIB (gastrointestinal bleeding)    a. 07/2021 following esoph stenting.   Hemorrhoids    a. internal hemorrhoids s/p surgery 1999   History of kidney stones    History of tobacco abuse    Hyperlipidemia    Hyperplastic colon polyp 10/03/2014   a. x 2    Hypertension    Inflammatory arthritis    a. CCP antibodies & x-rays negative. Rheumatoid factor 14, felt to be crystaline over RA or psoriatic   Iron deficiency anemia 02/05/2022   Morbid obesity (HCC)    a. s/p LAP-BAND - complicated by esoph stricture.   Myocardial infarction (HCC)    NSTEMI   OSA (obstructive sleep apnea)    a. on CPAP   Osteoarthritis    PSVT (paroxysmal supraventricular tachycardia)    a.  48 hr Holter 04/2015: NSR w/ rare PVC, short runs of narrow complex tachycardiac, possible atrial tach, longest run 7 beats, PACs noted (2% of all beats 3600 total) they did not seem to correlate w/ significant arrythmia; b. 08/2017 Event monitor: no significant arrhythmias.   Past Surgical History:  Procedure Laterality Date   BALLOON DILATION N/A 08/14/2021   Procedure: BALLOON DILATION;  Surgeon: Mansouraty, Netty Starring., MD;  Location: [facility]COPY;  Service: Gastroenterology;  Laterality: N/A;   BALLOON DILATION N/A 02/20/2022   Procedure: BALLOON DILATION;  Surgeon: Rachael Fee, MD;  Location: [facility]COPY;  Service: Gastroenterology;   Laterality: N/A;   BALLOON DILATION N/A 03/27/2022   Procedure: BALLOON DILATION;  Surgeon: Meridee Score Netty Starring., MD;  Location[facility] ENDOSCOPY;  Service: Gastroenterology;  Laterality: N/A;   BARIATRIC SURGERY  01/2021   lap band    BILIARY STENT PLACEMENT N/A 08/14/2021   Procedure: AXIOS STENT PLACEMENT;  Surgeon: Lemar Lofty., MD;  Location: [facility] ENDOSCOPY;  Service: Gastroenterology;  Laterality: N/A;   BILIARY STENT PLACEMENT N/A 10/16/2022   Procedure: STENT PLACEMENT;  Surgeon: Lemar Lofty., MD;  Location: [facility]COPY;  Service: Gastroenterology;  Laterality: N/A;   BIOPSY  07/08/2021   Procedure: BIOPSY;  Surgeon: Meridee Score Netty Starring., MD;  Location: [facility] ENDOSCOPY;  Service: Gastroenterology;;   BIOPSY  08/14/2021   Procedure: BIOPSY;  Surgeon: Lemar Lofty., MD;  Location: [facility] ENDOSCOPY;  Service: Gastroenterology;;   BIOPSY  10/16/2022   Procedure: BIOPSY;  Surgeon: Lemar Lofty., MD;  Location: [facility] ENDOSCOPY;  Service: Gastroenterology;;   CARDIAC CATHETERIZATION N/A 05/08/2015   Procedure: Left Heart Cath and Coronary Angiography;  Surgeon: Corky Crafts, MD;  Location: [facility] INVASIVE CV LAB;  Service: Cardiovascular;  Laterality: N/A;   CARPAL TUNNEL RELEASE Bilateral    CHOLECYSTECTOMY     COLONOSCOPY  06/27/2005   COLONOSCOPY  10/03/2014   CORONARY STENT INTERVENTION N/A 04/15/2021   Procedure: CORONARY STENT INTERVENTION;  Surgeon: Runell Gess, MD;  Location: [facility] INVASIVE CV LAB;  Service: Cardiovascular;  Laterality: N/A;   DUODENAL STENT PLACEMENT N/A 11/18/2021   Procedure: DUODENAL STENT PLACEMENT;  Surgeon: Meridee Score Netty Starring., MD;  Location: [facility] ENDOSCOPY;  Service: Gastroenterology;  Laterality: N/A;  axios placed at GJ anastamosis   DUODENAL STENT PLACEMENT  03/27/2022   Procedure: GASTRIC STENT PLACEMENT;  Surgeon: Meridee Score Netty Starring., MD;  Location: [facility] ENDOSCOPY;  Service: Gastroenterology;;   ESOPHAGEAL  DILATION  05/27/2021   Procedure: ESOPHAGEAL DILATION;  Surgeon: Lemar Lofty., MD;  Location: [facility];  Service: Gastroenterology;;   ESOPHAGEAL DILATION  01/23/2022   Procedure: ESOPHAGEAL DILATION;  Surgeon: Lemar Lofty., MD;  Location: [facility] ENDOSCOPY;  Service: Gastroenterology;;   ESOPHAGEAL STENT PLACEMENT N/A 05/27/2021   Procedure: ESOPHAGEAL STENT PLACEMENT;  Surgeon: Lemar Lofty., MD;  Location: [facility] ENDOSCOPY;  Service: Gastroenterology;  Laterality: N/A;   ESOPHAGOGASTRODUODENOSCOPY  06/27/2005   ESOPHAGOGASTRODUODENOSCOPY N/A 03/19/2021   Procedure: ESOPHAGOGASTRODUODENOSCOPY (EGD);  Surgeon: Sheliah Hatch, De Blanch, MD;  Location: [facility] ENDOSCOPY;  Service: General;  Laterality: N/A;   ESOPHAGOGASTRODUODENOSCOPY N/A 08/16/2021   Procedure: ESOPHAGOGASTRODUODENOSCOPY (EGD);  Surgeon: Lemar Lofty., MD;  Location: [facility] ENDOSCOPY;  Service: Gastroenterology;  Laterality: N/A;   ESOPHAGOGASTRODUODENOSCOPY (EGD) WITH PROPOFOL N/A 05/27/2021   Procedure: ESOPHAGOGASTRODUODENOSCOPY (EGD) WITH PROPOFOL;  Surgeon: Meridee Score Netty Starring., MD;  Location: [facility] ENDOSCOPY;  Service: Gastroenterology;  Laterality: N/A;   ESOPHAGOGASTRODUODENOSCOPY (EGD) WITH PROPOFOL N/A 07/08/2021   Procedure: ESOPHAGOGASTRODUODENOSCOPY (EGD) WITH PROPOFOL;  Surgeon:  Mansouraty, Netty Starring., MD;  Location: [facility] ENDOSCOPY;  Service: Gastroenterology;  Laterality: N/A;  fluoro   ESOPHAGOGASTRODUODENOSCOPY (EGD) WITH PROPOFOL N/A 08/14/2021   Procedure: ESOPHAGOGASTRODUODENOSCOPY (EGD) WITH PROPOFOL;  Surgeon: Meridee Score Netty Starring., MD;  Location: [facility] ENDOSCOPY;  Service: Gastroenterology;  Laterality: N/A;  fluoro Axios stent (20 mm)   ESOPHAGOGASTRODUODENOSCOPY (EGD) WITH PROPOFOL N/A 10/16/2021   Procedure: ESOPHAGOGASTRODUODENOSCOPY (EGD) WITH PROPOFOL;  Surgeon: Meridee Score Netty Starring., MD;  Location: [facility] ENDOSCOPY;  Service: Gastroenterology;  Laterality: N/A;  axios stent pull  fluoro   ESOPHAGOGASTRODUODENOSCOPY (EGD) WITH PROPOFOL N/A 11/18/2021   Procedure: ESOPHAGOGASTRODUODENOSCOPY (EGD) WITH PROPOFOL;  Surgeon: Meridee Score Netty Starring., MD;  Location: [facility] ENDOSCOPY;  Service: Gastroenterology;  Laterality: N/A;   ESOPHAGOGASTRODUODENOSCOPY (EGD) WITH PROPOFOL N/A 01/23/2022   Procedure: ESOPHAGOGASTRODUODENOSCOPY (EGD) WITH PROPOFOL;  Surgeon: Meridee Score Netty Starring., MD;  Location: [facility] ENDOSCOPY;  Service: Gastroenterology;  Laterality: N/A;   ESOPHAGOGASTRODUODENOSCOPY (EGD) WITH PROPOFOL N/A 02/20/2022   Procedure: ESOPHAGOGASTRODUODENOSCOPY (EGD) WITH PROPOFOL;  Surgeon: Rachael Fee, MD;  Location: [facility] ENDOSCOPY;  Service: Gastroenterology;  Laterality: N/A;   ESOPHAGOGASTRODUODENOSCOPY (EGD) WITH PROPOFOL N/A 03/27/2022   Procedure: ESOPHAGOGASTRODUODENOSCOPY (EGD) WITH PROPOFOL;  Surgeon: Meridee Score Netty Starring., MD;  Location: [facility] ENDOSCOPY;  Service: Gastroenterology;  Laterality: N/A;   ESOPHAGOGASTRODUODENOSCOPY (EGD) WITH PROPOFOL N/A 08/14/2022   Procedure: ESOPHAGOGASTRODUODENOSCOPY (EGD) WITH PROPOFOL;  Surgeon: Meridee Score Netty Starring., MD;  Location: [facility] ENDOSCOPY;  Service: Gastroenterology;  Laterality: N/A;   ESOPHAGOGASTRODUODENOSCOPY (EGD) WITH PROPOFOL N/A 10/16/2022   Procedure: ESOPHAGOGASTRODUODENOSCOPY (EGD) WITH PROPOFOL;  Surgeon: Meridee Score Netty Starring., MD;  Location: [facility] ENDOSCOPY;  Service: Gastroenterology;  Laterality: N/A;   ESOPHAGOGASTRODUODENOSCOPY (EGD) WITH PROPOFOL N/A 11/28/2022   Procedure: ESOPHAGOGASTRODUODENOSCOPY (EGD) WITH PROPOFOL WITH AXIOS STENT REMOVAL;  Surgeon: Meridee Score Netty Starring., MD;  Location: [facility] ENDOSCOPY;  Service: Gastroenterology;  Laterality: N/A;   FLEXIBLE SIGMOIDOSCOPY N/A 08/16/2021   Procedure: FLEXIBLE SIGMOIDOSCOPY;  Surgeon: Meridee Score Netty Starring., MD;  Location: [facility] ENDOSCOPY;  Service: Gastroenterology;  Laterality: N/A;   FOREIGN BODY REMOVAL  02/20/2022   Procedure: FOREIGN BODY REMOVAL;   Surgeon: Rachael Fee, MD;  Location: [facility] ENDOSCOPY;  Service: Gastroenterology;;   FOREIGN BODY REMOVAL ESOPHAGEAL  03/27/2022   Procedure: REMOVAL FOREIGN BODY;  Surgeon: Meridee Score Netty Starring., MD;  Location: [facility] ENDOSCOPY;  Service: Gastroenterology;;   GASTRIC ROUX-EN-Y N/A 02/18/2021   Procedure: LAPAROSCOPIC ROUX-EN-Y GASTRIC BYPASS WITH UPPER ENDOSCOPY,;  Surgeon: Sheliah Hatch De Blanch, MD;  Location: [facility] ORS;  Service: General;  Laterality: N/A;   GASTRIC ROUX-EN-Y N/A 12/02/2022   Procedure: LAPAROSCOPIC converted to OPEN GASTRIC BYPASS REVERSAL, TAKEDOWN OF GASTROJEJUNAL ANASTOMOSIS, GASTRO GASTRIC ANASTOMOSIS WITH UPPER ENDOSCOPY 1. Laparoscopic lysis of adhesions 2. Partial gastrectomy 3. Small bowel resection 4. Creation of esophagus to gastric anastomosis 5. Creation of gastrostomy;  Surgeon: Kinsinger, De Blanch, MD;  Location: [facility] ORS;  Service: General;  Laterality:    GASTROINTESTINAL STENT REMOVAL  01/23/2022   Procedure: AXIOS STENT REMOVAL;  Surgeon: Lemar Lofty., MD;  Location: [facility] ENDOSCOPY;  Service: Gastroenterology;;   GASTROSTOMY N/A 12/02/2022   Procedure: INSERTION OF GASTROSTOMY TUBE;  Surgeon: Rodman Pickle, MD;  Location: [facility] ORS;  Service: General;  Laterality: N/A;   HEMORRHOID SURGERY  08/25/1997   IR GJ TUBE CHANGE  03/27/2021   LAPAROSCOPIC INSERTION GASTROSTOMY TUBE N/A 03/20/2021   Procedure: LAPAROSCOPIC INSERTION GASTROSTOMY TUBE;  Surgeon: Rodman Pickle, MD;  Location: [facility] ORS;  Service: General;  Laterality: N/A;   LEFT HEART CATH AND CORONARY ANGIOGRAPHY N/A 04/15/2021  Procedure: LEFT HEART CATH AND CORONARY ANGIOGRAPHY;  Surgeon: Runell Gess, MD;  Location: [facility] INVASIVE CV LAB;  Service: Cardiovascular;  Laterality: N/A;   MOUTH SURGERY     removed area which was benign   STENT REMOVAL  07/08/2021   Procedure: STENT REMOVAL;  Surgeon: Lemar Lofty., MD;  Location: [facility] ENDOSCOPY;  Service: Gastroenterology;;    Francine Graven REMOVAL  10/16/2021   Procedure: STENT REMOVAL;  Surgeon: Lemar Lofty., MD;  Location: [facility] ENDOSCOPY;  Service: Gastroenterology;;   Francine Graven REMOVAL  08/14/2022   Procedure: STENT REMOVAL;  Surgeon: Lemar Lofty., MD;  Location: [facility] ENDOSCOPY;  Service: Gastroenterology;;   Francine Graven REMOVAL  11/28/2022   Procedure: STENT REMOVAL;  Surgeon: Lemar Lofty., MD;  Location: [facility] ENDOSCOPY;  Service: Gastroenterology;;   STERIOD INJECTION  01/23/2022   Procedure: STEROID INJECTION;  Surgeon: Lemar Lofty., MD;  Location: [facility] ENDOSCOPY;  Service: Gastroenterology;;   SUBMUCOSAL INJECTION  11/18/2021   Procedure: SUBMUCOSAL INJECTION;  Surgeon: Lemar Lofty., MD;  Location: [facility] ENDOSCOPY;  Service: Gastroenterology;;   SUBMUCOSAL INJECTION  08/14/2022   Procedure: SUBMUCOSAL INJECTION;  Surgeon: Lemar Lofty., MD;  Location: [facility] ENDOSCOPY;  Service: Gastroenterology;;   TONSILLECTOMY     UPPER GI ENDOSCOPY N/A 03/20/2021   Procedure: UPPER GI ENDOSCOPY;  Surgeon: Kinsinger, De Blanch, MD;  Location: [facility] ORS;  Service: General;  Laterality: N/A;     A IV Location/Drains/Wounds Patient Lines/Drains/Airways Status     Active Line/Drains/Airways     Name Placement date Placement time Site Days   Peripheral IV 04/11/23 20 G 1.25" Anterior;Right Forearm 04/11/23  1053  Forearm  less than 1   Closed System Drain 1 Right Abdomen 19 Fr. 12/02/22  1413  Abdomen  130   Gastrostomy/Enterostomy Gastrostomy 18 Fr. 12/02/22  1405  --  130   GI Stent 10/16/22  0941  --  177   GI Stent 04/09/23  1555  --  2   Incision - 5 Ports Abdomen 1: Right;Lateral;Lower 2: Left;Upper 3: Left;Mid;Lateral 4: Lateral;Lower;Left 5: Left;Medial;Mid 12/02/22  --  -- 130            Intake/Output Last 24 hours No intake or output data in the 24 hours ending 04/11/23 1238  Labs/Imaging Results for orders placed or performed during the hospital encounter of 04/11/23  (from the past 48 hour(s))  CBC with Differential     Status: Abnormal   Collection Time: 04/11/23 10:54 AM  Result Value Ref Range   WBC 9.5 4.0 - 10.5 K/uL   RBC 5.74 4.22 - 5.81 MIL/uL   Hemoglobin 16.4 13.0 - 17.0 g/dL   HCT 16.1 09.6 - 04.5 %   MCV 85.5 80.0 - 100.0 fL   MCH 28.6 26.0 - 34.0 pg   MCHC 33.4 30.0 - 36.0 g/dL   RDW 40.9 81.1 - 91.4 %   Platelets 219 150 - 400 K/uL   nRBC 0.0 0.0 - 0.2 %   Neutrophils Relative % 83 %   Neutro Abs 7.9 (H) 1.7 - 7.7 K/uL   Lymphocytes Relative 10 %   Lymphs Abs 1.0 0.7 - 4.0 K/uL   Monocytes Relative 7 %   Monocytes Absolute 0.7 0.1 - 1.0 K/uL   Eosinophils Relative 0 %   Eosinophils Absolute 0.0 0.0 - 0.5 K/uL   Basophils Relative 0 %   Basophils Absolute 0.0 0.0 - 0.1 K/uL   Immature Granulocytes 0 %   Abs Immature Granulocytes 0.03  0.00 - 0.07 K/uL    Comment: Performed at [facility], [address], [place], [place] [address]  Comprehensive metabolic panel     Status: Abnormal   Collection Time: 04/11/23 10:54 AM  Result Value Ref Range   Sodium 134 (L) 135 - 145 mmol/L   Potassium 3.6 3.5 - 5.1 mmol/L   Chloride 95 (L) 98 - 111 mmol/L   CO2 27 22 - 32 mmol/L   Glucose, Bld 157 (H) 70 - 99 mg/dL    Comment: Glucose reference range applies only to samples taken after fasting for at least 8 hours.   BUN 8 6 - 20 mg/dL   Creatinine, Ser 3.87 (L) 0.61 - 1.24 mg/dL   Calcium 8.9 8.9 - 56.4 mg/dL   Total Protein 6.9 6.5 - 8.1 g/dL   Albumin 3.7 3.5 - 5.0 g/dL   AST 19 15 - 41 U/L   ALT 19 0 - 44 U/L   Alkaline Phosphatase 104 38 - 126 U/L   Total Bilirubin 1.8 (H) 0.3 - 1.2 mg/dL   GFR, Estimated >33 >29 mL/min    Comment: (NOTE) Calculated using the CKD-EPI Creatinine Equation (2021)    Anion gap 12 5 - 15    Comment: Performed at [facility], [address] [place], [place] [address]   DG Chest Port 1 View  Result Date: 04/09/2023 CLINICAL DATA:  Nausea and vomiting.  Status post EGD. Evaluate for bowel perforation. EXAM: PORTABLE CHEST 1 VIEW COMPARISON:  None Available. FINDINGS: The lungs are clear. There is no pleural effusion pneumothorax. The cardiac silhouette is within normal limits. No bowel dilatation or evidence of obstruction. No obvious free air. Evaluation however is limited due to body habitus. The osseous structures are intact. The soft tissues are unremarkable. IMPRESSION: 1. No active disease. 2. No obvious free air. Electronically Signed   By: Elgie Collard M.D.   On: 04/09/2023 18:20   DG Abd Portable 2V  Result Date: 04/09/2023 CLINICAL DATA:  Nausea and vomiting. Status post EGD. Evaluate for bowel perforation. EXAM: PORTABLE CHEST 1 VIEW COMPARISON:  None Available. FINDINGS: The lungs are clear. There is no pleural effusion pneumothorax. The cardiac silhouette is within normal limits. No bowel dilatation or evidence of obstruction. No obvious free air. Evaluation however is limited due to body habitus. The osseous structures are intact. The soft tissues are unremarkable. IMPRESSION: 1. No active disease. 2. No obvious free air. Electronically Signed   By: Elgie Collard M.D.   On: 04/09/2023 18:20    Pending Labs Unresulted Labs (From admission, onward)     Start     Ordered   04/12/23 0500  Basic metabolic panel  Tomorrow morning,   R        04/11/23 1223   04/12/23 0500  CBC  Tomorrow morning,   R        04/11/23 1223   04/11/23 1223  HIV Antibody (routine testing w rflx)  (HIV Antibody (Routine testing w reflex) panel)  Once,   R        04/11/23 1223            Vitals/Pain Today's Vitals   04/11/23 0906 04/11/23 1224  BP: (!) 152/94 (!) 157/85  Pulse: 97 80  Resp: 18 20  Temp: 98.4 F (36.9 C) 98.6 F (37 C)  TempSrc: Oral Oral  SpO2: 96% 96%  Weight: 88 kg   Height: 6\' 2"  (1.88 m)   PainSc:  0-No pain    Isolation Precautions No active isolations  Medications Medications  clopidogrel (PLAVIX) tablet 75  mg (has no administration in time range)  fluticasone (FLONASE) 50 MCG/ACT nasal spray 2-3 spray (has no administration in time range)  enoxaparin (LOVENOX) injection 40 mg (has no administration in time range)  0.9 %  sodium chloride infusion (has no administration in time range)  acetaminophen (TYLENOL) tablet 650 mg (has no administration in time range)    Or  acetaminophen (TYLENOL) suppository 650 mg (has no administration in time range)  morphine (PF) 2 MG/ML injection 1 mg (has no administration in time range)  ondansetron (ZOFRAN) tablet 4 mg (has no administration in time range)    Or  ondansetron (ZOFRAN) injection 4 mg (has no administration in time range)  albuterol (PROVENTIL) (2.5 MG/3ML) 0.083% nebulizer solution 2.5 mg (has no administration in time range)  pantoprazole (PROTONIX) injection 40 mg (has no administration in time range)  sodium chloride 0.9 % bolus 1,000 mL (0 mLs Intravenous Stopped 04/11/23 1220)  ondansetron (ZOFRAN) 8 mg in sodium chloride 0.9 % 50 mL IVPB (0 mg Intravenous Stopped 04/11/23 1134)    Mobility walks     Focused Assessments     R Recommendations: See Admitting Provider Note  Report given to:   Additional Notes:

## 2023-04-11 NOTE — Op Note (Signed)
[facility] Patient Name: Hector Curry Procedure Date: 04/11/2023 MRN: [id number] Attending MD: Beverley Fiedler , MD, [id number] Date of Birth: 1960/12/24 CSN: [id number] Age: 62 Admit Type: Inpatient Procedure:                Upper GI endoscopy Indications:              Nausea with vomiting, known post-bariatric GE                            junction stricture with Axios stent placement 48                            hours ago, unable to PO post-stenting due to                            nausea, retching and hiccups Providers:                Carie Caddy. Rhea Belton, MD, Eliberto Ivory, RN, Sunday Corn                            [place], Technician Referring MD:              Medicines:                General Anesthesia Complications:            No immediate complications. Estimated Blood Loss:     Estimated blood loss was minimal. Procedure:                Pre-Anesthesia Assessment:                           - Prior to the procedure, a History and Physical                            was performed, and patient medications and                            allergies were reviewed. The patient's tolerance of                            previous anesthesia was also reviewed. The risks                            and benefits of the procedure and the sedation                            options and risks were discussed with the patient.                            All questions were answered, and informed consent                            was obtained. Prior Anticoagulants: The patient has  taken no anticoagulant or antiplatelet agents. ASA                            Grade Assessment: III - A patient with severe                            systemic disease. After reviewing the risks and                            benefits, the patient was deemed in satisfactory                            condition to undergo the procedure.                           After obtaining  informed consent, the endoscope was                            passed under direct vision. Throughout the                            procedure, the patient's blood pressure, pulse, and                            oxygen saturations were monitored continuously. The                            [id number] ([id number]) Olympus therapeutic endoscope                            was introduced through the mouth, and advanced to                            the second part of duodenum. The upper GI endoscopy                            was accomplished without difficulty. The patient                            tolerated the procedure well. Scope In: Scope Out: Findings:      Fluid was found in the entire esophagus. Fluid aspiration was performed.      An esophageal stent was found at the gastroesophageal junction. Stent       was easily traversable with the therapeutic upper endoscope. Stent       removal was accomplished with a Raptor grasping device.      Expected moderate esophagitis was found at the gastroesophageal       junction. Minor self-limited oozing.      The entire examined stomach was normal.      The examined duodenum was normal. Impression:               - Fluid in the esophagus and stomach. Fluid was  removed with scope suction.                           - Pre-existing esophageal stent, removed.                           - Expected trauma/esophagitis from recent stenting                            at the GE junction. The GE junction post-stent                            removal was patent (scope passes with no                            resistance).                           - Normal stomach.                           - Normal examined duodenum. Moderate Sedation:      N/A Recommendation:           - Return patient to hospital ward for possible                            discharge same day. Okay for discharge when able to                            PO and  symptoms controlled/resolved.                           - Advance diet as tolerated.                           - Continue present medications. BID PPI. Carafate                            slurry TID-AC and HS as needed.                           - Anti-emetics as needed.                           - Followup with be arranged with Dr. Meridee Score. Procedure Code(s):        --- Professional ---                           [phone], Esophagogastroduodenoscopy, flexible,                            transoral; with removal of foreign body(s) Diagnosis Code(s):        --- Professional ---                           Z97.8, Presence of other specified devices  K20.90, Esophagitis, unspecified without bleeding                           R11.2, Nausea with vomiting, unspecified CPT copyright 2022 [employer]. All rights reserved. The codes documented in this report are preliminary and upon coder review may  be revised to meet current compliance requirements. Beverley Fiedler, MD 04/11/2023 2:50:15 PM This report has been signed electronically. Number of Addenda: 0

## 2023-04-11 NOTE — H&P (Signed)
History and Physical  Hector Curry JXB:[id number] DOB: [birth year] DOA: 04/11/2023  PCP: Marguarite Arbour, MD   Chief Complaint: Vomiting  HPI: This is a 39 old gentleman with a history of CAD, GERD, hypertension and post bariatric surgery anastomotic strictures being admitted to the hospital for observation after recent gastroesophageal stent placement.  He has had multiple balloon dilations and stenting to his gastroduodenal anastomosis in the past.  He had gastroesophageal stent placed by Dr. Meridee Score on 8/15, with difficult to control nausea thereafter.  He was offered observation admission, but elected to go home.  Concern is that he has continued peristalsis of the gastro esophageal junction.  ED Course: Labs and vital signs are unremarkable, patient denies any fevers, chills, bleeding, or other complication.  Provider discussed with gastroenterology on-call, who recommends keeping the patient n.p.o., and he may require EGD with stent removal.  They will see in consultation.  Review of Systems: Please see HPI for pertinent positives and negatives. A complete 10 system review of systems are otherwise negative.  Past Medical History:  Diagnosis Date   Allergic rhinitis    Atrial tachycardia    CAD (coronary artery disease)    a. 05/08/2015 Cath: no significant CAD, LVEF nl-->Med; b. 07/2017 MV: attenuation artifact, no ischemia, EF 65%-->Low risk; c. 03/2021 NSTEMI/PCI: LM nl, LAD 80p (3.0x18 Onyx Frontier DES), D1 90 (too small for PCI), LCX nl, RCA nl.   Diabetes mellitus without complication (HCC)    Diastolic dysfunction    a. 04/2015 Echo: EF 60-65%; b. 05/2016 Echo: EF 60-65%, GrI DD; c. 07/2017 Echo: EF 55-60%, Ao root 42mm; d. 03/2019 Echo: EF 55-60%, Ao root/Asc Ao 42mm; e. 03/2021 Echo: EF 50-55%, apical HK, mod asymm basal-septal hypertrophy. Nl RV fxn. Asc Ao 44mm.   Dilated aortic root (HCC)    a. 07/2017 Echo: 42mm Ao root - mildly dil; b. 03/2019 Echo: Ao root 42mm; c.  03/2021 Echo: Asc Ao 44mm.   Diverticulosis 10/03/2014   Dysrhythmia    Esophageal stricture    a.  In setting of lap band June 2022-stricture at the GJ anastomosis requiring gastrostomy tube; b. 05/2021 s/p esoph stenting (15mm); c. 07/2021 s/p esoph stenting (20mm).   Family history of premature CAD    a. father passed from MI at 58   GERD (gastroesophageal reflux disease)    GIB (gastrointestinal bleeding)    a. 07/2021 following esoph stenting.   Hemorrhoids    a. internal hemorrhoids s/p surgery 1999   History of kidney stones    History of tobacco abuse    Hyperlipidemia    Hyperplastic colon polyp 10/03/2014   a. x 2    Hypertension    Inflammatory arthritis    a. CCP antibodies & x-rays negative. Rheumatoid factor 14, felt to be crystaline over RA or psoriatic   Iron deficiency anemia 02/05/2022   Morbid obesity (HCC)    a. s/p LAP-BAND - complicated by esoph stricture.   Myocardial infarction [facility])    NSTEMI   OSA (obstructive sleep apnea)    a. on CPAP   Osteoarthritis    PSVT (paroxysmal supraventricular tachycardia)    a. 48 hr Holter 04/2015: NSR w/ rare PVC, short runs of narrow complex tachycardiac, possible atrial tach, longest run 7 beats, PACs noted (2% of all beats 3600 total) they did not seem to correlate w/ significant arrythmia; b. 08/2017 Event monitor: no significant arrhythmias.   Past Surgical History:  Procedure Laterality Date   BALLOON  DILATION N/A 08/14/2021   Procedure: BALLOON DILATION;  Surgeon: Meridee Score Netty Starring., MD;  Location: [facility] ENDOSCOPY;  Service: Gastroenterology;  Laterality: N/A;   BALLOON DILATION N/A 02/20/2022   Procedure: BALLOON DILATION;  Surgeon: Rachael Fee, MD;  Location: [facility]OSCOPY;  Service: Gastroenterology;  Laterality: N/A;   BALLOON DILATION N/A 03/27/2022   Procedure: BALLOON DILATION;  Surgeon: Meridee Score Netty Starring., MD;  Location: [facility] ENDOSCOPY;  Service: Gastroenterology;  Laterality: N/A;   BARIATRIC  SURGERY  01/2021   lap band    BILIARY STENT PLACEMENT N/A 08/14/2021   Procedure: AXIOS STENT PLACEMENT;  Surgeon: Lemar Lofty., MD;  Location: [facility] ENDOSCOPY;  Service: Gastroenterology;  Laterality: N/A;   BILIARY STENT PLACEMENT N/A 10/16/2022   Procedure: STENT PLACEMENT;  Surgeon: Lemar Lofty., MD;  Location: [facility]OSCOPY;  Service: Gastroenterology;  Laterality: N/A;   BIOPSY  07/08/2021   Procedure: BIOPSY;  Surgeon: Meridee Score Netty Starring., MD;  Location: [facility] ENDOSCOPY;  Service: Gastroenterology;;   BIOPSY  08/14/2021   Procedure: BIOPSY;  Surgeon: Lemar Lofty., MD;  Location: [facility] ENDOSCOPY;  Service: Gastroenterology;;   BIOPSY  10/16/2022   Procedure: BIOPSY;  Surgeon: Lemar Lofty., MD;  Location: [facility] ENDOSCOPY;  Service: Gastroenterology;;   CARDIAC CATHETERIZATION N/A 05/08/2015   Procedure: Left Heart Cath and Coronary Angiography;  Surgeon: Corky Crafts, MD;  Location: [facility] INVASIVE CV LAB;  Service: Cardiovascular;  Laterality: N/A;   CARPAL TUNNEL RELEASE Bilateral    CHOLECYSTECTOMY     COLONOSCOPY  06/27/2005   COLONOSCOPY  10/03/2014   CORONARY STENT INTERVENTION N/A 04/15/2021   Procedure: CORONARY STENT INTERVENTION;  Surgeon: Runell Gess, MD;  Location: [facility] INVASIVE CV LAB;  Service: Cardiovascular;  Laterality: N/A;   DUODENAL STENT PLACEMENT N/A 11/18/2021   Procedure: DUODENAL STENT PLACEMENT;  Surgeon: Meridee Score Netty Starring., MD;  Location: [facility] ENDOSCOPY;  Service: Gastroenterology;  Laterality: N/A;  axios placed at GJ anastamosis   DUODENAL STENT PLACEMENT  03/27/2022   Procedure: GASTRIC STENT PLACEMENT;  Surgeon: Meridee Score Netty Starring., MD;  Location: [facility] ENDOSCOPY;  Service: Gastroenterology;;   ESOPHAGEAL DILATION  05/27/2021   Procedure: ESOPHAGEAL DILATION;  Surgeon: Lemar Lofty., MD;  Location: [facility]COPY;  Service: Gastroenterology;;   ESOPHAGEAL DILATION  01/23/2022   Procedure: ESOPHAGEAL  DILATION;  Surgeon: Lemar Lofty., MD;  Location: [facility] ENDOSCOPY;  Service: Gastroenterology;;   ESOPHAGEAL STENT PLACEMENT N/A 05/27/2021   Procedure: ESOPHAGEAL STENT PLACEMENT;  Surgeon: Lemar Lofty., MD;  Location: [facility] ENDOSCOPY;  Service: Gastroenterology;  Laterality: N/A;   ESOPHAGOGASTRODUODENOSCOPY  06/27/2005   ESOPHAGOGASTRODUODENOSCOPY N/A 03/19/2021   Procedure: ESOPHAGOGASTRODUODENOSCOPY (EGD);  Surgeon: Sheliah Hatch, De Blanch, MD;  Location: [facility] ENDOSCOPY;  Service: General;  Laterality: N/A;   ESOPHAGOGASTRODUODENOSCOPY N/A 08/16/2021   Procedure: ESOPHAGOGASTRODUODENOSCOPY (EGD);  Surgeon: Lemar Lofty., MD;  Location: [facility] ENDOSCOPY;  Service: Gastroenterology;  Laterality: N/A;   ESOPHAGOGASTRODUODENOSCOPY (EGD) WITH PROPOFOL N/A 05/27/2021   Procedure: ESOPHAGOGASTRODUODENOSCOPY (EGD) WITH PROPOFOL;  Surgeon: Meridee Score Netty Starring., MD;  Location: [facility] ENDOSCOPY;  Service: Gastroenterology;  Laterality: N/A;   ESOPHAGOGASTRODUODENOSCOPY (EGD) WITH PROPOFOL N/A 07/08/2021   Procedure: ESOPHAGOGASTRODUODENOSCOPY (EGD) WITH PROPOFOL;  Surgeon: Meridee Score Netty Starring., MD;  Location: [facility] ENDOSCOPY;  Service: Gastroenterology;  Laterality: N/A;  fluoro   ESOPHAGOGASTRODUODENOSCOPY (EGD) WITH PROPOFOL N/A 08/14/2021   Procedure: ESOPHAGOGASTRODUODENOSCOPY (EGD) WITH PROPOFOL;  Surgeon: Meridee Score Netty Starring., MD;  Location: [facility] ENDOSCOPY;  Service: Gastroenterology;  Laterality: N/A;  fluoro Axios stent (20 mm)   ESOPHAGOGASTRODUODENOSCOPY (EGD)  WITH PROPOFOL N/A 10/16/2021   Procedure: ESOPHAGOGASTRODUODENOSCOPY (EGD) WITH PROPOFOL;  Surgeon: Meridee Score Netty Starring., MD;  Location: [facility] ENDOSCOPY;  Service: Gastroenterology;  Laterality: N/A;  axios stent pull fluoro   ESOPHAGOGASTRODUODENOSCOPY (EGD) WITH PROPOFOL N/A 11/18/2021   Procedure: ESOPHAGOGASTRODUODENOSCOPY (EGD) WITH PROPOFOL;  Surgeon: Meridee Score Netty Starring., MD;  Location: [facility] ENDOSCOPY;  Service:  Gastroenterology;  Laterality: N/A;   ESOPHAGOGASTRODUODENOSCOPY (EGD) WITH PROPOFOL N/A 01/23/2022   Procedure: ESOPHAGOGASTRODUODENOSCOPY (EGD) WITH PROPOFOL;  Surgeon: Meridee Score Netty Starring., MD;  Location: [facility] ENDOSCOPY;  Service: Gastroenterology;  Laterality: N/A;   ESOPHAGOGASTRODUODENOSCOPY (EGD) WITH PROPOFOL N/A 02/20/2022   Procedure: ESOPHAGOGASTRODUODENOSCOPY (EGD) WITH PROPOFOL;  Surgeon: Rachael Fee, MD;  Location: [facility] ENDOSCOPY;  Service: Gastroenterology;  Laterality: N/A;   ESOPHAGOGASTRODUODENOSCOPY (EGD) WITH PROPOFOL N/A 03/27/2022   Procedure: ESOPHAGOGASTRODUODENOSCOPY (EGD) WITH PROPOFOL;  Surgeon: Meridee Score Netty Starring., MD;  Location: [facility] ENDOSCOPY;  Service: Gastroenterology;  Laterality: N/A;   ESOPHAGOGASTRODUODENOSCOPY (EGD) WITH PROPOFOL N/A 08/14/2022   Procedure: ESOPHAGOGASTRODUODENOSCOPY (EGD) WITH PROPOFOL;  Surgeon: Meridee Score Netty Starring., MD;  Location: [facility] ENDOSCOPY;  Service: Gastroenterology;  Laterality: N/A;   ESOPHAGOGASTRODUODENOSCOPY (EGD) WITH PROPOFOL N/A 10/16/2022   Procedure: ESOPHAGOGASTRODUODENOSCOPY (EGD) WITH PROPOFOL;  Surgeon: Meridee Score Netty Starring., MD;  Location: [facility] ENDOSCOPY;  Service: Gastroenterology;  Laterality: N/A;   ESOPHAGOGASTRODUODENOSCOPY (EGD) WITH PROPOFOL N/A 11/28/2022   Procedure: ESOPHAGOGASTRODUODENOSCOPY (EGD) WITH PROPOFOL WITH AXIOS STENT REMOVAL;  Surgeon: Meridee Score Netty Starring., MD;  Location: [facility] ENDOSCOPY;  Service: Gastroenterology;  Laterality: N/A;   FLEXIBLE SIGMOIDOSCOPY N/A 08/16/2021   Procedure: FLEXIBLE SIGMOIDOSCOPY;  Surgeon: Meridee Score Netty Starring., MD;  Location: [facility] ENDOSCOPY;  Service: Gastroenterology;  Laterality: N/A;   FOREIGN BODY REMOVAL  02/20/2022   Procedure: FOREIGN BODY REMOVAL;  Surgeon: Rachael Fee, MD;  Location: [facility] ENDOSCOPY;  Service: Gastroenterology;;   FOREIGN BODY REMOVAL ESOPHAGEAL  03/27/2022   Procedure: REMOVAL FOREIGN BODY;  Surgeon: Meridee Score Netty Starring., MD;   Location: [facility] ENDOSCOPY;  Service: Gastroenterology;;   GASTRIC ROUX-EN-Y N/A 02/18/2021   Procedure: LAPAROSCOPIC ROUX-EN-Y GASTRIC BYPASS WITH UPPER ENDOSCOPY,;  Surgeon: Sheliah Hatch De Blanch, MD;  Location: [facility] ORS;  Service: General;  Laterality: N/A;   GASTRIC ROUX-EN-Y N/A 12/02/2022   Procedure: LAPAROSCOPIC converted to OPEN GASTRIC BYPASS REVERSAL, TAKEDOWN OF GASTROJEJUNAL ANASTOMOSIS, GASTRO GASTRIC ANASTOMOSIS WITH UPPER ENDOSCOPY 1. Laparoscopic lysis of adhesions 2. Partial gastrectomy 3. Small bowel resection 4. Creation of esophagus to gastric anastomosis 5. Creation of gastrostomy;  Surgeon: Kinsinger, De Blanch, MD;  Location: [facility] ORS;  Service: General;  Laterality:    GASTROINTESTINAL STENT REMOVAL  01/23/2022   Procedure: AXIOS STENT REMOVAL;  Surgeon: Lemar Lofty., MD;  Location: [facility] ENDOSCOPY;  Service: Gastroenterology;;   GASTROSTOMY N/A 12/02/2022   Procedure: INSERTION OF GASTROSTOMY TUBE;  Surgeon: Rodman Pickle, MD;  Location: [facility] ORS;  Service: General;  Laterality: N/A;   HEMORRHOID SURGERY  08/25/1997   IR GJ TUBE CHANGE  03/27/2021   LAPAROSCOPIC INSERTION GASTROSTOMY TUBE N/A 03/20/2021   Procedure: LAPAROSCOPIC INSERTION GASTROSTOMY TUBE;  Surgeon: Rodman Pickle, MD;  Location: [facility];  Service: General;  Laterality: N/A;   LEFT HEART CATH AND CORONARY ANGIOGRAPHY N/A 04/15/2021   Procedure: LEFT HEART CATH AND CORONARY ANGIOGRAPHY;  Surgeon: Runell Gess, MD;  Location: [facility] INVASIVE CV LAB;  Service: Cardiovascular;  Laterality: N/A;   MOUTH SURGERY     removed area which was benign   STENT REMOVAL  07/08/2021   Procedure: STENT REMOVAL;  Surgeon: Lemar Lofty., MD;  Location:  [facility] ENDOSCOPY;  Service: Gastroenterology;;   Francine Graven REMOVAL  10/16/2021   Procedure: STENT REMOVAL;  Surgeon: Lemar Lofty., MD;  Location: [facility] ENDOSCOPY;  Service: Gastroenterology;;   Francine Graven REMOVAL  08/14/2022   Procedure: STENT REMOVAL;   Surgeon: Lemar Lofty., MD;  Location: [facility] ENDOSCOPY;  Service: Gastroenterology;;   Francine Graven REMOVAL  11/28/2022   Procedure: STENT REMOVAL;  Surgeon: Lemar Lofty., MD;  Location: [facility] ENDOSCOPY;  Service: Gastroenterology;;   STERIOD INJECTION  01/23/2022   Procedure: STEROID INJECTION;  Surgeon: Lemar Lofty., MD;  Location: [facility] ENDOSCOPY;  Service: Gastroenterology;;   SUBMUCOSAL INJECTION  11/18/2021   Procedure: SUBMUCOSAL INJECTION;  Surgeon: Lemar Lofty., MD;  Location: [facility] ENDOSCOPY;  Service: Gastroenterology;;   SUBMUCOSAL INJECTION  08/14/2022   Procedure: SUBMUCOSAL INJECTION;  Surgeon: Lemar Lofty., MD;  Location: [facility] ENDOSCOPY;  Service: Gastroenterology;;   TONSILLECTOMY     UPPER GI ENDOSCOPY N/A 03/20/2021   Procedure: UPPER GI ENDOSCOPY;  Surgeon: Kinsinger, De Blanch, MD;  Location: [facility] ORS;  Service: General;  Laterality: N/A;    Social History:  reports that he quit smoking about 33 years ago. His smoking use included cigarettes. He started smoking about 48 years ago. He has a 15 pack-year smoking history. He has never used smokeless tobacco. He reports current alcohol use. He reports that he does not use drugs.   Allergies  Allergen Reactions   Other Itching and Other (See Comments)    Cats Wheezing, runny nose/eyes, congestion     Family History  Problem Relation Age of Onset   Hypertension Mother    Diabetes Mother    Heart attack Father 39   CAD Father    Hypertension Sister    Diabetes Other    Hypertension Other    Heart disease Other    Colon cancer Neg Hx    Esophageal cancer Neg Hx    Pancreatic cancer Neg Hx    Stomach cancer Neg Hx    Liver disease Neg Hx    Inflammatory bowel disease Neg Hx    Rectal cancer Neg Hx      Prior to Admission medications   Medication Sig Start Date End Date Taking? Authorizing Provider  aspirin EC 81 MG tablet Take 1 tablet (81 mg total) by mouth daily. Use while  holding Plavix. 08/14/22 08/14/23  Mansouraty, Netty Starring., MD  atorvastatin (LIPITOR) 10 MG tablet Take 1 tablet (10 mg total) by mouth daily. 09/01/22 12/02/22  Antonieta Iba, MD  clopidogrel (PLAVIX) 75 MG tablet Take 1 tablet (75 mg total) by mouth daily. 04/11/23   Mansouraty, Netty Starring., MD  Continuous Glucose Sensor (FREESTYLE LIBRE 14 DAY SENSOR) MISC Insert 1 sensor to the back of the arm as directed every 14 days for continuous glucose monitoring 11/24/22     fluticasone (FLONASE) 50 MCG/ACT nasal spray Place 2-3 sprays into both nostrils daily as needed for allergies or rhinitis.    [provider]  insulin lispro (HUMALOG) 100 UNIT/ML KwikPen Inject subcutaneously 3 (three) times daily with meals Per sliding scale 11/24/22     Insulin Pen Needle (UNIFINE PENTIPS) 32G X 4 MM MISC Use as needed as directed 12/22/22     loratadine (CLARITIN) 10 MG tablet Take 10 mg by mouth daily.    [provider]  mometasone (ELOCON) 0.1 % lotion Apply to affected areas once daily Patient taking differently: Apply 1 Application topically as needed (psoriasis). 11/19/21     Multiple Vitamin (  MULTI-VITAMIN DAILY PO) Take 1 patch by mouth daily.    [provider]  nitroGLYCERIN (NITROSTAT) 0.4 MG SL tablet Place 1 tablet (0.4 mg total) under the tongue every 5 (five) minutes as needed for chest pain. 04/25/21 11/19/22  Antonieta Iba, MD  omeprazole (PRILOSEC) 40 MG capsule Take 1 capsule (40 mg total) by mouth daily. 04/09/23   Mansouraty, Netty Starring., MD  ondansetron (ZOFRAN-ODT) 4 MG disintegrating tablet Take 1 tablet (4 mg total) by mouth every 6 (six) hours as needed for nausea or vomiting. 04/09/23   Mansouraty, Netty Starring., MD  Potassium Chloride ER 20 MEQ TBCR Take 1 tablet by mouth 2 times daily Patient not taking: Reported on 11/19/2022 08/27/21     prochlorperazine (COMPAZINE) 25 MG suppository Place 1 suppository (25 mg total) rectally every 12 (twelve) hours as needed for  nausea or vomiting. 04/09/23   Mansouraty, Netty Starring., MD  prochlorperazine (COMPAZINE) 5 MG tablet Take 1 tablet (5 mg total) by mouth every 6 (six) hours as needed for nausea or vomiting. 04/09/23   Mansouraty, Netty Starring., MD  propranolol (INDERAL) 20 MG tablet Take 1 tablet (20 mg total) by mouth every 8 (eight) hours. Patient taking differently: Take 20 mg by mouth daily. 09/26/22   Mallipeddi, Vishnu P, MD  Propylene Glycol (SYSTANE BALANCE OP) Place 2-3 drops into both eyes daily as needed (dry eyes).    [provider]  Pseudoeph-Doxylamine-DM-APAP (NYQUIL PO) Take 2 tablets by mouth at bedtime as needed (pain).    [provider]  sucralfate (CARAFATE) 1 g tablet Take 1 tablet (1 g total) by mouth 4 (four) times daily before meals and nightly 01/01/23     VITAMIN E PO Take 1 tablet by mouth daily.    [provider]    Physical Exam: BP (!) 152/94 (BP Location: Right Arm)   Pulse 97   Temp 98.4 F (36.9 C) (Oral)   Resp 18   Ht 6\' 2"  (1.88 m)   Wt 88 kg   SpO2 96%   BMI 24.91 kg/m   General:  Alert, oriented, calm, in no acute distress  Eyes: EOMI, clear conjuctivae, white sclerea Neck: supple, no masses, trachea mildline  Cardiovascular: RRR, no murmurs or rubs, no peripheral edema  Respiratory: clear to auscultation bilaterally, no wheezes, no crackles  Abdomen: soft, nontender, nondistended, normal bowel tones heard  Skin: dry, no rashes  Musculoskeletal: no joint effusions, normal range of motion  Psychiatric: appropriate affect, normal speech  Neurologic: extraocular muscles intact, clear speech, moving all extremities with intact sensorium          Labs on Admission:  Basic Metabolic Panel: Recent Labs  Lab 04/11/23 1054  NA 134*  K 3.6  CL 95*  CO2 27  GLUCOSE 157*  BUN 8  CREATININE 0.57*  CALCIUM 8.9   Liver Function Tests: Recent Labs  Lab 04/11/23 1054  AST 19  ALT 19  ALKPHOS 104  BILITOT 1.8*  PROT 6.9  ALBUMIN 3.7    No results for input(s): "LIPASE", "AMYLASE" in the last 168 hours. No results for input(s): "AMMONIA" in the last 168 hours. CBC: Recent Labs  Lab 04/11/23 1054  WBC 9.5  NEUTROABS 7.9*  HGB 16.4  HCT 49.1  MCV 85.5  PLT 219   Cardiac Enzymes: No results for input(s): "CKTOTAL", "CKMB", "CKMBINDEX", "TROPONINI" in the last 168 hours.  BNP (last 3 results) No results for input(s): "BNP" in the last 8760 hours.  ProBNP (  last 3 results) No results for input(s): "PROBNP" in the last 8760 hours.  CBG: Recent Labs  Lab 04/09/23 1459 04/09/23 1637  GLUCAP 125* 124*    Radiological Exams on Admission: DG Chest Port 1 View  Result Date: 04/09/2023 CLINICAL DATA:  Nausea and vomiting. Status post EGD. Evaluate for bowel perforation. EXAM: PORTABLE CHEST 1 VIEW COMPARISON:  None Available. FINDINGS: The lungs are clear. There is no pleural effusion pneumothorax. The cardiac silhouette is within normal limits. No bowel dilatation or evidence of obstruction. No obvious free air. Evaluation however is limited due to body habitus. The osseous structures are intact. The soft tissues are unremarkable. IMPRESSION: 1. No active disease. 2. No obvious free air. Electronically Signed   By: Elgie Collard M.D.   On: 04/09/2023 18:20   DG Abd Portable 2V  Result Date: 04/09/2023 CLINICAL DATA:  Nausea and vomiting. Status post EGD. Evaluate for bowel perforation. EXAM: PORTABLE CHEST 1 VIEW COMPARISON:  None Available. FINDINGS: The lungs are clear. There is no pleural effusion pneumothorax. The cardiac silhouette is within normal limits. No bowel dilatation or evidence of obstruction. No obvious free air. Evaluation however is limited due to body habitus. The osseous structures are intact. The soft tissues are unremarkable. IMPRESSION: 1. No active disease. 2. No obvious free air. Electronically Signed   By: Elgie Collard M.D.   On: 04/09/2023 18:20    Assessment/Plan This is a 27 old  gentleman with a history of CAD, GERD, hypertension and post bariatric surgery anastomotic strictures being admitted to the hospital for observation after recent gastroesophageal stent placement.  Oral intolerance and abdominal discomfort-after gastroesophageal stent placement -Observation admission -Keep n.p.o. -IV fluids -Anticipate stent removal later today with Dr. Rhea Belton  GERD-Protonix IV  CAD-continue Plavix  Other chronic nonessential home medications will be held until he is tolerating p.o. intake comfortably  DVT prophylaxis: Lovenox     Code Status: Full Code  Consults called: None  Admission status: Observation  Time spent: 43 minutes  Kellan Raffield Sharlette Dense MD [facility] Pager [phone]  If 7PM-7AM, please contact night-coverage [email or web address] Password [facility]  04/11/2023, 12:24 PM

## 2023-04-11 NOTE — ED Triage Notes (Signed)
Pt arrived via POV, recently had axios stent for esophagogastric anastomosis. Has not been able to tolerate POs due to n/v and pain. Has spoken with GI multiple times over the past couple days and told to come to ED if sx worsening.

## 2023-04-12 DIAGNOSIS — R066 Hiccough: Secondary | ICD-10-CM | POA: Diagnosis not present

## 2023-04-12 DIAGNOSIS — R112 Nausea with vomiting, unspecified: Secondary | ICD-10-CM | POA: Diagnosis not present

## 2023-04-12 DIAGNOSIS — K222 Esophageal obstruction: Secondary | ICD-10-CM | POA: Diagnosis not present

## 2023-04-12 LAB — CBC
HCT: 43.8 % (ref 39.0–52.0)
Hemoglobin: 14.4 g/dL (ref 13.0–17.0)
MCH: 28.6 pg (ref 26.0–34.0)
MCHC: 32.9 g/dL (ref 30.0–36.0)
MCV: 87.1 fL (ref 80.0–100.0)
Platelets: 168 10*3/uL (ref 150–400)
RBC: 5.03 MIL/uL (ref 4.22–5.81)
RDW: 13.9 % (ref 11.5–15.5)
WBC: 7 10*3/uL (ref 4.0–10.5)
nRBC: 0 % (ref 0.0–0.2)

## 2023-04-12 LAB — BASIC METABOLIC PANEL
Anion gap: 9 (ref 5–15)
BUN: 9 mg/dL (ref 6–20)
CO2: 27 mmol/L (ref 22–32)
Calcium: 8.5 mg/dL — ABNORMAL LOW (ref 8.9–10.3)
Chloride: 98 mmol/L (ref 98–111)
Creatinine, Ser: 0.48 mg/dL — ABNORMAL LOW (ref 0.61–1.24)
GFR, Estimated: >60 mL/min (ref >60)
Glucose, Bld: 168 mg/dL — ABNORMAL HIGH (ref 70–99)
Potassium: 3.6 mmol/L (ref 3.5–5.1)
Sodium: 134 mmol/L — ABNORMAL LOW (ref 135–145)

## 2023-04-12 MED ORDER — PANTOPRAZOLE SODIUM 40 MG IV SOLR
40.0000 mg | Freq: Two times a day (BID) | INTRAVENOUS | Status: DC
  Administered 2023-04-12: 40 mg via INTRAVENOUS
  Filled 2023-04-12: qty 10

## 2023-04-12 MED ORDER — PANTOPRAZOLE SODIUM 40 MG PO TBEC: 40.0000 mg | DELAYED_RELEASE_TABLET | Freq: Two times a day (BID) | ORAL | 3 refills | Status: DC

## 2023-04-12 MED ORDER — SUCRALFATE 1 G PO TABS
1.0000 g | ORAL_TABLET | Freq: Three times a day (TID) | ORAL | Status: DC
  Administered 2023-04-12 (×2): 1 g via ORAL
  Filled 2023-04-12 (×2): qty 1

## 2023-04-12 NOTE — Discharge Summary (Signed)
Physician Discharge Summary   Patient: Hector Curry MRN: [id number] DOB: June 13, 1961  Admit date:     04/11/2023  Discharge date: 04/12/23  Discharge Physician: Thad Ranger, MD    PCP: Marguarite Arbour, MD   Recommendations at discharge:   Protonix 40 mg twice daily Resume plavix, DC aspirin SOFT diet   Discharge Diagnoses:    Esophageal stricture   Nausea and vomiting   Hiccups GERD CAD Diabetes mellitus, type II IDDM  Hospital Course:  Patient is a 62 year old male with history of CAD, GERD, HTN, post-bariatric surgery anastomotic stricture was admitted for observation.  Patient had recent GE stent placement, has had multiple balloon dilations and stenting to his gastroduodenal anastomosis in the past.  Patient had GE stent placed by Dr. Meridee Score on 8/15 with difficult to control nausea thereafter.  He was unable to tolerate p.o. with intractable pain for hiccups and regurgitation.  Patient was admitted for observation after removal of Axios.   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 -Patient underwent EGD on 8/17 by Dr. Rhea Belton, found to have fluid in the entire esophagus, aspirated, esophageal stent was removed.  GE junction post stent removal was patent.  Normal stomach, normal examined duodenum -Patient was admitted overnight for observation, recommended advance diet, twice daily PPI, Carafate 3 times daily before meals and at bedtime as needed -Patient is not tolerating soft diet, cleared by GI to discharge home  GERD -Continue Protonix twice daily  CAD -Patient can now resume Plavix -Continue statin, beta-blocker  Mild hyponatremia -Sodium 134, likely due to dehydration, poor oral intake -Patient was placed on IV fluid hydration, sodium remained stable, now tolerating diet   Plan discussed  with the patient and wife at the bedside.  Tolerating diet, feels ready to discharge home      Pain control - [facility] Controlled Substance Reporting System database was reviewed. and patient was instructed, not to drive, operate heavy machinery, perform activities at heights, swimming or participation in water activities or provide baby-sitting services while on Pain, Sleep and Anxiety Medications; until their outpatient Physician has advised to do so again. Also recommended to not to take more than prescribed Pain, Sleep and Anxiety Medications.  Consultants: GI Procedures performed: Upper endoscopy with removal of stent Disposition: Home Diet recommendation: Soft diet  DISCHARGE MEDICATION: Allergies as of 04/12/2023       Reactions   Other Itching, Other (See Comments)   Cats Wheezing, runny nose/eyes, congestion         Medication List     STOP taking these medications    aspirin EC 81 MG tablet   omeprazole 40 MG capsule Commonly known as: PRILOSEC       TAKE these medications    atorvastatin 10 MG tablet Commonly known as: LIPITOR Take 1 tablet (10 mg total) by mouth daily.   clopidogrel 75 MG tablet Commonly known as: PLAVIX Take 1 tablet (75 mg total) by mouth daily.   fluticasone 50 MCG/ACT nasal spray Commonly known as: FLONASE Place 2-3 sprays into both nostrils daily as needed for allergies or rhinitis.   FreeStyle Libre 14 Day Sensor Misc Insert 1 sensor to the back of the arm as directed every 14 days for continuous glucose monitoring   insulin lispro 100 UNIT/ML KwikPen Commonly known as: HUMALOG Inject subcutaneously 3 (three) times daily with meals Per sliding scale  What changed:  how much to take when to take this reasons to take this   mometasone 0.1 % lotion Commonly known as: ELOCON Apply to affected areas once daily What changed:  how much to take when to take this reasons to take this   MULTI-VITAMIN DAILY PO Take 1  patch by mouth daily.   nitroGLYCERIN 0.4 MG SL tablet Commonly known as: NITROSTAT Place 1 tablet (0.4 mg total) under the tongue every 5 (five) minutes as needed for chest pain.   NYQUIL PO Take 2 tablets by mouth at bedtime as needed (pain).   ondansetron 4 MG disintegrating tablet Commonly known as: ZOFRAN-ODT Take 1 tablet (4 mg total) by mouth every 6 (six) hours as needed for nausea or vomiting.   OVER THE COUNTER MEDICATION Apply 1 Application topically daily at 6 (six) AM. Medication: Bariatric patch   pantoprazole 40 MG tablet Commonly known as: Protonix Take 1 tablet (40 mg total) by mouth 2 (two) times daily before a meal.   prochlorperazine 25 MG suppository Commonly known as: COMPAZINE Place 1 suppository (25 mg total) rectally every 12 (twelve) hours as needed for nausea or vomiting.   prochlorperazine 5 MG tablet Commonly known as: COMPAZINE Take 1 tablet (5 mg total) by mouth every 6 (six) hours as needed for nausea or vomiting.   propranolol 40 MG/5ML solution Take 5 mg by mouth every evening.   sucralfate 1 g tablet Commonly known as: CARAFATE Take 1 tablet (1 g total) by mouth 4 (four) times daily before meals and nightly   SYSTANE BALANCE OP Place 2-3 drops into both eyes daily as needed (dry eyes).   TechLite Plus Pen Needles 32G X 4 MM Misc Generic drug: Insulin Pen Needle Use as needed as directed   VITAMIN E PO Take 1 tablet by mouth daily.        Follow-up Information     Marguarite Arbour, MD. Schedule an appointment as soon as possible for a visit in 2 week(s).   Specialty: Internal Medicine Why: for hospital follow-up Contact information: [address] [place] [address] [phone]         Mansouraty, Netty Starring., MD. Schedule an appointment as soon as possible for a visit in 2 week(s).   Specialties: Gastroenterology, Internal Medicine Why: for hospital follow-up Contact information: [address] [place]  [place] [address] [phone]                Discharge Exam: Ceasar Mons Weights   04/11/23 0906 04/11/23 1332  Weight: 88 kg 88 kg   S: Feels a lot better today, tolerating diet, no nausea vomiting or hiccups.  Feels ready to go home.  Wife at the bedside.  BP 123/81 (BP Location: Left Arm)   Pulse 66   Temp 98.2 F (36.8 C) (Oral)   Resp 20   Ht 6\' 2"  (1.88 m)   Wt 88 kg   SpO2 96%   BMI 24.91 kg/m   Physical Exam General: Alert and oriented x 3, NAD Cardiovascular: S1 S2 clear, RRR.  Respiratory: CTAB, no wheezing Gastrointestinal: Soft, nontender, nondistended, NBS Ext: no pedal edema bilaterally Neuro: no new deficits Psych: Normal affect    Condition at discharge: fair  The results of significant diagnostics from this hospitalization (including imaging, microbiology, ancillary and laboratory) are listed below for reference.   Imaging Studies: DG Chest Port 1 View  Result Date: 04/09/2023 CLINICAL DATA:  Nausea and vomiting. Status post EGD. Evaluate for  bowel perforation. EXAM: PORTABLE CHEST 1 VIEW COMPARISON:  None Available. FINDINGS: The lungs are clear. There is no pleural effusion pneumothorax. The cardiac silhouette is within normal limits. No bowel dilatation or evidence of obstruction. No obvious free air. Evaluation however is limited due to body habitus. The osseous structures are intact. The soft tissues are unremarkable. IMPRESSION: 1. No active disease. 2. No obvious free air. Electronically Signed   By: Elgie Collard M.D.   On: 04/09/2023 18:20   DG Abd Portable 2V  Result Date: 04/09/2023 CLINICAL DATA:  Nausea and vomiting. Status post EGD. Evaluate for bowel perforation. EXAM: PORTABLE CHEST 1 VIEW COMPARISON:  None Available. FINDINGS: The lungs are clear. There is no pleural effusion pneumothorax. The cardiac silhouette is within normal limits. No bowel dilatation or evidence of obstruction. No obvious free air. Evaluation however is limited due to  body habitus. The osseous structures are intact. The soft tissues are unremarkable. IMPRESSION: 1. No active disease. 2. No obvious free air. Electronically Signed   By: Elgie Collard M.D.   On: 04/09/2023 18:20    Microbiology: Results for orders placed or performed during the hospital encounter of 07/13/22  Group A Strep by PCR     Status: None   Collection Time: 07/13/22  8:46 AM   Specimen: Throat; Sterile Swab  Result Value Ref Range Status   Group A Strep by PCR NOT DETECTED NOT DETECTED Final    Comment: Performed at [facility], [address] [place], [place] [address]    Labs: CBC: Recent Labs  Lab 04/11/23 1054 04/12/23 0445  WBC 9.5 7.0  NEUTROABS 7.9*  --   HGB 16.4 14.4  HCT 49.1 43.8  MCV 85.5 87.1  PLT 219 168   Basic Metabolic Panel: Recent Labs  Lab 04/11/23 1054 04/12/23 0445  NA 134* 134*  K 3.6 3.6  CL 95* 98  CO2 27 27  GLUCOSE 157* 168*  BUN 8 9  CREATININE 0.57* 0.48*  CALCIUM 8.9 8.5*   Liver Function Tests: Recent Labs  Lab 04/11/23 1054  AST 19  ALT 19  ALKPHOS 104  BILITOT 1.8*  PROT 6.9  ALBUMIN 3.7   CBG: Recent Labs  Lab 04/09/23 1459 04/09/23 1637 04/11/23 1447  GLUCAP 125* 124* 151*    Discharge time spent: greater than 30 minutes.  Signed: Thad Ranger, MD [facility] 04/12/2023

## 2023-04-12 NOTE — Progress Notes (Signed)
     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  Unable to tolerate Axios.   S/P successful removal  8/17 by Dr Rhea Belton   Plan: -Soft diet as before -Continue Protonix BID/Carafate slurry as per EGD note -Follow-up with Dr. Meridee Score as outpt -Anticipate D/C today. -D/W pt and wife     SUBJECTIVE   Feels much better after Axios removal No further hiccups or abdominal pain    OBJECTIVE:     Vital signs in last 24 hours: Temp:  [97.9 F (36.6 C)-98.8 F (37.1 C)] 97.9 F (36.6 C) (08/18 0558) Pulse Rate:  [54-121] 54 (08/18 0558) Resp:  [15-20] 18 (08/18 0558) BP: (132-195)/(76-104) 141/77 (08/18 0558) SpO2:  [94 %-100 %] 98 % (08/18 0558) Weight:  [88 kg] 88 kg (08/17 1332) Last BM Date : 04/09/23 General:   Alert, well-developed male in NAD Abdomen:  Soft, nondistended, nontender.  Normal bowel sounds,.       Neurologic:  Alert and  oriented x4;  grossly normal neurologically. Psych:  Pleasant, cooperative.  Normal mood and affect.   Intake/Output from previous day: 08/17 0701 - 08/18 0700 In: 1610.5 [I.V.:1610.5] Out: 1 [Emesis/NG output:1] Intake/Output this shift: No intake/output data recorded.  Lab Results: Recent Labs    04/11/23 1054 04/12/23 0445  WBC 9.5 7.0  HGB 16.4 14.4  HCT 49.1 43.8  PLT 219 168   BMET Recent Labs    04/11/23 1054 04/12/23 0445  NA 134* 134*  K 3.6 3.6  CL 95* 98  CO2 27 27  GLUCOSE 157* 168*  BUN 8 9  CREATININE 0.57* 0.48*  CALCIUM 8.9 8.5*   LFT Recent Labs    04/11/23 1054  PROT 6.9  ALBUMIN 3.7  AST 19  ALT 19  ALKPHOS 104  BILITOT 1.8*   PT/INR No results for input(s): "LABPROT", "INR" in the last 72 hours. Hepatitis Panel No results for input(s): "HEPBSAG", "HCVAB", "HEPAIGM", "HEPBIGM" in the last 72 hours.  No results found.   Principal Problem:   Esophageal stricture Active Problems:   Nausea  and vomiting   Hiccups     LOS: 0 days     Edman Circle, MD 04/12/2023, 10:45 AM [facility] [phone]

## 2023-04-13 ENCOUNTER — Telehealth: Payer: Self-pay

## 2023-04-13 ENCOUNTER — Encounter (HOSPITAL_COMMUNITY): Payer: Self-pay | Admitting: Gastroenterology

## 2023-04-13 NOTE — Telephone Encounter (Signed)
04/30/23 at 1230 pm at [facility] with GM   New instructions sent to the pt via My Chart per pt preference.

## 2023-04-13 NOTE — Telephone Encounter (Signed)
-----   Message from [facility] sent at 04/13/2023 12:07 PM EDT ----- JMP, Thanks for helping with this patient this weekend. Does look like we were able to open things up at least for the short period of time that he had the AXIOS stent.  Albirda Shiel, We need to reschedule this patient's EGD.  I would like to redo it in 2 to 4 weeks.  He can be done in the hospital-based setting or in the outpatient setting (okay to use a 7:30 AM slot if I need to or a 1 PM slot if I need to. We need to be able to do balloon dilations to keep this open. Thanks. GM ----- Message ----- From: Beverley Fiedler, MD Sent: 04/11/2023   3:38 PM EDT To: Lemar Lofty., MD  Fyi Reminder for followup JMP

## 2023-04-16 ENCOUNTER — Other Ambulatory Visit (HOSPITAL_COMMUNITY): Payer: Self-pay

## 2023-04-16 ENCOUNTER — Other Ambulatory Visit: Payer: Self-pay | Admitting: Internal Medicine

## 2023-04-17 ENCOUNTER — Inpatient Hospital Stay: Payer: Managed Care, Other (non HMO) | Attending: Internal Medicine

## 2023-04-20 ENCOUNTER — Encounter: Payer: Self-pay | Admitting: Gastroenterology

## 2023-04-20 ENCOUNTER — Other Ambulatory Visit (HOSPITAL_COMMUNITY): Payer: Self-pay

## 2023-04-20 NOTE — Telephone Encounter (Signed)
[name], Please let [name] know that I am happy to hear that he is doing so well. I will see him for his repeat EGD so that we can ensure that things stay open and plan a balloon dilation moving forward in case we see any evidence of recurrence of his stricture.  Will plan to also likely use steroids at that time. I will see him in a few weeks. GM

## 2023-04-21 ENCOUNTER — Encounter (HOSPITAL_COMMUNITY): Payer: Self-pay | Admitting: Gastroenterology

## 2023-04-22 ENCOUNTER — Ambulatory Visit (HOSPITAL_COMMUNITY): Payer: Managed Care, Other (non HMO)

## 2023-04-22 NOTE — Progress Notes (Signed)
Attempted to obtain medical history via telephone, unable to reach at this time. HIPAA compliant voicemail message left requesting return call to pre surgical testing department. 

## 2023-04-24 ENCOUNTER — Inpatient Hospital Stay: Payer: Managed Care, Other (non HMO) | Admitting: Oncology

## 2023-04-29 ENCOUNTER — Ambulatory Visit: Payer: Managed Care, Other (non HMO) | Admitting: Internal Medicine

## 2023-04-29 NOTE — Progress Notes (Signed)
Erroneous encounter - please disregard.

## 2023-04-30 ENCOUNTER — Ambulatory Visit (HOSPITAL_COMMUNITY): Payer: Managed Care, Other (non HMO)

## 2023-04-30 ENCOUNTER — Encounter (HOSPITAL_COMMUNITY): Payer: Self-pay | Admitting: Gastroenterology

## 2023-04-30 ENCOUNTER — Ambulatory Visit (HOSPITAL_COMMUNITY)
Admission: RE | Admit: 2023-04-30 | Discharge: 2023-04-30 | Disposition: A | Discharge status: Home / Self Care | Payer: Managed Care, Other (non HMO) | Attending: Gastroenterology | Admitting: Gastroenterology

## 2023-04-30 ENCOUNTER — Telehealth: Payer: Self-pay

## 2023-04-30 ENCOUNTER — Other Ambulatory Visit: Payer: Self-pay

## 2023-04-30 ENCOUNTER — Encounter (HOSPITAL_COMMUNITY)
Admission: RE | Disposition: A | Discharge status: Home / Self Care | Payer: Self-pay | Source: Home / Self Care | Attending: Gastroenterology

## 2023-04-30 DIAGNOSIS — K219 Gastro-esophageal reflux disease without esophagitis: Secondary | ICD-10-CM | POA: Insufficient documentation

## 2023-04-30 DIAGNOSIS — Z955 Presence of coronary angioplasty implant and graft: Secondary | ICD-10-CM | POA: Diagnosis not present

## 2023-04-30 DIAGNOSIS — I10 Essential (primary) hypertension: Secondary | ICD-10-CM | POA: Insufficient documentation

## 2023-04-30 DIAGNOSIS — E119 Type 2 diabetes mellitus without complications: Secondary | ICD-10-CM | POA: Diagnosis not present

## 2023-04-30 DIAGNOSIS — D759 Disease of blood and blood-forming organs, unspecified: Secondary | ICD-10-CM | POA: Diagnosis not present

## 2023-04-30 DIAGNOSIS — K9189 Other postprocedural complications and disorders of digestive system: Secondary | ICD-10-CM | POA: Insufficient documentation

## 2023-04-30 DIAGNOSIS — Z7902 Long term (current) use of antithrombotics/antiplatelets: Secondary | ICD-10-CM | POA: Insufficient documentation

## 2023-04-30 DIAGNOSIS — I252 Old myocardial infarction: Secondary | ICD-10-CM | POA: Diagnosis not present

## 2023-04-30 DIAGNOSIS — G4733 Obstructive sleep apnea (adult) (pediatric): Secondary | ICD-10-CM | POA: Insufficient documentation

## 2023-04-30 DIAGNOSIS — Z9889 Other specified postprocedural states: Secondary | ICD-10-CM | POA: Insufficient documentation

## 2023-04-30 DIAGNOSIS — Z9884 Bariatric surgery status: Secondary | ICD-10-CM | POA: Diagnosis not present

## 2023-04-30 DIAGNOSIS — K222 Esophageal obstruction: Secondary | ICD-10-CM | POA: Diagnosis present

## 2023-04-30 DIAGNOSIS — Z79899 Other long term (current) drug therapy: Secondary | ICD-10-CM | POA: Insufficient documentation

## 2023-04-30 DIAGNOSIS — Z794 Long term (current) use of insulin: Secondary | ICD-10-CM | POA: Diagnosis not present

## 2023-04-30 DIAGNOSIS — R1310 Dysphagia, unspecified: Secondary | ICD-10-CM

## 2023-04-30 DIAGNOSIS — I2510 Atherosclerotic heart disease of native coronary artery without angina pectoris: Secondary | ICD-10-CM | POA: Insufficient documentation

## 2023-04-30 DIAGNOSIS — K449 Diaphragmatic hernia without obstruction or gangrene: Secondary | ICD-10-CM

## 2023-04-30 DIAGNOSIS — D649 Anemia, unspecified: Secondary | ICD-10-CM | POA: Insufficient documentation

## 2023-04-30 HISTORY — PX: ESOPHAGOGASTRODUODENOSCOPY (EGD) WITH PROPOFOL: SHX5813

## 2023-04-30 HISTORY — PX: SCLEROTHERAPY: SHX6841

## 2023-04-30 HISTORY — PX: GASTROINTESTINAL STENT REMOVAL: SHX6384

## 2023-04-30 LAB — GLUCOSE, CAPILLARY: Glucose-Capillary: 111 mg/dL — ABNORMAL HIGH (ref 70–99)

## 2023-04-30 SURGERY — ESOPHAGOGASTRODUODENOSCOPY (EGD) WITH PROPOFOL
Anesthesia: Monitor Anesthesia Care

## 2023-04-30 MED ORDER — DEXAMETHASONE SODIUM PHOSPHATE 10 MG/ML IJ SOLN
INTRAMUSCULAR | Status: DC | PRN
Start: 2023-04-30 — End: 2023-04-30
  Administered 2023-04-30: 4 mg via INTRAVENOUS

## 2023-04-30 MED ORDER — PROPOFOL 500 MG/50ML IV EMUL
INTRAVENOUS | Status: DC | PRN
  Administered 2023-04-30: 110 ug/kg/min via INTRAVENOUS

## 2023-04-30 MED ORDER — PROPOFOL 500 MG/50ML IV EMUL
INTRAVENOUS | Status: AC
  Filled 2023-04-30: qty 50

## 2023-04-30 MED ORDER — SODIUM CHLORIDE (PF) 0.9 % IJ SOLN
INTRAMUSCULAR | Status: AC
  Filled 2023-04-30: qty 10

## 2023-04-30 MED ORDER — LACTATED RINGERS IV SOLN: INTRAVENOUS | Status: DC | PRN

## 2023-04-30 MED ORDER — PROPOFOL 10 MG/ML IV BOLUS
INTRAVENOUS | Status: DC | PRN
Start: 2023-04-30 — End: 2023-04-30
  Administered 2023-04-30: 200 mg via INTRAVENOUS

## 2023-04-30 MED ORDER — LIDOCAINE HCL (PF) 2 % IJ SOLN
INTRAMUSCULAR | Status: DC | PRN
  Administered 2023-04-30: 100 mg via INTRADERMAL

## 2023-04-30 MED ORDER — TRIAMCINOLONE ACETONIDE 40 MG/ML IJ SUSP
INTRAMUSCULAR | Status: DC | PRN
  Administered 2023-04-30: 40 mg via INTRAMUSCULAR

## 2023-04-30 MED ORDER — TRIAMCINOLONE ACETONIDE 40 MG/ML IJ SUSP
INTRAMUSCULAR | Status: AC
  Filled 2023-04-30: qty 1

## 2023-04-30 MED ORDER — ONDANSETRON HCL 4 MG/2ML IJ SOLN
INTRAMUSCULAR | Status: DC | PRN
  Administered 2023-04-30: 4 mg via INTRAVENOUS

## 2023-04-30 MED ORDER — SODIUM CHLORIDE 0.9 % IV SOLN: INTRAVENOUS | Status: DC

## 2023-04-30 SURGICAL SUPPLY — 15 items

## 2023-04-30 NOTE — Anesthesia Preprocedure Evaluation (Signed)
Anesthesia Evaluation  Patient identified by MRN, date of birth, ID band Patient awake    Reviewed: Allergy & Precautions, NPO status , Patient's Chart, lab work & pertinent test results, reviewed documented beta blocker date and time   History of Anesthesia Complications Negative for: history of anesthetic complications  Airway Mallampati: III  TM Distance: >3 FB Neck ROM: Full    Dental no notable dental hx. (+) Dental Advisory Given, Teeth Intact   Pulmonary sleep apnea and Continuous Positive Airway Pressure Ventilation , former smoker   Pulmonary exam normal breath sounds clear to auscultation       Cardiovascular hypertension, Pt. on medications and Pt. on home beta blockers (-) angina + CAD, + Past MI and + Cardiac Stents  Normal cardiovascular exam+ dysrhythmias Supra Ventricular Tachycardia  Rhythm:Regular Rate:Normal  '22 CATH: LM nl, LAD 80p (3.0x18 Onyx Frontier DES), D1 90 (too small for PCI), LCX nl, RCA nl. '22 ECHO: EF 50-55%, apical HK, mod asymm basal-septal hypertrophy. Nl RV fxn.  Hx/o NSTEMI post op lap gastric banding   Neuro/Psych negative neurological ROS  negative psych ROS   GI/Hepatic Neg liver ROS,GERD  Medicated and Controlled,,S/p lap band Dysphagia- can only swallow liquids Hx/o esophageal stricture with multiple dilations in the past   Endo/Other  diabetes, Well Controlled, Type 2, Insulin Dependent    Renal/GU Renal diseaseHx/o renal calculi  negative genitourinary   Musculoskeletal  (+) Arthritis ,    Abdominal   Peds  Hematology  (+) Blood dyscrasia (Plavix), anemia   Anesthesia Other Findings dysphagia- stricture  Reproductive/Obstetrics negative OB ROS                              Anesthesia Physical Anesthesia Plan  ASA: 3  Anesthesia Plan: MAC   Post-op Pain Management: Minimal or no pain anticipated   Induction: Intravenous  PONV Risk  Score and Plan: 3 and Treatment may vary due to age or medical condition and TIVA  Airway Management Planned: Natural Airway and Simple Face Mask  Additional Equipment: None  Intra-op Plan:   Post-operative Plan: Extubation in OR  Informed Consent: I have reviewed the patients History and Physical, chart, labs and discussed the procedure including the risks, benefits and alternatives for the proposed anesthesia with the patient or authorized representative who has indicated his/her understanding and acceptance.     Dental advisory given  Plan Discussed with: CRNA and Anesthesiologist  Anesthesia Plan Comments:         Anesthesia Quick Evaluation

## 2023-04-30 NOTE — Transfer of Care (Signed)
Immediate Anesthesia Transfer of Care Note  Patient: NASRI CASUCCI  Procedure(s) Performed: ESOPHAGOGASTRODUODENOSCOPY (EGD) WITH PROPOFOL GASTROINTESTINAL STENT REMOVAL Balloon dilation wire-guided SCLEROTHERAPY  Patient Location: PACU and Endoscopy Unit  Anesthesia Type:MAC  Level of Consciousness: awake, alert , oriented, and patient cooperative  Airway & Oxygen Therapy: Patient Spontanous Breathing and Patient connected to face mask oxygen  Post-op Assessment: Report given to RN, Post -op Vital signs reviewed and stable, and Patient moving all extremities  Post vital signs: stable  Last Vitals:  Vitals Value Taken Time  BP 131/77 04/30/23 1316  Temp    Pulse 68 04/30/23 1316  Resp 20 04/30/23 1316  SpO2 99 % 04/30/23 1316    Last Pain:  Vitals:   04/30/23 1316  TempSrc:   PainSc: 0-No pain         Complications: No notable events documented.

## 2023-04-30 NOTE — Op Note (Signed)
[facility] Patient Name: Hector Curry Procedure Date: 04/30/2023 MRN: [id number] Attending MD: Corliss Parish , MD, [id number] Date of Birth: May 27, 1961 CSN: [id number] Age: 62 Admit Type: Outpatient Procedure:                Upper GI endoscopy Indications:              Post-surgical anastomotic stenosis, For therapy of                            post-surgical anastomotic stenosis Providers:                Corliss Parish, MD, Margaree Mackintosh, RN,                            Harrington Challenger, Technician Referring MD:              Medicines:                Monitored Anesthesia Care Complications:            No immediate complications. Estimated Blood Loss:     Estimated blood loss was minimal. Procedure:                Pre-Anesthesia Assessment:                           - Prior to the procedure, a History and Physical                            was performed, and patient medications and                            allergies were reviewed. The patient's tolerance of                            previous anesthesia was also reviewed. The risks                            and benefits of the procedure and the sedation                            options and risks were discussed with the patient.                            All questions were answered, and informed consent                            was obtained. Prior Anticoagulants: The patient has                            taken Plavix (clopidogrel), last dose was 5 days                            prior to procedure. ASA Grade Assessment: III - A  patient with severe systemic disease. After                            reviewing the risks and benefits, the patient was                            deemed in satisfactory condition to undergo the                            procedure.                           After obtaining informed consent, the endoscope was                            passed  under direct vision. Throughout the                            procedure, the patient's blood pressure, pulse, and                            oxygen saturations were monitored continuously. The                            GIF-H190 ([id number]) Olympus endoscope was introduced                            through the mouth, and advanced to the second part                            of duodenum. The upper GI endoscopy was                            accomplished without difficulty. The patient                            tolerated the procedure. Scope In: Scope Out: Findings:      No gross lesions were noted in the entire esophagus.      One benign-appearing, intrinsic moderate (circumferential scarring or       stenosis; an endoscope may pass) stenosis was found 39 cm from the       incisors at the esophagogastric anastomosis. This stenosis measured 1 cm       (inner diameter) x 1 cm (in length). The stenosis was traversed with the       adult endoscope with gentle pressure. A TTS dilator was passed through       the scope. Dilation with a 06-05-11 mm and a 12-13.5-15 mm anastomotic       balloon dilator was performed up to a maximum of 15 mm. The dilation       site was examined and showed moderate mucosal disruption, moderate       improvement in luminal narrowing and no perforation. To decrease the       risk of recurrence, the stricture was successfully injected with 1 mL of       triamcinolone (40 mg/mL) for attempt  at decreasing risk of anastomotic       stricture recurrence.      A 4 cm hiatal hernia was present.      A medium amount of food (residue) was found in the gastric body.      No gross lesions were noted in the duodenal bulb, in the first portion       of the duodenum and in the second portion of the duodenum. Impression:               - No gross lesions in the entire esophagus.                           - Benign-appearing esophagogastric anastomosis                             stricture. Traversed with gentle pressure of the                            adult endoscope. Dilated up to 15 mm. Injected with                            triamcinolone to decrease risk of recurrence.                           - 4 cm hiatal hernia.                           - A medium amount of food (residue) in the stomach.                           - No gross lesions in the duodenal bulb, in the                            first portion of the duodenum and in the second                            portion of the duodenum. Moderate Sedation:      Not Applicable - Patient had care per Anesthesia. Recommendation:           - The patient will be observed post-procedure,                            until all discharge criteria are met.                           - Discharge patient to home.                           - Patient has a contact number available for                            emergencies. The signs and symptoms of potential                            delayed complications were discussed  with the                            patient. Return to normal activities tomorrow.                            Written discharge instructions were provided to the                            patient.                           - Dilation diet as per protocol.                           - Dilation diet as per protocol.                           - Restart Carafate 4 times daily for 1 week then                            twice daily for 3 weeks.                           - Continue twice daily PPI.                           - May restart Plavix on 9/7 AM to decrease risk of                            post interventional bleeding.                           - Observe patient's clinical course.                           - Will see about repeat endoscopy within the next 2                            to 3 weeks to try to keep this area stretched open.                            May be with myself or one of my  partners in the [facility].                           - The findings and recommendations were discussed                            with the patient.                           - The findings and recommendations were discussed                            with the patient's family. Procedure  Code(s):        --- Professional ---                           [phone], Esophagogastroduodenoscopy, flexible,                            transoral; with transendoscopic balloon dilation of                            esophagus (less than 30 mm diameter) Diagnosis Code(s):        --- Professional ---                           K22.2, Esophageal obstruction                           K44.9, Diaphragmatic hernia without obstruction or                            gangrene                           K91.89, Other postprocedural complications and                            disorders of digestive system CPT copyright 2022 [employer]. All rights reserved. The codes documented in this report are preliminary and upon coder review may  be revised to meet current compliance requirements. Corliss Parish, MD 04/30/2023 1:23:17 PM Number of Addenda: 0

## 2023-04-30 NOTE — H&P (Signed)
GASTROENTEROLOGY PROCEDURE H&P NOTE   Primary Care Physician: Marjo Bicker, MD  HPI: Hector Curry is a 62 y.o. male who presents for EGD for evaluation and dilation of esophagogastric anastomosis stricture (patient previously had AXIOS stent that was removed).  Past Medical History:  Diagnosis Date   Allergic rhinitis    Atrial tachycardia    CAD (coronary artery disease)    a. 05/08/2015 Cath: no significant CAD, LVEF nl-->Med; b. 07/2017 MV: attenuation artifact, no ischemia, EF 65%-->Low risk; c. 03/2021 NSTEMI/PCI: LM nl, LAD 80p (3.0x18 Onyx Frontier DES), D1 90 (too small for PCI), LCX nl, RCA nl.   Diabetes mellitus without complication (HCC)    Diastolic dysfunction    a. 04/2015 Echo: EF 60-65%; b. 05/2016 Echo: EF 60-65%, GrI DD; c. 07/2017 Echo: EF 55-60%, Ao root 42mm; d. 03/2019 Echo: EF 55-60%, Ao root/Asc Ao 42mm; e. 03/2021 Echo: EF 50-55%, apical HK, mod asymm basal-septal hypertrophy. Nl RV fxn. Asc Ao 44mm.   Dilated aortic root (HCC)    a. 07/2017 Echo: 42mm Ao root - mildly dil; b. 03/2019 Echo: Ao root 42mm; c. 03/2021 Echo: Asc Ao 44mm.   Diverticulosis 10/03/2014   Dysrhythmia    Esophageal stricture    a.  In setting of lap band June 2022-stricture at the GJ anastomosis requiring gastrostomy tube; b. 05/2021 s/p esoph stenting (15mm); c. 07/2021 s/p esoph stenting (20mm).   Family history of premature CAD    a. father passed from MI at 68   GERD (gastroesophageal reflux disease)    GIB (gastrointestinal bleeding)    a. 07/2021 following esoph stenting.   Hemorrhoids    a. internal hemorrhoids s/p surgery 1999   History of kidney stones    History of tobacco abuse    Hyperlipidemia    Hyperplastic colon polyp 10/03/2014   a. x 2    Hypertension    Inflammatory arthritis    a. CCP antibodies & x-rays negative. Rheumatoid factor 14, felt to be crystaline over RA or psoriatic   Iron deficiency anemia 02/05/2022   Morbid obesity (HCC)    a. s/p  LAP-BAND - complicated by esoph stricture.   Myocardial infarction [facility])    NSTEMI   Nausea and vomiting 04/11/2023   OSA (obstructive sleep apnea)    a. on CPAP   Osteoarthritis    PSVT (paroxysmal supraventricular tachycardia)    a. 48 hr Holter 04/2015: NSR w/ rare PVC, short runs of narrow complex tachycardiac, possible atrial tach, longest run 7 beats, PACs noted (2% of all beats 3600 total) they did not seem to correlate w/ significant arrythmia; b. 08/2017 Event monitor: no significant arrhythmias.   Past Surgical History:  Procedure Laterality Date   BALLOON DILATION N/A 08/14/2021   Procedure: BALLOON DILATION;  Surgeon: Mansouraty, Netty Starring., MD;  Location: [facility]OSCOPY;  Service: Gastroenterology;  Laterality: N/A;   BALLOON DILATION N/A 02/20/2022   Procedure: BALLOON DILATION;  Surgeon: Rachael Fee, MD;  Location: [facility]COPY;  Service: Gastroenterology;  Laterality: N/A;   BALLOON DILATION N/A 03/27/2022   Procedure: BALLOON DILATION;  Surgeon: Meridee Score Netty Starring., MD;  Location: [facility] ENDOSCOPY;  Service: Gastroenterology;  Laterality: N/A;   BARIATRIC SURGERY  01/2021   lap band    BILIARY STENT PLACEMENT N/A 08/14/2021   Procedure: AXIOS STENT PLACEMENT;  Surgeon: Lemar Lofty., MD;  Location: [facility] ENDOSCOPY;  Service: Gastroenterology;  Laterality: N/A;   BILIARY STENT PLACEMENT N/A 10/16/2022   Procedure: Francine Graven  PLACEMENT;  Surgeon: Meridee Score Netty Starring., MD;  Location: [facility];  Service: Gastroenterology;  Laterality: N/A;   BILIARY STENT PLACEMENT N/A 04/09/2023   Procedure: AXIOS STENT PLACEMENT;  Surgeon: Lemar Lofty., MD;  Location: [facility]OSCOPY;  Service: Gastroenterology;  Laterality: N/A;   BIOPSY  07/08/2021   Procedure: BIOPSY;  Surgeon: Meridee Score Netty Starring., MD;  Location: [facility]OSCOPY;  Service: Gastroenterology;;   BIOPSY  08/14/2021   Procedure: BIOPSY;  Surgeon: Lemar Lofty., MD;  Location: [facility];   Service: Gastroenterology;;   BIOPSY  10/16/2022   Procedure: BIOPSY;  Surgeon: Lemar Lofty., MD;  Location: [facility]OSCOPY;  Service: Gastroenterology;;   CARDIAC CATHETERIZATION N/A 05/08/2015   Procedure: Left Heart Cath and Coronary Angiography;  Surgeon: Corky Crafts, MD;  Location: [facility] INVASIVE CV LAB;  Service: Cardiovascular;  Laterality: N/A;   CARPAL TUNNEL RELEASE Bilateral    CHOLECYSTECTOMY     COLONOSCOPY  06/27/2005   COLONOSCOPY  10/03/2014   CORONARY STENT INTERVENTION N/A 04/15/2021   Procedure: CORONARY STENT INTERVENTION;  Surgeon: Runell Gess, MD;  Location: [facility] INVASIVE CV LAB;  Service: Cardiovascular;  Laterality: N/A;   DUODENAL STENT PLACEMENT N/A 11/18/2021   Procedure: DUODENAL STENT PLACEMENT;  Surgeon: Meridee Score Netty Starring., MD;  Location: [facility] ENDOS[facility];  Service: Gastroenterology;  Laterality: N/A;  axios placed at GJ anastamosis   DUODENAL STENT PLACEMENT  03/27/2022   Procedure: GASTRIC STENT PLACEMENT;  Surgeon: Meridee Score Netty Starring., MD;  Location: [facility] ENDOSCOPY;  Service: Gastroenterology;;   ESOPHAGEAL DILATION  05/27/2021   Procedure: ESOPHAGEAL DILATION;  Surgeon: Lemar Lofty., MD;  Location: [facility];  Service: Gastroenterology;;   ESOPHAGEAL DILATION  01/23/2022   Procedure: ESOPHAGEAL DILATION;  Surgeon: Lemar Lofty., MD;  Location: [facility] ENDOSCOPY;  Service: Gastroenterology;;   ESOPHAGEAL DILATION  04/09/2023   Procedure: ESOPHAGEAL DILATION;  Surgeon: Lemar Lofty., MD;  Location: [facility];  Service: Gastroenterology;;   ESOPHAGEAL STENT PLACEMENT N/A 05/27/2021   Procedure: ESOPHAGEAL STENT PLACEMENT;  Surgeon: Lemar Lofty., MD;  Location: [facility];  Service: Gastroenterology;  Laterality: N/A;   ESOPHAGOGASTRODUODENOSCOPY  06/27/2005   ESOPHAGOGASTRODUODENOSCOPY N/A 03/19/2021   Procedure: ESOPHAGOGASTRODUODENOSCOPY (EGD);  Surgeon: Sheliah Hatch, De Blanch, MD;  Location: [facility]  ENDOSCOPY;  Service: General;  Laterality: N/A;   ESOPHAGOGASTRODUODENOSCOPY N/A 08/16/2021   Procedure: ESOPHAGOGASTRODUODENOSCOPY (EGD);  Surgeon: Lemar Lofty., MD;  Location: [facility] ENDOSCOPY;  Service: Gastroenterology;  Laterality: N/A;   ESOPHAGOGASTRODUODENOSCOPY (EGD) WITH PROPOFOL N/A 05/27/2021   Procedure: ESOPHAGOGASTRODUODENOSCOPY (EGD) WITH PROPOFOL;  Surgeon: Meridee Score Netty Starring., MD;  Location: [facility] ENDOSCOPY;  Service: Gastroenterology;  Laterality: N/A;   ESOPHAGOGASTRODUODENOSCOPY (EGD) WITH PROPOFOL N/A 07/08/2021   Procedure: ESOPHAGOGASTRODUODENOSCOPY (EGD) WITH PROPOFOL;  Surgeon: Meridee Score Netty Starring., MD;  Location: [facility] ENDOSCOPY;  Service: Gastroenterology;  Laterality: N/A;  fluoro   ESOPHAGOGASTRODUODENOSCOPY (EGD) WITH PROPOFOL N/A 08/14/2021   Procedure: ESOPHAGOGASTRODUODENOSCOPY (EGD) WITH PROPOFOL;  Surgeon: Meridee Score Netty Starring., MD;  Location: [facility] ENDOSCOPY;  Service: Gastroenterology;  Laterality: N/A;  fluoro Axios stent (20 mm)   ESOPHAGOGASTRODUODENOSCOPY (EGD) WITH PROPOFOL N/A 10/16/2021   Procedure: ESOPHAGOGASTRODUODENOSCOPY (EGD) WITH PROPOFOL;  Surgeon: Meridee Score Netty Starring., MD;  Location: [facility];  Service: Gastroenterology;  Laterality: N/A;  axios stent pull fluoro   ESOPHAGOGASTRODUODENOSCOPY (EGD) WITH PROPOFOL N/A 11/18/2021   Procedure: ESOPHAGOGASTRODUODENOSCOPY (EGD) WITH PROPOFOL;  Surgeon: Meridee Score Netty Starring., MD;  Location: [facility];  Service: Gastroenterology;  Laterality: N/A;   ESOPHAGOGASTRODUODENOSCOPY (EGD) WITH PROPOFOL N/A 01/23/2022   Procedure: ESOPHAGOGASTRODUODENOSCOPY (EGD)  WITH PROPOFOL;  Surgeon: Mansouraty, Netty Starring., MD;  Location: [facility] ENDOSCOPY;  Service: Gastroenterology;  Laterality: N/A;   ESOPHAGOGASTRODUODENOSCOPY (EGD) WITH PROPOFOL N/A 02/20/2022   Procedure: ESOPHAGOGASTRODUODENOSCOPY (EGD) WITH PROPOFOL;  Surgeon: Rachael Fee, MD;  Location: [facility] END[facility];  Service: Gastroenterology;   Laterality: N/A;   ESOPHAGOGASTRODUODENOSCOPY (EGD) WITH PROPOFOL N/A 03/27/2022   Procedure: ESOPHAGOGASTRODUODENOSCOPY (EGD) WITH PROPOFOL;  Surgeon: Meridee Score Netty Starring., MD;  Location: [facility] ENDOSCOPY;  Service: Gastroenterology;  Laterality: N/A;   ESOPHAGOGASTRODUODENOSCOPY (EGD) WITH PROPOFOL N/A 08/14/2022   Procedure: ESOPHAGOGASTRODUODENOSCOPY (EGD) WITH PROPOFOL;  Surgeon: Meridee Score Netty Starring., MD;  Location: [facility];  Service: Gastroenterology;  Laterality: N/A;   ESOPHAGOGASTRODUODENOSCOPY (EGD) WITH PROPOFOL N/A 10/16/2022   Procedure: ESOPHAGOGASTRODUODENOSCOPY (EGD) WITH PROPOFOL;  Surgeon: Meridee Score Netty Starring., MD;  Location: [facility] END[facility];  Service: Gastroenterology;  Laterality: N/A;   ESOPHAGOGASTRODUODENOSCOPY (EGD) WITH PROPOFOL N/A 11/28/2022   Procedure: ESOPHAGOGASTRODUODENOSCOPY (EGD) WITH PROPOFOL WITH AXIOS STENT REMOVAL;  Surgeon: Meridee Score Netty Starring., MD;  Location: [facility] ENDOSCOPY;  Service: Gastroenterology;  Laterality: N/A;   ESOPHAGOGASTRODUODENOSCOPY (EGD) WITH PROPOFOL N/A 04/09/2023   Procedure: ESOPHAGOGASTRODUODENOSCOPY (EGD) WITH PROPOFOL;  Surgeon: Meridee Score Netty Starring., MD;  Location: [facility] END[facility];  Service: Gastroenterology;  Laterality: N/A;  dilation - axios stent   ESOPHAGOGASTRODUODENOSCOPY (EGD) WITH PROPOFOL N/A 04/11/2023   Procedure: ESOPHAGOGASTRODUODENOSCOPY (EGD) WITH PROPOFOL;  Surgeon: Beverley Fiedler, MD;  Location: [facility] ENDOSCOPY;  Service: Gastroenterology;  Laterality: N/A;   FLEXIBLE SIGMOIDOSCOPY N/A 08/16/2021   Procedure: FLEXIBLE SIGMOIDOSCOPY;  Surgeon: Meridee Score Netty Starring., MD;  Location: [facility] ENDOSCOPY;  Service: Gastroenterology;  Laterality: N/A;   FOREIGN BODY REMOVAL  02/20/2022   Procedure: FOREIGN BODY REMOVAL;  Surgeon: Rachael Fee, MD;  Location: [facility] ENDOSCOPY;  Service: Gastroenterology;;   FOREIGN BODY REMOVAL ESOPHAGEAL  03/27/2022   Procedure: REMOVAL FOREIGN BODY;  Surgeon: Meridee Score Netty Starring., MD;   Location: [facility] ENDOSCOPY;  Service: Gastroenterology;;   GASTRIC ROUX-EN-Y N/A 02/18/2021   Procedure: LAPAROSCOPIC ROUX-EN-Y GASTRIC BYPASS WITH UPPER ENDOSCOPY,;  Surgeon: Sheliah Hatch De Blanch, MD;  Location: [facility] ORS;  Service: General;  Laterality: N/A;   GASTRIC ROUX-EN-Y N/A 12/02/2022   Procedure: LAPAROSCOPIC converted to OPEN GASTRIC BYPASS REVERSAL, TAKEDOWN OF GASTROJEJUNAL ANASTOMOSIS, GASTRO GASTRIC ANASTOMOSIS WITH UPPER ENDOSCOPY 1. Laparoscopic lysis of adhesions 2. Partial gastrectomy 3. Small bowel resection 4. Creation of esophagus to gastric anastomosis 5. Creation of gastrostomy;  Surgeon: Kinsinger, De Blanch, MD;  Location: [facility] ORS;  Service: General;  Laterality:    GASTROINTESTINAL STENT REMOVAL  01/23/2022   Procedure: AXIOS STENT REMOVAL;  Surgeon: Lemar Lofty., MD;  Location: [facility] ENDOSCOPY;  Service: Gastroenterology;;   GASTROSTOMY N/A 12/02/2022   Procedure: INSERTION OF GASTROSTOMY TUBE;  Surgeon: Rodman Pickle, MD;  Location: [facility] ORS;  Service: General;  Laterality: N/A;   HEMORRHOID SURGERY  08/25/1997   IR GJ TUBE CHANGE  03/27/2021   LAPAROSCOPIC INSERTION GASTROSTOMY TUBE N/A 03/20/2021   Procedure: LAPAROSCOPIC INSERTION GASTROSTOMY TUBE;  Surgeon: Rodman Pickle, MD;  Location: [facility] ORS;  Service: General;  Laterality: N/A;   LEFT HEART CATH AND CORONARY ANGIOGRAPHY N/A 04/15/2021   Procedure: LEFT HEART CATH AND CORONARY ANGIOGRAPHY;  Surgeon: Runell Gess, MD;  Location: [facility] INVASIVE CV LAB;  Service: Cardiovascular;  Laterality: N/A;   MOUTH SURGERY     removed area which was benign   STENT REMOVAL  07/08/2021   Procedure: STENT REMOVAL;  Surgeon: Lemar Lofty., MD;  Location: [facility] ENDOSCOPY;  Service: Gastroenterology;;   Francine Graven REMOVAL  10/16/2021   Procedure: STENT REMOVAL;  Surgeon: Lemar Lofty., MD;  Location: [facility] ENDOSCOPY;  Service: Gastroenterology;;   Francine Graven REMOVAL  08/14/2022   Procedure: STENT REMOVAL;   Surgeon: Lemar Lofty., MD;  Location: [facility] ENDOSCOPY;  Service: Gastroenterology;;   Francine Graven REMOVAL  11/28/2022   Procedure: STENT REMOVAL;  Surgeon: Lemar Lofty., MD;  Location: [facility] ENDOSCOPY;  Service: Gastroenterology;;   Francine Graven REMOVAL  04/11/2023   Procedure: STENT REMOVAL;  Surgeon: Beverley Fiedler, MD;  Location: [facility] ENDOSCOPY;  Service: Gastroenterology;;   STERIOD INJECTION  01/23/2022   Procedure: STEROID INJECTION;  Surgeon: Lemar Lofty., MD;  Location: [facility] ENDOSCOPY;  Service: Gastroenterology;;   SUBMUCOSAL INJECTION  11/18/2021   Procedure: SUBMUCOSAL INJECTION;  Surgeon: Lemar Lofty., MD;  Location: [facility] ENDOSCOPY;  Service: Gastroenterology;;   SUBMUCOSAL INJECTION  08/14/2022   Procedure: SUBMUCOSAL INJECTION;  Surgeon: Lemar Lofty., MD;  Location: [facility] ENDOSCOPY;  Service: Gastroenterology;;   TONSILLECTOMY     UPPER GI ENDOSCOPY N/A 03/20/2021   Procedure: UPPER GI ENDOSCOPY;  Surgeon: Kinsinger, De Blanch, MD;  Location: [facility] ORS;  Service: General;  Laterality: N/A;   Current Facility-Administered Medications  Medication Dose Route Frequency Provider Last Rate Last Admin   0.9 %  sodium chloride infusion   Intravenous Continuous Mansouraty, Netty Starring., MD        Current Facility-Administered Medications:    0.9 %  sodium chloride infusion, , Intravenous, Continuous, Mansouraty, Netty Starring., MD Allergies  Allergen Reactions   Other Itching and Other (See Comments)    Cats Wheezing, runny nose/eyes, congestion    Family History  Problem Relation Age of Onset   Hypertension Mother    Diabetes Mother    Heart attack Father 67   CAD Father    Hypertension Sister    Diabetes Other    Hypertension Other    Heart disease Other    Colon cancer Neg Hx    Esophageal cancer Neg Hx    Pancreatic cancer Neg Hx    Stomach cancer Neg Hx    Liver disease Neg Hx    Inflammatory bowel disease Neg Hx    Rectal cancer Neg Hx     Social History   Socioeconomic History   Marital status: Married    Spouse name: Not on file   Number of children: 3   Years of education: Not on file   Highest education level: Not on file  Occupational History   Occupation: IT IT trainer  Tobacco Use   Smoking status: Former    Current packs/day: 0.00    Average packs/day: 1 pack/day for 15.0 years (15.0 ttl pk-yrs)    Types: Cigarettes    Start date: 08/05/1974    Quit date: 08/05/1989    Years since quitting: 33.7   Smokeless tobacco: Never  Vaping Use   Vaping status: Never Used  Substance and Sexual Activity   Alcohol use: Yes    Comment: occ   Drug use: Never   Sexual activity: Not Currently  Other Topics Concern   Not on file  Social History Narrative   Not on file   Social Determinants of Health   Financial Resource Strain: Not on file  Food Insecurity: No Food Insecurity (04/11/2023)   Hunger Vital Sign    Worried About Running Out of Food in the Last Year: Never true    Ran Out of Food in the Last Year: Never true  Transportation Needs: No Transportation Needs (  04/11/2023)   PRAPARE - Administrator, Civil Service (Medical): No    Lack of Transportation (Non-Medical): No  Physical Activity: Not on file  Stress: Not on file  Social Connections: Unknown (04/14/2023)   [facility]   Social Network    Social Network: Not on file  Intimate Partner Violence: Unknown (04/14/2023)   Received from [facility]   HITS    Physically Hurt: Not on file    Insult or Talk Down To: Not on file    Threaten Physical Harm: Not on file    Scream or Curse: Not on file    Physical Exam: Today's Vitals   04/30/23 1150  BP: 131/83  Pulse: 62  Resp: 14  Temp: 98.5 F (36.9 C)  TempSrc: Temporal  SpO2: 98%  Weight: 88.5 kg  Height: 6\' 2"  (1.88 m)  PainSc: 0-No pain   Body mass index is 25.04 kg/m. GEN: NAD EYE: Sclerae anicteric ENT: MMM CV: Non-tachycardic GI: Soft,  NT/ND NEURO:  Alert & Oriented x 3  Lab Results: No results for input(s): "WBC", "HGB", "HCT", "PLT" in the last 72 hours. BMET No results for input(s): "NA", "K", "CL", "CO2", "GLUCOSE", "BUN", "CREATININE", "CALCIUM" in the last 72 hours. LFT No results for input(s): "PROT", "ALBUMIN", "AST", "ALT", "ALKPHOS", "BILITOT", "BILIDIR", "IBILI" in the last 72 hours. PT/INR No results for input(s): "LABPROT", "INR" in the last 72 hours.   Impression / Plan: This is a 62 y.o.male who presents for EGD for evaluation and dilation of esophagogastric anastomosis stricture (patient previously had AXIOS stent that was removed).  The risks and benefits of endoscopic evaluation/treatment were discussed with the patient and/or family; these include but are not limited to the risk of perforation, infection, bleeding, missed lesions, lack of diagnosis, severe illness requiring hospitalization, as well as anesthesia and sedation related illnesses.  The patient's history has been reviewed, patient examined, no change in status, and deemed stable for procedure.  The patient and/or family is agreeable to proceed.    Hector Parish, MD [facility] Office # [phone]

## 2023-04-30 NOTE — Telephone Encounter (Signed)
The pt needs repeat EGD in the [facility] on 05/19/23 at 730 am if the [facility] agrees.   If not Dr Rhea Belton will accommodate on 9/24 at 8 am in the [facility].   Pt will need to hold plavix 5 days prior.    All information has been sent to the pt via My Chart.  He does view his messages    FYI Dr Meridee Score all take care of

## 2023-04-30 NOTE — Discharge Instructions (Signed)

## 2023-04-30 NOTE — Telephone Encounter (Signed)
Thanks [name]. GM 

## 2023-04-30 NOTE — Anesthesia Postprocedure Evaluation (Signed)
Anesthesia Post Note  Patient: SUMTER GOZMAN  Procedure(s) Performed: ESOPHAGOGASTRODUODENOSCOPY (EGD) WITH PROPOFOL GASTROINTESTINAL STENT REMOVAL Balloon dilation wire-guided SCLEROTHERAPY     Patient location during evaluation: PACU Anesthesia Type: MAC Level of consciousness: awake and alert Pain management: pain level controlled Vital Signs Assessment: post-procedure vital signs reviewed and stable Respiratory status: spontaneous breathing, nonlabored ventilation, respiratory function stable and patient connected to nasal cannula oxygen Cardiovascular status: stable and blood pressure returned to baseline Postop Assessment: no apparent nausea or vomiting Anesthetic complications: no   No notable events documented.  Last Vitals:  Vitals:   04/30/23 1330 04/30/23 1340  BP: (!) 142/94 (!) 149/90  Pulse: 77 73  Resp: (!) 23 16  Temp:    SpO2: 93%     Last Pain:  Vitals:   04/30/23 1340  TempSrc:   PainSc: 0-No pain                 [facility]

## 2023-05-04 ENCOUNTER — Encounter (HOSPITAL_COMMUNITY): Payer: Self-pay | Admitting: Gastroenterology

## 2023-05-07 ENCOUNTER — Other Ambulatory Visit (HOSPITAL_COMMUNITY): Payer: Self-pay

## 2023-05-07 ENCOUNTER — Other Ambulatory Visit: Payer: Self-pay

## 2023-05-07 MED ORDER — PROPRANOLOL HCL 20 MG PO TABS
20.0000 mg | ORAL_TABLET | Freq: Three times a day (TID) | ORAL | 11 refills | Status: DC
  Filled 2023-05-07: qty 90, 30d supply, fill #0
  Filled 2023-06-22: qty 90, 30d supply, fill #1
  Filled 2023-07-31: qty 90, 30d supply, fill #2
  Filled 2023-10-05: qty 90, 30d supply, fill #3
  Filled 2023-11-25: qty 90, 30d supply, fill #4
  Filled 2023-12-23: qty 90, 30d supply, fill #5
  Filled 2024-01-19 – 2024-01-20 (×3): qty 90, 30d supply, fill #6
  Filled 2024-02-08: qty 90, 30d supply, fill #7
  Filled 2024-03-08: qty 90, 30d supply, fill #8
  Filled 2024-04-06 – 2024-04-07 (×5): qty 90, 30d supply, fill #9
  Filled 2024-05-04 – 2024-05-09 (×2): qty 90, 30d supply, fill #10
  Filled ????-??-??: fill #9

## 2023-05-11 ENCOUNTER — Other Ambulatory Visit (HOSPITAL_COMMUNITY): Payer: Self-pay

## 2023-05-12 ENCOUNTER — Inpatient Hospital Stay: Payer: Managed Care, Other (non HMO) | Attending: Hematology

## 2023-05-12 DIAGNOSIS — Z87891 Personal history of nicotine dependence: Secondary | ICD-10-CM | POA: Diagnosis not present

## 2023-05-12 DIAGNOSIS — D649 Anemia, unspecified: Secondary | ICD-10-CM

## 2023-05-12 DIAGNOSIS — D509 Iron deficiency anemia, unspecified: Secondary | ICD-10-CM | POA: Insufficient documentation

## 2023-05-12 DIAGNOSIS — D508 Other iron deficiency anemias: Secondary | ICD-10-CM

## 2023-05-12 LAB — CBC WITH DIFFERENTIAL/PLATELET
Abs Immature Granulocytes: 0.05 10*3/uL (ref 0.00–0.07)
Basophils Absolute: 0.1 10*3/uL (ref 0.0–0.1)
Basophils Relative: 1 %
Eosinophils Absolute: 0.1 10*3/uL (ref 0.0–0.5)
Eosinophils Relative: 2 %
HCT: 47.8 % (ref 39.0–52.0)
Hemoglobin: 15.5 g/dL (ref 13.0–17.0)
Immature Granulocytes: 1 %
Lymphocytes Relative: 16 %
Lymphs Abs: 1.3 10*3/uL (ref 0.7–4.0)
MCH: 29.6 pg (ref 26.0–34.0)
MCHC: 32.4 g/dL (ref 30.0–36.0)
MCV: 91.4 fL (ref 80.0–100.0)
Monocytes Absolute: 0.6 10*3/uL (ref 0.1–1.0)
Monocytes Relative: 7 %
Neutro Abs: 6 10*3/uL (ref 1.7–7.7)
Neutrophils Relative %: 73 %
Platelets: 165 10*3/uL (ref 150–400)
RBC: 5.23 MIL/uL (ref 4.22–5.81)
RDW: 14.6 % (ref 11.5–15.5)
WBC: 8.1 10*3/uL (ref 4.0–10.5)
nRBC: 0 % (ref 0.0–0.2)

## 2023-05-12 LAB — IRON AND TIBC
Iron: 96 ug/dL (ref 45–182)
Saturation Ratios: 27 % (ref 17.9–39.5)
TIBC: 353 ug/dL (ref 250–450)
UIBC: 257 ug/dL

## 2023-05-12 LAB — FERRITIN: Ferritin: 57 ng/mL (ref 24–336)

## 2023-05-16 NOTE — Progress Notes (Unsigned)
[facility] [address], [place] [address]   CLINIC:  Medical Oncology/Hematology  PCP:  Marjo Bicker, MD [address] [place] [address] [phone]   REASON FOR VISIT:  Follow-up for iron deficiency anemia   PRIOR THERAPY: Oral iron tablets   CURRENT THERAPY: Intermittent IV iron (last given June/July 2023)  INTERVAL HISTORY:   Hector Curry 62 y.o. male returns for routine follow-up of  iron deficiency anemia.  He was last seen by Rojelio Brenner PA-C on 07/11/2022.  Since his last visit, he was hospitalized in April 2024 for partial gastrectomy and esophagectomy with EG anastomosis, G-tube, and small bowel resection.  He was hospitalized again in August 2024 due to intractable nausea following esophageal stent placement.  At today's visit, he reports feeling fair.  He is still working to get his appetite and taste back after his multiple GI procedures.  He has some ongoing fatigue that he attributes to his difficulty eating.  He denies any bright red blood per rectum or melena.   No pica, restless legs, headaches, chest pain, dyspnea on exertion, lightheadedness, or syncope.  He has chronic palpitations, on propranolol.   He has 45% energy and 45% appetite. He endorses that he is maintaining a stable weight.  ASSESSMENT & PLAN:  1.   Iron deficiency anemia - Seen  at the request of his primary care provider (Dr. Aram Beecham) for evaluation and treatment of iron deficiency anemia with IV iron infusions requested. 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  During hospitalization, Hgb dropped to 7.8, transfused 1 unit PRBC and IV iron (Ferrlecit 250 mg) - S/p partial gastrectomy and  esophagectomy with EG anastomosis, G-tube, and small bowel resection in April 2024 - Most recent EGD (04/30/2023): Esophagogastric anastomosis stricture.  4 cm hiatal hernia.  No bleeding noted. - Hematology work-up (02/05/2022): Normal B12, folate, copper, methylmalonic acid, homocystine. Normal LDH, reticulocytes, SPEP. - He has been taking ferrous sulfate 325 mg twice daily since last year.  Currently taking Plavix, Protonix, and Carafate. - Received IV Venofer 300 mg x 3 from 02/05/2022 through 02/26/2022 - Most recent labs (05/12/2023): Hgb 15.5, ferritin 57, iron saturation 27 % - Patient denies any recent hematemesis, hematochezia, or melena. 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 - PLAN: No IV iron needed at this time.  Recommend restarting daily iron supplement (ferrous bisglycinate). - Will check labs only in 3 months. - Repeat CBC/iron panel and RTC in 6 months - Patient can continue to take oral iron supplement ONCE daily dosing, at least 2 hours separate from his PPI and Carafate   2.  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  He lives at home with his wife.  He has a remote history of tobacco use, smoked 1 PPD cigarettes for 9 years, but quit in 1991.  He denies any alcohol or illicit drug  use. -- FAMILY:  Maternal grandfather with pancreatic cancer.  Patient's sister also has iron deficiency anemia secondary to history of gastric bypass surgery    PLAN SUMMARY: >> Labs only in 3 months = CBC/D, ferritin, iron/TIBC >> Labs in 6 months = CBC/D,  ferritin, iron/TIBC, B12, MMA, folate >> OFFICE visit in 6 months (1 week after labs)     REVIEW OF SYSTEMS:   Review of Systems  Constitutional:  Positive for fatigue. Negative for appetite change, chills, diaphoresis, fever and unexpected weight change.  HENT:   Negative for lump/mass and nosebleeds.   Eyes:  Negative for eye problems.  Respiratory:  Negative for cough, hemoptysis and shortness of breath.   Cardiovascular:  Negative for chest pain, leg swelling and palpitations.  Gastrointestinal:  Positive for constipation and nausea. Negative for abdominal pain, blood in stool, diarrhea and vomiting.  Genitourinary:  Negative for hematuria.   Skin: Negative.   Neurological:  Negative for dizziness, headaches and light-headedness.  Hematological:  Does not bruise/bleed easily.     PHYSICAL EXAM:  ECOG PERFORMANCE STATUS: 1 - Symptomatic but completely ambulatory  There were no vitals filed for this visit. There were no vitals filed for this visit. Physical Exam Constitutional:      Appearance: Normal appearance. He is normal weight.  Cardiovascular:     Heart sounds: Normal heart sounds.  Pulmonary:     Breath sounds: Normal breath sounds. Decreased air movement present.  Neurological:     General: No focal deficit present.     Mental Status: Mental status is at baseline.  Psychiatric:        Behavior: Behavior normal. Behavior is cooperative.     PAST MEDICAL/SURGICAL HISTORY:  Past Medical History:  Diagnosis Date   Allergic rhinitis    Atrial tachycardia    CAD (coronary artery disease)    a. 05/08/2015 Cath: no significant CAD, LVEF nl-->Med; b. 07/2017 MV: attenuation artifact, no ischemia, EF 65%-->Low risk; c. 03/2021 NSTEMI/PCI: LM nl, LAD 80p (3.0x18 Onyx Frontier DES), D1 90 (too small for PCI), LCX nl, RCA nl.   Diabetes mellitus without complication (HCC)    Diastolic dysfunction    a. 04/2015 Echo: EF 60-65%; b. 05/2016 Echo: EF 60-65%, GrI DD; c. 07/2017  Echo: EF 55-60%, Ao root 42mm; d. 03/2019 Echo: EF 55-60%, Ao root/Asc Ao 42mm; e. 03/2021 Echo: EF 50-55%, apical HK, mod asymm basal-septal hypertrophy. Nl RV fxn. Asc Ao 44mm.   Dilated aortic root (HCC)    a. 07/2017 Echo: 42mm Ao root - mildly dil; b. 03/2019 Echo: Ao root 42mm; c. 03/2021 Echo: Asc Ao 44mm.   Diverticulosis 10/03/2014   Dysrhythmia    Esophageal stricture    a.  In setting of lap band June 2022-stricture at the GJ anastomosis requiring gastrostomy tube; b. 05/2021 s/p esoph stenting (15mm); c. 07/2021 s/p esoph stenting (20mm).   Family history of premature CAD    a. father passed from MI at 34   GERD (gastroesophageal reflux disease)    GIB (gastrointestinal bleeding)    a. 07/2021 following esoph stenting.   Hemorrhoids    a. internal hemorrhoids s/p surgery 1999   History of kidney stones    History of tobacco abuse    Hyperlipidemia    Hyperplastic colon polyp 10/03/2014   a. x 2    Hypertension    Inflammatory arthritis    a. CCP antibodies & x-rays negative. Rheumatoid factor 14, felt to be crystaline over RA or  psoriatic   Iron deficiency anemia 02/05/2022   Morbid obesity (HCC)    a. s/p LAP-BAND - complicated by esoph stricture.   Myocardial infarction [facility])    NSTEMI   Nausea and vomiting 04/11/2023   OSA (obstructive sleep apnea)    a. on CPAP   Osteoarthritis    PSVT (paroxysmal supraventricular tachycardia)    a. 48 hr Holter 04/2015: NSR w/ rare PVC, short runs of narrow complex tachycardiac, possible atrial tach, longest run 7 beats, PACs noted (2% of all beats 3600 total) they did not seem to correlate w/ significant arrythmia; b. 08/2017 Event monitor: no significant arrhythmias.   Past Surgical History:  Procedure Laterality Date   BALLOON DILATION N/A 08/14/2021   Procedure: BALLOON DILATION;  Surgeon: Mansouraty, Netty Starring., MD;  Location: [facility] ENDOSCOPY;  Service: Gastroenterology;  Laterality: N/A;   BALLOON DILATION N/A 02/20/2022    Procedure: BALLOON DILATION;  Surgeon: Rachael Fee, MD;  Location: [facility]OSCOPY;  Service: Gastroenterology;  Laterality: N/A;   BALLOON DILATION N/A 03/27/2022   Procedure: BALLOON DILATION;  Surgeon: Meridee Score Netty Starring., MD;  Location: [facility] ENDOSCOPY;  Service: Gastroenterology;  Laterality: N/A;   BARIATRIC SURGERY  01/2021   lap band    BILIARY STENT PLACEMENT N/A 08/14/2021   Procedure: AXIOS STENT PLACEMENT;  Surgeon: Lemar Lofty., MD;  Location: [facility] ENDOSCOPY;  Service: Gastroenterology;  Laterality: N/A;   BILIARY STENT PLACEMENT N/A 10/16/2022   Procedure: STENT PLACEMENT;  Surgeon: Lemar Lofty., MD;  Location: [facility]COPY;  Service: Gastroenterology;  Laterality: N/A;   BILIARY STENT PLACEMENT N/A 04/09/2023   Procedure: AXIOS STENT PLACEMENT;  Surgeon: Lemar Lofty., MD;  Location: [facility]COPY;  Service: Gastroenterology;  Laterality: N/A;   BIOPSY  07/08/2021   Procedure: BIOPSY;  Surgeon: Meridee Score Netty Starring., MD;  Location: [facility]OSCOPY;  Service: Gastroenterology;;   BIOPSY  08/14/2021   Procedure: BIOPSY;  Surgeon: Lemar Lofty., MD;  Location: [facility];  Service: Gastroenterology;;   BIOPSY  10/16/2022   Procedure: BIOPSY;  Surgeon: Lemar Lofty., MD;  Location: [facility];  Service: Gastroenterology;;   CARDIAC CATHETERIZATION N/A 05/08/2015   Procedure: Left Heart Cath and Coronary Angiography;  Surgeon: Corky Crafts, MD;  Location: [facility] INVASIVE CV LAB;  Service: Cardiovascular;  Laterality: N/A;   CARPAL TUNNEL RELEASE Bilateral    CHOLECYSTECTOMY     COLONOSCOPY  06/27/2005   COLONOSCOPY  10/03/2014   CORONARY STENT INTERVENTION N/A 04/15/2021   Procedure: CORONARY STENT INTERVENTION;  Surgeon: Runell Gess, MD;  Location: [facility] INVASIVE CV LAB;  Service: Cardiovascular;  Laterality: N/A;   DUODENAL STENT PLACEMENT N/A 11/18/2021   Procedure: DUODENAL STENT PLACEMENT;  Surgeon: Meridee Score  Netty Starring., MD;  Location: [facility] ENDOSCOPY;  Service: Gastroenterology;  Laterality: N/A;  axios placed at GJ anastamosis   DUODENAL STENT PLACEMENT  03/27/2022   Procedure: GASTRIC STENT PLACEMENT;  Surgeon: Meridee Score Netty Starring., MD;  Location: [facility] ENDOSCOPY;  Service: Gastroenterology;;   ESOPHAGEAL DILATION  05/27/2021   Procedure: ESOPHAGEAL DILATION;  Surgeon: Lemar Lofty., MD;  Location: [facility];  Service: Gastroenterology;;   ESOPHAGEAL DILATION  01/23/2022   Procedure: ESOPHAGEAL DILATION;  Surgeon: Lemar Lofty., MD;  Location: [facility] ENDOSCOPY;  Service: Gastroenterology;;   ESOPHAGEAL DILATION  04/09/2023   Procedure: ESOPHAGEAL DILATION;  Surgeon: Lemar Lofty., MD;  Location: [facility];  Service: Gastroenterology;;   ESOPHAGEAL STENT PLACEMENT N/A 05/27/2021   Procedure: ESOPHAGEAL STENT PLACEMENT;  Surgeon: Corliss Parish  Montez Hageman., MD;  Location: [facility]COPY;  Service: Gastroenterology;  Laterality: N/A;   ESOPHAGOGASTRODUODENOSCOPY  06/27/2005   ESOPHAGOGASTRODUODENOSCOPY N/A 03/19/2021   Procedure: ESOPHAGOGASTRODUODENOSCOPY (EGD);  Surgeon: Sheliah Hatch, De Blanch, MD;  Location: [facility]OSCOPY;  Service: General;  Laterality: N/A;   ESOPHAGOGASTRODUODENOSCOPY N/A 08/16/2021   Procedure: ESOPHAGOGASTRODUODENOSCOPY (EGD);  Surgeon: Lemar Lofty., MD;  Location: [facility] ENDOSCOPY;  Service: Gastroenterology;  Laterality: N/A;   ESOPHAGOGASTRODUODENOSCOPY (EGD) WITH PROPOFOL N/A 05/27/2021   Procedure: ESOPHAGOGASTRODUODENOSCOPY (EGD) WITH PROPOFOL;  Surgeon: Meridee Score Netty Starring., MD;  Location: [facility] ENDOSCOPY;  Service: Gastroenterology;  Laterality: N/A;   ESOPHAGOGASTRODUODENOSCOPY (EGD) WITH PROPOFOL N/A 07/08/2021   Procedure: ESOPHAGOGASTRODUODENOSCOPY (EGD) WITH PROPOFOL;  Surgeon: Meridee Score Netty Starring., MD;  Location: [facility] ENDOSCOPY;  Service: Gastroenterology;  Laterality: N/A;  fluoro   ESOPHAGOGASTRODUODENOSCOPY (EGD) WITH PROPOFOL N/A  08/14/2021   Procedure: ESOPHAGOGASTRODUODENOSCOPY (EGD) WITH PROPOFOL;  Surgeon: Meridee Score Netty Starring., MD;  Location: [facility] ENDOS[facility];  Service: Gastroenterology;  Laterality: N/A;  fluoro Axios stent (20 mm)   ESOPHAGOGASTRODUODENOSCOPY (EGD) WITH PROPOFOL N/A 10/16/2021   Procedure: ESOPHAGOGASTRODUODENOSCOPY (EGD) WITH PROPOFOL;  Surgeon: Meridee Score Netty Starring., MD;  Location: [facility];  Service: Gastroenterology;  Laterality: N/A;  axios stent pull fluoro   ESOPHAGOGASTRODUODENOSCOPY (EGD) WITH PROPOFOL N/A 11/18/2021   Procedure: ESOPHAGOGASTRODUODENOSCOPY (EGD) WITH PROPOFOL;  Surgeon: Meridee Score Netty Starring., MD;  Location: [facility] ENDOS[facility];  Service: Gastroenterology;  Laterality: N/A;   ESOPHAGOGASTRODUODENOSCOPY (EGD) WITH PROPOFOL N/A 01/23/2022   Procedure: ESOPHAGOGASTRODUODENOSCOPY (EGD) WITH PROPOFOL;  Surgeon: Meridee Score Netty Starring., MD;  Location: [facility] ENDOSCOPY;  Service: Gastroenterology;  Laterality: N/A;   ESOPHAGOGASTRODUODENOSCOPY (EGD) WITH PROPOFOL N/A 02/20/2022   Procedure: ESOPHAGOGASTRODUODENOSCOPY (EGD) WITH PROPOFOL;  Surgeon: Rachael Fee, MD;  Location: [facility] END[facility];  Service: Gastroenterology;  Laterality: N/A;   ESOPHAGOGASTRODUODENOSCOPY (EGD) WITH PROPOFOL N/A 03/27/2022   Procedure: ESOPHAGOGASTRODUODENOSCOPY (EGD) WITH PROPOFOL;  Surgeon: Meridee Score Netty Starring., MD;  Location: [facility] ENDOSCOPY;  Service: Gastroenterology;  Laterality: N/A;   ESOPHAGOGASTRODUODENOSCOPY (EGD) WITH PROPOFOL N/A 08/14/2022   Procedure: ESOPHAGOGASTRODUODENOSCOPY (EGD) WITH PROPOFOL;  Surgeon: Meridee Score Netty Starring., MD;  Location: [facility];  Service: Gastroenterology;  Laterality: N/A;   ESOPHAGOGASTRODUODENOSCOPY (EGD) WITH PROPOFOL N/A 10/16/2022   Procedure: ESOPHAGOGASTRODUODENOSCOPY (EGD) WITH PROPOFOL;  Surgeon: Meridee Score Netty Starring., MD;  Location: [facility] ENDOS[facility];  Service: Gastroenterology;  Laterality: N/A;   ESOPHAGOGASTRODUODENOSCOPY (EGD) WITH PROPOFOL N/A  11/28/2022   Procedure: ESOPHAGOGASTRODUODENOSCOPY (EGD) WITH PROPOFOL WITH AXIOS STENT REMOVAL;  Surgeon: Meridee Score Netty Starring., MD;  Location: [facility] ENDOSCOPY;  Service: Gastroenterology;  Laterality: N/A;   ESOPHAGOGASTRODUODENOSCOPY (EGD) WITH PROPOFOL N/A 04/09/2023   Procedure: ESOPHAGOGASTRODUODENOSCOPY (EGD) WITH PROPOFOL;  Surgeon: Meridee Score Netty Starring., MD;  Location: [facility];  Service: Gastroenterology;  Laterality: N/A;  dilation - axios stent   ESOPHAGOGASTRODUODENOSCOPY (EGD) WITH PROPOFOL N/A 04/11/2023   Procedure: ESOPHAGOGASTRODUODENOSCOPY (EGD) WITH PROPOFOL;  Surgeon: Beverley Fiedler, MD;  Location: [facility] END[facility];  Service: Gastroenterology;  Laterality: N/A;   ESOPHAGOGASTRODUODENOSCOPY (EGD) WITH PROPOFOL N/A 04/30/2023   Procedure: ESOPHAGOGASTRODUODENOSCOPY (EGD) WITH PROPOFOL;  Surgeon: Meridee Score Netty Starring., MD;  Location: [facility] END[facility];  Service: Gastroenterology;  Laterality: N/A;  axios stent pull   FLEXIBLE SIGMOIDOSCOPY N/A 08/16/2021   Procedure: FLEXIBLE SIGMOIDOSCOPY;  Surgeon: Meridee Score Netty Starring., MD;  Location: [facility] ENDOSCOPY;  Service: Gastroenterology;  Laterality: N/A;   FOREIGN BODY REMOVAL  02/20/2022   Procedure: FOREIGN BODY REMOVAL;  Surgeon: Rachael Fee, MD;  Location: [facility] ENDOSCOPY;  Service: Gastroenterology;;   FOREIGN BODY REMOVAL ESOPHAGEAL  03/27/2022   Procedure: REMOVAL FOREIGN BODY;  Surgeon:  Mansouraty, Netty Starring., MD;  Location: [facility] ENDOSCOPY;  Service: Gastroenterology;;   GASTRIC ROUX-EN-Y N/A 02/18/2021   Procedure: LAPAROSCOPIC ROUX-EN-Y GASTRIC BYPASS WITH UPPER ENDOSCOPY,;  Surgeon: Sheliah Hatch De Blanch, MD;  Location: [facility] ORS;  Service: General;  Laterality: N/A;   GASTRIC ROUX-EN-Y N/A 12/02/2022   Procedure: LAPAROSCOPIC converted to OPEN GASTRIC BYPASS REVERSAL, TAKEDOWN OF GASTROJEJUNAL ANASTOMOSIS, GASTRO GASTRIC ANASTOMOSIS WITH UPPER ENDOSCOPY 1. Laparoscopic lysis of adhesions 2. Partial gastrectomy 3. Small bowel resection 4.  Creation of esophagus to gastric anastomosis 5. Creation of gastrostomy;  Surgeon: Kinsinger, De Blanch, MD;  Location: [facility] ORS;  Service: General;  Laterality:    GASTROINTESTINAL STENT REMOVAL  01/23/2022   Procedure: Christy Sartorius REMOVAL;  Surgeon: Lemar Lofty., MD;  Location: [facility] ENDOSCOPY;  Service: Gastroenterology;;   GASTROINTESTINAL STENT REMOVAL N/A 04/30/2023   Procedure: GASTROINTESTINAL STENT REMOVAL;  Surgeon: Lemar Lofty., MD;  Location: [facility] END[facility];  Service: Gastroenterology;  Laterality: N/A;   GASTROSTOMY N/A 12/02/2022   Procedure: INSERTION OF GASTROSTOMY TUBE;  Surgeon: Sheliah Hatch De Blanch, MD;  Location: [facility] ORS;  Service: General;  Laterality: N/A;   HEMORRHOID SURGERY  08/25/1997   IR GJ TUBE CHANGE  03/27/2021   LAPAROSCOPIC INSERTION GASTROSTOMY TUBE N/A 03/20/2021   Procedure: LAPAROSCOPIC INSERTION GASTROSTOMY TUBE;  Surgeon: Rodman Pickle, MD;  Location: [facility] OR[facility];  Service: General;  Laterality: N/A;   LEFT HEART CATH AND CORONARY ANGIOGRAPHY N/A 04/15/2021   Procedure: LEFT HEART CATH AND CORONARY ANGIOGRAPHY;  Surgeon: Runell Gess, MD;  Location: [facility] INVASIVE CV LAB;  Service: Cardiovascular;  Laterality: N/A;   MOUTH SURGERY     removed area which was benign   SCLEROTHERAPY  04/30/2023   Procedure: SCLEROTHERAPY;  Surgeon: Mansouraty, Netty Starring., MD;  Location: [facility] ENDOSCOPY;  Service: Gastroenterology;;   Francine Graven REMOVAL  07/08/2021   Procedure: STENT REMOVAL;  Surgeon: Lemar Lofty., MD;  Location: [facility] ENDOSCOPY;  Service: Gastroenterology;;   Francine Graven REMOVAL  10/16/2021   Procedure: STENT REMOVAL;  Surgeon: Lemar Lofty., MD;  Location: [facility]COPY;  Service: Gastroenterology;;   Francine Graven REMOVAL  08/14/2022   Procedure: STENT REMOVAL;  Surgeon: Lemar Lofty., MD;  Location: [facility];  Service: Gastroenterology;;   Francine Graven REMOVAL  11/28/2022   Procedure: STENT REMOVAL;  Surgeon: Lemar Lofty., MD;   Location: [facility] ENDOSCOPY;  Service: Gastroenterology;;   Francine Graven REMOVAL  04/11/2023   Procedure: STENT REMOVAL;  Surgeon: Beverley Fiedler, MD;  Location: [facility] ENDOSCOPY;  Service: Gastroenterology;;   STERIOD INJECTION  01/23/2022   Procedure: STEROID INJECTION;  Surgeon: Lemar Lofty., MD;  Location: [facility] ENDOSCOPY;  Service: Gastroenterology;;   SUBMUCOSAL INJECTION  11/18/2021   Procedure: SUBMUCOSAL INJECTION;  Surgeon: Lemar Lofty., MD;  Location: [facility];  Service: Gastroenterology;;   SUBMUCOSAL INJECTION  08/14/2022   Procedure: SUBMUCOSAL INJECTION;  Surgeon: Lemar Lofty., MD;  Location: [facility]COPY;  Service: Gastroenterology;;   TONSILLECTOMY     UPPER GI ENDOSCOPY N/A 03/20/2021   Procedure: UPPER GI ENDOSCOPY;  Surgeon: Kinsinger, De Blanch, MD;  Location: [facility];  Service: General;  Laterality: N/A;    SOCIAL HISTORY:  Social History   Socioeconomic History   Marital status: Married    Spouse name: Not on file   Number of children: 3   Years of education: Not on file   Highest education level: Not on file  Occupational History   Occupation: IT IT trainer  Tobacco Use   Smoking status: Former  Current packs/day: 0.00    Average packs/day: 1 pack/day for 15.0 years (15.0 ttl pk-yrs)    Types: Cigarettes    Start date: 08/05/1974    Quit date: 08/05/1989    Years since quitting: 33.8   Smokeless tobacco: Never  Vaping Use   Vaping status: Never Used  Substance and Sexual Activity   Alcohol use: Yes    Comment: occ   Drug use: Never   Sexual activity: Not Currently  Other Topics Concern   Not on file  Social History Narrative   Not on file   Social Determinants of Health   Financial Resource Strain: Not on file  Food Insecurity: No Food Insecurity (04/11/2023)   Hunger Vital Sign    Worried About Running Out of Food in the Last Year: Never true    Ran Out of Food in the Last Year: Never true  Transportation Needs: No  Transportation Needs (04/11/2023)   PRAPARE - Administrator, Civil Service (Medical): No    Lack of Transportation (Non-Medical): No  Physical Activity: Not on file  Stress: Not on file  Social Connections: Unknown (04/14/2023)   [facility]   Social Network    Social Network: Not on file  Intimate Partner Violence: Unknown (04/14/2023)   Received from [facility]   HITS    Physically Hurt: Not on file    Insult or Talk Down To: Not on file    Threaten Physical Harm: Not on file    Scream or Curse: Not on file    FAMILY HISTORY:  Family History  Problem Relation Age of Onset   Hypertension Mother    Diabetes Mother    Heart attack Father 79   CAD Father    Hypertension Sister    Diabetes Other    Hypertension Other    Heart disease Other    Colon cancer Neg Hx    Esophageal cancer Neg Hx    Pancreatic cancer Neg Hx    Stomach cancer Neg Hx    Liver disease Neg Hx    Inflammatory bowel disease Neg Hx    Rectal cancer Neg Hx     CURRENT MEDICATIONS:  Outpatient Encounter Medications as of 05/18/2023  Medication Sig Note   atorvastatin (LIPITOR) 10 MG tablet Take 1 tablet (10 mg total) by mouth daily.    clopidogrel (PLAVIX) 75 MG tablet Take 1 tablet (75 mg total) by mouth daily. 04/11/2023: ON HOLD   Continuous Glucose Sensor (FREESTYLE LIBRE 14 DAY SENSOR) MISC Insert 1 sensor to the back of the arm as directed every 14 days for continuous glucose monitoring    fluticasone (FLONASE) 50 MCG/ACT nasal spray Place 2-3 sprays into both nostrils daily as needed for allergies or rhinitis.    insulin lispro (HUMALOG) 100 UNIT/ML KwikPen Inject subcutaneously 3 (three) times daily with meals Per sliding scale (Patient taking differently: Inject 3-7 Units into the skin daily as needed (For high blood sugar).)    Insulin Pen Needle (UNIFINE PENTIPS) 32G X 4 MM MISC Use as needed as directed    mometasone (ELOCON) 0.1 % lotion Apply to affected areas once  daily (Patient taking differently: Apply 1 Application topically as needed (psoriasis).)    Multiple Vitamin (MULTI-VITAMIN DAILY PO) Take 1 patch by mouth daily.    nitroGLYCERIN (NITROSTAT) 0.4 MG SL tablet Place 1 tablet (0.4 mg total) under the tongue every 5 (five) minutes as needed for chest pain.    ondansetron (  ZOFRAN-ODT) 4 MG disintegrating tablet Take 1 tablet (4 mg total) by mouth every 6 (six) hours as needed for nausea or vomiting.    OVER THE COUNTER MEDICATION Apply 1 Application topically daily at 6 (six) AM. Medication: Bariatric patch 04/11/2023: Patient verified he applies every day    pantoprazole (PROTONIX) 40 MG tablet Take 1 tablet (40 mg total) by mouth 2 (two) times daily before a meal.    prochlorperazine (COMPAZINE) 25 MG suppository Place 1 suppository (25 mg total) rectally every 12 (twelve) hours as needed for nausea or vomiting.    prochlorperazine (COMPAZINE) 5 MG tablet Take 1 tablet (5 mg total) by mouth every 6 (six) hours as needed for nausea or vomiting.    propranolol (INDERAL) 20 MG tablet Take 1 tablet (20 mg total) by mouth 3 (three) times daily.    propranolol 40 MG/5ML solution Take 5 mg by mouth every evening. 04/11/2023: Patient verified he is taking the liquid form    Propylene Glycol (SYSTANE BALANCE OP) Place 2-3 drops into both eyes daily as needed (dry eyes).    Pseudoeph-Doxylamine-DM-APAP (NYQUIL PO) Take 2 tablets by mouth at bedtime as needed (pain).    sucralfate (CARAFATE) 1 g tablet Take 1 tablet (1 g total) by mouth 4 (four) times daily before meals and nightly (Patient not taking: Reported on 04/11/2023)    VITAMIN E PO Take 1 tablet by mouth daily.    No facility-administered encounter medications on file as of 05/18/2023.    ALLERGIES:  Allergies  Allergen Reactions   Other Itching and Other (See Comments)    Cats Wheezing, runny nose/eyes, congestion     LABORATORY DATA:  I have reviewed the labs as listed.  CBC    Component  Value Date/Time   WBC 8.1 05/12/2023 0803   RBC 5.23 05/12/2023 0803   HGB 15.5 05/12/2023 0803   HCT 47.8 05/12/2023 0803   PLT 165 05/12/2023 0803   MCV 91.4 05/12/2023 0803   MCH 29.6 05/12/2023 0803   MCHC 32.4 05/12/2023 0803   RDW 14.6 05/12/2023 0803   LYMPHSABS 1.3 05/12/2023 0803   MONOABS 0.6 05/12/2023 0803   EOSABS 0.1 05/12/2023 0803   BASOSABS 0.1 05/12/2023 0803      Latest Ref Rng & Units 04/12/2023    4:45 AM 04/11/2023   10:54 AM 12/06/2022    4:38 AM  CMP  Glucose 70 - 99 mg/dL 161  096  045   BUN 6 - 20 mg/dL 9  8  7    Creatinine 0.61 - 1.24 mg/dL 4.09  8.11  9.14   Sodium 135 - 145 mmol/L 134  134  136   Potassium 3.5 - 5.1 mmol/L 3.6  3.6  3.2   Chloride 98 - 111 mmol/L 98  95  97   CO2 22 - 32 mmol/L 27  27  30    Calcium 8.9 - 10.3 mg/dL 8.5  8.9  8.7   Total Protein 6.5 - 8.1 g/dL  6.9    Total Bilirubin 0.3 - 1.2 mg/dL  1.8    Alkaline Phos 38 - 126 U/L  104    AST 15 - 41 U/L  19    ALT 0 - 44 U/L  19      DIAGNOSTIC IMAGING:  I have independently reviewed the relevant imaging and discussed with the patient.   WRAP UP:  All questions were answered. The patient knows to call the clinic with any problems, questions or concerns.  Medical decision making: Moderate  Time spent on visit: I spent 20 minutes counseling the patient face to face. The total time spent in the appointment was 30 minutes and more than 50% was on counseling.  Carnella Guadalajara, PA-C  05/18/23 8:34 AM

## 2023-05-18 ENCOUNTER — Inpatient Hospital Stay (HOSPITAL_BASED_OUTPATIENT_CLINIC_OR_DEPARTMENT_OTHER): Payer: Managed Care, Other (non HMO) | Admitting: Physician Assistant

## 2023-05-18 VITALS — BP 121/87 | HR 76 | Temp 99.2°F | Resp 16 | Wt 200.2 lb

## 2023-05-18 DIAGNOSIS — D509 Iron deficiency anemia, unspecified: Secondary | ICD-10-CM | POA: Diagnosis not present

## 2023-05-18 DIAGNOSIS — K9089 Other intestinal malabsorption: Secondary | ICD-10-CM

## 2023-05-18 DIAGNOSIS — D508 Other iron deficiency anemias: Secondary | ICD-10-CM | POA: Diagnosis not present

## 2023-05-18 NOTE — Patient Instructions (Signed)
[facility] **VISIT SUMMARY & IMPORTANT INSTRUCTIONS **   You were seen today by Rojelio Brenner PA-C for your follow-up visit.    Iron deficiency anemia Your blood levels look great! Your iron levels are trending downwards - I would like you to start taking iron tablet daily. Take over-the-counter ferrous bisglycinate ("gentle iron" or "slow iron") since this is better absorbed with fewer side effects. You do not need any IV iron at this time. Continue to take your iron tablet once daily, at least 2 hours before your acid blocking medication and Carafate. We will recheck your labs in 3 months and see you for follow-up visit in 6 months. HOWEVER, if you feel any increased fatigue, have any episodes of possible blood loss, or any other concerning symptoms, please reach out beforehand and we will schedule you for labs and visit ahead of time if needed.  ** Thank you for trusting me with your healthcare!  I strive to provide all of my patients with quality care at each visit.  If you receive a survey for this visit, I would be so grateful to you for taking the time to provide feedback.  Thank you in advance!  ~ Eliyohu Class                   Dr. Doreatha Massed   &   Rojelio Brenner, PA-C   - - - - - - - - - - - - - - - - - -    Thank you for choosing [facility] at [facility] to provide your oncology and hematology care.  To afford each patient quality time with our provider, please arrive at least 15 minutes before your scheduled appointment time.   If you have a lab appointment with the Cancer Center please come in thru the Main Entrance and check in at the main information desk.  You need to re-schedule your appointment should you arrive 10 or more minutes late.  We strive to give you quality time with our providers, and arriving late affects you and other patients whose appointments are after yours.  Also, if you no show  three or more times for appointments you may be dismissed from the clinic at the providers discretion.     Again, thank you for choosing [facility].  Our hope is that these requests will decrease the amount of time that you wait before being seen by our physicians.       _____________________________________________________________  Should you have questions after your visit to [facility] please contact our office at [phone] and follow the prompts.  Our office hours are 8:00 a.m. and 4:30 p.m. Monday - Friday.  Please note that voicemails left after 4:00 p.m. may not be returned until the following business day.  We are closed weekends and major holidays.  You do have access to a nurse 24-7, just call the main number to the clinic [phone] and do not press any options, hold on the line and a nurse will answer the phone.    For prescription refill requests, have your pharmacy contact our office and allow 72 hours.

## 2023-05-19 ENCOUNTER — Ambulatory Visit (AMBULATORY_SURGERY_CENTER): Payer: Managed Care, Other (non HMO) | Admitting: Gastroenterology

## 2023-05-19 ENCOUNTER — Encounter: Payer: Self-pay | Admitting: Gastroenterology

## 2023-05-19 ENCOUNTER — Inpatient Hospital Stay: Payer: Managed Care, Other (non HMO) | Admitting: Physician Assistant

## 2023-05-19 VITALS — BP 127/80 | HR 62 | Temp 98.2°F | Resp 12 | Ht 74.0 in | Wt 200.0 lb

## 2023-05-19 DIAGNOSIS — K222 Esophageal obstruction: Secondary | ICD-10-CM

## 2023-05-19 DIAGNOSIS — R1310 Dysphagia, unspecified: Secondary | ICD-10-CM | POA: Diagnosis present

## 2023-05-19 MED ORDER — SODIUM CHLORIDE 0.9 % IV SOLN: 500.0000 mL | INTRAVENOUS | Status: DC

## 2023-05-19 MED ORDER — TRIAMCINOLONE ACETONIDE 40 MG/ML IJ SUSP
40.0000 mg | Freq: Once | INTRAMUSCULAR | Status: AC
Start: 2023-05-19 — End: 2023-05-19
  Administered 2023-05-19: 40 mg

## 2023-05-19 NOTE — Progress Notes (Signed)
Report to PACU, RN, vss, BBS= Clear.  

## 2023-05-19 NOTE — Progress Notes (Signed)
Pt's states no medical or surgical changes since previsit or office visit. 

## 2023-05-19 NOTE — Progress Notes (Signed)
Called to room to assist during endoscopic procedure.  Patient ID and intended procedure confirmed with present staff. Received instructions for my participation in the procedure from the performing physician.  

## 2023-05-19 NOTE — Progress Notes (Signed)
GASTROENTEROLOGY PROCEDURE H&P NOTE   Primary Care Physician: Marjo Bicker, MD  HPI: Hector Curry is a 62 y.o. male who presents for EGD for followup of EG anastomosis stricture.  Past Medical History:  Diagnosis Date   Allergic rhinitis    Atrial tachycardia    CAD (coronary artery disease)    a. 05/08/2015 Cath: no significant CAD, LVEF nl-->Med; b. 07/2017 MV: attenuation artifact, no ischemia, EF 65%-->Low risk; c. 03/2021 NSTEMI/PCI: LM nl, LAD 80p (3.0x18 Onyx Frontier DES), D1 90 (too small for PCI), LCX nl, RCA nl.   Diabetes mellitus without complication (HCC)    Diastolic dysfunction    a. 04/2015 Echo: EF 60-65%; b. 05/2016 Echo: EF 60-65%, GrI DD; c. 07/2017 Echo: EF 55-60%, Ao root 42mm; d. 03/2019 Echo: EF 55-60%, Ao root/Asc Ao 42mm; e. 03/2021 Echo: EF 50-55%, apical HK, mod asymm basal-septal hypertrophy. Nl RV fxn. Asc Ao 44mm.   Dilated aortic root (HCC)    a. 07/2017 Echo: 42mm Ao root - mildly dil; b. 03/2019 Echo: Ao root 42mm; c. 03/2021 Echo: Asc Ao 44mm.   Diverticulosis 10/03/2014   Dysrhythmia    Esophageal stricture    a.  In setting of lap band June 2022-stricture at the GJ anastomosis requiring gastrostomy tube; b. 05/2021 s/p esoph stenting (15mm); c. 07/2021 s/p esoph stenting (20mm).   Family history of premature CAD    a. father passed from MI at 30   GERD (gastroesophageal reflux disease)    GIB (gastrointestinal bleeding)    a. 07/2021 following esoph stenting.   Hemorrhoids    a. internal hemorrhoids s/p surgery 1999   History of kidney stones    History of tobacco abuse    Hyperlipidemia    Hyperplastic colon polyp 10/03/2014   a. x 2    Hypertension    Inflammatory arthritis    a. CCP antibodies & x-rays negative. Rheumatoid factor 14, felt to be crystaline over RA or psoriatic   Iron deficiency anemia 02/05/2022   Morbid obesity (HCC)    a. s/p LAP-BAND - complicated by esoph stricture.   Myocardial infarction [facility])    NSTEMI    Nausea and vomiting 04/11/2023   OSA (obstructive sleep apnea)    a. on CPAP   Osteoarthritis    PSVT (paroxysmal supraventricular tachycardia)    a. 48 hr Holter 04/2015: NSR w/ rare PVC, short runs of narrow complex tachycardiac, possible atrial tach, longest run 7 beats, PACs noted (2% of all beats 3600 total) they did not seem to correlate w/ significant arrythmia; b. 08/2017 Event monitor: no significant arrhythmias.   Past Surgical History:  Procedure Laterality Date   BALLOON DILATION N/A 08/14/2021   Procedure: BALLOON DILATION;  Surgeon: Mansouraty, Netty Starring., MD;  Location: [facility] ENDOSCOPY;  Service: Gastroenterology;  Laterality: N/A;   BALLOON DILATION N/A 02/20/2022   Procedure: BALLOON DILATION;  Surgeon: Rachael Fee, MD;  Location: [facility] ENDOSCOPY;  Service: Gastroenterology;  Laterality: N/A;   BALLOON DILATION N/A 03/27/2022   Procedure: BALLOON DILATION;  Surgeon: Meridee Score Netty Starring., MD;  Location: [facility] ENDOSCOPY;  Service: Gastroenterology;  Laterality: N/A;   BARIATRIC SURGERY  01/2021   lap band    BILIARY STENT PLACEMENT N/A 08/14/2021   Procedure: AXIOS STENT PLACEMENT;  Surgeon: Lemar Lofty., MD;  Location: [facility] ENDOSCOPY;  Service: Gastroenterology;  Laterality: N/A;   BILIARY STENT PLACEMENT N/A 10/16/2022   Procedure: STENT PLACEMENT;  Surgeon: Lemar Lofty., MD;  Location: [facility]  ENDOSCOPY;  Service: Gastroenterology;  Laterality: N/A;   BILIARY STENT PLACEMENT N/A 04/09/2023   Procedure: AXIOS STENT PLACEMENT;  Surgeon: Lemar Lofty., MD;  Location: [facility] ENDOSCOPY;  Service: Gastroenterology;  Laterality: N/A;   BIOPSY  07/08/2021   Procedure: BIOPSY;  Surgeon: Meridee Score Netty Starring., MD;  Location: [facility] ENDOSCOPY;  Service: Gastroenterology;;   BIOPSY  08/14/2021   Procedure: BIOPSY;  Surgeon: Lemar Lofty., MD;  Location: [facility] ENDOSCOPY;  Service: Gastroenterology;;   BIOPSY  10/16/2022   Procedure: BIOPSY;  Surgeon:  Lemar Lofty., MD;  Location: [facility] ENDOSCOPY;  Service: Gastroenterology;;   CARDIAC CATHETERIZATION N/A 05/08/2015   Procedure: Left Heart Cath and Coronary Angiography;  Surgeon: Corky Crafts, MD;  Location: [facility] INVASIVE CV LAB;  Service: Cardiovascular;  Laterality: N/A;   CARPAL TUNNEL RELEASE Bilateral    CHOLECYSTECTOMY     COLONOSCOPY  06/27/2005   COLONOSCOPY  10/03/2014   CORONARY STENT INTERVENTION N/A 04/15/2021   Procedure: CORONARY STENT INTERVENTION;  Surgeon: Runell Gess, MD;  Location: [facility] INVASIVE CV LAB;  Service: Cardiovascular;  Laterality: N/A;   DUODENAL STENT PLACEMENT N/A 11/18/2021   Procedure: DUODENAL STENT PLACEMENT;  Surgeon: Meridee Score Netty Starring., MD;  Location: [facility] ENDOSCOPY;  Service: Gastroenterology;  Laterality: N/A;  axios placed at GJ anastamosis   DUODENAL STENT PLACEMENT  03/27/2022   Procedure: GASTRIC STENT PLACEMENT;  Surgeon: Meridee Score Netty Starring., MD;  Location: [facility] ENDOSCOPY;  Service: Gastroenterology;;   ESOPHAGEAL DILATION  05/27/2021   Procedure: ESOPHAGEAL DILATION;  Surgeon: Lemar Lofty., MD;  Location: [facility] ENDOSCOPY;  Service: Gastroenterology;;   ESOPHAGEAL DILATION  01/23/2022   Procedure: ESOPHAGEAL DILATION;  Surgeon: Lemar Lofty., MD;  Location: [facility] ENDOSCOPY;  Service: Gastroenterology;;   ESOPHAGEAL DILATION  04/09/2023   Procedure: ESOPHAGEAL DILATION;  Surgeon: Lemar Lofty., MD;  Location: [facility] ENDOSCOPY;  Service: Gastroenterology;;   ESOPHAGEAL STENT PLACEMENT N/A 05/27/2021   Procedure: ESOPHAGEAL STENT PLACEMENT;  Surgeon: Lemar Lofty., MD;  Location: [facility]OSCOPY;  Service: Gastroenterology;  Laterality: N/A;   ESOPHAGOGASTRODUODENOSCOPY  06/27/2005   ESOPHAGOGASTRODUODENOSCOPY N/A 03/19/2021   Procedure: ESOPHAGOGASTRODUODENOSCOPY (EGD);  Surgeon: Sheliah Hatch, De Blanch, MD;  Location: [facility] ENDOSCOPY;  Service: General;  Laterality: N/A;   ESOPHAGOGASTRODUODENOSCOPY N/A  08/16/2021   Procedure: ESOPHAGOGASTRODUODENOSCOPY (EGD);  Surgeon: Lemar Lofty., MD;  Location: [facility] ENDOSCOPY;  Service: Gastroenterology;  Laterality: N/A;   ESOPHAGOGASTRODUODENOSCOPY (EGD) WITH PROPOFOL N/A 05/27/2021   Procedure: ESOPHAGOGASTRODUODENOSCOPY (EGD) WITH PROPOFOL;  Surgeon: Meridee Score Netty Starring., MD;  Location: [facility] ENDOSCOPY;  Service: Gastroenterology;  Laterality: N/A;   ESOPHAGOGASTRODUODENOSCOPY (EGD) WITH PROPOFOL N/A 07/08/2021   Procedure: ESOPHAGOGASTRODUODENOSCOPY (EGD) WITH PROPOFOL;  Surgeon: Meridee Score Netty Starring., MD;  Location: [facility] ENDOSCOPY;  Service: Gastroenterology;  Laterality: N/A;  fluoro   ESOPHAGOGASTRODUODENOSCOPY (EGD) WITH PROPOFOL N/A 08/14/2021   Procedure: ESOPHAGOGASTRODUODENOSCOPY (EGD) WITH PROPOFOL;  Surgeon: Meridee Score Netty Starring., MD;  Location: [facility] ENDOSCOPY;  Service: Gastroenterology;  Laterality: N/A;  fluoro Axios stent (20 mm)   ESOPHAGOGASTRODUODENOSCOPY (EGD) WITH PROPOFOL N/A 10/16/2021   Procedure: ESOPHAGOGASTRODUODENOSCOPY (EGD) WITH PROPOFOL;  Surgeon: Meridee Score Netty Starring., MD;  Location: [facility] ENDOSCOPY;  Service: Gastroenterology;  Laterality: N/A;  axios stent pull fluoro   ESOPHAGOGASTRODUODENOSCOPY (EGD) WITH PROPOFOL N/A 11/18/2021   Procedure: ESOPHAGOGASTRODUODENOSCOPY (EGD) WITH PROPOFOL;  Surgeon: Meridee Score Netty Starring., MD;  Location: [facility] ENDOSCOPY;  Service: Gastroenterology;  Laterality: N/A;   ESOPHAGOGASTRODUODENOSCOPY (EGD) WITH PROPOFOL N/A 01/23/2022   Procedure: ESOPHAGOGASTRODUODENOSCOPY (EGD) WITH PROPOFOL;  Surgeon: Meridee Score Netty Starring., MD;  Location:  [facility] ENDOSCOPY;  Service: Gastroenterology;  Laterality: N/A;   ESOPHAGOGASTRODUODENOSCOPY (EGD) WITH PROPOFOL N/A 02/20/2022   Procedure: ESOPHAGOGASTRODUODENOSCOPY (EGD) WITH PROPOFOL;  Surgeon: Rachael Fee, MD;  Location: [facility] ENDOSCOPY;  Service: Gastroenterology;  Laterality: N/A;   ESOPHAGOGASTRODUODENOSCOPY (EGD) WITH PROPOFOL N/A 03/27/2022    Procedure: ESOPHAGOGASTRODUODENOSCOPY (EGD) WITH PROPOFOL;  Surgeon: Meridee Score Netty Starring., MD;  Location: [facility] ENDOSCOPY;  Service: Gastroenterology;  Laterality: N/A;   ESOPHAGOGASTRODUODENOSCOPY (EGD) WITH PROPOFOL N/A 08/14/2022   Procedure: ESOPHAGOGASTRODUODENOSCOPY (EGD) WITH PROPOFOL;  Surgeon: Meridee Score Netty Starring., MD;  Location: [facility] ENDOSCOPY;  Service: Gastroenterology;  Laterality: N/A;   ESOPHAGOGASTRODUODENOSCOPY (EGD) WITH PROPOFOL N/A 10/16/2022   Procedure: ESOPHAGOGASTRODUODENOSCOPY (EGD) WITH PROPOFOL;  Surgeon: Meridee Score Netty Starring., MD;  Location: [facility] ENDOSCOPY;  Service: Gastroenterology;  Laterality: N/A;   ESOPHAGOGASTRODUODENOSCOPY (EGD) WITH PROPOFOL N/A 11/28/2022   Procedure: ESOPHAGOGASTRODUODENOSCOPY (EGD) WITH PROPOFOL WITH AXIOS STENT REMOVAL;  Surgeon: Meridee Score Netty Starring., MD;  Location: [facility] ENDOSCOPY;  Service: Gastroenterology;  Laterality: N/A;   ESOPHAGOGASTRODUODENOSCOPY (EGD) WITH PROPOFOL N/A 04/09/2023   Procedure: ESOPHAGOGASTRODUODENOSCOPY (EGD) WITH PROPOFOL;  Surgeon: Meridee Score Netty Starring., MD;  Location: [facility] ENDOSCOPY;  Service: Gastroenterology;  Laterality: N/A;  dilation - axios stent   ESOPHAGOGASTRODUODENOSCOPY (EGD) WITH PROPOFOL N/A 04/11/2023   Procedure: ESOPHAGOGASTRODUODENOSCOPY (EGD) WITH PROPOFOL;  Surgeon: Beverley Fiedler, MD;  Location: [facility] ENDOSCOPY;  Service: Gastroenterology;  Laterality: N/A;   ESOPHAGOGASTRODUODENOSCOPY (EGD) WITH PROPOFOL N/A 04/30/2023   Procedure: ESOPHAGOGASTRODUODENOSCOPY (EGD) WITH PROPOFOL;  Surgeon: Meridee Score Netty Starring., MD;  Location: [facility] ENDOSCOPY;  Service: Gastroenterology;  Laterality: N/A;  axios stent pull   FLEXIBLE SIGMOIDOSCOPY N/A 08/16/2021   Procedure: FLEXIBLE SIGMOIDOSCOPY;  Surgeon: Meridee Score Netty Starring., MD;  Location: [facility] ENDOSCOPY;  Service: Gastroenterology;  Laterality: N/A;   FOREIGN BODY REMOVAL  02/20/2022   Procedure: FOREIGN BODY REMOVAL;  Surgeon: Rachael Fee, MD;  Location: [facility]  ENDOSCOPY;  Service: Gastroenterology;;   FOREIGN BODY REMOVAL ESOPHAGEAL  03/27/2022   Procedure: REMOVAL FOREIGN BODY;  Surgeon: Meridee Score Netty Starring., MD;  Location: [facility] ENDOSCOPY;  Service: Gastroenterology;;   GASTRIC ROUX-EN-Y N/A 02/18/2021   Procedure: LAPAROSCOPIC ROUX-EN-Y GASTRIC BYPASS WITH UPPER ENDOSCOPY,;  Surgeon: Sheliah Hatch De Blanch, MD;  Location: [facility] ORS;  Service: General;  Laterality: N/A;   GASTRIC ROUX-EN-Y N/A 12/02/2022   Procedure: LAPAROSCOPIC converted to OPEN GASTRIC BYPASS REVERSAL, TAKEDOWN OF GASTROJEJUNAL ANASTOMOSIS, GASTRO GASTRIC ANASTOMOSIS WITH UPPER ENDOSCOPY 1. Laparoscopic lysis of adhesions 2. Partial gastrectomy 3. Small bowel resection 4. Creation of esophagus to gastric anastomosis 5. Creation of gastrostomy;  Surgeon: Kinsinger, De Blanch, MD;  Location: [facility] ORS;  Service: General;  Laterality:    GASTROINTESTINAL STENT REMOVAL  01/23/2022   Procedure: Christy Sartorius REMOVAL;  Surgeon: Lemar Lofty., MD;  Location: [facility] ENDOSCOPY;  Service: Gastroenterology;;   GASTROINTESTINAL STENT REMOVAL N/A 04/30/2023   Procedure: GASTROINTESTINAL STENT REMOVAL;  Surgeon: Lemar Lofty., MD;  Location: [facility] ENDOSCOPY;  Service: Gastroenterology;  Laterality: N/A;   GASTROSTOMY N/A 12/02/2022   Procedure: INSERTION OF GASTROSTOMY TUBE;  Surgeon: Sheliah Hatch De Blanch, MD;  Location: [facility] ORS;  Service: General;  Laterality: N/A;   HEMORRHOID SURGERY  08/25/1997   IR GJ TUBE CHANGE  03/27/2021   LAPAROSCOPIC INSERTION GASTROSTOMY TUBE N/A 03/20/2021   Procedure: LAPAROSCOPIC INSERTION GASTROSTOMY TUBE;  Surgeon: Rodman Pickle, MD;  Location: [facility] ORS;  Service: General;  Laterality: N/A;   LEFT HEART CATH AND CORONARY ANGIOGRAPHY N/A 04/15/2021   Procedure: LEFT HEART CATH AND CORONARY ANGIOGRAPHY;  Surgeon: Runell Gess,  MD;  Location: [facility] INVASIVE CV LAB;  Service: Cardiovascular;  Laterality: N/A;   MOUTH SURGERY     removed area which was benign    SCLEROTHERAPY  04/30/2023   Procedure: SCLEROTHERAPY;  Surgeon: Mansouraty, Netty Starring., MD;  Location: [facility] ENDOSCOPY;  Service: Gastroenterology;;   Francine Graven REMOVAL  07/08/2021   Procedure: STENT REMOVAL;  Surgeon: Lemar Lofty., MD;  Location: [facility] ENDOSCOPY;  Service: Gastroenterology;;   Francine Graven REMOVAL  10/16/2021   Procedure: STENT REMOVAL;  Surgeon: Lemar Lofty., MD;  Location: [facility] ENDOSCOPY;  Service: Gastroenterology;;   Francine Graven REMOVAL  08/14/2022   Procedure: STENT REMOVAL;  Surgeon: Lemar Lofty., MD;  Location: [facility] ENDOSCOPY;  Service: Gastroenterology;;   Francine Graven REMOVAL  11/28/2022   Procedure: STENT REMOVAL;  Surgeon: Lemar Lofty., MD;  Location: [facility] ENDOSCOPY;  Service: Gastroenterology;;   Francine Graven REMOVAL  04/11/2023   Procedure: STENT REMOVAL;  Surgeon: Beverley Fiedler, MD;  Location: [facility] ENDOSCOPY;  Service: Gastroenterology;;   STERIOD INJECTION  01/23/2022   Procedure: STEROID INJECTION;  Surgeon: Lemar Lofty., MD;  Location: [facility] ENDOSCOPY;  Service: Gastroenterology;;   SUBMUCOSAL INJECTION  11/18/2021   Procedure: SUBMUCOSAL INJECTION;  Surgeon: Lemar Lofty., MD;  Location: [facility] ENDOSCOPY;  Service: Gastroenterology;;   SUBMUCOSAL INJECTION  08/14/2022   Procedure: SUBMUCOSAL INJECTION;  Surgeon: Lemar Lofty., MD;  Location: [facility] ENDOSCOPY;  Service: Gastroenterology;;   TONSILLECTOMY     UPPER GI ENDOSCOPY N/A 03/20/2021   Procedure: UPPER GI ENDOSCOPY;  Surgeon: Sheliah Hatch De Blanch, MD;  Location: [facility] ORS;  Service: General;  Laterality: N/A;   Current Outpatient Medications  Medication Sig Dispense Refill   Continuous Glucose Sensor (FREESTYLE LIBRE 14 DAY SENSOR) MISC Insert 1 sensor to the back of the arm as directed every 14 days for continuous glucose monitoring 6 each 1   insulin lispro (HUMALOG) 100 UNIT/ML KwikPen Inject subcutaneously 3 (three) times daily with meals Per sliding scale (Patient taking  differently: Inject 3-7 Units into the skin daily as needed (For high blood sugar).) 15 mL 1   Insulin Pen Needle (UNIFINE PENTIPS) 32G X 4 MM MISC Use as needed as directed 100 each 3   Multiple Vitamin (MULTI-VITAMIN DAILY PO) Take 1 patch by mouth daily.     pantoprazole (PROTONIX) 40 MG tablet Take 1 tablet (40 mg total) by mouth 2 (two) times daily before a meal. 60 tablet 3   propranolol (INDERAL) 20 MG tablet Take 1 tablet (20 mg total) by mouth 3 (three) times daily. 90 tablet 11   Propylene Glycol (SYSTANE BALANCE OP) Place 2-3 drops into both eyes daily as needed (dry eyes).     Pseudoeph-Doxylamine-DM-APAP (NYQUIL PO) Take 2 tablets by mouth at bedtime as needed (pain).     VITAMIN E PO Take 1 tablet by mouth daily.     atorvastatin (LIPITOR) 10 MG tablet Take 1 tablet (10 mg total) by mouth daily. 90 tablet 0   clopidogrel (PLAVIX) 75 MG tablet Take 1 tablet (75 mg total) by mouth daily.     fluticasone (FLONASE) 50 MCG/ACT nasal spray Place 2-3 sprays into both nostrils daily as needed for allergies or rhinitis.     mometasone (ELOCON) 0.1 % lotion Apply to affected areas once daily (Patient taking differently: Apply 1 Application topically as needed (psoriasis).) 60 mL 5   nitroGLYCERIN (NITROSTAT) 0.4 MG SL tablet Place 1 tablet (0.4 mg total) under the tongue every 5 (five) minutes as needed for chest pain.  25 tablet 3   ondansetron (ZOFRAN-ODT) 4 MG disintegrating tablet Take 1 tablet (4 mg total) by mouth every 6 (six) hours as needed for nausea or vomiting. 20 tablet 0   OVER THE COUNTER MEDICATION Apply 1 Application topically daily at 6 (six) AM. Medication: Bariatric patch     prochlorperazine (COMPAZINE) 25 MG suppository Place 1 suppository (25 mg total) rectally every 12 (twelve) hours as needed for nausea or vomiting. 6 suppository 0   prochlorperazine (COMPAZINE) 5 MG tablet Take 1 tablet (5 mg total) by mouth every 6 (six) hours as needed for nausea or vomiting. 30 tablet  0   propranolol 40 MG/5ML solution Take 5 mg by mouth every evening. (Patient not taking: Reported on 05/19/2023)     sucralfate (CARAFATE) 1 g tablet Take 1 tablet (1 g total) by mouth 4 (four) times daily before meals and nightly (Patient not taking: Reported on 05/19/2023) 120 tablet 11   Current Facility-Administered Medications  Medication Dose Route Frequency Provider Last Rate Last Admin   0.9 %  sodium chloride infusion  500 mL Intravenous Continuous Mansouraty, Netty Starring., MD        Current Outpatient Medications:    Continuous Glucose Sensor (FREESTYLE LIBRE 14 DAY SENSOR) MISC, Insert 1 sensor to the back of the arm as directed every 14 days for continuous glucose monitoring, Disp: 6 each, Rfl: 1   insulin lispro (HUMALOG) 100 UNIT/ML KwikPen, Inject subcutaneously 3 (three) times daily with meals Per sliding scale (Patient taking differently: Inject 3-7 Units into the skin daily as needed (For high blood sugar).), Disp: 15 mL, Rfl: 1   Insulin Pen Needle (UNIFINE PENTIPS) 32G X 4 MM MISC, Use as needed as directed, Disp: 100 each, Rfl: 3   Multiple Vitamin (MULTI-VITAMIN DAILY PO), Take 1 patch by mouth daily., Disp: , Rfl:    pantoprazole (PROTONIX) 40 MG tablet, Take 1 tablet (40 mg total) by mouth 2 (two) times daily before a meal., Disp: 60 tablet, Rfl: 3   propranolol (INDERAL) 20 MG tablet, Take 1 tablet (20 mg total) by mouth 3 (three) times daily., Disp: 90 tablet, Rfl: 11   Propylene Glycol (SYSTANE BALANCE OP), Place 2-3 drops into both eyes daily as needed (dry eyes)., Disp: , Rfl:    Pseudoeph-Doxylamine-DM-APAP (NYQUIL PO), Take 2 tablets by mouth at bedtime as needed (pain)., Disp: , Rfl:    VITAMIN E PO, Take 1 tablet by mouth daily., Disp: , Rfl:    atorvastatin (LIPITOR) 10 MG tablet, Take 1 tablet (10 mg total) by mouth daily., Disp: 90 tablet, Rfl: 0   clopidogrel (PLAVIX) 75 MG tablet, Take 1 tablet (75 mg total) by mouth daily., Disp: , Rfl:    fluticasone  (FLONASE) 50 MCG/ACT nasal spray, Place 2-3 sprays into both nostrils daily as needed for allergies or rhinitis., Disp: , Rfl:    mometasone (ELOCON) 0.1 % lotion, Apply to affected areas once daily (Patient taking differently: Apply 1 Application topically as needed (psoriasis).), Disp: 60 mL, Rfl: 5   nitroGLYCERIN (NITROSTAT) 0.4 MG SL tablet, Place 1 tablet (0.4 mg total) under the tongue every 5 (five) minutes as needed for chest pain., Disp: 25 tablet, Rfl: 3   ondansetron (ZOFRAN-ODT) 4 MG disintegrating tablet, Take 1 tablet (4 mg total) by mouth every 6 (six) hours as needed for nausea or vomiting., Disp: 20 tablet, Rfl: 0   OVER THE COUNTER MEDICATION, Apply 1 Application topically daily at 6 (six) AM. Medication: Bariatric patch, Disp: ,  Rfl:    prochlorperazine (COMPAZINE) 25 MG suppository, Place 1 suppository (25 mg total) rectally every 12 (twelve) hours as needed for nausea or vomiting., Disp: 6 suppository, Rfl: 0   prochlorperazine (COMPAZINE) 5 MG tablet, Take 1 tablet (5 mg total) by mouth every 6 (six) hours as needed for nausea or vomiting., Disp: 30 tablet, Rfl: 0   propranolol 40 MG/5ML solution, Take 5 mg by mouth every evening. (Patient not taking: Reported on 05/19/2023), Disp: , Rfl:    sucralfate (CARAFATE) 1 g tablet, Take 1 tablet (1 g total) by mouth 4 (four) times daily before meals and nightly (Patient not taking: Reported on 05/19/2023), Disp: 120 tablet, Rfl: 11  Current Facility-Administered Medications:    0.9 %  sodium chloride infusion, 500 mL, Intravenous, Continuous, Mansouraty, Netty Starring., MD Allergies  Allergen Reactions   Other Itching and Other (See Comments)    Cats Wheezing, runny nose/eyes, congestion    Family History  Problem Relation Age of Onset   Hypertension Mother    Diabetes Mother    Heart attack Father 62   CAD Father    Hypertension Sister    Diabetes Other    Hypertension Other    Heart disease Other    Colon cancer Neg Hx     Esophageal cancer Neg Hx    Pancreatic cancer Neg Hx    Stomach cancer Neg Hx    Liver disease Neg Hx    Inflammatory bowel disease Neg Hx    Rectal cancer Neg Hx    Social History   Socioeconomic History   Marital status: Married    Spouse name: Not on file   Number of children: 3   Years of education: Not on file   Highest education level: Not on file  Occupational History   Occupation: IT IT trainer  Tobacco Use   Smoking status: Former    Current packs/day: 0.00    Average packs/day: 1 pack/day for 15.0 years (15.0 ttl pk-yrs)    Types: Cigarettes    Start date: 08/05/1974    Quit date: 08/05/1989    Years since quitting: 33.8   Smokeless tobacco: Never  Vaping Use   Vaping status: Never Used  Substance and Sexual Activity   Alcohol use: Yes    Comment: occ   Drug use: Never   Sexual activity: Not Currently  Other Topics Concern   Not on file  Social History Narrative   Not on file   Social Determinants of Health   Financial Resource Strain: Not on file  Food Insecurity: No Food Insecurity (04/11/2023)   Hunger Vital Sign    Worried About Running Out of Food in the Last Year: Never true    Ran Out of Food in the Last Year: Never true  Transportation Needs: No Transportation Needs (04/11/2023)   PRAPARE - Administrator, Civil Service (Medical): No    Lack of Transportation (Non-Medical): No  Physical Activity: Not on file  Stress: Not on file  Social Connections: Unknown (04/14/2023)   Received from [facility]   Social Network    Social Network: Not on file  Intimate Partner Violence: Unknown (04/14/2023)   Received from [facility]   HITS    Physically Hurt: Not on file    Insult or Talk Down To: Not on file    Threaten Physical Harm: Not on file    Scream or Curse: Not on file    Physical Exam: Today's Vitals  05/19/23 0727  BP: 133/72  Pulse: 72  Temp: 98.2 F (36.8 C)  TempSrc: Temporal  SpO2: 97%  Weight: 200 lb (90.7 kg)   Height: 6\' 2"  (1.88 m)   Body mass index is 25.68 kg/m. GEN: NAD EYE: Sclerae anicteric ENT: MMM CV: Non-tachycardic GI: Soft, NT/ND NEURO:  Alert & Oriented x 3  Lab Results: No results for input(s): "WBC", "HGB", "HCT", "PLT" in the last 72 hours. BMET No results for input(s): "NA", "K", "CL", "CO2", "GLUCOSE", "BUN", "CREATININE", "CALCIUM" in the last 72 hours. LFT No results for input(s): "PROT", "ALBUMIN", "AST", "ALT", "ALKPHOS", "BILITOT", "BILIDIR", "IBILI" in the last 72 hours. PT/INR No results for input(s): "LABPROT", "INR" in the last 72 hours.   Impression / Plan: This is a 62 y.o.male who presents for EGD for followup of EG anastomosis stricture.  The risks and benefits of endoscopic evaluation/treatment were discussed with the patient and/or family; these include but are not limited to the risk of perforation, infection, bleeding, missed lesions, lack of diagnosis, severe illness requiring hospitalization, as well as anesthesia and sedation related illnesses.  The patient's history has been reviewed, patient examined, no change in status, and deemed stable for procedure.  The patient and/or family is agreeable to proceed.    Corliss Parish, MD [facility] Advanced [facility] Office # [phone]

## 2023-05-19 NOTE — Op Note (Signed)
[facility] Patient Name: Stellan Ferman Procedure Date: 05/19/2023 7:44 AM MRN: [id number] Endoscopist: Corliss Parish , MD, [id number] Age: 62 Referring MD:  Date of Birth: 1961-08-05 Gender: Male Account #: [id number] Procedure:                Upper GI endoscopy Indications:              Dysphagia, Post-bariatric anastomotic stenosis, For                            therapy of post-bariatric anastomotic stenosis Medicines:                Monitored Anesthesia Care Procedure:                Pre-Anesthesia Assessment:                           - Prior to the procedure, a History and Physical                            was performed, and patient medications and                            allergies were reviewed. The patient's tolerance of                            previous anesthesia was also reviewed. The risks                            and benefits of the procedure and the sedation                            options and risks were discussed with the patient.                            All questions were answered, and informed consent                            was obtained. Prior Anticoagulants: The patient has                            taken Plavix (clopidogrel), last dose was 5 days                            prior to procedure. ASA Grade Assessment: III - A                            patient with severe systemic disease. After                            reviewing the risks and benefits, the patient was                            deemed in satisfactory condition to undergo the  procedure.                           After obtaining informed consent, the endoscope was                            passed under direct vision. Throughout the                            procedure, the patient's blood pressure, pulse, and                            oxygen saturations were monitored continuously. The                            Olympus Scope  [id number] was introduced through the                            mouth, and advanced to the second part of duodenum.                            The upper GI endoscopy was accomplished without                            difficulty. The patient tolerated the procedure. Scope In: Scope Out: Findings:                 No gross lesions were noted in the entire esophagus.                           One benign-appearing, intrinsic moderate                            (circumferential scarring or stenosis; an endoscope                            may pass) stenosis was found at the esophageal                            anastomosis (43 cm). This stenosis measured 1.2 cm                            (inner diameter) x 1 cm (in length). The stenosis                            was traversed. A TTS dilator was passed through the                            scope. Dilation with a 12-13.5-15 mm balloon and a                            15-16.5-18 mm balloon dilator was performed to 16.5  mm. Area was successfully injected with 4 mL of                            triamcinolone (40 mg/mL) for to decrease risk of                            stricture recurrence.                           A 4 cm hiatal hernia was present.                           A small amount of food (residue) was found in the                            entire examined stomach.                           No gross lesions were noted in the duodenal bulb,                            in the first portion of the duodenum and in the                            second portion of the duodenum. Complications:            No immediate complications. Estimated Blood Loss:     Estimated blood loss was minimal. Impression:               - No gross lesions in the entire esophagus.                           - Benign-appearing esophageal stenosis. Dilated up                            to 16.5 mm. Injected with triamcinolone.                            - 4 cm hiatal hernia.                           - A small amount of food (residue) in the stomach.                           - No gross lesions in the duodenal bulb, in the                            first portion of the duodenum and in the second                            portion of the duodenum. Recommendation:           - The patient will be observed post-procedure,  until all discharge criteria are met.                           - Discharge patient to home.                           - Patient has a contact number available for                            emergencies. The signs and symptoms of potential                            delayed complications were discussed with the                            patient. Return to normal activities tomorrow.                            Written discharge instructions were provided to the                            patient.                           - Dilation diet as per protocol.                           - May restart Plavix on 9/26 to decrease risk of                            post interventional bleeding.                           - Continue present medications. PPI twice daily and                            Carafate twice daily (made a liquid slurry).                           - Repeat upper endoscopy in 3 to 5 weeks for                            retreatment.                           - The findings and recommendations were discussed                            with the patient.                           - The findings and recommendations were discussed                            with the patient's family. Corliss Parish, MD 05/19/2023 8:09:25 AM

## 2023-05-19 NOTE — Patient Instructions (Addendum)
Resume previous diet--START WITH POST DILATION DIET THEN TOMORROW PROCEED TO REGULAR DIET AT TOLERATED Continue present medications, resume PLAVIX ON THURSDAY 05/21/23  REPEAT EGD ON 06/16/23 AT 330 PM AT [facility]  Handouts/information given for dilation and Hiatal hernia   YOU HAD AN ENDOSCOPIC PROCEDURE TODAY AT THE [facility]:   Refer to the procedure report that was given to you for any specific questions about what was found during the examination.  If the procedure report does not answer your questions, please call your gastroenterologist to clarify.  If you requested that your care partner not be given the details of your procedure findings, then the procedure report has been included in a sealed envelope for you to review at your convenience later.  YOU SHOULD EXPECT: Some feelings of bloating in the abdomen. Passage of more gas than usual.  Walking can help get rid of the air that was put into your GI tract during the procedure and reduce the bloating. If you had a lower endoscopy (such as a colonoscopy or flexible sigmoidoscopy) you may notice spotting of blood in your stool or on the toilet paper. If you underwent a bowel prep for your procedure, you may not have a normal bowel movement for a few days.  Please Note:  You might notice some irritation and congestion in your nose or some drainage.  This is from the oxygen used during your procedure.  There is no need for concern and it should clear up in a day or so.  SYMPTOMS TO REPORT IMMEDIATELY:  Following upper endoscopy (EGD)  Vomiting of blood or coffee ground material  New chest pain or pain under the shoulder blades  Painful or persistently difficult swallowing  New shortness of breath  Fever of 100F or higher  Black, tarry-looking stools  For urgent or emergent issues, a gastroenterologist can be reached at any hour by calling [phone]. Do not use MyChart messaging for urgent concerns.    DIET:  SEE POST  DILATION DIET HANDOUT! NOTHING BY MOUTH UNTIL 10 AM, THEN CLEAR LIQUIDS UNTIL 11 AM, THEN SOFT FOODS, AND then you may proceed to your regular diet TOMORROW.  Drink plenty of fluids but you should avoid alcoholic beverages for 24 hours.  ACTIVITY:  You should plan to take it easy for the rest of today and you should NOT DRIVE or use heavy machinery until tomorrow (because of the sedation medicines used during the test).    FOLLOW UP: Our staff will call the number listed on your records the next business day following your procedure.  We will call around 7:15- 8:00 am to check on you and address any questions or concerns that you may have regarding the information given to you following your procedure. If we do not reach you, we will leave a message.      SIGNATURES/CONFIDENTIALITY: You and/or your care partner have signed paperwork which will be entered into your electronic medical record.  These signatures attest to the fact that that the information above on your After Visit Summary has been reviewed and is understood.  Full responsibility of the confidentiality of this discharge information lies with you and/or your care-partner.

## 2023-05-20 ENCOUNTER — Telehealth: Payer: Self-pay

## 2023-05-20 NOTE — Telephone Encounter (Signed)
  Follow up Call-     05/19/2023    7:10 AM  Call back number  Post procedure Call Back phone  # [phone]  Permission to leave phone message Yes     Left message

## 2023-05-25 ENCOUNTER — Other Ambulatory Visit (HOSPITAL_COMMUNITY): Payer: Self-pay

## 2023-05-25 ENCOUNTER — Telehealth: Payer: Self-pay | Admitting: Internal Medicine

## 2023-05-25 ENCOUNTER — Other Ambulatory Visit: Payer: Self-pay

## 2023-05-25 MED ORDER — CLOPIDOGREL BISULFATE 75 MG PO TABS
75.0000 mg | ORAL_TABLET | Freq: Every day | ORAL | 1 refills | Status: DC
  Filled 2023-05-25: qty 90, 90d supply, fill #0
  Filled 2023-10-05: qty 90, 90d supply, fill #1

## 2023-05-25 NOTE — Telephone Encounter (Signed)
Medication refill request completed.  

## 2023-05-25 NOTE — Telephone Encounter (Signed)
*  STAT* If patient is at the pharmacy, call can be transferred to refill team.   1. Which medications need to be refilled? (please list name of each medication and dose if known) clopidogrel (PLAVIX) 75 MG tablet    2. Would you like to learn more about the convenience, safety, & potential cost savings by using the [facility]? Yes      3. Are you open to using the [facility] (Type [facility]. Yes ).   4. Which pharmacy/location (including street and city if local pharmacy) is medication to be sent to? [place] [facility]    5. Do they need a 30 day or 90 day supply? 30

## 2023-05-26 ENCOUNTER — Other Ambulatory Visit: Payer: Self-pay

## 2023-05-26 ENCOUNTER — Encounter: Payer: Self-pay | Admitting: Pharmacist

## 2023-05-27 ENCOUNTER — Other Ambulatory Visit (HOSPITAL_COMMUNITY): Payer: Self-pay

## 2023-06-04 ENCOUNTER — Other Ambulatory Visit: Payer: Self-pay

## 2023-06-04 ENCOUNTER — Encounter: Payer: Self-pay | Admitting: *Deleted

## 2023-06-04 ENCOUNTER — Encounter (HOSPITAL_COMMUNITY): Payer: Self-pay

## 2023-06-04 ENCOUNTER — Emergency Department (HOSPITAL_COMMUNITY)
Admission: EM | Admit: 2023-06-04 | Discharge: 2023-06-04 | Disposition: A | Discharge status: Home / Self Care | Payer: Managed Care, Other (non HMO) | Attending: Emergency Medicine | Admitting: Emergency Medicine

## 2023-06-04 ENCOUNTER — Ambulatory Visit
Admission: EM | Admit: 2023-06-04 | Discharge: 2023-06-04 | Disposition: A | Discharge status: Home / Self Care | Payer: Managed Care, Other (non HMO) | Attending: Internal Medicine | Admitting: Internal Medicine

## 2023-06-04 ENCOUNTER — Emergency Department (HOSPITAL_COMMUNITY): Payer: Managed Care, Other (non HMO)

## 2023-06-04 ENCOUNTER — Ambulatory Visit (INDEPENDENT_AMBULATORY_CARE_PROVIDER_SITE_OTHER): Payer: Managed Care, Other (non HMO)

## 2023-06-04 DIAGNOSIS — R109 Unspecified abdominal pain: Secondary | ICD-10-CM | POA: Diagnosis present

## 2023-06-04 DIAGNOSIS — R1032 Left lower quadrant pain: Secondary | ICD-10-CM

## 2023-06-04 DIAGNOSIS — J189 Pneumonia, unspecified organism: Secondary | ICD-10-CM | POA: Diagnosis not present

## 2023-06-04 LAB — URINALYSIS, W/ REFLEX TO CULTURE (INFECTION SUSPECTED)
Glucose, UA: 100 mg/dL — AB
Hgb urine dipstick: NEGATIVE
Leukocytes,Ua: NEGATIVE
Nitrite: NEGATIVE
Protein, ur: NEGATIVE mg/dL
RBC / HPF: NONE SEEN RBC/hpf (ref 0–5)
Specific Gravity, Urine: >1.03 — ABNORMAL HIGH (ref 1.005–1.030)
pH: 6 (ref 5.0–8.0)

## 2023-06-04 LAB — CBC WITH DIFFERENTIAL/PLATELET
Abs Immature Granulocytes: 0.06 10*3/uL (ref 0.00–0.07)
Basophils Absolute: 0 10*3/uL (ref 0.0–0.1)
Basophils Relative: 1 %
Eosinophils Absolute: 0.1 10*3/uL (ref 0.0–0.5)
Eosinophils Relative: 2 %
HCT: 43.8 % (ref 39.0–52.0)
Hemoglobin: 14.8 g/dL (ref 13.0–17.0)
Immature Granulocytes: 1 %
Lymphocytes Relative: 17 %
Lymphs Abs: 1.4 10*3/uL (ref 0.7–4.0)
MCH: 30.5 pg (ref 26.0–34.0)
MCHC: 33.8 g/dL (ref 30.0–36.0)
MCV: 90.1 fL (ref 80.0–100.0)
Monocytes Absolute: 0.7 10*3/uL (ref 0.1–1.0)
Monocytes Relative: 8 %
Neutro Abs: 6 10*3/uL (ref 1.7–7.7)
Neutrophils Relative %: 71 %
Platelets: 193 10*3/uL (ref 150–400)
RBC: 4.86 MIL/uL (ref 4.22–5.81)
RDW: 14.2 % (ref 11.5–15.5)
WBC: 8.2 10*3/uL (ref 4.0–10.5)
nRBC: 0 % (ref 0.0–0.2)

## 2023-06-04 LAB — BASIC METABOLIC PANEL
Anion gap: 10 (ref 5–15)
BUN: 9 mg/dL (ref 6–20)
CO2: 27 mmol/L (ref 22–32)
Calcium: 8.6 mg/dL — ABNORMAL LOW (ref 8.9–10.3)
Chloride: 98 mmol/L (ref 98–111)
Creatinine, Ser: 0.57 mg/dL — ABNORMAL LOW (ref 0.61–1.24)
GFR, Estimated: >60 mL/min (ref >60)
Glucose, Bld: 147 mg/dL — ABNORMAL HIGH (ref 70–99)
Potassium: 3.3 mmol/L — ABNORMAL LOW (ref 3.5–5.1)
Sodium: 135 mmol/L (ref 135–145)

## 2023-06-04 MED ORDER — IOHEXOL 300 MG/ML  SOLN
100.0000 mL | Freq: Once | INTRAMUSCULAR | Status: AC | PRN
  Administered 2023-06-04: 100 mL via INTRAVENOUS

## 2023-06-04 MED ORDER — DOXYCYCLINE HYCLATE 100 MG PO TABS
100.0000 mg | ORAL_TABLET | Freq: Once | ORAL | Status: AC
  Administered 2023-06-04: 100 mg via ORAL
  Filled 2023-06-04: qty 1

## 2023-06-04 MED ORDER — DOXYCYCLINE HYCLATE 100 MG PO CAPS: 100.0000 mg | ORAL_CAPSULE | Freq: Two times a day (BID) | ORAL | 0 refills | Status: DC

## 2023-06-04 NOTE — ED Provider Notes (Signed)
[facility]    CSN: [id number] Arrival date & time: 06/04/23  1713      History   Chief Complaint Chief Complaint  Patient presents with   Flank Pain    HPI Hector Curry is a 62 y.o. male presents for evaluation of side pain.  Patient reports 2 days of a persistent nonradiating left-sided lateral abdominal pain.  Did prescribe that as an aching pain.  Denies any injury or known inciting event.  Pain is worse with movements or walking.  Cannot identify any alleviating factors.  Denies any fevers, vomiting, diarrhea.  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  He does report some dark-colored urine but also states he has had this since his gastric bypass several years ago.  Denies any dysuria, hematuria, flank pain.  He has not taken any OTC medications for symptoms.  Last bowel movement was yesterday and was normal for him.  No other concerns at this time.   Flank Pain    Past Medical History:  Diagnosis Date   Allergic rhinitis    Atrial tachycardia (HCC)    CAD (coronary artery disease)    a. 05/08/2015 Cath: no significant CAD, LVEF nl-->Med; b. 07/2017 MV: attenuation artifact, no ischemia, EF 65%-->Low risk; c. 03/2021 NSTEMI/PCI: LM nl, LAD 80p (3.0x18 Onyx Frontier DES), D1 90 (too small for PCI), LCX nl, RCA nl.   Diabetes mellitus without complication (HCC)    Diastolic dysfunction    a. 04/2015 Echo: EF 60-65%; b. 05/2016 Echo: EF 60-65%, GrI DD; c. 07/2017 Echo: EF 55-60%, Ao root 42mm; d. 03/2019 Echo: EF 55-60%, Ao root/Asc Ao 42mm; e. 03/2021 Echo: EF 50-55%, apical HK, mod asymm basal-septal hypertrophy. Nl RV fxn. Asc Ao 44mm.   Dilated aortic root (HCC)    a. 07/2017 Echo: 42mm Ao root - mildly dil; b. 03/2019 Echo: Ao root 42mm; c. 03/2021 Echo: Asc Ao 44mm.   Diverticulosis 10/03/2014   Dysrhythmia    Esophageal stricture    a.  In setting of lap band June 2022-stricture at the GJ anastomosis requiring gastrostomy tube; b. 05/2021 s/p  esoph stenting (15mm); c. 07/2021 s/p esoph stenting (20mm).   Family history of premature CAD    a. father passed from MI at 28   GERD (gastroesophageal reflux disease)    GIB (gastrointestinal bleeding)    a. 07/2021 following esoph stenting.   Hemorrhoids    a. internal hemorrhoids s/p surgery 1999   History of kidney stones    History of tobacco abuse    Hyperlipidemia    Hyperplastic colon polyp 10/03/2014   a. x 2    Hypertension    Inflammatory arthritis    a. CCP antibodies & x-rays negative. Rheumatoid factor 14, felt to be crystaline over RA or psoriatic   Iron deficiency anemia 02/05/2022   Morbid obesity (HCC)    a. s/p LAP-BAND - complicated by esoph stricture.   Myocardial infarction [facility])    NSTEMI   Nausea and vomiting 04/11/2023   OSA (obstructive sleep apnea)    a. on CPAP   Osteoarthritis    PSVT (paroxysmal supraventricular tachycardia) (HCC)    a. 48 hr Holter 04/2015: NSR w/ rare PVC, short runs of narrow complex tachycardiac, possible atrial tach, longest run 7 beats, PACs noted (2% of all beats 3600 total) they did not seem to correlate w/ significant arrythmia; b. 08/2017 Event monitor: no significant arrhythmias.    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   Antiplatelet or antithrombotic long-term use 05/05/2021   Abnormal barium swallow 05/05/2021   Dysphagia 05/05/2021    Esophageal stricture 05/05/2021   Abnormal CT of liver 05/05/2021   NSTEMI (non-ST elevated myocardial infarction) (HCC) 04/14/2021   Gastrostomy tube obstruction (HCC) 03/26/2021   Malnutrition of moderate degree 03/22/2021   Dehydration 03/17/2021   Gastric outlet obstruction 03/09/2021   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   Morbid obesity (HCC) 02/18/2021   History of kidney stones 05/30/2016   Elevated LFTs 02/13/2016   Morbid obesity with BMI of 40.0-44.9, adult (HCC) 12/20/2015   Atrial tachycardia (HCC) 06/21/2015   Hypertriglyceridemia 05/15/2015   Coronary artery disease, non-occlusive    DM (diabetes mellitus), type 2 (HCC) 05/08/2015   Chest pain at rest    Hyperglycemia 05/07/2015   Chest pain 05/06/2015   GERD (gastroesophageal reflux disease)    Kidney stones    Hemorrhoids    Inflammatory arthritis    Family history of premature CAD    OSA (obstructive sleep apnea)    History of tobacco abuse    Palpitations    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/20/2014   Diverticulosis 10/03/2014   Hyperplastic colon polyp 10/03/2014   HTN, goal below 140/90 08/07/2014   Pain in the chest 06/19/2014   SOB (shortness of breath) 06/19/2014   Obesity 06/19/2014   Hyperlipidemia 06/19/2014    Past Surgical History:  Procedure Laterality Date   BALLOON DILATION N/A 08/14/2021   Procedure: BALLOON DILATION;  Surgeon: Lemar Lofty., MD;  Location: [facility];  Service: Gastroenterology;  Laterality: N/A;   BALLOON DILATION N/A 02/20/2022   Procedure: BALLOON DILATION;  Surgeon: Rachael Fee, MD;  Location: [facility];  Service: Gastroenterology;  Laterality: N/A;   BALLOON DILATION N/A 03/27/2022   Procedure: BALLOON DILATION;  Surgeon: Meridee Score Netty Starring., MD;  Location[facility]COPY;  Service: Gastroenterology;  Laterality: N/A;   BARIATRIC SURGERY  01/2021   lap band    BILIARY STENT PLACEMENT N/A 08/14/2021   Procedure: AXIOS STENT  PLACEMENT;  Surgeon: Lemar Lofty., MD;  Location: [facility];  Service: Gastroenterology;  Laterality: N/A;   BILIARY STENT PLACEMENT N/A 10/16/2022   Procedure: STENT PLACEMENT;  Surgeon: Lemar Lofty., MD;  Location: [facility];  Service: Gastroenterology;  Laterality: N/A;   BILIARY STENT PLACEMENT N/A 04/09/2023   Procedure: AXIOS STENT PLACEMENT;  Surgeon: Lemar Lofty., MD;  Location: [facility];  Service: Gastroenterology;  Laterality: N/A;   BIOPSY  07/08/2021   Procedure: BIOPSY;  Surgeon: Meridee Score Netty Starring., MD;  Location: [facility];  Service: Gastroenterology;;   BIOPSY  08/14/2021   Procedure: BIOPSY;  Surgeon: Lemar Lofty., MD;  Location: [facility];  Service: Gastroenterology;;   BIOPSY  10/16/2022   Procedure: BIOPSY;  Surgeon: Lemar Lofty., MD;  Location: [facility];  Service: Gastroenterology;;   CARDIAC CATHETERIZATION N/A 05/08/2015   Procedure: Left Heart Cath and Coronary Angiography;  Surgeon: Renelda Loma  Hoyle Barr, MD;  Location: [facility];  Service: Cardiovascular;  Laterality: N/A;   CARPAL TUNNEL RELEASE Bilateral    CHOLECYSTECTOMY     COLONOSCOPY  06/27/2005   COLONOSCOPY  10/03/2014   CORONARY STENT INTERVENTION N/A 04/15/2021   Procedure: CORONARY STENT INTERVENTION;  Surgeon: Runell Gess, MD;  Location: [facility];  Service: Cardiovascular;  Laterality: N/A;   DUODENAL STENT PLACEMENT N/A 11/18/2021   Procedure: DUODENAL STENT PLACEMENT;  Surgeon: Meridee Score Netty Starring., MD;  Location: [facility];  Service: Gastroenterology;  Laterality: N/A;  axios placed at GJ anastamosis   DUODENAL STENT PLACEMENT  03/27/2022   Procedure: GASTRIC STENT PLACEMENT;  Surgeon: Meridee Score Netty Starring., MD;  Location: [facility] ENDOSCOPY;  Service: Gastroenterology;;   ESOPHAGEAL DILATION  05/27/2021   Procedure: ESOPHAGEAL DILATION;  Surgeon: Lemar Lofty., MD;  Location: [facility];   Service: Gastroenterology;;   ESOPHAGEAL DILATION  01/23/2022   Procedure: ESOPHAGEAL DILATION;  Surgeon: Lemar Lofty., MD;  Location: [facility] ENDOSCOPY;  Service: Gastroenterology;;   ESOPHAGEAL DILATION  04/09/2023   Procedure: ESOPHAGEAL DILATION;  Surgeon: Lemar Lofty., MD;  Location: [facility];  Service: Gastroenterology;;   ESOPHAGEAL STENT PLACEMENT N/A 05/27/2021   Procedure: ESOPHAGEAL STENT PLACEMENT;  Surgeon: Lemar Lofty., MD;  Location: [facility];  Service: Gastroenterology;  Laterality: N/A;   ESOPHAGOGASTRODUODENOSCOPY  06/27/2005   ESOPHAGOGASTRODUODENOSCOPY N/A 03/19/2021   Procedure: ESOPHAGOGASTRODUODENOSCOPY (EGD);  Surgeon: Sheliah Hatch, De Blanch, MD;  Location: [facility];  Service: General;  Laterality: N/A;   ESOPHAGOGASTRODUODENOSCOPY N/A 08/16/2021   Procedure: ESOPHAGOGASTRODUODENOSCOPY (EGD);  Surgeon: Lemar Lofty., MD;  Location: [facility] ENDOSCOPY;  Service: Gastroenterology;  Laterality: N/A;   ESOPHAGOGASTRODUODENOSCOPY (EGD) WITH PROPOFOL N/A 05/27/2021   Procedure: ESOPHAGOGASTRODUODENOSCOPY (EGD) WITH PROPOFOL;  Surgeon: Meridee Score Netty Starring., MD;  Location: [facility];  Service: Gastroenterology;  Laterality: N/A;   ESOPHAGOGASTRODUODENOSCOPY (EGD) WITH PROPOFOL N/A 07/08/2021   Procedure: ESOPHAGOGASTRODUODENOSCOPY (EGD) WITH PROPOFOL;  Surgeon: Meridee Score Netty Starring., MD;  Location: [facility];  Service: Gastroenterology;  Laterality: N/A;  fluoro   ESOPHAGOGASTRODUODENOSCOPY (EGD) WITH PROPOFOL N/A 08/14/2021   Procedure: ESOPHAGOGASTRODUODENOSCOPY (EGD) WITH PROPOFOL;  Surgeon: Meridee Score Netty Starring., MD;  Location: [facility];  Service: Gastroenterology;  Laterality: N/A;  fluoro Axios stent (20 mm)   ESOPHAGOGASTRODUODENOSCOPY (EGD) WITH PROPOFOL N/A 10/16/2021   Procedure: ESOPHAGOGASTRODUODENOSCOPY (EGD) WITH PROPOFOL;  Surgeon: Meridee Score Netty Starring., MD;  Location: [facility];  Service:  Gastroenterology;  Laterality: N/A;  axios stent pull fluoro   ESOPHAGOGASTRODUODENOSCOPY (EGD) WITH PROPOFOL N/A 11/18/2021   Procedure: ESOPHAGOGASTRODUODENOSCOPY (EGD) WITH PROPOFOL;  Surgeon: Meridee Score Netty Starring., MD;  Location: [facility];  Service: Gastroenterology;  Laterality: N/A;   ESOPHAGOGASTRODUODENOSCOPY (EGD) WITH PROPOFOL N/A 01/23/2022   Procedure: ESOPHAGOGASTRODUODENOSCOPY (EGD) WITH PROPOFOL;  Surgeon: Meridee Score Netty Starring., MD;  Location: [facility] ENDOSCOPY;  Service: Gastroenterology;  Laterality: N/A;   ESOPHAGOGASTRODUODENOSCOPY (EGD) WITH PROPOFOL N/A 02/20/2022   Procedure: ESOPHAGOGASTRODUODENOSCOPY (EGD) WITH PROPOFOL;  Surgeon: Rachael Fee, MD;  Location: [facility];  Service: Gastroenterology;  Laterality: N/A;   ESOPHAGOGASTRODUODENOSCOPY (EGD) WITH PROPOFOL N/A 03/27/2022   Procedure: ESOPHAGOGASTRODUODENOSCOPY (EGD) WITH PROPOFOL;  Surgeon: Meridee Score Netty Starring., MD;  Location: [facility] ENDOSCOPY;  Service: Gastroenterology;  Laterality: N/A;   ESOPHAGOGASTRODUODENOSCOPY (EGD) WITH PROPOFOL N/A 08/14/2022   Procedure: ESOPHAGOGASTRODUODENOSCOPY (EGD) WITH PROPOFOL;  Surgeon: Meridee Score Netty Starring., MD;  Location: [facility];  Service: Gastroenterology;  Laterality: N/A;   ESOPHAGOGASTRODUODENOSCOPY (EGD) WITH PROPOFOL N/A 10/16/2022   Procedure: ESOPHAGOGASTRODUODENOSCOPY (EGD) WITH PROPOFOL;  Surgeon: Corliss Parish  Montez Hageman., MD;  Location: [facility];  Service: Gastroenterology;  Laterality: N/A;   ESOPHAGOGASTRODUODENOSCOPY (EGD) WITH PROPOFOL N/A 11/28/2022   Procedure: ESOPHAGOGASTRODUODENOSCOPY (EGD) WITH PROPOFOL WITH AXIOS STENT REMOVAL;  Surgeon: Meridee Score Netty Starring., MD;  Location: [facility] ENDOSCOPY;  Service: Gastroenterology;  Laterality: N/A;   ESOPHAGOGASTRODUODENOSCOPY (EGD) WITH PROPOFOL N/A 04/09/2023   Procedure: ESOPHAGOGASTRODUODENOSCOPY (EGD) WITH PROPOFOL;  Surgeon: Meridee Score Netty Starring., MD;  Location: [facility];  Service:  Gastroenterology;  Laterality: N/A;  dilation - axios stent   ESOPHAGOGASTRODUODENOSCOPY (EGD) WITH PROPOFOL N/A 04/11/2023   Procedure: ESOPHAGOGASTRODUODENOSCOPY (EGD) WITH PROPOFOL;  Surgeon: Beverley Fiedler, MD;  Location: [facility];  Service: Gastroenterology;  Laterality: N/A;   ESOPHAGOGASTRODUODENOSCOPY (EGD) WITH PROPOFOL N/A 04/30/2023   Procedure: ESOPHAGOGASTRODUODENOSCOPY (EGD) WITH PROPOFOL;  Surgeon: Meridee Score Netty Starring., MD;  Location: [facility];  Service: Gastroenterology;  Laterality: N/A;  axios stent pull   FLEXIBLE SIGMOIDOSCOPY N/A 08/16/2021   Procedure: FLEXIBLE SIGMOIDOSCOPY;  Surgeon: Meridee Score Netty Starring., MD;  Location: [facility] ENDOSCOPY;  Service: Gastroenterology;  Laterality: N/A;   FOREIGN BODY REMOVAL  02/20/2022   Procedure: FOREIGN BODY REMOVAL;  Surgeon: Rachael Fee, MD;  Location: [facility];  Service: Gastroenterology;;   FOREIGN BODY REMOVAL ESOPHAGEAL  03/27/2022   Procedure: REMOVAL FOREIGN BODY;  Surgeon: Meridee Score Netty Starring., MD;  Location[facility]COPY;  Service: Gastroenterology;;   GASTRIC ROUX-EN-Y N/A 02/18/2021   Procedure: LAPAROSCOPIC ROUX-EN-Y GASTRIC BYPASS WITH UPPER ENDOSCOPY,;  Surgeon: Sheliah Hatch De Blanch, MD;  Location: [facility];  Service: General;  Laterality: N/A;   GASTRIC ROUX-EN-Y N/A 12/02/2022   Procedure: LAPAROSCOPIC converted to OPEN GASTRIC BYPASS REVERSAL, TAKEDOWN OF GASTROJEJUNAL ANASTOMOSIS, GASTRO GASTRIC ANASTOMOSIS WITH UPPER ENDOSCOPY 1. Laparoscopic lysis of adhesions 2. Partial gastrectomy 3. Small bowel resection 4. Creation of esophagus to gastric anastomosis 5. Creation of gastrostomy;  Surgeon: Kinsinger, De Blanch, MD;  Location: [facility];  Service: General;  Laterality:    GASTROINTESTINAL STENT REMOVAL  01/23/2022   Procedure: Christy Sartorius REMOVAL;  Surgeon: Lemar Lofty., MD;  Location: [facility] ENDOSCOPY;  Service: Gastroenterology;;   GASTROINTESTINAL STENT REMOVAL N/A 04/30/2023   Procedure:  GASTROINTESTINAL STENT REMOVAL;  Surgeon: Lemar Lofty., MD;  Location: [facility];  Service: Gastroenterology;  Laterality: N/A;   GASTROSTOMY N/A 12/02/2022   Procedure: INSERTION OF GASTROSTOMY TUBE;  Surgeon: Sheliah Hatch De Blanch, MD;  Location: [facility];  Service: General;  Laterality: N/A;   HEMORRHOID SURGERY  08/25/1997   IR GJ TUBE CHANGE  03/27/2021   LAPAROSCOPIC INSERTION GASTROSTOMY TUBE N/A 03/20/2021   Procedure: LAPAROSCOPIC INSERTION GASTROSTOMY TUBE;  Surgeon: Rodman Pickle, MD;  Location: [facility];  Service: General;  Laterality: N/A;   LEFT HEART CATH AND CORONARY ANGIOGRAPHY N/A 04/15/2021   Procedure: LEFT HEART CATH AND CORONARY ANGIOGRAPHY;  Surgeon: Runell Gess, MD;  Location: [facility];  Service: Cardiovascular;  Laterality: N/A;   MOUTH SURGERY     removed area which was benign   SCLEROTHERAPY  04/30/2023   Procedure: SCLEROTHERAPY;  Surgeon: Mansouraty, Netty Starring., MD;  Location: [facility];  Service: Gastroenterology;;   Francine Graven REMOVAL  07/08/2021   Procedure: STENT REMOVAL;  Surgeon: Lemar Lofty., MD;  Location: [facility];  Service: Gastroenterology;;   Francine Graven REMOVAL  10/16/2021   Procedure: STENT REMOVAL;  Surgeon: Lemar Lofty., MD;  Location: [facility];  Service: Gastroenterology;;   Francine Graven REMOVAL  08/14/2022   Procedure: STENT REMOVAL;  Surgeon: Lemar Lofty., MD;  Location: [facility];  Service: Gastroenterology;;  STENT REMOVAL  11/28/2022   Procedure: STENT REMOVAL;  Surgeon: Lemar Lofty., MD;  Location: [facility] ENDOSCOPY;  Service: Gastroenterology;;   Francine Graven REMOVAL  04/11/2023   Procedure: STENT REMOVAL;  Surgeon: Beverley Fiedler, MD;  Location: [facility];  Service: Gastroenterology;;   STERIOD INJECTION  01/23/2022   Procedure: STEROID INJECTION;  Surgeon: Lemar Lofty., MD;  Location: [facility] ENDOSCOPY;  Service: Gastroenterology;;   SUBMUCOSAL INJECTION  11/18/2021    Procedure: SUBMUCOSAL INJECTION;  Surgeon: Lemar Lofty., MD;  Location: [facility];  Service: Gastroenterology;;   SUBMUCOSAL INJECTION  08/14/2022   Procedure: SUBMUCOSAL INJECTION;  Surgeon: Lemar Lofty., MD;  Location: [facility];  Service: Gastroenterology;;   TONSILLECTOMY     UPPER GI ENDOSCOPY N/A 03/20/2021   Procedure: UPPER GI ENDOSCOPY;  Surgeon: Kinsinger, De Blanch, MD;  Location: [facility];  Service: General;  Laterality: N/A;       Home Medications    Prior to Admission medications   Medication Sig Start Date End Date Taking? Authorizing Provider  atorvastatin (LIPITOR) 10 MG tablet Take 1 tablet (10 mg total) by mouth daily. 09/01/22 12/02/22  Antonieta Iba, MD  clopidogrel (PLAVIX) 75 MG tablet Take 1 tablet (75 mg total) by mouth daily. 05/25/23   Mallipeddi, Vishnu P, MD  Continuous Glucose Sensor (FREESTYLE LIBRE 14 DAY SENSOR) MISC Insert 1 sensor to the back of the arm as directed every 14 days for continuous glucose monitoring 11/24/22     fluticasone (FLONASE) 50 MCG/ACT nasal spray Place 2-3 sprays into both nostrils daily as needed for allergies or rhinitis.    [provider]  insulin lispro (HUMALOG) 100 UNIT/ML KwikPen Inject subcutaneously 3 (three) times daily with meals Per sliding scale Patient taking differently: Inject 3-7 Units into the skin daily as needed (For high blood sugar). 11/24/22     Insulin Pen Needle (UNIFINE PENTIPS) 32G X 4 MM MISC Use as needed as directed 12/22/22     mometasone (ELOCON) 0.1 % lotion Apply to affected areas once daily Patient taking differently: Apply 1 Application topically as needed (psoriasis). 11/19/21     Multiple Vitamin (MULTI-VITAMIN DAILY PO) Take 1 patch by mouth daily.    [provider]  nitroGLYCERIN (NITROSTAT) 0.4 MG SL tablet Place 1 tablet (0.4 mg total) under the tongue every 5 (five) minutes as needed for chest pain. 04/25/21 04/11/23  Antonieta Iba, MD  ondansetron  (ZOFRAN-ODT) 4 MG disintegrating tablet Take 1 tablet (4 mg total) by mouth every 6 (six) hours as needed for nausea or vomiting. 04/09/23   Mansouraty, Netty Starring., MD  OVER THE COUNTER MEDICATION Apply 1 Application topically daily at 6 (six) AM. Medication: Bariatric patch    [provider]  pantoprazole (PROTONIX) 40 MG tablet Take 1 tablet (40 mg total) by mouth 2 (two) times daily before a meal. 04/12/23 08/10/23  Rai, Delene Ruffini, MD  prochlorperazine (COMPAZINE) 25 MG suppository Place 1 suppository (25 mg total) rectally every 12 (twelve) hours as needed for nausea or vomiting. 04/09/23   Mansouraty, Netty Starring., MD  prochlorperazine (COMPAZINE) 5 MG tablet Take 1 tablet (5 mg total) by mouth every 6 (six) hours as needed for nausea or vomiting. 04/09/23   Mansouraty, Netty Starring., MD  propranolol (INDERAL) 20 MG tablet Take 1 tablet (20 mg total) by mouth 3 (three) times daily. 05/07/23     propranolol 40 MG/5ML solution Take 5 mg by mouth every evening. Patient not taking: Reported on 05/19/2023  [provider]  Propylene Glycol (SYSTANE BALANCE OP) Place 2-3 drops into both eyes daily as needed (dry eyes).    [provider]  Pseudoeph-Doxylamine-DM-APAP (NYQUIL PO) Take 2 tablets by mouth at bedtime as needed (pain).    [provider]  sucralfate (CARAFATE) 1 g tablet Take 1 tablet (1 g total) by mouth 4 (four) times daily before meals and nightly Patient not taking: Reported on 05/19/2023 01/01/23     VITAMIN E PO Take 1 tablet by mouth daily.    [provider]    Family History Family History  Problem Relation Age of Onset   Hypertension Mother    Diabetes Mother    Heart attack Father 61   CAD Father    Hypertension Sister    Diabetes Other    Hypertension Other    Heart disease Other    Colon cancer Neg Hx    Esophageal cancer Neg Hx    Pancreatic cancer Neg Hx    Stomach cancer Neg Hx    Liver disease Neg Hx    Inflammatory  bowel disease Neg Hx    Rectal cancer Neg Hx     Social History Social History   Tobacco Use   Smoking status: Former    Current packs/day: 0.00    Average packs/day: 1 pack/day for 15.0 years (15.0 ttl pk-yrs)    Types: Cigarettes    Start date: 08/05/1974    Quit date: 08/05/1989    Years since quitting: 33.8   Smokeless tobacco: Never  Vaping Use   Vaping status: Never Used  Substance Use Topics   Alcohol use: Not Currently    Comment: occ   Drug use: Never     Allergies   Other   Review of Systems Review of Systems  Gastrointestinal:        Left lateral abd pain      Physical Exam Triage Vital Signs ED Triage Vitals  Encounter Vitals Group     BP 06/04/23 1725 134/82     Systolic BP Percentile --      Diastolic BP Percentile --      Pulse Rate 06/04/23 1725 75     Resp 06/04/23 1725 18     Temp 06/04/23 1725 98.8 F (37.1 C)     Temp Source 06/04/23 1725 Oral     SpO2 06/04/23 1725 95 %     Weight 06/04/23 1721 186 lb (84.4 kg)     Height 06/04/23 1721 6\' 2"  (1.88 m)     Head Circumference --      Peak Flow --      Pain Score 06/04/23 1721 5     Pain Loc --      Pain Education --      Exclude from Growth Chart --    No data found.  Updated Vital Signs BP 134/82 (BP Location: Left Arm)   Pulse 75   Temp 98.8 F (37.1 C) (Oral)   Resp 18   Ht 6\' 2"  (1.88 m)   Wt 186 lb (84.4 kg)   SpO2 95%   BMI 23.88 kg/m   Visual Acuity Right Eye Distance:   Left Eye Distance:   Bilateral Distance:    Right Eye Near:   Left Eye Near:    Bilateral Near:     Physical Exam Vitals and nursing note reviewed.  Constitutional:      General: He is not in acute distress.    Appearance: Normal  appearance. He is not ill-appearing.  HENT:     Head: Normocephalic and atraumatic.  Eyes:     Pupils: Pupils are equal, round, and reactive to light.  Cardiovascular:     Rate and Rhythm: Normal rate.  Pulmonary:     Effort: Pulmonary effort is normal.   Abdominal:     General: Bowel sounds are normal. There is no distension.     Palpations: There is no hepatomegaly or splenomegaly.     Tenderness: There is no right CVA tenderness, left CVA tenderness or guarding. Negative signs include McBurney's sign.       Comments: TTP to mid lateral abd area.  No swelling, ecchymosis.  No step-off or deformity.  There is no tenderness to the left upper and lower quadrant or periumbilical area.  Skin:    General: Skin is warm and dry.  Neurological:     General: No focal deficit present.     Mental Status: He is alert and oriented to person, place, and time.  Psychiatric:        Mood and Affect: Mood normal.        Behavior: Behavior normal.      UC Treatments / Results  Labs (all labs ordered are listed, but only abnormal results are displayed) Labs Reviewed  URINALYSIS, W/ REFLEX TO CULTURE (INFECTION SUSPECTED) - Abnormal; Notable for the following components:      Result Value   Color, Urine AMBER (*)    Specific Gravity, Urine >1.030 (*)    Glucose, UA 100 (*)    Bilirubin Urine SMALL (*)    Ketones, ur TRACE (*)    Bacteria, UA RARE (*)    All other components within normal limits    EKG   Radiology No results found.  Procedures Procedures (including critical care time)  Medications Ordered in UC Medications - No data to display  Initial Impression / Assessment and Plan / UC Course  I have reviewed the triage vital signs and the nursing notes.  Pertinent labs & imaging results that were available during my care of the patient were reviewed by me and considered in my medical decision making (see chart for details).  Clinical Course as of 06/04/23 1900  Thu Jun 04, 2023  1824 Urinalysis, w/ Reflex to Culture (Infection Suspected) -Urine, Clean Catch [JM]    Clinical Course User Index [JM] Radford Pax, NP    I reviewed exam, UA, and xray with patient. Pt with hx of gastric bypass with persistent left-sided  abdominal pain with dilated bowels on x-ray.  Also showing Ca oxalate and bilirubin. Pt with hx of kidney stones but states these sx are not similar. Given this I advised pt to go to the ER for further workup.  Patient is in agreement plan but states he prefer to go in the morning as he works at [employer].  Advised by medical advice is to go tonight but did inform if he chooses to wait and he develops any worsening symptoms overnight he is to go to the ER ASAP and he verbalized understanding. Final Clinical Impressions(s) / UC Diagnoses   Final diagnoses:  Left lateral abdominal pain     Discharge Instructions      Please go to the ER for further evaluation of your symptoms      ED Prescriptions   None    PDMP not reviewed this encounter.   Radford Pax, NP 06/04/23 1900

## 2023-06-04 NOTE — ED Triage Notes (Signed)
Left flank pain, hurts all the time, worse when walking.  Wife states urine is dark yellow.

## 2023-06-04 NOTE — ED Notes (Signed)
Discharge instructions reviewed with pt / spouse.   Newly prescribed medications discussed. Pharmacy verified.   Opportunity for questions and concerns provided.   Ambulatory. Displays no signs of distress.

## 2023-06-04 NOTE — ED Triage Notes (Signed)
Pt reports left side pain x 2 days and was sent here from [facility].

## 2023-06-04 NOTE — Discharge Instructions (Signed)
Please go to the ER for further evaluation of your symptoms  

## 2023-06-04 NOTE — Discharge Instructions (Addendum)
You were seen in the emergency department for some left-sided abdominal pain.  You had blood work and a CAT scan that did not show an obvious explanation for the symptoms.  Your x-ray did show possible pneumonia and we are starting you on an antibiotic.  Please follow-up with your primary care team and return to the emergency department if any worsening or concerning symptoms.

## 2023-06-04 NOTE — ED Provider Notes (Signed)
[place] EMERGENCY DEPARTMENT AT [facility] Provider Note   CSN: [id number] Arrival date & time: 06/04/23  1958     History  Chief Complaint  Patient presents with   Abdominal Pain    Hector Curry is a 62 y.o. male.  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  He is having a new different acute pain in his left lower quadrant left lateral abdomen that started 2 or 3 days ago.  It has been worse with movement.  No fever nausea or vomiting.  He does have chronic constipation.  No urinary symptoms.  He went to urgent care and they referred him here for further evaluation.  The history is provided by the patient.  Abdominal Pain Pain location:  LLQ Pain quality: aching   Pain radiates to:  Back Pain severity:  Moderate Onset quality:  Gradual Duration:  3 days Timing:  Constant Progression:  Unchanged Chronicity:  New Context: not trauma   Relieved by:  Nothing Worsened by:  Movement Ineffective treatments:  None tried Associated symptoms: constipation   Associated symptoms: no chest pain, no fever, no hematemesis, no hematuria, no nausea, no shortness of breath and no vomiting        Home Medications Prior to Admission medications   Medication Sig Start Date End Date Taking? Authorizing Provider  atorvastatin (LIPITOR) 10 MG tablet Take 1 tablet (10 mg total) by mouth daily. 09/01/22 12/02/22  Antonieta Iba, MD  clopidogrel (PLAVIX) 75 MG tablet Take 1 tablet (75 mg total) by mouth daily. 05/25/23   Mallipeddi, Vishnu P, MD  Continuous Glucose Sensor (FREESTYLE LIBRE 14 DAY SENSOR) MISC Insert 1 sensor to the back of the arm as directed every 14 days for continuous glucose monitoring 11/24/22     fluticasone (FLONASE) 50 MCG/ACT nasal spray Place 2-3 sprays into both nostrils daily as needed for allergies or rhinitis.    [provider]  insulin lispro (HUMALOG) 100 UNIT/ML KwikPen Inject  subcutaneously 3 (three) times daily with meals Per sliding scale Patient taking differently: Inject 3-7 Units into the skin daily as needed (For high blood sugar). 11/24/22     Insulin Pen Needle (UNIFINE PENTIPS) 32G X 4 MM MISC Use as needed as directed 12/22/22     mometasone (ELOCON) 0.1 % lotion Apply to affected areas once daily Patient taking differently: Apply 1 Application topically as needed (psoriasis). 11/19/21     Multiple Vitamin (MULTI-VITAMIN DAILY PO) Take 1 patch by mouth daily.    [provider]  nitroGLYCERIN (NITROSTAT) 0.4 MG SL tablet Place 1 tablet (0.4 mg total) under the tongue every 5 (five) minutes as needed for chest pain. 04/25/21 04/11/23  Antonieta Iba, MD  ondansetron (ZOFRAN-ODT) 4 MG disintegrating tablet Take 1 tablet (4 mg total) by mouth every 6 (six) hours as needed for nausea or vomiting. 04/09/23   Mansouraty, Netty Starring., MD  OVER THE COUNTER MEDICATION Apply 1 Application topically daily at 6 (six) AM. Medication: Bariatric patch    [provider]  pantoprazole (PROTONIX) 40 MG tablet Take 1 tablet (40 mg total) by mouth 2 (two) times daily before a meal. 04/12/23 08/10/23  Rai, Delene Ruffini, MD  prochlorperazine (COMPAZINE) 25 MG suppository Place 1 suppository (25 mg total) rectally every 12 (twelve) hours as needed for nausea or vomiting. 04/09/23   Mansouraty, Netty Starring., MD  prochlorperazine (COMPAZINE) 5 MG tablet Take 1 tablet (  5 mg total) by mouth every 6 (six) hours as needed for nausea or vomiting. 04/09/23   Mansouraty, Netty Starring., MD  propranolol (INDERAL) 20 MG tablet Take 1 tablet (20 mg total) by mouth 3 (three) times daily. 05/07/23     propranolol 40 MG/5ML solution Take 5 mg by mouth every evening. Patient not taking: Reported on 05/19/2023    [provider]  Propylene Glycol (SYSTANE BALANCE OP) Place 2-3 drops into both eyes daily as needed (dry eyes).    [provider]  Pseudoeph-Doxylamine-DM-APAP (NYQUIL  PO) Take 2 tablets by mouth at bedtime as needed (pain).    [provider]  sucralfate (CARAFATE) 1 g tablet Take 1 tablet (1 g total) by mouth 4 (four) times daily before meals and nightly Patient not taking: Reported on 05/19/2023 01/01/23     VITAMIN E PO Take 1 tablet by mouth daily.    [provider]      Allergies    Other    Review of Systems   Review of Systems  Constitutional:  Negative for fever.  Respiratory:  Negative for shortness of breath.   Cardiovascular:  Negative for chest pain.  Gastrointestinal:  Positive for abdominal pain and constipation. Negative for hematemesis, nausea and vomiting.  Genitourinary:  Negative for hematuria.    Physical Exam Updated Vital Signs BP (!) 168/93 (BP Location: Right Arm)   Pulse 76   Temp 98.3 F (36.8 C) (Oral)   Resp 16   Ht 6\' 2"  (1.88 m)   Wt 84.8 kg   SpO2 97%   BMI 24.01 kg/m  Physical Exam Vitals and nursing note reviewed.  Constitutional:      General: He is not in acute distress.    Appearance: Normal appearance. He is well-developed.  HENT:     Head: Normocephalic and atraumatic.  Eyes:     Conjunctiva/sclera: Conjunctivae normal.  Cardiovascular:     Rate and Rhythm: Normal rate and regular rhythm.     Heart sounds: No murmur heard. Pulmonary:     Effort: Pulmonary effort is normal. No respiratory distress.     Breath sounds: Normal breath sounds.  Abdominal:     Palpations: Abdomen is soft.     Tenderness: There is no abdominal tenderness. There is no guarding or rebound.  Musculoskeletal:        General: No deformity.     Cervical back: Neck supple.  Skin:    General: Skin is warm and dry.     Capillary Refill: Capillary refill takes less than 2 seconds.  Neurological:     General: No focal deficit present.     Mental Status: He is alert and oriented to person, place, and time.     Motor: No weakness.     Gait: Gait normal.     ED Results / Procedures / Treatments    Labs (all labs ordered are listed, but only abnormal results are displayed) Labs Reviewed  BASIC METABOLIC PANEL - Abnormal; Notable for the following components:      Result Value   Potassium 3.3 (*)    Glucose, Bld 147 (*)    Creatinine, Ser 0.57 (*)    Calcium 8.6 (*)    All other components within normal limits  CBC WITH DIFFERENTIAL/PLATELET    EKG None  Radiology DG Chest Port 1 View  Result Date: 06/05/2023 CLINICAL DATA:  Abnormal CT. EXAM: PORTABLE CHEST 1 VIEW COMPARISON:  06/04/2023, 04/09/2023. FINDINGS: The heart size and  mediastinal contours are within normal limits. Mild airspace disease is present at the right lung base. The left lung is clear. No effusion or pneumothorax. No acute osseous abnormality. IMPRESSION: Patchy airspace disease at the right lung base, possible atelectasis or infiltrate. Electronically Signed   By: Thornell Sartorius M.D.   On: 06/05/2023 00:16   CT ABDOMEN PELVIS W CONTRAST  Result Date: 06/04/2023 CLINICAL DATA:  Left flank pain EXAM: CT ABDOMEN AND PELVIS WITH CONTRAST TECHNIQUE: Multidetector CT imaging of the abdomen and pelvis was performed using the standard protocol following bolus administration of intravenous contrast. RADIATION DOSE REDUCTION: This exam was performed according to the departmental dose-optimization program which includes automated exposure control, adjustment of the mA and/or kV according to patient size and/or use of iterative reconstruction technique. CONTRAST:  OMNIPAQUE IOHEXOL 300 MG/ML  SOLN COMPARISON:  Radiograph 06/04/2023, CT 04/14/2021 FINDINGS: Lower chest: Lung bases demonstrate incompletely visualized heterogeneous consolidation at the right middle lobe. Mild circumferential wall thickening of distal esophagus. Hepatobiliary: Status post cholecystectomy. Slightly lobulated liver contour. Pancreas: Unremarkable. No pancreatic ductal dilatation or surrounding inflammatory changes. Spleen: Normal in size  without focal abnormality. Adrenals/Urinary Tract: Adrenal glands are normal. Kidneys show no hydronephrosis. The bladder is normal Stomach/Bowel: Postsurgical changes of the stomach corresponding to history of prior gastric bypass and subsequent reversal. No dilated small bowel. No acute bowel wall thickening. Negative appendix Vascular/Lymphatic: Moderate aortic atherosclerosis. No suspicious lymph nodes Reproductive: Prostate is unremarkable. Other: Negative for pelvic effusion or free air. Musculoskeletal: No acute or suspicious osseous abnormality IMPRESSION: 1. Negative for hydronephrosis or ureteral stone. 2. Postsurgical changes of the stomach corresponding to history of prior gastric bypass and subsequent reversal. 3. Mild circumferential wall thickening of distal esophagus, question esophagitis. 4. Incompletely visualized heterogeneous consolidation at the right middle lobe, appearance suggestive of pneumonia. This could be correlated with chest x-ray. 5. Aortic atherosclerosis. Aortic Atherosclerosis (ICD10-I70.0). Electronically Signed   By: Jasmine Pang M.D.   On: 06/04/2023 22:52   DG Abd 1 View  Result Date: 06/04/2023 CLINICAL DATA:  Left lateral mid abdominal pain EXAM: ABDOMEN - 1 VIEW COMPARISON:  04/09/2023 FINDINGS: Surgical clips in the right upper quadrant. Mild air-filled colon without obstructive pattern. Postsurgical changes in the left mid quadrant. Moderate stool burden. IMPRESSION: Nonobstructed gas pattern. Electronically Signed   By: Jasmine Pang M.D.   On: 06/04/2023 19:45    Procedures Procedures    Medications Ordered in ED Medications  iohexol (OMNIPAQUE) 300 MG/ML solution 100 mL (100 mLs Intravenous Contrast Given 06/04/23 2153)  doxycycline (VIBRA-TABS) tablet 100 mg (100 mg Oral Given 06/04/23 2337)    ED Course/ Medical Decision Making/ A&P Clinical Course as of 06/05/23 1014  Thu Jun 04, 2023  2322 Patient CT abdominal imaging did not show any obvious  explanation for his left-sided pain.  He has known esophageal problems and had all that postsurgical change related to his stomach bypass.  He is not having any pulmonary symptoms.  He does not feel strongly about starting on antibiotics for pneumonia.  I have not read his chest x-ray yet but I do see what they are calling on the right side. [MB]    Clinical Course User Index [MB] Terrilee Files, MD                                 Medical Decision Making Amount and/or Complexity of Data Reviewed Labs: ordered.  Radiology: ordered.  Risk Prescription drug management.   This patient complains of left lower left lateral abdominal pain; this involves an extensive number of treatment Options and is a complaint that carries with it a high risk of complications and morbidity. The differential includes diverticulitis, colitis, perforation, obstruction, renal colic  I ordered, reviewed and interpreted labs, which included CBC normal chemistries with mildly low potassium elevated glucose, urinalysis reviewed from urgent care did not show obvious signs of infection I ordered medication oral doxycycline and reviewed PMP when indicated. I ordered imaging studies which included chest x-ray and CT abdomen and pelvis and I independently    visualized and interpreted imaging which showed possible infiltrate, inflammatory changes postsurgical, no definite explanation for left-sided abdominal pain Additional history obtained from patient's wife Previous records obtained and reviewed in epic including recent GI and general surgery notes Cardiac monitoring reviewed, sinus rhythm Social determinants considered, no significant barriers Critical Interventions: None  After the interventions stated above, I reevaluated the patient and found patient had minimal pain and no respiratory symptoms Admission and further testing considered, no indications for admission at this time.  Recommended close follow-up with  PCP.  Due to chest x-ray and CT findings will cover with antibiotics although does not appears to be symptomatic at this time.  Return instructions discussed.          Final Clinical Impression(s) / ED Diagnoses Final diagnoses:  Left sided abdominal pain  Community acquired pneumonia of right middle lobe of lung    Rx / DC Orders ED Discharge Orders          Ordered    doxycycline (VIBRAMYCIN) 100 MG capsule  2 times daily        06/04/23 2326              Terrilee Files, MD 06/05/23 1017

## 2023-06-11 ENCOUNTER — Encounter: Payer: Self-pay | Admitting: Internal Medicine

## 2023-06-11 ENCOUNTER — Ambulatory Visit: Payer: Managed Care, Other (non HMO) | Attending: Internal Medicine | Admitting: Internal Medicine

## 2023-06-11 ENCOUNTER — Other Ambulatory Visit: Payer: Self-pay

## 2023-06-11 VITALS — BP 136/72 | HR 75 | Ht 74.0 in | Wt 198.8 lb

## 2023-06-11 DIAGNOSIS — I4719 Other supraventricular tachycardia: Secondary | ICD-10-CM | POA: Diagnosis not present

## 2023-06-11 MED ORDER — NITROGLYCERIN 0.4 MG SL SUBL
0.4000 mg | SUBLINGUAL_TABLET | SUBLINGUAL | 3 refills | Status: DC | PRN
  Filled 2023-06-11: qty 25, 8d supply, fill #0

## 2023-06-11 NOTE — Progress Notes (Signed)
Cardiology Office Note  Date: 06/11/2023   ID: Hector Curry, DOB 15-Apr-1961, MRN [id number]  PCP:  [facility]  Cardiologist:  Marjo Bicker, MD Electrophysiologist:  None   Reason for Office Visit: CAD follow-up   History of Present Illness: Hector Curry is a 62 y.o. male known to have CAD s/p LAD PCI in 2022 with residual diagonal 90% stenosis on Plavix monotherapy and normal LVEF, atrial tachycardia, DM2, HLD, gastric bypass surgery history complicated by stricturing of gastrojejunal anastomosis (s/p AX IOS cold stenting x 4), OSA on BiPAP presented to cardiology clinic for follow-up visit.  Overall doing great, underwent esophageal dilatation recently.  No complications.  No angina, DOE, dizziness, presyncope, syncope.  Has palpitations especially at night, takes propranolol and sometimes had to take additional dose of blood flow at night.  Jogs couple of times a week with no symptoms of angina or DOE.  Past Medical History:  Diagnosis Date   Allergic rhinitis    Atrial tachycardia (HCC)    CAD (coronary artery disease)    a. 05/08/2015 Cath: no significant CAD, LVEF nl-->Med; b. 07/2017 MV: attenuation artifact, no ischemia, EF 65%-->Low risk; c. 03/2021 NSTEMI/PCI: LM nl, LAD 80p (3.0x18 Onyx Frontier DES), D1 90 (too small for PCI), LCX nl, RCA nl.   Diabetes mellitus without complication (HCC)    Diastolic dysfunction    a. 04/2015 Echo: EF 60-65%; b. 05/2016 Echo: EF 60-65%, GrI DD; c. 07/2017 Echo: EF 55-60%, Ao root 42mm; d. 03/2019 Echo: EF 55-60%, Ao root/Asc Ao 42mm; e. 03/2021 Echo: EF 50-55%, apical HK, mod asymm basal-septal hypertrophy. Nl RV fxn. Asc Ao 44mm.   Dilated aortic root (HCC)    a. 07/2017 Echo: 42mm Ao root - mildly dil; b. 03/2019 Echo: Ao root 42mm; c. 03/2021 Echo: Asc Ao 44mm.   Diverticulosis 10/03/2014   Dysrhythmia    Esophageal stricture    a.  In setting of lap band June 2022-stricture at the GJ anastomosis requiring  gastrostomy tube; b. 05/2021 s/p esoph stenting (15mm); c. 07/2021 s/p esoph stenting (20mm).   Family history of premature CAD    a. father passed from MI at 12   GERD (gastroesophageal reflux disease)    GIB (gastrointestinal bleeding)    a. 07/2021 following esoph stenting.   Hemorrhoids    a. internal hemorrhoids s/p surgery 1999   History of kidney stones    History of tobacco abuse    Hyperlipidemia    Hyperplastic colon polyp 10/03/2014   a. x 2    Hypertension    Inflammatory arthritis    a. CCP antibodies & x-rays negative. Rheumatoid factor 14, felt to be crystaline over RA or psoriatic   Iron deficiency anemia 02/05/2022   Morbid obesity (HCC)    a. s/p LAP-BAND - complicated by esoph stricture.   Myocardial infarction [facility])    NSTEMI   Nausea and vomiting 04/11/2023   OSA (obstructive sleep apnea)    a. on CPAP   Osteoarthritis    PSVT (paroxysmal supraventricular tachycardia) (HCC)    a. 48 hr Holter 04/2015: NSR w/ rare PVC, short runs of narrow complex tachycardiac, possible atrial tach, longest run 7 beats, PACs noted (2% of all beats 3600 total) they did not seem to correlate w/ significant arrythmia; b. 08/2017 Event monitor: no significant arrhythmias.    Past Surgical History:  Procedure Laterality Date   BALLOON DILATION N/A 08/14/2021   Procedure: BALLOON DILATION;  Surgeon: Corliss Parish  Montez Hageman., MD;  Location: [facility] ENDOSCOPY;  Service: Gastroenterology;  Laterality: N/A;   BALLOON DILATION N/A 02/20/2022   Procedure: BALLOON DILATION;  Surgeon: Rachael Fee, MD;  Location: [facility] ENDOSCOPY;  Service: Gastroenterology;  Laterality: N/A;   BALLOON DILATION N/A 03/27/2022   Procedure: BALLOON DILATION;  Surgeon: Meridee Score Netty Starring., MD;  Location: [facility] ENDOSCOPY;  Service: Gastroenterology;  Laterality: N/A;   BARIATRIC SURGERY  01/2021   lap band    BILIARY STENT PLACEMENT N/A 08/14/2021   Procedure: AXIOS STENT PLACEMENT;  Surgeon: Lemar Lofty., MD;  Location: [facility] ENDOSCOPY;  Service: Gastroenterology;  Laterality: N/A;   BILIARY STENT PLACEMENT N/A 10/16/2022   Procedure: STENT PLACEMENT;  Surgeon: Lemar Lofty., MD;  Location: [facility]OSCOPY;  Service: Gastroenterology;  Laterality: N/A;   BILIARY STENT PLACEMENT N/A 04/09/2023   Procedure: AXIOS STENT PLACEMENT;  Surgeon: Lemar Lofty., MD;  Location: [facility] ENDOSCOPY;  Service: Gastroenterology;  Laterality: N/A;   BIOPSY  07/08/2021   Procedure: BIOPSY;  Surgeon: Meridee Score Netty Starring., MD;  Location: [facility] ENDOSCOPY;  Service: Gastroenterology;;   BIOPSY  08/14/2021   Procedure: BIOPSY;  Surgeon: Lemar Lofty., MD;  Location: [facility]COPY;  Service: Gastroenterology;;   BIOPSY  10/16/2022   Procedure: BIOPSY;  Surgeon: Lemar Lofty., MD;  Location: [facility]OSCOPY;  Service: Gastroenterology;;   CARDIAC CATHETERIZATION N/A 05/08/2015   Procedure: Left Heart Cath and Coronary Angiography;  Surgeon: Corky Crafts, MD;  Location[facility]VASIVE CV LAB;  Service: Cardiovascular;  Laterality: N/A;   CARPAL TUNNEL RELEASE Bilateral    CHOLECYSTECTOMY     COLONOSCOPY  06/27/2005   COLONOSCOPY  10/03/2014   CORONARY STENT INTERVENTION N/A 04/15/2021   Procedure: CORONARY STENT INTERVENTION;  Surgeon: Runell Gess, MD;  Location: [facility] INVASIVE CV LAB;  Service: Cardiovascular;  Laterality: N/A;   DUODENAL STENT PLACEMENT N/A 11/18/2021   Procedure: DUODENAL STENT PLACEMENT;  Surgeon: Meridee Score Netty Starring., MD;  Location: [facility] ENDOSCOPY;  Service: Gastroenterology;  Laterality: N/A;  axios placed at GJ anastamosis   DUODENAL STENT PLACEMENT  03/27/2022   Procedure: GASTRIC STENT PLACEMENT;  Surgeon: Meridee Score Netty Starring., MD;  Location: [facility] ENDOSCOPY;  Service: Gastroenterology;;   ESOPHAGEAL DILATION  05/27/2021   Procedure: ESOPHAGEAL DILATION;  Surgeon: Lemar Lofty., MD;  Location: [facility];  Service: Gastroenterology;;    ESOPHAGEAL DILATION  01/23/2022   Procedure: ESOPHAGEAL DILATION;  Surgeon: Lemar Lofty., MD;  Location: [facility] ENDOSCOPY;  Service: Gastroenterology;;   ESOPHAGEAL DILATION  04/09/2023   Procedure: ESOPHAGEAL DILATION;  Surgeon: Lemar Lofty., MD;  Location: [facility]COPY;  Service: Gastroenterology;;   ESOPHAGEAL STENT PLACEMENT N/A 05/27/2021   Procedure: ESOPHAGEAL STENT PLACEMENT;  Surgeon: Lemar Lofty., MD;  Location: [facility];  Service: Gastroenterology;  Laterality: N/A;   ESOPHAGOGASTRODUODENOSCOPY  06/27/2005   ESOPHAGOGASTRODUODENOSCOPY N/A 03/19/2021   Procedure: ESOPHAGOGASTRODUODENOSCOPY (EGD);  Surgeon: Sheliah Hatch, De Blanch, MD;  Location: [facility] ENDOSCOPY;  Service: General;  Laterality: N/A;   ESOPHAGOGASTRODUODENOSCOPY N/A 08/16/2021   Procedure: ESOPHAGOGASTRODUODENOSCOPY (EGD);  Surgeon: Lemar Lofty., MD;  Location: [facility] ENDOSCOPY;  Service: Gastroenterology;  Laterality: N/A;   ESOPHAGOGASTRODUODENOSCOPY (EGD) WITH PROPOFOL N/A 05/27/2021   Procedure: ESOPHAGOGASTRODUODENOSCOPY (EGD) WITH PROPOFOL;  Surgeon: Meridee Score Netty Starring., MD;  Location: [facility] ENDOSCOPY;  Service: Gastroenterology;  Laterality: N/A;   ESOPHAGOGASTRODUODENOSCOPY (EGD) WITH PROPOFOL N/A 07/08/2021   Procedure: ESOPHAGOGASTRODUODENOSCOPY (EGD) WITH PROPOFOL;  Surgeon: Meridee Score Netty Starring., MD;  Location: [facility] ENDOSCOPY;  Service: Gastroenterology;  Laterality: N/A;  fluoro  ESOPHAGOGASTRODUODENOSCOPY (EGD) WITH PROPOFOL N/A 08/14/2021   Procedure: ESOPHAGOGASTRODUODENOSCOPY (EGD) WITH PROPOFOL;  Surgeon: Meridee Score Netty Starring., MD;  Location: [facility] ENDOSCOPY;  Service: Gastroenterology;  Laterality: N/A;  fluoro Axios stent (20 mm)   ESOPHAGOGASTRODUODENOSCOPY (EGD) WITH PROPOFOL N/A 10/16/2021   Procedure: ESOPHAGOGASTRODUODENOSCOPY (EGD) WITH PROPOFOL;  Surgeon: Meridee Score Netty Starring., MD;  Location: [facility] END[facility];  Service: Gastroenterology;  Laterality: N/A;  axios  stent pull fluoro   ESOPHAGOGASTRODUODENOSCOPY (EGD) WITH PROPOFOL N/A 11/18/2021   Procedure: ESOPHAGOGASTRODUODENOSCOPY (EGD) WITH PROPOFOL;  Surgeon: Meridee Score Netty Starring., MD;  Location: [facility];  Service: Gastroenterology;  Laterality: N/A;   ESOPHAGOGASTRODUODENOSCOPY (EGD) WITH PROPOFOL N/A 01/23/2022   Procedure: ESOPHAGOGASTRODUODENOSCOPY (EGD) WITH PROPOFOL;  Surgeon: Meridee Score Netty Starring., MD;  Location: [facility] ENDOSCOPY;  Service: Gastroenterology;  Laterality: N/A;   ESOPHAGOGASTRODUODENOSCOPY (EGD) WITH PROPOFOL N/A 02/20/2022   Procedure: ESOPHAGOGASTRODUODENOSCOPY (EGD) WITH PROPOFOL;  Surgeon: Rachael Fee, MD;  Location: [facility];  Service: Gastroenterology;  Laterality: N/A;   ESOPHAGOGASTRODUODENOSCOPY (EGD) WITH PROPOFOL N/A 03/27/2022   Procedure: ESOPHAGOGASTRODUODENOSCOPY (EGD) WITH PROPOFOL;  Surgeon: Meridee Score Netty Starring., MD;  Location: [facility] ENDOSCOPY;  Service: Gastroenterology;  Laterality: N/A;   ESOPHAGOGASTRODUODENOSCOPY (EGD) WITH PROPOFOL N/A 08/14/2022   Procedure: ESOPHAGOGASTRODUODENOSCOPY (EGD) WITH PROPOFOL;  Surgeon: Meridee Score Netty Starring., MD;  Location: [facility];  Service: Gastroenterology;  Laterality: N/A;   ESOPHAGOGASTRODUODENOSCOPY (EGD) WITH PROPOFOL N/A 10/16/2022   Procedure: ESOPHAGOGASTRODUODENOSCOPY (EGD) WITH PROPOFOL;  Surgeon: Meridee Score Netty Starring., MD;  Location: [facility]OS[facility];  Service: Gastroenterology;  Laterality: N/A;   ESOPHAGOGASTRODUODENOSCOPY (EGD) WITH PROPOFOL N/A 11/28/2022   Procedure: ESOPHAGOGASTRODUODENOSCOPY (EGD) WITH PROPOFOL WITH AXIOS STENT REMOVAL;  Surgeon: Meridee Score Netty Starring., MD;  Location: [facility] ENDOSCOPY;  Service: Gastroenterology;  Laterality: N/A;   ESOPHAGOGASTRODUODENOSCOPY (EGD) WITH PROPOFOL N/A 04/09/2023   Procedure: ESOPHAGOGASTRODUODENOSCOPY (EGD) WITH PROPOFOL;  Surgeon: Meridee Score Netty Starring., MD;  Location: [facility];  Service: Gastroenterology;  Laterality: N/A;  dilation - axios  stent   ESOPHAGOGASTRODUODENOSCOPY (EGD) WITH PROPOFOL N/A 04/11/2023   Procedure: ESOPHAGOGASTRODUODENOSCOPY (EGD) WITH PROPOFOL;  Surgeon: Beverley Fiedler, MD;  Location: [facility] ENDOS[facility];  Service: Gastroenterology;  Laterality: N/A;   ESOPHAGOGASTRODUODENOSCOPY (EGD) WITH PROPOFOL N/A 04/30/2023   Procedure: ESOPHAGOGASTRODUODENOSCOPY (EGD) WITH PROPOFOL;  Surgeon: Meridee Score Netty Starring., MD;  Location: [facility] ENDOSCOPY;  Service: Gastroenterology;  Laterality: N/A;  axios stent pull   FLEXIBLE SIGMOIDOSCOPY N/A 08/16/2021   Procedure: FLEXIBLE SIGMOIDOSCOPY;  Surgeon: Meridee Score Netty Starring., MD;  Location: [facility] ENDOSCOPY;  Service: Gastroenterology;  Laterality: N/A;   FOREIGN BODY REMOVAL  02/20/2022   Procedure: FOREIGN BODY REMOVAL;  Surgeon: Rachael Fee, MD;  Location: [facility] ENDOSCOPY;  Service: Gastroenterology;;   FOREIGN BODY REMOVAL ESOPHAGEAL  03/27/2022   Procedure: REMOVAL FOREIGN BODY;  Surgeon: Meridee Score Netty Starring., MD;  Location: [facility] ENDOSCOPY;  Service: Gastroenterology;;   GASTRIC ROUX-EN-Y N/A 02/18/2021   Procedure: LAPAROSCOPIC ROUX-EN-Y GASTRIC BYPASS WITH UPPER ENDOSCOPY,;  Surgeon: Sheliah Hatch De Blanch, MD;  Location: [facility] ORS;  Service: General;  Laterality: N/A;   GASTRIC ROUX-EN-Y N/A 12/02/2022   Procedure: LAPAROSCOPIC converted to OPEN GASTRIC BYPASS REVERSAL, TAKEDOWN OF GASTROJEJUNAL ANASTOMOSIS, GASTRO GASTRIC ANASTOMOSIS WITH UPPER ENDOSCOPY 1. Laparoscopic lysis of adhesions 2. Partial gastrectomy 3. Small bowel resection 4. Creation of esophagus to gastric anastomosis 5. Creation of gastrostomy;  Surgeon: Kinsinger, De Blanch, MD;  Location: [facility] ORS;  Service: General;  Laterality:    GASTROINTESTINAL STENT REMOVAL  01/23/2022   Procedure: Christy Sartorius REMOVAL;  Surgeon: Lemar Lofty., MD;  Location: [facility] ENDOSCOPY;  Service: Gastroenterology;;  GASTROINTESTINAL STENT REMOVAL N/A 04/30/2023   Procedure: GASTROINTESTINAL STENT REMOVAL;  Surgeon: Lemar Lofty., MD;  Location: [facility] ENDOSCOPY;  Service: Gastroenterology;  Laterality: N/A;   GASTROSTOMY N/A 12/02/2022   Procedure: INSERTION OF GASTROSTOMY TUBE;  Surgeon: Sheliah Hatch De Blanch, MD;  Location: [facility] ORS;  Service: General;  Laterality: N/A;   HEMORRHOID SURGERY  08/25/1997   IR GJ TUBE CHANGE  03/27/2021   LAPAROSCOPIC INSERTION GASTROSTOMY TUBE N/A 03/20/2021   Procedure: LAPAROSCOPIC INSERTION GASTROSTOMY TUBE;  Surgeon: Rodman Pickle, MD;  Location: [facility] ORS;  Service: General;  Laterality: N/A;   LEFT HEART CATH AND CORONARY ANGIOGRAPHY N/A 04/15/2021   Procedure: LEFT HEART CATH AND CORONARY ANGIOGRAPHY;  Surgeon: Runell Gess, MD;  Location: [facility] INVASIVE CV LAB;  Service: Cardiovascular;  Laterality: N/A;   MOUTH SURGERY     removed area which was benign   SCLEROTHERAPY  04/30/2023   Procedure: SCLEROTHERAPY;  Surgeon: Mansouraty, Netty Starring., MD;  Location: [facility] ENDOSCOPY;  Service: Gastroenterology;;   Francine Graven REMOVAL  07/08/2021   Procedure: STENT REMOVAL;  Surgeon: Lemar Lofty., MD;  Location: [facility] ENDOSCOPY;  Service: Gastroenterology;;   Francine Graven REMOVAL  10/16/2021   Procedure: STENT REMOVAL;  Surgeon: Lemar Lofty., MD;  Location: [facility] ENDOSCOPY;  Service: Gastroenterology;;   Francine Graven REMOVAL  08/14/2022   Procedure: STENT REMOVAL;  Surgeon: Lemar Lofty., MD;  Location: [facility] ENDOSCOPY;  Service: Gastroenterology;;   Francine Graven REMOVAL  11/28/2022   Procedure: STENT REMOVAL;  Surgeon: Lemar Lofty., MD;  Location: [facility] ENDOSCOPY;  Service: Gastroenterology;;   Francine Graven REMOVAL  04/11/2023   Procedure: STENT REMOVAL;  Surgeon: Beverley Fiedler, MD;  Location: [facility] ENDOSCOPY;  Service: Gastroenterology;;   STERIOD INJECTION  01/23/2022   Procedure: STEROID INJECTION;  Surgeon: Lemar Lofty., MD;  Location: [facility] ENDOSCOPY;  Service: Gastroenterology;;   SUBMUCOSAL INJECTION  11/18/2021   Procedure: SUBMUCOSAL INJECTION;  Surgeon: Lemar Lofty., MD;  Location: [facility]OSCOPY;  Service: Gastroenterology;;   SUBMUCOSAL INJECTION  08/14/2022   Procedure: SUBMUCOSAL INJECTION;  Surgeon: Lemar Lofty., MD;  Location: [facility] ENDOSCOPY;  Service: Gastroenterology;;   TONSILLECTOMY     UPPER GI ENDOSCOPY N/A 03/20/2021   Procedure: UPPER GI ENDOSCOPY;  Surgeon: Rodman Pickle, MD;  Location: [facility] ORS;  Service: General;  Laterality: N/A;    Current Outpatient Medications  Medication Sig Dispense Refill   clopidogrel (PLAVIX) 75 MG tablet Take 1 tablet (75 mg total) by mouth daily. 90 tablet 1   Continuous Glucose Sensor (FREESTYLE LIBRE 14 DAY SENSOR) MISC Insert 1 sensor to the back of the arm as directed every 14 days for continuous glucose monitoring 6 each 1   doxycycline (VIBRAMYCIN) 100 MG capsule Take 1 capsule (100 mg total) by mouth 2 (two) times daily. 20 capsule 0   fluticasone (FLONASE) 50 MCG/ACT nasal spray Place 2-3 sprays into both nostrils daily as needed for allergies or rhinitis.     insulin lispro (HUMALOG) 100 UNIT/ML KwikPen Inject subcutaneously 3 (three) times daily with meals Per sliding scale (Patient taking differently: Inject 3-7 Units into the skin daily as needed (For high blood sugar).) 15 mL 1   Insulin Pen Needle (UNIFINE PENTIPS) 32G X 4 MM MISC Use as needed as directed 100 each 3   mometasone (ELOCON) 0.1 % lotion Apply to affected areas once daily (Patient taking differently: Apply 1 Application topically as needed (psoriasis).) 60 mL 5   Multiple Vitamin (MULTI-VITAMIN DAILY PO) Take 1  patch by mouth daily.     nitroGLYCERIN (NITROSTAT) 0.4 MG SL tablet Place 1 tablet (0.4 mg total) under the tongue every 5 (five) minutes as needed for chest pain. 25 tablet 3   ondansetron (ZOFRAN-ODT) 4 MG disintegrating tablet Take 1 tablet (4 mg total) by mouth every 6 (six) hours as needed for nausea or vomiting. 20 tablet 0   OVER THE COUNTER MEDICATION Apply 1 Application topically daily at 6 (six) AM.  Medication: Bariatric patch     pantoprazole (PROTONIX) 40 MG tablet Take 1 tablet (40 mg total) by mouth 2 (two) times daily before a meal. 60 tablet 3   prochlorperazine (COMPAZINE) 25 MG suppository Place 1 suppository (25 mg total) rectally every 12 (twelve) hours as needed for nausea or vomiting. 6 suppository 0   prochlorperazine (COMPAZINE) 5 MG tablet Take 1 tablet (5 mg total) by mouth every 6 (six) hours as needed for nausea or vomiting. 30 tablet 0   propranolol (INDERAL) 20 MG tablet Take 1 tablet (20 mg total) by mouth 3 (three) times daily. (Patient taking differently: Take 20 mg by mouth at bedtime.) 90 tablet 11   propranolol 40 MG/5ML solution Take 5 mg by mouth every evening. As needed     Propylene Glycol (SYSTANE BALANCE OP) Place 2-3 drops into both eyes daily as needed (dry eyes).     Pseudoeph-Doxylamine-DM-APAP (NYQUIL PO) Take 2 tablets by mouth at bedtime as needed (pain).     atorvastatin (LIPITOR) 10 MG tablet Take 1 tablet (10 mg total) by mouth daily. (Patient not taking: Reported on 06/11/2023) 90 tablet 0   sucralfate (CARAFATE) 1 g tablet Take 1 tablet (1 g total) by mouth 4 (four) times daily before meals and nightly (Patient not taking: Reported on 05/19/2023) 120 tablet 11   VITAMIN E PO Take 1 tablet by mouth daily. (Patient not taking: Reported on 06/11/2023)     No current facility-administered medications for this visit.   Allergies:  Other   Social History: The patient  reports that he quit smoking about 33 years ago. His smoking use included cigarettes. He started smoking about 48 years ago. He has a 15 pack-year smoking history. He has never used smokeless tobacco. He reports that he does not currently use alcohol. He reports that he does not use drugs.   Family History: The patient's family history includes CAD in his father; Diabetes in his mother and another family member; Heart attack (age of onset: 47) in his father; Heart disease in an other family  member; Hypertension in his mother, sister, and another family member.   ROS:  Please see the history of present illness. Otherwise, complete review of systems is positive for none.  All other systems are reviewed and negative.   Physical Exam: VS:  BP 136/72 (BP Location: Right Arm)   Pulse 75   Ht 6\' 2"  (1.88 m)   Wt 198 lb 12.8 oz (90.2 kg)   SpO2 98%   BMI 25.52 kg/m , BMI Body mass index is 25.52 kg/m.  Wt Readings from Last 3 Encounters:  06/11/23 198 lb 12.8 oz (90.2 kg)  06/04/23 187 lb (84.8 kg)  06/04/23 186 lb (84.4 kg)    General: Patient appears comfortable at rest. HEENT: Conjunctiva and lids normal, oropharynx clear with moist mucosa. Neck: Supple, no elevated JVP or carotid bruits, no thyromegaly. Lungs: Clear to auscultation, nonlabored breathing at rest. Cardiac: Regular rate and rhythm, no S3 or significant systolic murmur, no pericardial  rub. Abdomen: Soft, nontender, no hepatomegaly, bowel sounds present, no guarding or rebound. Extremities: No pitting edema, distal pulses 2+. Skin: Warm and dry. Musculoskeletal: No kyphosis. Neuropsychiatric: Alert and oriented x3, affect grossly appropriate.  ECG:  An ECG dated 09/26/2022 was personally reviewed today and demonstrated:  NSR and RBBB  Recent Labwork: 10/03/2022: TSH 0.472 12/05/2022: Magnesium 2.0 04/11/2023: ALT 19; AST 19 06/04/2023: BUN 9; Creatinine, Ser 0.57; Hemoglobin 14.8; Platelets 193; Potassium 3.3; Sodium 135     Component Value Date/Time   CHOL 140 10/03/2022 0823   TRIG 113 10/03/2022 0823   HDL 24 (L) 10/03/2022 0823   CHOLHDL 5.8 10/03/2022 0823   VLDL 23 10/03/2022 0823   LDLCALC 93 10/03/2022 0823     Assessment and Plan: Patient is a 62 year old M known to have CAD s/p LAD PCI in 2022 with residual diagonal 90% stenosis on Plavix monotherapy and normal LVEF, atrial tachycardia, DM2, HLD, gastric bypass surgery history complicated by stricturing of gastrojejunal anastomosis (s/p AX  IOS cold stenting x 4), OSA on BiPAP presented to cardiology clinic for follow-up visit.  # Preop cardiac risk stratification for esophageal dilatation # Gastric bypass surgery history complicated by stricturing of gastrojejunal anastomosis (s/p AX IOS cold stenting x 4) -No angina, DOE.  Jogs couple of times a week with no symptoms of angina and DOE.  EKG shows normal sinus rhythm and RBBB, no ischemia.  Echocardiogram from 2022 showed low normal LVEF 50 to 55%, moderate LVH and mild dilatation of the ascending aorta 44 mm.  He underwent CTA chest/aorta in 03/2023 at [facility] that showed 41 mm aortic dilatation which could be normal for his height.  He is at a low risk for any perioperative cardiac complications.  No further cardiac workup is indicated prior to proceeding with the planned procedure.  Plavix can be held 5 to 7 days prior to the procedure.  In the meantime, need to be covered by aspirin 81 mg once daily, does not have to be held prior to the procedure.  After procedure, aspirin needs to be switched to Plavix.  # CAD s/p LAD PCI in 2022 with residual diagonal 90% stenosis on Plavix monotherapy and normal LVEF, currently angina free -Continue Plavix 75 mg once daily -Continue atorvastatin 10 mg nightly -SL NTG 0.4 mg as needed -ER precautions for chest pain provided.  # HLD, at goal -Continue atorvastatin 10 mg nightly, LDL 62 in 2024, goal LDL less than 70.  # Ascending aorta dilatation, 44 mm per echo from 2022 -CTA chest/aorta from 03/2023 at [facility] showed 41 mm aorta dilatation which could be normal for his height.  # Atrial tachycardia with breakthrough palpitations -Did not tolerate metoprolol, propranolol seemed to help his palpitations.  Currently takes propranolol only at nighttime.  Elevated heart rates in the day does not bother him.  I spent total duration of 30 minutes reviewing the prior imaging studies, notes, face-to-face discussion of his medical condition, evaluation,  management, ordered medications, documenting the findings in the note.   Disposition:  Follow up 1 year Signed, Jaryiah Mehlman Verne Spurr, MD, 06/11/2023 9:05 AM    [facility] at [facility] [address], [place], [place] [address]

## 2023-06-11 NOTE — Patient Instructions (Signed)
Medication Instructions:  Your physician recommends that you continue on your current medications as directed. Please refer to the Current Medication list given to you today.  *If you need a refill on your cardiac medications before your next appointment, please call your pharmacy*   Lab Work: None If you have labs (blood work) drawn today and your tests are completely normal, you will receive your results only by: MyChart Message (if you have MyChart) OR A paper copy in the mail If you have any lab test that is abnormal or we need to change your treatment, we will call you to review the results.   Testing/Procedures: None   Follow-Up: At [facility], you and your health needs are our priority.  As part of our continuing mission to provide you with exceptional heart care, we have created designated Provider Care Teams.  These Care Teams include your primary Cardiologist (physician) and Advanced Practice Providers (APPs -  Physician Assistants and Nurse Practitioners) who all work together to provide you with the care you need, when you need it.  We recommend signing up for the patient portal called "MyChart".  Sign up information is provided on this After Visit Summary.  MyChart is used to connect with patients for Virtual Visits (Telemedicine).  Patients are able to view lab/test results, encounter notes, upcoming appointments, etc.  Non-urgent messages can be sent to your provider as well.   To learn more about what you can do with MyChart, go to [email or web address].    Your next appointment:   1 year(s)  Provider:   Vishnu Mallipeddi, MD    Other Instructions    

## 2023-06-16 ENCOUNTER — Ambulatory Visit: Payer: Managed Care, Other (non HMO) | Admitting: Gastroenterology

## 2023-06-16 ENCOUNTER — Other Ambulatory Visit (HOSPITAL_COMMUNITY): Payer: Self-pay

## 2023-06-16 ENCOUNTER — Other Ambulatory Visit: Payer: Self-pay

## 2023-06-16 ENCOUNTER — Encounter: Payer: Self-pay | Admitting: Gastroenterology

## 2023-06-16 VITALS — BP 125/95 | HR 83 | Temp 98.7°F | Resp 14 | Ht 74.0 in | Wt 200.0 lb

## 2023-06-16 DIAGNOSIS — J690 Pneumonitis due to inhalation of food and vomit: Secondary | ICD-10-CM

## 2023-06-16 DIAGNOSIS — K9189 Other postprocedural complications and disorders of digestive system: Secondary | ICD-10-CM

## 2023-06-16 DIAGNOSIS — K222 Esophageal obstruction: Secondary | ICD-10-CM

## 2023-06-16 DIAGNOSIS — R1310 Dysphagia, unspecified: Secondary | ICD-10-CM

## 2023-06-16 MED ORDER — AMOXICILLIN-POT CLAVULANATE 875-125 MG PO TABS
1.0000 | ORAL_TABLET | Freq: Two times a day (BID) | ORAL | 0 refills | Status: DC
Start: 2023-06-16 — End: 2023-07-21
  Filled 2023-06-16 (×2): qty 20, 10d supply, fill #0

## 2023-06-16 MED ORDER — SODIUM CHLORIDE 0.9 % IV SOLN: 500.0000 mL | INTRAVENOUS | Status: AC

## 2023-06-16 MED ORDER — RABEPRAZOLE SODIUM 20 MG PO TBEC
20.0000 mg | DELAYED_RELEASE_TABLET | Freq: Every day | ORAL | 3 refills | Status: DC
Start: 2023-06-16 — End: 2023-07-21
  Filled 2023-06-16 (×2): qty 90, 90d supply, fill #0

## 2023-06-16 NOTE — Progress Notes (Unsigned)
GASTROENTEROLOGY PROCEDURE H&P NOTE   Primary Care Physician: [facility]  HPI: Hector Curry is a 62 y.o. male who presents for EGD for followup of previous EG stricture for further dilation.  Past Medical History:  Diagnosis Date   Allergic rhinitis    Atrial tachycardia (HCC)    CAD (coronary artery disease)    a. 05/08/2015 Cath: no significant CAD, LVEF nl-->Med; b. 07/2017 MV: attenuation artifact, no ischemia, EF 65%-->Low risk; c. 03/2021 NSTEMI/PCI: LM nl, LAD 80p (3.0x18 Onyx Frontier DES), D1 90 (too small for PCI), LCX nl, RCA nl.   Diabetes mellitus without complication (HCC)    Diastolic dysfunction    a. 04/2015 Echo: EF 60-65%; b. 05/2016 Echo: EF 60-65%, GrI DD; c. 07/2017 Echo: EF 55-60%, Ao root 42mm; d. 03/2019 Echo: EF 55-60%, Ao root/Asc Ao 42mm; e. 03/2021 Echo: EF 50-55%, apical HK, mod asymm basal-septal hypertrophy. Nl RV fxn. Asc Ao 44mm.   Dilated aortic root (HCC)    a. 07/2017 Echo: 42mm Ao root - mildly dil; b. 03/2019 Echo: Ao root 42mm; c. 03/2021 Echo: Asc Ao 44mm.   Diverticulosis 10/03/2014   Dysrhythmia    Esophageal stricture    a.  In setting of lap band June 2022-stricture at the GJ anastomosis requiring gastrostomy tube; b. 05/2021 s/p esoph stenting (15mm); c. 07/2021 s/p esoph stenting (20mm).   Family history of premature CAD    a. father passed from MI at 42   GERD (gastroesophageal reflux disease)    GIB (gastrointestinal bleeding)    a. 07/2021 following esoph stenting.   Hemorrhoids    a. internal hemorrhoids s/p surgery 1999   History of kidney stones    History of tobacco abuse    Hyperlipidemia    Hyperplastic colon polyp 10/03/2014   a. x 2    Hypertension    Inflammatory arthritis    a. CCP antibodies & x-rays negative. Rheumatoid factor 14, felt to be crystaline over RA or psoriatic   Iron deficiency anemia 02/05/2022   Morbid obesity (HCC)    a. s/p LAP-BAND - complicated by esoph stricture.   Myocardial  infarction [facility])    NSTEMI   Nausea and vomiting 04/11/2023   OSA (obstructive sleep apnea)    a. on CPAP   Osteoarthritis    PSVT (paroxysmal supraventricular tachycardia) (HCC)    a. 48 hr Holter 04/2015: NSR w/ rare PVC, short runs of narrow complex tachycardiac, possible atrial tach, longest run 7 beats, PACs noted (2% of all beats 3600 total) they did not seem to correlate w/ significant arrythmia; b. 08/2017 Event monitor: no significant arrhythmias.   Past Surgical History:  Procedure Laterality Date   BALLOON DILATION N/A 08/14/2021   Procedure: BALLOON DILATION;  Surgeon: Mansouraty, Netty Starring., MD;  Location: [facility] ENDOSCOPY;  Service: Gastroenterology;  Laterality: N/A;   BALLOON DILATION N/A 02/20/2022   Procedure: BALLOON DILATION;  Surgeon: Rachael Fee, MD;  Location: [facility]OSCOPY;  Service: Gastroenterology;  Laterality: N/A;   BALLOON DILATION N/A 03/27/2022   Procedure: BALLOON DILATION;  Surgeon: Meridee Score Netty Starring., MD;  Location: [facility] ENDOSCOPY;  Service: Gastroenterology;  Laterality: N/A;   BARIATRIC SURGERY  01/2021   lap band    BILIARY STENT PLACEMENT N/A 08/14/2021   Procedure: AXIOS STENT PLACEMENT;  Surgeon: Lemar Lofty., MD;  Location: [facility] ENDOSCOPY;  Service: Gastroenterology;  Laterality: N/A;   BILIARY STENT PLACEMENT N/A 10/16/2022   Procedure: STENT PLACEMENT;  Surgeon: Lemar Lofty.,  MD;  Location: [facility] ENDOSCOPY;  Service: Gastroenterology;  Laterality: N/A;   BILIARY STENT PLACEMENT N/A 04/09/2023   Procedure: AXIOS STENT PLACEMENT;  Surgeon: Lemar Lofty., MD;  Location: [facility] ENDOSCOPY;  Service: Gastroenterology;  Laterality: N/A;   BIOPSY  07/08/2021   Procedure: BIOPSY;  Surgeon: Meridee Score Netty Starring., MD;  Location: [facility] ENDOSCOPY;  Service: Gastroenterology;;   BIOPSY  08/14/2021   Procedure: BIOPSY;  Surgeon: Lemar Lofty., MD;  Location: [facility]OSCOPY;  Service: Gastroenterology;;   BIOPSY  10/16/2022    Procedure: BIOPSY;  Surgeon: Lemar Lofty., MD;  Location: [facility]OSCOPY;  Service: Gastroenterology;;   CARDIAC CATHETERIZATION N/A 05/08/2015   Procedure: Left Heart Cath and Coronary Angiography;  Surgeon: Corky Crafts, MD;  Location: [facility] INVASIVE CV LAB;  Service: Cardiovascular;  Laterality: N/A;   CARPAL TUNNEL RELEASE Bilateral    CHOLECYSTECTOMY     COLONOSCOPY  06/27/2005   COLONOSCOPY  10/03/2014   CORONARY STENT INTERVENTION N/A 04/15/2021   Procedure: CORONARY STENT INTERVENTION;  Surgeon: Runell Gess, MD;  Location: [facility] INVASIVE CV LAB;  Service: Cardiovascular;  Laterality: N/A;   DUODENAL STENT PLACEMENT N/A 11/18/2021   Procedure: DUODENAL STENT PLACEMENT;  Surgeon: Meridee Score Netty Starring., MD;  Location: [facility]OSCOPY;  Service: Gastroenterology;  Laterality: N/A;  axios placed at GJ anastamosis   DUODENAL STENT PLACEMENT  03/27/2022   Procedure: GASTRIC STENT PLACEMENT;  Surgeon: Meridee Score Netty Starring., MD;  Location: [facility] ENDOSCOPY;  Service: Gastroenterology;;   ESOPHAGEAL DILATION  05/27/2021   Procedure: ESOPHAGEAL DILATION;  Surgeon: Lemar Lofty., MD;  Location: [facility]OSCOPY;  Service: Gastroenterology;;   ESOPHAGEAL DILATION  01/23/2022   Procedure: ESOPHAGEAL DILATION;  Surgeon: Lemar Lofty., MD;  Location: [facility] ENDOSCOPY;  Service: Gastroenterology;;   ESOPHAGEAL DILATION  04/09/2023   Procedure: ESOPHAGEAL DILATION;  Surgeon: Lemar Lofty., MD;  Location: [facility] ENDOSCOPY;  Service: Gastroenterology;;   ESOPHAGEAL STENT PLACEMENT N/A 05/27/2021   Procedure: ESOPHAGEAL STENT PLACEMENT;  Surgeon: Lemar Lofty., MD;  Location: [facility];  Service: Gastroenterology;  Laterality: N/A;   ESOPHAGOGASTRODUODENOSCOPY  06/27/2005   ESOPHAGOGASTRODUODENOSCOPY N/A 03/19/2021   Procedure: ESOPHAGOGASTRODUODENOSCOPY (EGD);  Surgeon: Sheliah Hatch, De Blanch, MD;  Location: [facility] ENDOSCOPY;  Service: General;  Laterality: N/A;    ESOPHAGOGASTRODUODENOSCOPY N/A 08/16/2021   Procedure: ESOPHAGOGASTRODUODENOSCOPY (EGD);  Surgeon: Lemar Lofty., MD;  Location: [facility] ENDOSCOPY;  Service: Gastroenterology;  Laterality: N/A;   ESOPHAGOGASTRODUODENOSCOPY (EGD) WITH PROPOFOL N/A 05/27/2021   Procedure: ESOPHAGOGASTRODUODENOSCOPY (EGD) WITH PROPOFOL;  Surgeon: Meridee Score Netty Starring., MD;  Location: [facility]OSCOPY;  Service: Gastroenterology;  Laterality: N/A;   ESOPHAGOGASTRODUODENOSCOPY (EGD) WITH PROPOFOL N/A 07/08/2021   Procedure: ESOPHAGOGASTRODUODENOSCOPY (EGD) WITH PROPOFOL;  Surgeon: Meridee Score Netty Starring., MD;  Location: [facility] ENDOSCOPY;  Service: Gastroenterology;  Laterality: N/A;  fluoro   ESOPHAGOGASTRODUODENOSCOPY (EGD) WITH PROPOFOL N/A 08/14/2021   Procedure: ESOPHAGOGASTRODUODENOSCOPY (EGD) WITH PROPOFOL;  Surgeon: Meridee Score Netty Starring., MD;  Location: [facility] ENDOSCOPY;  Service: Gastroenterology;  Laterality: N/A;  fluoro Axios stent (20 mm)   ESOPHAGOGASTRODUODENOSCOPY (EGD) WITH PROPOFOL N/A 10/16/2021   Procedure: ESOPHAGOGASTRODUODENOSCOPY (EGD) WITH PROPOFOL;  Surgeon: Meridee Score Netty Starring., MD;  Location: [facility];  Service: Gastroenterology;  Laterality: N/A;  axios stent pull fluoro   ESOPHAGOGASTRODUODENOSCOPY (EGD) WITH PROPOFOL N/A 11/18/2021   Procedure: ESOPHAGOGASTRODUODENOSCOPY (EGD) WITH PROPOFOL;  Surgeon: Meridee Score Netty Starring., MD;  Location: [facility] ENDOSCOPY;  Service: Gastroenterology;  Laterality: N/A;   ESOPHAGOGASTRODUODENOSCOPY (EGD) WITH PROPOFOL N/A 01/23/2022   Procedure: ESOPHAGOGASTRODUODENOSCOPY (EGD) WITH PROPOFOL;  Surgeon: Corliss Parish  Montez Hageman., MD;  Location: [facility] ENDOSCOPY;  Service: Gastroenterology;  Laterality: N/A;   ESOPHAGOGASTRODUODENOSCOPY (EGD) WITH PROPOFOL N/A 02/20/2022   Procedure: ESOPHAGOGASTRODUODENOSCOPY (EGD) WITH PROPOFOL;  Surgeon: Rachael Fee, MD;  Location: [facility] ENDOS[facility];  Service: Gastroenterology;  Laterality: N/A;   ESOPHAGOGASTRODUODENOSCOPY (EGD)  WITH PROPOFOL N/A 03/27/2022   Procedure: ESOPHAGOGASTRODUODENOSCOPY (EGD) WITH PROPOFOL;  Surgeon: Meridee Score Netty Starring., MD;  Location: [facility] ENDOSCOPY;  Service: Gastroenterology;  Laterality: N/A;   ESOPHAGOGASTRODUODENOSCOPY (EGD) WITH PROPOFOL N/A 08/14/2022   Procedure: ESOPHAGOGASTRODUODENOSCOPY (EGD) WITH PROPOFOL;  Surgeon: Meridee Score Netty Starring., MD;  Location: [facility];  Service: Gastroenterology;  Laterality: N/A;   ESOPHAGOGASTRODUODENOSCOPY (EGD) WITH PROPOFOL N/A 10/16/2022   Procedure: ESOPHAGOGASTRODUODENOSCOPY (EGD) WITH PROPOFOL;  Surgeon: Meridee Score Netty Starring., MD;  Location: [facility]OS[facility];  Service: Gastroenterology;  Laterality: N/A;   ESOPHAGOGASTRODUODENOSCOPY (EGD) WITH PROPOFOL N/A 11/28/2022   Procedure: ESOPHAGOGASTRODUODENOSCOPY (EGD) WITH PROPOFOL WITH AXIOS STENT REMOVAL;  Surgeon: Meridee Score Netty Starring., MD;  Location: [facility] ENDOSCOPY;  Service: Gastroenterology;  Laterality: N/A;   ESOPHAGOGASTRODUODENOSCOPY (EGD) WITH PROPOFOL N/A 04/09/2023   Procedure: ESOPHAGOGASTRODUODENOSCOPY (EGD) WITH PROPOFOL;  Surgeon: Meridee Score Netty Starring., MD;  Location: [facility] ENDOS[facility];  Service: Gastroenterology;  Laterality: N/A;  dilation - axios stent   ESOPHAGOGASTRODUODENOSCOPY (EGD) WITH PROPOFOL N/A 04/11/2023   Procedure: ESOPHAGOGASTRODUODENOSCOPY (EGD) WITH PROPOFOL;  Surgeon: Beverley Fiedler, MD;  Location: [facility] ENDOSCOPY;  Service: Gastroenterology;  Laterality: N/A;   ESOPHAGOGASTRODUODENOSCOPY (EGD) WITH PROPOFOL N/A 04/30/2023   Procedure: ESOPHAGOGASTRODUODENOSCOPY (EGD) WITH PROPOFOL;  Surgeon: Meridee Score Netty Starring., MD;  Location: [facility] ENDOSCOPY;  Service: Gastroenterology;  Laterality: N/A;  axios stent pull   FLEXIBLE SIGMOIDOSCOPY N/A 08/16/2021   Procedure: FLEXIBLE SIGMOIDOSCOPY;  Surgeon: Meridee Score Netty Starring., MD;  Location: [facility] ENDOSCOPY;  Service: Gastroenterology;  Laterality: N/A;   FOREIGN BODY REMOVAL  02/20/2022   Procedure: FOREIGN BODY REMOVAL;  Surgeon:  Rachael Fee, MD;  Location: [facility] ENDOSCOPY;  Service: Gastroenterology;;   FOREIGN BODY REMOVAL ESOPHAGEAL  03/27/2022   Procedure: REMOVAL FOREIGN BODY;  Surgeon: Meridee Score Netty Starring., MD;  Location: [facility] ENDOSCOPY;  Service: Gastroenterology;;   GASTRIC ROUX-EN-Y N/A 02/18/2021   Procedure: LAPAROSCOPIC ROUX-EN-Y GASTRIC BYPASS WITH UPPER ENDOSCOPY,;  Surgeon: Sheliah Hatch De Blanch, MD;  Location: [facility] ORS;  Service: General;  Laterality: N/A;   GASTRIC ROUX-EN-Y N/A 12/02/2022   Procedure: LAPAROSCOPIC converted to OPEN GASTRIC BYPASS REVERSAL, TAKEDOWN OF GASTROJEJUNAL ANASTOMOSIS, GASTRO GASTRIC ANASTOMOSIS WITH UPPER ENDOSCOPY 1. Laparoscopic lysis of adhesions 2. Partial gastrectomy 3. Small bowel resection 4. Creation of esophagus to gastric anastomosis 5. Creation of gastrostomy;  Surgeon: Kinsinger, De Blanch, MD;  Location: [facility] ORS;  Service: General;  Laterality:    GASTROINTESTINAL STENT REMOVAL  01/23/2022   Procedure: Christy Sartorius REMOVAL;  Surgeon: Lemar Lofty., MD;  Location: [facility] ENDOSCOPY;  Service: Gastroenterology;;   GASTROINTESTINAL STENT REMOVAL N/A 04/30/2023   Procedure: GASTROINTESTINAL STENT REMOVAL;  Surgeon: Lemar Lofty., MD;  Location: [facility] ENDOS[facility];  Service: Gastroenterology;  Laterality: N/A;   GASTROSTOMY N/A 12/02/2022   Procedure: INSERTION OF GASTROSTOMY TUBE;  Surgeon: Sheliah Hatch De Blanch, MD;  Location: [facility] ORS;  Service: General;  Laterality: N/A;   HEMORRHOID SURGERY  08/25/1997   IR GJ TUBE CHANGE  03/27/2021   LAPAROSCOPIC INSERTION GASTROSTOMY TUBE N/A 03/20/2021   Procedure: LAPAROSCOPIC INSERTION GASTROSTOMY TUBE;  Surgeon: Rodman Pickle, MD;  Location: [facility] ORS;  Service: General;  Laterality: N/A;   LEFT HEART CATH AND CORONARY ANGIOGRAPHY N/A 04/15/2021   Procedure: LEFT HEART CATH AND CORONARY ANGIOGRAPHY;  Surgeon: Runell Gess, MD;  Location: [facility] INVASIVE CV LAB;  Service: Cardiovascular;  Laterality: N/A;   MOUTH  SURGERY     removed area which was benign   SCLEROTHERAPY  04/30/2023   Procedure: SCLEROTHERAPY;  Surgeon: Mansouraty, Netty Starring., MD;  Location: [facility] ENDOSCOPY;  Service: Gastroenterology;;   Francine Graven REMOVAL  07/08/2021   Procedure: STENT REMOVAL;  Surgeon: Lemar Lofty., MD;  Location: [facility] ENDOSCOPY;  Service: Gastroenterology;;   Francine Graven REMOVAL  10/16/2021   Procedure: STENT REMOVAL;  Surgeon: Lemar Lofty., MD;  Location: [facility] ENDOSCOPY;  Service: Gastroenterology;;   Francine Graven REMOVAL  08/14/2022   Procedure: STENT REMOVAL;  Surgeon: Lemar Lofty., MD;  Location: [facility] ENDOSCOPY;  Service: Gastroenterology;;   Francine Graven REMOVAL  11/28/2022   Procedure: STENT REMOVAL;  Surgeon: Lemar Lofty., MD;  Location: [facility] ENDOSCOPY;  Service: Gastroenterology;;   Francine Graven REMOVAL  04/11/2023   Procedure: STENT REMOVAL;  Surgeon: Beverley Fiedler, MD;  Location: [facility] ENDOSCOPY;  Service: Gastroenterology;;   STERIOD INJECTION  01/23/2022   Procedure: STEROID INJECTION;  Surgeon: Lemar Lofty., MD;  Location: [facility] ENDOSCOPY;  Service: Gastroenterology;;   SUBMUCOSAL INJECTION  11/18/2021   Procedure: SUBMUCOSAL INJECTION;  Surgeon: Lemar Lofty., MD;  Location: [facility] ENDOSCOPY;  Service: Gastroenterology;;   SUBMUCOSAL INJECTION  08/14/2022   Procedure: SUBMUCOSAL INJECTION;  Surgeon: Lemar Lofty., MD;  Location: [facility] ENDOSCOPY;  Service: Gastroenterology;;   TONSILLECTOMY     UPPER GI ENDOSCOPY N/A 03/20/2021   Procedure: UPPER GI ENDOSCOPY;  Surgeon: Rodman Pickle, MD;  Location: [facility] ORS;  Service: General;  Laterality: N/A;   Current Outpatient Medications  Medication Sig Dispense Refill   atorvastatin (LIPITOR) 10 MG tablet Take 1 tablet (10 mg total) by mouth daily. (Patient not taking: Reported on 06/11/2023) 90 tablet 0   clopidogrel (PLAVIX) 75 MG tablet Take 1 tablet (75 mg total) by mouth daily. 90 tablet 1   Continuous Glucose Sensor (FREESTYLE  LIBRE 14 DAY SENSOR) MISC Insert 1 sensor to the back of the arm as directed every 14 days for continuous glucose monitoring 6 each 1   doxycycline (VIBRAMYCIN) 100 MG capsule Take 1 capsule (100 mg total) by mouth 2 (two) times daily. 20 capsule 0   fluticasone (FLONASE) 50 MCG/ACT nasal spray Place 2-3 sprays into both nostrils daily as needed for allergies or rhinitis.     insulin lispro (HUMALOG) 100 UNIT/ML KwikPen Inject subcutaneously 3 (three) times daily with meals Per sliding scale (Patient taking differently: Inject 3-7 Units into the skin daily as needed (For high blood sugar).) 15 mL 1   Insulin Pen Needle (UNIFINE PENTIPS) 32G X 4 MM MISC Use as needed as directed 100 each 3   mometasone (ELOCON) 0.1 % lotion Apply to affected areas once daily (Patient taking differently: Apply 1 Application topically as needed (psoriasis).) 60 mL 5   Multiple Vitamin (MULTI-VITAMIN DAILY PO) Take 1 patch by mouth daily.     nitroGLYCERIN (NITROSTAT) 0.4 MG SL tablet Place 1 tablet (0.4 mg total) under the tongue every 5 (five) minutes as needed for chest pain. 25 tablet 3   ondansetron (ZOFRAN-ODT) 4 MG disintegrating tablet Take 1 tablet (4 mg total) by mouth every 6 (six) hours as needed for nausea or vomiting. 20 tablet 0   OVER THE COUNTER MEDICATION Apply 1 Application topically daily at 6 (six) AM. Medication: Bariatric patch     pantoprazole (PROTONIX) 40 MG tablet Take 1 tablet (40 mg  total) by mouth 2 (two) times daily before a meal. 60 tablet 3   prochlorperazine (COMPAZINE) 25 MG suppository Place 1 suppository (25 mg total) rectally every 12 (twelve) hours as needed for nausea or vomiting. 6 suppository 0   prochlorperazine (COMPAZINE) 5 MG tablet Take 1 tablet (5 mg total) by mouth every 6 (six) hours as needed for nausea or vomiting. 30 tablet 0   propranolol (INDERAL) 20 MG tablet Take 1 tablet (20 mg total) by mouth 3 (three) times daily. (Patient taking differently: Take 20 mg by mouth at  bedtime.) 90 tablet 11   propranolol 40 MG/5ML solution Take 5 mg by mouth every evening. As needed     Propylene Glycol (SYSTANE BALANCE OP) Place 2-3 drops into both eyes daily as needed (dry eyes).     Pseudoeph-Doxylamine-DM-APAP (NYQUIL PO) Take 2 tablets by mouth at bedtime as needed (pain).     VITAMIN E PO Take 1 tablet by mouth daily. (Patient not taking: Reported on 06/11/2023)     No current facility-administered medications for this visit.    Current Outpatient Medications:    atorvastatin (LIPITOR) 10 MG tablet, Take 1 tablet (10 mg total) by mouth daily. (Patient not taking: Reported on 06/11/2023), Disp: 90 tablet, Rfl: 0   clopidogrel (PLAVIX) 75 MG tablet, Take 1 tablet (75 mg total) by mouth daily., Disp: 90 tablet, Rfl: 1   Continuous Glucose Sensor (FREESTYLE LIBRE 14 DAY SENSOR) MISC, Insert 1 sensor to the back of the arm as directed every 14 days for continuous glucose monitoring, Disp: 6 each, Rfl: 1   doxycycline (VIBRAMYCIN) 100 MG capsule, Take 1 capsule (100 mg total) by mouth 2 (two) times daily., Disp: 20 capsule, Rfl: 0   fluticasone (FLONASE) 50 MCG/ACT nasal spray, Place 2-3 sprays into both nostrils daily as needed for allergies or rhinitis., Disp: , Rfl:    insulin lispro (HUMALOG) 100 UNIT/ML KwikPen, Inject subcutaneously 3 (three) times daily with meals Per sliding scale (Patient taking differently: Inject 3-7 Units into the skin daily as needed (For high blood sugar).), Disp: 15 mL, Rfl: 1   Insulin Pen Needle (UNIFINE PENTIPS) 32G X 4 MM MISC, Use as needed as directed, Disp: 100 each, Rfl: 3   mometasone (ELOCON) 0.1 % lotion, Apply to affected areas once daily (Patient taking differently: Apply 1 Application topically as needed (psoriasis).), Disp: 60 mL, Rfl: 5   Multiple Vitamin (MULTI-VITAMIN DAILY PO), Take 1 patch by mouth daily., Disp: , Rfl:    nitroGLYCERIN (NITROSTAT) 0.4 MG SL tablet, Place 1 tablet (0.4 mg total) under the tongue every 5 (five)  minutes as needed for chest pain., Disp: 25 tablet, Rfl: 3   ondansetron (ZOFRAN-ODT) 4 MG disintegrating tablet, Take 1 tablet (4 mg total) by mouth every 6 (six) hours as needed for nausea or vomiting., Disp: 20 tablet, Rfl: 0   OVER THE COUNTER MEDICATION, Apply 1 Application topically daily at 6 (six) AM. Medication: Bariatric patch, Disp: , Rfl:    pantoprazole (PROTONIX) 40 MG tablet, Take 1 tablet (40 mg total) by mouth 2 (two) times daily before a meal., Disp: 60 tablet, Rfl: 3   prochlorperazine (COMPAZINE) 25 MG suppository, Place 1 suppository (25 mg total) rectally every 12 (twelve) hours as needed for nausea or vomiting., Disp: 6 suppository, Rfl: 0   prochlorperazine (COMPAZINE) 5 MG tablet, Take 1 tablet (5 mg total) by mouth every 6 (six) hours as needed for nausea or vomiting., Disp: 30 tablet, Rfl: 0  propranolol (INDERAL) 20 MG tablet, Take 1 tablet (20 mg total) by mouth 3 (three) times daily. (Patient taking differently: Take 20 mg by mouth at bedtime.), Disp: 90 tablet, Rfl: 11   propranolol 40 MG/5ML solution, Take 5 mg by mouth every evening. As needed, Disp: , Rfl:    Propylene Glycol (SYSTANE BALANCE OP), Place 2-3 drops into both eyes daily as needed (dry eyes)., Disp: , Rfl:    Pseudoeph-Doxylamine-DM-APAP (NYQUIL PO), Take 2 tablets by mouth at bedtime as needed (pain)., Disp: , Rfl:    VITAMIN E PO, Take 1 tablet by mouth daily. (Patient not taking: Reported on 06/11/2023), Disp: , Rfl:  Allergies  Allergen Reactions   Other Itching and Other (See Comments)    Cats Wheezing, runny nose/eyes, congestion    Family History  Problem Relation Age of Onset   Hypertension Mother    Diabetes Mother    Heart attack Father 64   CAD Father    Hypertension Sister    Diabetes Other    Hypertension Other    Heart disease Other    Colon cancer Neg Hx    Esophageal cancer Neg Hx    Pancreatic cancer Neg Hx    Stomach cancer Neg Hx    Liver disease Neg Hx    Inflammatory  bowel disease Neg Hx    Rectal cancer Neg Hx    Social History   Socioeconomic History   Marital status: Married    Spouse name: Not on file   Number of children: 3   Years of education: Not on file   Highest education level: Not on file  Occupational History   Occupation: IT IT trainer  Tobacco Use   Smoking status: Former    Current packs/day: 0.00    Average packs/day: 1 pack/day for 15.0 years (15.0 ttl pk-yrs)    Types: Cigarettes    Start date: 08/05/1974    Quit date: 08/05/1989    Years since quitting: 33.8   Smokeless tobacco: Never  Vaping Use   Vaping status: Never Used  Substance and Sexual Activity   Alcohol use: Not Currently    Comment: occ   Drug use: Never   Sexual activity: Not Currently  Other Topics Concern   Not on file  Social History Narrative   Not on file   Social Determinants of Health   Financial Resource Strain: Low Risk  (06/12/2023)   Received from [facility]   Overall Financial Resource Strain (CARDIA)    Difficulty of Paying Living Expenses: Not hard at all  Food Insecurity: No Food Insecurity (06/12/2023)   Received from [facility]   Hunger Vital Sign    Worried About Running Out of Food in the Last Year: Never true    Ran Out of Food in the Last Year: Never true  Transportation Needs: No Transportation Needs (06/12/2023)   Received from [facility] - Transportation    In the past 12 months, has lack of transportation kept you from medical appointments or from getting medications?: No    Lack of Transportation (Non-Medical): No  Physical Activity: Not on file  Stress: Not on file  Social Connections: Unknown (04/14/2023)   Received from [facility]   Social Network    Social Network: Not on file  Intimate Partner Violence: Unknown (04/14/2023)   Received from [facility]   HITS    Physically Hurt: Not on file    Insult  or Talk Down To: Not on file     Threaten Physical Harm: Not on file    Scream or Curse: Not on file    Physical Exam: There were no vitals filed for this visit. There is no height or weight on file to calculate BMI. GEN: NAD EYE: Sclerae anicteric ENT: MMM CV: Non-tachycardic GI: Soft, NT/ND NEURO:  Alert & Oriented x 3  Lab Results: No results for input(s): "WBC", "HGB", "HCT", "PLT" in the last 72 hours. BMET No results for input(s): "NA", "K", "CL", "CO2", "GLUCOSE", "BUN", "CREATININE", "CALCIUM" in the last 72 hours. LFT No results for input(s): "PROT", "ALBUMIN", "AST", "ALT", "ALKPHOS", "BILITOT", "BILIDIR", "IBILI" in the last 72 hours. PT/INR No results for input(s): "LABPROT", "INR" in the last 72 hours.   Impression / Plan: This is a 62 y.o.male who presents for EGD for followup of previous EG stricture for further dilation.  The risks and benefits of endoscopic evaluation/treatment were discussed with the patient and/or family; these include but are not limited to the risk of perforation, infection, bleeding, missed lesions, lack of diagnosis, severe illness requiring hospitalization, as well as anesthesia and sedation related illnesses.  The patient's history has been reviewed, patient examined, no change in status, and deemed stable for procedure.  The patient and/or family is agreeable to proceed.    Corliss Parish, MD [facility] Advanced [facility]oscopy Office # [phone]

## 2023-06-16 NOTE — Progress Notes (Unsigned)
Per Dr Meridee Score- ok to use same Plavix hold as from the instructions on 01-26-23

## 2023-06-16 NOTE — Patient Instructions (Signed)
Change PPI to Aciphex 20mg  BID  Carafate twice daily as slurry.  Continue present medications. Start Plavix on 06/18/2023  Resume previous diet.    YOU HAD AN ENDOSCOPIC PROCEDURE TODAY AT THE [facility]:   Refer to the procedure report that was given to you for any specific questions about what was found during the examination.  If the procedure report does not answer your questions, please call your gastroenterologist to clarify.  If you requested that your care partner not be given the details of your procedure findings, then the procedure report has been included in a sealed envelope for you to review at your convenience later.  YOU SHOULD EXPECT: Some feelings of bloating in the abdomen. Passage of more gas than usual.  Walking can help get rid of the air that was put into your GI tract during the procedure and reduce the bloating. If you had a lower endoscopy (such as a colonoscopy or flexible sigmoidoscopy) you may notice spotting of blood in your stool or on the toilet paper. If you underwent a bowel prep for your procedure, you may not have a normal bowel movement for a few days.  Please Note:  You might notice some irritation and congestion in your nose or some drainage.  This is from the oxygen used during your procedure.  There is no need for concern and it should clear up in a day or so.  SYMPTOMS TO REPORT IMMEDIATELY:   Following upper endoscopy (EGD)  Vomiting of blood or coffee ground material  New chest pain or pain under the shoulder blades  Painful or persistently difficult swallowing  New shortness of breath  Fever of 100F or higher  Black, tarry-looking stools  For urgent or emergent issues, a gastroenterologist can be reached at any hour by calling [phone]. Do not use MyChart messaging for urgent concerns.    DIET:  We do recommend a small meal at first, but then you may proceed to your regular diet.  Drink plenty of fluids but you should  avoid alcoholic beverages for 24 hours.  ACTIVITY:  You should plan to take it easy for the rest of today and you should NOT DRIVE or use heavy machinery until tomorrow (because of the sedation medicines used during the test).    FOLLOW UP: Our staff will call the number listed on your records the next business day following your procedure.  We will call around 7:15- 8:00 am to check on you and address any questions or concerns that you may have regarding the information given to you following your procedure. If we do not reach you, we will leave a message.     If any biopsies were taken you will be contacted by phone or by letter within the next 1-3 weeks.  Please call us at [phone] if you have not heard about the biopsies in 3 weeks.    SIGNATURES/CONFIDENTIALITY: You and/or your care partner have signed paperwork which will be entered into your electronic medical record.  These signatures attest to the fact that that the information above on your After Visit Summary has been reviewed and is understood.  Full responsibility of the confidentiality of this discharge information lies with you and/or your care-partner.

## 2023-06-16 NOTE — Progress Notes (Unsigned)
Called to room to assist during endoscopic procedure.  Patient ID and intended procedure confirmed with present staff. Received instructions for my participation in the procedure from the performing physician.  

## 2023-06-16 NOTE — Op Note (Signed)
[facility] Patient Name: Hector Curry Procedure Date: 06/16/2023 3:21 PM MRN: [id number] Endoscopist: Corliss Parish , MD, [id number] Age: 62 Referring MD:  Date of Birth: 10-02-1960 Gender: Male Account #: [id number] Procedure:                Upper GI endoscopy Indications:              Dysphagia, Heartburn, Exclusion of                            gastro-esophageal reflux disease, Stricture of the                            esophagus, For therapy of esophageal stricture Medicines:                Monitored Anesthesia Care Procedure:                Pre-Anesthesia Assessment:                           - Prior to the procedure, a History and Physical                            was performed, and patient medications and                            allergies were reviewed. The patient's tolerance of                            previous anesthesia was also reviewed. The risks                            and benefits of the procedure and the sedation                            options and risks were discussed with the patient.                            All questions were answered, and informed consent                            was obtained. Prior Anticoagulants: The patient has                            taken Plavix (clopidogrel), last dose was 5 days                            prior to procedure. ASA Grade Assessment: III - A                            patient with severe systemic disease. After                            reviewing the risks and benefits, the patient was  deemed in satisfactory condition to undergo the                            procedure.                           After obtaining informed consent, the endoscope was                            passed under direct vision. Throughout the                            procedure, the patient's blood pressure, pulse, and                            oxygen saturations were monitored  continuously. The                            Olympus Scope SN [id number] was introduced through the                            mouth, and advanced to the second part of duodenum.                            The upper GI endoscopy was accomplished without                            difficulty. The patient tolerated the procedure. Scope In: Scope Out: Findings:                 No gross lesions were noted in the entire esophagus.                           One benign-appearing, intrinsic moderate                            (circumferential scarring or stenosis; an endoscope                            may pass) stenosis was found at the esophageal                            anastomosis. This stenosis measured 1.3 cm (inner                            diameter) x 1 cm (in length). The stenosis was                            traversed. A guidewire was placed and the scope was                            withdrawn. Dilation was performed with a Savary  dilator with no resistance at 14 mm and mild                            resistance at 16 mm and 17 mm. The dilation site                            was examined following endoscope reinsertion and                            showed moderate mucosal disruption, moderate                            improvement in luminal narrowing and no                            perforation. Area was successfully injected with 4                            mL of triamcinolone (40 mg/mL) for attempt at                            decreasing recurrence of stenosis.                           A medium amount of food (residue) was found in the                            entire examined stomach. Suction via Endoscope was                            performed.                           No other gross lesions were noted in the entire                            examined stomach.                           No gross lesions were noted in the duodenal bulb,                             in the first portion of the duodenum and in the                            second portion of the duodenum. Complications:            No immediate complications. Estimated Blood Loss:     Estimated blood loss was minimal. Impression:               - No gross lesions in the entire esophagus.                           - Benign-appearing esophageal stenosis. Dilated to  17 mm Savary with mucosal wrent noted. Injected                            steroids.                           - A medium amount of food (residue) in the stomach.                           - No other gross lesions in the entire stomach.                           - No gross lesions in the duodenal bulb, in the                            first portion of the duodenum and in the second                            portion of the duodenum. Recommendation:           - The patient will be observed post-procedure,                            until all discharge criteria are met.                           - Discharge patient to home.                           - Patient has a contact number available for                            emergencies. The signs and symptoms of potential                            delayed complications were discussed with the                            patient. Return to normal activities tomorrow.                            Written discharge instructions were provided to the                            patient.                           - Resume previous diet.                           - Continue present medications.                           - Repeat upper endoscopy in 1 month for retreatment.                           -  Recommend SF-GES to evaluate for gastroparesis.                           - Restart Plavix on 10/24 to decrease risk of                            post-interventional bleeding.                           - Carafate twice daily as slurry.                            - Change PPI to Aciphex 20 mg BID.                           - Continue present medications.                           - The findings and recommendations were discussed                            with the patient.                           - The findings and recommendations were discussed                            with the patient's family. Corliss Parish, MD 06/16/2023 4:26:21 PM

## 2023-06-16 NOTE — Progress Notes (Unsigned)
Kenalog 200mg /59mL injected into esophageal areas per DO- given per Estella Husk RN.  Total of 4 mL injected

## 2023-06-16 NOTE — Progress Notes (Unsigned)
Progress Note Patient evaluated in post-procedure recovery area. O2 sats in the 89-92 range. During procedure, overt aspiration did not seem to occur even with the foodstuffs/liquids in the stomach as I had suctioned those up as quickly as possible. He has gotten up and moved. Then O2 Sat in the 89-90 range. Due to chance of aspiration pneumonitis, I will send in a Rx for Augmentin 875 mg BID. I'll have patient come in for CXR 2-view tomorrow to [facility] as that is where he works. We did discuss that we could send him to the ED today, but he is feeling improved at this time and would like to go home. He and wife know precautions to let us know overnight if he worsens. I'll have my team reach out to him tomorrow.  Corliss Parish, MD [facility] Office # [phone]

## 2023-06-16 NOTE — Progress Notes (Unsigned)
Vss nad mtrans to pacu

## 2023-06-17 ENCOUNTER — Telehealth: Payer: Self-pay

## 2023-06-17 DIAGNOSIS — K219 Gastro-esophageal reflux disease without esophagitis: Secondary | ICD-10-CM

## 2023-06-17 DIAGNOSIS — K3189 Other diseases of stomach and duodenum: Secondary | ICD-10-CM

## 2023-06-17 DIAGNOSIS — R198 Other specified symptoms and signs involving the digestive system and abdomen: Secondary | ICD-10-CM

## 2023-06-17 NOTE — Telephone Encounter (Signed)
-----   Message from [facility] sent at 06/16/2023  5:17 PM EDT ----- Regarding: Followup Hector Curry, Can you reach out to patient on Wednesday and check and see how he is doing post EGD we had some concern for possible aspiration? 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 Also, he needs a SF-GES to be ordered to rule out gastroparesis with his symptoms of reflux and findings on endoscopy. This can be scheduled as able. Please make sure my CXR 2-view order for [facility] is correct (I can see it). Thanks. GM

## 2023-06-17 NOTE — Telephone Encounter (Signed)
  Follow up Call-     06/16/2023    3:22 PM 05/19/2023    7:10 AM  Call back number  Post procedure Call Back phone  # [phone]  Permission to leave phone message Yes Yes     Left message

## 2023-06-17 NOTE — Telephone Encounter (Signed)
GES order entered and sent to the schedulers  Order for Xray is correct   Left message on machine to call back   Message sent to My Chart

## 2023-06-18 ENCOUNTER — Encounter: Payer: Self-pay | Admitting: Gastroenterology

## 2023-06-18 ENCOUNTER — Ambulatory Visit (HOSPITAL_COMMUNITY)
Admission: RE | Admit: 2023-06-18 | Discharge: 2023-06-18 | Disposition: A | Discharge status: Home / Self Care | Payer: Managed Care, Other (non HMO) | Source: Ambulatory Visit | Attending: Gastroenterology | Admitting: Gastroenterology

## 2023-06-18 DIAGNOSIS — J690 Pneumonitis due to inhalation of food and vomit: Secondary | ICD-10-CM | POA: Insufficient documentation

## 2023-06-19 ENCOUNTER — Other Ambulatory Visit: Payer: Self-pay

## 2023-06-19 DIAGNOSIS — J690 Pneumonitis due to inhalation of food and vomit: Secondary | ICD-10-CM

## 2023-06-22 ENCOUNTER — Other Ambulatory Visit (HOSPITAL_COMMUNITY): Payer: Self-pay

## 2023-06-23 NOTE — Telephone Encounter (Signed)
Thank you for update. GM 

## 2023-06-23 NOTE — Telephone Encounter (Signed)
The pt states he is doing much better and is able to breathe. He says he believes the antibiotics have begun to work. He will call back if he has any further concerns.

## 2023-06-29 ENCOUNTER — Other Ambulatory Visit (HOSPITAL_COMMUNITY)
Admission: RE | Admit: 2023-06-29 | Discharge: 2023-06-29 | Disposition: A | Discharge status: Home / Self Care | Payer: Managed Care, Other (non HMO) | Source: Ambulatory Visit | Attending: Internal Medicine | Admitting: Internal Medicine

## 2023-06-29 DIAGNOSIS — Z79899 Other long term (current) drug therapy: Secondary | ICD-10-CM | POA: Diagnosis present

## 2023-06-29 LAB — COMPREHENSIVE METABOLIC PANEL
ALT: 36 U/L (ref 0–44)
AST: 19 U/L (ref 15–41)
Albumin: 3.5 g/dL (ref 3.5–5.0)
Alkaline Phosphatase: 64 U/L (ref 38–126)
Anion gap: 7 (ref 5–15)
BUN: 7 mg/dL (ref 6–20)
CO2: 29 mmol/L (ref 22–32)
Calcium: 8.8 mg/dL — ABNORMAL LOW (ref 8.9–10.3)
Chloride: 100 mmol/L (ref 98–111)
Creatinine, Ser: 0.49 mg/dL — ABNORMAL LOW (ref 0.61–1.24)
GFR, Estimated: >60 mL/min (ref >60)
Glucose, Bld: 149 mg/dL — ABNORMAL HIGH (ref 70–99)
Potassium: 3.3 mmol/L — ABNORMAL LOW (ref 3.5–5.1)
Sodium: 136 mmol/L (ref 135–145)
Total Bilirubin: 0.7 mg/dL (ref <1.2)
Total Protein: 6.4 g/dL — ABNORMAL LOW (ref 6.5–8.1)

## 2023-06-29 LAB — LIPID PANEL
Cholesterol: 156 mg/dL (ref 0–200)
HDL: 37 mg/dL — ABNORMAL LOW (ref >40)
LDL Cholesterol: 102 mg/dL — ABNORMAL HIGH (ref 0–99)
Total CHOL/HDL Ratio: 4.2 {ratio}
Triglycerides: 84 mg/dL (ref <150)
VLDL: 17 mg/dL (ref 0–40)

## 2023-06-29 LAB — CBC WITH DIFFERENTIAL/PLATELET
Abs Immature Granulocytes: 0.06 10*3/uL (ref 0.00–0.07)
Basophils Absolute: 0 10*3/uL (ref 0.0–0.1)
Basophils Relative: 0 %
Eosinophils Absolute: 0 10*3/uL (ref 0.0–0.5)
Eosinophils Relative: 0 %
HCT: 44 % (ref 39.0–52.0)
Hemoglobin: 15.3 g/dL (ref 13.0–17.0)
Immature Granulocytes: 1 %
Lymphocytes Relative: 13 %
Lymphs Abs: 1.3 10*3/uL (ref 0.7–4.0)
MCH: 30.7 pg (ref 26.0–34.0)
MCHC: 34.8 g/dL (ref 30.0–36.0)
MCV: 88.2 fL (ref 80.0–100.0)
Monocytes Absolute: 0.5 10*3/uL (ref 0.1–1.0)
Monocytes Relative: 5 %
Neutro Abs: 7.9 10*3/uL — ABNORMAL HIGH (ref 1.7–7.7)
Neutrophils Relative %: 81 %
Platelets: 171 10*3/uL (ref 150–400)
RBC: 4.99 MIL/uL (ref 4.22–5.81)
RDW: 14.3 % (ref 11.5–15.5)
WBC: 9.9 10*3/uL (ref 4.0–10.5)
nRBC: 0 % (ref 0.0–0.2)

## 2023-06-29 LAB — HEMOGLOBIN A1C
Hgb A1c MFr Bld: 6.4 % — ABNORMAL HIGH (ref 4.8–5.6)
Mean Plasma Glucose: 136.98 mg/dL

## 2023-06-29 LAB — TSH: TSH: 0.214 u[IU]/mL — ABNORMAL LOW (ref 0.350–4.500)

## 2023-06-30 LAB — PSA, TOTAL AND FREE
PSA, Free Pct: 17.7 %
PSA, Free: 0.23 ng/mL
Prostate Specific Ag, Serum: 1.3 ng/mL (ref 0.0–4.0)

## 2023-07-03 ENCOUNTER — Encounter (HOSPITAL_COMMUNITY): Payer: Self-pay

## 2023-07-03 ENCOUNTER — Encounter (HOSPITAL_COMMUNITY)
Admission: RE | Admit: 2023-07-03 | Discharge: 2023-07-03 | Disposition: A | Discharge status: Home / Self Care | Payer: Managed Care, Other (non HMO) | Source: Ambulatory Visit | Attending: Gastroenterology | Admitting: Gastroenterology

## 2023-07-03 DIAGNOSIS — K219 Gastro-esophageal reflux disease without esophagitis: Secondary | ICD-10-CM | POA: Insufficient documentation

## 2023-07-03 DIAGNOSIS — K3189 Other diseases of stomach and duodenum: Secondary | ICD-10-CM | POA: Diagnosis present

## 2023-07-03 DIAGNOSIS — R198 Other specified symptoms and signs involving the digestive system and abdomen: Secondary | ICD-10-CM | POA: Diagnosis present

## 2023-07-03 MED ORDER — TECHNETIUM TC 99M SULFUR COLLOID
2.0000 | Freq: Once | INTRAVENOUS | Status: AC | PRN
  Administered 2023-07-03: 2.2 via ORAL

## 2023-07-06 ENCOUNTER — Other Ambulatory Visit: Payer: Self-pay

## 2023-07-06 ENCOUNTER — Other Ambulatory Visit (HOSPITAL_COMMUNITY): Payer: Self-pay

## 2023-07-06 DIAGNOSIS — R198 Other specified symptoms and signs involving the digestive system and abdomen: Secondary | ICD-10-CM

## 2023-07-06 MED ORDER — METOCLOPRAMIDE HCL 5 MG PO TABS
5.0000 mg | ORAL_TABLET | Freq: Every evening | ORAL | 1 refills | Status: DC
  Filled 2023-07-06: qty 60, 60d supply, fill #0

## 2023-07-08 ENCOUNTER — Encounter: Payer: Self-pay | Admitting: Gastroenterology

## 2023-07-10 ENCOUNTER — Other Ambulatory Visit (HOSPITAL_COMMUNITY): Payer: Self-pay

## 2023-07-14 ENCOUNTER — Other Ambulatory Visit (HOSPITAL_COMMUNITY): Payer: Self-pay

## 2023-07-21 ENCOUNTER — Ambulatory Visit: Payer: Managed Care, Other (non HMO) | Admitting: Gastroenterology

## 2023-07-21 ENCOUNTER — Other Ambulatory Visit (HOSPITAL_COMMUNITY): Payer: Self-pay

## 2023-07-21 ENCOUNTER — Telehealth: Payer: Self-pay

## 2023-07-21 ENCOUNTER — Encounter: Payer: Self-pay | Admitting: Gastroenterology

## 2023-07-21 VITALS — BP 130/85 | HR 75 | Temp 98.8°F | Resp 14 | Ht 74.0 in | Wt 198.0 lb

## 2023-07-21 DIAGNOSIS — K222 Esophageal obstruction: Secondary | ICD-10-CM

## 2023-07-21 DIAGNOSIS — K209 Esophagitis, unspecified without bleeding: Secondary | ICD-10-CM

## 2023-07-21 MED ORDER — SODIUM CHLORIDE 0.9 % IV SOLN: 500.0000 mL | Freq: Once | INTRAVENOUS | Status: DC

## 2023-07-21 MED ORDER — RABEPRAZOLE SODIUM 20 MG PO TBEC
20.0000 mg | DELAYED_RELEASE_TABLET | Freq: Two times a day (BID) | ORAL | 6 refills | Status: DC
  Filled 2023-07-21 – 2023-08-24 (×2): qty 60, 30d supply, fill #0
  Filled 2023-10-05: qty 180, 90d supply, fill #1
  Filled 2024-01-19: qty 180, 90d supply, fill #2

## 2023-07-21 NOTE — Op Note (Signed)
[facility] Patient Name: Hector Curry Procedure Date: 07/21/2023 1:30 PM MRN: [id number] Endoscopist: Corliss Parish , MD, [id number] Age: 62 Referring MD:  Date of Birth: 1961/07/27 Gender: Male Account #: [id number] Procedure:                Upper GI endoscopy Indications:              Dysphagia, Heartburn, Esophageal reflux,                            Gastroparesis Medicines:                Monitored Anesthesia Care Procedure:                Pre-Anesthesia Assessment:                           - Prior to the procedure, a History and Physical                            was performed, and patient medications and                            allergies were reviewed. The patient's tolerance of                            previous anesthesia was also reviewed. The risks                            and benefits of the procedure and the sedation                            options and risks were discussed with the patient.                            All questions were answered, and informed consent                            was obtained. Prior Anticoagulants: The patient                            last took Plavix (clopidogrel) 5 days prior to the                            procedure and has taken no anticoagulant or                            antiplatelet agents except for aspirin. ASA Grade                            Assessment: III - A patient with severe systemic                            disease. After reviewing the risks and benefits,  the patient was deemed in satisfactory condition to                            undergo the procedure.                           After obtaining informed consent, the endoscope was                            passed under direct vision. Throughout the                            procedure, the patient's blood pressure, pulse, and                            oxygen saturations were monitored continuously. The                             Olympus Scope [id number] was introduced through the                            mouth, and advanced to the second part of duodenum.                            The upper GI endoscopy was accomplished without                            difficulty. The patient tolerated the procedure. Scope In: Scope Out: Findings:                 No gross lesions were noted in the proximal                            esophagus and in the mid esophagus.                           LA Grade A (one or more mucosal breaks less than 5                            mm, not extending between tops of 2 mucosal folds)                            esophagitis with no bleeding was found in the mid                            esophagus.                           One benign-appearing, intrinsic mild                            (non-circumferential scarring) stenosis was found                            at the esophageal  anastomosis. This stenosis                            measured 1.6 cm (inner diameter) x 1 cm (in                            length). The stenosis was traversed. A guidewire                            was placed and the scope was withdrawn. Dilation                            was performed with a Savary dilator with mild                            resistance at 18 mm. The dilation site was examined                            following endoscope reinsertion and showed mild                            mucosal disruption, mild improvement in luminal                            narrowing and no perforation.                           A medium amount of food (residue) was found in the                            entire examined stomach.                           No gross lesions were noted in the entire examined                            stomach.                           The pylorus was normal.                           No gross lesions were noted in the duodenal bulb,                            in  the first portion of the duodenum and in the                            second portion of the duodenum. Complications:            No immediate complications. Estimated Blood Loss:     Estimated blood loss was minimal. Impression:               - No gross lesions in the proximal esophagus and in  the mid esophagus.                           - LA Grade A esophagitis with no bleeding found                            distally.                           - Benign-appearing esophageal stenosis at the                            anastomosis. Dilated to 18 mm savory with mucosal                            wrent noted.                           - A medium amount of food (residue) in the stomach.                           - No other gross lesions in the entire stomach.                           - Normal pylorus.                           - No gross lesions in the duodenal bulb, in the                            first portion of the duodenum and in the second                            portion of the duodenum. Recommendation:           - The patient will be observed post-procedure,                            until all discharge criteria are met.                           - Discharge patient to home.                           - Patient has a contact number available for                            emergencies. The signs and symptoms of potential                            delayed complications were discussed with the                            patient. Return to normal activities tomorrow.  Written discharge instructions were provided to the                            patient.                           - Dilation diet as per protocol.                           - Please use Cepacol or Halls Lozenges +/-                            Chloraseptic spray for next 72-96 hours to aid in                            sore thoat should you experience this.                            - Increase Aciphex to twice daily dosing to heal                            esophagitis. Consider the possible use of blood                            Voquenza in future.                           - May restart Plavix on 11/28 to decrease risk of                            post interventional bleeding. May continue aspirin.                           - Continue present medications.                           - Patient hesitant about metoclopramide use. Could                            consider initiation of treatment with Botox therapy                            to see if that is helpful for patient. Could also                            consider role of discussion for G-POEM.                           - The findings and recommendations were discussed                            with the patient.                           - The findings and recommendations  were discussed                            with the designated responsible adult. Corliss Parish, MD 07/21/2023 1:57:38 PM

## 2023-07-21 NOTE — Progress Notes (Signed)
Called to room to assist during endoscopic procedure.  Patient ID and intended procedure confirmed with present staff. Received instructions for my participation in the procedure from the performing physician.  

## 2023-07-21 NOTE — Telephone Encounter (Signed)
-----   Message from [facility] sent at 07/21/2023  3:45 PM EST ----- Regarding: Repeat EGD Hector Curry, The patient and wife and I discussed the need for repeat EGD with Botox.  Please schedule this for February in the hospital-based setting for underlying diagnosis of gastroparesis.  Also go ahead and set him up for a follow-up in clinic with me a month after the endoscopy so it is on the books. Thanks. GM

## 2023-07-21 NOTE — Patient Instructions (Addendum)
Thank you for letting us take care of your healthcare  needs today. Please see handouts given to you on Dilation Diet protocol and Esophagitis. Today we will increase your Aciphex to twice a day. You may restart your Plavix on 11/28. Please use Cepacol lozenges and or Chloraseptic spray for the next 3-4 days as needed. The office will call with appt for the Hospital EGD with Botox. You may use Miralax 1 capful daily with 8 ounces of water or Dulcolax 1 tab daily.    YOU HAD AN ENDOSCOPIC PROCEDURE TODAY AT THE [facility]:   Refer to the procedure report that was given to you for any specific questions about what was found during the examination.  If the procedure report does not answer your questions, please call your gastroenterologist to clarify.  If you requested that your care partner not be given the details of your procedure findings, then the procedure report has been included in a sealed envelope for you to review at your convenience later.  YOU SHOULD EXPECT: Some feelings of bloating in the abdomen. Passage of more gas than usual.  Walking can help get rid of the air that was put into your GI tract during the procedure and reduce the bloating. If you had a lower endoscopy (such as a colonoscopy or flexible sigmoidoscopy) you may notice spotting of blood in your stool or on the toilet paper. If you underwent a bowel prep for your procedure, you may not have a normal bowel movement for a few days.  Please Note:  You might notice some irritation and congestion in your nose or some drainage.  This is from the oxygen used during your procedure.  There is no need for concern and it should clear up in a day or so.  SYMPTOMS TO REPORT IMMEDIATELY:  Following upper endoscopy (EGD)  Vomiting of blood or coffee ground material  New chest pain or pain under the shoulder blades  Painful or persistently difficult swallowing  New shortness of breath  Fever of 100F or  higher  Black, tarry-looking stools  For urgent or emergent issues, a gastroenterologist can be reached at any hour by calling [phone]. Do not use MyChart messaging for urgent concerns.    DIET:  We do recommend a small meal at first, but then you may proceed to your regular diet.  Drink plenty of fluids but you should avoid alcoholic beverages for 24 hours.  ACTIVITY:  You should plan to take it easy for the rest of today and you should NOT DRIVE or use heavy machinery until tomorrow (because of the sedation medicines used during the test).    FOLLOW UP: Our staff will call the number listed on your records the next business day following your procedure.  We will call around 7:15- 8:00 am to check on you and address any questions or concerns that you may have regarding the information given to you following your procedure. If we do not reach you, we will leave a message.     If any biopsies were taken you will be contacted by phone or by letter within the next 1-3 weeks.  Please call us at [phone] if you have not heard about the biopsies in 3 weeks.    SIGNATURES/CONFIDENTIALITY: You and/or your care partner have signed paperwork which will be entered into your electronic medical record.  These signatures attest to the fact that that the information above on your After Visit Summary has been reviewed  and is understood.  Full responsibility of the confidentiality of this discharge information lies with you and/or your care-partner.

## 2023-07-21 NOTE — Progress Notes (Signed)
GASTROENTEROLOGY PROCEDURE H&P NOTE   Primary Care Physician: Marguarite Arbour, MD  HPI: Hector Curry is a 62 y.o. male who presents for EGD for evaluation of esophageal stricture s/p dilations and recently diagnosed Gastroparesis.  Past Medical History:  Diagnosis Date   Allergic rhinitis    Atrial tachycardia (HCC)    CAD (coronary artery disease)    a. 05/08/2015 Cath: no significant CAD, LVEF nl-->Med; b. 07/2017 MV: attenuation artifact, no ischemia, EF 65%-->Low risk; c. 03/2021 NSTEMI/PCI: LM nl, LAD 80p (3.0x18 Onyx Frontier DES), D1 90 (too small for PCI), LCX nl, RCA nl.   Diabetes mellitus without complication (HCC)    Diastolic dysfunction    a. 04/2015 Echo: EF 60-65%; b. 05/2016 Echo: EF 60-65%, GrI DD; c. 07/2017 Echo: EF 55-60%, Ao root 42mm; d. 03/2019 Echo: EF 55-60%, Ao root/Asc Ao 42mm; e. 03/2021 Echo: EF 50-55%, apical HK, mod asymm basal-septal hypertrophy. Nl RV fxn. Asc Ao 44mm.   Dilated aortic root (HCC)    a. 07/2017 Echo: 42mm Ao root - mildly dil; b. 03/2019 Echo: Ao root 42mm; c. 03/2021 Echo: Asc Ao 44mm.   Diverticulosis 10/03/2014   Dysrhythmia    Esophageal stricture    a.  In setting of lap band June 2022-stricture at the GJ anastomosis requiring gastrostomy tube; b. 05/2021 s/p esoph stenting (15mm); c. 07/2021 s/p esoph stenting (20mm).   Family history of premature CAD    a. father passed from MI at 79   GERD (gastroesophageal reflux disease)    GIB (gastrointestinal bleeding)    a. 07/2021 following esoph stenting.   Hemorrhoids    a. internal hemorrhoids s/p surgery 1999   History of kidney stones    History of tobacco abuse    Hyperlipidemia    Hyperplastic colon polyp 10/03/2014   a. x 2    Hypertension    Inflammatory arthritis    a. CCP antibodies & x-rays negative. Rheumatoid factor 14, felt to be crystaline over RA or psoriatic   Iron deficiency anemia 02/05/2022   Morbid obesity (HCC)    a. s/p LAP-BAND - complicated by esoph  stricture.   Myocardial infarction [facility])    NSTEMI   Nausea and vomiting 04/11/2023   OSA (obstructive sleep apnea)    a. on CPAP   Osteoarthritis    PSVT (paroxysmal supraventricular tachycardia) (HCC)    a. 48 hr Holter 04/2015: NSR w/ rare PVC, short runs of narrow complex tachycardiac, possible atrial tach, longest run 7 beats, PACs noted (2% of all beats 3600 total) they did not seem to correlate w/ significant arrythmia; b. 08/2017 Event monitor: no significant arrhythmias.   Past Surgical History:  Procedure Laterality Date   BALLOON DILATION N/A 08/14/2021   Procedure: BALLOON DILATION;  Surgeon: Mansouraty, Netty Starring., MD;  Location: [facility]COPY;  Service: Gastroenterology;  Laterality: N/A;   BALLOON DILATION N/A 02/20/2022   Procedure: BALLOON DILATION;  Surgeon: Rachael Fee, MD;  Location: [facility];  Service: Gastroenterology;  Laterality: N/A;   BALLOON DILATION N/A 03/27/2022   Procedure: BALLOON DILATION;  Surgeon: Meridee Score Netty Starring., MD;  Location: [facility] ENDOSCOPY;  Service: Gastroenterology;  Laterality: N/A;   BARIATRIC SURGERY  01/2021   lap band    BILIARY STENT PLACEMENT N/A 08/14/2021   Procedure: AXIOS STENT PLACEMENT;  Surgeon: Lemar Lofty., MD;  Location: [facility]COPY;  Service: Gastroenterology;  Laterality: N/A;   BILIARY STENT PLACEMENT N/A 10/16/2022   Procedure: STENT PLACEMENT;  Surgeon:  Mansouraty, Netty Starring., MD;  Location: [facility];  Service: Gastroenterology;  Laterality: N/A;   BILIARY STENT PLACEMENT N/A 04/09/2023   Procedure: AXIOS STENT PLACEMENT;  Surgeon: Lemar Lofty., MD;  Location: [facility];  Service: Gastroenterology;  Laterality: N/A;   BIOPSY  07/08/2021   Procedure: BIOPSY;  Surgeon: Meridee Score Netty Starring., MD;  Location: [facility];  Service: Gastroenterology;;   BIOPSY  08/14/2021   Procedure: BIOPSY;  Surgeon: Lemar Lofty., MD;  Location: [facility];  Service: Gastroenterology;;    BIOPSY  10/16/2022   Procedure: BIOPSY;  Surgeon: Lemar Lofty., MD;  Location: [facility];  Service: Gastroenterology;;   CARDIAC CATHETERIZATION N/A 05/08/2015   Procedure: Left Heart Cath and Coronary Angiography;  Surgeon: Corky Crafts, MD;  Location: [facility] INVASIVE CV LAB;  Service: Cardiovascular;  Laterality: N/A;   CARPAL TUNNEL RELEASE Bilateral    CHOLECYSTECTOMY     COLONOSCOPY  06/27/2005   COLONOSCOPY  10/03/2014   CORONARY STENT INTERVENTION N/A 04/15/2021   Procedure: CORONARY STENT INTERVENTION;  Surgeon: Runell Gess, MD;  Location: [facility] INVASIVE CV LAB;  Service: Cardiovascular;  Laterality: N/A;   DUODENAL STENT PLACEMENT N/A 11/18/2021   Procedure: DUODENAL STENT PLACEMENT;  Surgeon: Meridee Score Netty Starring., MD;  Location: [facility];  Service: Gastroenterology;  Laterality: N/A;  axios placed at GJ anastamosis   DUODENAL STENT PLACEMENT  03/27/2022   Procedure: GASTRIC STENT PLACEMENT;  Surgeon: Meridee Score Netty Starring., MD;  Location: [facility] ENDOSCOPY;  Service: Gastroenterology;;   ESOPHAGEAL DILATION  05/27/2021   Procedure: ESOPHAGEAL DILATION;  Surgeon: Lemar Lofty., MD;  Location: [facility];  Service: Gastroenterology;;   ESOPHAGEAL DILATION  01/23/2022   Procedure: ESOPHAGEAL DILATION;  Surgeon: Lemar Lofty., MD;  Location: [facility] ENDOSCOPY;  Service: Gastroenterology;;   ESOPHAGEAL DILATION  04/09/2023   Procedure: ESOPHAGEAL DILATION;  Surgeon: Lemar Lofty., MD;  Location: [facility]OSCOPY;  Service: Gastroenterology;;   ESOPHAGEAL STENT PLACEMENT N/A 05/27/2021   Procedure: ESOPHAGEAL STENT PLACEMENT;  Surgeon: Lemar Lofty., MD;  Location: [facility];  Service: Gastroenterology;  Laterality: N/A;   ESOPHAGOGASTRODUODENOSCOPY  06/27/2005   ESOPHAGOGASTRODUODENOSCOPY N/A 03/19/2021   Procedure: ESOPHAGOGASTRODUODENOSCOPY (EGD);  Surgeon: Sheliah Hatch, De Blanch, MD;  Location: [facility]COPY;  Service: General;   Laterality: N/A;   ESOPHAGOGASTRODUODENOSCOPY N/A 08/16/2021   Procedure: ESOPHAGOGASTRODUODENOSCOPY (EGD);  Surgeon: Lemar Lofty., MD;  Location: [facility] ENDOSCOPY;  Service: Gastroenterology;  Laterality: N/A;   ESOPHAGOGASTRODUODENOSCOPY (EGD) WITH PROPOFOL N/A 05/27/2021   Procedure: ESOPHAGOGASTRODUODENOSCOPY (EGD) WITH PROPOFOL;  Surgeon: Meridee Score Netty Starring., MD;  Location: [facility];  Service: Gastroenterology;  Laterality: N/A;   ESOPHAGOGASTRODUODENOSCOPY (EGD) WITH PROPOFOL N/A 07/08/2021   Procedure: ESOPHAGOGASTRODUODENOSCOPY (EGD) WITH PROPOFOL;  Surgeon: Meridee Score Netty Starring., MD;  Location: [facility]OS[facility];  Service: Gastroenterology;  Laterality: N/A;  fluoro   ESOPHAGOGASTRODUODENOSCOPY (EGD) WITH PROPOFOL N/A 08/14/2021   Procedure: ESOPHAGOGASTRODUODENOSCOPY (EGD) WITH PROPOFOL;  Surgeon: Meridee Score Netty Starring., MD;  Location: [facility] ENDOSCOPY;  Service: Gastroenterology;  Laterality: N/A;  fluoro Axios stent (20 mm)   ESOPHAGOGASTRODUODENOSCOPY (EGD) WITH PROPOFOL N/A 10/16/2021   Procedure: ESOPHAGOGASTRODUODENOSCOPY (EGD) WITH PROPOFOL;  Surgeon: Meridee Score Netty Starring., MD;  Location: [facility];  Service: Gastroenterology;  Laterality: N/A;  axios stent pull fluoro   ESOPHAGOGASTRODUODENOSCOPY (EGD) WITH PROPOFOL N/A 11/18/2021   Procedure: ESOPHAGOGASTRODUODENOSCOPY (EGD) WITH PROPOFOL;  Surgeon: Meridee Score Netty Starring., MD;  Location: [facility];  Service: Gastroenterology;  Laterality: N/A;   ESOPHAGOGASTRODUODENOSCOPY (EGD) WITH PROPOFOL N/A 01/23/2022   Procedure: ESOPHAGOGASTRODUODENOSCOPY (EGD) WITH PROPOFOL;  Surgeon: Lemar Lofty., MD;  Location: [facility] ENDOSCOPY;  Service: Gastroenterology;  Laterality: N/A;   ESOPHAGOGASTRODUODENOSCOPY (EGD) WITH PROPOFOL N/A 02/20/2022   Procedure: ESOPHAGOGASTRODUODENOSCOPY (EGD) WITH PROPOFOL;  Surgeon: Rachael Fee, MD;  Location: [facility];  Service: Gastroenterology;  Laterality: N/A;    ESOPHAGOGASTRODUODENOSCOPY (EGD) WITH PROPOFOL N/A 03/27/2022   Procedure: ESOPHAGOGASTRODUODENOSCOPY (EGD) WITH PROPOFOL;  Surgeon: Meridee Score Netty Starring., MD;  Location: [facility] ENDOSCOPY;  Service: Gastroenterology;  Laterality: N/A;   ESOPHAGOGASTRODUODENOSCOPY (EGD) WITH PROPOFOL N/A 08/14/2022   Procedure: ESOPHAGOGASTRODUODENOSCOPY (EGD) WITH PROPOFOL;  Surgeon: Meridee Score Netty Starring., MD;  Location: [facility];  Service: Gastroenterology;  Laterality: N/A;   ESOPHAGOGASTRODUODENOSCOPY (EGD) WITH PROPOFOL N/A 10/16/2022   Procedure: ESOPHAGOGASTRODUODENOSCOPY (EGD) WITH PROPOFOL;  Surgeon: Meridee Score Netty Starring., MD;  Location: [facility];  Service: Gastroenterology;  Laterality: N/A;   ESOPHAGOGASTRODUODENOSCOPY (EGD) WITH PROPOFOL N/A 11/28/2022   Procedure: ESOPHAGOGASTRODUODENOSCOPY (EGD) WITH PROPOFOL WITH AXIOS STENT REMOVAL;  Surgeon: Meridee Score Netty Starring., MD;  Location: [facility] ENDOSCOPY;  Service: Gastroenterology;  Laterality: N/A;   ESOPHAGOGASTRODUODENOSCOPY (EGD) WITH PROPOFOL N/A 04/09/2023   Procedure: ESOPHAGOGASTRODUODENOSCOPY (EGD) WITH PROPOFOL;  Surgeon: Meridee Score Netty Starring., MD;  Location: [facility];  Service: Gastroenterology;  Laterality: N/A;  dilation - axios stent   ESOPHAGOGASTRODUODENOSCOPY (EGD) WITH PROPOFOL N/A 04/11/2023   Procedure: ESOPHAGOGASTRODUODENOSCOPY (EGD) WITH PROPOFOL;  Surgeon: Beverley Fiedler, MD;  Location: [facility];  Service: Gastroenterology;  Laterality: N/A;   ESOPHAGOGASTRODUODENOSCOPY (EGD) WITH PROPOFOL N/A 04/30/2023   Procedure: ESOPHAGOGASTRODUODENOSCOPY (EGD) WITH PROPOFOL;  Surgeon: Meridee Score Netty Starring., MD;  Location: [facility] END[facility];  Service: Gastroenterology;  Laterality: N/A;  axios stent pull   FLEXIBLE SIGMOIDOSCOPY N/A 08/16/2021   Procedure: FLEXIBLE SIGMOIDOSCOPY;  Surgeon: Meridee Score Netty Starring., MD;  Location: [facility] ENDOSCOPY;  Service: Gastroenterology;  Laterality: N/A;   FOREIGN BODY REMOVAL  02/20/2022   Procedure:  FOREIGN BODY REMOVAL;  Surgeon: Rachael Fee, MD;  Location: [facility] ENDOS[facility];  Service: Gastroenterology;;   FOREIGN BODY REMOVAL ESOPHAGEAL  03/27/2022   Procedure: REMOVAL FOREIGN BODY;  Surgeon: Meridee Score Netty Starring., MD;  Location: [facility] ENDOSCOPY;  Service: Gastroenterology;;   GASTRIC ROUX-EN-Y N/A 02/18/2021   Procedure: LAPAROSCOPIC ROUX-EN-Y GASTRIC BYPASS WITH UPPER ENDOSCOPY,;  Surgeon: Sheliah Hatch De Blanch, MD;  Location: [facility] ORS;  Service: General;  Laterality: N/A;   GASTRIC ROUX-EN-Y N/A 12/02/2022   Procedure: LAPAROSCOPIC converted to OPEN GASTRIC BYPASS REVERSAL, TAKEDOWN OF GASTROJEJUNAL ANASTOMOSIS, GASTRO GASTRIC ANASTOMOSIS WITH UPPER ENDOSCOPY 1. Laparoscopic lysis of adhesions 2. Partial gastrectomy 3. Small bowel resection 4. Creation of esophagus to gastric anastomosis 5. Creation of gastrostomy;  Surgeon: Kinsinger, De Blanch, MD;  Location: [facility] ORS;  Service: General;  Laterality:    GASTROINTESTINAL STENT REMOVAL  01/23/2022   Procedure: Christy Sartorius REMOVAL;  Surgeon: Lemar Lofty., MD;  Location: [facility] ENDOSCOPY;  Service: Gastroenterology;;   GASTROINTESTINAL STENT REMOVAL N/A 04/30/2023   Procedure: GASTROINTESTINAL STENT REMOVAL;  Surgeon: Lemar Lofty., MD;  Location: [facility];  Service: Gastroenterology;  Laterality: N/A;   GASTROSTOMY N/A 12/02/2022   Procedure: INSERTION OF GASTROSTOMY TUBE;  Surgeon: Sheliah Hatch De Blanch, MD;  Location: [facility] ORS;  Service: General;  Laterality: N/A;   HEMORRHOID SURGERY  08/25/1997   IR GJ TUBE CHANGE  03/27/2021   LAPAROSCOPIC INSERTION GASTROSTOMY TUBE N/A 03/20/2021   Procedure: LAPAROSCOPIC INSERTION GASTROSTOMY TUBE;  Surgeon: Rodman Pickle, MD;  Location: [facility] ORS;  Service: General;  Laterality: N/A;   LEFT HEART CATH AND CORONARY ANGIOGRAPHY N/A 04/15/2021   Procedure: LEFT HEART CATH AND  CORONARY ANGIOGRAPHY;  Surgeon: Runell Gess, MD;  Location: [facility] INVASIVE CV LAB;  Service: Cardiovascular;   Laterality: N/A;   MOUTH SURGERY     removed area which was benign   SCLEROTHERAPY  04/30/2023   Procedure: SCLEROTHERAPY;  Surgeon: Mansouraty, Netty Starring., MD;  Location: [facility] ENDOSCOPY;  Service: Gastroenterology;;   Francine Graven REMOVAL  07/08/2021   Procedure: STENT REMOVAL;  Surgeon: Lemar Lofty., MD;  Location: [facility] ENDOSCOPY;  Service: Gastroenterology;;   Francine Graven REMOVAL  10/16/2021   Procedure: STENT REMOVAL;  Surgeon: Lemar Lofty., MD;  Location: [facility] ENDOSCOPY;  Service: Gastroenterology;;   Francine Graven REMOVAL  08/14/2022   Procedure: STENT REMOVAL;  Surgeon: Lemar Lofty., MD;  Location: [facility] ENDOSCOPY;  Service: Gastroenterology;;   Francine Graven REMOVAL  11/28/2022   Procedure: STENT REMOVAL;  Surgeon: Lemar Lofty., MD;  Location: [facility] ENDOSCOPY;  Service: Gastroenterology;;   Francine Graven REMOVAL  04/11/2023   Procedure: STENT REMOVAL;  Surgeon: Beverley Fiedler, MD;  Location: [facility] ENDOSCOPY;  Service: Gastroenterology;;   STERIOD INJECTION  01/23/2022   Procedure: STEROID INJECTION;  Surgeon: Lemar Lofty., MD;  Location: [facility] ENDOSCOPY;  Service: Gastroenterology;;   SUBMUCOSAL INJECTION  11/18/2021   Procedure: SUBMUCOSAL INJECTION;  Surgeon: Lemar Lofty., MD;  Location: [facility] ENDOSCOPY;  Service: Gastroenterology;;   SUBMUCOSAL INJECTION  08/14/2022   Procedure: SUBMUCOSAL INJECTION;  Surgeon: Lemar Lofty., MD;  Location: [facility] ENDOSCOPY;  Service: Gastroenterology;;   TONSILLECTOMY     UPPER GI ENDOSCOPY N/A 03/20/2021   Procedure: UPPER GI ENDOSCOPY;  Surgeon: Rodman Pickle, MD;  Location: [facility];  Service: General;  Laterality: N/A;   Current Outpatient Medications  Medication Sig Dispense Refill   amoxicillin-clavulanate (AUGMENTIN) 875-125 MG tablet Take 1 tablet by mouth 2 (two) times daily. 20 tablet 0   atorvastatin (LIPITOR) 10 MG tablet Take 1 tablet (10 mg total) by mouth daily. (Patient not taking: Reported on 06/11/2023) 90  tablet 0   cetirizine (ZYRTEC) 10 MG tablet Take by mouth.     clopidogrel (PLAVIX) 75 MG tablet Take 1 tablet (75 mg total) by mouth daily. 90 tablet 1   Continuous Glucose Sensor (FREESTYLE LIBRE 14 DAY SENSOR) MISC Insert 1 sensor to the back of the arm as directed every 14 days for continuous glucose monitoring 6 each 1   doxycycline (VIBRAMYCIN) 100 MG capsule Take 1 capsule (100 mg total) by mouth 2 (two) times daily. 20 capsule 0   fluticasone (FLONASE) 50 MCG/ACT nasal spray Place 2-3 sprays into both nostrils daily as needed for allergies or rhinitis.     insulin lispro (HUMALOG) 100 UNIT/ML KwikPen Inject subcutaneously 3 (three) times daily with meals Per sliding scale (Patient taking differently: Inject 3-7 Units into the skin daily as needed (For high blood sugar).) 15 mL 1   Insulin Pen Needle (UNIFINE PENTIPS) 32G X 4 MM MISC Use as needed as directed 100 each 3   metoCLOPramide (REGLAN) 5 MG tablet Take 1 tablet (5 mg total) by mouth at bedtime. 60 tablet 1   mometasone (ELOCON) 0.1 % lotion Apply to affected areas once daily (Patient taking differently: Apply 1 Application topically as needed (psoriasis).) 60 mL 5   Multiple Vitamin (MULTI-VITAMIN DAILY PO) Take 1 patch by mouth daily.     nitroGLYCERIN (NITROSTAT) 0.4 MG SL tablet Place 1 tablet (0.4 mg total) under the tongue every 5 (five) minutes as needed for chest pain. 25 tablet 3   ondansetron (ZOFRAN-ODT) 4 MG disintegrating  tablet Take 1 tablet (4 mg total) by mouth every 6 (six) hours as needed for nausea or vomiting. 20 tablet 0   OVER THE COUNTER MEDICATION Apply 1 Application topically daily at 6 (six) AM. Medication: Bariatric patch     prochlorperazine (COMPAZINE) 25 MG suppository Place 1 suppository (25 mg total) rectally every 12 (twelve) hours as needed for nausea or vomiting. 6 suppository 0   prochlorperazine (COMPAZINE) 5 MG tablet Take 1 tablet (5 mg total) by mouth every 6 (six) hours as needed for nausea or  vomiting. 30 tablet 0   propranolol (INDERAL) 20 MG tablet Take 1 tablet (20 mg total) by mouth 3 (three) times daily. (Patient taking differently: Take 20 mg by mouth at bedtime.) 90 tablet 11   propranolol 40 MG/5ML solution Take 5 mg by mouth every evening. As needed (Patient not taking: Reported on 06/16/2023)     Propylene Glycol (SYSTANE BALANCE OP) Place 2-3 drops into both eyes daily as needed (dry eyes).     Pseudoeph-Doxylamine-DM-APAP (NYQUIL PO) Take 2 tablets by mouth at bedtime as needed (pain).     RABEprazole (ACIPHEX) 20 MG tablet Take 1 tablet (20 mg total) by mouth daily. 90 tablet 3   VITAMIN E PO Take 1 tablet by mouth daily. (Patient not taking: Reported on 06/11/2023)     No current facility-administered medications for this visit.    Current Outpatient Medications:    amoxicillin-clavulanate (AUGMENTIN) 875-125 MG tablet, Take 1 tablet by mouth 2 (two) times daily., Disp: 20 tablet, Rfl: 0   atorvastatin (LIPITOR) 10 MG tablet, Take 1 tablet (10 mg total) by mouth daily. (Patient not taking: Reported on 06/11/2023), Disp: 90 tablet, Rfl: 0   cetirizine (ZYRTEC) 10 MG tablet, Take by mouth., Disp: , Rfl:    clopidogrel (PLAVIX) 75 MG tablet, Take 1 tablet (75 mg total) by mouth daily., Disp: 90 tablet, Rfl: 1   Continuous Glucose Sensor (FREESTYLE LIBRE 14 DAY SENSOR) MISC, Insert 1 sensor to the back of the arm as directed every 14 days for continuous glucose monitoring, Disp: 6 each, Rfl: 1   doxycycline (VIBRAMYCIN) 100 MG capsule, Take 1 capsule (100 mg total) by mouth 2 (two) times daily., Disp: 20 capsule, Rfl: 0   fluticasone (FLONASE) 50 MCG/ACT nasal spray, Place 2-3 sprays into both nostrils daily as needed for allergies or rhinitis., Disp: , Rfl:    insulin lispro (HUMALOG) 100 UNIT/ML KwikPen, Inject subcutaneously 3 (three) times daily with meals Per sliding scale (Patient taking differently: Inject 3-7 Units into the skin daily as needed (For high blood  sugar).), Disp: 15 mL, Rfl: 1   Insulin Pen Needle (UNIFINE PENTIPS) 32G X 4 MM MISC, Use as needed as directed, Disp: 100 each, Rfl: 3   metoCLOPramide (REGLAN) 5 MG tablet, Take 1 tablet (5 mg total) by mouth at bedtime., Disp: 60 tablet, Rfl: 1   mometasone (ELOCON) 0.1 % lotion, Apply to affected areas once daily (Patient taking differently: Apply 1 Application topically as needed (psoriasis).), Disp: 60 mL, Rfl: 5   Multiple Vitamin (MULTI-VITAMIN DAILY PO), Take 1 patch by mouth daily., Disp: , Rfl:    nitroGLYCERIN (NITROSTAT) 0.4 MG SL tablet, Place 1 tablet (0.4 mg total) under the tongue every 5 (five) minutes as needed for chest pain., Disp: 25 tablet, Rfl: 3   ondansetron (ZOFRAN-ODT) 4 MG disintegrating tablet, Take 1 tablet (4 mg total) by mouth every 6 (six) hours as needed for nausea or vomiting., Disp: 20 tablet,  Rfl: 0   OVER THE COUNTER MEDICATION, Apply 1 Application topically daily at 6 (six) AM. Medication: Bariatric patch, Disp: , Rfl:    prochlorperazine (COMPAZINE) 25 MG suppository, Place 1 suppository (25 mg total) rectally every 12 (twelve) hours as needed for nausea or vomiting., Disp: 6 suppository, Rfl: 0   prochlorperazine (COMPAZINE) 5 MG tablet, Take 1 tablet (5 mg total) by mouth every 6 (six) hours as needed for nausea or vomiting., Disp: 30 tablet, Rfl: 0   propranolol (INDERAL) 20 MG tablet, Take 1 tablet (20 mg total) by mouth 3 (three) times daily. (Patient taking differently: Take 20 mg by mouth at bedtime.), Disp: 90 tablet, Rfl: 11   propranolol 40 MG/5ML solution, Take 5 mg by mouth every evening. As needed (Patient not taking: Reported on 06/16/2023), Disp: , Rfl:    Propylene Glycol (SYSTANE BALANCE OP), Place 2-3 drops into both eyes daily as needed (dry eyes)., Disp: , Rfl:    Pseudoeph-Doxylamine-DM-APAP (NYQUIL PO), Take 2 tablets by mouth at bedtime as needed (pain)., Disp: , Rfl:    RABEprazole (ACIPHEX) 20 MG tablet, Take 1 tablet (20 mg total) by  mouth daily., Disp: 90 tablet, Rfl: 3   VITAMIN E PO, Take 1 tablet by mouth daily. (Patient not taking: Reported on 06/11/2023), Disp: , Rfl:  Allergies  Allergen Reactions   Other Itching and Other (See Comments)    Cats Wheezing, runny nose/eyes, congestion    Family History  Problem Relation Age of Onset   Hypertension Mother    Diabetes Mother    Heart attack Father 44   CAD Father    Hypertension Sister    Diabetes Other    Hypertension Other    Heart disease Other    Colon cancer Neg Hx    Esophageal cancer Neg Hx    Pancreatic cancer Neg Hx    Stomach cancer Neg Hx    Liver disease Neg Hx    Inflammatory bowel disease Neg Hx    Rectal cancer Neg Hx    Social History   Socioeconomic History   Marital status: Married    Spouse name: Not on file   Number of children: 3   Years of education: Not on file   Highest education level: Not on file  Occupational History   Occupation: IT IT trainer  Tobacco Use   Smoking status: Former    Current packs/day: 0.00    Average packs/day: 1 pack/day for 15.0 years (15.0 ttl pk-yrs)    Types: Cigarettes    Start date: 08/05/1974    Quit date: 08/05/1989    Years since quitting: 33.9   Smokeless tobacco: Never  Vaping Use   Vaping status: Never Used  Substance and Sexual Activity   Alcohol use: Not Currently    Comment: occ   Drug use: Never   Sexual activity: Not Currently  Other Topics Concern   Not on file  Social History Narrative   Not on file   Social Determinants of Health   Financial Resource Strain: Low Risk  (06/12/2023)   Received from [facility]   Overall Financial Resource Strain (CARDIA)    Difficulty of Paying Living Expenses: Not hard at all  Food Insecurity: No Food Insecurity (06/12/2023)   Received from [facility]   Hunger Vital Sign    Worried About Running Out of Food in the Last Year: Never true    Ran Out of Food in the Last Year: Never  true   Transportation Needs: No Transportation Needs (06/12/2023)   Received from [facility] - Transportation    In the past 12 months, has lack of transportation kept you from medical appointments or from getting medications?: No    Lack of Transportation (Non-Medical): No  Physical Activity: Not on file  Stress: Not on file  Social Connections: Unknown (04/14/2023)   Received from [facility]   Social Network    Social Network: Not on file  Intimate Partner Violence: Unknown (04/14/2023)   Received from [facility]   HITS    Physically Hurt: Not on file    Insult or Talk Down To: Not on file    Threaten Physical Harm: Not on file    Scream or Curse: Not on file    Physical Exam: There were no vitals filed for this visit. There is no height or weight on file to calculate BMI. GEN: NAD EYE: Sclerae anicteric ENT: MMM CV: Non-tachycardic GI: Soft, NT/ND NEURO:  Alert & Oriented x 3  Lab Results: No results for input(s): "WBC", "HGB", "HCT", "PLT" in the last 72 hours. BMET No results for input(s): "NA", "K", "CL", "CO2", "GLUCOSE", "BUN", "CREATININE", "CALCIUM" in the last 72 hours. LFT No results for input(s): "PROT", "ALBUMIN", "AST", "ALT", "ALKPHOS", "BILITOT", "BILIDIR", "IBILI" in the last 72 hours. PT/INR No results for input(s): "LABPROT", "INR" in the last 72 hours.   Impression / Plan: This is a 62 y.o.male who presents for EGD for evaluation of esophageal stricture s/p dilations and recently diagnosed Gastroparesis.  The risks and benefits of endoscopic evaluation/treatment were discussed with the patient and/or family; these include but are not limited to the risk of perforation, infection, bleeding, missed lesions, lack of diagnosis, severe illness requiring hospitalization, as well as anesthesia and sedation related illnesses.  The patient's history has been reviewed, patient examined, no change in status, and deemed stable for  procedure.  The patient and/or family is agreeable to proceed.    Corliss Parish, MD [facility] Advanced [facility]oscopy Office # [phone]

## 2023-07-21 NOTE — Progress Notes (Signed)
Vss nad trans to pacu 

## 2023-07-22 ENCOUNTER — Telehealth: Payer: Self-pay

## 2023-07-22 NOTE — Telephone Encounter (Signed)
Samples of Linzess 145 mcg placed at front desk for patient. Called and left message for patient.

## 2023-07-22 NOTE — Telephone Encounter (Signed)
-----   Message from [facility] sent at 07/21/2023  3:46 PM EST ----- Regarding: Constipation [name], Can you let [name] know next week about Linzess or Trulance samples that we may have so that he can pick those up at some point for his constipation issues. Thanks. GM

## 2023-07-22 NOTE — Telephone Encounter (Signed)
Left message on follow up call. 

## 2023-07-31 ENCOUNTER — Other Ambulatory Visit (HOSPITAL_COMMUNITY): Payer: Self-pay

## 2023-07-31 ENCOUNTER — Other Ambulatory Visit: Payer: Self-pay

## 2023-07-31 MED ORDER — FREESTYLE LIBRE 14 DAY SENSOR MISC
1 refills | Status: DC
  Filled 2023-07-31: qty 6, 84d supply, fill #0
  Filled 2023-08-06 (×2): qty 2, 28d supply, fill #0
  Filled 2023-09-07: qty 2, 28d supply, fill #1
  Filled 2023-11-09: qty 2, 28d supply, fill #2
  Filled 2023-12-18 (×2): qty 2, 28d supply, fill #3

## 2023-08-03 ENCOUNTER — Other Ambulatory Visit: Payer: Self-pay

## 2023-08-03 ENCOUNTER — Encounter: Payer: Self-pay | Admitting: Pharmacist

## 2023-08-06 ENCOUNTER — Other Ambulatory Visit: Payer: Self-pay

## 2023-08-06 ENCOUNTER — Other Ambulatory Visit (HOSPITAL_COMMUNITY): Payer: Self-pay

## 2023-08-14 ENCOUNTER — Inpatient Hospital Stay: Payer: Managed Care, Other (non HMO)

## 2023-08-14 ENCOUNTER — Inpatient Hospital Stay: Payer: Managed Care, Other (non HMO) | Attending: Hematology

## 2023-08-14 ENCOUNTER — Encounter: Payer: Self-pay | Admitting: Physician Assistant

## 2023-08-14 DIAGNOSIS — D509 Iron deficiency anemia, unspecified: Secondary | ICD-10-CM | POA: Insufficient documentation

## 2023-08-14 DIAGNOSIS — D508 Other iron deficiency anemias: Secondary | ICD-10-CM

## 2023-08-14 LAB — CBC WITH DIFFERENTIAL/PLATELET
Abs Immature Granulocytes: 0.07 10*3/uL (ref 0.00–0.07)
Basophils Absolute: 0 10*3/uL (ref 0.0–0.1)
Basophils Relative: 0 %
Eosinophils Absolute: 0.1 10*3/uL (ref 0.0–0.5)
Eosinophils Relative: 1 %
HCT: 49.6 % (ref 39.0–52.0)
Hemoglobin: 16.6 g/dL (ref 13.0–17.0)
Immature Granulocytes: 1 %
Lymphocytes Relative: 13 %
Lymphs Abs: 1.2 10*3/uL (ref 0.7–4.0)
MCH: 31 pg (ref 26.0–34.0)
MCHC: 33.5 g/dL (ref 30.0–36.0)
MCV: 92.7 fL (ref 80.0–100.0)
Monocytes Absolute: 0.7 10*3/uL (ref 0.1–1.0)
Monocytes Relative: 8 %
Neutro Abs: 7.3 10*3/uL (ref 1.7–7.7)
Neutrophils Relative %: 77 %
Platelets: 204 10*3/uL (ref 150–400)
RBC: 5.35 MIL/uL (ref 4.22–5.81)
RDW: 14.4 % (ref 11.5–15.5)
WBC: 9.4 10*3/uL (ref 4.0–10.5)
nRBC: 0 % (ref 0.0–0.2)

## 2023-08-14 LAB — FERRITIN: Ferritin: 49 ng/mL (ref 24–336)

## 2023-08-14 LAB — IRON AND TIBC
Iron: 121 ug/dL (ref 45–182)
Saturation Ratios: 35 % (ref 17.9–39.5)
TIBC: 347 ug/dL (ref 250–450)
UIBC: 226 ug/dL

## 2023-08-24 ENCOUNTER — Other Ambulatory Visit (HOSPITAL_COMMUNITY): Payer: Self-pay

## 2023-08-24 ENCOUNTER — Other Ambulatory Visit: Payer: Self-pay

## 2023-08-27 ENCOUNTER — Other Ambulatory Visit (HOSPITAL_COMMUNITY): Payer: Self-pay

## 2023-08-27 ENCOUNTER — Other Ambulatory Visit: Payer: Self-pay

## 2023-08-27 MED ORDER — MECLIZINE HCL 25 MG PO TABS
25.0000 mg | ORAL_TABLET | Freq: Three times a day (TID) | ORAL | 0 refills | Status: AC
  Filled 2023-08-27: qty 30, 10d supply, fill #0

## 2023-08-31 ENCOUNTER — Telehealth: Payer: Self-pay | Admitting: Gastroenterology

## 2023-08-31 NOTE — Telephone Encounter (Signed)
 Left message on machine to call back - I have also sent a My Chart message he does view his messages  Keep 1/8 office visit with GM

## 2023-08-31 NOTE — Telephone Encounter (Signed)
 Inbound call from patient, requesting information about procedure he was said to have scheduled for February with Dr. Wilhelmenia. He states he needed an EGD with Botox  for his gastroparesis. Patient states he does not see this appointment has been scheduled and would like to discuss further. Please advise.

## 2023-09-02 ENCOUNTER — Encounter: Payer: Self-pay | Admitting: Gastroenterology

## 2023-09-02 ENCOUNTER — Other Ambulatory Visit: Payer: Self-pay

## 2023-09-02 ENCOUNTER — Ambulatory Visit: Payer: Managed Care, Other (non HMO) | Admitting: Gastroenterology

## 2023-09-02 ENCOUNTER — Other Ambulatory Visit: Payer: Self-pay | Admitting: Physician Assistant

## 2023-09-02 ENCOUNTER — Other Ambulatory Visit: Payer: Self-pay | Admitting: Internal Medicine

## 2023-09-02 VITALS — BP 116/74 | HR 74 | Ht 74.0 in | Wt 177.0 lb

## 2023-09-02 DIAGNOSIS — K222 Esophageal obstruction: Secondary | ICD-10-CM

## 2023-09-02 DIAGNOSIS — R110 Nausea: Secondary | ICD-10-CM

## 2023-09-02 DIAGNOSIS — R1310 Dysphagia, unspecified: Secondary | ICD-10-CM

## 2023-09-02 DIAGNOSIS — K3184 Gastroparesis: Secondary | ICD-10-CM

## 2023-09-02 DIAGNOSIS — R1319 Other dysphagia: Secondary | ICD-10-CM

## 2023-09-02 DIAGNOSIS — R1084 Generalized abdominal pain: Secondary | ICD-10-CM

## 2023-09-02 DIAGNOSIS — K5909 Other constipation: Secondary | ICD-10-CM

## 2023-09-02 DIAGNOSIS — R1114 Bilious vomiting: Secondary | ICD-10-CM | POA: Diagnosis not present

## 2023-09-02 DIAGNOSIS — R42 Dizziness and giddiness: Secondary | ICD-10-CM

## 2023-09-02 DIAGNOSIS — D509 Iron deficiency anemia, unspecified: Secondary | ICD-10-CM

## 2023-09-02 MED ORDER — NA SULFATE-K SULFATE-MG SULF 17.5-3.13-1.6 GM/177ML PO SOLN
1.0000 | ORAL | 0 refills | Status: DC
  Filled 2023-09-02: qty 354, 1d supply, fill #0

## 2023-09-02 NOTE — Progress Notes (Signed)
 Labs from 07/27/2023 showed normal hemoglobin, but downtrending ferritin at 49. MyChart message from patient on 09/02/2023 reported increasing lightheadedness, headaches, and cravings for cold fluids. We will schedule patient for IV Venofer 400 mg x 1 dose.

## 2023-09-02 NOTE — Patient Instructions (Addendum)
 You have been scheduled for an endoscopy and colonoscopy. Please follow the written instructions given to you at your visit today.  Please pick up your prep supplies at the pharmacy within the next 1-3 days.  If you use inhalers (even only as needed), please bring them with you on the day of your procedure.  DO NOT TAKE 7 DAYS PRIOR TO TEST- Trulicity (dulaglutide) Ozempic, Wegovy (semaglutide) Mounjaro (tirzepatide) Bydureon Bcise (exanatide extended release)  DO NOT TAKE 1 DAY PRIOR TO YOUR TEST Rybelsus (semaglutide) Adlyxin (lixisenatide) Victoza (liraglutide) Byetta (exanatide) ___________________________________________________________________________  We have sent the following medications to your pharmacy for you to pick up at your convenience: Suprep    1 week prior to your procedure increase your Miralax  to twice daily.   You have been scheduled for a CT scan of the abdomen and pelvis  at [facility] [address] [place], [place] [address] You are scheduled on 09/18/23  at 1:00 pm . You should arrive 2 hours prior to at( 11:00 am )  for registration and to drink oral contrast.   Please follow the written instructions below on the day of your exam:   1) Do not eat anything after 9:00 am  (4 hours prior to your test)    You may take any medications as prescribed with a small amount of water , if necessary. If you take any of the following medications: METFORMIN , GLUCOPHAGE , GLUCOVANCE, AVANDAMET, RIOMET , FORTAMET , ACTOPLUS MET, JANUMET, GLUMETZA  or METAGLIP, you MAY be asked to HOLD this medication 48 hours AFTER the exam.   The purpose of you drinking the oral contrast is to aid in the visualization of your intestinal tract. The contrast solution may cause some diarrhea. Depending on your individual set of symptoms, you may also receive an intravenous injection of x-ray contrast/dye. Plan on being at [facility] for 45  minutes or longer, depending on the type of exam you are having performed.   If you have any questions regarding your exam or if you need to reschedule, you may call [facility] at [phone] between the hours of 8:00 am and 5:00 pm, Monday-Friday.   Due to recent changes in healthcare laws, you may see the results of your imaging and laboratory studies on MyChart before your provider has had a chance to review them.  We understand that in some cases there may be results that are confusing or concerning to you. Not all laboratory results come back in the same time frame and the provider may be waiting for multiple results in order to interpret others.  Please give us  48 hours in order for your provider to thoroughly review all the results before contacting the office for clarification of your results.   Thank you for choosing me and [facility].  Dr. Wilhelmenia

## 2023-09-03 ENCOUNTER — Encounter: Payer: Self-pay | Admitting: Gastroenterology

## 2023-09-03 NOTE — Progress Notes (Signed)
 GASTROENTEROLOGY OUTPATIENT CLINIC VISIT   Primary Care Provider Auston Reyes BIRCH, MD [address] [facility] [place] [address] [phone]  Patient Profile: Hector Curry is a 63 y.o. 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  The patient presents to the [facility] for an evaluation and management of problem(s) noted below:  Problem List 1. Gastroparesis   2. Bilious vomiting with nausea   3. Esophageal stricture   4. Esophageal dysphagia   5. Generalized abdominal pain   6. Iron  deficiency anemia, unspecified iron  deficiency anemia type   7. Chronic constipation    Discussed the use of AI scribe software for clinical note transcription with the patient, who gave verbal consent to proceed.  History of Present Illness Please see prior progress notes for full details of HPI.  Interval History The patient presents for follow-up with his wife.  He continues to report issues of weight loss unintentionally as well as early satiety, nausea and vomiting.  Dysphagia symptoms are not occurring at this time after his last 2 endoscopies.  He had been found to have gastroparesis based on SF-GES but remains concerned about the risks associated with short-term and long-term Reglan  use.  He also reports a more progressive and constant abdominal pain, that occurs throughout his abdomen, since his surgical reversal.  Using a heating pad will help at times.   His eating habits are poor, because he wants to maintain his weight, so he is eating  high-calorie junk foods at times.  He is able to tolerate liquids easier than normal foods due to the nausea that occurs.  He has also been found to be progressively iron  deficient and is receiving IV Iron  infusions.  He is overdue for colonoscopy for surveillance purposes, and now with IDA, he knows there may be indication for further evaluation as well, though has concerns about tolerating the preparation.  His constipation is better with MiraLAX  use daily.  The patient's symptoms and his impact on daily life have led to a willingness to consider other treatment options, including Botox  injection to the pylorus (I had discussed this previously at this last EGD) or anything else that is not Reglan .   GI Review of Systems Positive as above Negative for dysphagia, odynophagia, melena, hematochezia, pain   Review of Systems General: Denies fevers/chills/unintentional weight loss Cardiovascular: Denies chest pain Pulmonary: Denies shortness of breath Gastroenterological: See HPI Genitourinary: Denies darkened urine Hematological: Positive for easy bruising/bleeding due to antiplatelet therapy Dermatological: Denies jaundice Psychological: Mood is frustrated and tired   Medications Current Outpatient Medications  Medication Sig Dispense Refill   cetirizine  (ZYRTEC ) 10 MG tablet Take by mouth.     clopidogrel  (PLAVIX ) 75 MG tablet Take 1 tablet (75 mg total) by mouth daily. 90 tablet 1   Continuous Glucose Sensor (FREESTYLE LIBRE 14 DAY SENSOR) MISC Insert 1 sensor to the back of the arm as directed every 14 days for continuous glucose monitoring 6 each 1   fluticasone  (FLONASE ) 50 MCG/ACT nasal spray Place 2-3 sprays into both nostrils daily as needed for allergies or rhinitis.     insulin  lispro (HUMALOG ) 100 UNIT/ML KwikPen Inject subcutaneously 3 (three) times daily with meals Per sliding  scale (Patient taking differently: Inject 3-7 Units into the skin daily as needed (For high blood sugar).)  15 mL 1   Insulin  Pen Needle (UNIFINE PENTIPS) 32G X 4 MM MISC Use as needed as directed 100 each 3   mometasone  (ELOCON ) 0.1 % lotion Apply to affected areas once daily (Patient taking differently: Apply 1 Application topically as needed (psoriasis).) 60 mL 5   Na Sulfate-K Sulfate-Mg Sulf (SUPREP BOWEL PREP KIT) 17.5-3.13-1.6 GM/177ML SOLN Take 1 kit by mouth as directed. For colonoscopy prep 354 mL 0   propranolol  (INDERAL ) 20 MG tablet Take 1 tablet (20 mg total) by mouth 3 (three) times daily. (Patient taking differently: Take 20 mg by mouth at bedtime.) 90 tablet 11   propranolol  40 MG/5ML solution Take 5 mg by mouth every evening. As needed     Propylene Glycol (SYSTANE BALANCE OP) Place 2-3 drops into both eyes daily as needed (dry eyes).     Pseudoeph-Doxylamine-DM-APAP (NYQUIL PO) Take 2 tablets by mouth at bedtime as needed (pain).     RABEprazole  (ACIPHEX ) 20 MG tablet Take 1 tablet (20 mg total) by mouth in the morning and at bedtime. 60 tablet 6   VITAMIN E PO Take 1 tablet by mouth daily.     meclizine  (ANTIVERT ) 25 MG tablet Take 1 tablet (25 mg total) by mouth 3 (three) times daily for 10 days. (Patient not taking: Reported on 09/02/2023) 30 tablet 0   metoCLOPramide  (REGLAN ) 5 MG tablet Take 1 tablet (5 mg total) by mouth at bedtime. (Patient not taking: Reported on 09/02/2023) 60 tablet 1   Multiple Vitamin (MULTI-VITAMIN DAILY PO) Take 1 patch by mouth daily. (Patient not taking: Reported on 09/02/2023)     nitroGLYCERIN  (NITROSTAT ) 0.4 MG SL tablet Place 1 tablet (0.4 mg total) under the tongue every 5 (five) minutes as needed for chest pain. (Patient not taking: Reported on 09/02/2023) 25 tablet 3   ondansetron  (ZOFRAN -ODT) 4 MG disintegrating tablet Take 1 tablet (4 mg total) by mouth every 6 (six) hours as needed for nausea or vomiting. (Patient not taking: Reported on 09/02/2023) 20 tablet 0   OVER THE COUNTER MEDICATION Apply 1 Application topically daily at 6 (six) AM. Medication:  Bariatric patch (Patient not taking: Reported on 09/02/2023)     prochlorperazine  (COMPAZINE ) 25 MG suppository Place 1 suppository (25 mg total) rectally every 12 (twelve) hours as needed for nausea or vomiting. (Patient not taking: Reported on 09/02/2023) 6 suppository 0   prochlorperazine  (COMPAZINE ) 5 MG tablet Take 1 tablet (5 mg total) by mouth every 6 (six) hours as needed for nausea or vomiting. (Patient not taking: Reported on 09/02/2023) 30 tablet 0   senna (SENOKOT) 8.6 MG tablet Take 1 tablet by mouth 2 (two) times daily. (Patient not taking: Reported on 09/02/2023)     No current facility-administered medications for this visit.    Allergies Allergies  Allergen Reactions   Other Itching and Other (See Comments)    Cats Wheezing, runny nose/eyes, congestion     Histories Past Medical History:  Diagnosis Date   Allergic rhinitis    Allergy    Atrial tachycardia (HCC)    CAD (coronary artery disease)    a. 05/08/2015 Cath: no significant CAD, LVEF nl-->Med; b. 07/2017 MV: attenuation artifact, no ischemia, EF 65%-->Low risk; c. 03/2021 NSTEMI/PCI: LM nl, LAD 80p (3.0x18 Onyx Frontier DES), D1 90 (too small for PCI), LCX nl, RCA nl.   Diabetes mellitus without complication (HCC)    Diastolic  dysfunction    a. 04/2015 Echo: EF 60-65%; b. 05/2016 Echo: EF 60-65%, GrI DD; c. 07/2017 Echo: EF 55-60%, Ao root 42mm; d. 03/2019 Echo: EF 55-60%, Ao root/Asc Ao 42mm; e. 03/2021 Echo: EF 50-55%, apical HK, mod asymm basal-septal hypertrophy. Nl RV fxn. Asc Ao 44mm.   Dilated aortic root (HCC)    a. 07/2017 Echo: 42mm Ao root - mildly dil; b. 03/2019 Echo: Ao root 42mm; c. 03/2021 Echo: Asc Ao 44mm.   Diverticulosis 10/03/2014   Dysrhythmia    Esophageal stricture    a.  In setting of lap band June 2022-stricture at the GJ anastomosis requiring gastrostomy tube; b. 05/2021 s/p esoph stenting (15mm); c. 07/2021 s/p esoph stenting (20mm).   Family history of premature CAD    a. father passed from MI  at 81   GERD (gastroesophageal reflux disease)    GIB (gastrointestinal bleeding)    a. 07/2021 following esoph stenting.   Hemorrhoids    a. internal hemorrhoids s/p surgery 1999   History of kidney stones    History of tobacco abuse    Hyperlipidemia    Hyperplastic colon polyp 10/03/2014   a. x 2    Hypertension    Inflammatory arthritis    a. CCP antibodies & x-rays negative. Rheumatoid factor 14, felt to be crystaline over RA or psoriatic   Iron  deficiency anemia 02/05/2022   Morbid obesity (HCC)    a. s/p LAP-BAND - complicated by esoph stricture.   Myocardial infarction [facility])    NSTEMI   Nausea and vomiting 04/11/2023   OSA (obstructive sleep apnea)    a. on CPAP   Osteoarthritis    PSVT (paroxysmal supraventricular tachycardia) (HCC)    a. 48 hr Holter 04/2015: NSR w/ rare PVC, short runs of narrow complex tachycardiac, possible atrial tach, longest run 7 beats, PACs noted (2% of all beats 3600 total) they did not seem to correlate w/ significant arrythmia; b. 08/2017 Event monitor: no significant arrhythmias.   Sleep apnea    Past Surgical History:  Procedure Laterality Date   BALLOON DILATION N/A 08/14/2021   Procedure: BALLOON DILATION;  Surgeon: Mansouraty, Aloha Raddle., MD;  Location: [facility];  Service: Gastroenterology;  Laterality: N/A;   BALLOON DILATION N/A 02/20/2022   Procedure: BALLOON DILATION;  Surgeon: Teressa Toribio SQUIBB, MD;  Location: [facility];  Service: Gastroenterology;  Laterality: N/A;   BALLOON DILATION N/A 03/27/2022   Procedure: BALLOON DILATION;  Surgeon: Wilhelmenia Aloha Raddle., MD;  Location: [facility] ENDOSCOPY;  Service: Gastroenterology;  Laterality: N/A;   BARIATRIC SURGERY  01/2021   lap band    BILIARY STENT PLACEMENT N/A 08/14/2021   Procedure: AXIOS STENT PLACEMENT;  Surgeon: Wilhelmenia Aloha Raddle., MD;  [facility];  Service: Gastroenterology;  Laterality: N/A;   BILIARY STENT PLACEMENT N/A 10/16/2022   Procedure: STENT  PLACEMENT;  Surgeon: Wilhelmenia Aloha Raddle., MD;  Location: [facility];  Service: Gastroenterology;  Laterality: N/A;   BILIARY STENT PLACEMENT N/A 04/09/2023   Procedure: AXIOS STENT PLACEMENT;  Surgeon: Wilhelmenia Aloha Raddle., MD;  Location: [facility];  Service: Gastroenterology;  Laterality: N/A;   BIOPSY  07/08/2021   Procedure: BIOPSY;  Surgeon: Wilhelmenia Aloha Raddle., MD;  Location: [facility];  Service: Gastroenterology;;   BIOPSY  08/14/2021   Procedure: BIOPSY;  Surgeon: Wilhelmenia Aloha Raddle., MD;  Location: [facility];  Service: Gastroenterology;;   BIOPSY  10/16/2022   Procedure: BIOPSY;  Surgeon: Wilhelmenia Aloha Raddle., MD;  Location: [facility];  Service: Gastroenterology;;  CARDIAC CATHETERIZATION N/A 05/08/2015   Procedure: Left Heart Cath and Coronary Angiography;  Surgeon: Candyce GORMAN Reek, MD;  Location: [facility] INVASIVE CV LAB;  Service: Cardiovascular;  Laterality: N/A;   CARPAL TUNNEL RELEASE Bilateral    CHOLECYSTECTOMY     COLONOSCOPY  06/27/2005   COLONOSCOPY  10/03/2014   CORONARY STENT INTERVENTION N/A 04/15/2021   Procedure: CORONARY STENT INTERVENTION;  Surgeon: Court Dorn PARAS, MD;  Location: [facility] INVASIVE CV LAB;  Service: Cardiovascular;  Laterality: N/A;   DUODENAL STENT PLACEMENT N/A 11/18/2021   Procedure: DUODENAL STENT PLACEMENT;  Surgeon: Wilhelmenia Aloha Raddle., MD;  Location: [facility];  Service: Gastroenterology;  Laterality: N/A;  axios placed at GJ anastamosis   DUODENAL STENT PLACEMENT  03/27/2022   Procedure: GASTRIC STENT PLACEMENT;  Surgeon: Wilhelmenia Aloha Raddle., MD;  Location: [facility] ENDOSCOPY;  Service: Gastroenterology;;   ESOPHAGEAL DILATION  05/27/2021   Procedure: ESOPHAGEAL DILATION;  Surgeon: Wilhelmenia Aloha Raddle., MD;  Location: [facility];  Service: Gastroenterology;;   ESOPHAGEAL DILATION  01/23/2022   Procedure: ESOPHAGEAL DILATION;  Surgeon: Wilhelmenia Aloha Raddle., MD;  Location: [facility] ENDOSCOPY;  Service:  Gastroenterology;;   ESOPHAGEAL DILATION  04/09/2023   Procedure: ESOPHAGEAL DILATION;  Surgeon: Wilhelmenia Aloha Raddle., MD;  Location: [facility];  Service: Gastroenterology;;   ESOPHAGEAL STENT PLACEMENT N/A 05/27/2021   Procedure: ESOPHAGEAL STENT PLACEMENT;  Surgeon: Wilhelmenia Aloha Raddle., MD;  [facility];  Service: Gastroenterology;  Laterality: N/A;   ESOPHAGOGASTRODUODENOSCOPY  06/27/2005   ESOPHAGOGASTRODUODENOSCOPY N/A 03/19/2021   Procedure: ESOPHAGOGASTRODUODENOSCOPY (EGD);  Surgeon: Stevie, Herlene Righter, MD;  Location: [facility];  Service: General;  Laterality: N/A;   ESOPHAGOGASTRODUODENOSCOPY N/A 08/16/2021   Procedure: ESOPHAGOGASTRODUODENOSCOPY (EGD);  Surgeon: Wilhelmenia Aloha Raddle., MD;  Location: [facility] ENDOSCOPY;  Service: Gastroenterology;  Laterality: N/A;   ESOPHAGOGASTRODUODENOSCOPY (EGD) WITH PROPOFOL  N/A 05/27/2021   Procedure: ESOPHAGOGASTRODUODENOSCOPY (EGD) WITH PROPOFOL ;  Surgeon: Wilhelmenia Aloha Raddle., MD;  [facility];  Service: Gastroenterology;  Laterality: N/A;   ESOPHAGOGASTRODUODENOSCOPY (EGD) WITH PROPOFOL  N/A 07/08/2021   Procedure: ESOPHAGOGASTRODUODENOSCOPY (EGD) WITH PROPOFOL ;  Surgeon: Wilhelmenia Aloha Raddle., MD;  Location: [facility];  Service: Gastroenterology;  Laterality: N/A;  fluoro   ESOPHAGOGASTRODUODENOSCOPY (EGD) WITH PROPOFOL  N/A 08/14/2021   Procedure: ESOPHAGOGASTRODUODENOSCOPY (EGD) WITH PROPOFOL ;  Surgeon: Wilhelmenia Aloha Raddle., MD;  [facility]: [facility];  Service: Gastroenterology;  Laterality: N/A;  fluoro Axios stent (20 mm)   ESOPHAGOGASTRODUODENOSCOPY (EGD) WITH PROPOFOL  N/A 10/16/2021   Procedure: ESOPHAGOGASTRODUODENOSCOPY (EGD) WITH PROPOFOL ;  Surgeon: Wilhelmenia Aloha Raddle., MD;  [facility];  Service: Gastroenterology;  Laterality: N/A;  axios stent pull fluoro   ESOPHAGOGASTRODUODENOSCOPY (EGD) WITH PROPOFOL  N/A 11/18/2021   Procedure: ESOPHAGOGASTRODUODENOSCOPY (EGD) WITH  PROPOFOL ;  Surgeon: Wilhelmenia Aloha Raddle., MD;  [facility];  Service: Gastroenterology;  Laterality: N/A;   ESOPHAGOGASTRODUODENOSCOPY (EGD) WITH PROPOFOL  N/A 01/23/2022   Procedure: ESOPHAGOGASTRODUODENOSCOPY (EGD) WITH PROPOFOL ;  Surgeon: Wilhelmenia Aloha Raddle., MD;  Location: [facility] ENDOSCOPY;  Service: Gastroenterology;  Laterality: N/A;   ESOPHAGOGASTRODUODENOSCOPY (EGD) WITH PROPOFOL  N/A 02/20/2022   Procedure: ESOPHAGOGASTRODUODENOSCOPY (EGD) WITH PROPOFOL ;  Surgeon: Teressa Toribio SQUIBB, MD;  [facility];  Service: Gastroenterology;  Laterality: N/A;   ESOPHAGOGASTRODUODENOSCOPY (EGD) WITH PROPOFOL  N/A 03/27/2022   Procedure: ESOPHAGOGASTRODUODENOSCOPY (EGD) WITH PROPOFOL ;  Surgeon: Wilhelmenia Aloha Raddle., MD;  Location: [facility] ENDOSCOPY;  Service: Gastroenterology;  Laterality: N/A;   ESOPHAGOGASTRODUODENOSCOPY (EGD) WITH PROPOFOL  N/A 08/14/2022   Procedure: ESOPHAGOGASTRODUODENOSCOPY (EGD) WITH PROPOFOL ;  Surgeon: Wilhelmenia Aloha Raddle., MD;  [facility];  Service: Gastroenterology;  Laterality: N/A;   ESOPHAGOGASTRODUODENOSCOPY (  EGD) WITH PROPOFOL  N/A 10/16/2022   Procedure: ESOPHAGOGASTRODUODENOSCOPY (EGD) WITH PROPOFOL ;  Surgeon: Wilhelmenia Aloha Raddle., MD;  [facility];  Service: Gastroenterology;  Laterality: N/A;   ESOPHAGOGASTRODUODENOSCOPY (EGD) WITH PROPOFOL  N/A 11/28/2022   Procedure: ESOPHAGOGASTRODUODENOSCOPY (EGD) WITH PROPOFOL  WITH AXIOS STENT REMOVAL;  Surgeon: Wilhelmenia Aloha Raddle., MD;  Location: [facility] ENDOSCOPY;  Service: Gastroenterology;  Laterality: N/A;   ESOPHAGOGASTRODUODENOSCOPY (EGD) WITH PROPOFOL  N/A 04/09/2023   Procedure: ESOPHAGOGASTRODUODENOSCOPY (EGD) WITH PROPOFOL ;  Surgeon: Wilhelmenia Aloha Raddle., MD;  [facility];  Service: Gastroenterology;  Laterality: N/A;  dilation - axios stent   ESOPHAGOGASTRODUODENOSCOPY (EGD) WITH PROPOFOL  N/A 04/11/2023   Procedure: ESOPHAGOGASTRODUODENOSCOPY (EGD) WITH PROPOFOL ;   Surgeon: Albertus Gordy HERO, MD;  [facility]: [facility];  Service: Gastroenterology;  Laterality: N/A;   ESOPHAGOGASTRODUODENOSCOPY (EGD) WITH PROPOFOL  N/A 04/30/2023   Procedure: ESOPHAGOGASTRODUODENOSCOPY (EGD) WITH PROPOFOL ;  Surgeon: Wilhelmenia Aloha Raddle., MD;  Location[facility];  Service: Gastroenterology;  Laterality: N/A;  axios stent pull   FLEXIBLE SIGMOIDOSCOPY N/A 08/16/2021   Procedure: FLEXIBLE SIGMOIDOSCOPY;  Surgeon: Wilhelmenia Aloha Raddle., MD;  Location: [facility] ENDOSCOPY;  Service: Gastroenterology;  Laterality: N/A;   FOREIGN BODY REMOVAL  02/20/2022   Procedure: FOREIGN BODY REMOVAL;  Surgeon: Teressa Toribio SQUIBB, MD;  [facility];  Service: Gastroenterology;;   FOREIGN BODY REMOVAL ESOPHAGEAL  03/27/2022   Procedure: REMOVAL FOREIGN BODY;  Surgeon: Wilhelmenia Aloha Raddle., MD;  Location: [facility] ENDOSCOPY;  Service: Gastroenterology;;   GASTRIC ROUX-EN-Y N/A 02/18/2021   Procedure: LAPAROSCOPIC ROUX-EN-Y GASTRIC BYPASS WITH UPPER ENDOSCOPY,;  Surgeon: Stevie Herlene Righter, MD;  Location: [facility];  Service: General;  Laterality: N/A;   GASTRIC ROUX-EN-Y N/A 12/02/2022   Procedure: LAPAROSCOPIC converted to OPEN GASTRIC BYPASS REVERSAL, TAKEDOWN OF GASTROJEJUNAL ANASTOMOSIS, GASTRO GASTRIC ANASTOMOSIS WITH UPPER ENDOSCOPY 1. Laparoscopic lysis of adhesions 2. Partial gastrectomy 3. Small bowel resection 4. Creation of esophagus to gastric anastomosis 5. Creation of gastrostomy;  Surgeon: Kinsinger, Herlene Righter, MD;  Location: [facility];  Service: General;  Laterality:    GASTROINTESTINAL STENT REMOVAL  01/23/2022   Procedure: JUSTINA CARLS REMOVAL;  Surgeon: Wilhelmenia Aloha Raddle., MD;  Location: [facility] ENDOSCOPY;  Service: Gastroenterology;;   GASTROINTESTINAL STENT REMOVAL N/A 04/30/2023   Procedure: GASTROINTESTINAL STENT REMOVAL;  Surgeon: Wilhelmenia Aloha Raddle., MD;  Location: [facility];  Service: Gastroenterology;  Laterality: N/A;   GASTROSTOMY N/A 12/02/2022   Procedure: INSERTION OF  GASTROSTOMY TUBE;  Surgeon: Stevie Herlene Righter, MD;  Location: [facility];  Service: General;  Laterality: N/A;   HEMORRHOID SURGERY  08/25/1997   IR GJ TUBE CHANGE  03/27/2021   LAPAROSCOPIC INSERTION GASTROSTOMY TUBE N/A 03/20/2021   Procedure: LAPAROSCOPIC INSERTION GASTROSTOMY TUBE;  Surgeon: Stevie Herlene Righter, MD;  Location: [facility];  Service: General;  Laterality: N/A;   LEFT HEART CATH AND CORONARY ANGIOGRAPHY N/A 04/15/2021   Procedure: LEFT HEART CATH AND CORONARY ANGIOGRAPHY;  Surgeon: Court Dorn PARAS, MD;  Location: [facility] INVASIVE CV LAB;  Service: Cardiovascular;  Laterality: N/A;   MOUTH SURGERY     removed area which was benign   SCLEROTHERAPY  04/30/2023   Procedure: SCLEROTHERAPY;  Surgeon: Mansouraty, Aloha Raddle., MD;  Location: [facility];  Service: Gastroenterology;;   CARLS REMOVAL  07/08/2021   Procedure: CARLS REMOVAL;  Surgeon: Wilhelmenia Aloha Raddle., MD;  Location: [facility];  Service: Gastroenterology;;   CARLS REMOVAL  10/16/2021   Procedure: STENT REMOVAL;  Surgeon: Wilhelmenia Aloha Raddle., MD;  Location: [facility];  Service: Gastroenterology;;   CARLS REMOVAL  08/14/2022  Procedure: STENT REMOVAL;  Surgeon: Wilhelmenia Aloha Raddle., MD;  Location: [facility];  Service: Gastroenterology;;   CLEDA REMOVAL  11/28/2022   Procedure: STENT REMOVAL;  Surgeon: Wilhelmenia Aloha Raddle., MD;  Location: [facility] ENDOSCOPY;  Service: Gastroenterology;;   CLEDA REMOVAL  04/11/2023   Procedure: STENT REMOVAL;  Surgeon: Albertus Gordy HERO, MD;  Location: [facility];  Service: Gastroenterology;;   STERIOD INJECTION  01/23/2022   Procedure: STEROID INJECTION;  Surgeon: Wilhelmenia Aloha Raddle., MD;  Location: [facility] ENDOSCOPY;  Service: Gastroenterology;;   SUBMUCOSAL INJECTION  11/18/2021   Procedure: SUBMUCOSAL INJECTION;  Surgeon: Wilhelmenia Aloha Raddle., MD;  Location: [facility];  Service: Gastroenterology;;   SUBMUCOSAL INJECTION  08/14/2022   Procedure: SUBMUCOSAL INJECTION;   Surgeon: Wilhelmenia Aloha Raddle., MD;  Location: [facility];  Service: Gastroenterology;;   TONSILLECTOMY     UPPER GI ENDOSCOPY N/A 03/20/2021   Procedure: UPPER GI ENDOSCOPY;  Surgeon: Kinsinger, Herlene Righter, MD;  Location: [facility];  Service: General;  Laterality: N/A;   Social History   Socioeconomic History   Marital status: Married    Spouse name: Not on file   Number of children: 3   Years of education: Not on file   Highest education level: Not on file  Occupational History   Occupation: IT It Trainer  Tobacco Use   Smoking status: Former    Current packs/day: 0.00    Average packs/day: 1 pack/day for 15.0 years (15.0 ttl pk-yrs)    Types: Cigarettes    Start date: 08/05/1974    Quit date: 08/05/1989    Years since quitting: 34.1   Smokeless tobacco: Never  Vaping Use   Vaping status: Never Used  Substance and Sexual Activity   Alcohol use: Not Currently    Comment: occ   Drug use: Never   Sexual activity: Not Currently  Other Topics Concern   Not on file  Social History Narrative   Not on file   Social Drivers of Health   Financial Resource Strain: Low Risk  (06/12/2023)   Received from [facility]   Overall Financial Resource Strain (CARDIA)    Difficulty of Paying Living Expenses: Not hard at all  Food Insecurity: No Food Insecurity (06/12/2023)   Received from [facility]   Hunger Vital Sign    Worried About Running Out of Food in the Last Year: Never true    Ran Out of Food in the Last Year: Never true  Transportation Needs: No Transportation Needs (06/12/2023)   Received from [facility] - Transportation    In the past 12 months, has lack of transportation kept you from medical appointments or from getting medications?: No    Lack of Transportation (Non-Medical): No  Physical Activity: Not on file  Stress: Not on file  Social Connections: Unknown (04/14/2023)   Received from [facility]    Social Network    Social Network: Not on file  Intimate Partner Violence: Unknown (04/14/2023)   Received from [facility]   HITS    Physically Hurt: Not on file    Insult or Talk Down To: Not on file    Threaten Physical Harm: Not on file    Scream or Curse: Not on file   Family History  Problem Relation Age of Onset   Hypertension Mother    Diabetes Mother    Heart attack Father 66   CAD Father    Hypertension Sister    Diabetes Other  Hypertension Other    Heart disease Other    Colon cancer Neg Hx    Esophageal cancer Neg Hx    Pancreatic cancer Neg Hx    Stomach cancer Neg Hx    Liver disease Neg Hx    Inflammatory bowel disease Neg Hx    Rectal cancer Neg Hx    I have reviewed his medical, social, and family history in detail and updated the electronic medical record as necessary.    PHYSICAL EXAMINATION  BP 116/74   Pulse 74   Ht 6' 2 (1.88 m)   Wt 177 lb (80.3 kg)   SpO2 93%   BMI 22.73 kg/m  Wt Readings from Last 3 Encounters:  09/02/23 177 lb (80.3 kg)  07/21/23 198 lb (89.8 kg)  06/16/23 200 lb (90.7 kg)  GEN: NAD, appears stated age, doesn't appear chronically ill PSYCH: Cooperative, without pressured speech EYE: Conjunctivae pink, sclerae anicteric ENT: MMM CV: Nontachycardic RESP: No audible wheezing GI: NABS, soft, protuberant abdomen, surgical scars present, without rebound MSK/EXT: No significant lower extremity edema SKIN: No jaundice NEURO:  Alert & Oriented x 3, no focal deficits   REVIEW OF DATA  I reviewed the following data at the time of this encounter:  GI Procedures and Studies  November 2024 EGD - No gross lesions in the proximal esophagus and in the mid esophagus. - LA Grade A esophagitis with no bleeding found distally. - Benign-appearing esophageal stenosis at the anastomosis. Dilated to 18 mm savory with mucosal wrent noted. - A medium amount of food (residue) in the stomach. - No other gross lesions in the entire  stomach. - Normal pylorus. - No gross lesions in the duodenal bulb, in the first portion of the duodenum and in the second portion of the duodenum.  Laboratory Studies  Reviewed those in epic   Imaging Studies  November 2024 gastric emptying IMPRESSION: Scintigraphic findings of delayed gastric emptying.    ASSESSMENT  Mr. Verge is a 63 y.o. 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  The patient is seen today for evaluation and management of:  1. Gastroparesis   2. Bilious vomiting with nausea   3. Esophageal stricture   4. Esophageal dysphagia   5. Generalized abdominal pain   6. Iron  deficiency anemia, unspecified iron  deficiency anemia type   7. Chronic constipation    The patient is hemodynamically stable.  Clinically, however, the patient is experiencing significant symptomatology from the recently diagnosed gastroparesis.  We have finally been able to get his esophagogastric anastomosis stricture improved with dilations.  He has significant concerns about the use of Reglan  and is not wanting to trial this even with as needed dosing.  I think it is worth considering Botox  to see if he has some improvement.  Can consider referral to quaternary center to consider G POEM versus gastric stimulator placement.  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  Endoscopy will be pursued with Botox  placement.  If we still  have no finding for the iron  deficiency, he will require a video capsule endoscopy.  The risks and benefits of endoscopic evaluation were discussed with the patient; these include but are not limited to the risk of perforation, infection, bleeding, missed lesions, lack of diagnosis, severe illness requiring hospitalization, as well as anesthesia and sedation related illnesses.  The patient and/or family is agreeable to proceed.   For the patient's discomfort, this could be gastroparesis related, but we have not had any imaging since his surgery, so we are going to move forward with cross-sectional imaging to rule out other etiologies for his abdominal pain all patient questions were answered to the best of my ability, and the patient agrees to the aforementioned plan of action with follow-up as indicated.   PLAN  Proceed with scheduling EGD with Botox  pylorus for gastroparesis Proceed with scheduled colonoscopy at same time Continue IV iron  infusions as you are doing Get Plavix  hold for procedures Consider referral to quaternary center for GI: First gastric stimulator placement Gastroparesis diet Proceed with CT abdomen pelvis with contrast   Orders Placed This Encounter  Procedures   Procedural/ Surgical Case Request: ESOPHAGOGASTRODUODENOSCOPY (EGD) WITH PROPOFOL , BOTOX  INJECTION, COLONOSCOPY WITH PROPOFOL    CT ABDOMEN PELVIS W CONTRAST   Ambulatory referral to Gastroenterology    New Prescriptions   NA SULFATE-K SULFATE-MG SULF (SUPREP BOWEL PREP KIT) 17.5-3.13-1.6 GM/177ML SOLN    Take 1 kit by mouth as directed. For colonoscopy prep   Modified Medications   No medications on file    Planned Follow Up No follow-ups on file.    Total Time in Face-to-Face and in Coordination of Care for patient including independent/personal interpretation/review of prior testing, medical history, examination, medication  adjustment, communicating results with the patient directly, and documentation with the EHR is 30 minutes.  Aloha Finner, MD [facility] Office # [phone]

## 2023-09-04 ENCOUNTER — Inpatient Hospital Stay: Payer: Managed Care, Other (non HMO) | Attending: Hematology

## 2023-09-04 VITALS — BP 151/97 | HR 74 | Temp 98.2°F | Resp 17

## 2023-09-04 DIAGNOSIS — D509 Iron deficiency anemia, unspecified: Secondary | ICD-10-CM | POA: Insufficient documentation

## 2023-09-04 DIAGNOSIS — D508 Other iron deficiency anemias: Secondary | ICD-10-CM

## 2023-09-04 MED ORDER — CETIRIZINE HCL 10 MG PO TABS: 10.0000 mg | ORAL_TABLET | Freq: Once | ORAL | Status: DC

## 2023-09-04 MED ORDER — SODIUM CHLORIDE 0.9 % IV SOLN
INTRAVENOUS | Status: DC
Start: 2023-09-04 — End: 2023-09-04

## 2023-09-04 MED ORDER — SODIUM CHLORIDE 0.9 % IV SOLN
400.0000 mg | Freq: Once | INTRAVENOUS | Status: AC
  Administered 2023-09-04: 400 mg via INTRAVENOUS
  Filled 2023-09-04: qty 400

## 2023-09-04 MED ORDER — ACETAMINOPHEN 325 MG PO TABS
650.0000 mg | ORAL_TABLET | Freq: Once | ORAL | Status: AC
  Administered 2023-09-04: 650 mg via ORAL
  Filled 2023-09-04: qty 2

## 2023-09-04 NOTE — Progress Notes (Signed)
 Patient tolerated iron infusion with no complaints voiced.  Peripheral IV site clean and dry with good blood return noted before and after infusion.  Band aid applied.  VSS with discharge and left in satisfactory condition with no s/s of distress noted.

## 2023-09-04 NOTE — Patient Instructions (Signed)
 Iron Sucrose Injection What is this medication? IRON SUCROSE (EYE ern SOO krose) treats low levels of iron (iron deficiency anemia) in people with kidney disease. Iron is a mineral that plays an important role in making red blood cells, which carry oxygen from your lungs to the rest of your body. This medicine may be used for other purposes; ask your health care provider or pharmacist if you have questions. COMMON BRAND NAME(S): Venofer What should I tell my care team before I take this medication? They need to know if you have any of these conditions: Anemia not caused by low iron levels Heart disease High levels of iron in the blood Kidney disease Liver disease An unusual or allergic reaction to iron, other medications, foods, dyes, or preservatives Pregnant or trying to get pregnant Breastfeeding How should I use this medication? This medication is for infusion into a vein. It is given in a hospital or clinic setting. Talk to your care team about the use of this medication in children. While this medication may be prescribed for children as young as 2 years for selected conditions, precautions do apply. Overdosage: If you think you have taken too much of this medicine contact a poison control center or emergency room at once. NOTE: This medicine is only for you. Do not share this medicine with others. What if I miss a dose? Keep appointments for follow-up doses. It is important not to miss your dose. Call your care team if you are unable to keep an appointment. What may interact with this medication? Do not take this medication with any of the following: Deferoxamine Dimercaprol Other iron products This medication may also interact with the following: Chloramphenicol Deferasirox This list may not describe all possible interactions. Give your health care provider a list of all the medicines, herbs, non-prescription drugs, or dietary supplements you use. Also tell them if you smoke,  drink alcohol, or use illegal drugs. Some items may interact with your medicine. What should I watch for while using this medication? Visit your care team regularly. Tell your care team if your symptoms do not start to get better or if they get worse. You may need blood work done while you are taking this medication. You may need to follow a special diet. Talk to your care team. Foods that contain iron include: whole grains/cereals, dried fruits, beans, or peas, leafy green vegetables, and organ meats (liver, kidney). What side effects may I notice from receiving this medication? Side effects that you should report to your care team as soon as possible: Allergic reactions--skin rash, itching, hives, swelling of the face, lips, tongue, or throat Low blood pressure--dizziness, feeling faint or lightheaded, blurry vision Shortness of breath Side effects that usually do not require medical attention (report to your care team if they continue or are bothersome): Flushing Headache Joint pain Muscle pain Nausea Pain, redness, or irritation at injection site This list may not describe all possible side effects. Call your doctor for medical advice about side effects. You may report side effects to FDA at 1-800-FDA-1088. Where should I keep my medication? This medication is given in a hospital or clinic. It will not be stored at home. NOTE: This sheet is a summary. It may not cover all possible information. If you have questions about this medicine, talk to your doctor, pharmacist, or health care provider.  2024 Elsevier/Gold Standard (2023-01-16 00:00:00)

## 2023-09-06 DIAGNOSIS — K3184 Gastroparesis: Secondary | ICD-10-CM | POA: Insufficient documentation

## 2023-09-06 DIAGNOSIS — K5909 Other constipation: Secondary | ICD-10-CM | POA: Insufficient documentation

## 2023-09-06 DIAGNOSIS — R1084 Generalized abdominal pain: Secondary | ICD-10-CM | POA: Insufficient documentation

## 2023-09-07 ENCOUNTER — Other Ambulatory Visit (HOSPITAL_COMMUNITY): Payer: Self-pay

## 2023-09-09 ENCOUNTER — Other Ambulatory Visit (HOSPITAL_BASED_OUTPATIENT_CLINIC_OR_DEPARTMENT_OTHER): Payer: Managed Care, Other (non HMO)

## 2023-09-18 ENCOUNTER — Ambulatory Visit
Admission: RE | Admit: 2023-09-18 | Discharge: 2023-09-18 | Disposition: A | Discharge status: Home / Self Care | Payer: Managed Care, Other (non HMO) | Source: Ambulatory Visit | Attending: Gastroenterology | Admitting: Gastroenterology

## 2023-09-18 DIAGNOSIS — D509 Iron deficiency anemia, unspecified: Secondary | ICD-10-CM

## 2023-09-18 DIAGNOSIS — K5909 Other constipation: Secondary | ICD-10-CM

## 2023-09-18 DIAGNOSIS — K3184 Gastroparesis: Secondary | ICD-10-CM

## 2023-09-18 DIAGNOSIS — R1084 Generalized abdominal pain: Secondary | ICD-10-CM | POA: Diagnosis present

## 2023-09-18 MED ORDER — BARIUM SULFATE 2 % PO SUSP: 450.0000 mL | Freq: Once | ORAL | Status: DC

## 2023-09-18 MED ORDER — IOHEXOL 300 MG/ML  SOLN
80.0000 mL | Freq: Once | INTRAMUSCULAR | Status: AC | PRN
  Administered 2023-09-18: 80 mL via INTRAVENOUS

## 2023-09-18 MED ORDER — BARIUM SULFATE 2 % PO SUSP
450.0000 mL | Freq: Once | ORAL | Status: AC
  Administered 2023-09-18: 450 mL via ORAL

## 2023-09-21 ENCOUNTER — Telehealth: Payer: Self-pay

## 2023-09-21 NOTE — Telephone Encounter (Signed)
Request for surgical clearance:     Endoscopy Procedure  What type of surgery is being performed?     EGD with Botox   When is this surgery scheduled?     10/27/23  What type of clearance is required ?   Pharmacy  Are there any medications that need to be held prior to surgery and how long? Plavix x5 days prior to procedure  Practice name and name of physician performing surgery?      [facility]  What is your office phone and fax number?      Phone- [phone]  Fax- [phone]  Anesthesia type (None, local, MAC, general) ?       MAC  Please route your response to [facility]

## 2023-09-23 NOTE — Telephone Encounter (Signed)
Yes, patient can take ASA 81 mg once daily while off of Clopidogrel, per our office protocol.

## 2023-09-23 NOTE — Telephone Encounter (Signed)
Is bleeding risk acceptable enough to switch to ASA 81 mg once daily while off of Clopidogrel? Tereso Newcomer, PA-C    09/23/2023 7:26 AM

## 2023-09-23 NOTE — Telephone Encounter (Signed)
   Patient Name: Hector Curry  DOB: [birth year] MRN: [id number]  Primary Cardiologist: Marjo Bicker, MD  Patient's past medical history, labs, and current medications have been reviewed as part of preoperative protocol coverage. The following recommendations have been made:  Plavix can be held for 5 days prior to procedure.  Patient will need to start aspirin 81 mg once daily while he is not taking Plavix.  Aspirin 81 mg daily should be continued through the perioperative period. Please resume Plavix when felt safe to do so from a bleeding standpoint, at that time patient can stop aspirin.  I will route this recommendation to the requesting party via Epic fax function and remove from pre-op pool.  Please call with questions.  Rip Harbour, NP 09/23/2023, 12:44 PM

## 2023-09-24 NOTE — Telephone Encounter (Signed)
FYI- Dr Meridee Score    Patient has been informed of below. Patient voiced understanding.   Plavix can be held for 5 days prior to procedure.  Patient will need to start aspirin 81 mg once daily while he is not taking Plavix.  Aspirin 81 mg daily should be continued through the perioperative period. Please resume Plavix when felt safe to do so from a bleeding standpoint, at that time patient can stop aspirin.

## 2023-09-25 NOTE — Telephone Encounter (Signed)
Got it. Thanks. GM 

## 2023-10-02 NOTE — Telephone Encounter (Signed)
 As of 10/02/23, patient still has not picked up Linzess samples. Therefore, these samples have been returned to our medication sample cabinet.

## 2023-10-05 ENCOUNTER — Other Ambulatory Visit: Payer: Self-pay

## 2023-10-05 ENCOUNTER — Other Ambulatory Visit (HOSPITAL_COMMUNITY): Payer: Self-pay

## 2023-10-05 ENCOUNTER — Encounter: Payer: Self-pay | Admitting: Pharmacist

## 2023-10-05 ENCOUNTER — Encounter: Payer: Self-pay | Admitting: Internal Medicine

## 2023-10-05 MED ORDER — ERGOCALCIFEROL 1.25 MG (50000 UT) PO CAPS
50000.0000 [IU] | ORAL_CAPSULE | ORAL | 0 refills | Status: DC
  Filled 2023-10-05: qty 8, 56d supply, fill #0

## 2023-10-06 ENCOUNTER — Encounter: Payer: Self-pay | Admitting: Neurology

## 2023-10-06 ENCOUNTER — Other Ambulatory Visit: Payer: Self-pay | Admitting: Neurology

## 2023-10-06 DIAGNOSIS — G250 Essential tremor: Secondary | ICD-10-CM

## 2023-10-12 ENCOUNTER — Encounter: Payer: Self-pay | Admitting: Neurology

## 2023-10-13 ENCOUNTER — Ambulatory Visit
Admission: RE | Admit: 2023-10-13 | Discharge: 2023-10-13 | Disposition: A | Discharge status: Home / Self Care | Payer: Managed Care, Other (non HMO) | Source: Ambulatory Visit | Attending: Neurology | Admitting: Neurology

## 2023-10-13 DIAGNOSIS — G250 Essential tremor: Secondary | ICD-10-CM

## 2023-10-16 ENCOUNTER — Encounter (HOSPITAL_COMMUNITY): Payer: Self-pay | Admitting: Gastroenterology

## 2023-10-16 NOTE — Progress Notes (Signed)
 Attempted to obtain medical history for pre op call via telephone, unable to reach at this time. HIPAA compliant voicemail message left requesting return call to pre surgical testing department.

## 2023-10-19 ENCOUNTER — Other Ambulatory Visit (HOSPITAL_COMMUNITY): Payer: Self-pay

## 2023-10-19 MED ORDER — METHIMAZOLE 5 MG PO TABS
ORAL_TABLET | ORAL | 6 refills | Status: DC
  Filled 2023-10-19: qty 30, 30d supply, fill #0
  Filled 2023-10-30: qty 30, 30d supply, fill #1

## 2023-10-21 ENCOUNTER — Encounter (HOSPITAL_COMMUNITY): Payer: Self-pay | Admitting: Gastroenterology

## 2023-10-21 NOTE — Anesthesia Preprocedure Evaluation (Signed)
 Anesthesia Evaluation  Patient identified by MRN, date of birth, ID band Patient awake    Reviewed: Allergy & Precautions, NPO status , Patient's Chart, lab work & pertinent test results  Airway Mallampati: III  TM Distance: >3 FB     Dental  (+) Teeth Intact, Dental Advisory Given   Pulmonary sleep apnea and Continuous Positive Airway Pressure Ventilation , former smoker   Pulmonary exam normal breath sounds clear to auscultation       Cardiovascular hypertension, Pt. on medications + CAD and + Past MI  Normal cardiovascular exam+ dysrhythmias Supra Ventricular Tachycardia  Rhythm:Regular Rate:Normal  '22 CATH: LM nl, LAD 80p (3.0x18 Onyx Frontier DES), D1 90 (too small for PCI), LCX nl, RCA nl. '22 ECHO: EF 50-55%, apical HK, mod asymm basal-septal hypertrophy. Nl RV fxn.  Hx/o NSTEMI post op lap gastric banding     Neuro/Psych negative neurological ROS     GI/Hepatic Neg liver ROS,GERD  Medicated,,Hx/o Roux en Y gastric bypass Hx/o Lap gastric band Hx/o Esophageal stricture Chronic Constipation  Abd pain  Gastroparesis   Endo/Other  diabetes, Well Controlled, Type 2, Insulin Dependent, Oral Hypoglycemic Agents Hyperthyroidism Hyperlipidemia  Renal/GU Renal diseaseHx/o renal calculi  negative genitourinary   Musculoskeletal  (+) Arthritis , Osteoarthritis,    Abdominal   Peds  Hematology  (+) Blood dyscrasia, anemia Plavix therapy   Anesthesia Other Findings   Reproductive/Obstetrics                              Anesthesia Physical Anesthesia Plan  ASA: 3  Anesthesia Plan: MAC   Post-op Pain Management: Minimal or no pain anticipated   Induction: Intravenous  PONV Risk Score and Plan: 2 and Treatment may vary due to age or medical condition and Propofol infusion  Airway Management Planned: Natural Airway and Nasal Cannula  Additional Equipment: None  Intra-op Plan:    Post-operative Plan:   Informed Consent: I have reviewed the patients History and Physical, chart, labs and discussed the procedure including the risks, benefits and alternatives for the proposed anesthesia with the patient or authorized representative who has indicated his/her understanding and acceptance.     Dental advisory given  Plan Discussed with: CRNA and Anesthesiologist  Anesthesia Plan Comments:          Anesthesia Quick Evaluation

## 2023-10-22 ENCOUNTER — Ambulatory Visit (HOSPITAL_COMMUNITY)
Admission: RE | Admit: 2023-10-22 | Discharge: 2023-10-22 | Disposition: A | Discharge status: Home / Self Care | Payer: Managed Care, Other (non HMO) | Attending: Gastroenterology | Admitting: Gastroenterology

## 2023-10-22 ENCOUNTER — Ambulatory Visit (HOSPITAL_COMMUNITY): Payer: Self-pay | Admitting: Anesthesiology

## 2023-10-22 ENCOUNTER — Encounter (HOSPITAL_COMMUNITY)
Admission: RE | Disposition: A | Discharge status: Home / Self Care | Payer: Self-pay | Source: Home / Self Care | Attending: Gastroenterology

## 2023-10-22 ENCOUNTER — Other Ambulatory Visit: Payer: Self-pay

## 2023-10-22 ENCOUNTER — Encounter (HOSPITAL_COMMUNITY): Payer: Self-pay | Admitting: Gastroenterology

## 2023-10-22 ENCOUNTER — Ambulatory Visit (HOSPITAL_BASED_OUTPATIENT_CLINIC_OR_DEPARTMENT_OTHER): Payer: Self-pay | Admitting: Anesthesiology

## 2023-10-22 ENCOUNTER — Telehealth: Payer: Self-pay

## 2023-10-22 DIAGNOSIS — K641 Second degree hemorrhoids: Secondary | ICD-10-CM | POA: Insufficient documentation

## 2023-10-22 DIAGNOSIS — K648 Other hemorrhoids: Secondary | ICD-10-CM

## 2023-10-22 DIAGNOSIS — Z955 Presence of coronary angioplasty implant and graft: Secondary | ICD-10-CM | POA: Insufficient documentation

## 2023-10-22 DIAGNOSIS — K3184 Gastroparesis: Secondary | ICD-10-CM | POA: Insufficient documentation

## 2023-10-22 DIAGNOSIS — W44F1XA Bezoar entering into or through a natural orifice, initial encounter: Secondary | ICD-10-CM | POA: Diagnosis not present

## 2023-10-22 DIAGNOSIS — Z980 Intestinal bypass and anastomosis status: Secondary | ICD-10-CM | POA: Insufficient documentation

## 2023-10-22 DIAGNOSIS — K209 Esophagitis, unspecified without bleeding: Secondary | ICD-10-CM

## 2023-10-22 DIAGNOSIS — E1143 Type 2 diabetes mellitus with diabetic autonomic (poly)neuropathy: Secondary | ICD-10-CM | POA: Diagnosis present

## 2023-10-22 DIAGNOSIS — K2100 Gastro-esophageal reflux disease with esophagitis, without bleeding: Secondary | ICD-10-CM | POA: Insufficient documentation

## 2023-10-22 DIAGNOSIS — Z87891 Personal history of nicotine dependence: Secondary | ICD-10-CM | POA: Diagnosis not present

## 2023-10-22 DIAGNOSIS — K644 Residual hemorrhoidal skin tags: Secondary | ICD-10-CM

## 2023-10-22 DIAGNOSIS — K5909 Other constipation: Secondary | ICD-10-CM | POA: Insufficient documentation

## 2023-10-22 DIAGNOSIS — M1990 Unspecified osteoarthritis, unspecified site: Secondary | ICD-10-CM | POA: Insufficient documentation

## 2023-10-22 DIAGNOSIS — T182XXA Foreign body in stomach, initial encounter: Secondary | ICD-10-CM | POA: Insufficient documentation

## 2023-10-22 DIAGNOSIS — Z1211 Encounter for screening for malignant neoplasm of colon: Secondary | ICD-10-CM | POA: Diagnosis not present

## 2023-10-22 DIAGNOSIS — D509 Iron deficiency anemia, unspecified: Secondary | ICD-10-CM | POA: Insufficient documentation

## 2023-10-22 DIAGNOSIS — I2510 Atherosclerotic heart disease of native coronary artery without angina pectoris: Secondary | ICD-10-CM | POA: Insufficient documentation

## 2023-10-22 DIAGNOSIS — R1084 Generalized abdominal pain: Secondary | ICD-10-CM

## 2023-10-22 DIAGNOSIS — G4733 Obstructive sleep apnea (adult) (pediatric): Secondary | ICD-10-CM | POA: Insufficient documentation

## 2023-10-22 DIAGNOSIS — Z8249 Family history of ischemic heart disease and other diseases of the circulatory system: Secondary | ICD-10-CM | POA: Insufficient documentation

## 2023-10-22 DIAGNOSIS — I471 Supraventricular tachycardia, unspecified: Secondary | ICD-10-CM | POA: Diagnosis not present

## 2023-10-22 DIAGNOSIS — Z79899 Other long term (current) drug therapy: Secondary | ICD-10-CM | POA: Diagnosis not present

## 2023-10-22 DIAGNOSIS — I10 Essential (primary) hypertension: Secondary | ICD-10-CM | POA: Insufficient documentation

## 2023-10-22 DIAGNOSIS — Z833 Family history of diabetes mellitus: Secondary | ICD-10-CM | POA: Insufficient documentation

## 2023-10-22 DIAGNOSIS — Z9884 Bariatric surgery status: Secondary | ICD-10-CM | POA: Diagnosis not present

## 2023-10-22 DIAGNOSIS — I252 Old myocardial infarction: Secondary | ICD-10-CM | POA: Diagnosis not present

## 2023-10-22 HISTORY — PX: BOTOX INJECTION: SHX5754

## 2023-10-22 HISTORY — PX: ESOPHAGOGASTRODUODENOSCOPY (EGD) WITH PROPOFOL: SHX5813

## 2023-10-22 HISTORY — PX: COLONOSCOPY WITH PROPOFOL: SHX5780

## 2023-10-22 LAB — GLUCOSE, CAPILLARY: Glucose-Capillary: 137 mg/dL — ABNORMAL HIGH (ref 70–99)

## 2023-10-22 SURGERY — ESOPHAGOGASTRODUODENOSCOPY (EGD) WITH PROPOFOL
Anesthesia: Monitor Anesthesia Care

## 2023-10-22 MED ORDER — SODIUM CHLORIDE (PF) 0.9 % IJ SOLN
INTRAMUSCULAR | Status: AC
  Filled 2023-10-22: qty 10

## 2023-10-22 MED ORDER — SODIUM CHLORIDE 0.9 % IV SOLN: INTRAVENOUS | Status: DC

## 2023-10-22 MED ORDER — PROPOFOL 500 MG/50ML IV EMUL
INTRAVENOUS | Status: DC | PRN
  Administered 2023-10-22: 80 ug/kg/min via INTRAVENOUS
  Administered 2023-10-22: 40 mg via INTRAVENOUS

## 2023-10-22 MED ORDER — ONABOTULINUMTOXINA 100 UNITS IJ SOLR
INTRAMUSCULAR | Status: AC
  Filled 2023-10-22: qty 100

## 2023-10-22 MED ORDER — SODIUM CHLORIDE (PF) 0.9 % IJ SOLN
INTRAMUSCULAR | Status: DC | PRN
  Administered 2023-10-22: 4 mL via SUBMUCOSAL

## 2023-10-22 SURGICAL SUPPLY — 23 items
BLOCK BITE 60FR ADLT L/F BLUE (MISCELLANEOUS) ×1 IMPLANT
ELECT REM PT RETURN 9FT ADLT (ELECTROSURGICAL) IMPLANT
ELECTRODE REM PT RTRN 9FT ADLT (ELECTROSURGICAL) IMPLANT
FLOOR PAD 36X40 (MISCELLANEOUS) ×1 IMPLANT
FORCEP RJ3 GP 1.8X160 W-NEEDLE (CUTTING FORCEPS) IMPLANT
FORCEPS BIOP RAD 4 LRG CAP 4 (CUTTING FORCEPS) IMPLANT
FORCEPS BIOP RJ4 240 W/NDL (CUTTING FORCEPS) IMPLANT
FORCEPS BXJMBJMB 240X2.8X (CUTTING FORCEPS) IMPLANT
INJECTOR/SNARE I SNARE (MISCELLANEOUS) IMPLANT
LUBRICANT JELLY 4.5OZ STERILE (MISCELLANEOUS) IMPLANT
MANIFOLD NEPTUNE II (INSTRUMENTS) IMPLANT
NDL SCLEROTHERAPY 25GX240 (NEEDLE) IMPLANT
NEEDLE SCLEROTHERAPY 25GX240 (NEEDLE) IMPLANT
PAD FLOOR 36X40 (MISCELLANEOUS) ×1 IMPLANT
PROBE APC STR FIRE (PROBE) IMPLANT
PROBE INJECTION GOLD 7FR (MISCELLANEOUS) IMPLANT
SNARE ROTATE MED OVAL 20MM (MISCELLANEOUS) IMPLANT
SNARE SHORT THROW 13M SML OVAL (MISCELLANEOUS) IMPLANT
SYR 50ML LL SCALE MARK (SYRINGE) IMPLANT
TRAP SPECIMEN MUCOUS 40CC (MISCELLANEOUS) IMPLANT
TUBING ENDO SMARTCAP PENTAX (MISCELLANEOUS) ×2 IMPLANT
TUBING IRRIGATION ENDOGATOR (MISCELLANEOUS) ×1 IMPLANT
WATER STERILE IRR 1000ML POUR (IV SOLUTION) IMPLANT

## 2023-10-22 NOTE — Discharge Instructions (Signed)

## 2023-10-22 NOTE — Op Note (Signed)
 [facility] Patient Name: Hector Curry Procedure Date: 10/22/2023 MRN: [id number] Attending MD: Corliss Parish , MD, [id number] Date of Birth: 01/01/1961 CSN: [id number] Age: 63 Admit Type: Outpatient Procedure:                Colonoscopy Indications:              Screening for colorectal malignant neoplasm,                            Incidental - Iron deficiency anemia Providers:                Corliss Parish, MD, Lorenza Evangelist, RN, Harrington Challenger, Technician Referring MD:              Medicines:                Monitored Anesthesia Care Complications:            No immediate complications. Estimated Blood Loss:     Estimated blood loss: none. Procedure:                Pre-Anesthesia Assessment:                           - Prior to the procedure, a History and Physical                            was performed, and patient medications and                            allergies were reviewed. The patient's tolerance of                            previous anesthesia was also reviewed. The risks                            and benefits of the procedure and the sedation                            options and risks were discussed with the patient.                            All questions were answered, and informed consent                            was obtained. Prior Anticoagulants: The patient has                            taken Plavix (clopidogrel), last dose was 5 days                            prior to procedure. ASA Grade Assessment: III - A  patient with severe systemic disease. After                            reviewing the risks and benefits, the patient was                            deemed in satisfactory condition to undergo the                            procedure.                           After obtaining informed consent, the colonoscope                            was passed under direct vision.  Throughout the                            procedure, the patient's blood pressure, pulse, and                            oxygen saturations were monitored continuously. The                            CF-HQ190L ([id number]) Olympus colonoscope was                            introduced through the anus and advanced to the the                            cecum, identified by appendiceal orifice and                            ileocecal valve. The colonoscopy was performed                            without difficulty. The patient tolerated the                            procedure. The quality of the bowel preparation was                            poor. The ileocecal valve, appendiceal orifice, and                            rectum were photographed. Scope In: 11:00:56 AM Scope Out: 11:14:10 AM Scope Withdrawal Time: 0 hours 5 minutes 45 seconds  Total Procedure Duration: 0 hours 13 minutes 14 seconds  Findings:      The digital rectal exam findings include hemorrhoids. Pertinent       negatives include no palpable rectal lesions.      Copious quantities of semi-solid stool was found in the entire colon,       interfering with visualization. Lavage of the area was performed using       copious amounts, resulting in incomplete  clearance with continued poor       visualization.      Non-bleeding non-thrombosed external and internal hemorrhoids were found       during retroflexion, during perianal exam and during digital exam. The       hemorrhoids were Grade II (internal hemorrhoids that prolapse but reduce       spontaneously). Impression:               - Preparation of the colon was poor.                           - Hemorrhoids found on digital rectal exam.                           - Stool in the entire examined colon.                           - Non-bleeding non-thrombosed external and internal                            hemorrhoids. Moderate Sedation:      Not Applicable - Patient had care  per Anesthesia. Recommendation:           - The patient will be observed post-procedure,                            until all discharge criteria are met.                           - Discharge patient to home.                           - Patient has a contact number available for                            emergencies. The signs and symptoms of potential                            delayed complications were discussed with the                            patient. Return to normal activities tomorrow.                            Written discharge instructions were provided to the                            patient.                           - Resume previous diet.                           - Patient will need a 2-day preparation for next                            colonoscopy scheduling procedure.  Hopefully patient                            will have some improvement with his gastroparesis                            symptoms with Botox performed during upper                            endoscopy today that may allow [place] an opportunity to                            clean him out more significantly at next available                            procedure date.                           - Continue present medications (Plavix restart date                            on EGD note).                           - The findings and recommendations were discussed                            with the patient.                           - The findings and recommendations were discussed                            with the patient's family. Procedure Code(s):        --- Professional ---                           Z6109, Colorectal cancer screening; colonoscopy on                            individual not meeting criteria for high risk Diagnosis Code(s):        --- Professional ---                           Z12.11, Encounter for screening for malignant                            neoplasm of colon                            K64.1, Second degree hemorrhoids CPT copyright 2022 American [employer] Association. All rights reserved. The codes documented in this report are preliminary and upon coder review may  be revised to meet current compliance requirements. Corliss Parish, MD 10/22/2023 11:26:52 AM Number of Addenda: 0

## 2023-10-22 NOTE — Op Note (Signed)
 [facility] Patient Name: Hector Curry Procedure Date: 10/22/2023 MRN: [id number] Attending MD: Corliss Parish , MD, [id number] Date of Birth: Feb 23, 1961 CSN: [id number] Age: 63 Admit Type: Outpatient Procedure:                Upper GI endoscopy Indications:              Gastroparesis, For therapy of gastroparesis Providers:                Corliss Parish, MD, Lorenza Evangelist, RN, Harrington Challenger, Technician Referring MD:              Medicines:                Monitored Anesthesia Care Complications:            No immediate complications. Estimated Blood Loss:     Estimated blood loss was minimal. Procedure:                Pre-Anesthesia Assessment:                           - Prior to the procedure, a History and Physical                            was performed, and patient medications and                            allergies were reviewed. The patient's tolerance of                            previous anesthesia was also reviewed. The risks                            and benefits of the procedure and the sedation                            options and risks were discussed with the patient.                            All questions were answered, and informed consent                            was obtained. Prior Anticoagulants: The patient has                            taken Plavix (clopidogrel), last dose was 5 days                            prior to procedure. ASA Grade Assessment: III - A                            patient with severe systemic disease. After  reviewing the risks and benefits, the patient was                            deemed in satisfactory condition to undergo the                            procedure.                           After obtaining informed consent, the endoscope was                            passed under direct vision. Throughout the                            procedure,  the patient's blood pressure, pulse, and                            oxygen saturations were monitored continuously. The                            [id number]-H190 ([id number] Olympus endoscope was introduced                            through the mouth, and advanced to the second part                            of duodenum. The upper GI endoscopy was                            accomplished without difficulty. The patient                            tolerated the procedure. Scope In: Scope Out: Findings:      No gross lesions were noted in the proximal esophagus and in the mid       esophagus.      LA Grade B (one or more mucosal breaks greater than 5 mm, not extending       between the tops of two mucosal folds) esophagitis with no bleeding was       found in the distal esophagus.      An esophago-gastric anastomosis was found in the distal esophagus (at 40       cm) disease.      A large phytobezoar was found in the entire examined stomach.      The pylorus was normal. Area was successfully injected with 100 units       botulinum toxin (in 4 quadrants).      No gross lesions were noted in the duodenal bulb, in the first portion       of the duodenum and in the second portion of the duodenum. Impression:               - No gross lesions in the proximal esophagus and in  the mid esophagus.                           - LA Grade B esophagitis with no bleeding found                            distally.                           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                           - Normal pylorus. Injected with botulinum toxin.                           - No gross lesions in the duodenal bulb, in the                            first portion of the duodenum and in the second                            portion of the duodenum. Moderate Sedation:      Not Applicable - Patient had care per  Anesthesia. Recommendation:           - Proceed to scheduled colonoscopy.                           - Observe patient's clinical course.                           - May restart Plavix on 3/1.                           - Continue present medications otherwise.                           - The findings and recommendations were discussed                            with the patient.                           - The findings and recommendations were discussed                            with the patient's family. Procedure Code(s):        --- Professional ---                           [phone], Esophagogastroduodenoscopy, flexible,                            transoral; with directed submucosal injection(s),  any substance Diagnosis Code(s):        --- Professional ---                           K20.90, Esophagitis, unspecified without bleeding                           Z98.890, Other specified postprocedural states                           T18.2XXA, Foreign body in stomach, initial encounter                           K31.84, Gastroparesis CPT copyright 2022 [employer]. All rights reserved. The codes documented in this report are preliminary and upon coder review may  be revised to meet current compliance requirements. Corliss Parish, MD 10/22/2023 11:23:17 AM Number of Addenda: 0

## 2023-10-22 NOTE — Anesthesia Postprocedure Evaluation (Signed)
 Anesthesia Post Note  Patient: DEMARKIS GHEEN  Procedure(s) Performed: ESOPHAGOGASTRODUODENOSCOPY (EGD) WITH PROPOFOL BOTOX INJECTION COLONOSCOPY WITH PROPOFOL     Patient location during evaluation: PACU Anesthesia Type: MAC Level of consciousness: awake and alert and oriented Pain management: pain level controlled Vital Signs Assessment: post-procedure vital signs reviewed and stable Respiratory status: spontaneous breathing, nonlabored ventilation and respiratory function stable Cardiovascular status: stable and blood pressure returned to baseline Postop Assessment: no apparent nausea or vomiting Anesthetic complications: no   No notable events documented.  Last Vitals:  Vitals:   10/22/23 0932 10/22/23 1125  BP: (!) 153/96 118/81  Pulse: 68 74  Resp: 10 20  Temp: 36.7 C 36.7 C  SpO2: 96% 96%    Last Pain:  Vitals:   10/22/23 1125  TempSrc: Tympanic  PainSc: Asleep                 Eleanor Gatliff A.

## 2023-10-22 NOTE — Telephone Encounter (Signed)
-----   Message from [facility] sent at 10/22/2023 11:27 AM EST ----- Regarding: Followup and Recall Becci Batty, Put this patient in for a clinic follow-up with me in 6 to 8 weeks (okay to use overbook slot if needed). Place colonoscopy recall for 6 months from now. Thanks. GM

## 2023-10-22 NOTE — Anesthesia Procedure Notes (Signed)
 Procedure Name: MAC Date/Time: 10/22/2023 10:35 AM  Performed by: Floydene Flock, CRNAPre-anesthesia Checklist: Patient identified, Emergency Drugs available, Suction available and Patient being monitored Patient Re-evaluated:Patient Re-evaluated prior to induction Oxygen Delivery Method: Simple face mask Preoxygenation: Pre-oxygenation with 100% oxygen Induction Type: IV induction Placement Confirmation: positive ETCO2

## 2023-10-22 NOTE — Telephone Encounter (Signed)
 Recall has been entered  Appt has been made for 12/18/23 at 930 am with GM Pt notified via My Chart

## 2023-10-22 NOTE — Transfer of Care (Signed)
 Immediate Anesthesia Transfer of Care Note  Patient: Hector Curry  Procedure(s) Performed: ESOPHAGOGASTRODUODENOSCOPY (EGD) WITH PROPOFOL BOTOX INJECTION COLONOSCOPY WITH PROPOFOL  Patient Location: PACU and Endoscopy Unit  Anesthesia Type:MAC  Level of Consciousness: drowsy  Airway & Oxygen Therapy: Patient Spontanous Breathing  Post-op Assessment: Report to RN, Patient on room air. Vitals within normal limits.  Post vital signs: Reviewed and stable  Last Vitals:  Vitals Value Taken Time  BP 118/81 10/22/23 1125  Temp 36.7 C 10/22/23 1125  Pulse 71 10/22/23 1126  Resp 12 10/22/23 1126  SpO2 96 % 10/22/23 1126  Vitals shown include unfiled device data.  Last Pain:  Vitals:   10/22/23 1125  TempSrc: Tympanic  PainSc: Asleep         Complications: No notable events documented.

## 2023-10-22 NOTE — H&P (Signed)
 GASTROENTEROLOGY PROCEDURE H&P NOTE   Primary Care Physician: Marguarite Arbour, MD  HPI: Hector Curry is a 63 y.o. male who presents for EGD/colonoscopy.  EGD for evaluation of gastroparesis and previous esophagogastric stricture and Botox.  Colonoscopy for iron deficiency anemia.  Past Medical History:  Diagnosis Date   Allergic rhinitis    Allergy    Atrial tachycardia (HCC)    CAD (coronary artery disease)    a. 05/08/2015 Cath: no significant CAD, LVEF nl-->Med; b. 07/2017 MV: attenuation artifact, no ischemia, EF 65%-->Low risk; c. 03/2021 NSTEMI/PCI: LM nl, LAD 80p (3.0x18 Onyx Frontier DES), D1 90 (too small for PCI), LCX nl, RCA nl.   Diabetes mellitus without complication (HCC)    Diastolic dysfunction    a. 04/2015 Echo: EF 60-65%; b. 05/2016 Echo: EF 60-65%, GrI DD; c. 07/2017 Echo: EF 55-60%, Ao root 42mm; d. 03/2019 Echo: EF 55-60%, Ao root/Asc Ao 42mm; e. 03/2021 Echo: EF 50-55%, apical HK, mod asymm basal-septal hypertrophy. Nl RV fxn. Asc Ao 44mm.   Dilated aortic root (HCC)    a. 07/2017 Echo: 42mm Ao root - mildly dil; b. 03/2019 Echo: Ao root 42mm; c. 03/2021 Echo: Asc Ao 44mm.   Diverticulosis 10/03/2014   Dysrhythmia    Esophageal stricture    a.  In setting of lap band June 2022-stricture at the GJ anastomosis requiring gastrostomy tube; b. 05/2021 s/p esoph stenting (15mm); c. 07/2021 s/p esoph stenting (20mm).   Family history of premature CAD    a. father passed from MI at 67   GERD (gastroesophageal reflux disease)    GIB (gastrointestinal bleeding)    a. 07/2021 following esoph stenting.   Hemorrhoids    a. internal hemorrhoids s/p surgery 1999   History of kidney stones    History of tobacco abuse    Hyperlipidemia    Hyperplastic colon polyp 10/03/2014   a. x 2    Hypertension    Inflammatory arthritis    a. CCP antibodies & x-rays negative. Rheumatoid factor 14, felt to be crystaline over RA or psoriatic   Iron deficiency anemia 02/05/2022    Morbid obesity (HCC)    a. s/p LAP-BAND - complicated by esoph stricture.   Myocardial infarction [facility])    NSTEMI   Nausea and vomiting 04/11/2023   OSA (obstructive sleep apnea)    a. on CPAP   Osteoarthritis    PSVT (paroxysmal supraventricular tachycardia) (HCC)    a. 48 hr Holter 04/2015: NSR w/ rare PVC, short runs of narrow complex tachycardiac, possible atrial tach, longest run 7 beats, PACs noted (2% of all beats 3600 total) they did not seem to correlate w/ significant arrythmia; b. 08/2017 Event monitor: no significant arrhythmias.   Sleep apnea    Past Surgical History:  Procedure Laterality Date   BALLOON DILATION N/A 08/14/2021   Procedure: BALLOON DILATION;  Surgeon: Mansouraty, Netty Starring., MD;  Location: [facility];  Service: Gastroenterology;  Laterality: N/A;   BALLOON DILATION N/A 02/20/2022   Procedure: BALLOON DILATION;  Surgeon: Rachael Fee, MD;  Location: [facility];  Service: Gastroenterology;  Laterality: N/A;   BALLOON DILATION N/A 03/27/2022   Procedure: BALLOON DILATION;  Surgeon: Meridee Score Netty Starring., MD;  Location: [facility] ENDOSCOPY;  Service: Gastroenterology;  Laterality: N/A;   BARIATRIC SURGERY  01/2021   lap band    BILIARY STENT PLACEMENT N/A 08/14/2021   Procedure: AXIOS STENT PLACEMENT;  Surgeon: Lemar Lofty., MD;  Location: [facility];  Service: Gastroenterology;  Laterality: N/A;   BILIARY STENT PLACEMENT N/A 10/16/2022   Procedure: STENT PLACEMENT;  Surgeon: Lemar Lofty., MD;  Location: [facility];  Service: Gastroenterology;  Laterality: N/A;   BILIARY STENT PLACEMENT N/A 04/09/2023   Procedure: AXIOS STENT PLACEMENT;  Surgeon: Lemar Lofty., MD;  Location: [facility] ENDOSCOPY;  Service: Gastroenterology;  Laterality: N/A;   BIOPSY  07/08/2021   Procedure: BIOPSY;  Surgeon: Meridee Score Netty Starring., MD;  Location: [facility]OSCOPY;  Service: Gastroenterology;;   BIOPSY  08/14/2021   Procedure: BIOPSY;  Surgeon:  Lemar Lofty., MD;  Location: [facility];  Service: Gastroenterology;;   BIOPSY  10/16/2022   Procedure: BIOPSY;  Surgeon: Lemar Lofty., MD;  Location: [facility]COPY;  Service: Gastroenterology;;   CARDIAC CATHETERIZATION N/A 05/08/2015   Procedure: Left Heart Cath and Coronary Angiography;  Surgeon: Corky Crafts, MD;  Location: [facility] INVASIVE CV LAB;  Service: Cardiovascular;  Laterality: N/A;   CARPAL TUNNEL RELEASE Bilateral    CHOLECYSTECTOMY     COLONOSCOPY  06/27/2005   COLONOSCOPY  10/03/2014   CORONARY STENT INTERVENTION N/A 04/15/2021   Procedure: CORONARY STENT INTERVENTION;  Surgeon: Runell Gess, MD;  Location: [facility] INVASIVE CV LAB;  Service: Cardiovascular;  Laterality: N/A;   DUODENAL STENT PLACEMENT N/A 11/18/2021   Procedure: DUODENAL STENT PLACEMENT;  Surgeon: Meridee Score Netty Starring., MD;  Location: [facility];  Service: Gastroenterology;  Laterality: N/A;  axios placed at GJ anastamosis   DUODENAL STENT PLACEMENT  03/27/2022   Procedure: GASTRIC STENT PLACEMENT;  Surgeon: Meridee Score Netty Starring., MD;  Location: [facility] ENDOSCOPY;  Service: Gastroenterology;;   ESOPHAGEAL DILATION  05/27/2021   Procedure: ESOPHAGEAL DILATION;  Surgeon: Lemar Lofty., MD;  Location: [facility];  Service: Gastroenterology;;   ESOPHAGEAL DILATION  01/23/2022   Procedure: ESOPHAGEAL DILATION;  Surgeon: Lemar Lofty., MD;  Location: [facility] ENDOSCOPY;  Service: Gastroenterology;;   ESOPHAGEAL DILATION  04/09/2023   Procedure: ESOPHAGEAL DILATION;  Surgeon: Lemar Lofty., MD;  Location: [facility]COPY;  Service: Gastroenterology;;   ESOPHAGEAL STENT PLACEMENT N/A 05/27/2021   Procedure: ESOPHAGEAL STENT PLACEMENT;  Surgeon: Lemar Lofty., MD;  [facility];  Service: Gastroenterology;  Laterality: N/A;   ESOPHAGOGASTRODUODENOSCOPY  06/27/2005   ESOPHAGOGASTRODUODENOSCOPY N/A 03/19/2021   Procedure: ESOPHAGOGASTRODUODENOSCOPY  (EGD);  Surgeon: Sheliah Hatch, De Blanch, MD;  Location: [facility]COPY;  Service: General;  Laterality: N/A;   ESOPHAGOGASTRODUODENOSCOPY N/A 08/16/2021   Procedure: ESOPHAGOGASTRODUODENOSCOPY (EGD);  Surgeon: Lemar Lofty., MD;  Location: [facility] ENDOSCOPY;  Service: Gastroenterology;  Laterality: N/A;   ESOPHAGOGASTRODUODENOSCOPY (EGD) WITH PROPOFOL N/A 05/27/2021   Procedure: ESOPHAGOGASTRODUODENOSCOPY (EGD) WITH PROPOFOL;  Surgeon: Meridee Score Netty Starring., MD;  [facility];  Service: Gastroenterology;  Laterality: N/A;   ESOPHAGOGASTRODUODENOSCOPY (EGD) WITH PROPOFOL N/A 07/08/2021   Procedure: ESOPHAGOGASTRODUODENOSCOPY (EGD) WITH PROPOFOL;  Surgeon: Meridee Score Netty Starring., MD;  Location: [facility];  Service: Gastroenterology;  Laterality: N/A;  fluoro   ESOPHAGOGASTRODUODENOSCOPY (EGD) WITH PROPOFOL N/A 08/14/2021   Procedure: ESOPHAGOGASTRODUODENOSCOPY (EGD) WITH PROPOFOL;  Surgeon: Meridee Score Netty Starring., MD;  Location: [facility];  Service: Gastroenterology;  Laterality: N/A;  fluoro Axios stent (20 mm)   ESOPHAGOGASTRODUODENOSCOPY (EGD) WITH PROPOFOL N/A 10/16/2021   Procedure: ESOPHAGOGASTRODUODENOSCOPY (EGD) WITH PROPOFOL;  Surgeon: Meridee Score Netty Starring., MD;  Location: [facility];  Service: Gastroenterology;  Laterality: N/A;  axios stent pull fluoro   ESOPHAGOGASTRODUODENOSCOPY (EGD) WITH PROPOFOL N/A 11/18/2021   Procedure: ESOPHAGOGASTRODUODENOSCOPY (EGD) WITH PROPOFOL;  Surgeon: Meridee Score Netty Starring., MD;  [facility]: [facility];  Service: Gastroenterology;  Laterality: N/A;  ESOPHAGOGASTRODUODENOSCOPY (EGD) WITH PROPOFOL N/A 01/23/2022   Procedure: ESOPHAGOGASTRODUODENOSCOPY (EGD) WITH PROPOFOL;  Surgeon: Meridee Score Netty Starring., MD;  Location: [facility] ENDOSCOPY;  Service: Gastroenterology;  Laterality: N/A;   ESOPHAGOGASTRODUODENOSCOPY (EGD) WITH PROPOFOL N/A 02/20/2022   Procedure: ESOPHAGOGASTRODUODENOSCOPY (EGD) WITH PROPOFOL;  Surgeon: Rachael Fee,  MD;  [facility];  Service: Gastroenterology;  Laterality: N/A;   ESOPHAGOGASTRODUODENOSCOPY (EGD) WITH PROPOFOL N/A 03/27/2022   Procedure: ESOPHAGOGASTRODUODENOSCOPY (EGD) WITH PROPOFOL;  Surgeon: Meridee Score Netty Starring., MD;  Location: [facility] ENDOSCOPY;  Service: Gastroenterology;  Laterality: N/A;   ESOPHAGOGASTRODUODENOSCOPY (EGD) WITH PROPOFOL N/A 08/14/2022   Procedure: ESOPHAGOGASTRODUODENOSCOPY (EGD) WITH PROPOFOL;  Surgeon: Meridee Score Netty Starring., MD;  [facility]: [facility];  Service: Gastroenterology;  Laterality: N/A;   ESOPHAGOGASTRODUODENOSCOPY (EGD) WITH PROPOFOL N/A 10/16/2022   Procedure: ESOPHAGOGASTRODUODENOSCOPY (EGD) WITH PROPOFOL;  Surgeon: Meridee Score Netty Starring., MD;  [facility];  Service: Gastroenterology;  Laterality: N/A;   ESOPHAGOGASTRODUODENOSCOPY (EGD) WITH PROPOFOL N/A 11/28/2022   Procedure: ESOPHAGOGASTRODUODENOSCOPY (EGD) WITH PROPOFOL WITH AXIOS STENT REMOVAL;  Surgeon: Meridee Score Netty Starring., MD;  Location: [facility] ENDOSCOPY;  Service: Gastroenterology;  Laterality: N/A;   ESOPHAGOGASTRODUODENOSCOPY (EGD) WITH PROPOFOL N/A 04/09/2023   Procedure: ESOPHAGOGASTRODUODENOSCOPY (EGD) WITH PROPOFOL;  Surgeon: Meridee Score Netty Starring., MD;  [facility];  Service: Gastroenterology;  Laterality: N/A;  dilation - axios stent   ESOPHAGOGASTRODUODENOSCOPY (EGD) WITH PROPOFOL N/A 04/11/2023   Procedure: ESOPHAGOGASTRODUODENOSCOPY (EGD) WITH PROPOFOL;  Surgeon: Beverley Fiedler, MD;  Location: [facility];  Service: Gastroenterology;  Laterality: N/A;   ESOPHAGOGASTRODUODENOSCOPY (EGD) WITH PROPOFOL N/A 04/30/2023   Procedure: ESOPHAGOGASTRODUODENOSCOPY (EGD) WITH PROPOFOL;  Surgeon: Meridee Score Netty Starring., MD;  Location: [facility];  Service: Gastroenterology;  Laterality: N/A;  axios stent pull   FLEXIBLE SIGMOIDOSCOPY N/A 08/16/2021   Procedure: FLEXIBLE SIGMOIDOSCOPY;  Surgeon: Meridee Score Netty Starring., MD;  Location: [facility] ENDOSCOPY;  Service:  Gastroenterology;  Laterality: N/A;   FOREIGN BODY REMOVAL  02/20/2022   Procedure: FOREIGN BODY REMOVAL;  Surgeon: Rachael Fee, MD;  Location: [facility];  Service: Gastroenterology;;   FOREIGN BODY REMOVAL ESOPHAGEAL  03/27/2022   Procedure: REMOVAL FOREIGN BODY;  Surgeon: Meridee Score Netty Starring., MD;  Location: [facility] ENDOSCOPY;  Service: Gastroenterology;;   GASTRIC ROUX-EN-Y N/A 02/18/2021   Procedure: LAPAROSCOPIC ROUX-EN-Y GASTRIC BYPASS WITH UPPER ENDOSCOPY,;  Surgeon: Sheliah Hatch De Blanch, MD;  Location: [facility];  Service: General;  Laterality: N/A;   GASTRIC ROUX-EN-Y N/A 12/02/2022   Procedure: LAPAROSCOPIC converted to OPEN GASTRIC BYPASS REVERSAL, TAKEDOWN OF GASTROJEJUNAL ANASTOMOSIS, GASTRO GASTRIC ANASTOMOSIS WITH UPPER ENDOSCOPY 1. Laparoscopic lysis of adhesions 2. Partial gastrectomy 3. Small bowel resection 4. Creation of esophagus to gastric anastomosis 5. Creation of gastrostomy;  Surgeon: Kinsinger, De Blanch, MD;  Location: [facility];  Service: General;  Laterality:    GASTROINTESTINAL STENT REMOVAL  01/23/2022   Procedure: Christy Sartorius REMOVAL;  Surgeon: Lemar Lofty., MD;  Location: [facility] ENDOSCOPY;  Service: Gastroenterology;;   GASTROINTESTINAL STENT REMOVAL N/A 04/30/2023   Procedure: GASTROINTESTINAL STENT REMOVAL;  Surgeon: Lemar Lofty., MD;  Location: [facility];  Service: Gastroenterology;  Laterality: N/A;   GASTROSTOMY N/A 12/02/2022   Procedure: INSERTION OF GASTROSTOMY TUBE;  Surgeon: Sheliah Hatch De Blanch, MD;  Location: [facility];  Service: General;  Laterality: N/A;   HEMORRHOID SURGERY  08/25/1997   IR GJ TUBE CHANGE  03/27/2021   LAPAROSCOPIC INSERTION GASTROSTOMY TUBE N/A 03/20/2021   Procedure: LAPAROSCOPIC INSERTION GASTROSTOMY TUBE;  Surgeon: Rodman Pickle, MD;  Location: [facility];  Service: General;  Laterality: N/A;   LEFT  HEART CATH AND CORONARY ANGIOGRAPHY N/A 04/15/2021   Procedure: LEFT HEART CATH AND CORONARY ANGIOGRAPHY;   Surgeon: Runell Gess, MD;  Location: [facility] INVASIVE CV LAB;  Service: Cardiovascular;  Laterality: N/A;   MOUTH SURGERY     removed area which was benign   SCLEROTHERAPY  04/30/2023   Procedure: SCLEROTHERAPY;  Surgeon: Mansouraty, Netty Starring., MD;  Location: [facility] ENDOSCOPY;  Service: Gastroenterology;;   Francine Graven REMOVAL  07/08/2021   Procedure: STENT REMOVAL;  Surgeon: Lemar Lofty., MD;  Location: [facility] ENDOSCOPY;  Service: Gastroenterology;;   Francine Graven REMOVAL  10/16/2021   Procedure: STENT REMOVAL;  Surgeon: Lemar Lofty., MD;  Location: [facility]OSCOPY;  Service: Gastroenterology;;   Francine Graven REMOVAL  08/14/2022   Procedure: STENT REMOVAL;  Surgeon: Lemar Lofty., MD;  Location: [facility]OSCOPY;  Service: Gastroenterology;;   Francine Graven REMOVAL  11/28/2022   Procedure: STENT REMOVAL;  Surgeon: Lemar Lofty., MD;  Location: [facility] ENDOSCOPY;  Service: Gastroenterology;;   Francine Graven REMOVAL  04/11/2023   Procedure: STENT REMOVAL;  Surgeon: Beverley Fiedler, MD;  Location: [facility];  Service: Gastroenterology;;   STERIOD INJECTION  01/23/2022   Procedure: STEROID INJECTION;  Surgeon: Lemar Lofty., MD;  Location: [facility] ENDOSCOPY;  Service: Gastroenterology;;   SUBMUCOSAL INJECTION  11/18/2021   Procedure: SUBMUCOSAL INJECTION;  Surgeon: Lemar Lofty., MD;  Location: [facility]OSCOPY;  Service: Gastroenterology;;   SUBMUCOSAL INJECTION  08/14/2022   Procedure: SUBMUCOSAL INJECTION;  Surgeon: Lemar Lofty., MD;  Location: [facility] ENDOSCOPY;  Service: Gastroenterology;;   TONSILLECTOMY     UPPER GI ENDOSCOPY N/A 03/20/2021   Procedure: UPPER GI ENDOSCOPY;  Surgeon: Kinsinger, De Blanch, MD;  Location: [facility];  Service: General;  Laterality: N/A;   No current facility-administered medications for this encounter.   No current facility-administered medications for this encounter. Allergies  Allergen Reactions   Other Itching and Other (See Comments)     Cats Wheezing, runny nose/eyes, congestion    Family History  Problem Relation Age of Onset   Hypertension Mother    Diabetes Mother    Heart attack Father 39   CAD Father    Hypertension Sister    Diabetes Other    Hypertension Other    Heart disease Other    Colon cancer Neg Hx    Esophageal cancer Neg Hx    Pancreatic cancer Neg Hx    Stomach cancer Neg Hx    Liver disease Neg Hx    Inflammatory bowel disease Neg Hx    Rectal cancer Neg Hx    Social History   Socioeconomic History   Marital status: Married    Spouse name: Not on file   Number of children: 3   Years of education: Not on file   Highest education level: Not on file  Occupational History   Occupation: IT IT trainer  Tobacco Use   Smoking status: Former    Current packs/day: 0.00    Average packs/day: 1 pack/day for 15.0 years (15.0 ttl pk-yrs)    Types: Cigarettes    Start date: 08/05/1974    Quit date: 08/05/1989    Years since quitting: 34.2   Smokeless tobacco: Never  Vaping Use   Vaping status: Never Used  Substance and Sexual Activity   Alcohol use: Not Currently    Comment: occ   Drug use: Never   Sexual activity: Not Currently  Other Topics Concern   Not on file  Social History Narrative   Not on file  Social Drivers of Corporate investment banker Strain: Low Risk  (09/30/2023)   Received [facility]   Overall Financial Resource Strain (CARDIA)    Difficulty of Paying Living Expenses: Not hard at all  Food Insecurity: No Food Insecurity (09/30/2023)   Received from [facility]   Hunger Vital Sign    Worried About Running Out of Food in the Last Year: Never true    Ran Out of Food in the Last Year: Never true  Transportation Needs: No Transportation Needs (09/30/2023)   Received from [facility] - Transportation    In the past 12 months, has lack of transportation kept you from medical appointments or from getting  medications?: No    Lack of Transportation (Non-Medical): No  Physical Activity: Not on file  Stress: Not on file  Social Connections: Unknown (04/14/2023)   [facility]   Social Network    Social Network: Not on file  Intimate Partner Violence: Unknown (04/14/2023)   Received from [facility]   HITS    Physically Hurt: Not on file    Insult or Talk Down To: Not on file    Threaten Physical Harm: Not on file    Scream or Curse: Not on file    Physical Exam: There were no vitals filed for this visit. There is no height or weight on file to calculate BMI. GEN: NAD EYE: Sclerae anicteric ENT: MMM CV: Non-tachycardic GI: Soft, NT/ND NEURO:  Alert & Oriented x 3  Lab Results: No results for input(s): "WBC", "HGB", "HCT", "PLT" in the last 72 hours. BMET No results for input(s): "NA", "K", "CL", "CO2", "GLUCOSE", "BUN", "CREATININE", "CALCIUM" in the last 72 hours. LFT No results for input(s): "PROT", "ALBUMIN", "AST", "ALT", "ALKPHOS", "BILITOT", "BILIDIR", "IBILI" in the last 72 hours. PT/INR No results for input(s): "LABPROT", "INR" in the last 72 hours.   Impression / Plan: This is a 63 y.o.male who presents for EGD/colonoscopy.  EGD for evaluation of gastroparesis and previous esophagogastric stricture and Botox.  Colonoscopy for iron deficiency anemia.  The risks and benefits of endoscopic evaluation/treatment were discussed with the patient and/or family; these include but are not limited to the risk of perforation, infection, bleeding, missed lesions, lack of diagnosis, severe illness requiring hospitalization, as well as anesthesia and sedation related illnesses.  The patient's history has been reviewed, patient examined, no change in status, and deemed stable for procedure.  The patient and/or family is agreeable to proceed.    Corliss Parish, MD [facility] Advanced [facility]oscopy Office # [phone]

## 2023-10-25 ENCOUNTER — Encounter (HOSPITAL_COMMUNITY): Payer: Self-pay | Admitting: Gastroenterology

## 2023-10-30 ENCOUNTER — Other Ambulatory Visit (HOSPITAL_COMMUNITY): Payer: Self-pay

## 2023-10-31 ENCOUNTER — Other Ambulatory Visit (HOSPITAL_COMMUNITY): Payer: Self-pay

## 2023-11-09 ENCOUNTER — Inpatient Hospital Stay: Payer: Managed Care, Other (non HMO) | Attending: Hematology

## 2023-11-09 ENCOUNTER — Other Ambulatory Visit (HOSPITAL_COMMUNITY): Payer: Self-pay

## 2023-11-09 DIAGNOSIS — D509 Iron deficiency anemia, unspecified: Secondary | ICD-10-CM | POA: Diagnosis present

## 2023-11-09 DIAGNOSIS — D508 Other iron deficiency anemias: Secondary | ICD-10-CM

## 2023-11-09 DIAGNOSIS — K9089 Other intestinal malabsorption: Secondary | ICD-10-CM

## 2023-11-09 LAB — CBC WITH DIFFERENTIAL/PLATELET
Abs Immature Granulocytes: 0.09 10*3/uL — ABNORMAL HIGH (ref 0.00–0.07)
Basophils Absolute: 0 10*3/uL (ref 0.0–0.1)
Basophils Relative: 0 %
Eosinophils Absolute: 0.1 10*3/uL (ref 0.0–0.5)
Eosinophils Relative: 1 %
HCT: 48.8 % (ref 39.0–52.0)
Hemoglobin: 16 g/dL (ref 13.0–17.0)
Immature Granulocytes: 1 %
Lymphocytes Relative: 12 %
Lymphs Abs: 1.3 10*3/uL (ref 0.7–4.0)
MCH: 31.2 pg (ref 26.0–34.0)
MCHC: 32.8 g/dL (ref 30.0–36.0)
MCV: 95.1 fL (ref 80.0–100.0)
Monocytes Absolute: 0.9 10*3/uL (ref 0.1–1.0)
Monocytes Relative: 8 %
Neutro Abs: 8.2 10*3/uL — ABNORMAL HIGH (ref 1.7–7.7)
Neutrophils Relative %: 78 %
Platelets: 169 10*3/uL (ref 150–400)
RBC: 5.13 MIL/uL (ref 4.22–5.81)
RDW: 13.9 % (ref 11.5–15.5)
WBC: 10.5 10*3/uL (ref 4.0–10.5)
nRBC: 0 % (ref 0.0–0.2)

## 2023-11-09 LAB — IRON AND TIBC
Iron: 63 ug/dL (ref 45–182)
Saturation Ratios: 19 % (ref 17.9–39.5)
TIBC: 337 ug/dL (ref 250–450)
UIBC: 274 ug/dL

## 2023-11-09 LAB — FERRITIN: Ferritin: 104 ng/mL (ref 24–336)

## 2023-11-09 LAB — FOLATE: Folate: 10.1 ng/mL (ref >5.9)

## 2023-11-09 LAB — VITAMIN B12: Vitamin B-12: 1685 pg/mL — ABNORMAL HIGH (ref 180–914)

## 2023-11-12 LAB — METHYLMALONIC ACID, SERUM: Methylmalonic Acid, Quantitative: 102 nmol/L (ref 0–378)

## 2023-11-16 NOTE — Progress Notes (Unsigned)
 [facility] [address], [place] [address]   CLINIC:  Medical Oncology/Hematology  PCP:  Marguarite Arbour, MD [address] [facility] [place] [address] [phone]   REASON FOR VISIT:  Follow-up for iron deficiency anemia   PRIOR THERAPY: Oral iron tablets   CURRENT THERAPY: Intermittent IV iron   INTERVAL HISTORY:   Hector Curry 63 y.o. male returns for routine follow-up of  iron deficiency anemia.  He was last seen by Rojelio Brenner PA-C on 05/18/2023.  His most recent IV iron was Venofer 400 mg on 09/04/2023.  At today's visit, he reports feeling fairly well. He had noted increased fatigue, lightheadedness, headaches, and pica prior to IV iron in January, which improved after iron infusion.  He reports significantly improved energy, but notes that he is still working to get his appetite and taste back after his multiple GI procedures.  He denies any bright red blood per rectum or melena.   No pica, restless legs, headaches, chest pain, dyspnea on exertion, lightheadedness, or syncope.  He has chronic palpitations, on propranolol.   He has 100% energy and 50% appetite. He endorses that he is maintaining a stable weight.   ASSESSMENT & PLAN:  1.   Iron deficiency anemia - Seen  at the request of his primary care provider (Dr. Aram Beecham) for evaluation and treatment of iron deficiency anemia with IV iron infusions requested. 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  During hospitalization, Hgb dropped to 7.8, transfused 1 unit PRBC and IV iron (Ferrlecit 250 mg) - S/p partial gastrectomy and esophagectomy with EG anastomosis, G-tube, and small bowel resection in April  2024 - Most recent EGD (04/30/2023): Esophagogastric anastomosis stricture.  4 cm hiatal hernia.  No bleeding noted. - Hematology work-up (02/05/2022): Normal B12, folate, copper, methylmalonic acid, homocystine. Normal LDH, reticulocytes, SPEP. - He is taking daily iron tablet.   Also taking Plavix, Protonix, and Carafate. - Most recent IV iron with Venofer 400 mg on 09/04/2023. - Most recent labs (11/09/2023): Hgb 16.0, ferritin 104, iron saturation 19%.  Normal folate, B12, MMA. - Patient denies any recent hematemesis, hematochezia, or melena. 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 - PLAN: No IV iron needed at this time.    - Repeat CBC/iron panel and RTC in 6 months - Patient can continue to take oral iron supplement ONCE daily dosing, at least 2 hours separate from his PPI and Carafate   2.  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  He lives at home with his wife.  He has a remote history of tobacco use, smoked 1 PPD cigarettes for 9 years, but quit in 1991.  He denies any alcohol or illicit drug use. -- FAMILY:  Maternal grandfather with pancreatic cancer.  Patient's sister also has iron deficiency anemia secondary to history of gastric bypass surgery  PLAN SUMMARY: >> Labs in 6 months = CBC/D, ferritin, iron/TIBC >> OFFICE visit in 6 months (1 week after labs)     REVIEW OF SYSTEMS:   Review of Systems  Constitutional:  Positive for appetite change. Negative for chills, diaphoresis, fatigue, fever and unexpected weight change.  HENT:    Negative for lump/mass and nosebleeds.   Eyes:  Negative for eye problems.  Respiratory:  Negative for cough, hemoptysis and shortness of breath.   Cardiovascular:  Negative for chest pain, leg swelling and palpitations.  Gastrointestinal:  Negative for abdominal pain, blood in stool, constipation, diarrhea, nausea and vomiting.  Genitourinary:  Negative for hematuria.   Skin: Negative.   Neurological:  Negative for dizziness, headaches and light-headedness.  Hematological:  Does not bruise/bleed easily.     PHYSICAL EXAM:  ECOG PERFORMANCE STATUS: 0 - Asymptomatic  Vitals:   11/17/23 0803  BP: 121/84  Pulse: 68  Temp: 97.6 F (36.4 C)  SpO2: 98%    Physical Exam Constitutional:      Appearance: Normal appearance. He is normal weight.  Cardiovascular:     Heart sounds: Normal heart sounds.  Pulmonary:     Breath sounds: Normal breath sounds.  Neurological:     General: No focal deficit present.     Mental Status: Mental status is at baseline.  Psychiatric:        Behavior: Behavior normal. Behavior is cooperative.    PAST MEDICAL/SURGICAL HISTORY:  Past Medical History:  Diagnosis Date   Allergic rhinitis    Allergy    Atrial tachycardia (HCC)    CAD (coronary artery disease)    a. 05/08/2015 Cath: no significant CAD, LVEF nl-->Med; b. 07/2017 MV: attenuation artifact, no ischemia, EF 65%-->Low risk; c. 03/2021 NSTEMI/PCI: LM nl, LAD 80p (3.0x18 Onyx Frontier DES), D1 90 (too small for PCI), LCX nl, RCA nl.   Diabetes mellitus without complication (HCC)    Diastolic dysfunction    a. 04/2015 Echo: EF 60-65%; b. 05/2016 Echo: EF 60-65%, GrI DD; c. 07/2017 Echo: EF 55-60%, Ao root 42mm; d. 03/2019 Echo: EF 55-60%, Ao root/Asc Ao 42mm; e. 03/2021 Echo: EF 50-55%, apical HK, mod asymm basal-septal hypertrophy. Nl RV fxn. Asc Ao 44mm.   Dilated aortic root (HCC)    a. 07/2017 Echo: 42mm Ao root - mildly dil; b. 03/2019 Echo: Ao root 42mm; c. 03/2021 Echo: Asc Ao 44mm.    Diverticulosis 10/03/2014   Dysrhythmia    Esophageal stricture    a.  In setting of lap band June 2022-stricture at the GJ anastomosis requiring gastrostomy tube; b. 05/2021 s/p esoph stenting (15mm); c. 07/2021 s/p esoph stenting (20mm).   Family history of premature CAD    a. father passed from MI at 50   GERD (gastroesophageal reflux disease)    GIB (gastrointestinal bleeding)    a. 07/2021 following esoph stenting.   Hemorrhoids    a. internal hemorrhoids s/p surgery 1999   History of kidney stones    History of tobacco abuse    Hyperlipidemia    Hyperplastic colon polyp 10/03/2014   a. x 2    Hypertension    Inflammatory arthritis    a. CCP antibodies & x-rays negative. Rheumatoid factor 14, felt to be crystaline over RA or psoriatic   Iron deficiency anemia 02/05/2022   Morbid obesity (HCC)    a. s/p LAP-BAND - complicated by esoph stricture.   Myocardial infarction [facility])    NSTEMI   Nausea and vomiting 04/11/2023  OSA (obstructive sleep apnea)    a. on CPAP   Osteoarthritis    PSVT (paroxysmal supraventricular tachycardia) (HCC)    a. 48 hr Holter 04/2015: NSR w/ rare PVC, short runs of narrow complex tachycardiac, possible atrial tach, longest run 7 beats, PACs noted (2% of all beats 3600 total) they did not seem to correlate w/ significant arrythmia; b. 08/2017 Event monitor: no significant arrhythmias.   Sleep apnea    Past Surgical History:  Procedure Laterality Date   BALLOON DILATION N/A 08/14/2021   Procedure: BALLOON DILATION;  Surgeon: Mansouraty, Netty Starring., MD;  Location: [facility] ENDOSCOPY;  Service: Gastroenterology;  Laterality: N/A;   BALLOON DILATION N/A 02/20/2022   Procedure: BALLOON DILATION;  Surgeon: Rachael Fee, MD;  Location: [facility] ENDOSCOPY;  Service: Gastroenterology;  Laterality: N/A;   BALLOON DILATION N/A 03/27/2022   Procedure: BALLOON DILATION;  Surgeon: Meridee Score Netty Starring., MD;  Location: [facility] ENDOSCOPY;  Service: Gastroenterology;   Laterality: N/A;   BARIATRIC SURGERY  01/2021   lap band    BILIARY STENT PLACEMENT N/A 08/14/2021   Procedure: AXIOS STENT PLACEMENT;  Surgeon: Lemar Lofty., MD;  [facility]: [facility];  Service: Gastroenterology;  Laterality: N/A;   BILIARY STENT PLACEMENT N/A 10/16/2022   Procedure: STENT PLACEMENT;  Surgeon: Lemar Lofty., MD;  Location: [facility] ENDOSCOPY;  Service: Gastroenterology;  Laterality: N/A;   BILIARY STENT PLACEMENT N/A 04/09/2023   Procedure: AXIOS STENT PLACEMENT;  Surgeon: Lemar Lofty., MD;  Location: [facility] ENDOSCOPY;  Service: Gastroenterology;  Laterality: N/A;   BIOPSY  07/08/2021   Procedure: BIOPSY;  Surgeon: Meridee Score Netty Starring., MD;  Location: [facility] ENDOSCOPY;  Service: Gastroenterology;;   BIOPSY  08/14/2021   Procedure: BIOPSY;  Surgeon: Lemar Lofty., MD;  Location: [facility] ENDOSCOPY;  Service: Gastroenterology;;   BIOPSY  10/16/2022   Procedure: BIOPSY;  Surgeon: Lemar Lofty., MD;  Location: [facility] ENDOSCOPY;  Service: Gastroenterology;;   BOTOX INJECTION N/A 10/22/2023   Procedure: BOTOX INJECTION;  Surgeon: Lemar Lofty., MD;  [facility];  Service: Gastroenterology;  Laterality: N/A;   CARDIAC CATHETERIZATION N/A 05/08/2015   Procedure: Left Heart Cath and Coronary Angiography;  Surgeon: Corky Crafts, MD;  Location: [facility] INVASIVE CV LAB;  Service: Cardiovascular;  Laterality: N/A;   CARPAL TUNNEL RELEASE Bilateral    CHOLECYSTECTOMY     COLONOSCOPY  06/27/2005   COLONOSCOPY  10/03/2014   COLONOSCOPY WITH PROPOFOL N/A 10/22/2023   Procedure: COLONOSCOPY WITH PROPOFOL;  Surgeon: Meridee Score Netty Starring., MD;  Location: [facility] ENDOSCOPY;  Service: Gastroenterology;  Laterality: N/A;   CORONARY STENT INTERVENTION N/A 04/15/2021   Procedure: CORONARY STENT INTERVENTION;  Surgeon: Runell Gess, MD;  Location: [facility] INVASIVE CV LAB;  Service: Cardiovascular;  Laterality: N/A;   DUODENAL STENT PLACEMENT N/A  11/18/2021   Procedure: DUODENAL STENT PLACEMENT;  Surgeon: Meridee Score Netty Starring., MD;  [facility]: [facility];  Service: Gastroenterology;  Laterality: N/A;  axios placed at GJ anastamosis   DUODENAL STENT PLACEMENT  03/27/2022   Procedure: GASTRIC STENT PLACEMENT;  Surgeon: Meridee Score Netty Starring., MD;  Location: [facility] ENDOSCOPY;  Service: Gastroenterology;;   ESOPHAGEAL DILATION  05/27/2021   Procedure: ESOPHAGEAL DILATION;  Surgeon: Lemar Lofty., MD;  Location: [facility] ENDOSCOPY;  Service: Gastroenterology;;   ESOPHAGEAL DILATION  01/23/2022   Procedure: ESOPHAGEAL DILATION;  Surgeon: Lemar Lofty., MD;  Location: [facility] ENDOSCOPY;  Service: Gastroenterology;;   ESOPHAGEAL DILATION  04/09/2023   Procedure: ESOPHAGEAL DILATION;  Surgeon: Lemar Lofty., MD;  Location:  [facility];  Service: Gastroenterology;;   ESOPHAGEAL STENT PLACEMENT N/A 05/27/2021   Procedure: ESOPHAGEAL STENT PLACEMENT;  Surgeon: Meridee Score Netty Starring., MD;  Location: [facility] ENDOSCOPY;  Service: Gastroenterology;  Laterality: N/A;   ESOPHAGOGASTRODUODENOSCOPY  06/27/2005   ESOPHAGOGASTRODUODENOSCOPY N/A 03/19/2021   Procedure: ESOPHAGOGASTRODUODENOSCOPY (EGD);  Surgeon: Sheliah Hatch, De Blanch, MD;  Location: [facility] ENDOSCOPY;  Service: General;  Laterality: N/A;   ESOPHAGOGASTRODUODENOSCOPY N/A 08/16/2021   Procedure: ESOPHAGOGASTRODUODENOSCOPY (EGD);  Surgeon: Lemar Lofty., MD;  Location: [facility] ENDOSCOPY;  Service: Gastroenterology;  Laterality: N/A;   ESOPHAGOGASTRODUODENOSCOPY (EGD) WITH PROPOFOL N/A 05/27/2021   Procedure: ESOPHAGOGASTRODUODENOSCOPY (EGD) WITH PROPOFOL;  Surgeon: Meridee Score Netty Starring., MD;  Location[facility];  Service: Gastroenterology;  Laterality: N/A;   ESOPHAGOGASTRODUODENOSCOPY (EGD) WITH PROPOFOL N/A 07/08/2021   Procedure: ESOPHAGOGASTRODUODENOSCOPY (EGD) WITH PROPOFOL;  Surgeon: Meridee Score Netty Starring., MD;  [facility];  Service: Gastroenterology;   Laterality: N/A;  fluoro   ESOPHAGOGASTRODUODENOSCOPY (EGD) WITH PROPOFOL N/A 08/14/2021   Procedure: ESOPHAGOGASTRODUODENOSCOPY (EGD) WITH PROPOFOL;  Surgeon: Meridee Score Netty Starring., MD;  Location: [facility];  Service: Gastroenterology;  Laterality: N/A;  fluoro Axios stent (20 mm)   ESOPHAGOGASTRODUODENOSCOPY (EGD) WITH PROPOFOL N/A 10/16/2021   Procedure: ESOPHAGOGASTRODUODENOSCOPY (EGD) WITH PROPOFOL;  Surgeon: Meridee Score Netty Starring., MD;  [facility];  Service: Gastroenterology;  Laterality: N/A;  axios stent pull fluoro   ESOPHAGOGASTRODUODENOSCOPY (EGD) WITH PROPOFOL N/A 11/18/2021   Procedure: ESOPHAGOGASTRODUODENOSCOPY (EGD) WITH PROPOFOL;  Surgeon: Meridee Score Netty Starring., MD;  [facility]: [facility];  Service: Gastroenterology;  Laterality: N/A;   ESOPHAGOGASTRODUODENOSCOPY (EGD) WITH PROPOFOL N/A 01/23/2022   Procedure: ESOPHAGOGASTRODUODENOSCOPY (EGD) WITH PROPOFOL;  Surgeon: Meridee Score Netty Starring., MD;  Location[facility] ENDOSCOPY;  Service: Gastroenterology;  Laterality: N/A;   ESOPHAGOGASTRODUODENOSCOPY (EGD) WITH PROPOFOL N/A 02/20/2022   Procedure: ESOPHAGOGASTRODUODENOSCOPY (EGD) WITH PROPOFOL;  Surgeon: Rachael Fee, MD;  [facility];  Service: Gastroenterology;  Laterality: N/A;   ESOPHAGOGASTRODUODENOSCOPY (EGD) WITH PROPOFOL N/A 03/27/2022   Procedure: ESOPHAGOGASTRODUODENOSCOPY (EGD) WITH PROPOFOL;  Surgeon: Meridee Score Netty Starring., MD;  Location: [facility] ENDOSCOPY;  Service: Gastroenterology;  Laterality: N/A;   ESOPHAGOGASTRODUODENOSCOPY (EGD) WITH PROPOFOL N/A 08/14/2022   Procedure: ESOPHAGOGASTRODUODENOSCOPY (EGD) WITH PROPOFOL;  Surgeon: Meridee Score Netty Starring., MD;  [facility];  Service: Gastroenterology;  Laterality: N/A;   ESOPHAGOGASTRODUODENOSCOPY (EGD) WITH PROPOFOL N/A 10/16/2022   Procedure: ESOPHAGOGASTRODUODENOSCOPY (EGD) WITH PROPOFOL;  Surgeon: Meridee Score Netty Starring., MD;  [facility];  Service: Gastroenterology;   Laterality: N/A;   ESOPHAGOGASTRODUODENOSCOPY (EGD) WITH PROPOFOL N/A 11/28/2022   Procedure: ESOPHAGOGASTRODUODENOSCOPY (EGD) WITH PROPOFOL WITH AXIOS STENT REMOVAL;  Surgeon: Meridee Score Netty Starring., MD;  Location: [facility] ENDOSCOPY;  Service: Gastroenterology;  Laterality: N/A;   ESOPHAGOGASTRODUODENOSCOPY (EGD) WITH PROPOFOL N/A 04/09/2023   Procedure: ESOPHAGOGASTRODUODENOSCOPY (EGD) WITH PROPOFOL;  Surgeon: Meridee Score Netty Starring., MD;  [facility];  Service: Gastroenterology;  Laterality: N/A;  dilation - axios stent   ESOPHAGOGASTRODUODENOSCOPY (EGD) WITH PROPOFOL N/A 04/11/2023   Procedure: ESOPHAGOGASTRODUODENOSCOPY (EGD) WITH PROPOFOL;  Surgeon: Beverley Fiedler, MD;  [facility]: [facility];  Service: Gastroenterology;  Laterality: N/A;   ESOPHAGOGASTRODUODENOSCOPY (EGD) WITH PROPOFOL N/A 04/30/2023   Procedure: ESOPHAGOGASTRODUODENOSCOPY (EGD) WITH PROPOFOL;  Surgeon: Meridee Score Netty Starring., MD;  Location: [facility];  Service: Gastroenterology;  Laterality: N/A;  axios stent pull   ESOPHAGOGASTRODUODENOSCOPY (EGD) WITH PROPOFOL N/A 10/22/2023   Procedure: ESOPHAGOGASTRODUODENOSCOPY (EGD) WITH PROPOFOL;  Surgeon: Meridee Score Netty Starring., MD;  [facility]: [facility];  Service: Gastroenterology;  Laterality: N/A;   FLEXIBLE SIGMOIDOSCOPY N/A 08/16/2021   Procedure: FLEXIBLE SIGMOIDOSCOPY;  Surgeon: Lemar Lofty.,  MD;  Location: [facility] ENDOSCOPY;  Service: Gastroenterology;  Laterality: N/A;   FOREIGN BODY REMOVAL  02/20/2022   Procedure: FOREIGN BODY REMOVAL;  Surgeon: Rachael Fee, MD;  Location: [facility] ENDOS[facility];  Service: Gastroenterology;;   FOREIGN BODY REMOVAL ESOPHAGEAL  03/27/2022   Procedure: REMOVAL FOREIGN BODY;  Surgeon: Meridee Score Netty Starring., MD;  Location: [facility] ENDOSCOPY;  Service: Gastroenterology;;   GASTRIC ROUX-EN-Y N/A 02/18/2021   Procedure: LAPAROSCOPIC ROUX-EN-Y GASTRIC BYPASS WITH UPPER ENDOSCOPY,;  Surgeon: Sheliah Hatch De Blanch, MD;  Location: [facility] ORS;   Service: General;  Laterality: N/A;   GASTRIC ROUX-EN-Y N/A 12/02/2022   Procedure: LAPAROSCOPIC converted to OPEN GASTRIC BYPASS REVERSAL, TAKEDOWN OF GASTROJEJUNAL ANASTOMOSIS, GASTRO GASTRIC ANASTOMOSIS WITH UPPER ENDOSCOPY 1. Laparoscopic lysis of adhesions 2. Partial gastrectomy 3. Small bowel resection 4. Creation of esophagus to gastric anastomosis 5. Creation of gastrostomy;  Surgeon: Kinsinger, De Blanch, MD;  Location: [facility] ORS;  Service: General;  Laterality:    GASTROINTESTINAL STENT REMOVAL  01/23/2022   Procedure: Christy Sartorius REMOVAL;  Surgeon: Lemar Lofty., MD;  Location: [facility] ENDOSCOPY;  Service: Gastroenterology;;   GASTROINTESTINAL STENT REMOVAL N/A 04/30/2023   Procedure: GASTROINTESTINAL STENT REMOVAL;  Surgeon: Lemar Lofty., MD;  [facility]: [facility];  Service: Gastroenterology;  Laterality: N/A;   GASTROSTOMY N/A 12/02/2022   Procedure: INSERTION OF GASTROSTOMY TUBE;  Surgeon: Sheliah Hatch De Blanch, MD;  Location: [facility];  Service: General;  Laterality: N/A;   HEMORRHOID SURGERY  08/25/1997   IR GJ TUBE CHANGE  03/27/2021   LAPAROSCOPIC INSERTION GASTROSTOMY TUBE N/A 03/20/2021   Procedure: LAPAROSCOPIC INSERTION GASTROSTOMY TUBE;  Surgeon: Rodman Pickle, MD;  Location: [facility];  Service: General;  Laterality: N/A;   LEFT HEART CATH AND CORONARY ANGIOGRAPHY N/A 04/15/2021   Procedure: LEFT HEART CATH AND CORONARY ANGIOGRAPHY;  Surgeon: Runell Gess, MD;  Location: [facility] INVASIVE CV LAB;  Service: Cardiovascular;  Laterality: N/A;   MOUTH SURGERY     removed area which was benign   SCLEROTHERAPY  04/30/2023   Procedure: SCLEROTHERAPY;  Surgeon: Mansouraty, Netty Starring., MD;  Location: [facility] ENDOSCOPY;  Service: Gastroenterology;;   Francine Graven REMOVAL  07/08/2021   Procedure: STENT REMOVAL;  Surgeon: Lemar Lofty., MD;  Location: [facility] ENDOSCOPY;  Service: Gastroenterology;;   Francine Graven REMOVAL  10/16/2021   Procedure: STENT REMOVAL;  Surgeon: Lemar Lofty., MD;  Location: [facility] ENDOSCOPY;  Service: Gastroenterology;;   Francine Graven REMOVAL  08/14/2022   Procedure: STENT REMOVAL;  Surgeon: Lemar Lofty., MD;  Location: [facility] ENDOSCOPY;  Service: Gastroenterology;;   Francine Graven REMOVAL  11/28/2022   Procedure: STENT REMOVAL;  Surgeon: Lemar Lofty., MD;  Location: [facility] ENDOSCOPY;  Service: Gastroenterology;;   Francine Graven REMOVAL  04/11/2023   Procedure: STENT REMOVAL;  Surgeon: Beverley Fiedler, MD;  [facility]: [facility];  Service: Gastroenterology;;   STERIOD INJECTION  01/23/2022   Procedure: STEROID INJECTION;  Surgeon: Lemar Lofty., MD;  Location: [facility] ENDOSCOPY;  Service: Gastroenterology;;   SUBMUCOSAL INJECTION  11/18/2021   Procedure: SUBMUCOSAL INJECTION;  Surgeon: Lemar Lofty., MD;  Location: [facility] ENDOSCOPY;  Service: Gastroenterology;;   SUBMUCOSAL INJECTION  08/14/2022   Procedure: SUBMUCOSAL INJECTION;  Surgeon: Lemar Lofty., MD;  Location: [facility] ENDOSCOPY;  Service: Gastroenterology;;   TONSILLECTOMY     UPPER GI ENDOSCOPY N/A 03/20/2021   Procedure: UPPER GI ENDOSCOPY;  Surgeon: Kinsinger, De Blanch, MD;  Location: [facility];  Service: General;  Laterality: N/A;    SOCIAL HISTORY:  Social History   Socioeconomic  History   Marital status: Married    Spouse name: Not on file   Number of children: 3   Years of education: Not on file   Highest education level: Not on file  Occupational History   Occupation: IT IT trainer  Tobacco Use   Smoking status: Former    Current packs/day: 0.00    Average packs/day: 1 pack/day for 15.0 years (15.0 ttl pk-yrs)    Types: Cigarettes    Start date: 08/05/1974    Quit date: 08/05/1989    Years since quitting: 34.3   Smokeless tobacco: Never  Vaping Use   Vaping status: Never Used  Substance and Sexual Activity   Alcohol use: Not Currently    Comment: occ   Drug use: Never   Sexual activity: Not Currently  Other Topics Concern   Not on file  Social  History Narrative   Not on file   Social Drivers of Health   Financial Resource Strain: Low Risk  (09/30/2023)   Received from [facility]   Overall Financial Resource Strain (CARDIA)    Difficulty of Paying Living Expenses: Not hard at all  Food Insecurity: No Food Insecurity (09/30/2023)   Received from [facility]   Hunger Vital Sign    Worried About Running Out of Food in the Last Year: Never true    Ran Out of Food in the Last Year: Never true  Transportation Needs: No Transportation Needs (09/30/2023)   Received [facility] - Transportation    In the past 12 months, has lack of transportation kept you from medical appointments or from getting medications?: No    Lack of Transportation (Non-Medical): No  Physical Activity: Not on file  Stress: Not on file  Social Connections: Unknown (04/14/2023)   Received from [facility]   Social Network    Social Network: Not on file  Intimate Partner Violence: Unknown (04/14/2023)   Received from [facility]   HITS    Physically Hurt: Not on file    Insult or Talk Down To: Not on file    Threaten Physical Harm: Not on file    Scream or Curse: Not on file    FAMILY HISTORY:  Family History  Problem Relation Age of Onset   Hypertension Mother    Diabetes Mother    Heart attack Father 54   CAD Father    Hypertension Sister    Diabetes Other    Hypertension Other    Heart disease Other    Colon cancer Neg Hx    Esophageal cancer Neg Hx    Pancreatic cancer Neg Hx    Stomach cancer Neg Hx    Liver disease Neg Hx    Inflammatory bowel disease Neg Hx    Rectal cancer Neg Hx     CURRENT MEDICATIONS:  Outpatient Encounter Medications as of 11/17/2023  Medication Sig Note   cetirizine (ZYRTEC) 10 MG tablet Take by mouth.    clopidogrel (PLAVIX) 75 MG tablet Take 1 tablet (75 mg total) by mouth daily.    Continuous Glucose Sensor (FREESTYLE LIBRE 14 DAY SENSOR)  MISC Insert 1 sensor to the back of the arm as directed every 14 days for continuous glucose monitoring    ergocalciferol (VITAMIN D2) 1.25 MG (50000 UT) capsule Take 1 capsule (50,000 Units total) by mouth once a week.    fluticasone (FLONASE) 50 MCG/ACT nasal spray Place 2-3 sprays into both nostrils daily  as needed for allergies or rhinitis.    insulin lispro (HUMALOG) 100 UNIT/ML KwikPen Inject subcutaneously 3 (three) times daily with meals Per sliding scale (Patient taking differently: Inject 3-7 Units into the skin daily as needed (For high blood sugar).)    Insulin Pen Needle (UNIFINE PENTIPS) 32G X 4 MM MISC Use as needed as directed    methimazole (TAPAZOLE) 5 MG tablet Take 1 tablet (5 mg total) by mouth once daily    metoCLOPramide (REGLAN) 5 MG tablet Take 1 tablet (5 mg total) by mouth at bedtime. (Patient not taking: Reported on 09/02/2023)    mometasone (ELOCON) 0.1 % lotion Apply to affected areas once daily (Patient taking differently: Apply 1 Application topically as needed (psoriasis).)    Multiple Vitamin (MULTI-VITAMIN DAILY PO) Take 1 patch by mouth daily. (Patient not taking: Reported on 09/02/2023)    Na Sulfate-K Sulfate-Mg Sulf (SUPREP BOWEL PREP KIT) 17.5-3.13-1.6 GM/177ML SOLN Take 1 kit by mouth as directed. For colonoscopy prep    nitroGLYCERIN (NITROSTAT) 0.4 MG SL tablet Place 1 tablet (0.4 mg total) under the tongue every 5 (five) minutes as needed for chest pain. (Patient not taking: Reported on 09/02/2023)    ondansetron (ZOFRAN-ODT) 4 MG disintegrating tablet Take 1 tablet (4 mg total) by mouth every 6 (six) hours as needed for nausea or vomiting. (Patient not taking: Reported on 09/02/2023)    OVER THE COUNTER MEDICATION Apply 1 Application topically daily at 6 (six) AM. Medication: Bariatric patch (Patient not taking: Reported on 09/02/2023) 04/11/2023: Patient verified he applies every day    prochlorperazine (COMPAZINE) 25 MG suppository Place 1 suppository (25 mg total)  rectally every 12 (twelve) hours as needed for nausea or vomiting. (Patient not taking: Reported on 09/02/2023)    prochlorperazine (COMPAZINE) 5 MG tablet Take 1 tablet (5 mg total) by mouth every 6 (six) hours as needed for nausea or vomiting. (Patient not taking: Reported on 09/02/2023)    propranolol (INDERAL) 20 MG tablet Take 1 tablet (20 mg total) by mouth 3 (three) times daily. (Patient taking differently: Take 20 mg by mouth at bedtime.)    propranolol 40 MG/5ML solution Take 5 mg by mouth every evening. As needed 04/11/2023: Patient verified he is taking the liquid form    Propylene Glycol (SYSTANE BALANCE OP) Place 2-3 drops into both eyes daily as needed (dry eyes).    Pseudoeph-Doxylamine-DM-APAP (NYQUIL PO) Take 2 tablets by mouth at bedtime as needed (pain).    RABEprazole (ACIPHEX) 20 MG tablet Take 1 tablet (20 mg total) by mouth in the morning and at bedtime.    senna (SENOKOT) 8.6 MG tablet Take 1 tablet by mouth 2 (two) times daily. (Patient not taking: Reported on 09/02/2023)    VITAMIN E PO Take 1 tablet by mouth daily.    [DISCONTINUED] sucralfate (CARAFATE) 1 g tablet Take 1 tablet (1 g total) by mouth 4 (four) times daily before meals and nightly (Patient not taking: Reported on 05/19/2023)    No facility-administered encounter medications on file as of 11/17/2023.    ALLERGIES:  Allergies  Allergen Reactions   Other Itching and Other (See Comments)    Cats Wheezing, runny nose/eyes, congestion     LABORATORY DATA:  I have reviewed the labs as listed.  CBC    Component Value Date/Time   WBC 10.5 11/09/2023 0756   RBC 5.13 11/09/2023 0756   HGB 16.0 11/09/2023 0756   HCT 48.8 11/09/2023 0756   PLT 169 11/09/2023 0756  MCV 95.1 11/09/2023 0756   MCH 31.2 11/09/2023 0756   MCHC 32.8 11/09/2023 0756   RDW 13.9 11/09/2023 0756   LYMPHSABS 1.3 11/09/2023 0756   MONOABS 0.9 11/09/2023 0756   EOSABS 0.1 11/09/2023 0756   BASOSABS 0.0 11/09/2023 0756      Latest Ref  Rng & Units 06/29/2023    8:01 AM 06/04/2023    9:05 PM 04/12/2023    4:45 AM  CMP  Glucose 70 - 99 mg/dL 284  132  440   BUN 6 - 20 mg/dL 7  9  9    Creatinine 0.61 - 1.24 mg/dL 1.02  7.25  3.66   Sodium 135 - 145 mmol/L 136  135  134   Potassium 3.5 - 5.1 mmol/L 3.3  3.3  3.6   Chloride 98 - 111 mmol/L 100  98  98   CO2 22 - 32 mmol/L 29  27  27    Calcium 8.9 - 10.3 mg/dL 8.8  8.6  8.5   Total Protein 6.5 - 8.1 g/dL 6.4     Total Bilirubin <1.2 mg/dL 0.7     Alkaline Phos 38 - 126 U/L 64     AST 15 - 41 U/L 19     ALT 0 - 44 U/L 36       DIAGNOSTIC IMAGING:  I have independently reviewed the relevant imaging and discussed with the patient.   WRAP UP:  All questions were answered. The patient knows to call the clinic with any problems, questions or concerns.  Medical decision making: Low  Time spent on visit: I spent 15 minutes counseling the patient face to face. The total time spent in the appointment was 22 minutes and more than 50% was on counseling.  Carnella Guadalajara, PA-C  11/17/23 8:27 AM

## 2023-11-17 ENCOUNTER — Inpatient Hospital Stay: Payer: Managed Care, Other (non HMO) | Admitting: Physician Assistant

## 2023-11-17 VITALS — BP 121/84 | HR 68 | Temp 97.6°F

## 2023-11-17 DIAGNOSIS — K9089 Other intestinal malabsorption: Secondary | ICD-10-CM

## 2023-11-17 DIAGNOSIS — D508 Other iron deficiency anemias: Secondary | ICD-10-CM | POA: Diagnosis not present

## 2023-11-17 DIAGNOSIS — D509 Iron deficiency anemia, unspecified: Secondary | ICD-10-CM | POA: Diagnosis not present

## 2023-11-25 ENCOUNTER — Other Ambulatory Visit (HOSPITAL_COMMUNITY): Payer: Self-pay

## 2023-12-03 NOTE — Progress Notes (Signed)
 DIVISION OF PULMONARY AND CRITICAL CARE MEDICINE                              FOLLOW UP ENCOUNTER     Chief complaint: Symptomatic sleep apnea with excessive daytime sleepiness and primary snorring  History of Present Illness Hector Curry is a 63 year old male with sleep apnea who presents for evaluation of his sleep apnea management.  He has been managing sleep apnea for over 15 years, initially using BiPAP and currently using a CPAP machine, specifically the AirSense 11. CPAP therapy is essential for his sleep, but his current device is malfunctioning and past its life expectancy. He has not been contacted for a replacement device.  A previous sleep study may have been skewed due to his bed being elevated to prevent reflux, affecting the accuracy of the test results. He experiences throat collapse when lying flat, which was not adequately captured in the test. He reports an RDI of 8 and symptomatic sleep apnea.  No symptoms of restless leg syndrome, insomnia, depression, sleepwalking, asthma, or atrial fibrillation. He reports seasonal allergies managed with Zyrtec  and has a history of acid reflux. He denies regular alcohol consumption and drinks coffee only occasionally. He works over 40 hours a week and does not take regular naps.   Past Medical History:   Past Medical History:  Diagnosis Date  . Acute myocardial infarction of anterolateral wall, initial episode of care (CMS/HHS-HCC)   . Allergic rhinitis   . Arrhythmia   . Diverticulosis 10/03/2014   sigmoid colon and distal descending colon  . GERD (gastroesophageal reflux disease)   . Hemorrhoids 10/03/2014  . History of blood transfusion   . History of kidney stones   . Hypercholesterolemia   . Hyperplastic colon polyp 10/03/2014   x 2  . Hypertension   . Sleep apnea    for which he uses a CPAP  . Sleep apnea     Past Surgical History:   Past Surgical History:  Procedure  Laterality Date  . Hemorrhoid surgery  1999  . EGD  06/27/2005   No Repeat  . gastic bypass  02/18/2021  . heart stent  2023  . EGD N/A 08/26/2022   Procedure: ESOPHAGOGASTRODUODENOSCOPY, FLEXIBLE, TRANSORAL; DIAGNOSTIC, INCLUDING COLLECTION OF SPECIMEN(S) BY BRUSHING OR WASHING, WHEN PERFORMED (SEPARATE PROCEDURE);  Surgeon: Alvan Gwendlyn Bars, MD;  Location: [facility];  Service: Gastroenterology;  Laterality: N/A;  . LAPAROSCOPIC converted to OPEN GASTRIC BYPASS REVERSAL, TAKEDOWN OF GASTROJEJUNAL ANASTOMOSIS, GASTRO GASTRIC ANASTOMOSIS WITH UPPER ENDOSCOPY  12/02/2022   Dr. Stevie  . COLONOSCOPY  10/03/2014, 06/27/2005   Dr. EMERSON Mariner @ [facility] - Hyperplastic Polyp, 10 yr rpt per [facility]  . ENDOSCOPIC CARPAL TUNNEL RELEASE Left   . TONSILLECTOMY      Allergies:   Allergies  Allergen Reactions  . Other Other (See Comments)    Cats    Current Medications:   Prior to Admission medications   Medication Sig Taking? Last Dose  artificial tears (GONIOSCOP-HPM) 2.5 % ophthalmic solution Apply to eye. Yes Taking  cetirizine  (ZYRTEC ) 10 MG tablet Take 10 mg by mouth once daily Yes Taking  clopidogreL  (PLAVIX ) 75 mg tablet TAKE 15 MLS (3TSP) BY G TUBE ONCE DAILY Yes Taking  flash  glucose sensor (FREESTYLE LIBRE 14 DAY SENSOR) kit Insert 1 sensor to the back of the arm as directed every 14 days for continuous glucose monitoring Yes Taking  fluticasone  propionate (FLONASE  NASAL) by Nasal route Yes Taking  insulin  LISPRO (HUMALOG  KWIKPEN) pen injector (concentration 100 units/mL) Inject subcutaneously 3 (three) times daily with meals Per sliding scale Yes Taking  propranoloL  (INDERAL ) 20 MG tablet Take 1 tablet (20 mg total) by mouth 3 (three) times daily Patient taking differently: Take 20 mg by mouth 3 (three) times daily Takes nightly Yes Taking  RABEprazole  (ACIPHEX ) 20 mg EC tablet Take 20 mg by mouth 2 (two) times daily Yes Taking  UNABLE TO FIND Med Name: CPAP machine  and supplies Yes Taking  UNIFINE PENTIPS 32 gauge x 5/32 Ndle Use as needed as directed Yes Taking  ergocalciferol , vitamin D2, 1,250 mcg (50,000 unit) capsule Take 1 capsule (50,000 Units total) by mouth once a week for 8 doses    methIMAzole  (TAPAZOLE ) 5 MG tablet Take 1 tablet (5 mg total) by mouth once daily Patient not taking: Reported on 12/03/2023  Not Taking    Family History:   Family History  Problem Relation Name Age of Onset  . Skin cancer Mother    . Obesity Mother    . Hyperlipidemia (Elevated cholesterol) Mother    . High blood pressure (Hypertension) Mother    . Diabetes type II Mother    . High blood pressure (Hypertension) Father    . Hyperlipidemia (Elevated cholesterol) Father    . Coronary Artery Disease (Blocked arteries around heart) Father    . Diabetes type II Other    . High blood pressure (Hypertension) Other    . Heart disease Other      Social History:   Social History   Socioeconomic History  . Marital status: Married  Tobacco Use  . Smoking status: Former  . Smokeless tobacco: Never  Vaping Use  . Vaping status: Never Used  Substance and Sexual Activity  . Alcohol use: No    Alcohol/week: 0.0 standard drinks of alcohol    Comment: occasional  . Drug use: No  . Sexual activity: Yes    Partners: Female   Social Drivers of Corporate investment banker Strain: Low Risk  (09/30/2023)   Overall Financial Resource Strain (CARDIA)   . Difficulty of Paying Living Expenses: Not hard at all  Food Insecurity: No Food Insecurity (09/30/2023)   Hunger Vital Sign   . Worried About Programme researcher, broadcasting/film/video in the Last Year: Never true   . Ran Out of Food in the Last Year: Never true  Transportation Needs: No Transportation Needs (09/30/2023)   PRAPARE - Transportation   . Lack of Transportation (Medical): No   . Lack of Transportation (Non-Medical): No   Received from [facility]   Social Network  Housing Stability: Low Risk  (09/30/2023)   Housing  Stability Vital Sign   . Unable to Pay for Housing in the Last Year: No   . Number of Times Moved in the Last Year: 0   . Homeless in the Last Year: No    Review of Systems:   A 10 point review of systems is negative, except for the pertinent positives and negatives detailed in the HPI.  Vitals:   Vitals:   12/03/23 1459  BP: 121/85  Pulse: 79  SpO2: 96%  Weight: 86.9 kg (191 lb 9.6 oz)  Height: 188 cm (6' 2)  Body mass index is 24.6 kg/m.  Physical Exam:  Physical Exam Vitals and nursing note reviewed.  Constitutional:      General: He is not in acute distress.    Appearance: Normal appearance. He is not ill-appearing, toxic-appearing or diaphoretic.  HENT:     Head: Normocephalic and atraumatic.     Right Ear: External ear normal.     Left Ear: External ear normal.  Eyes:     General:        Right eye: No discharge.        Left eye: No discharge.     Extraocular Movements: Extraocular movements intact.     Pupils: Pupils are equal, round, and reactive to light.  Cardiovascular:     Rate and Rhythm: Normal rate and regular rhythm.     Pulses: Normal pulses.     Heart sounds: Normal heart sounds. No murmur heard.    No friction rub. No gallop.  Abdominal:     General: Bowel sounds are normal.  Skin:    General: Skin is warm and dry.     Capillary Refill: Capillary refill takes less than 2 seconds.  Neurological:     Mental Status: He is alert.     Lab and Imaging Results:   Results DIAGNOSTIC RDI: 8 (07/2023)    Assessment and Plan:    Assessment:  Coker is a 63 y.o. year old male with the following problem:  Obstructive sleep apnea.  Patient is symptomatic with RDI 8 on HSAT from Feb 2025.  Fully compliant with old device and benefits from OSA treatment.  His old device is at least 62 years old.  He needs new CPAP , recommned AutoCPAP ResMED AirSense 11 Allergic rhinitis with sinus congestion.  Chronic pain, not well controlled.   GERD,  which may have contributed to poor sleep.    Plan:  We have reviewed his previous sleep study from Feb 20245 - patient requires new Auto CPAP please.  He benefits from CPAP therapy and is fully compliant.  His old device is >105 years old  Sleep apnea education was provided at clinic today. All the questions were answered.  The patient was advised to contact DME or our office in case of questions. We advised the patient on insurance compliance regulation and the patient verbalized understanding. Diet, exercise and weight loss were encouraged.  Avoid alcohol, esp. late in the evening. Avoid driving while sleepy. The patient was advised to avoid caffeinated beverages and chocolates.  Sleep hygiene measures were emphasized.   We advised the patient to go to [employer] and American Sleep Apnea Association websites to learn more about SDB.   We will see the patient back in 4 months.    The patient was given our contact information and instructed to contact us  with any questions or concerns. The patient verbalized understanding.      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 Previously on BiPAP, he is now using a CPAP machine, which is past its life expectancy. He has been using CPAP/BiPAP for over 15 years. He reports difficulty with a recent home sleep apnea test due to the inability to use CPAP during the test, resulting in poor sleep. He has an RDI of 8 and symptomatic sleep apnea, indicating the  need for continued CPAP therapy. The test results were skewed due to his elevated sleeping position, which is necessary to prevent reflux. - Order new CPAP device - Document reasons for CPAP necessity to facilitate device acquisition - Consider repeating home sleep apnea  test if necessary  Gastroesophageal reflux disease (GERD) He manages GERD symptoms by keeping his bed elevated to prevent reflux and limits acid intake. - Continue current management strategies for GERD  Seasonal allergies with rhinitis He experiences seasonal allergies, managed with cetirizine  (Zyrtec ). He reports that the allergies are not currently bothersome. - Continue cetirizine  (Zyrtec ) 10 mg oral once daily   I spent a total of 35 minutes in both face-to-face and non-face-to-face activities, excluding procedures performed, for this visit on the date of this encounter.     This note has been created using dictation software tool and any typographical errors are purely unintentional.  Patient received an After Visit Summary

## 2023-12-15 NOTE — Progress Notes (Signed)
 Chief complaint Hyperthyroidism  History of present illness Hector Curry is a 63 y.o. seen in follow up for subclinical hyperthyroidism. He was last seen on 10/19/2023.   History. Per his records, his TSH has remained <1.0 ulU/ml over the last several years. Free T4 has been either normal or mildly elevated. He has a positive family history of thyroid  disease in his mother and aunts (no known thyroid  cancers in anyone). He denies neck pain, anterior neck swelling, fever/ heat intolerance or changes in his voice. Has mild unilateral hand tremor that resolves on its own. He has h/o tachycardia and CAD and is s/p PCI after NSTEMI in 03/2021. He also has extensive GI issues (nausea, vomiting, gastroparesis, and dysphagia) s/p gastric bypass in 01/2021 but follows routinely with Dr. Wilhelmenia for this. He has no known use of amiodarone, lithium, or glucocorticoids. Denies recent illness or exposure to sick people. He does have recent exposure to iodinated contrast dye.   At his last visit, he was offered a trial of low-dose methimazole  with the aim of normalizing his TSH level. He states he took this for a short time, but noticed he was sweating more often, so decided to stop taking this and see a holistic specialists that he follows for routine hydration therapy. He states they ran various labs and reports they recommended he start an oral spray medication for his thyroid  (does not know the name of it) and he has been taking this daily for the last ~6 weeks. He denies any acute complaints or concerns and states he tolerates this treatment well. He states his tachycardia is controlled on propranolol  20 mg QHS.  Past Medical History:  Diagnosis Date  . Acute myocardial infarction of anterolateral wall, initial episode of care (CMS/HHS-HCC)   . Allergic rhinitis   . Arrhythmia   . Diverticulosis 10/03/2014   sigmoid colon and distal descending colon  . GERD (gastroesophageal reflux disease)   .  Hemorrhoids 10/03/2014  . History of blood transfusion   . History of kidney stones   . Hypercholesterolemia   . Hyperplastic colon polyp 10/03/2014   x 2  . Hypertension   . Sleep apnea    for which he uses a CPAP  . Sleep apnea     Past Surgical History:  Procedure Laterality Date  . Hemorrhoid surgery  1999  . EGD  06/27/2005   No Repeat  . gastic bypass  02/18/2021  . heart stent  2023  . EGD N/A 08/26/2022   Procedure: ESOPHAGOGASTRODUODENOSCOPY, FLEXIBLE, TRANSORAL; DIAGNOSTIC, INCLUDING COLLECTION OF SPECIMEN(S) BY BRUSHING OR WASHING, WHEN PERFORMED (SEPARATE PROCEDURE);  Surgeon: Alvan Gwendlyn Bars, MD;  Location: [facility];  Service: Gastroenterology;  Laterality: N/A;  . LAPAROSCOPIC converted to OPEN GASTRIC BYPASS REVERSAL, TAKEDOWN OF GASTROJEJUNAL ANASTOMOSIS, GASTRO GASTRIC ANASTOMOSIS WITH UPPER ENDOSCOPY  12/02/2022   Dr. Stevie  . COLONOSCOPY  10/03/2014, 06/27/2005   Dr. EMERSON Mariner @ [facility] - Hyperplastic Polyp, 10 yr rpt per [facility]  . ENDOSCOPIC CARPAL TUNNEL RELEASE Left   . TONSILLECTOMY      Outpatient Medications Marked as Taking for the 12/15/23 encounter (Office Visit) with Brendia Calton Squires, PA  Medication Sig Dispense Refill  . artificial tears (GONIOSCOP-HPM) 2.5 % ophthalmic solution Apply to eye.    . cetirizine  (ZYRTEC ) 10 MG tablet Take 10 mg by mouth once daily    . clopidogreL  (PLAVIX ) 75 mg tablet TAKE 15 MLS (3TSP) BY G TUBE ONCE DAILY 450 tablet 2  . ergocalciferol , vitamin  D2, 1,250 mcg (50,000 unit) capsule Take 1 capsule (50,000 Units total) by mouth once a week for 8 doses 8 capsule 0  . fluticasone  propionate (FLONASE  NASAL) by Nasal route    . insulin  LISPRO (HUMALOG  KWIKPEN) pen injector (concentration 100 units/mL) Inject subcutaneously 3 (three) times daily with meals Per sliding scale 15 mL 1  . propranoloL  (INDERAL ) 20 MG tablet Take 1 tablet (20 mg total) by mouth 3 (three) times daily (Patient  taking differently: Take 20 mg by mouth 3 (three) times daily Takes nightly) 90 tablet 11  . RABEprazole  (ACIPHEX ) 20 mg EC tablet Take 20 mg by mouth 2 (two) times daily      Exam BP 122/78   Pulse 83   Ht 188 cm (6' 2)   Wt 87.1 kg (192 lb)   SpO2 96%   BMI 24.65 kg/m  GEN: well developed male in NAD. HEENT: No proptosis. No lid lag or stare.   NECK: supple, trachea midline, thyroid  not enlarged   NEURO: PERRL, EOMI. PSYC: alert and oriented, good insight.   Laboratory 11/21/2019: TSH = 0.897  11/13/2020: TSH = 0.914 08/21/2021: TSH = 0.392  03/13/2022: TSI < 0.10 (negative)  10/03/2022: TSH = 0.472 01/21/2023: TSH = 0.303 02/02/2023: TSH = 0.269, Free T4 = 0.91, Total T3 = 111 06/29/2023: TSH = 0.214  08/27/2023: TSH = 0.283, Free T4 = 1.25, Total T3 = 80 09/14/2023: TSH = 0.136, Free T4 = 1.07 12/08/2023: TSH = 0.436, Free T4 = 0.92, TRAb < 1.10   Assessment 1. Subclinical hyperthyroidism     Plan - Subclinical hyperthyroidism is stable. Ok to remain off methimazole  for now. Again reviewed the recommendations of achieving high-normal TSH levels, if possible, to avoid cardiac arrhythmia given his history. He does not exhibit any clinical features of hyperthyroidism and elects to pursue therapy with hydration specialists (who recommended the oral spray to help with his thyroid  function). Recommended he continue to have labs monitored periodically to ensure levels remain stable and he acknowledges understanding and agrees to this play. He will also notify me of the name of the spray he is using and we will add it to his medication list.   - No clinical findings of thyromegaly or goiter. No complaints of compressive symptoms. Will hold off on pursuing ultrasound this time.  - Follow up in 6 months to monitor thyroid  function studies.     Attestation Statement:   I personally performed the service, non-incident to. (WP)   CASSANDRA BUEL COHN, PA

## 2023-12-18 ENCOUNTER — Other Ambulatory Visit (HOSPITAL_COMMUNITY): Payer: Self-pay

## 2023-12-18 ENCOUNTER — Ambulatory Visit: Payer: Managed Care, Other (non HMO) | Admitting: Gastroenterology

## 2023-12-23 ENCOUNTER — Other Ambulatory Visit (HOSPITAL_COMMUNITY): Payer: Self-pay

## 2023-12-25 ENCOUNTER — Other Ambulatory Visit (HOSPITAL_COMMUNITY): Payer: Self-pay

## 2023-12-25 MED ORDER — DOXYCYCLINE HYCLATE 100 MG PO CAPS
100.0000 mg | ORAL_CAPSULE | Freq: Two times a day (BID) | ORAL | 0 refills | Status: DC
  Filled 2023-12-25: qty 14, 7d supply, fill #0

## 2024-01-19 ENCOUNTER — Other Ambulatory Visit: Payer: Self-pay

## 2024-01-19 ENCOUNTER — Other Ambulatory Visit (HOSPITAL_COMMUNITY): Payer: Self-pay

## 2024-01-19 ENCOUNTER — Other Ambulatory Visit: Payer: Self-pay | Admitting: Internal Medicine

## 2024-01-19 MED ORDER — CLOPIDOGREL BISULFATE 75 MG PO TABS
75.0000 mg | ORAL_TABLET | Freq: Every day | ORAL | 2 refills | Status: AC
  Filled 2024-01-19: qty 90, 90d supply, fill #0
  Filled 2024-05-04: qty 90, 90d supply, fill #1
  Filled 2024-08-08: qty 90, 90d supply, fill #2

## 2024-01-20 ENCOUNTER — Other Ambulatory Visit (HOSPITAL_COMMUNITY): Payer: Self-pay

## 2024-01-20 ENCOUNTER — Other Ambulatory Visit: Payer: Self-pay

## 2024-02-08 ENCOUNTER — Other Ambulatory Visit (HOSPITAL_COMMUNITY): Payer: Self-pay

## 2024-03-04 ENCOUNTER — Ambulatory Visit: Admitting: Gastroenterology

## 2024-03-04 ENCOUNTER — Encounter: Payer: Self-pay | Admitting: Gastroenterology

## 2024-03-04 VITALS — BP 112/68 | HR 71 | Ht 74.0 in | Wt 202.4 lb

## 2024-03-04 DIAGNOSIS — K3184 Gastroparesis: Secondary | ICD-10-CM

## 2024-03-04 DIAGNOSIS — K222 Esophageal obstruction: Secondary | ICD-10-CM

## 2024-03-04 DIAGNOSIS — Z8639 Personal history of other endocrine, nutritional and metabolic disease: Secondary | ICD-10-CM

## 2024-03-04 DIAGNOSIS — K2100 Gastro-esophageal reflux disease with esophagitis, without bleeding: Secondary | ICD-10-CM | POA: Diagnosis not present

## 2024-03-04 DIAGNOSIS — Z1211 Encounter for screening for malignant neoplasm of colon: Secondary | ICD-10-CM

## 2024-03-04 NOTE — Patient Instructions (Signed)
 You will be due for a recall colonoscopy in 2027. We will send you a reminder in the mail when it gets closer to that time.  _______________________________________________________  If your blood pressure at your visit was 140/90 or greater, please contact your primary care physician to follow up on this.  _______________________________________________________  If you are age 62 or older, your body mass index should be between 23-30. Your Body mass index is 25.98 kg/m. If this is out of the aforementioned range listed, please consider follow up with your Primary Care Provider.  If you are age 56 or younger, your body mass index should be between 19-25. Your Body mass index is 25.98 kg/m. If this is out of the aformentioned range listed, please consider follow up with your Primary Care Provider.   ________________________________________________________  The [facility] providers would like to encourage you to use MYCHART to communicate with providers for non-urgent requests or questions.  Due to long hold times on the telephone, sending your provider a message by [facility] may be a faster and more efficient way to get a response.  Please allow 48 business hours for a response.  Please remember that this is for non-urgent requests.  _______________________________________________________  Thank you for choosing me and [facility].  Dr. Wilhelmenia

## 2024-03-07 ENCOUNTER — Encounter: Payer: Self-pay | Admitting: Gastroenterology

## 2024-03-07 NOTE — Progress Notes (Signed)
 GASTROENTEROLOGY OUTPATIENT CLINIC VISIT   Primary Care Provider Auston Reyes BIRCH, MD [address] [facility] [place] [address] [phone]  Patient Profile: Hector Curry is a 63 y.o. 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  The patient presents to the [facility] for an evaluation and management of problem(s) noted below:  Problem List 1. Gastroesophageal reflux disease with esophagitis without hemorrhage   2. Gastroparesis   3. Esophageal stricture   4. Colon cancer screening   5. History of iron  deficiency    Discussed the use of AI scribe software for clinical note transcription with the patient, who gave verbal consent to proceed.  History of Present Illness Please see prior progress notes for full details of HPI.  Interval History The patient returns for follow-up of underlying gastroparesis and esophageal anastomosis stenosis and constipation.  He continues to experience ongoing issues with gastroparesis.  He is now in a routine however and has been able to maintain his weight.  He has low energy levels and wishes for improvement and is following with hematology.  We trialed a Botox  injection of his pylorus, but per patient it made no difference for him.  He has more good days than bad.  He is able to work, perform household tasks, and engage in activities such as working on his tractor.  His current dietary routine includes having a decent breakfast, sometimes a small lunch, and avoiding eating past 3 PM. He occasionally  has a small bowl of vanilla bean ice cream in the evening. He sleeps with his bed elevated to prevent reflux, which occurs if he does not maintain this position.  He is not currently taking metoclopramide  due to apprehension from side-effect profile.  He has not been prescribed domperidone previously.  He is not sure if he would want referral for G-POEM.   GI Review of Systems Positive as above Negative for dysphagia, odynophagia, melena, hematochezia, pain   Review of Systems General: Denies fevers/chills/unintentional weight loss Cardiovascular: Denies chest pain Pulmonary: Denies shortness of breath Gastroenterological: See HPI Genitourinary: Denies darkened urine Hematological: Positive for easy bruising/bleeding due to antiplatelet therapy Dermatological: Denies jaundice Psychological: Mood is stable   Medications Current Outpatient Medications  Medication Sig Dispense Refill   cetirizine  (ZYRTEC ) 10 MG tablet Take by mouth.     clopidogrel  (PLAVIX ) 75 MG tablet Take 1 tablet (75 mg total) by mouth daily. 90 tablet 2   Continuous Glucose Sensor (FREESTYLE LIBRE 14 DAY SENSOR) MISC Insert 1 sensor to the back of the arm as directed every 14 days for continuous glucose monitoring 6 each 1   doxycycline  (VIBRAMYCIN ) 100 MG capsule Take 1 capsule (100 mg total) by mouth 2 (two) times daily for 7 days 14 capsule 0   ergocalciferol  (VITAMIN D2) 1.25 MG (50000 UT) capsule Take 1 capsule (50,000 Units total) by mouth once a week. 8 capsule 0   fluticasone  (FLONASE ) 50 MCG/ACT nasal spray Place 2-3 sprays into both nostrils daily as needed for allergies or rhinitis.     insulin  lispro (HUMALOG ) 100 UNIT/ML KwikPen Inject subcutaneously 3 (three) times daily with meals Per sliding scale (Patient taking differently: Inject 3-7 Units into the skin daily as  needed (For high blood sugar).) 15 mL 1   Insulin  Pen Needle (UNIFINE PENTIPS) 32G X 4 MM MISC Use as needed as directed 100 each 3    methimazole  (TAPAZOLE ) 5 MG tablet Take 1 tablet (5 mg total) by mouth once daily 30 tablet 6   mometasone  (ELOCON ) 0.1 % lotion Apply to affected areas once daily (Patient taking differently: Apply 1 Application topically as needed (psoriasis).) 60 mL 5   Multiple Vitamin (MULTI-VITAMIN DAILY PO) Take 1 patch by mouth daily.     ondansetron  (ZOFRAN -ODT) 4 MG disintegrating tablet Take 1 tablet (4 mg total) by mouth every 6 (six) hours as needed for nausea or vomiting. 20 tablet 0   OVER THE COUNTER MEDICATION Apply 1 Application topically daily at 6 (six) AM. Medication: Bariatric patch     prochlorperazine  (COMPAZINE ) 25 MG suppository Place 1 suppository (25 mg total) rectally every 12 (twelve) hours as needed for nausea or vomiting. 6 suppository 0   prochlorperazine  (COMPAZINE ) 5 MG tablet Take 1 tablet (5 mg total) by mouth every 6 (six) hours as needed for nausea or vomiting. 30 tablet 0   propranolol  (INDERAL ) 20 MG tablet Take 1 tablet (20 mg total) by mouth 3 (three) times daily. (Patient taking differently: Take 20 mg by mouth at bedtime.) 90 tablet 11   propranolol  40 MG/5ML solution Take 5 mg by mouth every evening. As needed     Propylene Glycol (SYSTANE BALANCE OP) Place 2-3 drops into both eyes daily as needed (dry eyes).     Pseudoeph-Doxylamine-DM-APAP (NYQUIL PO) Take 2 tablets by mouth at bedtime as needed (pain).     RABEprazole  (ACIPHEX ) 20 MG tablet Take 1 tablet (20 mg total) by mouth in the morning and at bedtime. 60 tablet 6   senna (SENOKOT) 8.6 MG tablet Take 1 tablet by mouth 2 (two) times daily.     VITAMIN E PO Take 1 tablet by mouth daily.     metoCLOPramide  (REGLAN ) 5 MG tablet Take 1 tablet (5 mg total) by mouth at bedtime. (Patient not taking: Reported on 03/04/2024) 60 tablet 1   nitroGLYCERIN  (NITROSTAT ) 0.4 MG SL tablet Place 1 tablet (0.4 mg total) under the tongue every 5 (five) minutes as needed for chest pain. (Patient not taking: Reported on 09/02/2023) 25  tablet 3   No current facility-administered medications for this visit.    Allergies Allergies  Allergen Reactions   Other Itching and Other (See Comments)    Cats Wheezing, runny nose/eyes, congestion     Histories Past Medical History:  Diagnosis Date   Allergic rhinitis    Allergy    Atrial tachycardia (HCC)    CAD (coronary artery disease)    a. 05/08/2015 Cath: no significant CAD, LVEF nl-->Med; b. 07/2017 MV: attenuation artifact, no ischemia, EF 65%-->Low risk; c. 03/2021 NSTEMI/PCI: LM nl, LAD 80p (3.0x18 Onyx Frontier DES), D1 90 (too small for PCI), LCX nl, RCA nl.   Diabetes mellitus without complication (HCC)    Diastolic dysfunction    a. 04/2015 Echo: EF 60-65%; b. 05/2016 Echo: EF 60-65%, GrI DD; c. 07/2017 Echo: EF 55-60%, Ao root 42mm; d. 03/2019 Echo: EF 55-60%, Ao root/Asc Ao 42mm; e. 03/2021 Echo: EF 50-55%, apical HK, mod asymm basal-septal hypertrophy. Nl RV fxn. Asc Ao 44mm.   Dilated aortic root (HCC)    a. 07/2017 Echo: 42mm Ao root - mildly dil; b. 03/2019 Echo: Ao root 42mm; c. 03/2021 Echo: Asc Ao 44mm.   Diverticulosis 10/03/2014  Dysrhythmia    Esophageal stricture    a.  In setting of lap band June 2022-stricture at the GJ anastomosis requiring gastrostomy tube; b. 05/2021 s/p esoph stenting (15mm); c. 07/2021 s/p esoph stenting (20mm).   Family history of premature CAD    a. father passed from MI at 14   GERD (gastroesophageal reflux disease)    GIB (gastrointestinal bleeding)    a. 07/2021 following esoph stenting.   Hemorrhoids    a. internal hemorrhoids s/p surgery 1999   History of kidney stones    History of tobacco abuse    Hyperlipidemia    Hyperplastic colon polyp 10/03/2014   a. x 2    Hypertension    Inflammatory arthritis    a. CCP antibodies & x-rays negative. Rheumatoid factor 14, felt to be crystaline over RA or psoriatic   Iron  deficiency anemia 02/05/2022   Morbid obesity (HCC)    a. s/p LAP-BAND - complicated by esoph  stricture.   Myocardial infarction [facility])    NSTEMI   Nausea and vomiting 04/11/2023   OSA (obstructive sleep apnea)    a. on CPAP   Osteoarthritis    PSVT (paroxysmal supraventricular tachycardia) (HCC)    a. 48 hr Holter 04/2015: NSR w/ rare PVC, short runs of narrow complex tachycardiac, possible atrial tach, longest run 7 beats, PACs noted (2% of all beats 3600 total) they did not seem to correlate w/ significant arrythmia; b. 08/2017 Event monitor: no significant arrhythmias.   Sleep apnea    Past Surgical History:  Procedure Laterality Date   BALLOON DILATION N/A 08/14/2021   Procedure: BALLOON DILATION;  Surgeon: Mansouraty, Aloha Raddle., MD;  Location: [facility];  Service: Gastroenterology;  Laterality: N/A;   BALLOON DILATION N/A 02/20/2022   Procedure: BALLOON DILATION;  Surgeon: Teressa Toribio SQUIBB, MD;  Location: [facility];  Service: Gastroenterology;  Laterality: N/A;   BALLOON DILATION N/A 03/27/2022   Procedure: BALLOON DILATION;  Surgeon: Wilhelmenia Aloha Raddle., MD;  Location: [facility] ENDOSCOPY;  Service: Gastroenterology;  Laterality: N/A;   BARIATRIC SURGERY  01/2021   lap band    BILIARY STENT PLACEMENT N/A 08/14/2021   Procedure: AXIOS STENT PLACEMENT;  Surgeon: Wilhelmenia Aloha Raddle., MD;  Location: [facility];  Service: Gastroenterology;  Laterality: N/A;   BILIARY STENT PLACEMENT N/A 10/16/2022   Procedure: STENT PLACEMENT;  Surgeon: Wilhelmenia Aloha Raddle., MD;  Location: [facility];  Service: Gastroenterology;  Laterality: N/A;   BILIARY STENT PLACEMENT N/A 04/09/2023   Procedure: AXIOS STENT PLACEMENT;  Surgeon: Wilhelmenia Aloha Raddle., MD;  Location: [facility];  Service: Gastroenterology;  Laterality: N/A;   BIOPSY  07/08/2021   Procedure: BIOPSY;  Surgeon: Wilhelmenia Aloha Raddle., MD;  Location: [facility];  Service: Gastroenterology;;   BIOPSY  08/14/2021   Procedure: BIOPSY;  Surgeon: Wilhelmenia Aloha Raddle., MD;  Location: [facility];  Service:  Gastroenterology;;   BIOPSY  10/16/2022   Procedure: BIOPSY;  Surgeon: Wilhelmenia Aloha Raddle., MD;  Location: [facility];  Service: Gastroenterology;;   BOTOX  INJECTION N/A 10/22/2023   Procedure: BOTOX  INJECTION;  Surgeon: Wilhelmenia Aloha Raddle., MD;  Location: [facility];  Service: Gastroenterology;  Laterality: N/A;   CARDIAC CATHETERIZATION N/A 05/08/2015   Procedure: Left Heart Cath and Coronary Angiography;  Surgeon: Candyce GORMAN Reek, MD;  Location: [facility] INVASIVE CV LAB;  Service: Cardiovascular;  Laterality: N/A;   CARPAL TUNNEL RELEASE Bilateral    CHOLECYSTECTOMY     COLONOSCOPY  06/27/2005   COLONOSCOPY  10/03/2014   COLONOSCOPY WITH PROPOFOL   N/A 10/22/2023   Procedure: COLONOSCOPY WITH PROPOFOL ;  Surgeon: Wilhelmenia Aloha Raddle., MD;  Location: [facility];  Service: Gastroenterology;  Laterality: N/A;   CORONARY STENT INTERVENTION N/A 04/15/2021   Procedure: CORONARY STENT INTERVENTION;  Surgeon: Court Dorn PARAS, MD;  Location: [facility] INVASIVE CV LAB;  Service: Cardiovascular;  Laterality: N/A;   DUODENAL STENT PLACEMENT N/A 11/18/2021   Procedure: DUODENAL STENT PLACEMENT;  Surgeon: Wilhelmenia Aloha Raddle., MD;  Location: [facility];  Service: Gastroenterology;  Laterality: N/A;  axios placed at GJ anastamosis   DUODENAL STENT PLACEMENT  03/27/2022   Procedure: GASTRIC STENT PLACEMENT;  Surgeon: Wilhelmenia Aloha Raddle., MD;  Location: [facility] ENDOSCOPY;  Service: Gastroenterology;;   ESOPHAGEAL DILATION  05/27/2021   Procedure: ESOPHAGEAL DILATION;  Surgeon: Wilhelmenia Aloha Raddle., MD;  Location: [facility];  Service: Gastroenterology;;   ESOPHAGEAL DILATION  01/23/2022   Procedure: ESOPHAGEAL DILATION;  Surgeon: Wilhelmenia Aloha Raddle., MD;  Location: [facility] ENDOSCOPY;  Service: Gastroenterology;;   ESOPHAGEAL DILATION  04/09/2023   Procedure: ESOPHAGEAL DILATION;  Surgeon: Wilhelmenia Aloha Raddle., MD;  Location: [facility];  Service: Gastroenterology;;   ESOPHAGEAL STENT  PLACEMENT N/A 05/27/2021   Procedure: ESOPHAGEAL STENT PLACEMENT;  Surgeon: Wilhelmenia Aloha Raddle., MD;  [facility];  Service: Gastroenterology;  Laterality: N/A;   ESOPHAGOGASTRODUODENOSCOPY  06/27/2005   ESOPHAGOGASTRODUODENOSCOPY N/A 03/19/2021   Procedure: ESOPHAGOGASTRODUODENOSCOPY (EGD);  Surgeon: Stevie, Herlene Righter, MD;  Location: [facility];  Service: General;  Laterality: N/A;   ESOPHAGOGASTRODUODENOSCOPY N/A 08/16/2021   Procedure: ESOPHAGOGASTRODUODENOSCOPY (EGD);  Surgeon: Wilhelmenia Aloha Raddle., MD;  Location: [facility] ENDOSCOPY;  Service: Gastroenterology;  Laterality: N/A;   ESOPHAGOGASTRODUODENOSCOPY (EGD) WITH PROPOFOL  N/A 05/27/2021   Procedure: ESOPHAGOGASTRODUODENOSCOPY (EGD) WITH PROPOFOL ;  Surgeon: Wilhelmenia Aloha Raddle., MD;  [facility];  Service: Gastroenterology;  Laterality: N/A;   ESOPHAGOGASTRODUODENOSCOPY (EGD) WITH PROPOFOL  N/A 07/08/2021   Procedure: ESOPHAGOGASTRODUODENOSCOPY (EGD) WITH PROPOFOL ;  Surgeon: Wilhelmenia Aloha Raddle., MD;  [facility]: [facility];  Service: Gastroenterology;  Laterality: N/A;  fluoro   ESOPHAGOGASTRODUODENOSCOPY (EGD) WITH PROPOFOL  N/A 08/14/2021   Procedure: ESOPHAGOGASTRODUODENOSCOPY (EGD) WITH PROPOFOL ;  Surgeon: Wilhelmenia Aloha Raddle., MD;  Location: [facility];  Service: Gastroenterology;  Laterality: N/A;  fluoro Axios stent (20 mm)   ESOPHAGOGASTRODUODENOSCOPY (EGD) WITH PROPOFOL  N/A 10/16/2021   Procedure: ESOPHAGOGASTRODUODENOSCOPY (EGD) WITH PROPOFOL ;  Surgeon: Wilhelmenia Aloha Raddle., MD;  Location: [facility];  Service: Gastroenterology;  Laterality: N/A;  axios stent pull fluoro   ESOPHAGOGASTRODUODENOSCOPY (EGD) WITH PROPOFOL  N/A 11/18/2021   Procedure: ESOPHAGOGASTRODUODENOSCOPY (EGD) WITH PROPOFOL ;  Surgeon: Wilhelmenia Aloha Raddle., MD;  [facility];  Service: Gastroenterology;  Laterality: N/A;   ESOPHAGOGASTRODUODENOSCOPY (EGD) WITH PROPOFOL  N/A 01/23/2022   Procedure:  ESOPHAGOGASTRODUODENOSCOPY (EGD) WITH PROPOFOL ;  Surgeon: Wilhelmenia Aloha Raddle., MD;  Location: [facility] ENDOSCOPY;  Service: Gastroenterology;  Laterality: N/A;   ESOPHAGOGASTRODUODENOSCOPY (EGD) WITH PROPOFOL  N/A 02/20/2022   Procedure: ESOPHAGOGASTRODUODENOSCOPY (EGD) WITH PROPOFOL ;  Surgeon: Teressa Toribio SQUIBB, MD;  [facility];  Service: Gastroenterology;  Laterality: N/A;   ESOPHAGOGASTRODUODENOSCOPY (EGD) WITH PROPOFOL  N/A 03/27/2022   Procedure: ESOPHAGOGASTRODUODENOSCOPY (EGD) WITH PROPOFOL ;  Surgeon: Wilhelmenia Aloha Raddle., MD;  Location: [facility] ENDOSCOPY;  Service: Gastroenterology;  Laterality: N/A;   ESOPHAGOGASTRODUODENOSCOPY (EGD) WITH PROPOFOL  N/A 08/14/2022   Procedure: ESOPHAGOGASTRODUODENOSCOPY (EGD) WITH PROPOFOL ;  Surgeon: Wilhelmenia Aloha Raddle., MD;  [facility];  Service: Gastroenterology;  Laterality: N/A;   ESOPHAGOGASTRODUODENOSCOPY (EGD) WITH PROPOFOL  N/A 10/16/2022   Procedure: ESOPHAGOGASTRODUODENOSCOPY (EGD) WITH PROPOFOL ;  Surgeon: Wilhelmenia Aloha Raddle., MD;  [facility];  Service: Gastroenterology;  Laterality: N/A;  ESOPHAGOGASTRODUODENOSCOPY (EGD) WITH PROPOFOL  N/A 11/28/2022   Procedure: ESOPHAGOGASTRODUODENOSCOPY (EGD) WITH PROPOFOL  WITH AXIOS STENT REMOVAL;  Surgeon: Wilhelmenia Aloha Raddle., MD;  Location: [facility] ENDOSCOPY;  Service: Gastroenterology;  Laterality: N/A;   ESOPHAGOGASTRODUODENOSCOPY (EGD) WITH PROPOFOL  N/A 04/09/2023   Procedure: ESOPHAGOGASTRODUODENOSCOPY (EGD) WITH PROPOFOL ;  Surgeon: Wilhelmenia Aloha Raddle., MD;  [facility];  Service: Gastroenterology;  Laterality: N/A;  dilation - axios stent   ESOPHAGOGASTRODUODENOSCOPY (EGD) WITH PROPOFOL  N/A 04/11/2023   Procedure: ESOPHAGOGASTRODUODENOSCOPY (EGD) WITH PROPOFOL ;  Surgeon: Albertus Gordy HERO, MD;  Location: [facility];  Service: Gastroenterology;  Laterality: N/A;   ESOPHAGOGASTRODUODENOSCOPY (EGD) WITH PROPOFOL  N/A 04/30/2023   Procedure: ESOPHAGOGASTRODUODENOSCOPY  (EGD) WITH PROPOFOL ;  Surgeon: Wilhelmenia Aloha Raddle., MD;  Location: [facility];  Service: Gastroenterology;  Laterality: N/A;  axios stent pull   ESOPHAGOGASTRODUODENOSCOPY (EGD) WITH PROPOFOL  N/A 10/22/2023   Procedure: ESOPHAGOGASTRODUODENOSCOPY (EGD) WITH PROPOFOL ;  Surgeon: Wilhelmenia Aloha Raddle., MD;  Location: [facility];  Service: Gastroenterology;  Laterality: N/A;   FLEXIBLE SIGMOIDOSCOPY N/A 08/16/2021   Procedure: FLEXIBLE SIGMOIDOSCOPY;  Surgeon: Wilhelmenia Aloha Raddle., MD;  Location: [facility] ENDOSCOPY;  Service: Gastroenterology;  Laterality: N/A;   FOREIGN BODY REMOVAL  02/20/2022   Procedure: FOREIGN BODY REMOVAL;  Surgeon: Teressa Toribio SQUIBB, MD;  Location: [facility];  Service: Gastroenterology;;   FOREIGN BODY REMOVAL ESOPHAGEAL  03/27/2022   Procedure: REMOVAL FOREIGN BODY;  Surgeon: Wilhelmenia Aloha Raddle., MD;  Location: [facility] ENDOSCOPY;  Service: Gastroenterology;;   GASTRIC ROUX-EN-Y N/A 02/18/2021   Procedure: LAPAROSCOPIC ROUX-EN-Y GASTRIC BYPASS WITH UPPER ENDOSCOPY,;  Surgeon: Stevie Herlene Righter, MD;  Location: [facility];  Service: General;  Laterality: N/A;   GASTRIC ROUX-EN-Y N/A 12/02/2022   Procedure: LAPAROSCOPIC converted to OPEN GASTRIC BYPASS REVERSAL, TAKEDOWN OF GASTROJEJUNAL ANASTOMOSIS, GASTRO GASTRIC ANASTOMOSIS WITH UPPER ENDOSCOPY 1. Laparoscopic lysis of adhesions 2. Partial gastrectomy 3. Small bowel resection 4. Creation of esophagus to gastric anastomosis 5. Creation of gastrostomy;  Surgeon: Kinsinger, Herlene Righter, MD;  Location: [facility];  Service: General;  Laterality:    GASTROINTESTINAL STENT REMOVAL  01/23/2022   Procedure: JUSTINA CARLS REMOVAL;  Surgeon: Wilhelmenia Aloha Raddle., MD;  Location: [facility] ENDOSCOPY;  Service: Gastroenterology;;   GASTROINTESTINAL STENT REMOVAL N/A 04/30/2023   Procedure: GASTROINTESTINAL STENT REMOVAL;  Surgeon: Wilhelmenia Aloha Raddle., MD;  Location: [facility];  Service: Gastroenterology;  Laterality: N/A;   GASTROSTOMY N/A  12/02/2022   Procedure: INSERTION OF GASTROSTOMY TUBE;  Surgeon: Stevie Herlene Righter, MD;  Location: [facility];  Service: General;  Laterality: N/A;   HEMORRHOID SURGERY  08/25/1997   IR GJ TUBE CHANGE  03/27/2021   LAPAROSCOPIC INSERTION GASTROSTOMY TUBE N/A 03/20/2021   Procedure: LAPAROSCOPIC INSERTION GASTROSTOMY TUBE;  Surgeon: Stevie Herlene Righter, MD;  Location: [facility];  Service: General;  Laterality: N/A;   LEFT HEART CATH AND CORONARY ANGIOGRAPHY N/A 04/15/2021   Procedure: LEFT HEART CATH AND CORONARY ANGIOGRAPHY;  Surgeon: Court Dorn PARAS, MD;  Location: [facility] INVASIVE CV LAB;  Service: Cardiovascular;  Laterality: N/A;   MOUTH SURGERY     removed area which was benign   SCLEROTHERAPY  04/30/2023   Procedure: SCLEROTHERAPY;  Surgeon: Mansouraty, Aloha Raddle., MD;  Location: [facility];  Service: Gastroenterology;;   CARLS REMOVAL  07/08/2021   Procedure: CARLS REMOVAL;  Surgeon: Wilhelmenia Aloha Raddle., MD;  Location: [facility];  Service: Gastroenterology;;   CARLS REMOVAL  10/16/2021   Procedure: STENT REMOVAL;  Surgeon: Wilhelmenia Aloha Raddle., MD;  Location: [facility];  Service: Gastroenterology;;   CARLS REMOVAL  08/14/2022  Procedure: STENT REMOVAL;  Surgeon: Wilhelmenia Aloha Raddle., MD;  Location: [facility];  Service: Gastroenterology;;   CLEDA REMOVAL  11/28/2022   Procedure: STENT REMOVAL;  Surgeon: Wilhelmenia Aloha Raddle., MD;  Location: [facility] ENDOSCOPY;  Service: Gastroenterology;;   CLEDA REMOVAL  04/11/2023   Procedure: STENT REMOVAL;  Surgeon: Albertus Gordy HERO, MD;  Location: [facility];  Service: Gastroenterology;;   STERIOD INJECTION  01/23/2022   Procedure: STEROID INJECTION;  Surgeon: Wilhelmenia Aloha Raddle., MD;  Location: [facility] ENDOSCOPY;  Service: Gastroenterology;;   SUBMUCOSAL INJECTION  11/18/2021   Procedure: SUBMUCOSAL INJECTION;  Surgeon: Wilhelmenia Aloha Raddle., MD;  Location: [facility];  Service: Gastroenterology;;   SUBMUCOSAL INJECTION  08/14/2022    Procedure: SUBMUCOSAL INJECTION;  Surgeon: Wilhelmenia Aloha Raddle., MD;  Location: [facility];  Service: Gastroenterology;;   TONSILLECTOMY     UPPER GI ENDOSCOPY N/A 03/20/2021   Procedure: UPPER GI ENDOSCOPY;  Surgeon: Kinsinger, Herlene Righter, MD;  Location: [facility];  Service: General;  Laterality: N/A;   Social History   Socioeconomic History   Marital status: Married    Spouse name: Not on file   Number of children: 3   Years of education: Not on file   Highest education level: Not on file  Occupational History   Occupation: IT IT trainer  Tobacco Use   Smoking status: Former    Current packs/day: 0.00    Average packs/day: 1 pack/day for 15.0 years (15.0 ttl pk-yrs)    Types: Cigarettes    Start date: 08/05/1974    Quit date: 08/05/1989    Years since quitting: 34.6   Smokeless tobacco: Never  Vaping Use   Vaping status: Never Used  Substance and Sexual Activity   Alcohol use: Not Currently   Drug use: Never   Sexual activity: Not Currently  Other Topics Concern   Not on file  Social History Narrative   Not on file   Social Drivers of Health   Financial Resource Strain: Low Risk  (09/30/2023)   Received from [facility]   Overall Financial Resource Strain (CARDIA)    Difficulty of Paying Living Expenses: Not hard at all  Food Insecurity: No Food Insecurity (09/30/2023)   Received from [facility]   Hunger Vital Sign    Within the past 12 months, you worried that your food would run out before you got the money to buy more.: Never true    Within the past 12 months, the food you bought just didn't last and you didn't have money to get more.: Never true  Transportation Needs: No Transportation Needs (09/30/2023)   Received from [facility] - Transportation    In the past 12 months, has lack of transportation kept you from medical appointments or from getting medications?: No    Lack of Transportation  (Non-Medical): No  Physical Activity: Not on file  Stress: Not on file  Social Connections: Unknown (04/14/2023)   [facility]   Social Network    Social Network: Not on file  Intimate Partner Violence: Unknown (04/14/2023)   Received from [facility]   HITS    Physically Hurt: Not on file    Insult or Talk Down To: Not on file    Threaten Physical Harm: Not on file    Scream or Curse: Not on file   Family History  Problem Relation Age of Onset   Hypertension Mother    Diabetes Mother  Heart attack Father 24   CAD Father    Hypertension Sister    Diabetes Other    Hypertension Other    Heart disease Other    Colon cancer Neg Hx    Esophageal cancer Neg Hx    Pancreatic cancer Neg Hx    Stomach cancer Neg Hx    Liver disease Neg Hx    Inflammatory bowel disease Neg Hx    Rectal cancer Neg Hx    I have reviewed his medical, social, and family history in detail and updated the electronic medical record as necessary.    PHYSICAL EXAMINATION  BP 112/68   Pulse 71   Ht 6' 2 (1.88 m)   Wt 202 lb 6 oz (91.8 kg)   BMI 25.98 kg/m  Wt Readings from Last 3 Encounters:  03/04/24 202 lb 6 oz (91.8 kg)  10/22/23 174 lb (78.9 kg)  09/02/23 177 lb (80.3 kg)  GEN: NAD, appears older than stated age, doesn't appear chronically ill PSYCH: Cooperative, without pressured speech EYE: Conjunctivae pink, sclerae anicteric ENT: MMM CV: Nontachycardic RESP: No audible wheezing GI: NABS, soft, protuberant abdomen, surgical scars present, without rebound MSK/EXT: No significant lower extremity edema SKIN: No jaundice NEURO:  Alert & Oriented x 3, no focal deficits   REVIEW OF DATA  I reviewed the following data at the time of this encounter:  GI Procedures and Studies  February 2025 EGD - No gross lesions in the proximal esophagus and in the mid esophagus. - LA Grade B esophagitis with no bleeding found distally. 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 Injected with botulinum toxin. - No gross lesions in the duodenal bulb, in the first portion of the duodenum and in the second portion of the duodenum.  February 2025 colonoscopy - Preparation of the colon was poor. - Hemorrhoids found on digital rectal exam. - Stool in the entire examined colon. - Non-bleeding non-thrombosed external and internal hemorrhoids.  Laboratory Studies  Reviewed those in epic   Imaging Studies  No new imaging to review   ASSESSMENT  Mr. Anzalone is a 63 y.o. 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  The patient is seen today for evaluation and management of:  1. Gastroesophageal reflux disease with esophagitis without hemorrhage   2. Gastroparesis   3. Esophageal stricture   4. Colon cancer screening   5. History of iron  deficiency    The patient is hemodynamically stable.  Clinically, patient continues to have symptomatology of gastroparesis.  However he is managing his symptoms accordingly.  No effect from pylorus Botox  injection.  Discussed consideration of G POEM referral but he is hesitant on that currently as he is okay in his current daily routine.  We can consider that in the future.  Metoclopramide  still an option though he is hesitant about medication side effects.  Briefly discussed domperidone use as well.  Patient will continue PPI therapy.  He is at risk of esophageal anastomosis narrowing if gastroparesis remains difficult to control because of reflux and stasis.  He will update us  if there  are issues that are occurring that would suggest need for earlier repeat upper endoscopy  for dilation of his esophagus, as he knows his symptomatology.  We will plan to repeat his upper endoscopy at time of his colonoscopy.  Will plan repeat colonoscopy in 2026 per patient desire within 1 year of his procedure.  Will do a 2-day preparation with Sutab and with Suprep.  He will continue to follow-up with hematology.  Can consider potential video capsule endoscopy if necessary due to persistently iron  deficient state future (though little bit more risk of retention or capsule completion early as result of his postsurgical anatomy).  Follow-up as needed until his endoscopic evaluation is done early next year.  All patient questions were answered to the best of my ability, and the patient agrees to the aforementioned plan of action with follow-up as indicated.   PLAN  Colonoscopy today preparation to be scheduled for beginning of 2026 per patient desire - Suprep then [place] is recommended +1-week MiraLAX  daily plus Dulcolax every other day for that 1 week Repeat EGD for follow-up esophagitis to be scheduled at time of colonoscopy Appreciate hematology evaluation Consider G POEM or gastric pacemaker referrals in future Continue small meals throughout the day and effort of gastroparesis diet Follow-up as needed otherwise Continue PPI twice daily  No orders of the defined types were placed in this encounter.   New Prescriptions   No medications on file   Modified Medications   No medications on file    Planned Follow Up No follow-ups on file.    Total Time in Face-to-Face and in Coordination of Care for patient including independent/personal interpretation/review of prior testing, medical history, examination, medication adjustment, communicating results with the patient directly, and documentation with the EHR is 25 minutes.  Aloha Finner, MD [facility] Office # [phone]

## 2024-03-08 ENCOUNTER — Other Ambulatory Visit (HOSPITAL_COMMUNITY): Payer: Self-pay

## 2024-04-06 ENCOUNTER — Other Ambulatory Visit (HOSPITAL_COMMUNITY): Payer: Self-pay

## 2024-05-04 ENCOUNTER — Other Ambulatory Visit: Payer: Self-pay | Admitting: Gastroenterology

## 2024-05-04 ENCOUNTER — Other Ambulatory Visit (HOSPITAL_COMMUNITY): Payer: Self-pay

## 2024-05-04 ENCOUNTER — Other Ambulatory Visit: Payer: Self-pay

## 2024-05-05 ENCOUNTER — Other Ambulatory Visit: Payer: Self-pay

## 2024-05-05 ENCOUNTER — Other Ambulatory Visit (HOSPITAL_COMMUNITY): Payer: Self-pay

## 2024-05-05 MED ORDER — RABEPRAZOLE SODIUM 20 MG PO TBEC
20.0000 mg | DELAYED_RELEASE_TABLET | Freq: Two times a day (BID) | ORAL | 6 refills | Status: DC
  Filled 2024-05-05: qty 180, 90d supply, fill #0
  Filled 2024-05-05 – 2024-05-11 (×2): qty 60, 30d supply, fill #0
  Filled 2024-06-22: qty 60, 30d supply, fill #1

## 2024-05-10 ENCOUNTER — Other Ambulatory Visit: Payer: Self-pay

## 2024-05-11 ENCOUNTER — Other Ambulatory Visit: Payer: Self-pay

## 2024-05-11 ENCOUNTER — Other Ambulatory Visit (HOSPITAL_COMMUNITY): Payer: Self-pay

## 2024-05-18 ENCOUNTER — Inpatient Hospital Stay

## 2024-05-18 ENCOUNTER — Telehealth: Payer: Self-pay | Admitting: Internal Medicine

## 2024-05-18 NOTE — Telephone Encounter (Signed)
 RX routed to pharm d

## 2024-05-18 NOTE — Telephone Encounter (Signed)
*  STAT* If patient is at the pharmacy, call can be transferred to refill team.   1. Which medications need to be refilled? (please list name of each medication and dose if known)   propranolol  (INDERAL ) 20 MG tablet   2. Would you like to learn more about the convenience, safety, & potential cost savings by using the [facility]?   3. Are you open to using the [facility] (Type [facility]. ).  4. Which pharmacy/location (including street and city if local pharmacy) is medication to be sent to?  [place] [facility]   5. Do they need a 30 day or 90 day supply?   30 day  Patient stated he still has some medication.  Patient has appointment scheduled with Dr. Mallipeddi on  11/18.

## 2024-05-23 ENCOUNTER — Other Ambulatory Visit: Payer: Self-pay

## 2024-05-23 MED ORDER — PROPRANOLOL HCL 20 MG PO TABS
20.0000 mg | ORAL_TABLET | Freq: Three times a day (TID) | ORAL | 0 refills | Status: DC
  Filled 2024-05-23: qty 90, 30d supply, fill #0

## 2024-05-23 NOTE — Telephone Encounter (Signed)
*  STAT* If patient is at the pharmacy, call can be transferred to refill team.   1. Which medications need to be refilled? (please list name of each medication and dose if known)   propranolol  (INDERAL ) 20 MG tablet   2. Would you like to learn more about the convenience, safety, & potential cost savings by using the [facility]?   3. Are you open to using the [facility] (Type [facility]. ).  4. Which pharmacy/location (including street and city if local pharmacy) is medication to be sent to?  [place] [facility]    5. Do they need a 30 day or 90 day supply?   30 day  Patient stated he is completely out of this medication. Patient wants a call back to confirm prescription sent.  Patient has appointment scheduled with Dr. Mallipeddi on 11/18.

## 2024-05-24 ENCOUNTER — Other Ambulatory Visit (HOSPITAL_COMMUNITY): Payer: Self-pay

## 2024-05-25 ENCOUNTER — Inpatient Hospital Stay: Admitting: Physician Assistant

## 2024-06-22 ENCOUNTER — Other Ambulatory Visit (HOSPITAL_COMMUNITY): Payer: Self-pay

## 2024-07-12 ENCOUNTER — Encounter: Payer: Self-pay | Admitting: Internal Medicine

## 2024-07-12 ENCOUNTER — Inpatient Hospital Stay: Attending: Physician Assistant

## 2024-07-12 ENCOUNTER — Ambulatory Visit: Attending: Internal Medicine | Admitting: Internal Medicine

## 2024-07-12 ENCOUNTER — Other Ambulatory Visit: Payer: Self-pay

## 2024-07-12 ENCOUNTER — Other Ambulatory Visit (HOSPITAL_COMMUNITY): Payer: Self-pay

## 2024-07-12 ENCOUNTER — Other Ambulatory Visit (HOSPITAL_COMMUNITY)
Admission: RE | Admit: 2024-07-12 | Discharge: 2024-07-12 | Disposition: A | Discharge status: Home / Self Care | Source: Ambulatory Visit | Attending: Internal Medicine | Admitting: Internal Medicine

## 2024-07-12 VITALS — BP 120/82 | HR 77 | Ht 74.0 in | Wt 213.6 lb

## 2024-07-12 DIAGNOSIS — Z79899 Other long term (current) drug therapy: Secondary | ICD-10-CM | POA: Insufficient documentation

## 2024-07-12 DIAGNOSIS — I77819 Aortic ectasia, unspecified site: Secondary | ICD-10-CM

## 2024-07-12 DIAGNOSIS — I4719 Other supraventricular tachycardia: Secondary | ICD-10-CM

## 2024-07-12 DIAGNOSIS — K912 Postsurgical malabsorption, not elsewhere classified: Secondary | ICD-10-CM | POA: Insufficient documentation

## 2024-07-12 DIAGNOSIS — I252 Old myocardial infarction: Secondary | ICD-10-CM | POA: Insufficient documentation

## 2024-07-12 DIAGNOSIS — Z903 Acquired absence of stomach [part of]: Secondary | ICD-10-CM | POA: Insufficient documentation

## 2024-07-12 DIAGNOSIS — E119 Type 2 diabetes mellitus without complications: Secondary | ICD-10-CM | POA: Insufficient documentation

## 2024-07-12 DIAGNOSIS — I10 Essential (primary) hypertension: Secondary | ICD-10-CM | POA: Insufficient documentation

## 2024-07-12 DIAGNOSIS — I2510 Atherosclerotic heart disease of native coronary artery without angina pectoris: Secondary | ICD-10-CM | POA: Insufficient documentation

## 2024-07-12 DIAGNOSIS — Z794 Long term (current) use of insulin: Secondary | ICD-10-CM | POA: Insufficient documentation

## 2024-07-12 DIAGNOSIS — G4733 Obstructive sleep apnea (adult) (pediatric): Secondary | ICD-10-CM | POA: Insufficient documentation

## 2024-07-12 DIAGNOSIS — Z800 Family history of malignant neoplasm of digestive organs: Secondary | ICD-10-CM | POA: Insufficient documentation

## 2024-07-12 DIAGNOSIS — D508 Other iron deficiency anemias: Secondary | ICD-10-CM | POA: Insufficient documentation

## 2024-07-12 DIAGNOSIS — K9089 Other intestinal malabsorption: Secondary | ICD-10-CM

## 2024-07-12 LAB — BASIC METABOLIC PANEL WITH GFR
Anion gap: 9 (ref 5–15)
BUN: 10 mg/dL (ref 8–23)
CO2: 30 mmol/L (ref 22–32)
Calcium: 9 mg/dL (ref 8.9–10.3)
Chloride: 100 mmol/L (ref 98–111)
Creatinine, Ser: 0.57 mg/dL — ABNORMAL LOW (ref 0.61–1.24)
GFR, Estimated: >60 mL/min (ref >60)
Glucose, Bld: 174 mg/dL — ABNORMAL HIGH (ref 70–99)
Potassium: 4 mmol/L (ref 3.5–5.1)
Sodium: 139 mmol/L (ref 135–145)

## 2024-07-12 LAB — CBC WITH DIFFERENTIAL/PLATELET
Abs Immature Granulocytes: 0.07 K/uL (ref 0.00–0.07)
Basophils Absolute: 0 K/uL (ref 0.0–0.1)
Basophils Relative: 0 %
Eosinophils Absolute: 0.2 K/uL (ref 0.0–0.5)
Eosinophils Relative: 2 %
HCT: 48.8 % (ref 39.0–52.0)
Hemoglobin: 16.2 g/dL (ref 13.0–17.0)
Immature Granulocytes: 1 %
Lymphocytes Relative: 17 %
Lymphs Abs: 1.5 K/uL (ref 0.7–4.0)
MCH: 30.1 pg (ref 26.0–34.0)
MCHC: 33.2 g/dL (ref 30.0–36.0)
MCV: 90.7 fL (ref 80.0–100.0)
Monocytes Absolute: 0.8 K/uL (ref 0.1–1.0)
Monocytes Relative: 9 %
Neutro Abs: 6.6 K/uL (ref 1.7–7.7)
Neutrophils Relative %: 71 %
Platelets: 215 K/uL (ref 150–400)
RBC: 5.38 MIL/uL (ref 4.22–5.81)
RDW: 14 % (ref 11.5–15.5)
WBC: 9.2 K/uL (ref 4.0–10.5)
nRBC: 0 % (ref 0.0–0.2)

## 2024-07-12 LAB — IRON AND TIBC
Iron: 58 ug/dL (ref 45–182)
Saturation Ratios: 17 % — ABNORMAL LOW (ref 17.9–39.5)
TIBC: 340 ug/dL (ref 250–450)
UIBC: 282 ug/dL

## 2024-07-12 LAB — FERRITIN: Ferritin: 50 ng/mL (ref 24–336)

## 2024-07-12 MED ORDER — PROPRANOLOL HCL 20 MG PO TABS
20.0000 mg | ORAL_TABLET | Freq: Three times a day (TID) | ORAL | 3 refills | Status: AC
  Filled 2024-07-12: qty 270, 90d supply, fill #0

## 2024-07-12 MED ORDER — NITROGLYCERIN 0.4 MG SL SUBL
0.4000 mg | SUBLINGUAL_TABLET | SUBLINGUAL | 3 refills | Status: AC | PRN
  Filled 2024-07-12: qty 25, 15d supply, fill #0

## 2024-07-12 NOTE — Progress Notes (Unsigned)
 Cardiology Office Note  Date: 07/12/2024   ID: Hector Curry, DOB Aug 24, 1961, MRN [id number]  PCP:  Auston Reyes BIRCH, MD  Cardiologist:  Diannah SHAUNNA Maywood, MD Electrophysiologist:  None   Reason for Office Visit: CAD follow-up   History of Present Illness: Hector Curry is a 63 y.o. male known to have CAD s/p LAD PCI in 2022 with residual diagonal 90% stenosis on Plavix  monotherapy and normal LVEF, atrial tachycardia, DM2, HLD, gastric bypass surgery history complicated by stricturing of gastrojejunal anastomosis (s/p AX IOS cold stenting x 4), OSA on BiPAP presented to cardiology clinic for follow-up visit.  Recently underwent GI procedures.  Went well.  Does not have any angina or DOE.  He takes propranolol  for palpitations.  Did not tolerate metoprolol  previously.  No other dizziness, syncope.  Past Medical History:  Diagnosis Date   Allergic rhinitis    Allergy    Atrial tachycardia    CAD (coronary artery disease)    a. 05/08/2015 Cath: no significant CAD, LVEF nl-->Med; b. 07/2017 MV: attenuation artifact, no ischemia, EF 65%-->Low risk; c. 03/2021 NSTEMI/PCI: LM nl, LAD 80p (3.0x18 Onyx Frontier DES), D1 90 (too small for PCI), LCX nl, RCA nl.   Diabetes mellitus without complication (HCC)    Diastolic dysfunction    a. 04/2015 Echo: EF 60-65%; b. 05/2016 Echo: EF 60-65%, GrI DD; c. 07/2017 Echo: EF 55-60%, Ao root 42mm; d. 03/2019 Echo: EF 55-60%, Ao root/Asc Ao 42mm; e. 03/2021 Echo: EF 50-55%, apical HK, mod asymm basal-septal hypertrophy. Nl RV fxn. Asc Ao 44mm.   Dilated aortic root    a. 07/2017 Echo: 42mm Ao root - mildly dil; b. 03/2019 Echo: Ao root 42mm; c. 03/2021 Echo: Asc Ao 44mm.   Diverticulosis 10/03/2014   Dysrhythmia    Esophageal stricture    a.  In setting of lap band June 2022-stricture at the GJ anastomosis requiring gastrostomy tube; b. 05/2021 s/p esoph stenting (15mm); c. 07/2021 s/p esoph stenting (20mm).   Family history of premature CAD     a. father passed from MI at 45   GERD (gastroesophageal reflux disease)    GIB (gastrointestinal bleeding)    a. 07/2021 following esoph stenting.   Hemorrhoids    a. internal hemorrhoids s/p surgery 1999   History of kidney stones    History of tobacco abuse    Hyperlipidemia    Hyperplastic colon polyp 10/03/2014   a. x 2    Hypertension    Inflammatory arthritis    a. CCP antibodies & x-rays negative. Rheumatoid factor 14, felt to be crystaline over RA or psoriatic   Iron  deficiency anemia 02/05/2022   Morbid obesity (HCC)    a. s/p LAP-BAND - complicated by esoph stricture.   Myocardial infarction [facility])    NSTEMI   Nausea and vomiting 04/11/2023   OSA (obstructive sleep apnea)    a. on CPAP   Osteoarthritis    PSVT (paroxysmal supraventricular tachycardia)    a. 48 hr Holter 04/2015: NSR w/ rare PVC, short runs of narrow complex tachycardiac, possible atrial tach, longest run 7 beats, PACs noted (2% of all beats 3600 total) they did not seem to correlate w/ significant arrythmia; b. 08/2017 Event monitor: no significant arrhythmias.   Sleep apnea     Past Surgical History:  Procedure Laterality Date   BALLOON DILATION N/A 08/14/2021   Procedure: BALLOON DILATION;  Surgeon: Mansouraty, Aloha Raddle., MD;  Location: [facility];  Service: Gastroenterology;  Laterality: N/A;  BALLOON DILATION N/A 02/20/2022   Procedure: BALLOON DILATION;  Surgeon: Teressa Toribio SQUIBB, MD;  Location: [facility];  Service: Gastroenterology;  Laterality: N/A;   BALLOON DILATION N/A 03/27/2022   Procedure: BALLOON DILATION;  Surgeon: Wilhelmenia Aloha Raddle., MD;  Location: [facility] ENDOSCOPY;  Service: Gastroenterology;  Laterality: N/A;   BARIATRIC SURGERY  01/2021   lap band    BILIARY STENT PLACEMENT N/A 08/14/2021   Procedure: AXIOS STENT PLACEMENT;  Surgeon: Wilhelmenia Aloha Raddle., MD;  [facility];  Service: Gastroenterology;  Laterality: N/A;   BILIARY STENT PLACEMENT N/A 10/16/2022    Procedure: STENT PLACEMENT;  Surgeon: Wilhelmenia Aloha Raddle., MD;  Location: [facility];  Service: Gastroenterology;  Laterality: N/A;   BILIARY STENT PLACEMENT N/A 04/09/2023   Procedure: AXIOS STENT PLACEMENT;  Surgeon: Wilhelmenia Aloha Raddle., MD;  Location: [facility];  Service: Gastroenterology;  Laterality: N/A;   BIOPSY  07/08/2021   Procedure: BIOPSY;  Surgeon: Wilhelmenia Aloha Raddle., MD;  Location: [facility];  Service: Gastroenterology;;   BIOPSY  08/14/2021   Procedure: BIOPSY;  Surgeon: Wilhelmenia Aloha Raddle., MD;  Location: [facility];  Service: Gastroenterology;;   BIOPSY  10/16/2022   Procedure: BIOPSY;  Surgeon: Wilhelmenia Aloha Raddle., MD;  Location: [facility];  Service: Gastroenterology;;   BOTOX  INJECTION N/A 10/22/2023   Procedure: BOTOX  INJECTION;  Surgeon: Wilhelmenia Aloha Raddle., MD;  [facility];  Service: Gastroenterology;  Laterality: N/A;   CARDIAC CATHETERIZATION N/A 05/08/2015   Procedure: Left Heart Cath and Coronary Angiography;  Surgeon: Candyce GORMAN Reek, MD;  Location: [facility] INVASIVE CV LAB;  Service: Cardiovascular;  Laterality: N/A;   CARPAL TUNNEL RELEASE Bilateral    CHOLECYSTECTOMY     COLONOSCOPY  06/27/2005   COLONOSCOPY  10/03/2014   COLONOSCOPY WITH PROPOFOL  N/A 10/22/2023   Procedure: COLONOSCOPY WITH PROPOFOL ;  Surgeon: Wilhelmenia Aloha Raddle., MD;  [facility];  Service: Gastroenterology;  Laterality: N/A;   CORONARY STENT INTERVENTION N/A 04/15/2021   Procedure: CORONARY STENT INTERVENTION;  Surgeon: Court Dorn PARAS, MD;  Location: [facility] INVASIVE CV LAB;  Service: Cardiovascular;  Laterality: N/A;   DUODENAL STENT PLACEMENT N/A 11/18/2021   Procedure: DUODENAL STENT PLACEMENT;  Surgeon: Wilhelmenia Aloha Raddle., MD;  [facility];  Service: Gastroenterology;  Laterality: N/A;  axios placed at GJ anastamosis   DUODENAL STENT PLACEMENT  03/27/2022   Procedure: GASTRIC STENT PLACEMENT;  Surgeon: Wilhelmenia  Aloha Raddle., MD;  Location: [facility] ENDOSCOPY;  Service: Gastroenterology;;   ESOPHAGEAL DILATION  05/27/2021   Procedure: ESOPHAGEAL DILATION;  Surgeon: Wilhelmenia Aloha Raddle., MD;  Location: [facility];  Service: Gastroenterology;;   ESOPHAGEAL DILATION  01/23/2022   Procedure: ESOPHAGEAL DILATION;  Surgeon: Wilhelmenia Aloha Raddle., MD;  Location: [facility] ENDOSCOPY;  Service: Gastroenterology;;   ESOPHAGEAL DILATION  04/09/2023   Procedure: ESOPHAGEAL DILATION;  Surgeon: Wilhelmenia Aloha Raddle., MD;  Location: [facility];  Service: Gastroenterology;;   ESOPHAGEAL STENT PLACEMENT N/A 05/27/2021   Procedure: ESOPHAGEAL STENT PLACEMENT;  Surgeon: Wilhelmenia Aloha Raddle., MD;  [facility];  Service: Gastroenterology;  Laterality: N/A;   ESOPHAGOGASTRODUODENOSCOPY  06/27/2005   ESOPHAGOGASTRODUODENOSCOPY N/A 03/19/2021   Procedure: ESOPHAGOGASTRODUODENOSCOPY (EGD);  Surgeon: Stevie, Herlene Righter, MD;  Location: [facility];  Service: General;  Laterality: N/A;   ESOPHAGOGASTRODUODENOSCOPY N/A 08/16/2021   Procedure: ESOPHAGOGASTRODUODENOSCOPY (EGD);  Surgeon: Wilhelmenia Aloha Raddle., MD;  Location: [facility] ENDOSCOPY;  Service: Gastroenterology;  Laterality: N/A;   ESOPHAGOGASTRODUODENOSCOPY (EGD) WITH PROPOFOL  N/A 05/27/2021   Procedure: ESOPHAGOGASTRODUODENOSCOPY (EGD) WITH PROPOFOL ;  Surgeon: Wilhelmenia Aloha Raddle., MD;  [facility];  Service: Gastroenterology;  Laterality: N/A;   ESOPHAGOGASTRODUODENOSCOPY (EGD) WITH PROPOFOL  N/A 07/08/2021   Procedure: ESOPHAGOGASTRODUODENOSCOPY (EGD) WITH PROPOFOL ;  Surgeon: Wilhelmenia Aloha Raddle., MD;  [facility];  Service: Gastroenterology;  Laterality: N/A;  fluoro   ESOPHAGOGASTRODUODENOSCOPY (EGD) WITH PROPOFOL  N/A 08/14/2021   Procedure: ESOPHAGOGASTRODUODENOSCOPY (EGD) WITH PROPOFOL ;  Surgeon: Wilhelmenia Aloha Raddle., MD;  [facility];  Service: Gastroenterology;  Laterality: N/A;  fluoro Axios stent (20 mm)    ESOPHAGOGASTRODUODENOSCOPY (EGD) WITH PROPOFOL  N/A 10/16/2021   Procedure: ESOPHAGOGASTRODUODENOSCOPY (EGD) WITH PROPOFOL ;  Surgeon: Wilhelmenia Aloha Raddle., MD;  [facility];  Service: Gastroenterology;  Laterality: N/A;  axios stent pull fluoro   ESOPHAGOGASTRODUODENOSCOPY (EGD) WITH PROPOFOL  N/A 11/18/2021   Procedure: ESOPHAGOGASTRODUODENOSCOPY (EGD) WITH PROPOFOL ;  Surgeon: Wilhelmenia Aloha Raddle., MD;  [facility];  Service: Gastroenterology;  Laterality: N/A;   ESOPHAGOGASTRODUODENOSCOPY (EGD) WITH PROPOFOL  N/A 01/23/2022   Procedure: ESOPHAGOGASTRODUODENOSCOPY (EGD) WITH PROPOFOL ;  Surgeon: Wilhelmenia Aloha Raddle., MD;  Location: [facility] ENDOSCOPY;  Service: Gastroenterology;  Laterality: N/A;   ESOPHAGOGASTRODUODENOSCOPY (EGD) WITH PROPOFOL  N/A 02/20/2022   Procedure: ESOPHAGOGASTRODUODENOSCOPY (EGD) WITH PROPOFOL ;  Surgeon: Teressa Toribio SQUIBB, MD;  [facility];  Service: Gastroenterology;  Laterality: N/A;   ESOPHAGOGASTRODUODENOSCOPY (EGD) WITH PROPOFOL  N/A 03/27/2022   Procedure: ESOPHAGOGASTRODUODENOSCOPY (EGD) WITH PROPOFOL ;  Surgeon: Wilhelmenia Aloha Raddle., MD;  Location: [facility] ENDOSCOPY;  Service: Gastroenterology;  Laterality: N/A;   ESOPHAGOGASTRODUODENOSCOPY (EGD) WITH PROPOFOL  N/A 08/14/2022   Procedure: ESOPHAGOGASTRODUODENOSCOPY (EGD) WITH PROPOFOL ;  Surgeon: Wilhelmenia Aloha Raddle., MD;  [facility];  Service: Gastroenterology;  Laterality: N/A;   ESOPHAGOGASTRODUODENOSCOPY (EGD) WITH PROPOFOL  N/A 10/16/2022   Procedure: ESOPHAGOGASTRODUODENOSCOPY (EGD) WITH PROPOFOL ;  Surgeon: Wilhelmenia Aloha Raddle., MD;  [facility];  Service: Gastroenterology;  Laterality: N/A;   ESOPHAGOGASTRODUODENOSCOPY (EGD) WITH PROPOFOL  N/A 11/28/2022   Procedure: ESOPHAGOGASTRODUODENOSCOPY (EGD) WITH PROPOFOL  WITH AXIOS STENT REMOVAL;  Surgeon: Wilhelmenia Aloha Raddle., MD;  Location: [facility] ENDOSCOPY;  Service: Gastroenterology;  Laterality: N/A;    ESOPHAGOGASTRODUODENOSCOPY (EGD) WITH PROPOFOL  N/A 04/09/2023   Procedure: ESOPHAGOGASTRODUODENOSCOPY (EGD) WITH PROPOFOL ;  Surgeon: Wilhelmenia Aloha Raddle., MD;  [facility];  Service: Gastroenterology;  Laterality: N/A;  dilation - axios stent   ESOPHAGOGASTRODUODENOSCOPY (EGD) WITH PROPOFOL  N/A 04/11/2023   Procedure: ESOPHAGOGASTRODUODENOSCOPY (EGD) WITH PROPOFOL ;  Surgeon: Albertus Gordy HERO, MD;  [facility];  Service: Gastroenterology;  Laterality: N/A;   ESOPHAGOGASTRODUODENOSCOPY (EGD) WITH PROPOFOL  N/A 04/30/2023   Procedure: ESOPHAGOGASTRODUODENOSCOPY (EGD) WITH PROPOFOL ;  Surgeon: Wilhelmenia Aloha Raddle., MD;  [facility];  Service: Gastroenterology;  Laterality: N/A;  axios stent pull   ESOPHAGOGASTRODUODENOSCOPY (EGD) WITH PROPOFOL  N/A 10/22/2023   Procedure: ESOPHAGOGASTRODUODENOSCOPY (EGD) WITH PROPOFOL ;  Surgeon: Wilhelmenia Aloha Raddle., MD;  Location[facility];  Service: Gastroenterology;  Laterality: N/A;   FLEXIBLE SIGMOIDOSCOPY N/A 08/16/2021   Procedure: FLEXIBLE SIGMOIDOSCOPY;  Surgeon: Wilhelmenia Aloha Raddle., MD;  Location: [facility] ENDOSCOPY;  Service: Gastroenterology;  Laterality: N/A;   FOREIGN BODY REMOVAL  02/20/2022   Procedure: FOREIGN BODY REMOVAL;  Surgeon: Teressa Toribio SQUIBB, MD;  Location: [facility];  Service: Gastroenterology;;   FOREIGN BODY REMOVAL ESOPHAGEAL  03/27/2022   Procedure: REMOVAL FOREIGN BODY;  Surgeon: Wilhelmenia Aloha Raddle., MD;  Location: [facility] ENDOSCOPY;  Service: Gastroenterology;;   GASTRIC ROUX-EN-Y N/A 02/18/2021   Procedure: LAPAROSCOPIC ROUX-EN-Y GASTRIC BYPASS WITH UPPER ENDOSCOPY,;  Surgeon: Stevie Herlene Righter, MD;  Location: [facility];  Service: General;  Laterality: N/A;   GASTRIC ROUX-EN-Y N/A 12/02/2022   Procedure: LAPAROSCOPIC converted to OPEN GASTRIC BYPASS REVERSAL, TAKEDOWN  OF GASTROJEJUNAL ANASTOMOSIS, GASTRO GASTRIC ANASTOMOSIS WITH UPPER ENDOSCOPY 1. Laparoscopic lysis of adhesions 2. Partial gastrectomy  3. Small bowel resection 4. Creation of esophagus to gastric anastomosis 5. Creation of gastrostomy;  Surgeon: Kinsinger, Herlene Righter, MD;  Location: [facility];  Service: General;  Laterality:    GASTROINTESTINAL STENT REMOVAL  01/23/2022   Procedure: JUSTINA CARLS REMOVAL;  Surgeon: Wilhelmenia Aloha Raddle., MD;  Location: [facility] ENDOSCOPY;  Service: Gastroenterology;;   GASTROINTESTINAL STENT REMOVAL N/A 04/30/2023   Procedure: GASTROINTESTINAL STENT REMOVAL;  Surgeon: Wilhelmenia Aloha Raddle., MD;  [facility]: [facility];  Service: Gastroenterology;  Laterality: N/A;   GASTROSTOMY N/A 12/02/2022   Procedure: INSERTION OF GASTROSTOMY TUBE;  Surgeon: Stevie Herlene Righter, MD;  Location: [facility];  Service: General;  Laterality: N/A;   HEMORRHOID SURGERY  08/25/1997   IR GJ TUBE CHANGE  03/27/2021   LAPAROSCOPIC INSERTION GASTROSTOMY TUBE N/A 03/20/2021   Procedure: LAPAROSCOPIC INSERTION GASTROSTOMY TUBE;  Surgeon: Stevie Herlene Righter, MD;  Location: [facility];  Service: General;  Laterality: N/A;   LEFT HEART CATH AND CORONARY ANGIOGRAPHY N/A 04/15/2021   Procedure: LEFT HEART CATH AND CORONARY ANGIOGRAPHY;  Surgeon: Court Dorn PARAS, MD;  Location: [facility] INVASIVE CV LAB;  Service: Cardiovascular;  Laterality: N/A;   MOUTH SURGERY     removed area which was benign   SCLEROTHERAPY  04/30/2023   Procedure: SCLEROTHERAPY;  Surgeon: Mansouraty, Aloha Raddle., MD;  Location: [facility];  Service: Gastroenterology;;   CARLS REMOVAL  07/08/2021   Procedure: STENT REMOVAL;  Surgeon: Wilhelmenia Aloha Raddle., MD;  Location: [facility];  Service: Gastroenterology;;   CARLS REMOVAL  10/16/2021   Procedure: STENT REMOVAL;  Surgeon: Wilhelmenia Aloha Raddle., MD;  Location: [facility];  Service: Gastroenterology;;   CARLS REMOVAL  08/14/2022   Procedure: STENT REMOVAL;  Surgeon: Wilhelmenia Aloha Raddle., MD;  Location: [facility];  Service: Gastroenterology;;   CARLS REMOVAL  11/28/2022   Procedure: STENT REMOVAL;  Surgeon:  Wilhelmenia Aloha Raddle., MD;  Location: [facility] ENDOSCOPY;  Service: Gastroenterology;;   CARLS REMOVAL  04/11/2023   Procedure: STENT REMOVAL;  Surgeon: Albertus Gordy HERO, MD;  [facility];  Service: Gastroenterology;;   STERIOD INJECTION  01/23/2022   Procedure: STEROID INJECTION;  Surgeon: Wilhelmenia Aloha Raddle., MD;  Location: [facility] ENDOSCOPY;  Service: Gastroenterology;;   SUBMUCOSAL INJECTION  11/18/2021   Procedure: SUBMUCOSAL INJECTION;  Surgeon: Wilhelmenia Aloha Raddle., MD;  Location: [facility];  Service: Gastroenterology;;   SUBMUCOSAL INJECTION  08/14/2022   Procedure: SUBMUCOSAL INJECTION;  Surgeon: Wilhelmenia Aloha Raddle., MD;  Location: [facility];  Service: Gastroenterology;;   TONSILLECTOMY     UPPER GI ENDOSCOPY N/A 03/20/2021   Procedure: UPPER GI ENDOSCOPY;  Surgeon: Kinsinger, Herlene Righter, MD;  Location: [facility];  Service: General;  Laterality: N/A;    Current Outpatient Medications  Medication Sig Dispense Refill   cetirizine  (ZYRTEC ) 10 MG tablet Take by mouth.     clopidogrel  (PLAVIX ) 75 MG tablet Take 1 tablet (75 mg total) by mouth daily. 90 tablet 2   Continuous Glucose Sensor (FREESTYLE LIBRE 14 DAY SENSOR) MISC Insert 1 sensor to the back of the arm as directed every 14 days for continuous glucose monitoring 6 each 1   doxycycline  (VIBRAMYCIN ) 100 MG capsule Take 1 capsule (100 mg total) by mouth 2 (two) times daily for 7 days 14 capsule 0   ergocalciferol  (VITAMIN D2) 1.25 MG (50000 UT) capsule Take 1 capsule (50,000 Units total) by mouth once a week. 8 capsule 0  fluticasone  (FLONASE ) 50 MCG/ACT nasal spray Place 2-3 sprays into both nostrils daily as needed for allergies or rhinitis.     insulin  lispro (HUMALOG ) 100 UNIT/ML KwikPen Inject subcutaneously 3 (three) times daily with meals Per sliding scale (Patient taking differently: Inject 3-7 Units into the skin daily as needed (For high blood sugar).) 15 mL 1   Insulin  Pen Needle (UNIFINE PENTIPS) 32G X 4 MM  MISC Use as needed as directed 100 each 3   methimazole  (TAPAZOLE ) 5 MG tablet Take 1 tablet (5 mg total) by mouth once daily 30 tablet 6   metoCLOPramide  (REGLAN ) 5 MG tablet Take 1 tablet (5 mg total) by mouth at bedtime. (Patient not taking: Reported on 03/04/2024) 60 tablet 1   mometasone  (ELOCON ) 0.1 % lotion Apply to affected areas once daily (Patient taking differently: Apply 1 Application topically as needed (psoriasis).) 60 mL 5   Multiple Vitamin (MULTI-VITAMIN DAILY PO) Take 1 patch by mouth daily.     nitroGLYCERIN  (NITROSTAT ) 0.4 MG SL tablet Place 1 tablet (0.4 mg total) under the tongue every 5 (five) minutes as needed for chest pain. (Patient not taking: Reported on 09/02/2023) 25 tablet 3   ondansetron  (ZOFRAN -ODT) 4 MG disintegrating tablet Take 1 tablet (4 mg total) by mouth every 6 (six) hours as needed for nausea or vomiting. 20 tablet 0   OVER THE COUNTER MEDICATION Apply 1 Application topically daily at 6 (six) AM. Medication: Bariatric patch     prochlorperazine  (COMPAZINE ) 25 MG suppository Place 1 suppository (25 mg total) rectally every 12 (twelve) hours as needed for nausea or vomiting. 6 suppository 0   prochlorperazine  (COMPAZINE ) 5 MG tablet Take 1 tablet (5 mg total) by mouth every 6 (six) hours as needed for nausea or vomiting. 30 tablet 0   propranolol  (INDERAL ) 20 MG tablet Take 1 tablet (20 mg total) by mouth 3 (three) times daily. 90 tablet 0   propranolol  40 MG/5ML solution Take 5 mg by mouth every evening. As needed     Propylene Glycol (SYSTANE BALANCE OP) Place 2-3 drops into both eyes daily as needed (dry eyes).     Pseudoeph-Doxylamine-DM-APAP (NYQUIL PO) Take 2 tablets by mouth at bedtime as needed (pain).     RABEprazole  (ACIPHEX ) 20 MG tablet Take 1 tablet (20 mg total) by mouth in the morning and at bedtime. 60 tablet 6   senna (SENOKOT) 8.6 MG tablet Take 1 tablet by mouth 2 (two) times daily.     VITAMIN E PO Take 1 tablet by mouth daily.     No current  facility-administered medications for this visit.   Allergies:  Other   Social History: The patient  reports that he quit smoking about 34 years ago. His smoking use included cigarettes. He started smoking about 49 years ago. He has a 15 pack-year smoking history. He has never used smokeless tobacco. He reports that he does not currently use alcohol. He reports that he does not use drugs.   Family History: The patient's family history includes CAD in his father; Diabetes in his mother and another family member; Heart attack (age of onset: 43) in his father; Heart disease in an other family member; Hypertension in his mother, sister, and another family member.   ROS:  Please see the history of present illness. Otherwise, complete review of systems is positive for none.  All other systems are reviewed and negative.   Physical Exam: VS:  There were no vitals taken for this visit., BMI There  is no height or weight on file to calculate BMI.  Wt Readings from Last 3 Encounters:  03/04/24 202 lb 6 oz (91.8 kg)  10/22/23 174 lb (78.9 kg)  09/02/23 177 lb (80.3 kg)    General: Patient appears comfortable at rest. HEENT: Conjunctiva and lids normal, oropharynx clear with moist mucosa. Neck: Supple, no elevated JVP or carotid bruits, no thyromegaly. Lungs: Clear to auscultation, nonlabored breathing at rest. Cardiac: Regular rate and rhythm, no S3 or significant systolic murmur, no pericardial rub. Abdomen: Soft, nontender, no hepatomegaly, bowel sounds present, no guarding or rebound. Extremities: No pitting edema, distal pulses 2+. Skin: Warm and dry. Musculoskeletal: No kyphosis. Neuropsychiatric: Alert and oriented x3, affect grossly appropriate.  ECG:  An ECG dated 09/26/2022 was personally reviewed today and demonstrated:  NSR and RBBB  Recent Labwork: 07/12/2024: Hemoglobin 16.2; Platelets 215     Component Value Date/Time   CHOL 156 06/29/2023 0802   TRIG 84 06/29/2023 0802   HDL 37  (L) 06/29/2023 0802   CHOLHDL 4.2 06/29/2023 0802   VLDL 17 06/29/2023 0802   LDLCALC 102 (H) 06/29/2023 0802     Assessment and Plan  # CAD s/p LAD PCI in 2022 with residual diagonal 90% stenosis on Plavix  monotherapy and normal LVEF, currently angina free - Continue Plavix  75 mg once daily. - Unclear why he is not on statin.  Previously was on atorvastatin  10 mg nightly.  Will refill. - Will refill SL NTG 0.4 mg. - ER precautions for chest pain provided.  # HLD, not at goal - Unclear why he is not on statin.  Previously was on atorvastatin  10 mg nightly.  Will refill.  # Ascending aorta dilatation, 44 mm per echo from 2022 -CTA chest/aorta from 03/2023 at [facility] showed 41 mm aorta dilatation which could be normal for his height.  Obtain CT angio chest/aorta.  # Atrial tachycardia with breakthrough palpitations -Did not tolerate metoprolol , propranolol  seemed to help his palpitations.  Currently takes propranolol  only at nighttime.  Elevated heart rates in the day does not bother him.  30-minutes spent in reviewing prior medical records, more than 3 labs, discussion and documentation.   Disposition:  Follow up 1 year Signed, Basha Krygier Arleta Maywood, MD, 07/12/2024 9:23 AM    [facility] at [facility] [address], [place], [place] [address]

## 2024-07-12 NOTE — Patient Instructions (Signed)
 Medication Instructions:  Your physician recommends that you continue on your current medications as directed. Please refer to the Current Medication list given to you today.  *If you need a refill on your cardiac medications before your next appointment, please call your pharmacy*  Lab Work: BMET  If you have labs (blood work) drawn today and your tests are completely normal, you will receive your results only by: MyChart Message (if you have MyChart) OR A paper copy in the mail If you have any lab test that is abnormal or we need to change your treatment, we will call you to review the results.  Testing/Procedures: Cardiac CT- Chest/Aorta  Follow-Up: At [facility], you and your health needs are our priority.  As part of our continuing mission to provide you with exceptional heart care, our providers are all part of one team.  This team includes your primary Cardiologist (physician) and Advanced Practice Providers or APPs (Physician Assistants and Nurse Practitioners) who all work together to provide you with the care you need, when you need it.  Your next appointment:   1 year(s)  Provider:   You may see Vishnu P Mallipeddi, MD or one of the following Advanced Practice Providers on your designated Care Team:   Brittany Strader, PA-C  Scotesia Lovilia, [place] Olivia Pavy, [place]     We recommend signing up for the patient portal called MyChart.  Sign up information is provided on this After Visit Summary.  MyChart is used to connect with patients for Virtual Visits (Telemedicine).  Patients are able to view lab/test results, encounter notes, upcoming appointments, etc.  Non-urgent messages can be sent to your provider as well.   To learn more about what you can do with MyChart, go to [email or web address].   Other Instructions

## 2024-07-18 NOTE — Progress Notes (Unsigned)
 [facility] [address], [place] [address]   CLINIC:  Medical Oncology/Hematology  PCP:  Auston Reyes BIRCH, MD [address] [facility] [place] [address] [phone]   REASON FOR VISIT:  Follow-up for iron  deficiency anemia   PRIOR THERAPY: Oral iron  tablets   CURRENT THERAPY: Intermittent IV iron    INTERVAL HISTORY:   Hector Curry 63 y.o. male returns for routine follow-up of  iron  deficiency anemia.   He was last seen by Pleasant Barefoot PA-C on 11/17/2023.   His most recent IV iron  was Venofer  400 mg on 09/04/2023.  At today's visit, he reports feeling fairly well.  *** ***He has 100***% energy and 50***% appetite.  ***He endorses that he is maintaining a stable weight.   ***He has not had any recurrent fatigue, lightheadedness, headaches. ***No pica, chest pain, or dyspnea on exertion. ***Energy is ***. ***He is still working to get his appetite and taste back after his multiple GI procedures. ***He denies any bright red blood per rectum or melena.    ***He has chronic palpitations, on propranolol . ***He continues to take daily iron  tablet.  ASSESSMENT & PLAN:  1.   Iron  deficiency anemia - Seen  at the request of his primary care provider (Dr. Reyes Auston) for evaluation and treatment of iron  deficiency anemia with IV iron  infusions requested. 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  During hospitalization, Hgb dropped to 7.8, transfused 1 unit PRBC and IV iron  (Ferrlecit 250 mg) - S/p partial gastrectomy and esophagectomy with EG anastomosis, G-tube, and small bowel resection in April 2024 - Most recent EGD (04/30/2023): Esophagogastric anastomosis stricture.   4 cm hiatal hernia.  No bleeding noted. - Hematology work-up (02/05/2022): Normal B12, folate, copper , methylmalonic acid, homocystine. Normal LDH, reticulocytes, SPEP. - He is taking daily iron  tablet.***   Also taking Plavix , Protonix , and Carafate . - Most recent IV iron  with Venofer  400 mg on 09/04/2023. - Most recent labs (07/12/2024): Hgb 16.2, ferritin 50, iron  saturation 17%.  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 - PLAN: *** IV iron  x 1 with RTC in 1 year?***Or no IV iron  and recheck in 6 months*** - *** TBD.  *** Repeat CBC/iron  panel and RTC in 6 months - *** TBD.  *** Patient can continue to take oral iron  supplement ONCE daily dosing, at least 2 hours separate from his PPI and Carafate    2.  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  He lives at home with his wife.  He has a remote history of tobacco use, smoked 1 PPD cigarettes for 9 years, but quit in 1991.  He denies any alcohol or illicit drug use. -- FAMILY:  Maternal grandfather with pancreatic cancer.  Patient's sister also has iron  deficiency anemia secondary  to history of gastric bypass surgery    PLAN SUMMARY: *** TBD *** >> Labs in 6 months = CBC/D, ferritin, iron /TIBC >> OFFICE visit in 6 months (1 week after labs)     REVIEW OF SYSTEMS: ***  Review of Systems  Constitutional:  Positive for appetite change. Negative for chills, diaphoresis, fatigue,  fever and unexpected weight change.  HENT:   Negative for lump/mass and nosebleeds.   Eyes:  Negative for eye problems.  Respiratory:  Negative for cough, hemoptysis and shortness of breath.   Cardiovascular:  Negative for chest pain, leg swelling and palpitations.  Gastrointestinal:  Negative for abdominal pain, blood in stool, constipation, diarrhea, nausea and vomiting.  Genitourinary:  Negative for hematuria.   Skin: Negative.   Neurological:  Negative for dizziness, headaches and light-headedness.  Hematological:  Does not bruise/bleed easily.     PHYSICAL EXAM:***  ECOG PERFORMANCE STATUS: 0 - Asymptomatic  There were no vitals filed for this visit.   Physical Exam Constitutional:      Appearance: Normal appearance. He is normal weight.  Cardiovascular:     Heart sounds: Normal heart sounds.  Pulmonary:     Breath sounds: Normal breath sounds.  Neurological:     General: No focal deficit present.     Mental Status: Mental status is at baseline.  Psychiatric:        Behavior: Behavior normal. Behavior is cooperative.    PAST MEDICAL/SURGICAL HISTORY:  Past Medical History:  Diagnosis Date   Allergic rhinitis    Allergy    Atrial tachycardia    CAD (coronary artery disease)    a. 05/08/2015 Cath: no significant CAD, LVEF nl-->Med; b. 07/2017 MV: attenuation artifact, no ischemia, EF 65%-->Low risk; c. 03/2021 NSTEMI/PCI: LM nl, LAD 80p (3.0x18 Onyx Frontier DES), D1 90 (too small for PCI), LCX nl, RCA nl.   Diabetes mellitus without complication (HCC)    Diastolic dysfunction    a. 04/2015 Echo: EF 60-65%; b. 05/2016 Echo: EF 60-65%, GrI DD; c. 07/2017 Echo: EF 55-60%, Ao root 42mm; d. 03/2019 Echo: EF 55-60%, Ao root/Asc Ao 42mm; e. 03/2021 Echo: EF 50-55%, apical HK, mod asymm basal-septal hypertrophy. Nl RV fxn. Asc Ao 44mm.   Dilated aortic root    a. 07/2017 Echo: 42mm Ao root - mildly dil; b. 03/2019 Echo: Ao root 42mm; c. 03/2021 Echo: Asc Ao 44mm.   Diverticulosis  10/03/2014   Dysrhythmia    Esophageal stricture    a.  In setting of lap band June 2022-stricture at the GJ anastomosis requiring gastrostomy tube; b. 05/2021 s/p esoph stenting (15mm); c. 07/2021 s/p esoph stenting (20mm).   Family history of premature CAD    a. father passed from MI at 67   GERD (gastroesophageal reflux disease)    GIB (gastrointestinal bleeding)    a. 07/2021 following esoph stenting.   Hemorrhoids    a. internal hemorrhoids s/p surgery 1999   History of kidney stones    History of tobacco abuse    Hyperlipidemia    Hyperplastic colon polyp 10/03/2014   a. x 2    Hypertension    Inflammatory arthritis    a. CCP antibodies & x-rays negative. Rheumatoid factor 14, felt to be crystaline over RA or psoriatic   Iron  deficiency anemia 02/05/2022   Morbid obesity (HCC)    a. s/p LAP-BAND - complicated by esoph stricture.   Myocardial infarction [facility])    NSTEMI   Nausea and vomiting 04/11/2023   OSA (obstructive  sleep apnea)    a. on CPAP   Osteoarthritis    PSVT (paroxysmal supraventricular tachycardia)    a. 48 hr Holter 04/2015: NSR w/ rare PVC, short runs of narrow complex tachycardiac, possible atrial tach, longest run 7 beats, PACs noted (2% of all beats 3600 total) they did not seem to correlate w/ significant arrythmia; b. 08/2017 Event monitor: no significant arrhythmias.   Sleep apnea    Past Surgical History:  Procedure Laterality Date   BALLOON DILATION N/A 08/14/2021   Procedure: BALLOON DILATION;  Surgeon: Mansouraty, Aloha Raddle., MD;  Location: [facility];  Service: Gastroenterology;  Laterality: N/A;   BALLOON DILATION N/A 02/20/2022   Procedure: BALLOON DILATION;  Surgeon: Teressa Toribio SQUIBB, MD;  Location: [facility];  Service: Gastroenterology;  Laterality: N/A;   BALLOON DILATION N/A 03/27/2022   Procedure: BALLOON DILATION;  Surgeon: Wilhelmenia Aloha Raddle., MD;  Location: [facility]OSCOPY;  Service: Gastroenterology;  Laterality: N/A;   BARIATRIC  SURGERY  01/2021   lap band    BILIARY STENT PLACEMENT N/A 08/14/2021   Procedure: AXIOS STENT PLACEMENT;  Surgeon: Wilhelmenia Aloha Raddle., MD;  [facility]: [facility];  Service: Gastroenterology;  Laterality: N/A;   BILIARY STENT PLACEMENT N/A 10/16/2022   Procedure: STENT PLACEMENT;  Surgeon: Wilhelmenia Aloha Raddle., MD;  Location: [facility];  Service: Gastroenterology;  Laterality: N/A;   BILIARY STENT PLACEMENT N/A 04/09/2023   Procedure: AXIOS STENT PLACEMENT;  Surgeon: Wilhelmenia Aloha Raddle., MD;  Location: [facility];  Service: Gastroenterology;  Laterality: N/A;   BIOPSY  07/08/2021   Procedure: BIOPSY;  Surgeon: Wilhelmenia Aloha Raddle., MD;  Location: [facility];  Service: Gastroenterology;;   BIOPSY  08/14/2021   Procedure: BIOPSY;  Surgeon: Wilhelmenia Aloha Raddle., MD;  Location: [facility];  Service: Gastroenterology;;   BIOPSY  10/16/2022   Procedure: BIOPSY;  Surgeon: Wilhelmenia Aloha Raddle., MD;  Location: [facility];  Service: Gastroenterology;;   BOTOX  INJECTION N/A 10/22/2023   Procedure: BOTOX  INJECTION;  Surgeon: Wilhelmenia Aloha Raddle., MD;  [facility];  Service: Gastroenterology;  Laterality: N/A;   CARDIAC CATHETERIZATION N/A 05/08/2015   Procedure: Left Heart Cath and Coronary Angiography;  Surgeon: Candyce GORMAN Reek, MD;  Location: [facility] INVASIVE CV LAB;  Service: Cardiovascular;  Laterality: N/A;   CARPAL TUNNEL RELEASE Bilateral    CHOLECYSTECTOMY     COLONOSCOPY  06/27/2005   COLONOSCOPY  10/03/2014   COLONOSCOPY WITH PROPOFOL  N/A 10/22/2023   Procedure: COLONOSCOPY WITH PROPOFOL ;  Surgeon: Wilhelmenia Aloha Raddle., MD;  [facility]: [facility];  Service: Gastroenterology;  Laterality: N/A;   CORONARY STENT INTERVENTION N/A 04/15/2021   Procedure: CORONARY STENT INTERVENTION;  Surgeon: Court Dorn PARAS, MD;  Location: [facility] INVASIVE CV LAB;  Service: Cardiovascular;  Laterality: N/A;   DUODENAL STENT PLACEMENT N/A 11/18/2021   Procedure:  DUODENAL STENT PLACEMENT;  Surgeon: Wilhelmenia Aloha Raddle., MD;  [facility];  Service: Gastroenterology;  Laterality: N/A;  axios placed at GJ anastamosis   DUODENAL STENT PLACEMENT  03/27/2022   Procedure: GASTRIC STENT PLACEMENT;  Surgeon: Wilhelmenia Aloha Raddle., MD;  Location: [facility] ENDOSCOPY;  Service: Gastroenterology;;   ESOPHAGEAL DILATION  05/27/2021   Procedure: ESOPHAGEAL DILATION;  Surgeon: Wilhelmenia Aloha Raddle., MD;  Location: [facility];  Service: Gastroenterology;;   ESOPHAGEAL DILATION  01/23/2022   Procedure: ESOPHAGEAL DILATION;  Surgeon: Wilhelmenia Aloha Raddle., MD;  Location: [facility] ENDOSCOPY;  Service: Gastroenterology;;   ESOPHAGEAL DILATION  04/09/2023   Procedure: ESOPHAGEAL DILATION;  Surgeon: Wilhelmenia Aloha Raddle., MD;  [facility];  Service: Gastroenterology;;   ESOPHAGEAL STENT PLACEMENT N/A 05/27/2021   Procedure: ESOPHAGEAL STENT PLACEMENT;  Surgeon: Wilhelmenia Aloha Raddle., MD;  Location: [facility];  Service: Gastroenterology;  Laterality: N/A;   ESOPHAGOGASTRODUODENOSCOPY  06/27/2005   ESOPHAGOGASTRODUODENOSCOPY N/A 03/19/2021   Procedure: ESOPHAGOGASTRODUODENOSCOPY (EGD);  Surgeon: Stevie, Herlene Righter, MD;  Location: [facility];  Service: General;  Laterality: N/A;   ESOPHAGOGASTRODUODENOSCOPY N/A 08/16/2021   Procedure: ESOPHAGOGASTRODUODENOSCOPY (EGD);  Surgeon: Wilhelmenia Aloha Raddle., MD;  Location: [facility] ENDOSCOPY;  Service: Gastroenterology;  Laterality: N/A;   ESOPHAGOGASTRODUODENOSCOPY (EGD) WITH PROPOFOL  N/A 05/27/2021   Procedure: ESOPHAGOGASTRODUODENOSCOPY (EGD) WITH PROPOFOL ;  Surgeon: Wilhelmenia Aloha Raddle., MD;  [facility];  Service: Gastroenterology;  Laterality: N/A;   ESOPHAGOGASTRODUODENOSCOPY (EGD) WITH PROPOFOL  N/A 07/08/2021   Procedure: ESOPHAGOGASTRODUODENOSCOPY (EGD) WITH PROPOFOL ;  Surgeon: Wilhelmenia Aloha Raddle., MD;  [facility];  Service: Gastroenterology;  Laterality: N/A;  fluoro    ESOPHAGOGASTRODUODENOSCOPY (EGD) WITH PROPOFOL  N/A 08/14/2021   Procedure: ESOPHAGOGASTRODUODENOSCOPY (EGD) WITH PROPOFOL ;  Surgeon: Wilhelmenia Aloha Raddle., MD;  [facility];  Service: Gastroenterology;  Laterality: N/A;  fluoro Axios stent (20 mm)   ESOPHAGOGASTRODUODENOSCOPY (EGD) WITH PROPOFOL  N/A 10/16/2021   Procedure: ESOPHAGOGASTRODUODENOSCOPY (EGD) WITH PROPOFOL ;  Surgeon: Wilhelmenia Aloha Raddle., MD;  [facility];  Service: Gastroenterology;  Laterality: N/A;  axios stent pull fluoro   ESOPHAGOGASTRODUODENOSCOPY (EGD) WITH PROPOFOL  N/A 11/18/2021   Procedure: ESOPHAGOGASTRODUODENOSCOPY (EGD) WITH PROPOFOL ;  Surgeon: Wilhelmenia Aloha Raddle., MD;  [facility];  Service: Gastroenterology;  Laterality: N/A;   ESOPHAGOGASTRODUODENOSCOPY (EGD) WITH PROPOFOL  N/A 01/23/2022   Procedure: ESOPHAGOGASTRODUODENOSCOPY (EGD) WITH PROPOFOL ;  Surgeon: Wilhelmenia Aloha Raddle., MD;  Location: [facility] ENDOSCOPY;  Service: Gastroenterology;  Laterality: N/A;   ESOPHAGOGASTRODUODENOSCOPY (EGD) WITH PROPOFOL  N/A 02/20/2022   Procedure: ESOPHAGOGASTRODUODENOSCOPY (EGD) WITH PROPOFOL ;  Surgeon: Teressa Toribio SQUIBB, MD;  [facility];  Service: Gastroenterology;  Laterality: N/A;   ESOPHAGOGASTRODUODENOSCOPY (EGD) WITH PROPOFOL  N/A 03/27/2022   Procedure: ESOPHAGOGASTRODUODENOSCOPY (EGD) WITH PROPOFOL ;  Surgeon: Wilhelmenia Aloha Raddle., MD;  Location: [facility] ENDOSCOPY;  Service: Gastroenterology;  Laterality: N/A;   ESOPHAGOGASTRODUODENOSCOPY (EGD) WITH PROPOFOL  N/A 08/14/2022   Procedure: ESOPHAGOGASTRODUODENOSCOPY (EGD) WITH PROPOFOL ;  Surgeon: Wilhelmenia Aloha Raddle., MD;  [facility];  Service: Gastroenterology;  Laterality: N/A;   ESOPHAGOGASTRODUODENOSCOPY (EGD) WITH PROPOFOL  N/A 10/16/2022   Procedure: ESOPHAGOGASTRODUODENOSCOPY (EGD) WITH PROPOFOL ;  Surgeon: Wilhelmenia Aloha Raddle., MD;  [facility];  Service: Gastroenterology;  Laterality: N/A;    ESOPHAGOGASTRODUODENOSCOPY (EGD) WITH PROPOFOL  N/A 11/28/2022   Procedure: ESOPHAGOGASTRODUODENOSCOPY (EGD) WITH PROPOFOL  WITH AXIOS STENT REMOVAL;  Surgeon: Wilhelmenia Aloha Raddle., MD;  Location: [facility] ENDOSCOPY;  Service: Gastroenterology;  Laterality: N/A;   ESOPHAGOGASTRODUODENOSCOPY (EGD) WITH PROPOFOL  N/A 04/09/2023   Procedure: ESOPHAGOGASTRODUODENOSCOPY (EGD) WITH PROPOFOL ;  Surgeon: Wilhelmenia Aloha Raddle., MD;  Location: [facility];  Service: Gastroenterology;  Laterality: N/A;  dilation - axios stent   ESOPHAGOGASTRODUODENOSCOPY (EGD) WITH PROPOFOL  N/A 04/11/2023   Procedure: ESOPHAGOGASTRODUODENOSCOPY (EGD) WITH PROPOFOL ;  Surgeon: Albertus Gordy HERO, MD;  Location: [facility];  Service: Gastroenterology;  Laterality: N/A;   ESOPHAGOGASTRODUODENOSCOPY (EGD) WITH PROPOFOL  N/A 04/30/2023   Procedure: ESOPHAGOGASTRODUODENOSCOPY (EGD) WITH PROPOFOL ;  Surgeon: Wilhelmenia Aloha Raddle., MD;  Location: [facility];  Service: Gastroenterology;  Laterality: N/A;  axios stent pull   ESOPHAGOGASTRODUODENOSCOPY (EGD) WITH PROPOFOL  N/A 10/22/2023   Procedure: ESOPHAGOGASTRODUODENOSCOPY (EGD) WITH PROPOFOL ;  Surgeon: Wilhelmenia Aloha Raddle., MD;  Location: [facility];  Service: Gastroenterology;  Laterality: N/A;   FLEXIBLE SIGMOIDOSCOPY N/A 08/16/2021   Procedure: FLEXIBLE SIGMOIDOSCOPY;  Surgeon: Wilhelmenia Aloha Raddle., MD;  Location:  [facility];  Service: Gastroenterology;  Laterality: N/A;   FOREIGN BODY REMOVAL  02/20/2022   Procedure: FOREIGN BODY REMOVAL;  Surgeon: Teressa Toribio SQUIBB, MD;  Location: [facility];  Service: Gastroenterology;;   FOREIGN BODY REMOVAL ESOPHAGEAL  03/27/2022   Procedure: REMOVAL FOREIGN BODY;  Surgeon: Wilhelmenia Aloha Raddle., MD;  Location: [facility] ENDOSCOPY;  Service: Gastroenterology;;   GASTRIC ROUX-EN-Y N/A 02/18/2021   Procedure: LAPAROSCOPIC ROUX-EN-Y GASTRIC BYPASS WITH UPPER ENDOSCOPY,;  Surgeon: Stevie Herlene Righter, MD;  Location: [facility];  Service: General;   Laterality: N/A;   GASTRIC ROUX-EN-Y N/A 12/02/2022   Procedure: LAPAROSCOPIC converted to OPEN GASTRIC BYPASS REVERSAL, TAKEDOWN OF GASTROJEJUNAL ANASTOMOSIS, GASTRO GASTRIC ANASTOMOSIS WITH UPPER ENDOSCOPY 1. Laparoscopic lysis of adhesions 2. Partial gastrectomy 3. Small bowel resection 4. Creation of esophagus to gastric anastomosis 5. Creation of gastrostomy;  Surgeon: Kinsinger, Herlene Righter, MD;  Location: [facility];  Service: General;  Laterality:    GASTROINTESTINAL STENT REMOVAL  01/23/2022   Procedure: JUSTINA CARLS REMOVAL;  Surgeon: Wilhelmenia Aloha Raddle., MD;  Location: [facility] ENDOSCOPY;  Service: Gastroenterology;;   GASTROINTESTINAL STENT REMOVAL N/A 04/30/2023   Procedure: GASTROINTESTINAL STENT REMOVAL;  Surgeon: Wilhelmenia Aloha Raddle., MD;  [facility]: [facility];  Service: Gastroenterology;  Laterality: N/A;   GASTROSTOMY N/A 12/02/2022   Procedure: INSERTION OF GASTROSTOMY TUBE;  Surgeon: Stevie Herlene Righter, MD;  Location: [facility];  Service: General;  Laterality: N/A;   HEMORRHOID SURGERY  08/25/1997   IR GJ TUBE CHANGE  03/27/2021   LAPAROSCOPIC INSERTION GASTROSTOMY TUBE N/A 03/20/2021   Procedure: LAPAROSCOPIC INSERTION GASTROSTOMY TUBE;  Surgeon: Stevie Herlene Righter, MD;  Location: [facility];  Service: General;  Laterality: N/A;   LEFT HEART CATH AND CORONARY ANGIOGRAPHY N/A 04/15/2021   Procedure: LEFT HEART CATH AND CORONARY ANGIOGRAPHY;  Surgeon: Court Dorn PARAS, MD;  Location: [facility] INVASIVE CV LAB;  Service: Cardiovascular;  Laterality: N/A;   MOUTH SURGERY     removed area which was benign   SCLEROTHERAPY  04/30/2023   Procedure: SCLEROTHERAPY;  Surgeon: Mansouraty, Aloha Raddle., MD;  Location: [facility];  Service: Gastroenterology;;   CARLS REMOVAL  07/08/2021   Procedure: STENT REMOVAL;  Surgeon: Wilhelmenia Aloha Raddle., MD;  Location: [facility];  Service: Gastroenterology;;   CARLS REMOVAL  10/16/2021   Procedure: STENT REMOVAL;  Surgeon: Wilhelmenia Aloha Raddle., MD;   Location: [facility];  Service: Gastroenterology;;   CARLS REMOVAL  08/14/2022   Procedure: STENT REMOVAL;  Surgeon: Wilhelmenia Aloha Raddle., MD;  Location: [facility];  Service: Gastroenterology;;   CARLS REMOVAL  11/28/2022   Procedure: STENT REMOVAL;  Surgeon: Wilhelmenia Aloha Raddle., MD;  Location: [facility] ENDOSCOPY;  Service: Gastroenterology;;   CARLS REMOVAL  04/11/2023   Procedure: STENT REMOVAL;  Surgeon: Albertus Gordy HERO, MD;  Location[facility];  Service: Gastroenterology;;   STERIOD INJECTION  01/23/2022   Procedure: STEROID INJECTION;  Surgeon: Wilhelmenia Aloha Raddle., MD;  Location: [facility] ENDOSCOPY;  Service: Gastroenterology;;   SUBMUCOSAL INJECTION  11/18/2021   Procedure: SUBMUCOSAL INJECTION;  Surgeon: Wilhelmenia Aloha Raddle., MD;  Location: [facility];  Service: Gastroenterology;;   SUBMUCOSAL INJECTION  08/14/2022   Procedure: SUBMUCOSAL INJECTION;  Surgeon: Wilhelmenia Aloha Raddle., MD;  Location: [facility];  Service: Gastroenterology;;   TONSILLECTOMY     UPPER GI ENDOSCOPY N/A 03/20/2021   Procedure: UPPER GI ENDOSCOPY;  Surgeon: Kinsinger, Herlene Righter, MD;  Location: [facility];  Service: General;  Laterality: N/A;    SOCIAL HISTORY:  Social History   Socioeconomic History  Marital status: Married    Spouse name: Not on file   Number of children: 3   Years of education: Not on file   Highest education level: Not on file  Occupational History   Occupation: IT It Trainer  Tobacco Use   Smoking status: Former    Current packs/day: 0.00    Average packs/day: 1 pack/day for 15.0 years (15.0 ttl pk-yrs)    Types: Cigarettes    Start date: 08/05/1974    Quit date: 08/05/1989    Years since quitting: 34.9   Smokeless tobacco: Never  Vaping Use   Vaping status: Never Used  Substance and Sexual Activity   Alcohol use: Not Currently   Drug use: Never   Sexual activity: Not Currently  Other Topics Concern   Not on file  Social History Narrative   Not on file    Social Drivers of Health   Financial Resource Strain: Low Risk  (09/30/2023)   Received from [facility]   Overall Financial Resource Strain (CARDIA)    Difficulty of Paying Living Expenses: Not hard at all  Food Insecurity: No Food Insecurity (09/30/2023)   Received from [facility]   Hunger Vital Sign    Within the past 12 months, you worried that your food would run out before you got the money to buy more.: Never true    Within the past 12 months, the food you bought just didn't last and you didn't have money to get more.: Never true  Transportation Needs: No Transportation Needs (09/30/2023)   Received from [facility] - Transportation    In the past 12 months, has lack of transportation kept you from medical appointments or from getting medications?: No    Lack of Transportation (Non-Medical): No  Physical Activity: Not on file  Stress: Not on file  Social Connections: Unknown (04/14/2023)   Received from [facility]   Social Network    Social Network: Not on file  Intimate Partner Violence: Unknown (04/14/2023)   Received from [facility]   HITS    Physically Hurt: Not on file    Insult or Talk Down To: Not on file    Threaten Physical Harm: Not on file    Scream or Curse: Not on file    FAMILY HISTORY:  Family History  Problem Relation Age of Onset   Hypertension Mother    Diabetes Mother    Heart attack Father 14   CAD Father    Hypertension Sister    Diabetes Other    Hypertension Other    Heart disease Other    Colon cancer Neg Hx    Esophageal cancer Neg Hx    Pancreatic cancer Neg Hx    Stomach cancer Neg Hx    Liver disease Neg Hx    Inflammatory bowel disease Neg Hx    Rectal cancer Neg Hx     CURRENT MEDICATIONS:  Outpatient Encounter Medications as of 07/19/2024  Medication Sig   cetirizine  (ZYRTEC ) 10 MG tablet Take 10 mg by mouth daily.   clopidogrel  (PLAVIX ) 75 MG tablet Take 1  tablet (75 mg total) by mouth daily.   Continuous Glucose Sensor (FREESTYLE LIBRE 14 DAY SENSOR) MISC Insert 1 sensor to the back of the arm as directed every 14 days for continuous glucose monitoring   fluticasone  (FLONASE ) 50 MCG/ACT nasal spray Place 2-3 sprays into both nostrils daily as needed for allergies or rhinitis.  insulin  lispro (HUMALOG ) 100 UNIT/ML KwikPen Inject subcutaneously 3 (three) times daily with meals Per sliding scale (Patient not taking: Reported on 07/12/2024)   Insulin  Pen Needle (UNIFINE PENTIPS) 32G X 4 MM MISC Use as needed as directed   mometasone  (ELOCON ) 0.1 % lotion Apply to affected areas once daily   Multiple Vitamin (MULTI-VITAMIN DAILY PO) Take 1 patch by mouth daily.   nitroGLYCERIN  (NITROSTAT ) 0.4 MG SL tablet Place 1 tablet (0.4 mg total) under the tongue every 5 (five) minutes as needed for chest pain.   propranolol  (INDERAL ) 20 MG tablet Take 1 tablet (20 mg total) by mouth 3 (three) times daily.   Propylene Glycol (SYSTANE BALANCE OP) Place 2-3 drops into both eyes daily as needed (dry eyes).   Pseudoeph-Doxylamine-DM-APAP (NYQUIL PO) Take 2 tablets by mouth at bedtime as needed (pain).   RABEprazole  (ACIPHEX ) 20 MG tablet Take 20 mg by mouth 2 (two) times daily.   No facility-administered encounter medications on file as of 07/19/2024.    ALLERGIES:  Allergies  Allergen Reactions   Other Itching and Other (See Comments)    Cats Wheezing, runny nose/eyes, congestion     LABORATORY DATA:  I have reviewed the labs as listed.  CBC    Component Value Date/Time   WBC 9.2 07/12/2024 0740   RBC 5.38 07/12/2024 0740   HGB 16.2 07/12/2024 0740   HCT 48.8 07/12/2024 0740   PLT 215 07/12/2024 0740   MCV 90.7 07/12/2024 0740   MCH 30.1 07/12/2024 0740   MCHC 33.2 07/12/2024 0740   RDW 14.0 07/12/2024 0740   LYMPHSABS 1.5 07/12/2024 0740   MONOABS 0.8 07/12/2024 0740   EOSABS 0.2 07/12/2024 0740   BASOSABS 0.0 07/12/2024 0740      Latest Ref  Rng & Units 07/12/2024   10:20 AM 06/29/2023    8:01 AM 06/04/2023    9:05 PM  CMP  Glucose 70 - 99 mg/dL 825  850  852   BUN 8 - 23 mg/dL 10  7  9    Creatinine 0.61 - 1.24 mg/dL 9.42  9.50  9.42   Sodium 135 - 145 mmol/L 139  136  135   Potassium 3.5 - 5.1 mmol/L 4.0  3.3  3.3   Chloride 98 - 111 mmol/L 100  100  98   CO2 22 - 32 mmol/L 30  29  27    Calcium  8.9 - 10.3 mg/dL 9.0  8.8  8.6   Total Protein 6.5 - 8.1 g/dL  6.4    Total Bilirubin <1.2 mg/dL  0.7    Alkaline Phos 38 - 126 U/L  64    AST 15 - 41 U/L  19    ALT 0 - 44 U/L  36      DIAGNOSTIC IMAGING:  I have independently reviewed the relevant imaging and discussed with the patient.   WRAP UP:  All questions were answered. The patient knows to call the clinic with any problems, questions or concerns.  Medical decision making: Low  Time spent on visit: I spent 15 minutes counseling the patient face to face. The total time spent in the appointment was 22 minutes and more than 50% was on counseling.  Pleasant CHRISTELLA Barefoot, PA-C  ***

## 2024-07-19 ENCOUNTER — Inpatient Hospital Stay: Admitting: Physician Assistant

## 2024-07-19 VITALS — BP 114/77 | HR 69 | Temp 98.2°F | Resp 16 | Wt 215.8 lb

## 2024-07-19 DIAGNOSIS — D508 Other iron deficiency anemias: Secondary | ICD-10-CM | POA: Diagnosis not present

## 2024-07-19 DIAGNOSIS — K9089 Other intestinal malabsorption: Secondary | ICD-10-CM

## 2024-07-20 ENCOUNTER — Ambulatory Visit (HOSPITAL_COMMUNITY)
Admission: RE | Admit: 2024-07-20 | Discharge: 2024-07-20 | Disposition: A | Discharge status: Home / Self Care | Source: Ambulatory Visit | Attending: Internal Medicine | Admitting: Internal Medicine

## 2024-07-20 DIAGNOSIS — I77819 Aortic ectasia, unspecified site: Secondary | ICD-10-CM | POA: Diagnosis present

## 2024-07-20 MED ORDER — IOHEXOL 350 MG/ML SOLN
75.0000 mL | Freq: Once | INTRAVENOUS | Status: AC
  Administered 2024-07-20: 75 mL via INTRAVENOUS

## 2024-07-27 ENCOUNTER — Other Ambulatory Visit: Payer: Self-pay | Admitting: Gastroenterology

## 2024-07-27 ENCOUNTER — Encounter: Payer: Self-pay | Admitting: Gastroenterology

## 2024-07-27 ENCOUNTER — Other Ambulatory Visit (HOSPITAL_COMMUNITY): Payer: Self-pay

## 2024-07-27 MED ORDER — RABEPRAZOLE SODIUM 20 MG PO TBEC
20.0000 mg | DELAYED_RELEASE_TABLET | Freq: Two times a day (BID) | ORAL | 6 refills | Status: AC
  Filled 2024-07-27: qty 60, 30d supply, fill #0
  Filled 2024-09-05 (×2): qty 60, 30d supply, fill #1

## 2024-07-28 ENCOUNTER — Other Ambulatory Visit: Payer: Self-pay

## 2024-07-28 ENCOUNTER — Encounter: Payer: Self-pay | Admitting: Gastroenterology

## 2024-07-28 ENCOUNTER — Other Ambulatory Visit (HOSPITAL_COMMUNITY): Payer: Self-pay

## 2024-07-28 DIAGNOSIS — I2510 Atherosclerotic heart disease of native coronary artery without angina pectoris: Secondary | ICD-10-CM

## 2024-07-28 DIAGNOSIS — E782 Mixed hyperlipidemia: Secondary | ICD-10-CM

## 2024-07-28 MED ORDER — ATORVASTATIN CALCIUM 10 MG PO TABS
10.0000 mg | ORAL_TABLET | Freq: Every day | ORAL | 3 refills | Status: AC
  Filled 2024-07-28: qty 90, 90d supply, fill #0

## 2024-07-28 NOTE — Addendum Note (Signed)
 Addended by: KENETH KNEE C on: 07/28/2024 09:11 AM   Modules accepted: Orders

## 2024-07-29 ENCOUNTER — Inpatient Hospital Stay

## 2024-07-29 ENCOUNTER — Other Ambulatory Visit (HOSPITAL_COMMUNITY)
Admission: RE | Admit: 2024-07-29 | Discharge: 2024-07-29 | Disposition: A | Discharge status: Home / Self Care | Source: Ambulatory Visit | Attending: Internal Medicine | Admitting: Internal Medicine

## 2024-07-29 ENCOUNTER — Ambulatory Visit: Payer: Self-pay | Admitting: Student

## 2024-07-29 VITALS — BP 142/85 | HR 70 | Temp 96.7°F | Resp 18

## 2024-07-29 DIAGNOSIS — I2510 Atherosclerotic heart disease of native coronary artery without angina pectoris: Secondary | ICD-10-CM | POA: Insufficient documentation

## 2024-07-29 DIAGNOSIS — E782 Mixed hyperlipidemia: Secondary | ICD-10-CM | POA: Insufficient documentation

## 2024-07-29 DIAGNOSIS — D508 Other iron deficiency anemias: Secondary | ICD-10-CM | POA: Diagnosis present

## 2024-07-29 DIAGNOSIS — Z79899 Other long term (current) drug therapy: Secondary | ICD-10-CM

## 2024-07-29 LAB — HEPATIC FUNCTION PANEL
ALT: 23 U/L (ref 0–44)
AST: 21 U/L (ref 15–41)
Albumin: 4.2 g/dL (ref 3.5–5.0)
Alkaline Phosphatase: 94 U/L (ref 38–126)
Bilirubin, Direct: 0.3 mg/dL — ABNORMAL HIGH (ref 0.0–0.2)
Indirect Bilirubin: 0.4 mg/dL (ref 0.3–0.9)
Total Bilirubin: 0.7 mg/dL (ref 0.0–1.2)
Total Protein: 7 g/dL (ref 6.5–8.1)

## 2024-07-29 LAB — LIPID PANEL
Cholesterol: 166 mg/dL (ref 0–200)
HDL: 31 mg/dL — ABNORMAL LOW (ref >40)
LDL Cholesterol: 102 mg/dL — ABNORMAL HIGH (ref 0–99)
Total CHOL/HDL Ratio: 5.4 ratio
Triglycerides: 167 mg/dL — ABNORMAL HIGH (ref <150)
VLDL: 33 mg/dL (ref 0–40)

## 2024-07-29 MED ORDER — ACETAMINOPHEN 325 MG PO TABS
650.0000 mg | ORAL_TABLET | Freq: Once | ORAL | Status: AC
  Administered 2024-07-29: 650 mg via ORAL
  Filled 2024-07-29: qty 2

## 2024-07-29 MED ORDER — IRON SUCROSE 400 MG IVPB - SIMPLE MED
400.0000 mg | Freq: Once | Status: AC
  Administered 2024-07-29: 400 mg via INTRAVENOUS
  Filled 2024-07-29: qty 400

## 2024-07-29 MED ORDER — SODIUM CHLORIDE 0.9 % IV SOLN: INTRAVENOUS | Status: DC

## 2024-07-29 MED ORDER — CETIRIZINE HCL 10 MG PO TABS
10.0000 mg | ORAL_TABLET | Freq: Once | ORAL | Status: AC
  Administered 2024-07-29: 10 mg via ORAL
  Filled 2024-07-29: qty 1

## 2024-07-29 NOTE — Telephone Encounter (Signed)
 The patient has been notified of the result and verbalized understanding.  All questions (if any) were answered. Bernett Dorothyann LABOR, RN 07/29/2024 10:06 AM    Repeat labs to be done at [facility] in 2-3 months   PCP copied

## 2024-07-29 NOTE — Progress Notes (Signed)
 Patient tolerated iron infusion with no complaints voiced.  Peripheral IV site clean and dry with good blood return noted before and after infusion.  Band aid applied.  VSS with discharge and left in satisfactory condition with no s/s of distress noted.

## 2024-07-29 NOTE — Patient Instructions (Signed)

## 2024-07-29 NOTE — Telephone Encounter (Signed)
-----   Message from Hector Curry sent at 07/29/2024  9:04 AM EST ----- Covering for Dr. Mallipeddi - He was supposed to have follow-up labs 2 months after starting Atorvastatin  but appears these were obtained today. At least we have an updated baseline. Total  cholesterol is at 166 and LDL is at 102 with normal AST and ALT. Goal LDL is less than 70 given his history of known coronary artery disease so would continue with plans to start Atorvastatin  10 mg  daily as recommended and still obtain a follow-up FLP and LFT's in 2 to 3 months. ----- Message ----- From: Interface, [facility] [place] Sent: 07/29/2024   8:50 AM EST To: Vishnu P Mallipeddi, MD

## 2024-08-04 ENCOUNTER — Ambulatory Visit: Payer: Self-pay | Admitting: Internal Medicine

## 2024-08-08 ENCOUNTER — Other Ambulatory Visit: Payer: Self-pay

## 2024-08-22 ENCOUNTER — Encounter: Payer: Self-pay | Admitting: *Deleted

## 2024-09-05 ENCOUNTER — Other Ambulatory Visit (HOSPITAL_COMMUNITY): Payer: Self-pay

## 2025-07-03 ENCOUNTER — Inpatient Hospital Stay

## 2025-07-11 ENCOUNTER — Inpatient Hospital Stay: Admitting: Physician Assistant
